# Patient Record
Sex: Female | Born: 1940 | Race: White | Hispanic: No | Smoking: Former smoker
Health system: Southern US, Community
[De-identification: ages and names within clinical notes are randomized; demographics above are authoritative.]

## PROBLEM LIST (undated history)

## (undated) DIAGNOSIS — F32A Depression, unspecified: Secondary | ICD-10-CM

## (undated) DIAGNOSIS — Z9289 Personal history of other medical treatment: Secondary | ICD-10-CM

## (undated) DIAGNOSIS — R0609 Other forms of dyspnea: Secondary | ICD-10-CM

## (undated) DIAGNOSIS — G4700 Insomnia, unspecified: Secondary | ICD-10-CM

## (undated) DIAGNOSIS — R195 Other fecal abnormalities: Secondary | ICD-10-CM

## (undated) DIAGNOSIS — M8580 Other specified disorders of bone density and structure, unspecified site: Secondary | ICD-10-CM

## (undated) DIAGNOSIS — F329 Major depressive disorder, single episode, unspecified: Secondary | ICD-10-CM

## (undated) DIAGNOSIS — D500 Iron deficiency anemia secondary to blood loss (chronic): Secondary | ICD-10-CM

## (undated) DIAGNOSIS — K635 Polyp of colon: Secondary | ICD-10-CM

## (undated) DIAGNOSIS — K5521 Angiodysplasia of colon with hemorrhage: Secondary | ICD-10-CM

## (undated) DIAGNOSIS — M1990 Unspecified osteoarthritis, unspecified site: Secondary | ICD-10-CM

## (undated) DIAGNOSIS — R0600 Dyspnea, unspecified: Secondary | ICD-10-CM

## (undated) DIAGNOSIS — I770 Arteriovenous fistula, acquired: Secondary | ICD-10-CM

## (undated) DIAGNOSIS — J189 Pneumonia, unspecified organism: Secondary | ICD-10-CM

## (undated) DIAGNOSIS — N289 Disorder of kidney and ureter, unspecified: Secondary | ICD-10-CM

## (undated) DIAGNOSIS — I10 Essential (primary) hypertension: Secondary | ICD-10-CM

## (undated) DIAGNOSIS — J449 Chronic obstructive pulmonary disease, unspecified: Secondary | ICD-10-CM

## (undated) DIAGNOSIS — Q2730 Arteriovenous malformation, site unspecified: Secondary | ICD-10-CM

## (undated) DIAGNOSIS — E785 Hyperlipidemia, unspecified: Secondary | ICD-10-CM

## (undated) DIAGNOSIS — D649 Anemia, unspecified: Secondary | ICD-10-CM

## (undated) DIAGNOSIS — E119 Type 2 diabetes mellitus without complications: Secondary | ICD-10-CM

## (undated) HISTORY — DX: Unspecified osteoarthritis, unspecified site: M19.90

## (undated) HISTORY — DX: Depression, unspecified: F32.A

## (undated) HISTORY — DX: Disorder of kidney and ureter, unspecified: N28.9

## (undated) HISTORY — DX: Arteriovenous fistula, acquired: I77.0

## (undated) HISTORY — DX: Type 2 diabetes mellitus without complications: E11.9

## (undated) HISTORY — PX: HYSTEROSCOPY: SHX211

## (undated) HISTORY — DX: Arteriovenous malformation, site unspecified: Q27.30

## (undated) HISTORY — DX: Insomnia, unspecified: G47.00

## (undated) HISTORY — DX: Dyspnea, unspecified: R06.00

## (undated) HISTORY — PX: APPENDECTOMY: SHX54

## (undated) HISTORY — PX: CYSTECTOMY: SUR359

## (undated) HISTORY — DX: Polyp of colon: K63.5

## (undated) HISTORY — DX: Other specified disorders of bone density and structure, unspecified site: M85.80

## (undated) HISTORY — DX: Angiodysplasia of colon with hemorrhage: K55.21

## (undated) HISTORY — DX: Other fecal abnormalities: R19.5

## (undated) HISTORY — DX: Chronic obstructive pulmonary disease, unspecified: J44.9

## (undated) HISTORY — DX: Other forms of dyspnea: R06.09

## (undated) HISTORY — DX: Iron deficiency anemia secondary to blood loss (chronic): D50.0

## (undated) HISTORY — DX: Essential (primary) hypertension: I10

## (undated) HISTORY — PX: AV FISTULA PLACEMENT: SHX1204

## (undated) HISTORY — DX: Anemia, unspecified: D64.9

## (undated) HISTORY — DX: Hyperlipidemia, unspecified: E78.5

---
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
## 2015-12-22 DIAGNOSIS — M8580 Other specified disorders of bone density and structure, unspecified site: Secondary | ICD-10-CM | POA: Insufficient documentation

## 2015-12-22 DIAGNOSIS — Z794 Long term (current) use of insulin: Secondary | ICD-10-CM

## 2015-12-22 DIAGNOSIS — Z79899 Other long term (current) drug therapy: Secondary | ICD-10-CM

## 2015-12-22 DIAGNOSIS — M1991 Primary osteoarthritis, unspecified site: Secondary | ICD-10-CM

## 2015-12-22 DIAGNOSIS — K5909 Other constipation: Secondary | ICD-10-CM | POA: Insufficient documentation

## 2015-12-22 DIAGNOSIS — G4700 Insomnia, unspecified: Secondary | ICD-10-CM | POA: Insufficient documentation

## 2015-12-22 DIAGNOSIS — K635 Polyp of colon: Secondary | ICD-10-CM | POA: Insufficient documentation

## 2015-12-22 DIAGNOSIS — F339 Major depressive disorder, recurrent, unspecified: Secondary | ICD-10-CM | POA: Insufficient documentation

## 2015-12-22 DIAGNOSIS — E785 Hyperlipidemia, unspecified: Secondary | ICD-10-CM

## 2015-12-22 HISTORY — DX: Other long term (current) drug therapy: Z79.899

## 2015-12-22 HISTORY — DX: Other constipation: K59.09

## 2015-12-22 HISTORY — DX: Primary osteoarthritis, unspecified site: M19.91

## 2015-12-22 HISTORY — DX: Hyperlipidemia, unspecified: E78.5

## 2015-12-22 HISTORY — DX: Major depressive disorder, recurrent, unspecified: F33.9

## 2015-12-22 HISTORY — DX: Long term (current) use of insulin: Z79.4

## 2015-12-23 DIAGNOSIS — R5383 Other fatigue: Secondary | ICD-10-CM | POA: Insufficient documentation

## 2015-12-23 DIAGNOSIS — R5381 Other malaise: Secondary | ICD-10-CM | POA: Insufficient documentation

## 2015-12-23 DIAGNOSIS — E785 Hyperlipidemia, unspecified: Secondary | ICD-10-CM | POA: Insufficient documentation

## 2015-12-23 DIAGNOSIS — N186 End stage renal disease: Secondary | ICD-10-CM

## 2015-12-23 DIAGNOSIS — E1169 Type 2 diabetes mellitus with other specified complication: Secondary | ICD-10-CM | POA: Insufficient documentation

## 2015-12-23 DIAGNOSIS — E1122 Type 2 diabetes mellitus with diabetic chronic kidney disease: Secondary | ICD-10-CM

## 2015-12-23 HISTORY — DX: End stage renal disease: E11.22

## 2015-12-23 HISTORY — DX: Other malaise: R53.81

## 2015-12-23 HISTORY — DX: Other malaise: R53.83

## 2015-12-23 HISTORY — DX: Type 2 diabetes mellitus with diabetic chronic kidney disease: N18.6

## 2017-01-14 DIAGNOSIS — E785 Hyperlipidemia, unspecified: Secondary | ICD-10-CM | POA: Diagnosis not present

## 2017-01-14 DIAGNOSIS — M1990 Unspecified osteoarthritis, unspecified site: Secondary | ICD-10-CM | POA: Diagnosis not present

## 2017-01-14 DIAGNOSIS — E119 Type 2 diabetes mellitus without complications: Secondary | ICD-10-CM

## 2017-01-14 DIAGNOSIS — J159 Unspecified bacterial pneumonia: Secondary | ICD-10-CM | POA: Diagnosis not present

## 2017-01-14 DIAGNOSIS — J441 Chronic obstructive pulmonary disease with (acute) exacerbation: Secondary | ICD-10-CM | POA: Diagnosis not present

## 2017-01-14 DIAGNOSIS — I10 Essential (primary) hypertension: Secondary | ICD-10-CM | POA: Diagnosis not present

## 2017-01-14 DIAGNOSIS — E871 Hypo-osmolality and hyponatremia: Secondary | ICD-10-CM | POA: Diagnosis not present

## 2017-01-14 DIAGNOSIS — J9601 Acute respiratory failure with hypoxia: Secondary | ICD-10-CM | POA: Diagnosis not present

## 2017-01-15 DIAGNOSIS — J9601 Acute respiratory failure with hypoxia: Secondary | ICD-10-CM | POA: Diagnosis not present

## 2017-01-15 DIAGNOSIS — J441 Chronic obstructive pulmonary disease with (acute) exacerbation: Secondary | ICD-10-CM | POA: Diagnosis not present

## 2017-01-15 DIAGNOSIS — E871 Hypo-osmolality and hyponatremia: Secondary | ICD-10-CM | POA: Diagnosis not present

## 2017-01-15 DIAGNOSIS — J159 Unspecified bacterial pneumonia: Secondary | ICD-10-CM | POA: Diagnosis not present

## 2017-01-16 DIAGNOSIS — J159 Unspecified bacterial pneumonia: Secondary | ICD-10-CM | POA: Diagnosis not present

## 2017-01-16 DIAGNOSIS — E871 Hypo-osmolality and hyponatremia: Secondary | ICD-10-CM | POA: Diagnosis not present

## 2017-01-16 DIAGNOSIS — J441 Chronic obstructive pulmonary disease with (acute) exacerbation: Secondary | ICD-10-CM | POA: Diagnosis not present

## 2017-01-16 DIAGNOSIS — J9601 Acute respiratory failure with hypoxia: Secondary | ICD-10-CM | POA: Diagnosis not present

## 2017-01-17 DIAGNOSIS — J159 Unspecified bacterial pneumonia: Secondary | ICD-10-CM | POA: Diagnosis not present

## 2017-01-17 DIAGNOSIS — E871 Hypo-osmolality and hyponatremia: Secondary | ICD-10-CM | POA: Diagnosis not present

## 2017-01-17 DIAGNOSIS — J441 Chronic obstructive pulmonary disease with (acute) exacerbation: Secondary | ICD-10-CM | POA: Diagnosis not present

## 2017-01-17 DIAGNOSIS — J9601 Acute respiratory failure with hypoxia: Secondary | ICD-10-CM | POA: Diagnosis not present

## 2017-01-18 DIAGNOSIS — J9601 Acute respiratory failure with hypoxia: Secondary | ICD-10-CM | POA: Diagnosis not present

## 2017-01-18 DIAGNOSIS — J441 Chronic obstructive pulmonary disease with (acute) exacerbation: Secondary | ICD-10-CM | POA: Diagnosis not present

## 2017-01-18 DIAGNOSIS — J159 Unspecified bacterial pneumonia: Secondary | ICD-10-CM | POA: Diagnosis not present

## 2017-01-18 DIAGNOSIS — E871 Hypo-osmolality and hyponatremia: Secondary | ICD-10-CM | POA: Diagnosis not present

## 2017-01-19 DIAGNOSIS — E871 Hypo-osmolality and hyponatremia: Secondary | ICD-10-CM | POA: Diagnosis not present

## 2017-01-19 DIAGNOSIS — J441 Chronic obstructive pulmonary disease with (acute) exacerbation: Secondary | ICD-10-CM | POA: Diagnosis not present

## 2017-01-19 DIAGNOSIS — J9601 Acute respiratory failure with hypoxia: Secondary | ICD-10-CM | POA: Diagnosis not present

## 2017-01-19 DIAGNOSIS — J159 Unspecified bacterial pneumonia: Secondary | ICD-10-CM | POA: Diagnosis not present

## 2017-01-23 DIAGNOSIS — D631 Anemia in chronic kidney disease: Secondary | ICD-10-CM

## 2017-01-23 DIAGNOSIS — D638 Anemia in other chronic diseases classified elsewhere: Secondary | ICD-10-CM

## 2017-01-23 DIAGNOSIS — N189 Chronic kidney disease, unspecified: Secondary | ICD-10-CM | POA: Insufficient documentation

## 2017-01-23 HISTORY — DX: Anemia in other chronic diseases classified elsewhere: D63.8

## 2017-01-23 HISTORY — DX: Chronic kidney disease, unspecified: D63.1

## 2017-02-02 DIAGNOSIS — I5030 Unspecified diastolic (congestive) heart failure: Secondary | ICD-10-CM

## 2017-02-02 HISTORY — DX: Unspecified diastolic (congestive) heart failure: I50.30

## 2017-02-17 DIAGNOSIS — J370 Chronic laryngitis: Secondary | ICD-10-CM | POA: Insufficient documentation

## 2017-02-17 HISTORY — DX: Chronic laryngitis: J37.0

## 2018-09-15 DIAGNOSIS — I120 Hypertensive chronic kidney disease with stage 5 chronic kidney disease or end stage renal disease: Secondary | ICD-10-CM

## 2018-09-15 HISTORY — DX: Chronic kidney disease, stage 5: I12.0

## 2018-12-23 ENCOUNTER — Encounter (INDEPENDENT_AMBULATORY_CARE_PROVIDER_SITE_OTHER): Payer: Medicare Other | Admitting: Ophthalmology

## 2018-12-23 DIAGNOSIS — E11311 Type 2 diabetes mellitus with unspecified diabetic retinopathy with macular edema: Secondary | ICD-10-CM

## 2018-12-23 DIAGNOSIS — H2513 Age-related nuclear cataract, bilateral: Secondary | ICD-10-CM

## 2018-12-23 DIAGNOSIS — E113513 Type 2 diabetes mellitus with proliferative diabetic retinopathy with macular edema, bilateral: Secondary | ICD-10-CM | POA: Diagnosis not present

## 2018-12-23 DIAGNOSIS — H35033 Hypertensive retinopathy, bilateral: Secondary | ICD-10-CM

## 2018-12-23 DIAGNOSIS — I10 Essential (primary) hypertension: Secondary | ICD-10-CM

## 2018-12-23 DIAGNOSIS — H4312 Vitreous hemorrhage, left eye: Secondary | ICD-10-CM

## 2018-12-23 DIAGNOSIS — H43813 Vitreous degeneration, bilateral: Secondary | ICD-10-CM

## 2019-01-19 ENCOUNTER — Encounter (INDEPENDENT_AMBULATORY_CARE_PROVIDER_SITE_OTHER): Payer: Medicare Other | Admitting: Ophthalmology

## 2019-02-16 ENCOUNTER — Encounter (INDEPENDENT_AMBULATORY_CARE_PROVIDER_SITE_OTHER): Payer: Medicare Other | Admitting: Ophthalmology

## 2019-03-06 ENCOUNTER — Encounter (INDEPENDENT_AMBULATORY_CARE_PROVIDER_SITE_OTHER): Payer: Medicare Other | Admitting: Ophthalmology

## 2019-03-11 DIAGNOSIS — Z794 Long term (current) use of insulin: Secondary | ICD-10-CM | POA: Insufficient documentation

## 2019-03-11 DIAGNOSIS — E871 Hypo-osmolality and hyponatremia: Secondary | ICD-10-CM | POA: Insufficient documentation

## 2019-03-11 DIAGNOSIS — I5033 Acute on chronic diastolic (congestive) heart failure: Secondary | ICD-10-CM

## 2019-03-11 DIAGNOSIS — E119 Type 2 diabetes mellitus without complications: Secondary | ICD-10-CM | POA: Insufficient documentation

## 2019-03-11 DIAGNOSIS — E876 Hypokalemia: Secondary | ICD-10-CM

## 2019-03-11 DIAGNOSIS — R0902 Hypoxemia: Secondary | ICD-10-CM

## 2019-03-11 HISTORY — DX: Acute on chronic diastolic (congestive) heart failure: I50.33

## 2019-03-11 HISTORY — DX: Type 2 diabetes mellitus without complications: E11.9

## 2019-03-11 HISTORY — DX: Hypoxemia: R09.02

## 2019-03-11 HISTORY — DX: Hypokalemia: E87.6

## 2019-03-11 HISTORY — DX: Hypo-osmolality and hyponatremia: E87.1

## 2019-03-12 DIAGNOSIS — I5021 Acute systolic (congestive) heart failure: Secondary | ICD-10-CM

## 2019-03-12 HISTORY — DX: Acute systolic (congestive) heart failure: I50.21

## 2019-03-29 DIAGNOSIS — E222 Syndrome of inappropriate secretion of antidiuretic hormone: Secondary | ICD-10-CM

## 2019-03-29 HISTORY — DX: Syndrome of inappropriate secretion of antidiuretic hormone: E22.2

## 2019-04-19 ENCOUNTER — Encounter (INDEPENDENT_AMBULATORY_CARE_PROVIDER_SITE_OTHER): Payer: Medicare Other | Admitting: Ophthalmology

## 2019-04-19 ENCOUNTER — Other Ambulatory Visit: Payer: Self-pay

## 2019-04-19 DIAGNOSIS — E113511 Type 2 diabetes mellitus with proliferative diabetic retinopathy with macular edema, right eye: Secondary | ICD-10-CM | POA: Diagnosis not present

## 2019-04-19 DIAGNOSIS — H35033 Hypertensive retinopathy, bilateral: Secondary | ICD-10-CM

## 2019-04-19 DIAGNOSIS — I10 Essential (primary) hypertension: Secondary | ICD-10-CM | POA: Diagnosis not present

## 2019-04-19 DIAGNOSIS — E11311 Type 2 diabetes mellitus with unspecified diabetic retinopathy with macular edema: Secondary | ICD-10-CM | POA: Diagnosis not present

## 2019-04-19 DIAGNOSIS — H43813 Vitreous degeneration, bilateral: Secondary | ICD-10-CM

## 2019-04-19 DIAGNOSIS — E113592 Type 2 diabetes mellitus with proliferative diabetic retinopathy without macular edema, left eye: Secondary | ICD-10-CM

## 2019-05-01 ENCOUNTER — Encounter (INDEPENDENT_AMBULATORY_CARE_PROVIDER_SITE_OTHER): Payer: Medicare Other | Admitting: Ophthalmology

## 2019-05-01 ENCOUNTER — Other Ambulatory Visit: Payer: Self-pay

## 2019-05-01 DIAGNOSIS — E113592 Type 2 diabetes mellitus with proliferative diabetic retinopathy without macular edema, left eye: Secondary | ICD-10-CM | POA: Diagnosis not present

## 2019-05-01 DIAGNOSIS — E11319 Type 2 diabetes mellitus with unspecified diabetic retinopathy without macular edema: Secondary | ICD-10-CM

## 2019-05-17 ENCOUNTER — Encounter (INDEPENDENT_AMBULATORY_CARE_PROVIDER_SITE_OTHER): Payer: Medicare Other | Admitting: Ophthalmology

## 2019-05-17 ENCOUNTER — Other Ambulatory Visit: Payer: Self-pay

## 2019-05-17 DIAGNOSIS — I10 Essential (primary) hypertension: Secondary | ICD-10-CM | POA: Diagnosis not present

## 2019-05-17 DIAGNOSIS — H35033 Hypertensive retinopathy, bilateral: Secondary | ICD-10-CM

## 2019-05-17 DIAGNOSIS — E113513 Type 2 diabetes mellitus with proliferative diabetic retinopathy with macular edema, bilateral: Secondary | ICD-10-CM

## 2019-05-17 DIAGNOSIS — H43813 Vitreous degeneration, bilateral: Secondary | ICD-10-CM

## 2019-05-17 DIAGNOSIS — E11311 Type 2 diabetes mellitus with unspecified diabetic retinopathy with macular edema: Secondary | ICD-10-CM

## 2019-06-13 ENCOUNTER — Encounter (INDEPENDENT_AMBULATORY_CARE_PROVIDER_SITE_OTHER): Payer: Medicare Other | Admitting: Ophthalmology

## 2019-06-21 ENCOUNTER — Encounter (INDEPENDENT_AMBULATORY_CARE_PROVIDER_SITE_OTHER): Payer: Medicare Other | Admitting: Ophthalmology

## 2019-07-04 ENCOUNTER — Encounter (INDEPENDENT_AMBULATORY_CARE_PROVIDER_SITE_OTHER): Payer: Medicare Other | Admitting: Ophthalmology

## 2019-07-11 ENCOUNTER — Encounter (INDEPENDENT_AMBULATORY_CARE_PROVIDER_SITE_OTHER): Payer: Medicare Other | Admitting: Ophthalmology

## 2019-07-11 ENCOUNTER — Other Ambulatory Visit: Payer: Self-pay

## 2019-07-11 DIAGNOSIS — I10 Essential (primary) hypertension: Secondary | ICD-10-CM | POA: Diagnosis not present

## 2019-07-11 DIAGNOSIS — H35033 Hypertensive retinopathy, bilateral: Secondary | ICD-10-CM | POA: Diagnosis not present

## 2019-07-11 DIAGNOSIS — E11311 Type 2 diabetes mellitus with unspecified diabetic retinopathy with macular edema: Secondary | ICD-10-CM | POA: Diagnosis not present

## 2019-07-11 DIAGNOSIS — E113513 Type 2 diabetes mellitus with proliferative diabetic retinopathy with macular edema, bilateral: Secondary | ICD-10-CM | POA: Diagnosis not present

## 2019-07-11 DIAGNOSIS — H43813 Vitreous degeneration, bilateral: Secondary | ICD-10-CM

## 2019-07-18 DIAGNOSIS — N186 End stage renal disease: Secondary | ICD-10-CM

## 2019-07-18 DIAGNOSIS — N185 Chronic kidney disease, stage 5: Secondary | ICD-10-CM

## 2019-07-18 HISTORY — DX: End stage renal disease: N18.6

## 2019-07-18 HISTORY — DX: Chronic kidney disease, stage 5: N18.5

## 2019-08-09 ENCOUNTER — Encounter (INDEPENDENT_AMBULATORY_CARE_PROVIDER_SITE_OTHER): Payer: Medicare Other | Admitting: Ophthalmology

## 2019-08-21 ENCOUNTER — Encounter (INDEPENDENT_AMBULATORY_CARE_PROVIDER_SITE_OTHER): Payer: Medicare Other | Admitting: Ophthalmology

## 2019-08-28 ENCOUNTER — Other Ambulatory Visit: Payer: Self-pay

## 2019-08-28 ENCOUNTER — Encounter (INDEPENDENT_AMBULATORY_CARE_PROVIDER_SITE_OTHER): Payer: Medicare Other | Admitting: Ophthalmology

## 2019-08-28 DIAGNOSIS — H43813 Vitreous degeneration, bilateral: Secondary | ICD-10-CM

## 2019-08-28 DIAGNOSIS — I10 Essential (primary) hypertension: Secondary | ICD-10-CM | POA: Diagnosis not present

## 2019-08-28 DIAGNOSIS — E113513 Type 2 diabetes mellitus with proliferative diabetic retinopathy with macular edema, bilateral: Secondary | ICD-10-CM

## 2019-08-28 DIAGNOSIS — E11311 Type 2 diabetes mellitus with unspecified diabetic retinopathy with macular edema: Secondary | ICD-10-CM

## 2019-08-28 DIAGNOSIS — H35033 Hypertensive retinopathy, bilateral: Secondary | ICD-10-CM

## 2019-09-05 ENCOUNTER — Other Ambulatory Visit: Payer: Self-pay

## 2019-09-05 ENCOUNTER — Encounter (INDEPENDENT_AMBULATORY_CARE_PROVIDER_SITE_OTHER): Payer: Medicare Other | Admitting: Ophthalmology

## 2019-09-05 DIAGNOSIS — E11311 Type 2 diabetes mellitus with unspecified diabetic retinopathy with macular edema: Secondary | ICD-10-CM | POA: Diagnosis not present

## 2019-09-05 DIAGNOSIS — E113511 Type 2 diabetes mellitus with proliferative diabetic retinopathy with macular edema, right eye: Secondary | ICD-10-CM

## 2019-09-18 DIAGNOSIS — N186 End stage renal disease: Secondary | ICD-10-CM

## 2019-09-18 HISTORY — DX: End stage renal disease: N18.6

## 2019-09-25 ENCOUNTER — Encounter (INDEPENDENT_AMBULATORY_CARE_PROVIDER_SITE_OTHER): Payer: Medicare Other | Admitting: Ophthalmology

## 2019-09-25 ENCOUNTER — Other Ambulatory Visit: Payer: Self-pay

## 2019-09-25 DIAGNOSIS — I10 Essential (primary) hypertension: Secondary | ICD-10-CM | POA: Diagnosis not present

## 2019-09-25 DIAGNOSIS — E113513 Type 2 diabetes mellitus with proliferative diabetic retinopathy with macular edema, bilateral: Secondary | ICD-10-CM | POA: Diagnosis not present

## 2019-09-25 DIAGNOSIS — H35033 Hypertensive retinopathy, bilateral: Secondary | ICD-10-CM

## 2019-09-25 DIAGNOSIS — E11311 Type 2 diabetes mellitus with unspecified diabetic retinopathy with macular edema: Secondary | ICD-10-CM

## 2019-09-25 DIAGNOSIS — H43813 Vitreous degeneration, bilateral: Secondary | ICD-10-CM

## 2019-09-25 DIAGNOSIS — H2513 Age-related nuclear cataract, bilateral: Secondary | ICD-10-CM

## 2019-10-30 ENCOUNTER — Encounter (INDEPENDENT_AMBULATORY_CARE_PROVIDER_SITE_OTHER): Payer: Medicare Other | Admitting: Ophthalmology

## 2019-11-02 DIAGNOSIS — R0600 Dyspnea, unspecified: Secondary | ICD-10-CM | POA: Insufficient documentation

## 2019-11-02 DIAGNOSIS — I770 Arteriovenous fistula, acquired: Secondary | ICD-10-CM

## 2019-11-02 HISTORY — DX: Dyspnea, unspecified: R06.00

## 2019-11-02 HISTORY — DX: Arteriovenous fistula, acquired: I77.0

## 2019-11-08 ENCOUNTER — Encounter (INDEPENDENT_AMBULATORY_CARE_PROVIDER_SITE_OTHER): Payer: Medicare Other | Admitting: Ophthalmology

## 2019-11-08 ENCOUNTER — Other Ambulatory Visit: Payer: Self-pay

## 2019-11-08 DIAGNOSIS — I10 Essential (primary) hypertension: Secondary | ICD-10-CM | POA: Diagnosis not present

## 2019-11-08 DIAGNOSIS — H43813 Vitreous degeneration, bilateral: Secondary | ICD-10-CM

## 2019-11-08 DIAGNOSIS — H35033 Hypertensive retinopathy, bilateral: Secondary | ICD-10-CM

## 2019-11-08 DIAGNOSIS — E11311 Type 2 diabetes mellitus with unspecified diabetic retinopathy with macular edema: Secondary | ICD-10-CM | POA: Diagnosis not present

## 2019-11-08 DIAGNOSIS — E113513 Type 2 diabetes mellitus with proliferative diabetic retinopathy with macular edema, bilateral: Secondary | ICD-10-CM

## 2019-11-13 ENCOUNTER — Ambulatory Visit: Payer: Medicare Other | Admitting: Cardiology

## 2019-11-13 ENCOUNTER — Ambulatory Visit (INDEPENDENT_AMBULATORY_CARE_PROVIDER_SITE_OTHER): Payer: Medicare Other | Admitting: Cardiology

## 2019-11-13 ENCOUNTER — Encounter: Payer: Self-pay | Admitting: *Deleted

## 2019-11-13 VITALS — BP 166/60 | HR 69 | Ht 62.0 in | Wt 165.0 lb

## 2019-11-13 DIAGNOSIS — E8779 Other fluid overload: Secondary | ICD-10-CM

## 2019-11-13 DIAGNOSIS — N185 Chronic kidney disease, stage 5: Secondary | ICD-10-CM | POA: Diagnosis not present

## 2019-11-13 DIAGNOSIS — I5033 Acute on chronic diastolic (congestive) heart failure: Secondary | ICD-10-CM | POA: Diagnosis not present

## 2019-11-13 DIAGNOSIS — E8770 Fluid overload, unspecified: Secondary | ICD-10-CM | POA: Insufficient documentation

## 2019-11-13 HISTORY — DX: Fluid overload, unspecified: E87.70

## 2019-11-13 NOTE — Progress Notes (Signed)
Cardiology Office Note:    Date:  11/13/2019   ID:  Corky Downs, DOB February 10, 1941, MRN [id number]  PCP:  Raina Mina., MD  Cardiologist:  Berniece Salines, DO  Electrophysiologist:  None   Referring MD: Gweneth Fritter, FNP   Chief Complaint  Patient presents with  . New Patient (Initial Visit)    CHF    History of Present Illness:    Anna Carr is a 79 y.o. female with a hx of hypertension, diabetes, diastolic heart failure echocardiogram in May 2020 reports a EF of 55-60% with noted grade 2 diastolic dysfunction, chronic kidney disease not on dialysis but has a new fistula that has not been used. The patient was recently treated at the ER at [facility].  Per PCP note she was given 80 Lasix IV and later she did get Bumex she was diuresed over 600 cc prior to her discharge. During her visit with her PCP was recommended the patient see cardiology.  She is here today and tells me that she has been experiencing significant shortness of breath, orthopnea and PND.  She has had worsening leg edema.  She was started on Lasix 80 mg daily which had been eventually titrated up to Lasix 80 mg every 8 hours and she is getting no relief.  Her shortness of breath is getting worse. Denies any chest pain, lightheadedness dizziness or nausea.  Past Medical History:  Diagnosis Date  . Anemia   . AV fistula ([place])   . Colon polyp   . COPD mixed type ([place])   . Depression   . Dyspnea on exertion   . Hyperlipidemia   . Hypertension   . Insomnia   . Kidney disease   . Osteoarthritis   . Osteopenia   . Type 2 diabetes mellitus ([place])     Past Surgical History:  Procedure Laterality Date  . APPENDECTOMY    . AV FISTULA PLACEMENT Left   . HYSTEROSCOPY      Current Medications: Current Meds  Medication Sig  . acetaminophen (TYLENOL) 500 MG tablet Take by mouth.  Marland Kitchen amLODipine (NORVASC) 10 MG tablet TAKE 1 TABLET BY MOUTH  DAILY  . ascorbic acid (VITAMIN C) 500 MG  tablet Take by mouth.  Marland Kitchen aspirin 81 MG EC tablet Take by mouth.  Marland Kitchen BACILLUS COAGULANS-INULIN PO Takes one capsule daily  . bisacodyl (DULCOLAX) 5 MG EC tablet Take by mouth.  . carvedilol (COREG) 6.25 MG tablet Take by mouth.  . Cholecalciferol 1.25 MG (50000 UT) TABS Take by mouth.  . cyanocobalamin 50 MCG tablet Take by mouth.  . furosemide (LASIX) 80 MG tablet Take 80 mg by mouth twice daily or as directed..  . glimepiride (AMARYL) 4 MG tablet Take by mouth 2 (two) times daily as needed.   . hydrALAZINE (APRESOLINE) 100 MG tablet Take by mouth.  . Insulin Glargine-Lixisenatide (SOLIQUA) 100-33 UNT-MCG/ML SOPN Inject 17 units once a day with additional as needed.  MDD: 50 units/day  . Omega-3 Fatty Acids (FISH OIL) 1000 MG CAPS 2 times daily.  . potassium chloride SA (KLOR-CON) 20 MEQ tablet Take by mouth.  . psyllium (REGULOID) 0.52 g capsule Take by mouth.  . Vitamin E 400 units TABS Take by mouth.     Allergies:   Patient has no known allergies.   Social History   Socioeconomic History  . Marital status: Widowed    Spouse name: Not on file  . Number of children: Not on file  .  Years of education: Not on file  . Highest education level: Not on file  Occupational History  . Not on file  Tobacco Use  . Smoking status: Former Smoker    Types: Cigarettes  . Smokeless tobacco: Never Used  Substance and Sexual Activity  . Alcohol use: Not Currently  . Drug use: Never  . Sexual activity: Not on file  Other Topics Concern  . Not on file  Social History Narrative  . Not on file   Social Determinants of Health   Financial Resource Strain:   . Difficulty of Paying Living Expenses: Not on file  Food Insecurity:   . Worried About Charity fundraiser in the Last Year: Not on file  . Ran Out of Food in the Last Year: Not on file  Transportation Needs:   . Lack of Transportation (Medical): Not on file  . Lack of Transportation (Non-Medical): Not on file  Physical Activity:    . Days of Exercise per Week: Not on file  . Minutes of Exercise per Session: Not on file  Stress:   . Feeling of Stress : Not on file  Social Connections:   . Frequency of Communication with Friends and Family: Not on file  . Frequency of Social Gatherings with Friends and Family: Not on file  . Attends Religious Services: Not on file  . Active Member of Clubs or Organizations: Not on file  . Attends Archivist Meetings: Not on file  . Marital Status: Not on file     Family History: The patient's family history includes Diabetes in her maternal grandfather and mother; Hypertension in her mother.  ROS:   Review of Systems  Constitution: Negative for decreased appetite, fever and weight gain.  HENT: Negative for congestion, ear discharge, hoarse voice and sore throat.   Eyes: Negative for discharge, redness, vision loss in right eye and visual halos.  Cardiovascular: Negative for chest pain, dyspnea on exertion, leg swelling, orthopnea and palpitations.  Respiratory: Negative for cough, hemoptysis, shortness of breath and snoring.   Endocrine: Negative for heat intolerance and polyphagia.  Hematologic/Lymphatic: Negative for bleeding problem. Does not bruise/bleed easily.  Skin: Negative for flushing, nail changes, rash and suspicious lesions.  Musculoskeletal: Negative for arthritis, joint pain, muscle cramps, myalgias, neck pain and stiffness.  Gastrointestinal: Negative for abdominal pain, bowel incontinence, diarrhea and excessive appetite.  Genitourinary: Negative for decreased libido, genital sores and incomplete emptying.  Neurological: Negative for brief paralysis, focal weakness, headaches and loss of balance.  Psychiatric/Behavioral: Negative for altered mental status, depression and suicidal ideas.  Allergic/Immunologic: Negative for HIV exposure and persistent infections.    EKGs/Labs/Other Studies Reviewed:    The following studies were reviewed today:    EKG:  The ekg ordered today demonstrates sinus rhythm, heart rate 69 bpm with nonspecific ST changes, no prior EKG for comparison.  Per PCP note she had an echocardiogram done in May 2020 which showed evidence of ejection fraction 55 to 60%.  She had normal right ventricular systolic function.  Grade 2 diastolic dysfunction.  There was evidence of mitral annular calcification with mild mitral vegetation.  Dilated left atrium as well as aortic sclerosis with mild to moderate pulmonary artery systolic pressure reported.  The images are unavailable at this time.  Recent Labs: No results found for requested labs within last 8760 hours.  Recent Lipid Panel No results found for: CHOL, TRIG, HDL, CHOLHDL, VLDL, LDLCALC, LDLDIRECT  Physical Exam:    VS:  BP (!) 166/60   Pulse 69   Ht 5\' 2"  (1.575 m)   Wt 165 lb (74.8 kg)   SpO2 95%   BMI 30.18 kg/m     Wt Readings from Last 3 Encounters:  11/13/19 165 lb (74.8 kg)     GEN: Well nourished, well developed in no acute distress HEENT: Normal NECK: JVD noted,  No carotid bruits LYMPHATICS: No lymphadenopathy CARDIAC: S1S2 noted,RRR, no murmurs, rubs, gallops RESPIRATORY: Bibasilar crackles ABDOMEN: Soft, non-tender, non-distended, +bowel sounds, no guarding. EXTREMITIES: bilateral +2 edema, No cyanosis, no clubbing MUSCULOSKELETAL:  No edema; No deformity  SKIN: Warm and dry NEUROLOGIC:  Alert and oriented x 3, non-focal PSYCHIATRIC:  Normal affect, good insight  ASSESSMENT:    1. Acute on chronic diastolic congestive heart failure ([place])   2. Volume overload   3. CKD (chronic kidney disease), stage V ([name])    PLAN:    Likely she does appear volume overloaded.  Which could be in the setting of her chronic kidney disease first as well as her diastolic heart failure.  At this point she has failed outpatient diuretic therapy.  I do suggest that the patient be admitted for inpatient IV diuretics.  Ideally what she needs is he hemodialysis  as she is significantly overloaded.  I spoken to the patient and her daughter about this.  I asked the patient to go to the [facility] emergency department but she has deferred and preferred to go to [facility] ED.   She certainly will need a echocardiogram as well as an ischemic evaluation but for now getting the patient euvolemic is prior to and this was cussed with the patient and her daughter.   The patient is in agreement with the above plan. The patient left the office in stable condition.  The patient will follow up in 2 weeks or sooner if needed   Medication Adjustments/Labs and Tests Ordered: Current medicines are reviewed at length with the patient today.  Concerns regarding medicines are outlined above.  Orders Placed This Encounter  Procedures  . MYOCARDIAL PERFUSION IMAGING  . EKG 12-Lead  . ECHOCARDIOGRAM COMPLETE   No orders of the defined types were placed in this encounter.   Patient Instructions  Medication Instructions:  Your physician recommends that you continue on your current medications as directed. Please refer to the Current Medication list given to you today.  *If you need a refill on your cardiac medications before your next appointment, please call your pharmacy*  Lab Work: None If you have labs (blood work) drawn today and your tests are completely normal, you will receive your results only by: Marland Kitchen MyChart Message (if you have MyChart) OR . A paper copy in the mail If you have any lab test that is abnormal or we need to change your treatment, we will call you to review the results.  Testing/Procedures: Your physician has requested that you have an echocardiogram. Echocardiography is a painless test that uses sound waves to create images of your heart. It provides your doctor with information about the size and shape of your heart and how well your heart's chambers and valves are working. This procedure takes approximately one hour. There are no restrictions  for this procedure.  Your physician has requested that you have a lexiscan myoview. For further information please visit [email or web address]. Please follow instruction sheet, as given.   Follow-Up: At [facility], you and your health needs are our priority.  As part of our continuing mission to  provide you with exceptional heart care, we have created designated Provider Care Teams.  These Care Teams include your primary Cardiologist (physician) and Advanced Practice Providers (APPs -  Physician Assistants and Nurse Practitioners) who all work together to provide you with the care you need, when you need it.  Your next appointment:   2 week(s)  The format for your next appointment:   In Person  Provider:   Berniece Salines, DO  Other Instructions      Adopting a Healthy Lifestyle.  Know what a healthy weight is for you (roughly BMI <25) and aim to maintain this   Aim for 7+ servings of fruits and vegetables daily   65-80+ fluid ounces of water or unsweet tea for healthy kidneys   Limit to max 1 drink of alcohol per day; avoid smoking/tobacco   Limit animal fats in diet for cholesterol and heart health - choose grass fed whenever available   Avoid highly processed foods, and foods high in saturated/trans fats   Aim for low stress - take time to unwind and care for your mental health   Aim for 150 min of moderate intensity exercise weekly for heart health, and weights twice weekly for bone health   Aim for 7-9 hours of sleep daily   When it comes to diets, agreement about the perfect plan isnt easy to find, even among the experts. Experts at the [employer] developed an idea known as the Healthy Eating Plate. Just imagine a plate divided into logical, healthy portions.   The emphasis is on diet quality:   Load up on vegetables and fruits - one-half of your plate: Aim for color and variety, and remember that potatoes dont count.   Go for whole grains -  one-quarter of your plate: Whole wheat, barley, wheat berries, quinoa, oats, brown rice, and foods made with them. If you want pasta, go with whole wheat pasta.   Protein power - one-quarter of your plate: Fish, chicken, beans, and nuts are all healthy, versatile protein sources. Limit red meat.   The diet, however, does go beyond the plate, offering a few other suggestions.   Use healthy plant oils, such as olive, canola, soy, corn, sunflower and peanut. Check the labels, and avoid partially hydrogenated oil, which have unhealthy trans fats.   If youre thirsty, drink water. Coffee and tea are good in moderation, but skip sugary drinks and limit milk and dairy products to one or two daily servings.   The type of carbohydrate in the diet is more important than the amount. Some sources of carbohydrates, such as vegetables, fruits, whole grains, and beans-are healthier than others.   Finally, stay active  Signed, Berniece Salines, DO  11/13/2019 11:49 AM    [place]

## 2019-11-13 NOTE — Patient Instructions (Addendum)
Medication Instructions:  Your physician recommends that you continue on your current medications as directed. Please refer to the Current Medication list given to you today.  *If you need a refill on your cardiac medications before your next appointment, please call your pharmacy*  Lab Work: None If you have labs (blood work) drawn today and your tests are completely normal, you will receive your results only by: Marland Kitchen MyChart Message (if you have MyChart) OR . A paper copy in the mail If you have any lab test that is abnormal or we need to change your treatment, we will call you to review the results.  Testing/Procedures: Your physician has requested that you have an echocardiogram. Echocardiography is a painless test that uses sound waves to create images of your heart. It provides your doctor with information about the size and shape of your heart and how well your heart's chambers and valves are working. This procedure takes approximately one hour. There are no restrictions for this procedure.  Your physician has requested that you have a lexiscan myoview. For further information please visit [email or web address]. Please follow instruction sheet, as given.   Follow-Up: At [facility], you and your health needs are our priority.  As part of our continuing mission to provide you with exceptional heart care, we have created designated Provider Care Teams.  These Care Teams include your primary Cardiologist (physician) and Advanced Practice Providers (APPs -  Physician Assistants and Nurse Practitioners) who all work together to provide you with the care you need, when you need it.  Your next appointment:   2 week(s)  The format for your next appointment:   In Person  Provider:   Berniece Salines, DO  Other Instructions

## 2019-11-14 DIAGNOSIS — I361 Nonrheumatic tricuspid (valve) insufficiency: Secondary | ICD-10-CM | POA: Diagnosis not present

## 2019-11-14 DIAGNOSIS — I5033 Acute on chronic diastolic (congestive) heart failure: Secondary | ICD-10-CM | POA: Diagnosis not present

## 2019-11-14 DIAGNOSIS — I340 Nonrheumatic mitral (valve) insufficiency: Secondary | ICD-10-CM | POA: Diagnosis not present

## 2019-11-15 DIAGNOSIS — J90 Pleural effusion, not elsewhere classified: Secondary | ICD-10-CM | POA: Diagnosis not present

## 2019-11-15 DIAGNOSIS — I120 Hypertensive chronic kidney disease with stage 5 chronic kidney disease or end stage renal disease: Secondary | ICD-10-CM | POA: Diagnosis not present

## 2019-11-15 DIAGNOSIS — D649 Anemia, unspecified: Secondary | ICD-10-CM | POA: Diagnosis not present

## 2019-11-15 DIAGNOSIS — I5030 Unspecified diastolic (congestive) heart failure: Secondary | ICD-10-CM | POA: Diagnosis not present

## 2019-11-16 DIAGNOSIS — J90 Pleural effusion, not elsewhere classified: Secondary | ICD-10-CM | POA: Diagnosis not present

## 2019-11-16 DIAGNOSIS — D649 Anemia, unspecified: Secondary | ICD-10-CM | POA: Diagnosis not present

## 2019-11-16 DIAGNOSIS — I120 Hypertensive chronic kidney disease with stage 5 chronic kidney disease or end stage renal disease: Secondary | ICD-10-CM | POA: Diagnosis not present

## 2019-11-16 DIAGNOSIS — I5030 Unspecified diastolic (congestive) heart failure: Secondary | ICD-10-CM | POA: Diagnosis not present

## 2019-11-16 DIAGNOSIS — I5033 Acute on chronic diastolic (congestive) heart failure: Secondary | ICD-10-CM | POA: Diagnosis not present

## 2019-11-17 DIAGNOSIS — D649 Anemia, unspecified: Secondary | ICD-10-CM | POA: Diagnosis not present

## 2019-11-17 DIAGNOSIS — E876 Hypokalemia: Secondary | ICD-10-CM

## 2019-11-17 DIAGNOSIS — I120 Hypertensive chronic kidney disease with stage 5 chronic kidney disease or end stage renal disease: Secondary | ICD-10-CM | POA: Diagnosis not present

## 2019-11-17 DIAGNOSIS — J90 Pleural effusion, not elsewhere classified: Secondary | ICD-10-CM | POA: Diagnosis not present

## 2019-11-17 DIAGNOSIS — I5033 Acute on chronic diastolic (congestive) heart failure: Secondary | ICD-10-CM | POA: Diagnosis not present

## 2019-11-17 DIAGNOSIS — I5030 Unspecified diastolic (congestive) heart failure: Secondary | ICD-10-CM | POA: Diagnosis not present

## 2019-11-22 ENCOUNTER — Ambulatory Visit (INDEPENDENT_AMBULATORY_CARE_PROVIDER_SITE_OTHER): Payer: Medicare Other | Admitting: Cardiology

## 2019-11-22 ENCOUNTER — Encounter: Payer: Self-pay | Admitting: Cardiology

## 2019-11-22 ENCOUNTER — Other Ambulatory Visit: Payer: Self-pay

## 2019-11-22 VITALS — BP 150/78 | HR 72 | Ht 62.0 in | Wt 152.2 lb

## 2019-11-22 DIAGNOSIS — I5032 Chronic diastolic (congestive) heart failure: Secondary | ICD-10-CM | POA: Insufficient documentation

## 2019-11-22 DIAGNOSIS — E782 Mixed hyperlipidemia: Secondary | ICD-10-CM | POA: Diagnosis not present

## 2019-11-22 DIAGNOSIS — Z794 Long term (current) use of insulin: Secondary | ICD-10-CM | POA: Diagnosis not present

## 2019-11-22 DIAGNOSIS — E1169 Type 2 diabetes mellitus with other specified complication: Secondary | ICD-10-CM | POA: Diagnosis not present

## 2019-11-22 HISTORY — DX: Chronic diastolic (congestive) heart failure: I50.32

## 2019-11-22 NOTE — Patient Instructions (Signed)
.  Medication Instructions:  Your physician recommends that you continue on your current medications as directed. Please refer to the Current Medication list given to you today.  *If you need a refill on your cardiac medications before your next appointment, please call your pharmacy*  Lab Work: None If you have labs (blood work) drawn today and your tests are completely normal, you will receive your results only by: Marland Kitchen MyChart Message (if you have MyChart) OR . A paper copy in the mail If you have any lab test that is abnormal or we need to change your treatment, we will call you to review the results.  Testing/Procedures: NOne  Follow-Up: At [facility], you and your health needs are our priority.  As part of our continuing mission to provide you with exceptional heart care, we have created designated Provider Care Teams.  These Care Teams include your primary Cardiologist (physician) and Advanced Practice Providers (APPs -  Physician Assistants and Nurse Practitioners) who all work together to provide you with the care you need, when you need it.  Your next appointment:   1 month(s)  The format for your next appointment:   In Person  Provider:   Berniece Salines, DO  Other Instructions

## 2019-11-22 NOTE — Progress Notes (Signed)
Cardiology Office Note:    Date:  11/22/2019   ID:  Anna, Carr 1941-01-12, MRN [id number]  PCP:  Raina Mina., MD  Cardiologist:  Berniece Salines, DO  Electrophysiologist:  None   Referring MD: Raina Mina., MD   Follow-up visit for diastolic heart failure  History of Present Illness:    Anna Carr is a 79 y.o. female with a hx of chronic diastolic heart failure, hypertension, grade 2 diastolic dysfunction, chronic kidney disease not on dialysis but has a new fistula which may be pending HD.  I did see the patient for the first time on November 12, 2018 at that time she reported that she had been treated in the ED at [facility] and was discharged home.  Post discharge she was placed on Lasix 80 mg every 8 hours but was getting no relief.  At the time of my evaluation the patient appeared clinically volume overloaded and needed IV diuretics.  I recommended that she go to the ED to be admitted for treatment for volume overload.  The patient then presented to the [facility] on the same day in which treated in the emergency department and subsequently admitted to the hospital.  While in the hospital she was aggressively diuresed.  She was status post thoracocentesis of the right lung during 1.2 L of pleural fluid.  She is here today for follow-up visit.  She reports that she is gaining her strength back but is still short of breath on exertion.  Past Medical History:  Diagnosis Date  . Anemia   . 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 Colon polyp   . COPD mixed type ([place])   . Depression   . Dyspnea on exertion   . Hyperlipidemia   . Hypertension   . Insomnia   . Kidney disease   . Osteoarthritis   . Osteopenia   . Type 2 diabetes mellitus ([place])     Past Surgical History:  Procedure Laterality Date  . APPENDECTOMY    . AV FISTULA PLACEMENT Left   . HYSTEROSCOPY      Current Medications: Current Meds  Medication Sig  . acetaminophen (TYLENOL) 500 MG  tablet Take by mouth.  Marland Kitchen albuterol (VENTOLIN HFA) 108 (90 Base) MCG/ACT inhaler Inhale into the lungs.  Marland Kitchen amLODipine (NORVASC) 10 MG tablet TAKE 1 TABLET BY MOUTH  DAILY  . aspirin 81 MG EC tablet Take by mouth.  Marland Kitchen BACILLUS COAGULANS-INULIN PO Takes one capsule daily  . bisacodyl (DULCOLAX) 5 MG EC tablet Take by mouth.  . carvedilol (COREG) 6.25 MG tablet Take by mouth.  . Cholecalciferol 1.25 MG (50000 UT) TABS Take by mouth.  . cyanocobalamin 50 MCG tablet Take by mouth.  . furosemide (LASIX) 80 MG tablet Take 80 mg by mouth twice daily or as directed..  . glimepiride (AMARYL) 4 MG tablet Take by mouth 2 (two) times daily as needed.   . hydrALAZINE (APRESOLINE) 100 MG tablet Take 100 mg by mouth 3 (three) times daily.   . Insulin Glargine-Lixisenatide (SOLIQUA) 100-33 UNT-MCG/ML SOPN Inject 17 units once a day with additional as needed.  MDD: 50 units/day  . metolazone (ZAROXOLYN) 2.5 MG tablet Take 2.5 mg by mouth daily.  . Omega-3 Fatty Acids (FISH OIL) 1000 MG CAPS 2 times daily.  . potassium chloride SA (KLOR-CON) 20 MEQ tablet Take 40 mEq by mouth 2 (two) times daily.   . psyllium (REGULOID) 0.52 g capsule Take by mouth.  . Vitamin  E 400 units TABS Take by mouth.     Allergies:   Patient has no known allergies.   Social History   Socioeconomic History  . Marital status: Widowed    Spouse name: Not on file  . Number of children: Not on file  . Years of education: Not on file  . Highest education level: Not on file  Occupational History  . Not on file  Tobacco Use  . Smoking status: Former Smoker    Types: Cigarettes  . Smokeless tobacco: Never Used  Substance and Sexual Activity  . Alcohol use: Not Currently  . Drug use: Never  . Sexual activity: Not on file  Other Topics Concern  . Not on file  Social History Narrative  . Not on file   Social Determinants of Health   Financial Resource Strain:   . Difficulty of Paying Living Expenses: Not on file  Food  Insecurity:   . Worried About Charity fundraiser in the Last Year: Not on file  . Ran Out of Food in the Last Year: Not on file  Transportation Needs:   . Lack of Transportation (Medical): Not on file  . Lack of Transportation (Non-Medical): Not on file  Physical Activity:   . Days of Exercise per Week: Not on file  . Minutes of Exercise per Session: Not on file  Stress:   . Feeling of Stress : Not on file  Social Connections:   . Frequency of Communication with Friends and Family: Not on file  . Frequency of Social Gatherings with Friends and Family: Not on file  . Attends Religious Services: Not on file  . Active Member of Clubs or Organizations: Not on file  . Attends Archivist Meetings: Not on file  . Marital Status: Not on file     Family History: The patient's family history includes Diabetes in her maternal grandfather and mother; Hypertension in her mother.  ROS:   Review of Systems  Constitution: Negative for decreased appetite, fever and weight gain.  HENT: Negative for congestion, ear discharge, hoarse voice and sore throat.   Eyes: Negative for discharge, redness, vision loss in right eye and visual halos.  Cardiovascular: Negative for chest pain, dyspnea on exertion, leg swelling, orthopnea and palpitations.  Respiratory: Negative for cough, hemoptysis, shortness of breath and snoring.   Endocrine: Negative for heat intolerance and polyphagia.  Hematologic/Lymphatic: Negative for bleeding problem. Does not bruise/bleed easily.  Skin: Negative for flushing, nail changes, rash and suspicious lesions.  Musculoskeletal: Negative for arthritis, joint pain, muscle cramps, myalgias, neck pain and stiffness.  Gastrointestinal: Negative for abdominal pain, bowel incontinence, diarrhea and excessive appetite.  Genitourinary: Negative for decreased libido, genital sores and incomplete emptying.  Neurological: Negative for brief paralysis, focal weakness, headaches  and loss of balance.  Psychiatric/Behavioral: Negative for altered mental status, depression and suicidal ideas.  Allergic/Immunologic: Negative for HIV exposure and persistent infections.    EKGs/Labs/Other Studies Reviewed:    The following studies were reviewed today:   EKG: None today  Transthoracic echocardiogram performed November 14, 2019 mild concentric left ventricle hypertrophy.  Left and systolic function 55 to [id number].  Grade 3 diastolic dysfunction.  Severely dilated left atrium.  Moderate calcification present.  Moderate mitral vegetation.  Moderate tricuspid vegetation.  Recent Labs: No results found for requested labs within last 8760 hours.  Recent Lipid Panel No results found for: CHOL, TRIG, HDL, CHOLHDL, VLDL, LDLCALC, LDLDIRECT  Physical Exam:    VS:  BP (!) 150/78 (BP Location: Right Arm)   Pulse 72   Ht 5\' 2"  (1.575 m)   Wt 152 lb 3.2 oz (69 kg)   BMI 27.84 kg/m     Wt Readings from Last 3 Encounters:  11/22/19 152 lb 3.2 oz (69 kg)  11/13/19 165 lb (74.8 kg)     GEN: Well nourished, well developed in no acute distress HEENT: Normal NECK: No JVD; No carotid bruits LYMPHATICS: No lymphadenopathy CARDIAC: S1S2 noted,RRR, no murmurs, rubs, gallops RESPIRATORY:  Clear to auscultation without rales, wheezing or rhonchi  ABDOMEN: Soft, non-tender, non-distended, +bowel sounds, no guarding. EXTREMITIES: No edema, No cyanosis, no clubbing MUSCULOSKELETAL:  No deformity  SKIN: Warm and dry NEUROLOGIC:  Alert and oriented x 3, non-focal PSYCHIATRIC:  Normal affect, good insight  ASSESSMENT:    1. Type 2 diabetes mellitus with other specified complication, with long-term current use of insulin ([place])   2. Mixed hyperlipidemia   3. Chronic diastolic heart failure ([name])    PLAN:    The patient appears significantly improved clinically.  I am happy with her improvement since her hospitalization.  She is currently on Lasix 80 mg twice a day.  With metolazone.   I have advised patient to take her metolazone 30 minutes prior to her first Lasix dose in the morning.  For now no changes will be made to her medication.  Have advised that her dry weight is 152 pounds.  She is to weigh herself daily and notify us if she gains 2 pounds in 3 days and 5 pounds in 1 week.  She also does have plans to see her nephrologist next week and she was told that she will be going to [facility] up in [place].  At this time no plans yet for starting hemodialysis.   I also educated the patient about decreasing her fluid intake as well as decreasing salt intake.  Hyperlipidemia-she prefers to take omega fatty oil for now.  We will repeat her lipid profile and advise on statin again as appropriate.  Diabetes mellitus-continue insulin regimen.  The patient is in agreement with the above plan. The patient left the office in stable condition.  The patient will follow up in 1 month or sooner if needed   Medication Adjustments/Labs and Tests Ordered: Current medicines are reviewed at length with the patient today.  Concerns regarding medicines are outlined above.  No orders of the defined types were placed in this encounter.  No orders of the defined types were placed in this encounter.   Patient Instructions  .Medication Instructions:  Your physician recommends that you continue on your current medications as directed. Please refer to the Current Medication list given to you today.  *If you need a refill on your cardiac medications before your next appointment, please call your pharmacy*  Lab Work: None If you have labs (blood work) drawn today and your tests are completely normal, you will receive your results only by: Marland Kitchen MyChart Message (if you have MyChart) OR . A paper copy in the mail If you have any lab test that is abnormal or we need to change your treatment, we will call you to review the results.  Testing/Procedures: NOne  Follow-Up: At [facility], you and your health needs are our priority.  As part of our continuing mission to provide you with exceptional heart care, we have created designated Provider Care Teams.  These Care Teams include your primary Cardiologist (physician) and Advanced Practice Providers (  APPs -  Physician Assistants and Nurse Practitioners) who all work together to provide you with the care you need, when you need it.  Your next appointment:   1 month(s)  The format for your next appointment:   In Person  Provider:   Berniece Salines, DO  Other Instructions      Adopting a Healthy Lifestyle.  Know what a healthy weight is for you (roughly BMI <25) and aim to maintain this   Aim for 7+ servings of fruits and vegetables daily   65-80+ fluid ounces of water or unsweet tea for healthy kidneys   Limit to max 1 drink of alcohol per day; avoid smoking/tobacco   Limit animal fats in diet for cholesterol and heart health - choose grass fed whenever available   Avoid highly processed foods, and foods high in saturated/trans fats   Aim for low stress - take time to unwind and care for your mental health   Aim for 150 min of moderate intensity exercise weekly for heart health, and weights twice weekly for bone health   Aim for 7-9 hours of sleep daily   When it comes to diets, agreement about the perfect plan isnt easy to find, even among the experts. Experts at the [place] [place] developed an idea known as the Healthy Eating Plate. Just imagine a plate divided into logical, healthy portions.   The emphasis is on diet quality:   Load up on vegetables and fruits - one-half of your plate: Aim for color and variety, and remember that potatoes dont count.   Go for whole grains - one-quarter of your plate: Whole wheat, barley, wheat berries, quinoa, oats, brown rice, and foods made with them. If you want pasta, go with whole wheat pasta.   Protein power - one-quarter of your plate:  Fish, chicken, beans, and nuts are all healthy, versatile protein sources. Limit red meat.   The diet, however, does go beyond the plate, offering a few other suggestions.   Use healthy plant oils, such as olive, canola, soy, corn, sunflower and peanut. Check the labels, and avoid partially hydrogenated oil, which have unhealthy trans fats.   If youre thirsty, drink water. Coffee and tea are good in moderation, but skip sugary drinks and limit milk and dairy products to one or two daily servings.   The type of carbohydrate in the diet is more important than the amount. Some sources of carbohydrates, such as vegetables, fruits, whole grains, and beans-are healthier than others.   Finally, stay active  Signed, Berniece Salines, DO  11/22/2019 12:19 PM    [facility]

## 2019-12-04 ENCOUNTER — Ambulatory Visit: Payer: Medicare Other | Admitting: Cardiology

## 2019-12-05 ENCOUNTER — Telehealth: Payer: Self-pay | Admitting: Cardiology

## 2019-12-05 NOTE — Telephone Encounter (Signed)
New Message  Patient's daughter Helene Kelp is calling in to see if Dr. Harriet Masson still wants patient to do the myocardial perfusion since patient has started dialysis this week. Please give patient's daughter a call back to discuss.

## 2019-12-05 NOTE — Telephone Encounter (Signed)
Telephone call to patient daughter Anna Carr. . Informed her after speaking with Dr Harriet Masson she wants her to get the myocardial perfusion study done. Anna Carr verbalized understanding.

## 2019-12-06 ENCOUNTER — Encounter (INDEPENDENT_AMBULATORY_CARE_PROVIDER_SITE_OTHER): Payer: Medicare Other | Admitting: Ophthalmology

## 2019-12-06 DIAGNOSIS — N2581 Secondary hyperparathyroidism of renal origin: Secondary | ICD-10-CM | POA: Insufficient documentation

## 2019-12-06 DIAGNOSIS — Z111 Encounter for screening for respiratory tuberculosis: Secondary | ICD-10-CM | POA: Insufficient documentation

## 2019-12-06 DIAGNOSIS — D509 Iron deficiency anemia, unspecified: Secondary | ICD-10-CM

## 2019-12-06 DIAGNOSIS — L299 Pruritus, unspecified: Secondary | ICD-10-CM

## 2019-12-06 DIAGNOSIS — R52 Pain, unspecified: Secondary | ICD-10-CM | POA: Insufficient documentation

## 2019-12-06 HISTORY — DX: Iron deficiency anemia, unspecified: D50.9

## 2019-12-06 HISTORY — DX: Pain, unspecified: R52

## 2019-12-06 HISTORY — DX: Encounter for screening for respiratory tuberculosis: Z11.1

## 2019-12-06 HISTORY — DX: Pruritus, unspecified: L29.9

## 2019-12-06 HISTORY — DX: Secondary hyperparathyroidism of renal origin: N25.81

## 2019-12-07 DIAGNOSIS — D689 Coagulation defect, unspecified: Secondary | ICD-10-CM

## 2019-12-07 HISTORY — DX: Coagulation defect, unspecified: D68.9

## 2019-12-13 ENCOUNTER — Ambulatory Visit: Payer: Medicare Other | Admitting: Cardiology

## 2019-12-28 DIAGNOSIS — Z4802 Encounter for removal of sutures: Secondary | ICD-10-CM | POA: Insufficient documentation

## 2019-12-28 HISTORY — DX: Encounter for removal of sutures: Z48.02

## 2019-12-31 ENCOUNTER — Inpatient Hospital Stay (HOSPITAL_COMMUNITY)
Admission: AD | Admit: 2019-12-31 | Payer: Medicare Other | Source: Other Acute Inpatient Hospital | Admitting: Internal Medicine

## 2020-01-01 ENCOUNTER — Ambulatory Visit: Payer: Medicare Other | Admitting: Cardiology

## 2020-01-02 ENCOUNTER — Other Ambulatory Visit: Payer: Medicare Other

## 2020-01-02 DIAGNOSIS — Z992 Dependence on renal dialysis: Secondary | ICD-10-CM | POA: Insufficient documentation

## 2020-01-02 DIAGNOSIS — D649 Anemia, unspecified: Secondary | ICD-10-CM | POA: Diagnosis not present

## 2020-01-02 DIAGNOSIS — K922 Gastrointestinal hemorrhage, unspecified: Secondary | ICD-10-CM | POA: Insufficient documentation

## 2020-01-02 DIAGNOSIS — I10 Essential (primary) hypertension: Secondary | ICD-10-CM | POA: Diagnosis not present

## 2020-01-02 DIAGNOSIS — N186 End stage renal disease: Secondary | ICD-10-CM

## 2020-01-02 DIAGNOSIS — E871 Hypo-osmolality and hyponatremia: Secondary | ICD-10-CM

## 2020-01-02 HISTORY — DX: End stage renal disease: N18.6

## 2020-01-02 HISTORY — DX: Gastrointestinal hemorrhage, unspecified: K92.2

## 2020-01-03 ENCOUNTER — Ambulatory Visit: Payer: Medicare Other | Admitting: Cardiology

## 2020-01-03 ENCOUNTER — Telehealth: Payer: Self-pay | Admitting: *Deleted

## 2020-01-03 NOTE — Telephone Encounter (Signed)
Left message on voicemail in reference to upcoming appointment scheduled for 01/10/2020. Phone number given for a call back so details instructions can be given. Sharayah Renfrow, Ranae Palms No mychart  avaiable

## 2020-01-04 ENCOUNTER — Telehealth: Payer: Self-pay | Admitting: Cardiology

## 2020-01-04 ENCOUNTER — Ambulatory Visit: Payer: Medicare Other | Admitting: Cardiology

## 2020-01-04 NOTE — Telephone Encounter (Signed)
Patient's daughter states she is calling to make Dr. Harriet Masson aware that the patient is currently hospitalized with GI bleeding. Please return call to discuss.

## 2020-01-04 NOTE — Telephone Encounter (Signed)
I spoke with patient's daughter who reports patient is currently in [facility] with possible GI bleed.  I told her I would let Dr Harriet Masson know.

## 2020-01-05 ENCOUNTER — Ambulatory Visit: Payer: Medicare Other | Admitting: Cardiology

## 2020-01-08 DIAGNOSIS — K649 Unspecified hemorrhoids: Secondary | ICD-10-CM

## 2020-01-08 DIAGNOSIS — K5730 Diverticulosis of large intestine without perforation or abscess without bleeding: Secondary | ICD-10-CM | POA: Insufficient documentation

## 2020-01-08 DIAGNOSIS — Z860100 Personal history of colon polyps, unspecified: Secondary | ICD-10-CM

## 2020-01-08 HISTORY — DX: Personal history of colon polyps, unspecified: Z86.0100

## 2020-01-08 HISTORY — DX: Diverticulosis of large intestine without perforation or abscess without bleeding: K57.30

## 2020-01-08 HISTORY — DX: Other disorders of phosphorus metabolism: E83.39

## 2020-01-08 HISTORY — DX: Unspecified hemorrhoids: K64.9

## 2020-01-10 ENCOUNTER — Ambulatory Visit: Payer: Medicare Other | Admitting: Cardiology

## 2020-01-10 ENCOUNTER — Encounter: Payer: Self-pay | Admitting: Cardiology

## 2020-01-10 ENCOUNTER — Other Ambulatory Visit: Payer: Self-pay

## 2020-01-10 VITALS — BP 144/58 | HR 71 | Ht 62.0 in | Wt 151.0 lb

## 2020-01-10 DIAGNOSIS — E782 Mixed hyperlipidemia: Secondary | ICD-10-CM

## 2020-01-10 DIAGNOSIS — N186 End stage renal disease: Secondary | ICD-10-CM

## 2020-01-10 DIAGNOSIS — I5032 Chronic diastolic (congestive) heart failure: Secondary | ICD-10-CM | POA: Diagnosis not present

## 2020-01-10 DIAGNOSIS — I5030 Unspecified diastolic (congestive) heart failure: Secondary | ICD-10-CM | POA: Diagnosis not present

## 2020-01-10 NOTE — Patient Instructions (Signed)

## 2020-01-10 NOTE — Progress Notes (Signed)
Cardiology Office Note:    Date:  01/10/2020   ID:  Corky Downs, DOB 04-Jun-1941, MRN [id number]  PCP:  Raina Mina., MD  Cardiologist:  Berniece Salines, DO  Electrophysiologist:  None   Referring MD: Raina Mina., MD   Chief Complaint  Patient presents with  . Hospitalization Follow-up    History of Present Illness:    Anna Carr is a 79 y.o. female with a hx of chronic diastolic heart failure, hypertension, grade 2 diastolic dysfunction, end-stage renal disease recently started hemodialysis on Tuesday Thursdays and Saturday presents for follow-up visit.  The patient was recently hospitalized at [facility] for GI bleeding she was transferred to [facility] where she underwent an upper endoscopy with no identifiable source of bleeding.  She was placed on Protonix 40 mg daily.  Patient is here for follow-up visit she appears to be doing well from a cardiovascular standpoint.  Her shortness of breath has improved significantly since her start of hemodialysis.  No other complaints at this time.  Past Medical History:  Diagnosis Date  . Acute on chronic diastolic congestive heart failure ([place]) 03/11/2019  . Acute systolic heart failure ([place]) 03/12/2019  . Anemia   . Anemia, chronic disease 01/23/2017  . AV fistula ([place])   . Benign hypertension with CKD (chronic kidney disease) stage V ([place]) 09/15/2018  . CHF with left ventricular diastolic dysfunction, NYHA class 1 ([place]) 02/02/2017  . Chronic diastolic heart failure ([place]) 11/22/2019  . Chronic kidney disease, stage 5 ([place]) 07/18/2019  . Colon polyp   . COPD mixed type ([place])   . Depression   . Dyspnea on exertion   . High risk medication use 12/22/2015  . Hyperlipidemia   . Hypertension   . Hypervolemia 11/13/2019  . Hypokalemia 03/11/2019  . Hyponatremia 03/11/2019  . Hypoxia 03/11/2019  . Insomnia   . Insulin long-term use ([place]) 12/22/2015  . Kidney disease   . Major depressive disorder,  recurrent ([place]) 12/22/2015  . Malaise and fatigue 12/23/2015  . Osteoarthritis   . Osteopenia   . 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 SIADH (syndrome of inappropriate ADH production) ([place]) 03/29/2019  . Type 2 diabetes mellitus ([place])   . Type 2 diabetes mellitus, with long-term current use of insulin ([place]) 03/11/2019    Past Surgical History:  Procedure Laterality Date  . APPENDECTOMY    . AV FISTULA PLACEMENT Left   . HYSTEROSCOPY      Current Medications: Current Meds  Medication Sig  . acetaminophen (TYLENOL) 500 MG tablet Take by mouth.  Marland Kitchen amLODipine (NORVASC) 10 MG tablet TAKE 1 TABLET BY MOUTH  DAILY  . ascorbic acid (VITAMIN C) 500 MG tablet Take by mouth.  Marland Kitchen aspirin 81 MG EC tablet Take by mouth.  Marland Kitchen BACILLUS COAGULANS-INULIN PO Takes one capsule daily  . bisacodyl (DULCOLAX) 5 MG EC tablet Take by mouth.  . carvedilol (COREG) 6.25 MG tablet Take by mouth in the morning, at noon, and at bedtime.   . Cholecalciferol 1.25 MG (50000 UT) TABS Take by mouth.  . cyanocobalamin 50 MCG tablet Take by mouth.  . furosemide (LASIX) 80 MG tablet Take 80 mg by mouth twice daily or as directed..  . glimepiride (AMARYL) 4 MG tablet Take by mouth 2 (two) times daily as needed.   . hydrALAZINE (APRESOLINE) 100 MG tablet Take 100 mg by mouth 3 (three) times daily.   . Insulin Glargine-Lixisenatide (SOLIQUA) 100-33 UNT-MCG/ML SOPN Inject 17 units once a  day with additional as needed.  MDD: 50 units/day  . Lactobacillus (PROBIOTIC ACIDOPHILUS PO) Take by mouth.  . metolazone (ZAROXOLYN) 2.5 MG tablet Take 2.5 mg by mouth daily.  . Omega-3 Fatty Acids (FISH OIL) 1000 MG CAPS 2 times daily.  . pantoprazole (PROTONIX) 40 MG tablet Take 40 mg by mouth daily.  . polyethylene glycol (MIRALAX / GLYCOLAX) 17 g packet Take 17 g by mouth daily.  . potassium chloride SA (KLOR-CON) 20 MEQ tablet Take 40 mEq by mouth 2 (two) times daily.   . psyllium (REGULOID) 0.52 g capsule Take by mouth.  . Vitamin E 400  units TABS Take by mouth.     Allergies:   Patient has no known allergies.   Social History   Socioeconomic History  . Marital status: Widowed    Spouse name: Not on file  . Number of children: Not on file  . Years of education: Not on file  . Highest education level: Not on file  Occupational History  . Not on file  Tobacco Use  . Smoking status: Former Smoker    Types: Cigarettes  . Smokeless tobacco: Never Used  Substance and Sexual Activity  . Alcohol use: Not Currently  . Drug use: Never  . Sexual activity: Not on file  Other Topics Concern  . Not on file  Social History Narrative  . Not on file   Social Determinants of Health   Financial Resource Strain:   . Difficulty of Paying Living Expenses:   Food Insecurity:   . Worried About Charity fundraiser in the Last Year:   . Arboriculturist in the Last Year:   Transportation Needs:   . Film/video editor (Medical):   Marland Kitchen Lack of Transportation (Non-Medical):   Physical Activity:   . Days of Exercise per Week:   . Minutes of Exercise per Session:   Stress:   . Feeling of Stress :   Social Connections:   . Frequency of Communication with Friends and Family:   . Frequency of Social Gatherings with Friends and Family:   . Attends Religious Services:   . Active Member of Clubs or Organizations:   . Attends Archivist Meetings:   Marland Kitchen Marital Status:      Family History: The patient's family history includes Diabetes in her maternal grandfather and mother; Hypertension in her mother.  ROS:   Review of Systems  Constitution: Negative for decreased appetite, fever and weight gain.  HENT: Negative for congestion, ear discharge, hoarse voice and sore throat.   Eyes: Negative for discharge, redness, vision loss in right eye and visual halos.  Cardiovascular: Negative for chest pain, dyspnea on exertion, leg swelling, orthopnea and palpitations.  Respiratory: Negative for cough, hemoptysis, shortness of  breath and snoring.   Endocrine: Negative for heat intolerance and polyphagia.  Hematologic/Lymphatic: Negative for bleeding problem. Does not bruise/bleed easily.  Skin: Negative for flushing, nail changes, rash and suspicious lesions.  Musculoskeletal: Negative for arthritis, joint pain, muscle cramps, myalgias, neck pain and stiffness.  Gastrointestinal: Negative for abdominal pain, bowel incontinence, diarrhea and excessive appetite.  Genitourinary: Negative for decreased libido, genital sores and incomplete emptying.  Neurological: Negative for brief paralysis, focal weakness, headaches and loss of balance.  Psychiatric/Behavioral: Negative for altered mental status, depression and suicidal ideas.  Allergic/Immunologic: Negative for HIV exposure and persistent infections.    EKGs/Labs/Other Studies Reviewed:    The following studies were reviewed today:   EKG:  None  today  Recent Labs: No results found for requested labs within last 8760 hours.  Recent Lipid Panel No results found for: CHOL, TRIG, HDL, CHOLHDL, VLDL, LDLCALC, LDLDIRECT  Physical Exam:    VS:  BP (!) 144/58 (BP Location: Right Arm, Patient Position: Sitting, Cuff Size: Normal)   Pulse 71   Ht 5\' 2"  (1.575 m)   Wt 151 lb (68.5 kg)   SpO2 98%   BMI 27.62 kg/m     Wt Readings from Last 3 Encounters:  01/10/20 151 lb (68.5 kg)  11/22/19 152 lb 3.2 oz (69 kg)  11/13/19 165 lb (74.8 kg)     GEN: Well nourished, well developed in no acute distress HEENT: Normal NECK: No JVD; No carotid bruits LYMPHATICS: No lymphadenopathy CARDIAC: S1S2 noted,RRR, no murmurs, rubs, gallops RESPIRATORY:  Clear to auscultation without rales, wheezing or rhonchi  ABDOMEN: Soft, non-tender, non-distended, +bowel sounds, no guarding. EXTREMITIES: No edema, No cyanosis, no clubbing MUSCULOSKELETAL:  No deformity  SKIN: Warm and dry NEUROLOGIC:  Alert and oriented x 3, non-focal PSYCHIATRIC:  Normal affect, good  insight  ASSESSMENT:    1. CHF with left ventricular diastolic dysfunction, NYHA class 1 ([place])   2. Chronic diastolic heart failure ([place])   3. Mixed hyperlipidemia   4. ESRD (end stage renal disease) ([place])    PLAN:    She appears to be doing well from a cardiovascular standpoint.She started HD and her shortness of breath have tremendously improved.   Her blood pressure is acceptable in the office today - no changes will be made to her antihypertensive regimen.  She recently had episodes of GI bleeding and had endoscopy at [facility] with no evidence of active bleeding.   The patient is in agreement with the above plan. The patient left the office in stable condition.  The patient will follow up in 6 months follow up.   Medication Adjustments/Labs and Tests Ordered: Current medicines are reviewed at length with the patient today.  Concerns regarding medicines are outlined above.  No orders of the defined types were placed in this encounter.  No orders of the defined types were placed in this encounter.   Patient Instructions  Medication Instructions:  Your physician recommends that you continue on your current medications as directed. Please refer to the Current Medication list given to you today.  *If you need a refill on your cardiac medications before your next appointment, please call your pharmacy*   Lab Work: NONE If you have labs (blood work) drawn today and your tests are completely normal, you will receive your results only by: Marland Kitchen MyChart Message (if you have MyChart) OR . A paper copy in the mail If you have any lab test that is abnormal or we need to change your treatment, we will call you to review the results.   Testing/Procedures: NONE   Follow-Up: At [facility], you and your health needs are our priority.  As part of our continuing mission to provide you with exceptional heart care, we have created designated Provider Care Teams.  These Care Teams include  your primary Cardiologist (physician) and Advanced Practice Providers (APPs -  Physician Assistants and Nurse Practitioners) who all work together to provide you with the care you need, when you need it.  We recommend signing up for the patient portal called "MyChart".  Sign up information is provided on this After Visit Summary.  MyChart is used to connect with patients for Virtual Visits (Telemedicine).  Patients are able  to view lab/test results, encounter notes, upcoming appointments, etc.  Non-urgent messages can be sent to your provider as well.   To learn more about what you can do with MyChart, go to [email or web address].    Your next appointment:   6 month(s)  The format for your next appointment:   In Person  Provider:   Berniece Salines, DO   Other Instructions      Adopting a Healthy Lifestyle.  Know what a healthy weight is for you (roughly BMI <25) and aim to maintain this   Aim for 7+ servings of fruits and vegetables daily   65-80+ fluid ounces of water or unsweet tea for healthy kidneys   Limit to max 1 drink of alcohol per day; avoid smoking/tobacco   Limit animal fats in diet for cholesterol and heart health - choose grass fed whenever available   Avoid highly processed foods, and foods high in saturated/trans fats   Aim for low stress - take time to unwind and care for your mental health   Aim for 150 min of moderate intensity exercise weekly for heart health, and weights twice weekly for bone health   Aim for 7-9 hours of sleep daily   When it comes to diets, agreement about the perfect plan isnt easy to find, even among the experts. Experts at the [employer] developed an idea known as the Healthy Eating Plate. Just imagine a plate divided into logical, healthy portions.   The emphasis is on diet quality:   Load up on vegetables and fruits - one-half of your plate: Aim for color and variety, and remember that potatoes dont  count.   Go for whole grains - one-quarter of your plate: Whole wheat, barley, wheat berries, quinoa, oats, brown rice, and foods made with them. If you want pasta, go with whole wheat pasta.   Protein power - one-quarter of your plate: Fish, chicken, beans, and nuts are all healthy, versatile protein sources. Limit red meat.   The diet, however, does go beyond the plate, offering a few other suggestions.   Use healthy plant oils, such as olive, canola, soy, corn, sunflower and peanut. Check the labels, and avoid partially hydrogenated oil, which have unhealthy trans fats.   If youre thirsty, drink water. Coffee and tea are good in moderation, but skip sugary drinks and limit milk and dairy products to one or two daily servings.   The type of carbohydrate in the diet is more important than the amount. Some sources of carbohydrates, such as vegetables, fruits, whole grains, and beans-are healthier than others.   Finally, stay active  Signed, Berniece Salines, DO  01/10/2020 6:15 PM    [facility]

## 2020-01-23 DIAGNOSIS — R509 Fever, unspecified: Secondary | ICD-10-CM

## 2020-01-23 HISTORY — DX: Fever, unspecified: R50.9

## 2020-01-25 ENCOUNTER — Telehealth: Payer: Self-pay | Admitting: *Deleted

## 2020-01-25 NOTE — Telephone Encounter (Signed)
Left message on voicemail in reference to upcoming appointment scheduled for 01/31/2020. Phone number given for a call back so details instructions can be given. Theophil Thivierge, [place] No mychart available

## 2020-01-29 ENCOUNTER — Telehealth (HOSPITAL_COMMUNITY): Payer: Self-pay

## 2020-01-29 NOTE — Telephone Encounter (Signed)
Patient given detailed instructions per Myocardial Perfusion Study Information Sheet for the test on 01/31/2020 at 8:15. Patient notified to arrive 15 minutes early and that it is imperative to arrive on time for appointment to keep from having the test rescheduled.  If you need to cancel or reschedule your appointment, please call the office within 24 hours of your appointment. . Patient verbalized understanding.[email or web address]

## 2020-01-31 ENCOUNTER — Ambulatory Visit (INDEPENDENT_AMBULATORY_CARE_PROVIDER_SITE_OTHER): Payer: Medicare Other

## 2020-01-31 ENCOUNTER — Other Ambulatory Visit: Payer: Self-pay

## 2020-01-31 VITALS — Ht 62.0 in | Wt 151.0 lb

## 2020-01-31 DIAGNOSIS — I5033 Acute on chronic diastolic (congestive) heart failure: Secondary | ICD-10-CM

## 2020-01-31 DIAGNOSIS — R0600 Dyspnea, unspecified: Secondary | ICD-10-CM

## 2020-01-31 DIAGNOSIS — R0609 Other forms of dyspnea: Secondary | ICD-10-CM

## 2020-01-31 LAB — MYOCARDIAL PERFUSION IMAGING
LV dias vol: 114 mL (ref 46–106)
LV sys vol: 50 mL
Peak HR: 81 {beats}/min
Rest HR: 66 {beats}/min
SDS: 0
SRS: 1
SSS: 1
TID: 1.03

## 2020-01-31 MED ORDER — REGADENOSON 0.4 MG/5ML IV SOLN
0.4000 mg | Freq: Once | INTRAVENOUS | Status: AC
  Administered 2020-01-31: 0.4 mg via INTRAVENOUS

## 2020-01-31 MED ORDER — TECHNETIUM TC 99M TETROFOSMIN IV KIT
10.5000 | PACK | Freq: Once | INTRAVENOUS | Status: AC | PRN
  Administered 2020-01-31: 10.5 via INTRAVENOUS

## 2020-01-31 MED ORDER — TECHNETIUM TC 99M TETROFOSMIN IV KIT
32.3000 | PACK | Freq: Once | INTRAVENOUS | Status: AC | PRN
  Administered 2020-01-31: 32.3 via INTRAVENOUS

## 2020-02-06 ENCOUNTER — Encounter: Payer: Self-pay | Admitting: Emergency Medicine

## 2020-02-09 ENCOUNTER — Telehealth: Payer: Self-pay | Admitting: Cardiology

## 2020-02-09 NOTE — Telephone Encounter (Signed)
Patient states she is requesting results from myocardial perfusion completed on 01/31/20. Please return call to discuss.

## 2020-02-09 NOTE — Telephone Encounter (Signed)
The patient has been notified of the stress test result and verbalized understanding.  All questions (if any) were answered. Frederik Schmidt, RN 02/09/2020 1:24 PM

## 2020-02-21 ENCOUNTER — Other Ambulatory Visit: Payer: Self-pay

## 2020-02-21 ENCOUNTER — Encounter (INDEPENDENT_AMBULATORY_CARE_PROVIDER_SITE_OTHER): Payer: Medicare Other | Admitting: Ophthalmology

## 2020-02-21 DIAGNOSIS — I10 Essential (primary) hypertension: Secondary | ICD-10-CM | POA: Diagnosis not present

## 2020-02-21 DIAGNOSIS — H35372 Puckering of macula, left eye: Secondary | ICD-10-CM

## 2020-02-21 DIAGNOSIS — H43813 Vitreous degeneration, bilateral: Secondary | ICD-10-CM

## 2020-02-21 DIAGNOSIS — E11311 Type 2 diabetes mellitus with unspecified diabetic retinopathy with macular edema: Secondary | ICD-10-CM | POA: Diagnosis not present

## 2020-02-21 DIAGNOSIS — H35033 Hypertensive retinopathy, bilateral: Secondary | ICD-10-CM | POA: Diagnosis not present

## 2020-03-05 DIAGNOSIS — T829XXA Unspecified complication of cardiac and vascular prosthetic device, implant and graft, initial encounter: Secondary | ICD-10-CM | POA: Insufficient documentation

## 2020-03-05 HISTORY — DX: Unspecified complication of cardiac and vascular prosthetic device, implant and graft, initial encounter: T82.9XXA

## 2020-03-26 DIAGNOSIS — E440 Moderate protein-calorie malnutrition: Secondary | ICD-10-CM | POA: Insufficient documentation

## 2020-03-26 DIAGNOSIS — E441 Mild protein-calorie malnutrition: Secondary | ICD-10-CM

## 2020-03-26 HISTORY — DX: Mild protein-calorie malnutrition: E44.1

## 2020-03-27 ENCOUNTER — Encounter (INDEPENDENT_AMBULATORY_CARE_PROVIDER_SITE_OTHER): Payer: Medicare Other | Admitting: Ophthalmology

## 2020-03-27 ENCOUNTER — Other Ambulatory Visit: Payer: Self-pay

## 2020-03-27 DIAGNOSIS — E11311 Type 2 diabetes mellitus with unspecified diabetic retinopathy with macular edema: Secondary | ICD-10-CM

## 2020-03-27 DIAGNOSIS — E113513 Type 2 diabetes mellitus with proliferative diabetic retinopathy with macular edema, bilateral: Secondary | ICD-10-CM | POA: Diagnosis not present

## 2020-03-27 DIAGNOSIS — I10 Essential (primary) hypertension: Secondary | ICD-10-CM

## 2020-03-27 DIAGNOSIS — H43813 Vitreous degeneration, bilateral: Secondary | ICD-10-CM

## 2020-03-27 DIAGNOSIS — H35033 Hypertensive retinopathy, bilateral: Secondary | ICD-10-CM

## 2020-05-01 ENCOUNTER — Other Ambulatory Visit: Payer: Self-pay

## 2020-05-01 ENCOUNTER — Encounter (INDEPENDENT_AMBULATORY_CARE_PROVIDER_SITE_OTHER): Payer: Medicare Other | Admitting: Ophthalmology

## 2020-05-01 DIAGNOSIS — H43813 Vitreous degeneration, bilateral: Secondary | ICD-10-CM

## 2020-05-01 DIAGNOSIS — I10 Essential (primary) hypertension: Secondary | ICD-10-CM

## 2020-05-01 DIAGNOSIS — E113513 Type 2 diabetes mellitus with proliferative diabetic retinopathy with macular edema, bilateral: Secondary | ICD-10-CM

## 2020-05-01 DIAGNOSIS — H35372 Puckering of macula, left eye: Secondary | ICD-10-CM

## 2020-05-01 DIAGNOSIS — E11311 Type 2 diabetes mellitus with unspecified diabetic retinopathy with macular edema: Secondary | ICD-10-CM | POA: Diagnosis not present

## 2020-05-01 DIAGNOSIS — H35033 Hypertensive retinopathy, bilateral: Secondary | ICD-10-CM

## 2020-05-01 DIAGNOSIS — H2513 Age-related nuclear cataract, bilateral: Secondary | ICD-10-CM

## 2020-06-05 ENCOUNTER — Encounter (INDEPENDENT_AMBULATORY_CARE_PROVIDER_SITE_OTHER): Payer: Medicare Other | Admitting: Ophthalmology

## 2020-06-19 ENCOUNTER — Encounter (INDEPENDENT_AMBULATORY_CARE_PROVIDER_SITE_OTHER): Payer: Medicare Other | Admitting: Ophthalmology

## 2020-06-19 ENCOUNTER — Other Ambulatory Visit: Payer: Self-pay

## 2020-06-19 DIAGNOSIS — E11311 Type 2 diabetes mellitus with unspecified diabetic retinopathy with macular edema: Secondary | ICD-10-CM | POA: Diagnosis not present

## 2020-06-19 DIAGNOSIS — E113513 Type 2 diabetes mellitus with proliferative diabetic retinopathy with macular edema, bilateral: Secondary | ICD-10-CM

## 2020-06-19 DIAGNOSIS — H35033 Hypertensive retinopathy, bilateral: Secondary | ICD-10-CM | POA: Diagnosis not present

## 2020-06-19 DIAGNOSIS — I10 Essential (primary) hypertension: Secondary | ICD-10-CM

## 2020-06-19 DIAGNOSIS — H43813 Vitreous degeneration, bilateral: Secondary | ICD-10-CM

## 2020-07-17 ENCOUNTER — Other Ambulatory Visit: Payer: Self-pay

## 2020-07-17 ENCOUNTER — Encounter (INDEPENDENT_AMBULATORY_CARE_PROVIDER_SITE_OTHER): Payer: Medicare Other | Admitting: Ophthalmology

## 2020-07-17 DIAGNOSIS — E11311 Type 2 diabetes mellitus with unspecified diabetic retinopathy with macular edema: Secondary | ICD-10-CM | POA: Diagnosis not present

## 2020-07-17 DIAGNOSIS — H43813 Vitreous degeneration, bilateral: Secondary | ICD-10-CM

## 2020-07-17 DIAGNOSIS — E113513 Type 2 diabetes mellitus with proliferative diabetic retinopathy with macular edema, bilateral: Secondary | ICD-10-CM

## 2020-07-17 DIAGNOSIS — I10 Essential (primary) hypertension: Secondary | ICD-10-CM | POA: Diagnosis not present

## 2020-07-17 DIAGNOSIS — H35372 Puckering of macula, left eye: Secondary | ICD-10-CM

## 2020-07-17 DIAGNOSIS — H35033 Hypertensive retinopathy, bilateral: Secondary | ICD-10-CM | POA: Diagnosis not present

## 2020-07-22 ENCOUNTER — Ambulatory Visit: Payer: Medicare Other | Admitting: Cardiology

## 2020-07-22 ENCOUNTER — Encounter: Payer: Self-pay | Admitting: Cardiology

## 2020-07-22 ENCOUNTER — Ambulatory Visit (INDEPENDENT_AMBULATORY_CARE_PROVIDER_SITE_OTHER): Payer: Medicare Other

## 2020-07-22 ENCOUNTER — Other Ambulatory Visit: Payer: Self-pay

## 2020-07-22 VITALS — BP 160/72 | HR 70 | Ht 62.0 in | Wt 162.6 lb

## 2020-07-22 DIAGNOSIS — E1122 Type 2 diabetes mellitus with diabetic chronic kidney disease: Secondary | ICD-10-CM | POA: Diagnosis not present

## 2020-07-22 DIAGNOSIS — I5030 Unspecified diastolic (congestive) heart failure: Secondary | ICD-10-CM | POA: Diagnosis not present

## 2020-07-22 DIAGNOSIS — R002 Palpitations: Secondary | ICD-10-CM | POA: Diagnosis not present

## 2020-07-22 DIAGNOSIS — N185 Chronic kidney disease, stage 5: Secondary | ICD-10-CM

## 2020-07-22 DIAGNOSIS — N186 End stage renal disease: Secondary | ICD-10-CM

## 2020-07-22 DIAGNOSIS — I120 Hypertensive chronic kidney disease with stage 5 chronic kidney disease or end stage renal disease: Secondary | ICD-10-CM

## 2020-07-22 DIAGNOSIS — R42 Dizziness and giddiness: Secondary | ICD-10-CM | POA: Diagnosis not present

## 2020-07-22 DIAGNOSIS — Z992 Dependence on renal dialysis: Secondary | ICD-10-CM

## 2020-07-22 DIAGNOSIS — Z794 Long term (current) use of insulin: Secondary | ICD-10-CM

## 2020-07-22 NOTE — Progress Notes (Signed)
Cardiology Office Note:    Date:  07/22/2020   ID:  Anna, Carr [birth year], MRN [id number]  PCP:  Raina Mina., MD  Cardiologist:  Berniece Salines, DO  Electrophysiologist:  None   Referring MD: Raina Mina., MD   " I have been lightheaded at times"  History of Present Illness:    Anna Carr a 79 y.o.femalewith a hx of chronic diastolic heart failure, hypertension, grade 2 diastolic dysfunction, end-stage renal disease recently started hemodialysis on Tuesday Thursdays and Saturday presents for follow-up visit.  The patient is here today in the office with her daughter.  She tells me she has been experiencing intermittent lightheadedness.  She also notes that there is some shortness of breath but could not really determine if this is worse on nondialysis days.  The lightheadedness she reports sometimes it does not matter what she is doing she feels lightheaded with some dizziness.  Past Medical History:  Diagnosis Date   Acute on chronic diastolic congestive heart failure ([place]) 05/01/2375   Acute systolic heart failure ([place]) 03/12/2019   Anemia    Anemia, chronic disease 01/23/2017   AV fistula ([name])    Benign hypertension with CKD (chronic kidney disease) stage V ([place]) 09/15/2018   CHF with left ventricular diastolic dysfunction, NYHA class 1 ([name]) 02/02/2017   Chronic diastolic heart failure ([place]) 11/22/2019   Chronic kidney disease, stage 5 ([place]) 07/18/2019   Colon polyp    COPD mixed type ([name])    Depression    Dyspnea on exertion    High risk medication use 12/22/2015   Hyperlipidemia    Hypertension    Hypervolemia 11/13/2019   Hypokalemia 03/11/2019   Hyponatremia 03/11/2019   Hypoxia 03/11/2019   Insomnia    Insulin long-term use ([place]) 12/22/2015   Kidney disease    Major depressive disorder, recurrent ([place]) 12/22/2015   Malaise and fatigue 12/23/2015   Osteoarthritis    Osteopenia    Primary osteoarthritis  12/22/2015   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    amLODipine (NORVASC) 10 MG tablet TAKE 1 TABLET BY MOUTH  DAILY   ascorbic acid (VITAMIN C) 1000 MG tablet Take 1,000 mg by mouth daily.   aspirin 81 MG EC tablet Take 81 mg by mouth daily.    BACILLUS COAGULANS-INULIN PO Takes one capsule daily   Biotin 5 MG TBDP Take 5,000 mcg by mouth every other day.   bisacodyl (DULCOLAX) 5 MG EC tablet Take 5 mg by mouth daily as needed.    Calcium Polycarbophil (FIBER) 625 MG TABS Take 625 mg by mouth daily.   carvedilol (COREG) 6.25 MG tablet Take 6.25 mg by mouth 2 (two) times daily.   Cholecalciferol 1.25 MG (50000 UT) TABS Take by mouth.   cyanocobalamin 50 MCG tablet Take 50 mcg by mouth daily.    Docusate Sodium 100 MG capsule Take 100 mg by mouth as needed.   furosemide (LASIX) 80 MG tablet Take 80 mg by mouth daily.   glimepiride (AMARYL) 4 MG tablet Take 4 mg  by mouth daily.   Insulin Glargine-Lixisenatide (SOLIQUA) 100-33 UNT-MCG/ML SOPN Inject 12 Units into the skin in the morning.   Lactobacillus (PROBIOTIC ACIDOPHILUS PO) Take by mouth.   lidocaine-prilocaine (EMLA) cream Apply 1 application topically as needed.   Omega-3 Fatty Acids (FISH OIL) 1000 MG CAPS 2 times daily.   polyethylene glycol (MIRALAX / GLYCOLAX) 17 g packet Take 17 g by mouth daily.   vitamin E 1000 UNIT capsule Take 1,000 Units by mouth daily.     Allergies:   Patient has no known allergies.   Social History   Socioeconomic History   Marital status: Widowed    Spouse name: Not on file   Number  of children: Not on file   Years of education: Not on file   Highest education level: Not on file  Occupational History   Not on file  Tobacco Use   Smoking status: Former Smoker    Types: Cigarettes   Smokeless tobacco: Never Used  Scientific laboratory technician Use: Never used  Substance and Sexual Activity   Alcohol use: Not Currently   Drug use: Never   Sexual activity: Not on file  Other Topics Concern   Not on file  Social History Narrative   Not on file   Social Determinants of Health   Financial Resource Strain:    Difficulty of Paying Living Expenses: Not on file  Food Insecurity:    Worried About [place] in the Last Year: Not on file   Ran Out of Food in the Last Year: Not on file  Transportation Needs:    Lack of Transportation (Medical): Not on file   Lack of Transportation (Non-Medical): Not on file  Physical Activity:    Days of Exercise per Week: Not on file   Minutes of Exercise per Session: Not on file  Stress:    Feeling of Stress : Not on file  Social Connections:    Frequency of Communication with Friends and Family: Not on file   Frequency of Social Gatherings with Friends and Family: Not on file   Attends Religious Services: Not on file   Active Member of Clubs or Organizations: Not on file   Attends Archivist Meetings: Not on file   Marital Status: Not on file     Family History: The patient's family history includes Diabetes in her maternal grandfather and mother; Hypertension in her mother.  ROS:   Review of Systems  Constitution: Negative for decreased appetite, fever and weight gain.  HENT: Negative for congestion, ear discharge, hoarse voice and sore throat.   Eyes: Negative for discharge, redness, vision loss in right eye and visual halos.  Cardiovascular: Negative for chest pain, dyspnea on exertion, leg swelling, orthopnea and palpitations.  Respiratory: Negative for cough, hemoptysis, shortness  of breath and snoring.   Endocrine: Negative for heat intolerance and polyphagia.  Hematologic/Lymphatic: Negative for bleeding problem. Does not bruise/bleed easily.  Skin: Negative for flushing, nail changes, rash and suspicious lesions.  Musculoskeletal: Negative for arthritis, joint pain, muscle cramps, myalgias, neck pain and stiffness.  Gastrointestinal: Negative for abdominal pain, bowel incontinence, diarrhea and excessive appetite.  Genitourinary: Negative for decreased libido, genital sores and incomplete emptying.  Neurological: Negative for brief paralysis, focal weakness, headaches and loss of balance.  Psychiatric/Behavioral: Negative for altered mental status, depression and suicidal ideas.  Allergic/Immunologic: Negative for HIV exposure and persistent infections.    EKGs/Labs/Other Studies Reviewed:    The following studies were  reviewed today:   EKG:  The ekg ordered today demonstrates accelerated junctional rhythm, heart rate 70 bpm compared to prior EKG at which time patient was sinus rhythm.  Recent Labs: No results found for requested labs within last 8760 hours.  Recent Lipid Panel No results found for: CHOL, TRIG, HDL, CHOLHDL, VLDL, LDLCALC, LDLDIRECT  Physical Exam:    VS:  BP (!) 160/72    Pulse 70    Ht 5\' 2"  (1.575 m)    Wt 162 lb 9.6 oz (73.8 kg)    SpO2 99%    BMI 29.74 kg/m     Wt Readings from Last 3 Encounters:  07/22/20 162 lb 9.6 oz (73.8 kg)  01/31/20 151 lb (68.5 kg)  01/10/20 151 lb (68.5 kg)     GEN: Well nourished, well developed in no acute distress HEENT: Normal NECK: No JVD; No carotid bruits LYMPHATICS: No lymphadenopathy CARDIAC: S1S2 noted,RRR, no murmurs, rubs, gallops RESPIRATORY:  Clear to auscultation without rales, wheezing or rhonchi  ABDOMEN: Soft, non-tender, non-distended, +bowel sounds, no guarding. EXTREMITIES: No edema, No cyanosis, no clubbing MUSCULOSKELETAL:  No deformity  SKIN: Warm and dry NEUROLOGIC:  Alert  and oriented x 3, non-focal PSYCHIATRIC:  Normal affect, good insight  ASSESSMENT:    1. Dizziness   2. Benign hypertension with CKD (chronic kidney disease) stage V ([place])   3. CHF with left ventricular diastolic dysfunction, NYHA class 1 ([place])   4. Type 2 diabetes mellitus with chronic kidney disease on chronic dialysis, with long-term current use of insulin ([name])    PLAN:      I like to place a monitor the patient to understand if any arrhythmia or worsening bradycardia is contributing to her symptoms.  She will wear ZIO monitor for 7 days.  In terms of her shortness of breath this is not clear if this is worsening on her nondialysis days.  I have asked the patient to observe and see if she feels better on dialysis days compared to nondialysis days.  She has had a recent stress test which was done April 2021 which was normal.  We reviewed her echocardiogram as well which show evidence of normal EF and diastolic dysfunction.  Now keep her on her current medication regimen.  She is hypertensive slightly but due to her dizziness I am apprehensive to add additional medication as she tells me on nondialysis days her blood pressure is way lower.  The patient is in agreement with the above plan. The patient left the office in stable condition.  The patient will follow up in 8 weeks or sooner if needed.   Medication Adjustments/Labs and Tests Ordered: Current medicines are reviewed at length with the patient today.  Concerns regarding medicines are outlined above.  Orders Placed This Encounter  Procedures   LONG TERM MONITOR (3-14 DAYS)   EKG 12-Lead   No orders of the defined types were placed in this encounter.   Patient Instructions  Medication Instructions:  No medication changes. *If you need a refill on your cardiac medications before your next appointment, please call your pharmacy*   Lab Work: None ordered If you have labs (blood work) drawn today and your tests are  completely normal, you will receive your results only by:  [place] (if you have MyChart) OR  A paper copy in the mail If you have any lab test that is abnormal or we need to change your treatment, we will call you to review the results.  Testing/Procedures:  WHY IS MY DOCTOR PRESCRIBING ZIO? The Zio system is proven and trusted by physicians to detect and diagnose irregular heart rhythms -- and has been prescribed to hundreds of thousands of patients.  The FDA has cleared the Zio system to monitor for many different kinds of irregular heart rhythms. In a study, physicians were able to reach a diagnosis 90% of the time with the Zio system1.  You can wear the Zio monitor -- a small, discreet, comfortable patch -- during your normal day-to-day activity, including while you sleep, shower, and exercise, while it records every single heartbeat for analysis.  1Barrett, P., et al. Comparison of 24 Hour Holter Monitoring Versus 14 Day Novel Adhesive Patch Electrocardiographic Monitoring. [place], 2014.  ZIO VS. HOLTER MONITORING The Zio monitor can be comfortably worn for up to 14 days. Holter monitors can be worn for 24 to 48 hours, limiting the time to record any irregular heart rhythms you may have. Zio is able to capture data for the 51% of patients who have their first symptom-triggered arrhythmia after 48 hours.1  LIVE WITHOUT RESTRICTIONS The Zio ambulatory cardiac monitor is a small, unobtrusive, and water-resistant patch--you might even forget youre wearing it. The Zio monitor records and stores every beat of your heart, whether you're sleeping, working out, or showering. Wear the monitor for 7 days. Remove on 07/29/2020.   Follow-Up: At [facility], you and your health needs are our priority.  As part of our continuing mission to provide you with exceptional heart care, we have created designated Provider Care Teams.  These Care Teams include your primary  Cardiologist (physician) and Advanced Practice Providers (APPs -  Physician Assistants and Nurse Practitioners) who all work together to provide you with the care you need, when you need it.  We recommend signing up for the patient portal called "MyChart".  Sign up information is provided on this After Visit Summary.  MyChart is used to connect with patients for Virtual Visits (Telemedicine).  Patients are able to view lab/test results, encounter notes, upcoming appointments, etc.  Non-urgent messages can be sent to your provider as well.   To learn more about what you can do with MyChart, go to [email or web address].    Your next appointment:   8 week(s)  The format for your next appointment:   In Person  Provider:   Jyl Heinz, MD   Other Instructions NA     Adopting a Healthy Lifestyle.  Know what a healthy weight is for you (roughly BMI <25) and aim to maintain this   Aim for 7+ servings of fruits and vegetables daily   65-80+ fluid ounces of water or unsweet tea for healthy kidneys   Limit to max 1 drink of alcohol per day; avoid smoking/tobacco   Limit animal fats in diet for cholesterol and heart health - choose grass fed whenever available   Avoid highly processed foods, and foods high in saturated/trans fats   Aim for low stress - take time to unwind and care for your mental health   Aim for 150 min of moderate intensity exercise weekly for heart health, and weights twice weekly for bone health   Aim for 7-9 hours of sleep daily   When it comes to diets, agreement about the perfect plan isnt easy to find, even among the experts. Experts at the [employer] developed an idea known as the Healthy Eating Plate. Just imagine a plate divided into logical, healthy  portions.   The emphasis is on diet quality:   Load up on vegetables and fruits - one-half of your plate: Aim for color and variety, and remember that potatoes dont count.   Go  for whole grains - one-quarter of your plate: Whole wheat, barley, wheat berries, quinoa, oats, brown rice, and foods made with them. If you want pasta, go with whole wheat pasta.   Protein power - one-quarter of your plate: Fish, chicken, beans, and nuts are all healthy, versatile protein sources. Limit red meat.   The diet, however, does go beyond the plate, offering a few other suggestions.   Use healthy plant oils, such as olive, canola, soy, corn, sunflower and peanut. Check the labels, and avoid partially hydrogenated oil, which have unhealthy trans fats.   If youre thirsty, drink water. Coffee and tea are good in moderation, but skip sugary drinks and limit milk and dairy products to one or two daily servings.   The type of carbohydrate in the diet is more important than the amount. Some sources of carbohydrates, such as vegetables, fruits, whole grains, and beans-are healthier than others.   Finally, stay active  Signed, Berniece Salines, DO  07/22/2020 12:23 PM    [facility]

## 2020-07-22 NOTE — Patient Instructions (Signed)
Medication Instructions:  No medication changes. *If you need a refill on your cardiac medications before your next appointment, please call your pharmacy*   Lab Work: None ordered If you have labs (blood work) drawn today and your tests are completely normal, you will receive your results only by: [place] [facility] MyChart Message (if you have MyChart) OR . A paper copy in the mail If you have any lab test that is abnormal or we need to change your treatment, we will call you to review the results.   Testing/Procedures:  WHY IS MY DOCTOR PRESCRIBING ZIO? The Zio system is proven and trusted by physicians to detect and diagnose irregular heart rhythms -- and has been prescribed to hundreds of thousands of patients.  The FDA has cleared the Zio system to monitor for many different kinds of irregular heart rhythms. In a study, physicians were able to reach a diagnosis 90% of the time with the Zio system1.  You can wear the Zio monitor -- a small, discreet, comfortable patch -- during your normal day-to-day activity, including while you sleep, shower, and exercise, while it records every single heartbeat for analysis.  1Barrett, P., et al. Comparison of 24 Hour Holter Monitoring Versus 14 Day Novel Adhesive Patch Electrocardiographic Monitoring. [place], 2014.  ZIO VS. HOLTER MONITORING The Zio monitor can be comfortably worn for up to 14 days. Holter monitors can be worn for 24 to 48 hours, limiting the time to record any irregular heart rhythms you may have. Zio is able to capture data for the 51% of patients who have their first symptom-triggered arrhythmia after 48 hours.1  LIVE WITHOUT RESTRICTIONS The Zio ambulatory cardiac monitor is a small, unobtrusive, and water-resistant patch--you might even forget you're wearing it. The Zio monitor records and stores every beat of your heart, whether you're sleeping, working out, or showering. Wear the monitor for 7 days. Remove on  07/29/2020.   Follow-Up: At [facility], you and your health needs are our priority.  As part of our continuing mission to provide you with exceptional heart care, we have created designated Provider Care Teams.  These Care Teams include your primary Cardiologist (physician) and Advanced Practice Providers (APPs -  Physician Assistants and Nurse Practitioners) who all work together to provide you with the care you need, when you need it.  We recommend signing up for the patient portal called "MyChart".  Sign up information is provided on this After Visit Summary.  MyChart is used to connect with patients for Virtual Visits (Telemedicine).  Patients are able to view lab/test results, encounter notes, upcoming appointments, etc.  Non-urgent messages can be sent to your provider as well.   To learn more about what you can do with MyChart, go to [email or web address].    Your next appointment:   8 week(s)  The format for your next appointment:   In Person  Provider:   Jyl Heinz, MD   Other Instructions NA

## 2020-08-08 ENCOUNTER — Telehealth: Payer: Self-pay

## 2020-08-08 NOTE — Telephone Encounter (Signed)
-----   Message from Berniece Salines, DO sent at 08/07/2020  7:07 PM EDT ----- I like to see the patient next week to discuss her monitor results.

## 2020-08-08 NOTE — Telephone Encounter (Signed)
Left message on patients voicemail to please return our call.   

## 2020-08-09 ENCOUNTER — Telehealth: Payer: Self-pay

## 2020-08-09 NOTE — Telephone Encounter (Signed)
Left message on patients voicemail to please return our call.   

## 2020-08-09 NOTE — Telephone Encounter (Signed)
-----   Message from Berniece Salines, DO sent at 08/07/2020  7:07 PM EDT ----- I like to see the patient next week to discuss her monitor results.

## 2020-08-12 ENCOUNTER — Telehealth: Payer: Self-pay

## 2020-08-12 NOTE — Telephone Encounter (Signed)
Left message on patients voicemail to please return our call.   I will send the patient a letter at this time after trying to reach her x3 with no success.

## 2020-08-12 NOTE — Telephone Encounter (Signed)
-----   Message from Berniece Salines, DO sent at 08/07/2020  7:07 PM EDT ----- I like to see the patient next week to discuss her monitor results.

## 2020-08-14 ENCOUNTER — Encounter (INDEPENDENT_AMBULATORY_CARE_PROVIDER_SITE_OTHER): Payer: Medicare Other | Admitting: Ophthalmology

## 2020-08-14 ENCOUNTER — Other Ambulatory Visit: Payer: Self-pay

## 2020-08-14 DIAGNOSIS — E113513 Type 2 diabetes mellitus with proliferative diabetic retinopathy with macular edema, bilateral: Secondary | ICD-10-CM | POA: Diagnosis not present

## 2020-08-14 DIAGNOSIS — H43813 Vitreous degeneration, bilateral: Secondary | ICD-10-CM

## 2020-08-14 DIAGNOSIS — H35033 Hypertensive retinopathy, bilateral: Secondary | ICD-10-CM

## 2020-08-14 DIAGNOSIS — E11311 Type 2 diabetes mellitus with unspecified diabetic retinopathy with macular edema: Secondary | ICD-10-CM

## 2020-08-14 DIAGNOSIS — I10 Essential (primary) hypertension: Secondary | ICD-10-CM | POA: Diagnosis not present

## 2020-08-14 DIAGNOSIS — H35372 Puckering of macula, left eye: Secondary | ICD-10-CM

## 2020-09-11 ENCOUNTER — Encounter (INDEPENDENT_AMBULATORY_CARE_PROVIDER_SITE_OTHER): Payer: Medicare Other | Admitting: Ophthalmology

## 2020-09-16 ENCOUNTER — Ambulatory Visit: Payer: Medicare Other | Admitting: Cardiology

## 2020-09-17 ENCOUNTER — Encounter (INDEPENDENT_AMBULATORY_CARE_PROVIDER_SITE_OTHER): Payer: Medicare Other | Admitting: Ophthalmology

## 2020-09-17 ENCOUNTER — Other Ambulatory Visit: Payer: Self-pay

## 2020-09-17 DIAGNOSIS — H43813 Vitreous degeneration, bilateral: Secondary | ICD-10-CM

## 2020-09-17 DIAGNOSIS — I10 Essential (primary) hypertension: Secondary | ICD-10-CM | POA: Diagnosis not present

## 2020-09-17 DIAGNOSIS — E113513 Type 2 diabetes mellitus with proliferative diabetic retinopathy with macular edema, bilateral: Secondary | ICD-10-CM

## 2020-09-17 DIAGNOSIS — E11311 Type 2 diabetes mellitus with unspecified diabetic retinopathy with macular edema: Secondary | ICD-10-CM | POA: Diagnosis not present

## 2020-09-17 DIAGNOSIS — H35372 Puckering of macula, left eye: Secondary | ICD-10-CM

## 2020-09-17 DIAGNOSIS — H35033 Hypertensive retinopathy, bilateral: Secondary | ICD-10-CM | POA: Diagnosis not present

## 2020-10-02 ENCOUNTER — Other Ambulatory Visit: Payer: Self-pay

## 2020-10-02 DIAGNOSIS — E785 Hyperlipidemia, unspecified: Secondary | ICD-10-CM | POA: Insufficient documentation

## 2020-10-02 DIAGNOSIS — R0609 Other forms of dyspnea: Secondary | ICD-10-CM | POA: Insufficient documentation

## 2020-10-02 DIAGNOSIS — R0600 Dyspnea, unspecified: Secondary | ICD-10-CM | POA: Insufficient documentation

## 2020-10-02 DIAGNOSIS — N289 Disorder of kidney and ureter, unspecified: Secondary | ICD-10-CM | POA: Insufficient documentation

## 2020-10-02 DIAGNOSIS — E119 Type 2 diabetes mellitus without complications: Secondary | ICD-10-CM | POA: Insufficient documentation

## 2020-10-02 DIAGNOSIS — I770 Arteriovenous fistula, acquired: Secondary | ICD-10-CM | POA: Insufficient documentation

## 2020-10-02 DIAGNOSIS — F32A Depression, unspecified: Secondary | ICD-10-CM | POA: Insufficient documentation

## 2020-10-02 DIAGNOSIS — M1990 Unspecified osteoarthritis, unspecified site: Secondary | ICD-10-CM | POA: Insufficient documentation

## 2020-10-02 DIAGNOSIS — J449 Chronic obstructive pulmonary disease, unspecified: Secondary | ICD-10-CM | POA: Insufficient documentation

## 2020-10-02 DIAGNOSIS — D649 Anemia, unspecified: Secondary | ICD-10-CM | POA: Insufficient documentation

## 2020-10-02 DIAGNOSIS — I10 Essential (primary) hypertension: Secondary | ICD-10-CM | POA: Insufficient documentation

## 2020-10-04 ENCOUNTER — Other Ambulatory Visit: Payer: Self-pay

## 2020-10-04 ENCOUNTER — Ambulatory Visit (INDEPENDENT_AMBULATORY_CARE_PROVIDER_SITE_OTHER): Payer: Medicare Other | Admitting: Cardiology

## 2020-10-04 ENCOUNTER — Encounter: Payer: Self-pay | Admitting: Cardiology

## 2020-10-04 VITALS — BP 162/68 | HR 72 | Ht 62.0 in | Wt 163.0 lb

## 2020-10-04 DIAGNOSIS — I491 Atrial premature depolarization: Secondary | ICD-10-CM

## 2020-10-04 DIAGNOSIS — I4719 Other supraventricular tachycardia: Secondary | ICD-10-CM

## 2020-10-04 DIAGNOSIS — I471 Supraventricular tachycardia: Secondary | ICD-10-CM | POA: Diagnosis not present

## 2020-10-04 DIAGNOSIS — N186 End stage renal disease: Secondary | ICD-10-CM

## 2020-10-04 DIAGNOSIS — I10 Essential (primary) hypertension: Secondary | ICD-10-CM | POA: Diagnosis not present

## 2020-10-04 DIAGNOSIS — E1122 Type 2 diabetes mellitus with diabetic chronic kidney disease: Secondary | ICD-10-CM | POA: Diagnosis not present

## 2020-10-04 DIAGNOSIS — Z794 Long term (current) use of insulin: Secondary | ICD-10-CM

## 2020-10-04 DIAGNOSIS — Z992 Dependence on renal dialysis: Secondary | ICD-10-CM

## 2020-10-04 HISTORY — DX: Atrial premature depolarization: I49.1

## 2020-10-04 HISTORY — DX: Other supraventricular tachycardia: I47.19

## 2020-10-04 NOTE — Patient Instructions (Signed)
Medication Instructions:  Your physician recommends that you continue on your current medications as directed. Please refer to the Current Medication list given to you today.  *If you need a refill on your cardiac medications before your next appointment, please call your pharmacy*   Lab Work: None If you have labs (blood work) drawn today and your tests are completely normal, you will receive your results only by: [place] [facility] MyChart Message (if you have MyChart) OR . A paper copy in the mail If you have any lab test that is abnormal or we need to change your treatment, we will call you to review the results.   Testing/Procedures: None   Follow-Up: At [facility], you and your health needs are our priority.  As part of our continuing mission to provide you with exceptional heart care, we have created designated Provider Care Teams.  These Care Teams include your primary Cardiologist (physician) and Advanced Practice Providers (APPs -  Physician Assistants and Nurse Practitioners) who all work together to provide you with the care you need, when you need it.  We recommend signing up for the patient portal called "MyChart".  Sign up information is provided on this After Visit Summary.  MyChart is used to connect with patients for Virtual Visits (Telemedicine).  Patients are able to view lab/test results, encounter notes, upcoming appointments, etc.  Non-urgent messages can be sent to your provider as well.   To learn more about what you can do with MyChart, go to [email or web address].    Your next appointment:   4 week(s)  The format for your next appointment:   In Person  Provider:   Berniece Salines, DO   Other Instructions Please have your dialysis center send [place] your blood pressure readings for the next month. Our fax number is [phone]

## 2020-10-04 NOTE — Progress Notes (Signed)
Cardiology Office Note:    Date:  10/04/2020   ID:  Anna Carr, Anna Carr 1941-07-18, MRN [id number]  PCP:  Raina Mina., MD  Cardiologist:  Berniece Salines, DO  Electrophysiologist:  None   Referring MD: Raina Mina., MD   Chief Complaint  Patient presents with  . Follow-up    Monitor    History of Present Illness:    Anna Carr is a 79 y.o. female with a hx of chronic diastolic heart failure, hypertension, grade 2 diastolic dysfunction,end-stage renal disease recently started hemodialysis on Tuesday Thursdays and Saturday presents for follow-up visit.  I last saw the patient on July 22, 2020 at that time she was here with her daughter she reported that she experiencing intermittent lightheadedness and dizziness.  She also noted some shortness of breath on nondialysis day. She is here today to discuss her monitor results.  She tells me that the dizziness has resolved she is not experiencing that.  She does still have some intermittent shortness of breath.  Past Medical History:  Diagnosis Date  . Acute on chronic diastolic congestive heart failure ([place]) 03/11/2019  . Acute systolic heart failure ([place]) 03/12/2019  . Anemia   . Anemia, chronic disease 01/23/2017  . AV fistula ([place])   . Benign hypertension with CKD (chronic kidney disease) stage V ([place]) 09/15/2018  . CHF with left ventricular diastolic dysfunction, NYHA class 1 ([place]) 02/02/2017  . Chronic diastolic heart failure ([place]) 11/22/2019  . Chronic kidney disease, stage 5 ([place]) 07/18/2019  . Colon polyp   . COPD mixed type ([place])   . Depression   . Dyspnea on exertion   . High risk medication use 12/22/2015  . Hyperlipidemia   . Hypertension   . Hypervolemia 11/13/2019  . Hypokalemia 03/11/2019  . Hyponatremia 03/11/2019  . Hypoxia 03/11/2019  . Insomnia   . Insulin long-term use ([place]) 12/22/2015  . Kidney disease   . Major depressive disorder, recurrent ([place]) 12/22/2015  . Malaise and fatigue  12/23/2015  . Osteoarthritis   . Osteopenia   . 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 SIADH (syndrome of inappropriate ADH production) ([place]) 03/29/2019  . Type 2 diabetes mellitus ([place])   . Type 2 diabetes mellitus, with long-term current use of insulin ([place]) 03/11/2019    Past Surgical History:  Procedure Laterality Date  . APPENDECTOMY    . AV FISTULA PLACEMENT Left   . HYSTEROSCOPY      Current Medications: Current Meds  Medication Sig  . acetaminophen (TYLENOL) 500 MG tablet Take 500 mg by mouth as needed.   Marland Kitchen amLODipine (NORVASC) 10 MG tablet TAKE 1 TABLET BY MOUTH  DAILY  . ascorbic acid (VITAMIN C) 1000 MG tablet Take 1,000 mg by mouth daily.  Marland Kitchen aspirin 81 MG EC tablet Take 81 mg by mouth daily.   . B Complex Vitamins (VITAMIN B-COMPLEX) TABS Take by mouth daily.  . Biotin 5 MG TBDP Take 5,000 mcg by mouth every other day.  . bisacodyl (DULCOLAX) 5 MG EC tablet Take 5 mg by mouth daily as needed.   . Calcium Polycarbophil (FIBER) 625 MG TABS Take 625 mg by mouth daily.  . carboxymethylcellulose (REFRESH PLUS) 0.5 % SOLN Place 1 drop into both eyes daily as needed.  . carvedilol (COREG) 6.25 MG tablet Take 6.25 mg by mouth 2 (two) times daily.  . Cholecalciferol 1.25 MG (50000 UT) TABS Take by mouth.  . cyanocobalamin 50 MCG tablet Take 50 mcg by mouth daily.   Marland Kitchen  Docusate Sodium 100 MG capsule Take 100 mg by mouth as needed.  . fluticasone (FLONASE) 50 MCG/ACT nasal spray Place 1 spray into both nostrils daily.  . furosemide (LASIX) 80 MG tablet Take 80 mg by mouth daily.  Marland Kitchen glimepiride (AMARYL) 4 MG tablet Take 4 mg by mouth daily.  . Insulin Glargine-Lixisenatide (SOLIQUA) 100-33 UNT-MCG/ML SOPN Inject 12 Units into the skin in the morning.  . iron sucrose in sodium chloride 0.9 % 100 mL once a week.  . Lactobacillus (PROBIOTIC ACIDOPHILUS PO) Take by mouth.  . lidocaine-prilocaine (EMLA) cream Apply 1 application topically as needed.  . Omega-3 Fatty Acids (FISH OIL) 1000 MG  CAPS 2 times daily.  . polyethylene glycol (MIRALAX / GLYCOLAX) 17 g packet Take 17 g by mouth daily.  . potassium chloride (KLOR-CON) 10 MEQ tablet Take 10 mEq by mouth daily.  . vitamin E 1000 UNIT capsule Take 1,000 Units by mouth daily.     Allergies:   Patient has no known allergies.   Social History   Socioeconomic History  . Marital status: Widowed    Spouse name: Not on file  . Number of children: Not on file  . Years of education: Not on file  . Highest education level: Not on file  Occupational History  . Not on file  Tobacco Use  . Smoking status: Former Smoker    Types: Cigarettes  . Smokeless tobacco: Never Used  Vaping Use  . Vaping Use: Never used  Substance and Sexual Activity  . Alcohol use: Not Currently  . Drug use: Never  . Sexual activity: Not on file  Other Topics Concern  . Not on file  Social History Narrative  . Not on file   Social Determinants of Health   Financial Resource Strain: Not on file  Food Insecurity: Not on file  Transportation Needs: Not on file  Physical Activity: Not on file  Stress: Not on file  Social Connections: Not on file     Family History: The patient's family history includes Diabetes in her maternal grandfather and mother; Hypertension in her mother.  ROS:   Review of Systems  Constitution: Negative for decreased appetite, fever and weight gain.  HENT: Negative for congestion, ear discharge, hoarse voice and sore throat.   Eyes: Negative for discharge, redness, vision loss in right eye and visual halos.  Cardiovascular: Negative for chest pain, dyspnea on exertion, leg swelling, orthopnea and palpitations.  Respiratory: Negative for cough, hemoptysis, shortness of breath and snoring.   Endocrine: Negative for heat intolerance and polyphagia.  Hematologic/Lymphatic: Negative for bleeding problem. Does not bruise/bleed easily.  Skin: Negative for flushing, nail changes, rash and suspicious lesions.   Musculoskeletal: Negative for arthritis, joint pain, muscle cramps, myalgias, neck pain and stiffness.  Gastrointestinal: Negative for abdominal pain, bowel incontinence, diarrhea and excessive appetite.  Genitourinary: Negative for decreased libido, genital sores and incomplete emptying.  Neurological: Negative for brief paralysis, focal weakness, headaches and loss of balance.  Psychiatric/Behavioral: Negative for altered mental status, depression and suicidal ideas.  Allergic/Immunologic: Negative for HIV exposure and persistent infections.    EKGs/Labs/Other Studies Reviewed:    The following studies were reviewed today:   EKG: None today  The patient wore the monitor for 7 days 22 hours starting July 22, 2020. Indication: Palpitations  The minimum heart rate was 41 bpm, maximum heart rate was 200 bpm, and average heart rate was 75 bpm. Predominant underlying rhythm was Sinus Rhythm.  57 Supraventricular Tachycardia runs  occurred, the run with the fastest interval lasting 7 beats with a maximum rate of 200 bpm, the longest lasting 11.4 seconds with an avg rate of 126 bpm.  Premature atrial complexes were frequent (8.1%, 38101). Premature Ventricular complexes were rare less than 1%.  No ventricular tachycardia, no pauses, No AV block and no atrial fibrillation present. 3 patient triggered events: All associated with premature atrial complex.  Conclusion: This study is remarkable for the following:                             1.  57 episodes supraventricular tachycardia which is likely atrial tachycardia with variable block.                             2.  Frequent premature atrial complex  (8.1%, 57240).   Recent Labs: No results found for requested labs within last 8760 hours.  Recent Lipid Panel No results found for: CHOL, TRIG, HDL, CHOLHDL, VLDL, LDLCALC, LDLDIRECT  Physical Exam:    VS:  BP (!) 162/68 (BP Location: Right Arm, Patient Position: Sitting,  Cuff Size: Normal)   Pulse 72   Ht 5\' 2"  (1.575 m)   Wt 163 lb (73.9 kg)   SpO2 99%   BMI 29.81 kg/m     Wt Readings from Last 3 Encounters:  10/04/20 163 lb (73.9 kg)  07/22/20 162 lb 9.6 oz (73.8 kg)  01/31/20 151 lb (68.5 kg)     GEN: Well nourished, well developed in no acute distress HEENT: Normal NECK: No JVD; No carotid bruits LYMPHATICS: No lymphadenopathy CARDIAC: S1S2 noted,RRR, no murmurs, rubs, gallops RESPIRATORY:  Clear to auscultation without rales, wheezing or rhonchi  ABDOMEN: Soft, non-tender, non-distended, +bowel sounds, no guarding. EXTREMITIES: No edema, No cyanosis, no clubbing MUSCULOSKELETAL:  No deformity  SKIN: Warm and dry NEUROLOGIC:  Alert and oriented x 3, non-focal PSYCHIATRIC:  Normal affect, good insight  ASSESSMENT:    1. Primary hypertension   2. Type 2 diabetes mellitus with chronic kidney disease on chronic dialysis, with long-term current use of insulin ([place])   3. PAT (paroxysmal atrial tachycardia) ([place])   4. PAC (premature atrial contraction)    PLAN:     Her monitor did show frequent PACs with 57 episodes of paroxysmal atrial tachycardia.  Ideally I like to switch the patient from carvedilol to acebutolol 200 mg twice daily.  But for right now she is apprehensive.  She is also is hypertensive in the office and I would like to adjust her medications but she tells me that her blood pressure at home and at dialysis is usually fine.  Staff asked the patient to have 1 month record of her blood pressure faxed to me from her dialysis center.  In addition she is going to take her blood pressure daily. I plan to see the patient back in 4 weeks. ESRD she is on hemodialysis Tuesday Thursday and Saturdays. Hypertension as described above. Diabetes mellitus-this is being managed by her primary care provider.  The patient is in agreement with the above plan. The patient left the office in stable condition.  The patient will follow up in 4 weeks  or sooner if needed.   Medication Adjustments/Labs and Tests Ordered: Current medicines are reviewed at length with the patient today.  Concerns regarding medicines are outlined above.  No orders of the defined types were placed in this  encounter.  No orders of the defined types were placed in this encounter.   Patient Instructions  Medication Instructions:  Your physician recommends that you continue on your current medications as directed. Please refer to the Current Medication list given to you today.  *If you need a refill on your cardiac medications before your next appointment, please call your pharmacy*   Lab Work: None If you have labs (blood work) drawn today and your tests are completely normal, you will receive your results only by: Marland Kitchen MyChart Message (if you have MyChart) OR . A paper copy in the mail If you have any lab test that is abnormal or we need to change your treatment, we will call you to review the results.   Testing/Procedures: None   Follow-Up: At [facility], you and your health needs are our priority.  As part of our continuing mission to provide you with exceptional heart care, we have created designated Provider Care Teams.  These Care Teams include your primary Cardiologist (physician) and Advanced Practice Providers (APPs -  Physician Assistants and Nurse Practitioners) who all work together to provide you with the care you need, when you need it.  We recommend signing up for the patient portal called "MyChart".  Sign up information is provided on this After Visit Summary.  MyChart is used to connect with patients for Virtual Visits (Telemedicine).  Patients are able to view lab/test results, encounter notes, upcoming appointments, etc.  Non-urgent messages can be sent to your provider as well.   To learn more about what you can do with MyChart, go to [email or web address].    Your next appointment:   4 week(s)  The format for your next  appointment:   In Person  Provider:   Berniece Salines, DO   Other Instructions Please have your dialysis center send [place] your blood pressure readings for the next month. Our fax number is [phone]     Adopting a Healthy Lifestyle.  Know what a healthy weight is for you (roughly BMI <25) and aim to maintain this   Aim for 7+ servings of fruits and vegetables daily   65-80+ fluid ounces of water or unsweet tea for healthy kidneys   Limit to max 1 drink of alcohol per day; avoid smoking/tobacco   Limit animal fats in diet for cholesterol and heart health - choose grass fed whenever available   Avoid highly processed foods, and foods high in saturated/trans fats   Aim for low stress - take time to unwind and care for your mental health   Aim for 150 min of moderate intensity exercise weekly for heart health, and weights twice weekly for bone health   Aim for 7-9 hours of sleep daily   When it comes to diets, agreement about the perfect plan isnt easy to find, even among the experts. Experts at the [place] developed an idea known as the Healthy Eating Plate. Just imagine a plate divided into logical, healthy portions.   The emphasis is on diet quality:   Load up on vegetables and fruits - one-half of your plate: Aim for color and variety, and remember that potatoes dont count.   Go for whole grains - one-quarter of your plate: Whole wheat, barley, wheat berries, quinoa, oats, brown rice, and foods made with them. If you want pasta, go with whole wheat pasta.   Protein power - one-quarter of your plate: Fish, chicken, beans, and nuts are all healthy, versatile protein sources.  Limit red meat.   The diet, however, does go beyond the plate, offering a few other suggestions.   Use healthy plant oils, such as olive, canola, soy, corn, sunflower and peanut. Check the labels, and avoid partially hydrogenated oil, which have unhealthy trans fats.   If youre  thirsty, drink water. Coffee and tea are good in moderation, but skip sugary drinks and limit milk and dairy products to one or two daily servings.   The type of carbohydrate in the diet is more important than the amount. Some sources of carbohydrates, such as vegetables, fruits, whole grains, and beans-are healthier than others.   Finally, stay active  Signed, Berniece Salines, DO  10/04/2020 3:39 PM    [facility]

## 2020-10-16 ENCOUNTER — Encounter (INDEPENDENT_AMBULATORY_CARE_PROVIDER_SITE_OTHER): Payer: Medicare Other | Admitting: Ophthalmology

## 2020-10-28 DIAGNOSIS — Z20822 Contact with and (suspected) exposure to covid-19: Secondary | ICD-10-CM | POA: Insufficient documentation

## 2020-10-28 DIAGNOSIS — R059 Cough, unspecified: Secondary | ICD-10-CM

## 2020-10-28 HISTORY — DX: Contact with and (suspected) exposure to covid-19: Z20.822

## 2020-10-28 HISTORY — DX: Cough, unspecified: R05.9

## 2020-10-29 DIAGNOSIS — U071 COVID-19: Secondary | ICD-10-CM | POA: Insufficient documentation

## 2020-10-29 HISTORY — DX: COVID-19: U07.1

## 2020-10-30 ENCOUNTER — Encounter (INDEPENDENT_AMBULATORY_CARE_PROVIDER_SITE_OTHER): Payer: Medicare Other | Admitting: Ophthalmology

## 2020-11-01 ENCOUNTER — Ambulatory Visit: Payer: Medicare Other | Admitting: Cardiology

## 2020-12-18 ENCOUNTER — Encounter (INDEPENDENT_AMBULATORY_CARE_PROVIDER_SITE_OTHER): Payer: Medicare Other | Admitting: Ophthalmology

## 2020-12-23 ENCOUNTER — Encounter (INDEPENDENT_AMBULATORY_CARE_PROVIDER_SITE_OTHER): Payer: Medicare Other | Admitting: Ophthalmology

## 2020-12-25 ENCOUNTER — Other Ambulatory Visit: Payer: Self-pay

## 2020-12-25 ENCOUNTER — Encounter (INDEPENDENT_AMBULATORY_CARE_PROVIDER_SITE_OTHER): Payer: Medicare Other | Admitting: Ophthalmology

## 2020-12-25 DIAGNOSIS — I10 Essential (primary) hypertension: Secondary | ICD-10-CM

## 2020-12-25 DIAGNOSIS — H35033 Hypertensive retinopathy, bilateral: Secondary | ICD-10-CM

## 2020-12-25 DIAGNOSIS — E113513 Type 2 diabetes mellitus with proliferative diabetic retinopathy with macular edema, bilateral: Secondary | ICD-10-CM | POA: Diagnosis not present

## 2020-12-25 DIAGNOSIS — H43813 Vitreous degeneration, bilateral: Secondary | ICD-10-CM

## 2021-01-22 ENCOUNTER — Other Ambulatory Visit: Payer: Self-pay

## 2021-01-22 ENCOUNTER — Other Ambulatory Visit: Payer: Self-pay | Admitting: *Deleted

## 2021-01-22 ENCOUNTER — Encounter (INDEPENDENT_AMBULATORY_CARE_PROVIDER_SITE_OTHER): Payer: Medicare Other | Admitting: Ophthalmology

## 2021-01-22 DIAGNOSIS — E113513 Type 2 diabetes mellitus with proliferative diabetic retinopathy with macular edema, bilateral: Secondary | ICD-10-CM | POA: Diagnosis not present

## 2021-01-22 DIAGNOSIS — Z992 Dependence on renal dialysis: Secondary | ICD-10-CM

## 2021-01-22 DIAGNOSIS — H43813 Vitreous degeneration, bilateral: Secondary | ICD-10-CM

## 2021-01-22 DIAGNOSIS — H35372 Puckering of macula, left eye: Secondary | ICD-10-CM

## 2021-01-22 DIAGNOSIS — H35033 Hypertensive retinopathy, bilateral: Secondary | ICD-10-CM | POA: Diagnosis not present

## 2021-01-22 DIAGNOSIS — N186 End stage renal disease: Secondary | ICD-10-CM

## 2021-01-22 DIAGNOSIS — I10 Essential (primary) hypertension: Secondary | ICD-10-CM

## 2021-01-31 ENCOUNTER — Ambulatory Visit (HOSPITAL_COMMUNITY)
Admission: RE | Admit: 2021-01-31 | Discharge: 2021-01-31 | Disposition: A | Discharge status: Home / Self Care | Payer: Medicare Other | Source: Ambulatory Visit | Attending: Vascular Surgery | Admitting: Vascular Surgery

## 2021-01-31 ENCOUNTER — Ambulatory Visit (INDEPENDENT_AMBULATORY_CARE_PROVIDER_SITE_OTHER)
Admission: RE | Admit: 2021-01-31 | Discharge: 2021-01-31 | Disposition: A | Discharge status: Home / Self Care | Payer: Medicare Other | Source: Ambulatory Visit | Attending: Vascular Surgery | Admitting: Vascular Surgery

## 2021-01-31 ENCOUNTER — Other Ambulatory Visit: Payer: Self-pay

## 2021-01-31 ENCOUNTER — Ambulatory Visit: Payer: Medicare Other | Admitting: Vascular Surgery

## 2021-01-31 ENCOUNTER — Encounter: Payer: Self-pay | Admitting: Vascular Surgery

## 2021-01-31 VITALS — BP 150/70 | HR 68 | Temp 98.0°F | Resp 20 | Ht 62.0 in | Wt 163.0 lb

## 2021-01-31 DIAGNOSIS — N186 End stage renal disease: Secondary | ICD-10-CM | POA: Diagnosis not present

## 2021-01-31 DIAGNOSIS — Z992 Dependence on renal dialysis: Secondary | ICD-10-CM | POA: Diagnosis present

## 2021-01-31 MED ORDER — SODIUM CHLORIDE 0.9% FLUSH: 3.0000 mL | Freq: Two times a day (BID) | INTRAVENOUS | Status: DC

## 2021-01-31 MED ORDER — SODIUM CHLORIDE 0.9 % IV SOLN: 250.0000 mL | INTRAVENOUS | Status: DC | PRN

## 2021-01-31 MED ORDER — SODIUM CHLORIDE 0.9% FLUSH: 3.0000 mL | INTRAVENOUS | Status: DC | PRN

## 2021-01-31 NOTE — Progress Notes (Signed)
Patient ID: Anna Carr, female   DOB: 1941/03/21, 80 y.o.   MRN: [id number]  Reason for Consult: New Patient (Initial Visit)   Referred by Raina Mina., MD  Subjective:     HPI:  Anna Carr is a 80 y.o. female history of end-stage renal.  Currently dialyzing via catheter.  She has a history of a fistula placed on September 27, 2019.  She states she has had 2 previous fistulogram was performed [facility] last year.  Unfortunately the vein is not matured to suitable use.  She is currently dialyzing via catheter.  She does not take any blood thinners.  She is right-hand dominant has never had any intervention of the right upper extremity.  Past Medical History:  Diagnosis Date  . Acute on chronic diastolic congestive heart failure ([place]) 03/11/2019  . Acute systolic heart failure ([place]) 03/12/2019  . Anemia   . Anemia, chronic disease 01/23/2017  . AV fistula ([place])   . Benign hypertension with CKD (chronic kidney disease) stage V ([place]) 09/15/2018  . CHF with left ventricular diastolic dysfunction, NYHA class 1 ([place]) 02/02/2017  . Chronic diastolic heart failure ([place]) 11/22/2019  . Chronic kidney disease, stage 5 ([place]) 07/18/2019  . Colon polyp   . COPD mixed type ([place])   . Depression   . Dyspnea on exertion   . High risk medication use 12/22/2015  . Hyperlipidemia   . Hypertension   . Hypervolemia 11/13/2019  . Hypokalemia 03/11/2019  . Hyponatremia 03/11/2019  . Hypoxia 03/11/2019  . Insomnia   . Insulin long-term use ([place]) 12/22/2015  . Kidney disease   . Major depressive disorder, recurrent ([place]) 12/22/2015  . Malaise and fatigue 12/23/2015  . Osteoarthritis   . Osteopenia   . 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 SIADH (syndrome of inappropriate ADH production) ([place]) 03/29/2019  . Type 2 diabetes mellitus ([place])   . Type 2 diabetes mellitus, with long-term current use of insulin ([place]) 03/11/2019   Family History  Problem Relation Age of Onset  . Diabetes Mother    . Hypertension Mother   . Diabetes Maternal Grandfather    Past Surgical History:  Procedure Laterality Date  . APPENDECTOMY    . AV FISTULA PLACEMENT Left   . HYSTEROSCOPY      Short Social History:  Social History   Tobacco Use  . Smoking status: Former Smoker    Types: Cigarettes  . Smokeless tobacco: Never Used  Substance Use Topics  . Alcohol use: Not Currently    No Known Allergies  Current Outpatient Medications  Medication Sig Dispense Refill  . acetaminophen (TYLENOL) 500 MG tablet Take 500 mg by mouth as needed.     Marland Kitchen amLODipine (NORVASC) 10 MG tablet TAKE 1 TABLET BY MOUTH  DAILY    . ascorbic acid (VITAMIN C) 1000 MG tablet Take 1,000 mg by mouth daily.    Marland Kitchen aspirin 81 MG EC tablet Take 81 mg by mouth daily.     . B Complex Vitamins (VITAMIN B-COMPLEX) TABS Take by mouth daily.    . Biotin 5 MG TBDP Take 5,000 mcg by mouth every other day.    . bisacodyl (DULCOLAX) 5 MG EC tablet Take 5 mg by mouth daily as needed.     . Calcium Polycarbophil (FIBER) 625 MG TABS Take 625 mg by mouth daily.    . carboxymethylcellulose (REFRESH PLUS) 0.5 % SOLN Place 1 drop into both eyes daily as needed.    . carvedilol (  COREG) 6.25 MG tablet Take 6.25 mg by mouth 2 (two) times daily.    . Cholecalciferol 1.25 MG (50000 UT) TABS Take by mouth.    . cyanocobalamin 50 MCG tablet Take 50 mcg by mouth daily.     Mariane Baumgarten Sodium 100 MG capsule Take 100 mg by mouth as needed.    . fluticasone (FLONASE) 50 MCG/ACT nasal spray Place 1 spray into both nostrils daily.    . furosemide (LASIX) 80 MG tablet Take 80 mg by mouth daily.    Marland Kitchen glimepiride (AMARYL) 4 MG tablet Take 4 mg by mouth 2 (two) times daily.    . Insulin Glargine-Lixisenatide (SOLIQUA) 100-33 UNT-MCG/ML SOPN Inject 12 Units into the skin in the morning.    . iron sucrose in sodium chloride 0.9 % 100 mL once a week.    . Lactobacillus (PROBIOTIC ACIDOPHILUS PO) Take by mouth.    . lidocaine-prilocaine (EMLA) cream Apply  1 application topically as needed.    . Omega-3 Fatty Acids (FISH OIL) 1000 MG CAPS 2 times daily.    . polyethylene glycol (MIRALAX / GLYCOLAX) 17 g packet Take 17 g by mouth daily.    . potassium chloride (KLOR-CON) 10 MEQ tablet Take 10 mEq by mouth daily.    . vitamin E 1000 UNIT capsule Take 1,000 Units by mouth daily.     No current facility-administered medications for this visit.    Review of Systems  Constitutional:  Constitutional negative. HENT: HENT negative.  Eyes: Eyes negative.  Respiratory: Respiratory negative.  Cardiovascular: Cardiovascular negative.  GI: Gastrointestinal negative.  Musculoskeletal: Musculoskeletal negative.  Skin: Skin negative.  Neurological: Neurological negative. Hematologic: Hematologic/lymphatic negative.  Psychiatric: Psychiatric negative.        Objective:  Objective   Vitals:   01/31/21 1141  BP: (!) 150/70  Pulse: 68  Resp: 20  Temp: 98 F (36.7 C)  SpO2: 96%    Physical Exam HENT:     Head: Normocephalic.     Nose:     Comments: Wearing a mask Eyes:     Pupils: Pupils are equal, round, and reactive to light.  Cardiovascular:     Rate and Rhythm: Normal rate.  Pulmonary:     Effort: Pulmonary effort is normal.  Abdominal:     General: Abdomen is flat.  Musculoskeletal:        General: Normal range of motion.     Comments: Thrill in left arm  Skin:    Capillary Refill: Capillary refill takes less than 2 seconds.  Neurological:     General: No focal deficit present.     Mental Status: She is alert.  Psychiatric:        Mood and Affect: Mood normal.        Behavior: Behavior normal.        Thought Content: Thought content normal.        Judgment: Judgment normal.     Data: Right Cephalic  Diameter (cm)Depth (cm)Findings  +-----------------+-------------+----------+--------+  Shoulder       0.39              +-----------------+-------------+----------+--------+  Prox upper arm     0.40              +-----------------+-------------+----------+--------+  Mid upper arm    0.38              +-----------------+-------------+----------+--------+  Dist upper arm    0.35              +-----------------+-------------+----------+--------+  Antecubital fossa  0.43              +-----------------+-------------+----------+--------+  Prox forearm     0.29              +-----------------+-------------+----------+--------+  Mid forearm     0.27              +-----------------+-------------+----------+--------+  Dist forearm     0.18              +-----------------+-------------+----------+--------+   +-----------------+-------------+----------+--------+  Right Basilic  Diameter (cm)Depth (cm)Findings  +-----------------+-------------+----------+--------+  Prox upper arm    0.41              +-----------------+-------------+----------+--------+  Mid upper arm    0.47              +-----------------+-------------+----------+--------+  Dist upper arm    0.31              +-----------------+-------------+----------+--------+  Antecubital fossa  0.39              +-----------------+-------------+----------+--------+   +-----------------+-------------+----------+--------+  Left Basilic   Diameter (cm)Depth (cm)Findings  +-----------------+-------------+----------+--------+  Mid upper arm    0.54              +-----------------+-------------+----------+--------+  Dist upper arm    0.38              +-----------------+-------------+----------+--------+  Antecubital fossa  0.44              +-----------------+-------------+----------+--------+   Summary: Right: Patent cephalic and basilic veins.   Left: Patent basilic vein.    BCAVF is patent with the smallest diameter of 0.65 cm.    Tortuous at the anastomosis with a narrowing of the vein in    this area. Velocity of 625 cm/s. Anastomosis velocity of 561    cm/s.     Right Pre-Dialysis Findings:  +-----------------------+----------+--------------------+---------+--------  +  Location        PSV (cm/s)Intralum. Diam. (cm)Waveform  Comments  +-----------------------+----------+--------------------+---------+--------  +  Brachial Antecub. fossa115    0.49        triphasic        +-----------------------+----------+--------------------+---------+--------  +  Radial Art at Wrist  80    0.26        biphasic         +-----------------------+----------+--------------------+---------+--------  +  Ulnar Art at Wrist   85    0.21        biphasic         +-----------------------+----------+--------------------+---------+--------  +       Left Pre-Dialysis Findings:  +------------------+----------+------------------+--------+----------------  ----+  Location     PSV (cm/s)Intralum. Diam.  WaveformComments                       (cm)                        +------------------+----------+------------------+--------+----------------  ----+  Brachial Antecub. 207    0.55           Waveform  consistent   fossa                         with AVF.        +------------------+----------+------------------+--------+----------------  ----+  Radial Art at   63    0.20       biphasic             Wrist                                      +------------------+----------+------------------+--------+----------------  ----+  Ulnar Art at Wrist27     0.16       biphasic             +------------------+----------+------------------+--------+----------------  ----+      Assessment/Plan:     80 year old female with end-stage renal disease dialyzing via catheter.  She has a fistula in the left upper extremity does have a strong thrill may be too small for access.  We will begin with fistulogram this may be the plan interposition graft.  She would like to not have access placed in the right arm if at all possible.  We discussed the risk benefits alternatives she demonstrates good understanding we will get her scheduled on a nondialysis day in the near future.  Waynetta Sandy MD [facility]

## 2021-01-31 NOTE — H&P (View-Only) (Signed)
Patient ID: Anna Carr, female   DOB: 1940/12/15, 80 y.o.   MRN: [id number]  Reason for Consult: New Patient (Initial Visit)   Referred by Raina Mina., MD  Subjective:     HPI:  Anna Carr is a 80 y.o. female history of end-stage renal.  Currently dialyzing via catheter.  She has a history of a fistula placed on September 27, 2019.  She states she has had 2 previous fistulogram was performed [facility] last year.  Unfortunately the vein is not matured to suitable use.  She is currently dialyzing via catheter.  She does not take any blood thinners.  She is right-hand dominant has never had any intervention of the right upper extremity.  Past Medical History:  Diagnosis Date  . Acute on chronic diastolic congestive heart failure ([place]) 03/11/2019  . Acute systolic heart failure ([place]) 03/12/2019  . Anemia   . Anemia, chronic disease 01/23/2017  . AV fistula ([place])   . Benign hypertension with CKD (chronic kidney disease) stage V ([place]) 09/15/2018  . CHF with left ventricular diastolic dysfunction, NYHA class 1 ([place]) 02/02/2017  . Chronic diastolic heart failure ([place]) 11/22/2019  . Chronic kidney disease, stage 5 ([place]) 07/18/2019  . Colon polyp   . COPD mixed type ([place])   . Depression   . Dyspnea on exertion   . High risk medication use 12/22/2015  . Hyperlipidemia   . Hypertension   . Hypervolemia 11/13/2019  . Hypokalemia 03/11/2019  . Hyponatremia 03/11/2019  . Hypoxia 03/11/2019  . Insomnia   . Insulin long-term use ([place]) 12/22/2015  . Kidney disease   . Major depressive disorder, recurrent ([place]) 12/22/2015  . Malaise and fatigue 12/23/2015  . Osteoarthritis   . Osteopenia   . 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 SIADH (syndrome of inappropriate ADH production) ([place]) 03/29/2019  . Type 2 diabetes mellitus ([place])   . Type 2 diabetes mellitus, with long-term current use of insulin ([place]) 03/11/2019   Family History  Problem Relation Age of Onset  . Diabetes Mother    . Hypertension Mother   . Diabetes Maternal Grandfather    Past Surgical History:  Procedure Laterality Date  . APPENDECTOMY    . AV FISTULA PLACEMENT Left   . HYSTEROSCOPY      Short Social History:  Social History   Tobacco Use  . Smoking status: Former Smoker    Types: Cigarettes  . Smokeless tobacco: Never Used  Substance Use Topics  . Alcohol use: Not Currently    No Known Allergies  Current Outpatient Medications  Medication Sig Dispense Refill  . acetaminophen (TYLENOL) 500 MG tablet Take 500 mg by mouth as needed.     Marland Kitchen amLODipine (NORVASC) 10 MG tablet TAKE 1 TABLET BY MOUTH  DAILY    . ascorbic acid (VITAMIN C) 1000 MG tablet Take 1,000 mg by mouth daily.    Marland Kitchen aspirin 81 MG EC tablet Take 81 mg by mouth daily.     . B Complex Vitamins (VITAMIN B-COMPLEX) TABS Take by mouth daily.    . Biotin 5 MG TBDP Take 5,000 mcg by mouth every other day.    . bisacodyl (DULCOLAX) 5 MG EC tablet Take 5 mg by mouth daily as needed.     . Calcium Polycarbophil (FIBER) 625 MG TABS Take 625 mg by mouth daily.    . carboxymethylcellulose (REFRESH PLUS) 0.5 % SOLN Place 1 drop into both eyes daily as needed.    . carvedilol (  COREG) 6.25 MG tablet Take 6.25 mg by mouth 2 (two) times daily.    . Cholecalciferol 1.25 MG (50000 UT) TABS Take by mouth.    . cyanocobalamin 50 MCG tablet Take 50 mcg by mouth daily.     Mariane Baumgarten Sodium 100 MG capsule Take 100 mg by mouth as needed.    . fluticasone (FLONASE) 50 MCG/ACT nasal spray Place 1 spray into both nostrils daily.    . furosemide (LASIX) 80 MG tablet Take 80 mg by mouth daily.    Marland Kitchen glimepiride (AMARYL) 4 MG tablet Take 4 mg by mouth 2 (two) times daily.    . Insulin Glargine-Lixisenatide (SOLIQUA) 100-33 UNT-MCG/ML SOPN Inject 12 Units into the skin in the morning.    . iron sucrose in sodium chloride 0.9 % 100 mL once a week.    . Lactobacillus (PROBIOTIC ACIDOPHILUS PO) Take by mouth.    . lidocaine-prilocaine (EMLA) cream Apply  1 application topically as needed.    . Omega-3 Fatty Acids (FISH OIL) 1000 MG CAPS 2 times daily.    . polyethylene glycol (MIRALAX / GLYCOLAX) 17 g packet Take 17 g by mouth daily.    . potassium chloride (KLOR-CON) 10 MEQ tablet Take 10 mEq by mouth daily.    . vitamin E 1000 UNIT capsule Take 1,000 Units by mouth daily.     No current facility-administered medications for this visit.    Review of Systems  Constitutional:  Constitutional negative. HENT: HENT negative.  Eyes: Eyes negative.  Respiratory: Respiratory negative.  Cardiovascular: Cardiovascular negative.  GI: Gastrointestinal negative.  Musculoskeletal: Musculoskeletal negative.  Skin: Skin negative.  Neurological: Neurological negative. Hematologic: Hematologic/lymphatic negative.  Psychiatric: Psychiatric negative.        Objective:  Objective   Vitals:   01/31/21 1141  BP: (!) 150/70  Pulse: 68  Resp: 20  Temp: 98 F (36.7 C)  SpO2: 96%    Physical Exam HENT:     Head: Normocephalic.     Nose:     Comments: Wearing a mask Eyes:     Pupils: Pupils are equal, round, and reactive to light.  Cardiovascular:     Rate and Rhythm: Normal rate.  Pulmonary:     Effort: Pulmonary effort is normal.  Abdominal:     General: Abdomen is flat.  Musculoskeletal:        General: Normal range of motion.     Comments: Thrill in left arm  Skin:    Capillary Refill: Capillary refill takes less than 2 seconds.  Neurological:     General: No focal deficit present.     Mental Status: She is alert.  Psychiatric:        Mood and Affect: Mood normal.        Behavior: Behavior normal.        Thought Content: Thought content normal.        Judgment: Judgment normal.     Data: Right Cephalic  Diameter (cm)Depth (cm)Findings  +-----------------+-------------+----------+--------+  Shoulder       0.39              +-----------------+-------------+----------+--------+  Prox upper arm     0.40              +-----------------+-------------+----------+--------+  Mid upper arm    0.38              +-----------------+-------------+----------+--------+  Dist upper arm    0.35              +-----------------+-------------+----------+--------+  Antecubital fossa  0.43              +-----------------+-------------+----------+--------+  Prox forearm     0.29              +-----------------+-------------+----------+--------+  Mid forearm     0.27              +-----------------+-------------+----------+--------+  Dist forearm     0.18              +-----------------+-------------+----------+--------+   +-----------------+-------------+----------+--------+  Right Basilic  Diameter (cm)Depth (cm)Findings  +-----------------+-------------+----------+--------+  Prox upper arm    0.41              +-----------------+-------------+----------+--------+  Mid upper arm    0.47              +-----------------+-------------+----------+--------+  Dist upper arm    0.31              +-----------------+-------------+----------+--------+  Antecubital fossa  0.39              +-----------------+-------------+----------+--------+   +-----------------+-------------+----------+--------+  Left Basilic   Diameter (cm)Depth (cm)Findings  +-----------------+-------------+----------+--------+  Mid upper arm    0.54              +-----------------+-------------+----------+--------+  Dist upper arm    0.38              +-----------------+-------------+----------+--------+  Antecubital fossa  0.44              +-----------------+-------------+----------+--------+   Summary: Right: Patent cephalic and basilic veins.   Left: Patent basilic vein.    BCAVF is patent with the smallest diameter of 0.65 cm.    Tortuous at the anastomosis with a narrowing of the vein in    this area. Velocity of 625 cm/s. Anastomosis velocity of 561    cm/s.     Right Pre-Dialysis Findings:  +-----------------------+----------+--------------------+---------+--------  +  Location        PSV (cm/s)Intralum. Diam. (cm)Waveform  Comments  +-----------------------+----------+--------------------+---------+--------  +  Brachial Antecub. fossa115    0.49        triphasic        +-----------------------+----------+--------------------+---------+--------  +  Radial Art at Wrist  80    0.26        biphasic         +-----------------------+----------+--------------------+---------+--------  +  Ulnar Art at Wrist   85    0.21        biphasic         +-----------------------+----------+--------------------+---------+--------  +       Left Pre-Dialysis Findings:  +------------------+----------+------------------+--------+----------------  ----+  Location     PSV (cm/s)Intralum. Diam.  WaveformComments                       (cm)                        +------------------+----------+------------------+--------+----------------  ----+  Brachial Antecub. 207    0.55           Waveform  consistent   fossa                         with AVF.        +------------------+----------+------------------+--------+----------------  ----+  Radial Art at   63    0.20       biphasic             Wrist                                      +------------------+----------+------------------+--------+----------------  ----+  Ulnar Art at Wrist27     0.16       biphasic             +------------------+----------+------------------+--------+----------------  ----+      Assessment/Plan:     80 year old female with end-stage renal disease dialyzing via catheter.  She has a fistula in the left upper extremity does have a strong thrill may be too small for access.  We will begin with fistulogram this may be the plan interposition graft.  She would like to not have access placed in the right arm if at all possible.  We discussed the risk benefits alternatives she demonstrates good understanding we will get her scheduled on a nondialysis day in the near future.  Waynetta Sandy MD [facility]

## 2021-02-14 ENCOUNTER — Other Ambulatory Visit (HOSPITAL_COMMUNITY)
Admission: RE | Admit: 2021-02-14 | Discharge: 2021-02-14 | Disposition: A | Discharge status: Home / Self Care | Payer: Medicare Other | Source: Ambulatory Visit | Attending: Vascular Surgery | Admitting: Vascular Surgery

## 2021-02-14 DIAGNOSIS — Z20822 Contact with and (suspected) exposure to covid-19: Secondary | ICD-10-CM | POA: Diagnosis not present

## 2021-02-14 DIAGNOSIS — Z01812 Encounter for preprocedural laboratory examination: Secondary | ICD-10-CM | POA: Diagnosis present

## 2021-02-15 LAB — SARS CORONAVIRUS 2 (TAT 6-24 HRS): SARS Coronavirus 2: NEGATIVE

## 2021-02-17 ENCOUNTER — Ambulatory Visit (HOSPITAL_COMMUNITY)
Admission: RE | Disposition: A | Discharge status: Home / Self Care | Payer: Self-pay | Source: Home / Self Care | Attending: Vascular Surgery

## 2021-02-17 ENCOUNTER — Ambulatory Visit (HOSPITAL_COMMUNITY)
Admission: RE | Admit: 2021-02-17 | Discharge: 2021-02-17 | Disposition: A | Discharge status: Home / Self Care | Payer: Medicare Other | Attending: Vascular Surgery | Admitting: Vascular Surgery

## 2021-02-17 ENCOUNTER — Other Ambulatory Visit: Payer: Self-pay

## 2021-02-17 DIAGNOSIS — Z794 Long term (current) use of insulin: Secondary | ICD-10-CM | POA: Insufficient documentation

## 2021-02-17 DIAGNOSIS — Y841 Kidney dialysis as the cause of abnormal reaction of the patient, or of later complication, without mention of misadventure at the time of the procedure: Secondary | ICD-10-CM | POA: Insufficient documentation

## 2021-02-17 DIAGNOSIS — E1122 Type 2 diabetes mellitus with diabetic chronic kidney disease: Secondary | ICD-10-CM | POA: Diagnosis not present

## 2021-02-17 DIAGNOSIS — I132 Hypertensive heart and chronic kidney disease with heart failure and with stage 5 chronic kidney disease, or end stage renal disease: Secondary | ICD-10-CM | POA: Insufficient documentation

## 2021-02-17 DIAGNOSIS — N185 Chronic kidney disease, stage 5: Secondary | ICD-10-CM | POA: Diagnosis not present

## 2021-02-17 DIAGNOSIS — Z992 Dependence on renal dialysis: Secondary | ICD-10-CM | POA: Insufficient documentation

## 2021-02-17 DIAGNOSIS — T82858A Stenosis of vascular prosthetic devices, implants and grafts, initial encounter: Secondary | ICD-10-CM | POA: Diagnosis not present

## 2021-02-17 DIAGNOSIS — Z79899 Other long term (current) drug therapy: Secondary | ICD-10-CM | POA: Insufficient documentation

## 2021-02-17 DIAGNOSIS — I5032 Chronic diastolic (congestive) heart failure: Secondary | ICD-10-CM | POA: Insufficient documentation

## 2021-02-17 DIAGNOSIS — Z7984 Long term (current) use of oral hypoglycemic drugs: Secondary | ICD-10-CM | POA: Insufficient documentation

## 2021-02-17 DIAGNOSIS — Z7982 Long term (current) use of aspirin: Secondary | ICD-10-CM | POA: Diagnosis not present

## 2021-02-17 DIAGNOSIS — Z87891 Personal history of nicotine dependence: Secondary | ICD-10-CM | POA: Diagnosis not present

## 2021-02-17 DIAGNOSIS — N186 End stage renal disease: Secondary | ICD-10-CM | POA: Diagnosis not present

## 2021-02-17 DIAGNOSIS — T82898A Other specified complication of vascular prosthetic devices, implants and grafts, initial encounter: Secondary | ICD-10-CM | POA: Diagnosis not present

## 2021-02-17 HISTORY — PX: A/V FISTULAGRAM: CATH118298

## 2021-02-17 LAB — POCT I-STAT, CHEM 8
BUN: 51 mg/dL — ABNORMAL HIGH (ref 8–23)
Calcium, Ion: 1.22 mmol/L (ref 1.15–1.40)
Chloride: 99 mmol/L (ref 98–111)
Creatinine, Ser: 5 mg/dL — ABNORMAL HIGH (ref 0.44–1.00)
Glucose, Bld: 156 mg/dL — ABNORMAL HIGH (ref 70–99)
HCT: 34 % — ABNORMAL LOW (ref 36.0–46.0)
Hemoglobin: 11.6 g/dL — ABNORMAL LOW (ref 12.0–15.0)
Potassium: 4.2 mmol/L (ref 3.5–5.1)
Sodium: 133 mmol/L — ABNORMAL LOW (ref 135–145)
TCO2: 24 mmol/L (ref 22–32)

## 2021-02-17 LAB — GLUCOSE, CAPILLARY: Glucose-Capillary: 103 mg/dL — ABNORMAL HIGH (ref 70–99)

## 2021-02-17 SURGERY — A/V FISTULAGRAM
Anesthesia: LOCAL | Laterality: Left

## 2021-02-17 MED ORDER — LIDOCAINE HCL (PF) 1 % IJ SOLN
INTRAMUSCULAR | Status: AC
  Filled 2021-02-17: qty 30

## 2021-02-17 MED ORDER — LIDOCAINE HCL (PF) 1 % IJ SOLN
INTRAMUSCULAR | Status: DC | PRN
  Administered 2021-02-17: 2 mL

## 2021-02-17 MED ORDER — IODIXANOL 320 MG/ML IV SOLN
INTRAVENOUS | Status: DC | PRN
  Administered 2021-02-17: 30 mL

## 2021-02-17 MED ORDER — HEPARIN (PORCINE) IN NACL 1000-0.9 UT/500ML-% IV SOLN
INTRAVENOUS | Status: AC
  Filled 2021-02-17: qty 500

## 2021-02-17 SURGICAL SUPPLY — 9 items
BAG SNAP BAND KOVER 36X36 (MISCELLANEOUS) ×2 IMPLANT
COVER DOME SNAP 22 D (MISCELLANEOUS) ×2 IMPLANT
KIT MICROPUNCTURE NIT STIFF (SHEATH) ×2 IMPLANT
PROTECTION STATION PRESSURIZED (MISCELLANEOUS) ×2
SHEATH PROBE COVER 6X72 (BAG) ×2 IMPLANT
STATION PROTECTION PRESSURIZED (MISCELLANEOUS) ×1 IMPLANT
STOPCOCK MORSE 400PSI 3WAY (MISCELLANEOUS) ×2 IMPLANT
TRAY PV CATH (CUSTOM PROCEDURE TRAY) ×2 IMPLANT
TUBING CIL FLEX 10 FLL-RA (TUBING) ×2 IMPLANT

## 2021-02-17 NOTE — Discharge Instructions (Signed)
AV Fistula Placement, Care After The following information offers guidance on how to care for yourself after your procedure. Your health care provider may also give you more specific instructions. If you have problems or questions, contact your health care provider. What can I expect after the procedure? After the procedure, it is common to have:  Soreness at the fistula site.  Vibration (thrill) over the fistula. Follow these instructions at home: Medicines  Take over-the-counter and prescription medicines only as told by your health care provider.  Ask your health care provider if the medicine prescribed to you can cause constipation. You may need to take these actions to prevent or treat constipation: ? Drink enough fluid to keep your urine pale yellow. ? Take over-the-counter or prescription medicines. ? Eat foods that are high in fiber, such as beans, whole grains, and fresh fruits and vegetables. ? Limit foods that are high in fat and processed sugars, such as fried or sweet foods. Incision care Follow instructions from your health care provider about how to take care of your incision. Make sure you:  Wash your hands with soap and water for at least 20 seconds before and after you change your bandage (dressing). If soap and water are not available, use hand sanitizer.  Change your dressing as told by your health care provider.  Leave stitches (sutures), skin glue, or adhesive strips in place. These skin closures may need to stay in place for 2 weeks or longer. If adhesive strip edges start to loosen and curl up, you may trim the loose edges. Do not remove adhesive strips completely unless your health care provider tells you to do that.   Fistula care  Check your fistula site every day to make sure the thrill feels the same.  Check your fistula site every day for signs of infection. Check for: ? More redness, swelling, or pain. ? Fluid or blood. ? Warmth. ? Pus or a bad  smell.  Raise (elevate) the affected area above the level of your heart while you are sitting or lying down.  Do not lift anything that is heavier than 10 lb (4.5 kg), or the limit that you are told, until your health care provider says that it is safe.  Do not lie down on your fistula arm.  Do not let anyone draw blood or take a blood pressure reading on your fistula arm. This is important.  Do not wear tight jewelry or clothing over your fistula arm.   Bathing  Do not take baths, swim, or use a hot tub until your health care provider approves. Ask your health care provider if you may take showers. You may only be allowed to take sponge baths.  Keep the area around your incision clean and dry. General instructions  Rest at home for a day or two.  If you were given a sedative during the procedure, it can affect you for several hours. Do not drive or operate machinery until your health care provider says that it is safe.  Return to your normal activities as told. Ask your health care provider what activities are safe for you.  Keep all follow-up visits. This is important. Contact a health care provider if:  You have more redness, swelling, or pain around your fistula site.  Your fistula site feels warm to the touch.  You have pus or a bad smell coming from your fistula site.  You have a fever or chills.  You feel numb or cold in  your arm or your fistula site.  You feel a decrease or a change in the thrill over the fistula. Get help right away if:  You have bleeding from your fistula site that will not stop.  You have chest pain.  You have trouble breathing. These symptoms may represent a serious problem that is an emergency. Do not wait to see if the symptoms will go away. Get medical help right away. Call your local emergency services (911 in the [place] Do not drive yourself to the hospital. Summary  Follow instructions from your health care provider about how to take  care of your incision.  Do not let anyone draw blood or take a blood pressure reading on your fistula arm. This is important.  Contact a health care provider if you have a change in the thrill or have any signs of infection at your fistula site.  Keep all follow-up visits. This is important. This information is not intended to replace advice given to you by your health care provider. Make sure you discuss any questions you have with your health care provider. Document Revised: 05/22/2020 Document Reviewed: 05/22/2020 Elsevier Patient Education  [place].

## 2021-02-17 NOTE — Progress Notes (Signed)
Discharge instructions reviewed with pt and her son both voice understanding.  

## 2021-02-17 NOTE — Interval H&P Note (Signed)
History and Physical Interval Note:  02/17/2021 1:27 PM  Anna Carr  has presented today for surgery, with the diagnosis of end stage renal.  The various methods of treatment have been discussed with the patient and family. After consideration of risks, benefits and other options for treatment, the patient has consented to  Procedure(s): A/V FISTULAGRAM (Left) as a surgical intervention.  The patient's history has been reviewed, patient examined, no change in status, stable for surgery.  I have reviewed the patient's chart and labs.  Questions were answered to the patient's satisfaction.     [name]

## 2021-02-17 NOTE — Progress Notes (Signed)
Ambulated in hallway and to the bathroom to void tol well.  

## 2021-02-17 NOTE — Op Note (Signed)
    Patient name: Anna Carr MRN: [id number] DOB: [birth year] Sex: female  02/17/2021 Pre-operative Diagnosis: esrd Post-operative diagnosis:  Same Surgeon:  Erlene Quan C. Donzetta Matters, MD Procedure Performed: 1.  Ultrasound-guided cannulation left arm fistula 2.  Left upper extremity fistulogram  Indications: 80 year old female with a history of left upper extremity fistula placed in [place] in 2020.  This is failed to work she is now on dialysis via catheter.  She is right-hand dominant she does not have access in the right upper extremity.  She is here today for fistulogram.  Findings: There is a cephalic vein that is patent throughout the upper arm.  Approximately that is diseased with approximately 50% stenosis just at the anastomosis and 50% stenosis 1 cm after that.  No intervention was undertaken.  Plan will be for conversion to interposition upper arm graft on a nondialysis day in the near future.   Procedure:  The patient was identified in the holding area and taken to room 8.  The patient was then placed supine on the table and prepped and draped in the usual sterile fashion.  A time out was called.  Ultrasound was used to evaluate the left arm AV fistula.  This was patent.  There was anesthetized 1% lidocaine cannulated micropuncture needle followed by wire sheath.  An image saved the permanent record.  Left upper extremity fistulogram was performed as well as retrograde imaging.  With the above findings we will plan for left arm interposition graft in the upper arm.  We remove the sheath and suture-ligated the cannulation site with 4 Monocryl.  She tolerated procedure well without immediate complication.   Contrast: 35 cc   Syrita Dovel C. Donzetta Matters, MD [facility] [place] Office: [phone] Pager: [phone]

## 2021-02-18 ENCOUNTER — Encounter (HOSPITAL_COMMUNITY): Payer: Self-pay | Admitting: Vascular Surgery

## 2021-02-18 MED FILL — Heparin Sod (Porcine)-NaCl IV Soln 1000 Unit/500ML-0.9%: INTRAVENOUS | Qty: 500 | Status: AC

## 2021-02-19 ENCOUNTER — Encounter (INDEPENDENT_AMBULATORY_CARE_PROVIDER_SITE_OTHER): Payer: Medicare Other | Admitting: Ophthalmology

## 2021-02-26 ENCOUNTER — Other Ambulatory Visit: Payer: Self-pay

## 2021-03-07 ENCOUNTER — Encounter (INDEPENDENT_AMBULATORY_CARE_PROVIDER_SITE_OTHER): Payer: Medicare Other | Admitting: Ophthalmology

## 2021-03-10 ENCOUNTER — Other Ambulatory Visit (HOSPITAL_COMMUNITY)
Admission: RE | Admit: 2021-03-10 | Discharge: 2021-03-10 | Disposition: A | Discharge status: Home / Self Care | Payer: Medicare Other | Source: Ambulatory Visit | Attending: Vascular Surgery | Admitting: Vascular Surgery

## 2021-03-10 DIAGNOSIS — Z01812 Encounter for preprocedural laboratory examination: Secondary | ICD-10-CM | POA: Insufficient documentation

## 2021-03-10 DIAGNOSIS — Z20822 Contact with and (suspected) exposure to covid-19: Secondary | ICD-10-CM | POA: Diagnosis not present

## 2021-03-11 ENCOUNTER — Encounter (HOSPITAL_COMMUNITY): Payer: Self-pay | Admitting: Vascular Surgery

## 2021-03-11 LAB — SARS CORONAVIRUS 2 (TAT 6-24 HRS): SARS Coronavirus 2: NEGATIVE

## 2021-03-11 NOTE — Progress Notes (Addendum)
I received a call around 11:00 from De Hollingshead, Ms Troyer's granddaughter. I asked if Ms Befort was with Ms Johney Maine. She was not with her, I informed Ms Gross that I need Ms Whitmer permission to speak with her.  Ms Gross said that she would be with Ms Plantz at 3:00pm and I would be able to speak with Ms Perigo.  I called at 1500 and 1515, no answer, I left a voice message asking Ms Johney Maine to return my call.  Ms Jammer called , she denies chest pain or shortness of breath. Patient was tested for Covid and has been in quarantine since that time.  Ms Chumney has type II diabetes, patient reports that CBG's have been in the 200- 300's.  I instructed patient to not take Glipizide this evening or in the am. I asked Ms Nygren if she has been instructed by her Endocrinologist, what to do when she is not eating or drinking. Ms Bruni will be leave her home by 0730 in am.  I discussed this with Dr. Carlota Raspberry, who said to not take Insulin in am, will be treated if high in am. I instructed patient to check CBG after awaking and every 2 hours until arrival  to the hospital.  I Instructed patient if CBG is less than 70 to drink 1/2 cup of a clear juice. Recheck CBG in 15 minutes if CBG is not over 70 call, pre- op desk at [phone] for further instructions. Endrocrinologist is with [facility]- ? Dr. Arlis Porta- needs clarifying.  Cardiologist is Dr. Britt Boozer , she is in [place], patient saw her last 09/2020.  PCP is Dr.Grisso.

## 2021-03-12 ENCOUNTER — Encounter (HOSPITAL_COMMUNITY): Payer: Self-pay | Admitting: Vascular Surgery

## 2021-03-12 ENCOUNTER — Encounter (HOSPITAL_COMMUNITY)
Admission: RE | Disposition: A | Discharge status: Home / Self Care | Payer: Self-pay | Source: Ambulatory Visit | Attending: Vascular Surgery

## 2021-03-12 ENCOUNTER — Other Ambulatory Visit: Payer: Self-pay

## 2021-03-12 ENCOUNTER — Ambulatory Visit (HOSPITAL_COMMUNITY): Payer: Medicare Other | Admitting: Certified Registered Nurse Anesthetist

## 2021-03-12 ENCOUNTER — Ambulatory Visit (HOSPITAL_COMMUNITY)
Admission: RE | Admit: 2021-03-12 | Discharge: 2021-03-12 | Disposition: A | Discharge status: Home / Self Care | Payer: Medicare Other | Source: Ambulatory Visit | Attending: Vascular Surgery | Admitting: Vascular Surgery

## 2021-03-12 DIAGNOSIS — E1122 Type 2 diabetes mellitus with diabetic chronic kidney disease: Secondary | ICD-10-CM | POA: Insufficient documentation

## 2021-03-12 DIAGNOSIS — Z79899 Other long term (current) drug therapy: Secondary | ICD-10-CM | POA: Insufficient documentation

## 2021-03-12 DIAGNOSIS — T82898A Other specified complication of vascular prosthetic devices, implants and grafts, initial encounter: Secondary | ICD-10-CM | POA: Insufficient documentation

## 2021-03-12 DIAGNOSIS — Z794 Long term (current) use of insulin: Secondary | ICD-10-CM | POA: Insufficient documentation

## 2021-03-12 DIAGNOSIS — I132 Hypertensive heart and chronic kidney disease with heart failure and with stage 5 chronic kidney disease, or end stage renal disease: Secondary | ICD-10-CM | POA: Insufficient documentation

## 2021-03-12 DIAGNOSIS — N186 End stage renal disease: Secondary | ICD-10-CM | POA: Insufficient documentation

## 2021-03-12 DIAGNOSIS — I5032 Chronic diastolic (congestive) heart failure: Secondary | ICD-10-CM | POA: Insufficient documentation

## 2021-03-12 DIAGNOSIS — Z992 Dependence on renal dialysis: Secondary | ICD-10-CM | POA: Insufficient documentation

## 2021-03-12 DIAGNOSIS — Z8249 Family history of ischemic heart disease and other diseases of the circulatory system: Secondary | ICD-10-CM | POA: Insufficient documentation

## 2021-03-12 DIAGNOSIS — X58XXXA Exposure to other specified factors, initial encounter: Secondary | ICD-10-CM | POA: Insufficient documentation

## 2021-03-12 DIAGNOSIS — Z833 Family history of diabetes mellitus: Secondary | ICD-10-CM | POA: Insufficient documentation

## 2021-03-12 DIAGNOSIS — Z87891 Personal history of nicotine dependence: Secondary | ICD-10-CM | POA: Insufficient documentation

## 2021-03-12 DIAGNOSIS — J449 Chronic obstructive pulmonary disease, unspecified: Secondary | ICD-10-CM | POA: Insufficient documentation

## 2021-03-12 DIAGNOSIS — N185 Chronic kidney disease, stage 5: Secondary | ICD-10-CM | POA: Diagnosis not present

## 2021-03-12 HISTORY — PX: AV FISTULA PLACEMENT: SHX1204

## 2021-03-12 HISTORY — DX: Pneumonia, unspecified organism: J18.9

## 2021-03-12 HISTORY — DX: Dyspnea, unspecified: R06.00

## 2021-03-12 HISTORY — DX: Personal history of other medical treatment: Z92.89

## 2021-03-12 LAB — CBC
HCT: 22.4 % — ABNORMAL LOW (ref 36.0–46.0)
Hemoglobin: 7.4 g/dL — ABNORMAL LOW (ref 12.0–15.0)
MCH: 31.6 pg (ref 26.0–34.0)
MCHC: 33 g/dL (ref 30.0–36.0)
MCV: 95.7 fL (ref 80.0–100.0)
Platelets: 193 10*3/uL (ref 150–400)
RBC: 2.34 MIL/uL — ABNORMAL LOW (ref 3.87–5.11)
RDW: 14.8 % (ref 11.5–15.5)
WBC: 9.3 10*3/uL (ref 4.0–10.5)
nRBC: 0.2 % (ref 0.0–0.2)

## 2021-03-12 LAB — GLUCOSE, CAPILLARY
Glucose-Capillary: 143 mg/dL — ABNORMAL HIGH (ref 70–99)
Glucose-Capillary: 152 mg/dL — ABNORMAL HIGH (ref 70–99)
Glucose-Capillary: 156 mg/dL — ABNORMAL HIGH (ref 70–99)
Glucose-Capillary: 186 mg/dL — ABNORMAL HIGH (ref 70–99)

## 2021-03-12 LAB — ABO/RH: ABO/RH(D): A POS

## 2021-03-12 LAB — POCT I-STAT, CHEM 8
BUN: 34 mg/dL — ABNORMAL HIGH (ref 8–23)
Calcium, Ion: 1.13 mmol/L — ABNORMAL LOW (ref 1.15–1.40)
Chloride: 96 mmol/L — ABNORMAL LOW (ref 98–111)
Creatinine, Ser: 3.5 mg/dL — ABNORMAL HIGH (ref 0.44–1.00)
Glucose, Bld: 172 mg/dL — ABNORMAL HIGH (ref 70–99)
HCT: 20 % — ABNORMAL LOW (ref 36.0–46.0)
Hemoglobin: 6.8 g/dL — CL (ref 12.0–15.0)
Potassium: 3.6 mmol/L (ref 3.5–5.1)
Sodium: 131 mmol/L — ABNORMAL LOW (ref 135–145)
TCO2: 26 mmol/L (ref 22–32)

## 2021-03-12 LAB — TYPE AND SCREEN
ABO/RH(D): A POS
Antibody Screen: NEGATIVE

## 2021-03-12 SURGERY — INSERTION OF ARTERIOVENOUS (AV) GORE-TEX GRAFT ARM
Anesthesia: Monitor Anesthesia Care | Site: Arm Upper | Laterality: Left

## 2021-03-12 MED ORDER — CEFAZOLIN SODIUM-DEXTROSE 2-4 GM/100ML-% IV SOLN
2.0000 g | INTRAVENOUS | Status: AC
  Administered 2021-03-12: 2 g via INTRAVENOUS
  Filled 2021-03-12: qty 100

## 2021-03-12 MED ORDER — ORAL CARE MOUTH RINSE: 15.0000 mL | Freq: Once | OROMUCOSAL | Status: AC

## 2021-03-12 MED ORDER — LIDOCAINE 2% (20 MG/ML) 5 ML SYRINGE
INTRAMUSCULAR | Status: DC | PRN
  Administered 2021-03-12: 40 mg via INTRAVENOUS

## 2021-03-12 MED ORDER — PROPOFOL 10 MG/ML IV BOLUS
INTRAVENOUS | Status: AC
  Filled 2021-03-12: qty 20

## 2021-03-12 MED ORDER — 0.9 % SODIUM CHLORIDE (POUR BTL) OPTIME
TOPICAL | Status: DC | PRN
  Administered 2021-03-12: 1000 mL

## 2021-03-12 MED ORDER — DEXAMETHASONE SODIUM PHOSPHATE 10 MG/ML IJ SOLN
INTRAMUSCULAR | Status: AC
  Filled 2021-03-12: qty 1

## 2021-03-12 MED ORDER — PHENYLEPHRINE HCL (PRESSORS) 10 MG/ML IV SOLN
INTRAVENOUS | Status: DC | PRN
  Administered 2021-03-12 (×2): 80 ug via INTRAVENOUS

## 2021-03-12 MED ORDER — SODIUM CHLORIDE 0.9 % IV SOLN
INTRAVENOUS | Status: AC
  Filled 2021-03-12: qty 1.2

## 2021-03-12 MED ORDER — LIDOCAINE-EPINEPHRINE (PF) 1.5 %-1:200000 IJ SOLN
INTRAMUSCULAR | Status: DC | PRN
  Administered 2021-03-12: 15 mL via PERINEURAL

## 2021-03-12 MED ORDER — LIDOCAINE-EPINEPHRINE 1 %-1:100000 IJ SOLN
INTRAMUSCULAR | Status: AC
  Filled 2021-03-12: qty 1

## 2021-03-12 MED ORDER — CHLORHEXIDINE GLUCONATE 4 % EX LIQD: 60.0000 mL | Freq: Once | CUTANEOUS | Status: DC

## 2021-03-12 MED ORDER — SODIUM CHLORIDE 0.9 % IV SOLN
INTRAVENOUS | Status: DC | PRN
  Administered 2021-03-12: 500 mL

## 2021-03-12 MED ORDER — ACETAMINOPHEN 500 MG PO TABS: 1000.0000 mg | ORAL_TABLET | Freq: Once | ORAL | Status: DC | PRN

## 2021-03-12 MED ORDER — SODIUM CHLORIDE 0.9 % IV SOLN: INTRAVENOUS | Status: DC

## 2021-03-12 MED ORDER — EPHEDRINE 5 MG/ML INJ
INTRAVENOUS | Status: AC
  Filled 2021-03-12: qty 10

## 2021-03-12 MED ORDER — HYDROCODONE-ACETAMINOPHEN 5-325 MG PO TABS: 1.0000 | ORAL_TABLET | Freq: Four times a day (QID) | ORAL | 0 refills | Status: DC | PRN

## 2021-03-12 MED ORDER — PROPOFOL 10 MG/ML IV BOLUS
INTRAVENOUS | Status: DC | PRN
  Administered 2021-03-12: 20 mg via INTRAVENOUS

## 2021-03-12 MED ORDER — FENTANYL CITRATE (PF) 100 MCG/2ML IJ SOLN
INTRAMUSCULAR | Status: AC
  Administered 2021-03-12: 50 ug via INTRAVENOUS
  Filled 2021-03-12: qty 2

## 2021-03-12 MED ORDER — PROPOFOL 500 MG/50ML IV EMUL
INTRAVENOUS | Status: DC | PRN
  Administered 2021-03-12: 40 ug/kg/min via INTRAVENOUS

## 2021-03-12 MED ORDER — ACETAMINOPHEN 10 MG/ML IV SOLN: 1000.0000 mg | Freq: Once | INTRAVENOUS | Status: DC | PRN

## 2021-03-12 MED ORDER — FENTANYL CITRATE (PF) 100 MCG/2ML IJ SOLN
50.0000 ug | Freq: Once | INTRAMUSCULAR | Status: AC
Start: 2021-03-12 — End: 2021-03-12

## 2021-03-12 MED ORDER — CHLORHEXIDINE GLUCONATE 0.12 % MT SOLN
15.0000 mL | Freq: Once | OROMUCOSAL | Status: AC
  Administered 2021-03-12: 15 mL via OROMUCOSAL
  Filled 2021-03-12: qty 15

## 2021-03-12 MED ORDER — PHENYLEPHRINE 40 MCG/ML (10ML) SYRINGE FOR IV PUSH (FOR BLOOD PRESSURE SUPPORT)
PREFILLED_SYRINGE | INTRAVENOUS | Status: AC
  Filled 2021-03-12: qty 20

## 2021-03-12 MED ORDER — LIDOCAINE 2% (20 MG/ML) 5 ML SYRINGE
INTRAMUSCULAR | Status: AC
  Filled 2021-03-12: qty 15

## 2021-03-12 MED ORDER — MIDAZOLAM HCL 2 MG/2ML IJ SOLN
INTRAMUSCULAR | Status: AC
  Filled 2021-03-12: qty 2

## 2021-03-12 MED ORDER — OXYCODONE HCL 5 MG/5ML PO SOLN: 5.0000 mg | Freq: Once | ORAL | Status: DC | PRN

## 2021-03-12 MED ORDER — ACETAMINOPHEN 160 MG/5ML PO SOLN: 1000.0000 mg | Freq: Once | ORAL | Status: DC | PRN

## 2021-03-12 MED ORDER — FENTANYL CITRATE (PF) 250 MCG/5ML IJ SOLN
INTRAMUSCULAR | Status: AC
  Filled 2021-03-12: qty 5

## 2021-03-12 MED ORDER — FENTANYL CITRATE (PF) 100 MCG/2ML IJ SOLN: 25.0000 ug | INTRAMUSCULAR | Status: DC | PRN

## 2021-03-12 MED ORDER — MEPIVACAINE HCL (PF) 1.5 % IJ SOLN
INTRAMUSCULAR | Status: DC | PRN
  Administered 2021-03-12: 10 mL via PERINEURAL

## 2021-03-12 MED ORDER — ONDANSETRON HCL 4 MG/2ML IJ SOLN
INTRAMUSCULAR | Status: AC
  Filled 2021-03-12: qty 2

## 2021-03-12 MED ORDER — OXYCODONE HCL 5 MG PO TABS: 5.0000 mg | ORAL_TABLET | Freq: Once | ORAL | Status: DC | PRN

## 2021-03-12 SURGICAL SUPPLY — 34 items
ARMBAND PINK RESTRICT EXTREMIT (MISCELLANEOUS) ×4 IMPLANT
CANISTER SUCT 3000ML PPV (MISCELLANEOUS) ×2 IMPLANT
CLIP LIGATING EXTRA MED SLVR (CLIP) ×2 IMPLANT
CLIP LIGATING EXTRA SM BLUE (MISCELLANEOUS) ×2 IMPLANT
COVER TRANSDUCER ULTRASND GEL (DISPOSABLE) ×2 IMPLANT
COVER WAND RF STERILE (DRAPES) IMPLANT
DERMABOND ADHESIVE PROPEN (GAUZE/BANDAGES/DRESSINGS) ×1
DERMABOND ADVANCED (GAUZE/BANDAGES/DRESSINGS) ×1
DERMABOND ADVANCED .7 DNX12 (GAUZE/BANDAGES/DRESSINGS) ×1 IMPLANT
DERMABOND ADVANCED .7 DNX6 (GAUZE/BANDAGES/DRESSINGS) ×1 IMPLANT
ELECT REM PT RETURN 9FT ADLT (ELECTROSURGICAL) ×2
ELECTRODE REM PT RTRN 9FT ADLT (ELECTROSURGICAL) ×1 IMPLANT
GLOVE BIO SURGEON STRL SZ7.5 (GLOVE) ×2 IMPLANT
GOWN STRL REUS W/ TWL LRG LVL3 (GOWN DISPOSABLE) ×2 IMPLANT
GOWN STRL REUS W/ TWL XL LVL3 (GOWN DISPOSABLE) ×1 IMPLANT
GOWN STRL REUS W/TWL LRG LVL3 (GOWN DISPOSABLE) ×2
GOWN STRL REUS W/TWL XL LVL3 (GOWN DISPOSABLE) ×1
GRAFT GORETEX STRT 4-7X45 (Vascular Products) ×2 IMPLANT
HEMOSTAT SNOW SURGICEL 2X4 (HEMOSTASIS) IMPLANT
INSERT FOGARTY SM (MISCELLANEOUS) IMPLANT
KIT BASIN OR (CUSTOM PROCEDURE TRAY) ×2 IMPLANT
KIT TURNOVER KIT B (KITS) ×2 IMPLANT
NS IRRIG 1000ML POUR BTL (IV SOLUTION) ×2 IMPLANT
PACK CV ACCESS (CUSTOM PROCEDURE TRAY) ×2 IMPLANT
PAD ARMBOARD 7.5X6 YLW CONV (MISCELLANEOUS) ×4 IMPLANT
SUT MNCRL AB 4-0 PS2 18 (SUTURE) ×4 IMPLANT
SUT PROLENE 6 0 BV (SUTURE) ×4 IMPLANT
SUT SILK 2 0 SH (SUTURE) IMPLANT
SUT VIC AB 3-0 SH 27 (SUTURE) ×2
SUT VIC AB 3-0 SH 27X BRD (SUTURE) ×2 IMPLANT
SYR TOOMEY 50ML (SYRINGE) IMPLANT
TOWEL GREEN STERILE (TOWEL DISPOSABLE) ×2 IMPLANT
UNDERPAD 30X36 HEAVY ABSORB (UNDERPADS AND DIAPERS) ×2 IMPLANT
WATER STERILE IRR 1000ML POUR (IV SOLUTION) ×2 IMPLANT

## 2021-03-12 NOTE — H&P (Signed)
HPI:  Anna Carr is a 80 y.o. female history of end-stage renal. Currently dialyzing via catheter. She has a history of a fistula placed on September 27, 2019. She states she has had 2 previous fistulogram was performed [facility] last year. Unfortunately the vein is not matured to suitable use. She is currently dialyzing via catheter. She does not take any blood thinners. She is right-hand dominant has never had any intervention of the right upper extremity.      Past Medical History:  Diagnosis Date  . Acute on chronic diastolic congestive heart failure ([place]) 03/11/2019  . Acute systolic heart failure ([place]) 03/12/2019  . Anemia   . Anemia, chronic disease 01/23/2017  . AV fistula ([place])   . Benign hypertension with CKD (chronic kidney disease) stage V ([place]) 09/15/2018  . CHF with left ventricular diastolic dysfunction, NYHA class 1 ([place]) 02/02/2017  . Chronic diastolic heart failure ([place]) 11/22/2019  . Chronic kidney disease, stage 5 ([place]) 07/18/2019  . Colon polyp   . COPD mixed type ([place])   . Depression   . Dyspnea on exertion   . High risk medication use 12/22/2015  . Hyperlipidemia   . Hypertension   . Hypervolemia 11/13/2019  . Hypokalemia 03/11/2019  . Hyponatremia 03/11/2019  . Hypoxia 03/11/2019  . Insomnia   . Insulin long-term use ([place]) 12/22/2015  . Kidney disease   . Major depressive disorder, recurrent ([place]) 12/22/2015  . Malaise and fatigue 12/23/2015  . Osteoarthritis   . Osteopenia   . 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 SIADH (syndrome of inappropriate ADH production) ([place]) 03/29/2019  . Type 2 diabetes mellitus ([place])   . Type 2 diabetes mellitus, with long-term current use of insulin ([place]) 03/11/2019        Family History  Problem Relation Age of Onset  . Diabetes Mother   . Hypertension Mother   . Diabetes Maternal Grandfather         Past Surgical History:  Procedure Laterality Date  . APPENDECTOMY    . AV FISTULA PLACEMENT Left   .  HYSTEROSCOPY     Short Social History:  Social History        Tobacco Use  . Smoking status: Former Smoker    Types: Cigarettes  . Smokeless tobacco: Never Used  Substance Use Topics  . Alcohol use: Not Currently   No Known Allergies        Current Outpatient Medications  Medication Sig Dispense Refill  . acetaminophen (TYLENOL) 500 MG tablet Take 500 mg by mouth as needed.     Marland Kitchen amLODipine (NORVASC) 10 MG tablet TAKE 1 TABLET BY MOUTH DAILY    . ascorbic acid (VITAMIN C) 1000 MG tablet Take 1,000 mg by mouth daily.    Marland Kitchen aspirin 81 MG EC tablet Take 81 mg by mouth daily.     . B Complex Vitamins (VITAMIN B-COMPLEX) TABS Take by mouth daily.    . Biotin 5 MG TBDP Take 5,000 mcg by mouth every other day.    . bisacodyl (DULCOLAX) 5 MG EC tablet Take 5 mg by mouth daily as needed.     . Calcium Polycarbophil (FIBER) 625 MG TABS Take 625 mg by mouth daily.    . carboxymethylcellulose (REFRESH PLUS) 0.5 % SOLN Place 1 drop into both eyes daily as needed.    . carvedilol (COREG) 6.25 MG tablet Take 6.25 mg by mouth 2 (two) times daily.    . Cholecalciferol 1.25 MG (50000  UT) TABS Take by mouth.    . cyanocobalamin 50 MCG tablet Take 50 mcg by mouth daily.     Mariane Baumgarten Sodium 100 MG capsule Take 100 mg by mouth as needed.    . fluticasone (FLONASE) 50 MCG/ACT nasal spray Place 1 spray into both nostrils daily.    . furosemide (LASIX) 80 MG tablet Take 80 mg by mouth daily.    Marland Kitchen glimepiride (AMARYL) 4 MG tablet Take 4 mg by mouth 2 (two) times daily.    . Insulin Glargine-Lixisenatide (SOLIQUA) 100-33 UNT-MCG/ML SOPN Inject 12 Units into the skin in the morning.    . iron sucrose in sodium chloride 0.9 % 100 mL once a week.    . Lactobacillus (PROBIOTIC ACIDOPHILUS PO) Take by mouth.    . lidocaine-prilocaine (EMLA) cream Apply 1 application topically as needed.    . Omega-3 Fatty Acids (FISH OIL) 1000 MG CAPS 2 times daily.    . polyethylene glycol (MIRALAX / GLYCOLAX) 17 g packet  Take 17 g by mouth daily.    . potassium chloride (KLOR-CON) 10 MEQ tablet Take 10 mEq by mouth daily.    . vitamin E 1000 UNIT capsule Take 1,000 Units by mouth daily.     No current facility-administered medications for this visit.   Review of Systems  Constitutional: Constitutional negative.  HENT: HENT negative.  Eyes: Eyes negative.  Respiratory: Respiratory negative.  Cardiovascular: Cardiovascular negative.  GI: Gastrointestinal negative.  Musculoskeletal: Musculoskeletal negative.  Skin: Skin negative.  Neurological: Neurological negative.  Hematologic: Hematologic/lymphatic negative.  Psychiatric: Psychiatric negative.    Vitals:   03/12/21 0848  BP: (!) 170/44  Pulse: 67  Resp: 17  Temp: 97.9 F (36.6 C)  SpO2: 97%    Physical Exam  HENT:  Head: Normocephalic.  Nose:  Comments: Wearing a mask Eyes:  Pupils: Pupils are equal, round, and reactive to light.  Cardiovascular:  Rate and Rhythm: Normal rate.  Pulmonary:  Effort: Pulmonary effort is normal.  Abdominal:  General: Abdomen is flat.  Musculoskeletal:  General: Normal range of motion.  Comments: Thrill in left arm  Skin:  Capillary Refill: Capillary refill takes less than 2 seconds.  Neurological:  General: No focal deficit present.  Mental Status: She is alert.  Psychiatric:  Mood and Affect: Mood normal.  Behavior: Behavior normal.  Thought Content: Thought content normal.  Judgment: Judgment normal.   Data:  Right Cephalic Diameter (cm)Depth (cm)Findings  +-----------------+-------------+----------+--------+  Shoulder  0.39     +-----------------+-------------+----------+--------+  Prox upper arm  0.40     +-----------------+-------------+----------+--------+  Mid upper arm  0.38     +-----------------+-------------+----------+--------+  Dist upper arm  0.35     +-----------------+-------------+----------+--------+  Antecubital fossa 0.43      +-----------------+-------------+----------+--------+  Prox forearm  0.29     +-----------------+-------------+----------+--------+  Mid forearm  0.27     +-----------------+-------------+----------+--------+  Dist forearm  0.18     +-----------------+-------------+----------+--------+   +-----------------+-------------+----------+--------+  Right Basilic Diameter (cm)Depth (cm)Findings  +-----------------+-------------+----------+--------+  Prox upper arm  0.41     +-----------------+-------------+----------+--------+  Mid upper arm  0.47     +-----------------+-------------+----------+--------+  Dist upper arm  0.31     +-----------------+-------------+----------+--------+  Antecubital fossa 0.39     +-----------------+-------------+----------+--------+   +-----------------+-------------+----------+--------+  Left Basilic Diameter (cm)Depth (cm)Findings  +-----------------+-------------+----------+--------+  Mid upper arm  0.54     +-----------------+-------------+----------+--------+  Dist upper arm  0.38     +-----------------+-------------+----------+--------+  Antecubital fossa 0.44     +-----------------+-------------+----------+--------+  Summary: Right: Patent cephalic and basilic veins.  Left: Patent basilic vein.  BCAVF is patent with the smallest diameter of 0.65 cm.  Tortuous at the anastomosis with a narrowing of the vein in  this area. Velocity of 625 cm/s. Anastomosis velocity of 561  cm/s.  Right Pre-Dialysis Findings:  +-----------------------+----------+--------------------+---------+--------  +  Location PSV (cm/s)Intralum. Diam. (cm)Waveform  Comments  +-----------------------+----------+--------------------+---------+--------  +  Brachial Antecub. fossa115 0.49 triphasic    +-----------------------+----------+--------------------+---------+--------  +  Radial Art at Wrist  80 0.26 biphasic     +-----------------------+----------+--------------------+---------+--------  +  Ulnar Art at Wrist 85 0.21 biphasic     +-----------------------+----------+--------------------+---------+--------  +      Left Pre-Dialysis Findings:  +------------------+----------+------------------+--------+----------------  ----+  Location PSV (cm/s)Intralum. Diam. WaveformComments      (cm)      +------------------+----------+------------------+--------+----------------  ----+  Brachial Antecub. 207 0.55  Waveform  consistent   fossa    with AVF.    +------------------+----------+------------------+--------+----------------  ----+  Radial Art at 63 0.20 biphasic    Wrist        +------------------+----------+------------------+--------+----------------  ----+  Ulnar Art at Wrist27 0.16 biphasic    +------------------+----------+------------------+--------+----------------  ----+   Assessment/Plan:   80 year old female with end-stage renal disease dialyzing via catheter. She has a fistula in the left upper extremity does have a strong thrill may be too small for access. Plan for conversion to left upper arm av graft.  We discussed the risk benefits alternatives she demonstrates good understanding we will get her scheduled on a nondialysis day in the near future.    Waynetta Sandy MD  [facility]

## 2021-03-12 NOTE — Anesthesia Preprocedure Evaluation (Addendum)
Anesthesia Evaluation  Patient identified by MRN, date of birth, ID band Patient awake    Reviewed: Allergy & Precautions, NPO status , Patient's Chart, lab work & pertinent test results, reviewed documented beta blocker date and time   History of Anesthesia Complications (+) history of anesthetic complications  Airway Mallampati: II  TM Distance: >3 FB Neck ROM: Full    Dental  (+) Dental Advisory Given   Pulmonary shortness of breath, COPD, former smoker,    breath sounds clear to auscultation       Cardiovascular hypertension, Pt. on medications and Pt. on home beta blockers +CHF   Rhythm:Regular   The left ventricular ejection fraction is normal (55-65%).  Nuclear stress EF: 56%.  There was no ST segment deviation noted during stress.  Defect 1: There is a small defect of mild severity present in the basal anterior location.  This is a low risk study.  No evidence of ischemia or MI.  Normal EF.  Breast attenuation     Neuro/Psych PSYCHIATRIC DISORDERS Depression negative neurological ROS     GI/Hepatic negative GI ROS, Neg liver ROS,   Endo/Other  diabetes, Insulin DependentNo results found for: HGBA1C  SIADH (syndrome of inappropriate ADH production) ([place])  Renal/GU ESRF and DialysisRenal diseaseLab Results      Component                Value               Date                      CREATININE               3.50 (H)            03/12/2021           Lab Results      Component                Value               Date                      K                        3.6                 03/12/2021           Hd 5/17     Musculoskeletal   Abdominal   Peds  Hematology  (+) Blood dyscrasia, anemia , Lab Results      Component                Value               Date                      WBC                      9.3                 03/12/2021                HGB                      7.4 (L)  03/12/2021                HCT                      22.4 (L)            03/12/2021                MCV                      95.7                03/12/2021                PLT                      193                 03/12/2021              Anesthesia Other Findings   Reproductive/Obstetrics                            Anesthesia Physical Anesthesia Plan  ASA: III  Anesthesia Plan: MAC and Regional   Post-op Pain Management:    Induction: Intravenous  PONV Risk Score and Plan: 2 and Propofol infusion and Treatment may vary due to age or medical condition  Airway Management Planned: Nasal Cannula  Additional Equipment: None  Intra-op Plan:   Post-operative Plan:   Informed Consent: I have reviewed the patients History and Physical, chart, labs and discussed the procedure including the risks, benefits and alternatives for the proposed anesthesia with the patient or authorized representative who has indicated his/her understanding and acceptance.     Dental advisory given  Plan Discussed with: CRNA and Surgeon  Anesthesia Plan Comments:         Anesthesia Quick Evaluation

## 2021-03-12 NOTE — Progress Notes (Signed)
Orthopedic Tech Progress Note Patient Details:  Anna Carr 1940-12-16 [id number] PACU RN called requested an ARM SLING  Ortho Devices Type of Ortho Device: Arm sling Ortho Device/Splint Location: LUE Ortho Device/Splint Interventions: Application,Adjustment   Post Interventions Patient Tolerated: Well Instructions Provided: [facility] [place] 03/12/2021, 2:13 PM

## 2021-03-12 NOTE — Anesthesia Procedure Notes (Signed)
Anesthesia Regional Block: Supraclavicular block   Pre-Anesthetic Checklist: ,, timeout performed, Correct Patient, Correct Site, Correct Laterality, Correct Procedure, Correct Position, site marked, Risks and benefits discussed,  Surgical consent,  Pre-op evaluation,  At surgeon's request and post-op pain management  Laterality: Left and Upper  Prep: chloraprep       Needles:  Injection technique: Single-shot     Needle Length: 5cm  Needle Gauge: 22     Additional Needles: Arrow StimuQuik ECHO Echogenic Stimulating PNB Needle  Procedures:,,,, ultrasound used (permanent image in chart),,,,  Narrative:  Start time: 03/12/2021 12:18 PM End time: 03/12/2021 12:22 PM Injection made incrementally with aspirations every 5 mL.  Performed by: Personally  Anesthesiologist: Oleta Mouse, MD

## 2021-03-12 NOTE — Transfer of Care (Signed)
Immediate Anesthesia Transfer of Care Note  Patient: Anna Carr  Procedure(s) Performed: INSERTION OF LEFT UPPER ARM ARTERIOVENOUS (AV) GORE-TEX GRAFT (Left Arm Upper)  Patient Location: PACU  Anesthesia Type:MAC and Regional  Level of Consciousness: awake, alert , oriented, patient cooperative and responds to stimulation  Airway & Oxygen Therapy: Patient Spontanous Breathing and Patient connected to nasal cannula oxygen  Post-op Assessment: Report given to RN and Post -op Vital signs reviewed and stable  Post vital signs: Reviewed and stable  Last Vitals:  Vitals Value Taken Time  BP    Temp    Pulse    Resp    SpO2      Last Pain:  Vitals:   03/12/21 0912  TempSrc:   PainSc: 0-No pain         Complications: No complications documented.

## 2021-03-12 NOTE — Discharge Instructions (Signed)
° °  [facility] ° °Discharge Instructions ° °AV Fistula or Graft Surgery for Dialysis Access ° °Please refer to the following instructions for your post-procedure care. Your surgeon or physician assistant will discuss any changes with you. ° °Activity ° °You may drive the day following your surgery, if you are comfortable and no longer taking prescription pain medication. Resume full activity as the soreness in your incision resolves. ° °Bathing/Showering ° °You may shower after you go home. Keep your incision dry for 48 hours. Do not soak in a bathtub, hot tub, or swim until the incision heals completely. You may not shower if you have a hemodialysis catheter. ° °Incision Care ° °Clean your incision with mild soap and water after 48 hours. Pat the area dry with a clean towel. You do not need a bandage unless otherwise instructed. Do not apply any ointments or creams to your incision. You may have skin glue on your incision. Do not peel it off. It will come off on its own in about one week. Your arm may swell a bit after surgery. To reduce swelling use pillows to elevate your arm so it is above your heart. Your doctor will tell you if you need to lightly wrap your arm with an ACE bandage. ° °Diet ° °Resume your normal diet. There are not special food restrictions following this procedure. In order to heal from your surgery, it is CRITICAL to get adequate nutrition. Your body requires vitamins, minerals, and protein. Vegetables are the best source of vitamins and minerals. Vegetables also provide the perfect balance of protein. Processed food has little nutritional value, so try to avoid this. ° °Medications ° °Resume taking all of your medications. If your incision is causing pain, you may take over-the counter pain relievers such as acetaminophen (Tylenol). If you were prescribed a stronger pain medication, please be aware these medications can cause nausea and constipation. Prevent  nausea by taking the medication with a snack or meal. Avoid constipation by drinking plenty of fluids and eating foods with high amount of fiber, such as fruits, vegetables, and grains. Do not take Tylenol if you are taking prescription pain medications. ° ° ° ° °Follow up °Your surgeon may want to see you in the office following your access surgery. If so, this will be arranged at the time of your surgery. ° °Please call us immediately for any of the following conditions: ° °Increased pain, redness, drainage (pus) from your incision site °Fever of 101 degrees or higher °Severe or worsening pain at your incision site °Hand pain or numbness. ° °Reduce your risk of vascular disease: ° °Stop smoking. If you would like help, call [facility] at 1-800-QUIT-NOW (1-800-784-8669) or [place] at [phone] ° °Manage your cholesterol °Maintain a desired weight °Control your diabetes °Keep your blood pressure down ° °Dialysis ° °It will take several weeks to several months for your new dialysis access to be ready for use. Your surgeon will determine when it is OK to use it. Your nephrologist will continue to direct your dialysis. You can continue to use your Permcath until your new access is ready for use. ° °If you have any questions, please call the office at [phone]. ° °

## 2021-03-12 NOTE — Progress Notes (Signed)
Patient's hemoglobin resulted 6.8 on iSTAT. Dr. Ermalene Postin made aware and orders placed for CBC and type and screen per Dr. Ermalene Postin. Hemoglobin is 7.4 on CBC result. Dr. Ermalene Postin made aware. No further orders received at this time.

## 2021-03-12 NOTE — Op Note (Signed)
    Patient name: Anna Carr MRN: [id number] DOB: 1941/03/25 Sex: female  03/12/2021 Pre-operative Diagnosis: esrd,  Post-operative diagnosis:  Same Surgeon:  Eda Paschal. Donzetta Matters, MD Assistant: Arlee Muslim, PA Procedure Performed:  Revision of left arm AV fistula with placement of interposition 4-7 mm AV graft  Indications: 80 year old female with end-stage renal disease.  She currently dialyzes via catheter.  She has a two-stage cephalic vein fistula in the left upper extremity that is failed to work.  By recent fistulogram appears that it has failed to mature does have stenosis just after the anastomosis.  She is now indicated for revision with graft placement.  An assistant was necessary to expedite the case.  Findings: Just after the anastomosis the fistula was large and then very tight.  Distally the fistula had failed to mature but the vein was probably 5 mm external diameter.  We placed the graft in between these 2 areas just at the level of the previous fistula.  There was a strong thrill at completion.  There is a palpable radial artery pulse with compression of the graft.   Procedure:  The patient was identified in the holding area and taken to the operating room where she is placed supine operative.  She was sterilely prepped draped in the left upper extremity usual fashion, antibiotics were minister timeout was called.  We began using ultrasound to identify the fistula near the anastomosis.  We elected to open the previous incision.  We also identified the fistula towards the axilla.  A longitudinal incision was made there.  We dissected out the fistula back to the arterial anastomosis marked if orientation.  Through a separate incision near the axilla we opened just above the old incision identify the fistula marked this orientation. A tunneler was passed between the 2 areas.  We clamped the fistula near the axilla proximally distally transected it.  We spatulated.  We trimmed the  graft to size and sewed end-to-end with 6-0 Prolene suture.  We then flushed heparinized saline through the graft and reclamped the vein.  The vein distally from there was tied off.  We turned our attention towards the anastomosis.  We clamped the fistula just at the arterial anastomosis.  We transected it.  We flushed the all vein with heparinized saline squeezed all the saline and blood out of it and then tied it off.  We spatulated the vein we removed what appeared to be a valve there.  We took the 4 mm end of the graft and spatulated this and again sewn end to end with 6-0 Prolene suture.  Prior completion without flushing all directions.  Upon completion there was very strong thrill in the graft.  There is a palpable radial artery pulse with compression of the graft.  We thoroughly irrigated the wounds and obtain pneumostasis.  We closed in layers of Vicryl and Monocryl.  Dermabond was placed to level the skin.  She was awakened from anesthesia having tolerated procedure well any complication.  Counts were correct at completion.  EBL: 25cc  Anna Satterwhite C. Donzetta Matters, MD [facility] Office: [phone] Pager: [phone]

## 2021-03-13 ENCOUNTER — Encounter (HOSPITAL_COMMUNITY): Payer: Self-pay | Admitting: Vascular Surgery

## 2021-03-13 NOTE — Anesthesia Postprocedure Evaluation (Signed)
Anesthesia Post Note  Patient: Anna Carr  Procedure(s) Performed: INSERTION OF LEFT UPPER ARM ARTERIOVENOUS (AV) GORE-TEX GRAFT (Left Arm Upper)     Patient location during evaluation: PACU Anesthesia Type: MAC and Regional Level of consciousness: awake and alert Pain management: pain level controlled Vital Signs Assessment: post-procedure vital signs reviewed and stable Respiratory status: spontaneous breathing, nonlabored ventilation, respiratory function stable and patient connected to nasal cannula oxygen Cardiovascular status: stable and blood pressure returned to baseline Postop Assessment: no apparent nausea or vomiting Anesthetic complications: no   No complications documented.  Last Vitals:  Vitals:   03/12/21 1407 03/12/21 1422  BP: (!) 140/48 (!) 141/45  Pulse: 65 63  Resp: 14 12  Temp: (!) 36.1 C   SpO2: 100% 99%    Last Pain:  Vitals:   03/12/21 1407  TempSrc:   PainSc: 0-No pain                 Nazar Kuan

## 2021-03-14 ENCOUNTER — Telehealth: Payer: Self-pay | Admitting: Cardiology

## 2021-03-14 ENCOUNTER — Telehealth: Payer: Self-pay

## 2021-03-14 NOTE — Telephone Encounter (Signed)
Pt's daughter is calling in to let the doctor or nurse know that she is taking her mother to the ER.She states that the pt is very weak and they are going to going to call the EMS

## 2021-03-14 NOTE — Telephone Encounter (Signed)
Called and spoke to patient daughter per dpr. She reports the patient had surgery for her fisutla on Wednesday. Since last night she has been having chest pain that feels like pins and needles, she has shortness of breath, and dizziness. No swelling to lower extremities. She is a little congested as well. She was just tested for covid so they do no think that is what is wrong. Patient is just very weak patient daughter wants to take her to the emergency room I advised this is a good idea and she needs to be evaluated in the emergency department. She plans to take her no further questions.

## 2021-03-14 NOTE — Telephone Encounter (Signed)
Pt's son Jenny Reichmann called with c/o pt having "chest tightness, swelling of both hands and a cough". John stated it seems as if she is getting a "cold". This has started in the last day or so.  MD has been made aware. Pt has been advised to report to ED if she is having chest pain and to call her cardiologist.

## 2021-03-15 ENCOUNTER — Encounter (HOSPITAL_COMMUNITY): Payer: Self-pay | Admitting: Family Medicine

## 2021-03-15 ENCOUNTER — Other Ambulatory Visit: Payer: Self-pay

## 2021-03-15 ENCOUNTER — Inpatient Hospital Stay (HOSPITAL_COMMUNITY)
Admission: AD | Admit: 2021-03-15 | Discharge: 2021-03-23 | DRG: 252 | Disposition: A | Discharge status: Home / Self Care | Payer: Medicare Other | Source: Other Acute Inpatient Hospital | Attending: Family Medicine | Admitting: Family Medicine

## 2021-03-15 DIAGNOSIS — Y832 Surgical operation with anastomosis, bypass or graft as the cause of abnormal reaction of the patient, or of later complication, without mention of misadventure at the time of the procedure: Secondary | ICD-10-CM | POA: Diagnosis present

## 2021-03-15 DIAGNOSIS — I5030 Unspecified diastolic (congestive) heart failure: Secondary | ICD-10-CM

## 2021-03-15 DIAGNOSIS — M8580 Other specified disorders of bone density and structure, unspecified site: Secondary | ICD-10-CM | POA: Diagnosis present

## 2021-03-15 DIAGNOSIS — I770 Arteriovenous fistula, acquired: Secondary | ICD-10-CM | POA: Diagnosis not present

## 2021-03-15 DIAGNOSIS — N2581 Secondary hyperparathyroidism of renal origin: Secondary | ICD-10-CM | POA: Diagnosis present

## 2021-03-15 DIAGNOSIS — Z20822 Contact with and (suspected) exposure to covid-19: Secondary | ICD-10-CM | POA: Diagnosis present

## 2021-03-15 DIAGNOSIS — Z79899 Other long term (current) drug therapy: Secondary | ICD-10-CM | POA: Diagnosis not present

## 2021-03-15 DIAGNOSIS — Z794 Long term (current) use of insulin: Secondary | ICD-10-CM | POA: Diagnosis not present

## 2021-03-15 DIAGNOSIS — E876 Hypokalemia: Secondary | ICD-10-CM | POA: Diagnosis not present

## 2021-03-15 DIAGNOSIS — N186 End stage renal disease: Secondary | ICD-10-CM | POA: Diagnosis present

## 2021-03-15 DIAGNOSIS — R0902 Hypoxemia: Secondary | ICD-10-CM | POA: Diagnosis not present

## 2021-03-15 DIAGNOSIS — T82858A Stenosis of vascular prosthetic devices, implants and grafts, initial encounter: Secondary | ICD-10-CM | POA: Diagnosis present

## 2021-03-15 DIAGNOSIS — Z992 Dependence on renal dialysis: Secondary | ICD-10-CM

## 2021-03-15 DIAGNOSIS — J449 Chronic obstructive pulmonary disease, unspecified: Secondary | ICD-10-CM | POA: Diagnosis present

## 2021-03-15 DIAGNOSIS — K5521 Angiodysplasia of colon with hemorrhage: Secondary | ICD-10-CM | POA: Diagnosis present

## 2021-03-15 DIAGNOSIS — Z7982 Long term (current) use of aspirin: Secondary | ICD-10-CM

## 2021-03-15 DIAGNOSIS — E1169 Type 2 diabetes mellitus with other specified complication: Secondary | ICD-10-CM

## 2021-03-15 DIAGNOSIS — R778 Other specified abnormalities of plasma proteins: Secondary | ICD-10-CM

## 2021-03-15 DIAGNOSIS — J189 Pneumonia, unspecified organism: Secondary | ICD-10-CM

## 2021-03-15 DIAGNOSIS — Z8249 Family history of ischemic heart disease and other diseases of the circulatory system: Secondary | ICD-10-CM | POA: Diagnosis not present

## 2021-03-15 DIAGNOSIS — I132 Hypertensive heart and chronic kidney disease with heart failure and with stage 5 chronic kidney disease, or end stage renal disease: Secondary | ICD-10-CM | POA: Diagnosis present

## 2021-03-15 DIAGNOSIS — D649 Anemia, unspecified: Secondary | ICD-10-CM

## 2021-03-15 DIAGNOSIS — Z87891 Personal history of nicotine dependence: Secondary | ICD-10-CM

## 2021-03-15 DIAGNOSIS — R42 Dizziness and giddiness: Secondary | ICD-10-CM | POA: Diagnosis not present

## 2021-03-15 DIAGNOSIS — E785 Hyperlipidemia, unspecified: Secondary | ICD-10-CM | POA: Diagnosis present

## 2021-03-15 DIAGNOSIS — K921 Melena: Secondary | ICD-10-CM | POA: Diagnosis not present

## 2021-03-15 DIAGNOSIS — Y828 Other medical devices associated with adverse incidents: Secondary | ICD-10-CM | POA: Diagnosis present

## 2021-03-15 DIAGNOSIS — N185 Chronic kidney disease, stage 5: Secondary | ICD-10-CM | POA: Diagnosis not present

## 2021-03-15 DIAGNOSIS — D62 Acute posthemorrhagic anemia: Secondary | ICD-10-CM | POA: Diagnosis present

## 2021-03-15 DIAGNOSIS — L03114 Cellulitis of left upper limb: Secondary | ICD-10-CM | POA: Diagnosis not present

## 2021-03-15 DIAGNOSIS — E1122 Type 2 diabetes mellitus with diabetic chronic kidney disease: Secondary | ICD-10-CM | POA: Diagnosis present

## 2021-03-15 DIAGNOSIS — E222 Syndrome of inappropriate secretion of antidiuretic hormone: Secondary | ICD-10-CM | POA: Diagnosis present

## 2021-03-15 DIAGNOSIS — Z833 Family history of diabetes mellitus: Secondary | ICD-10-CM

## 2021-03-15 DIAGNOSIS — M79602 Pain in left arm: Secondary | ICD-10-CM | POA: Diagnosis not present

## 2021-03-15 DIAGNOSIS — T827XXA Infection and inflammatory reaction due to other cardiac and vascular devices, implants and grafts, initial encounter: Secondary | ICD-10-CM | POA: Diagnosis present

## 2021-03-15 DIAGNOSIS — Z7984 Long term (current) use of oral hypoglycemic drugs: Secondary | ICD-10-CM

## 2021-03-15 DIAGNOSIS — I5042 Chronic combined systolic (congestive) and diastolic (congestive) heart failure: Secondary | ICD-10-CM | POA: Diagnosis present

## 2021-03-15 DIAGNOSIS — I248 Other forms of acute ischemic heart disease: Secondary | ICD-10-CM | POA: Diagnosis present

## 2021-03-15 DIAGNOSIS — M898X9 Other specified disorders of bone, unspecified site: Secondary | ICD-10-CM | POA: Diagnosis present

## 2021-03-15 DIAGNOSIS — E1165 Type 2 diabetes mellitus with hyperglycemia: Secondary | ICD-10-CM | POA: Diagnosis not present

## 2021-03-15 DIAGNOSIS — R609 Edema, unspecified: Secondary | ICD-10-CM | POA: Diagnosis not present

## 2021-03-15 DIAGNOSIS — D500 Iron deficiency anemia secondary to blood loss (chronic): Secondary | ICD-10-CM | POA: Diagnosis not present

## 2021-03-15 DIAGNOSIS — D631 Anemia in chronic kidney disease: Secondary | ICD-10-CM | POA: Diagnosis present

## 2021-03-15 DIAGNOSIS — L0390 Cellulitis, unspecified: Secondary | ICD-10-CM | POA: Diagnosis present

## 2021-03-15 DIAGNOSIS — I5032 Chronic diastolic (congestive) heart failure: Secondary | ICD-10-CM

## 2021-03-15 DIAGNOSIS — K635 Polyp of colon: Secondary | ICD-10-CM | POA: Diagnosis present

## 2021-03-15 DIAGNOSIS — R7989 Other specified abnormal findings of blood chemistry: Secondary | ICD-10-CM

## 2021-03-15 DIAGNOSIS — K5730 Diverticulosis of large intestine without perforation or abscess without bleeding: Secondary | ICD-10-CM | POA: Diagnosis present

## 2021-03-15 DIAGNOSIS — K5520 Angiodysplasia of colon without hemorrhage: Secondary | ICD-10-CM | POA: Diagnosis not present

## 2021-03-15 DIAGNOSIS — M1991 Primary osteoarthritis, unspecified site: Secondary | ICD-10-CM | POA: Diagnosis present

## 2021-03-15 DIAGNOSIS — I10 Essential (primary) hypertension: Secondary | ICD-10-CM

## 2021-03-15 DIAGNOSIS — I120 Hypertensive chronic kidney disease with stage 5 chronic kidney disease or end stage renal disease: Secondary | ICD-10-CM | POA: Diagnosis not present

## 2021-03-15 DIAGNOSIS — K31811 Angiodysplasia of stomach and duodenum with bleeding: Secondary | ICD-10-CM | POA: Diagnosis not present

## 2021-03-15 DIAGNOSIS — K922 Gastrointestinal hemorrhage, unspecified: Secondary | ICD-10-CM | POA: Diagnosis not present

## 2021-03-15 DIAGNOSIS — R195 Other fecal abnormalities: Secondary | ICD-10-CM

## 2021-03-15 DIAGNOSIS — K5900 Constipation, unspecified: Secondary | ICD-10-CM | POA: Diagnosis not present

## 2021-03-15 LAB — GLUCOSE, CAPILLARY: Glucose-Capillary: 208 mg/dL — ABNORMAL HIGH (ref 70–99)

## 2021-03-15 MED ORDER — INSULIN ASPART 100 UNIT/ML IJ SOLN
0.0000 [IU] | Freq: Three times a day (TID) | INTRAMUSCULAR | Status: DC
Start: 2021-03-16 — End: 2021-03-15

## 2021-03-15 MED ORDER — SODIUM CHLORIDE 0.9 % IV SOLN
1.0000 g | Freq: Every day | INTRAVENOUS | Status: DC
  Administered 2021-03-16 – 2021-03-18 (×4): 1 g via INTRAVENOUS
  Filled 2021-03-15 (×4): qty 10

## 2021-03-15 MED ORDER — INSULIN ASPART 100 UNIT/ML IJ SOLN
0.0000 [IU] | Freq: Three times a day (TID) | INTRAMUSCULAR | Status: DC
  Administered 2021-03-15: 5 [IU] via SUBCUTANEOUS
  Administered 2021-03-16: 2 [IU] via SUBCUTANEOUS
  Administered 2021-03-16: 8 [IU] via SUBCUTANEOUS
  Administered 2021-03-16: 5 [IU] via SUBCUTANEOUS

## 2021-03-15 NOTE — Progress Notes (Signed)
Patient admitted from [facility] ED. Patient transported via mobile. Denies pain. Alert and oriented x 4. Dr. Flossie Buffy notified via [facility] communication of patient's arrival.

## 2021-03-15 NOTE — Progress Notes (Signed)
MRSA and Covid swabs completed. Patient tolerated without difficulty.

## 2021-03-15 NOTE — H&P (Signed)
History and Physical    Anna Carr [id number] DOB: June 23, 1941 DOA: 03/15/2021  PCP: Anna Carr., MD  Patient coming from: [facility]   I have personally briefly reviewed patient's old medical records in [place]  Chief Complaint: chest pain, SOB, weakness  HPI: Anna Carr is a 80 y.o. female with medical history significant for ESRD on HD (Anna Carr/Thurs/Sat), chronic diastolic heart failure, hypertension, COPD,  history of GI bleed requiring transfusion, insulin-dependent type 2 diabetes who presents from [facility] for dialysis and GI consult.  Patient presented to [facility] yesterday on 5/20 with concerns of increasing shortness of breath, chest tightness weakness and dizziness.  She reports that on Sunday which was about a week ago she began to have diffuse stomach pain and felt she has gas.  Then on Tuesday she went to dialysis and started to note some stabbing chest pain.  Throughout the week she continued to feel some shortness of breath worse with exertion and weakness.  On 5/18 she had revision of her left arm AV fistula.  Then on Thursday she had another hemodialysis session but continues to feel weak.  Then following day she woke up with chest tightness, weakness, dizziness and felt like she had "gas buildup" in her stomach and decided to present to the ED.  She was noted to have significant anemia with hemoglobin of 6.8. FOBT positive. She reports anemia requiring 2 units of blood transfusion in 2021 and had normal endoscopy and a colonoscopy with 2 benign polyps removed. She was advised to do repeat colonoscopy in 6 months but has been unable to follow up since she did not want to travel to [place] from [place]. Has dark stool since she takes iron supplementation. No BRBPR.  Also noted to have troponin of 0.36 --> 0.68 -->0.71. She was evaluated by her primary cardiologist at [facility] who did not recommend starting heparin but continue  with aspirin. Thinks her elevated troponin is multifactorial in the setting of her ESRD as well as symptomatic anemia.  Patient endorsed resolution of her chest pain this morning following her blood transfusion overnight.  She also has been having increased pain and redness to her AV graft since revision on Wednesday.  Review of Systems:  Constitutional: No Weight Change, No Fever ENT/Mouth: No sore throat, No Rhinorrhea Eyes: No Eye Pain, No Vision Changes Cardiovascular: No Chest Pain, +SOB, No PND, No Dyspnea on Exertion, No Orthopnea, No Claudication, No Edema, No Palpitations Respiratory: No Cough, No Sputum, No Wheezing, + Dyspnea  Gastrointestinal: No Nausea, No Vomiting, No Diarrhea, No Constipation, + Pain Genitourinary: no Urinary Incontinence, No Urgency, No Flank Pain Musculoskeletal: No Arthralgias, No Myalgias Skin: No Skin Lesions, No Pruritus, Neuro: + Weakness, No Numbness Psych: No Anxiety/Panic, No Depression, no decrease appetite Heme/Lymph: No Bruising, No Bleeding    Past Medical History:  Diagnosis Date  . Acute on chronic diastolic congestive heart failure ([place]) 03/11/2019  . Acute systolic heart failure ([place]) 03/12/2019  . Anemia   . Anemia, chronic disease 01/23/2017  . AV fistula ([place])   . Benign hypertension with CKD (chronic kidney disease) stage V ([place]) 09/15/2018  . CHF with left ventricular diastolic dysfunction, NYHA class 1 ([place]) 02/02/2017  . Chronic diastolic heart failure ([place]) 11/22/2019  . Chronic kidney disease, stage 5 ([place]) 07/18/2019  . Colon polyp   . COPD mixed type ([place])   . Depression    03/11/21- patient denies  . Dyspnea  too much fluid  . Dyspnea on exertion   . High risk medication use 12/22/2015  . History of blood transfusion   . Hyperlipidemia   . Hypertension   . Hypervolemia 11/13/2019  . Hypokalemia 03/11/2019  . Hyponatremia 03/11/2019  . Hypoxia 03/11/2019  . Insomnia   . Insulin long-term use ([place]) 12/22/2015  . Kidney  disease   . Major depressive disorder, recurrent ([place]) 12/22/2015  . Malaise and fatigue 12/23/2015  . Osteoarthritis   . Osteopenia   . Pneumonia   . 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 SIADH (syndrome of inappropriate ADH production) ([place]) 03/29/2019  . Type 2 diabetes mellitus ([place])   . Type 2 diabetes mellitus, with long-term current use of insulin ([place]) 03/11/2019    Past Surgical History:  Procedure Laterality Date  . A/V FISTULAGRAM Left 02/17/2021   Procedure: A/V FISTULAGRAM;  Surgeon: Waynetta Sandy, MD;  Location: [facility];  Service: Cardiovascular;  Laterality: Left;  . APPENDECTOMY    . AV FISTULA PLACEMENT Left   . AV FISTULA PLACEMENT Left 03/12/2021   Procedure: INSERTION OF LEFT UPPER ARM ARTERIOVENOUS (AV) GORE-TEX GRAFT;  Surgeon: Waynetta Sandy, MD;  Location: [place];  Service: Vascular;  Laterality: Left;  . HYSTEROSCOPY       reports that she has quit smoking. Her smoking use included cigarettes. She quit after 10.00 years of use. She has never used smokeless tobacco. She reports previous alcohol use. She reports that she does not use drugs. Social History  No Known Allergies  Family History  Problem Relation Age of Onset  . Diabetes Mother   . Hypertension Mother   . Diabetes Maternal Grandfather      Prior to Admission medications   Medication Sig Start Date End Date Taking? Authorizing Provider  acetaminophen (TYLENOL) 500 MG tablet Take 500-1,000 mg by mouth every 6 (six) hours as needed (pain).    [provider]  amLODipine (NORVASC) 10 MG tablet Take 10 mg by mouth every evening. 01/14/19   [provider]  ascorbic acid (VITAMIN C) 1000 MG tablet Take 1,000 mg by mouth daily in the afternoon. 12/07/19   [provider]  aspirin 81 MG EC tablet Take 81 mg by mouth in the morning.    [provider]  Biotin 5000 MCG TABS Take 5,000 mcg by mouth daily in the afternoon.    [provider]  Calcium Polycarbophil (FIBER) 625 MG TABS Take 625 mg by mouth every evening. 01/09/20   [provider]  carvedilol (COREG) 6.25 MG tablet Take 6.25 mg by mouth 2 (two) times daily. 12/07/19   [provider]  Cholecalciferol (QC VITAMIN D3) 125 MCG (5000 UT) TABS Take 5,000 Units by mouth in the morning.    [provider]  Cyanocobalamin (VITAMIN B-12 PO) Take 1 capsule by mouth every evening.    [provider]  Docusate Sodium 100 MG capsule Take 100 mg by mouth 2 (two) times daily as needed for constipation. Patient not taking: Reported on 03/07/2021 12/07/19   [provider]  ferric citrate (AURYXIA) 1 GM 210 MG(Fe) tablet Take 210 mg by mouth 3 (three) times daily with meals.    [provider]  furosemide (LASIX) 80 MG tablet Take 80 mg by mouth daily in the afternoon. 12/07/19   [provider]  glimepiride (AMARYL) 4 MG tablet Take 4 mg by mouth 2 (two) times daily. 12/07/19   [provider]  HYDROcodone-acetaminophen (  NORCO) 5-325 MG tablet Take 1 tablet by mouth every 6 (six) hours as needed for moderate pain. 03/12/21   Dagoberto Ligas, PA-C  Insulin Glargine-Lixisenatide [facility]) 100-33 UNT-MCG/ML SOPN Inject 12 Units into the skin in the morning. 12/07/19   [provider]  Lactobacillus (PROBIOTIC ACIDOPHILUS PO) Take 1 capsule by mouth in the morning.    [provider]  lidocaine-prilocaine (EMLA) cream Apply 1 application topically as needed (prior to fisula access). 07/04/20   [provider]  Omega-3 Fatty Acids (FISH OIL) 1000 MG CAPS Take 1,000 mg by mouth in the morning and at bedtime.    [provider]  polyethylene glycol (MIRALAX / GLYCOLAX) 17 g packet Take 17 g by mouth daily as needed for moderate constipation.    [provider]  potassium chloride (KLOR-CON) 10 MEQ tablet Take 10 mEq by mouth daily in the afternoon. 08/23/20   [provider]  Vitamin E 400 units TABS Take 800 Units by mouth in the morning. 01/09/20   [provider]    Physical Exam: Vitals:   03/15/21 2128  BP: (!) 155/89  Pulse: 71  Resp: 15  Temp: 98.6 F (37 C)  TempSrc: Oral  SpO2: 98%    Constitutional: NAD, calm, comfortable, elderly female laying flat in bed  Vitals:   03/15/21 2128  BP: (!) 155/89  Pulse: 71  Resp: 15  Temp: 98.6 F (37 C)  TempSrc: Oral  SpO2: 98%   Eyes: PERRL, lids and conjunctivae normal ENMT: Mucous membranes are moist. Neck: normal, supple Respiratory: clear to auscultation bilaterally, no wheezing, no crackles. Normal respiratory effort. No accessory muscle use.  Cardiovascular: Regular rate and rhythm, no murmurs / rubs / gallops. No extremity edema. Right HD port in place.  Abdomen: no tenderness, no masses palpated. Bowel sounds positive.  Musculoskeletal: no clubbing / cyanosis. No joint deformity upper and lower extremities. Good ROM, no contractures. Normal muscle tone.  Skin: left upper extremity AV fistula with sutures with slight edema at the distal end of the fistula with spreading erythema Neurologic: CN 2-12 grossly intact. Sensation intact,  Strength 5/5 in all 4.  Psychiatric: Normal judgment and insight. Alert and oriented x 3. Normal mood.     Labs on Admission: I have personally reviewed following labs and imaging studies  CBC: Recent Labs  Lab 03/12/21 0918 03/12/21 0924  WBC  --  9.3  HGB 6.8* 7.4*  HCT 20.0* 22.4*  MCV  --  95.7  PLT  --  0000000   Basic Metabolic Panel: Recent Labs  Lab 03/12/21 0918  NA 131*  K 3.6  CL 96*  GLUCOSE 172*  BUN 34*  CREATININE 3.50*   GFR: Estimated Creatinine Clearance: 12.2 mL/min (A) (by C-G formula based on SCr of 3.5 mg/dL (H)). Liver Function Tests: No results for input(s): AST, ALT, ALKPHOS, BILITOT, PROT, ALBUMIN in the last 168 hours. No results for input(s): LIPASE, AMYLASE in the last 168 hours. No  results for input(s): AMMONIA in the last 168 hours. Coagulation Profile: No results for input(s): INR, PROTIME in the last 168 hours. Cardiac Enzymes: No results for input(s): CKTOTAL, CKMB, CKMBINDEX, TROPONINI in the last 168 hours. BNP (last 3 results) No results for input(s): PROBNP in the last 8760 hours. HbA1C: No results for input(s): HGBA1C in the last 72 hours. CBG: Recent Labs  Lab 03/12/21 0851 03/12/21 1049 03/12/21 1213 03/12/21 1339 03/15/21 2306  GLUCAP 143* 152* 156* 186* 208*  Lipid Profile: No results for input(s): CHOL, HDL, LDLCALC, TRIG, CHOLHDL, LDLDIRECT in the last 72 hours. Thyroid Function Tests: No results for input(s): TSH, T4TOTAL, FREET4, T3FREE, THYROIDAB in the last 72 hours. Anemia Panel: No results for input(s): VITAMINB12, FOLATE, FERRITIN, TIBC, IRON, RETICCTPCT in the last 72 hours. Urine analysis: No results found for: COLORURINE, APPEARANCEUR, LABSPEC, PHURINE, GLUCOSEU, HGBUR, BILIRUBINUR, KETONESUR, PROTEINUR, UROBILINOGEN, NITRITE, LEUKOCYTESUR  Radiological Exams on Admission: No results found.    Assessment/Plan  Acute symptomatic anemia  -has hx of anemia requiring transfusion in 2021. Had colonoscopy with polyp removal and endoscopy at [facility]. Loss to follow up for 6 month repeat colonoscopy -Transfuse 2 unit pRBC. Transfusion threshold Hgb <7 -Need GI consult   Elevated troponin  -troponin of 0.36 --> 0.68 -->0.71 at [facility]  -she was evaluated by her primary Cardiology Dr. Berniece Salines at [place] and though her elevated troponin most likely multifactorial due to ESRD and symptomatic anemia -No IV heparin recommended.  Recommended continuing low-dose aspirin, starting Lipitor 40 mg and resuming carvedilol 6.25 mg  ESRD on HD  -Tues/Thurs/Sat- will need HD tomorrow as she missed it today transfering here  Mild to moderate bilateral pleural effusion on CT chest -Stable on room air  -will need HD to help with  overload   Left AV fistula cellulitis/?thrombosis -had revision of AV fistula on 03/12/21 with vascular surgery Dr. Donzetta Matters - start IV Rocephin for cellulitis  - obtain doppler ultrasound to rule out thrombosis-consider vascular surgery consult pending results   Type 2 diabetes with hyperglycemia - normally on 12 U glargine at home -moderate SSI with 10U Lantus daily  Ground glass opacity in the right upper lobe -Incidental finding on CT.  Noncontrast chest CT recommended in 12 months to follow-up follow-up malignancy  HTN  -Hold amlodipine and Lasix for now while receiving transfusion  Chronic diastolic HF -CT chest showing some fluid overload -monitor fluid status closely with HD and transfusion -Hold Lasix while receiving transfusion  DVT prophylaxis:.SCDs Code Status: Full Family Communication: Plan discussed with patient at bedside  disposition Plan: Home with at least 2 midnight stays  Consults called:  Admission status: inpatient  Level of care: Telemetry Cardiac  Status is: Inpatient  Remains inpatient appropriate because:Inpatient level of care appropriate due to severity of illness   Dispo: The patient is from: Home              Anticipated d/c is to: Home              Patient currently is not medically stable to d/c.   Difficult to place patient No         Orene Desanctis DO [facility]   If 7PM-7AM, please contact night-coverage [email or web address]   03/15/2021, 11:23 PM

## 2021-03-15 NOTE — Progress Notes (Signed)
Called lab to confirm that blood cultures would be drawn soon. Awaiting draw for antibiotic administration.

## 2021-03-16 ENCOUNTER — Inpatient Hospital Stay (HOSPITAL_COMMUNITY): Payer: Medicare Other

## 2021-03-16 DIAGNOSIS — M79602 Pain in left arm: Secondary | ICD-10-CM

## 2021-03-16 DIAGNOSIS — I120 Hypertensive chronic kidney disease with stage 5 chronic kidney disease or end stage renal disease: Secondary | ICD-10-CM | POA: Diagnosis not present

## 2021-03-16 DIAGNOSIS — R609 Edema, unspecified: Secondary | ICD-10-CM

## 2021-03-16 DIAGNOSIS — R7989 Other specified abnormal findings of blood chemistry: Secondary | ICD-10-CM

## 2021-03-16 DIAGNOSIS — L0390 Cellulitis, unspecified: Secondary | ICD-10-CM

## 2021-03-16 DIAGNOSIS — D62 Acute posthemorrhagic anemia: Secondary | ICD-10-CM | POA: Diagnosis not present

## 2021-03-16 DIAGNOSIS — D649 Anemia, unspecified: Secondary | ICD-10-CM

## 2021-03-16 DIAGNOSIS — I770 Arteriovenous fistula, acquired: Secondary | ICD-10-CM | POA: Diagnosis not present

## 2021-03-16 DIAGNOSIS — R778 Other specified abnormalities of plasma proteins: Secondary | ICD-10-CM

## 2021-03-16 HISTORY — DX: Cellulitis, unspecified: L03.90

## 2021-03-16 HISTORY — DX: Other specified abnormal findings of blood chemistry: R79.89

## 2021-03-16 LAB — GLUCOSE, CAPILLARY
Glucose-Capillary: 141 mg/dL — ABNORMAL HIGH (ref 70–99)
Glucose-Capillary: 200 mg/dL — ABNORMAL HIGH (ref 70–99)
Glucose-Capillary: 220 mg/dL — ABNORMAL HIGH (ref 70–99)
Glucose-Capillary: 254 mg/dL — ABNORMAL HIGH (ref 70–99)

## 2021-03-16 LAB — IRON AND TIBC
Iron: 27 ug/dL — ABNORMAL LOW (ref 28–170)
Saturation Ratios: 10 % — ABNORMAL LOW (ref 10.4–31.8)
TIBC: 283 ug/dL (ref 250–450)
UIBC: 256 ug/dL

## 2021-03-16 LAB — CBC
HCT: 27.9 % — ABNORMAL LOW (ref 36.0–46.0)
Hemoglobin: 9.4 g/dL — ABNORMAL LOW (ref 12.0–15.0)
MCH: 31.2 pg (ref 26.0–34.0)
MCHC: 33.7 g/dL (ref 30.0–36.0)
MCV: 92.7 fL (ref 80.0–100.0)
Platelets: 187 10*3/uL (ref 150–400)
RBC: 3.01 MIL/uL — ABNORMAL LOW (ref 3.87–5.11)
RDW: 17.4 % — ABNORMAL HIGH (ref 11.5–15.5)
WBC: 10.7 10*3/uL — ABNORMAL HIGH (ref 4.0–10.5)
nRBC: 0.2 % (ref 0.0–0.2)

## 2021-03-16 LAB — BASIC METABOLIC PANEL
Anion gap: 11 (ref 5–15)
BUN: 52 mg/dL — ABNORMAL HIGH (ref 8–23)
CO2: 22 mmol/L (ref 22–32)
Calcium: 8.6 mg/dL — ABNORMAL LOW (ref 8.9–10.3)
Chloride: 99 mmol/L (ref 98–111)
Creatinine, Ser: 4.57 mg/dL — ABNORMAL HIGH (ref 0.44–1.00)
GFR, Estimated: 9 mL/min — ABNORMAL LOW (ref >60)
Glucose, Bld: 185 mg/dL — ABNORMAL HIGH (ref 70–99)
Potassium: 3.2 mmol/L — ABNORMAL LOW (ref 3.5–5.1)
Sodium: 132 mmol/L — ABNORMAL LOW (ref 135–145)

## 2021-03-16 LAB — RETICULOCYTES
Immature Retic Fract: 28.7 % — ABNORMAL HIGH (ref 2.3–15.9)
RBC.: 3.39 MIL/uL — ABNORMAL LOW (ref 3.87–5.11)
Retic Count, Absolute: 246.1 10*3/uL — ABNORMAL HIGH (ref 19.0–186.0)
Retic Ct Pct: 7.3 % — ABNORMAL HIGH (ref 0.4–3.1)

## 2021-03-16 LAB — HEMOGLOBIN A1C
Hgb A1c MFr Bld: 6.9 % — ABNORMAL HIGH (ref 4.8–5.6)
Mean Plasma Glucose: 151.33 mg/dL

## 2021-03-16 LAB — FOLATE: Folate: 7.3 ng/mL (ref >5.9)

## 2021-03-16 LAB — FERRITIN: Ferritin: 1242 ng/mL — ABNORMAL HIGH (ref 11–307)

## 2021-03-16 LAB — VITAMIN B12: Vitamin B-12: 1040 pg/mL — ABNORMAL HIGH (ref 180–914)

## 2021-03-16 LAB — SARS CORONAVIRUS 2 (TAT 6-24 HRS): SARS Coronavirus 2: NEGATIVE

## 2021-03-16 LAB — MRSA PCR SCREENING: MRSA by PCR: NEGATIVE

## 2021-03-16 MED ORDER — INSULIN ASPART 100 UNIT/ML IJ SOLN: 10.0000 [IU] | Freq: Once | INTRAMUSCULAR | Status: DC

## 2021-03-16 MED ORDER — ASPIRIN EC 81 MG PO TBEC
81.0000 mg | DELAYED_RELEASE_TABLET | Freq: Every day | ORAL | Status: DC
  Administered 2021-03-16 – 2021-03-23 (×6): 81 mg via ORAL
  Filled 2021-03-16 (×6): qty 1

## 2021-03-16 MED ORDER — CARVEDILOL 6.25 MG PO TABS
6.2500 mg | ORAL_TABLET | Freq: Two times a day (BID) | ORAL | Status: DC
  Administered 2021-03-16 – 2021-03-23 (×12): 6.25 mg via ORAL
  Filled 2021-03-16 (×12): qty 1

## 2021-03-16 MED ORDER — SODIUM CHLORIDE 0.9 % IV SOLN
INTRAVENOUS | Status: DC | PRN
  Administered 2021-03-16 – 2021-03-17 (×2): 250 mL via INTRAVENOUS

## 2021-03-16 MED ORDER — POLYETHYLENE GLYCOL 3350 17 G PO PACK
17.0000 g | PACK | Freq: Every day | ORAL | Status: DC
  Administered 2021-03-16 – 2021-03-23 (×4): 17 g via ORAL
  Filled 2021-03-16 (×5): qty 1

## 2021-03-16 MED ORDER — INSULIN GLARGINE 100 UNIT/ML ~~LOC~~ SOLN
10.0000 [IU] | Freq: Every day | SUBCUTANEOUS | Status: DC
  Administered 2021-03-17 – 2021-03-23 (×7): 10 [IU] via SUBCUTANEOUS
  Filled 2021-03-16 (×7): qty 0.1

## 2021-03-16 MED ORDER — PANTOPRAZOLE SODIUM 40 MG IV SOLR
40.0000 mg | INTRAVENOUS | Status: DC
  Administered 2021-03-16 – 2021-03-19 (×4): 40 mg via INTRAVENOUS
  Filled 2021-03-16 (×4): qty 40

## 2021-03-16 MED ORDER — INSULIN ASPART 100 UNIT/ML IJ SOLN
0.0000 [IU] | Freq: Three times a day (TID) | INTRAMUSCULAR | Status: DC
Start: 2021-03-17 — End: 2021-03-23
  Administered 2021-03-17: 8 [IU] via SUBCUTANEOUS
  Administered 2021-03-17: 2 [IU] via SUBCUTANEOUS
  Administered 2021-03-17: 3 [IU] via SUBCUTANEOUS
  Administered 2021-03-18 – 2021-03-19 (×2): 2 [IU] via SUBCUTANEOUS
  Administered 2021-03-20: 5 [IU] via SUBCUTANEOUS
  Administered 2021-03-20 – 2021-03-23 (×4): 3 [IU] via SUBCUTANEOUS
  Administered 2021-03-23: 2 [IU] via SUBCUTANEOUS

## 2021-03-16 MED ORDER — GUAIFENESIN-DM 100-10 MG/5ML PO SYRP
5.0000 mL | ORAL_SOLUTION | ORAL | Status: DC | PRN
  Administered 2021-03-16 – 2021-03-23 (×8): 5 mL via ORAL
  Filled 2021-03-16 (×9): qty 5

## 2021-03-16 NOTE — Progress Notes (Signed)
PROGRESS NOTE    Anna Carr  [id number] DOB: 05-Jan-1941 DOA: 03/15/2021 PCP: Raina Mina., MD    No chief complaint on file.   Brief Narrative:    Anna Carr is a 80 y.o. female with medical history significant for ESRD on HD (Tu/Thurs/Sat), chronic diastolic heart failure, hypertension, COPD,  history of GI bleed requiring transfusion, insulin-dependent type 2 diabetes who presents from [facility] for dialysis and GI consult. Patient presented to [facility] yesterday on 5/20 with concerns of increasing shortness of breath, chest tightness weakness and dizziness.   On 5/18 she had revision of her left arm AV fistula. She was noted to have significant anemia with hemoglobin of 6.8. FOBT positive. She reports anemia requiring 2 units of blood transfusion in 2021 and had normal endoscopy and a colonoscopy with 2 benign polyps removed. She was advised to do repeat colonoscopy in 6 months but has been unable to follow up since she did not want to travel to [place] from [place]. She also has been having increased pain and redness to her AV graft since revision on Wednesday.  Assessment & Plan:   Principal Problem:   Acute anemia Active Problems:   Arteriovenous fistula, acquired ([place])   Benign hypertension with CKD (chronic kidney disease) stage V ([name])   Type 2 diabetes mellitus with chronic kidney disease on chronic dialysis, with long-term current use of insulin ([name])   Chronic diastolic heart failure ([name])   Acute blood loss anemia   ESRD on hemodialysis ([name])   Elevated troponin   Cellulitis  Anemia of blood loss:  Unclear etiology.  FOBT is positive, 2 units of PRBC transfusion and hemoglobin has improved. GI consulted for further evaluation. Started on IV protonix.  Anemia panel will be ordered.     Elevated troponins/demand ischemia probably from anemia/symptomatic anemia. Patient currently denies any chest pain.    End-stage renal  disease on dialysis Nephrology on board     Shortness of breath and dizziness probably secondary to missing dialysis yesterday. Volume management with hemodialysis.    Left AV fistula cellulitis/thrombosis Patient had revision of AV fistula on 03/12/2021 with vascular surgery Patient was empirically started on IV Rocephin for cellulitis. Venous duplex of the left upper extremity to evaluate for thrombosis.     Type 2 diabetes mellitus with hyperglycemia Insulin-dependent CBG (last 3)  Recent Labs    03/15/21 2306 03/16/21 0149 03/16/21 1129  GLUCAP 208* 141* 220*   Resume SSI.     Incidental finding of groundglass opacity in the right upper lobe Follow-up with CT in about 10-year for evaluation of malignancy.    Essential hypertension Blood pressure parameters appear to be optimal.   Chronic diastolic heart failure Fluid management with HD.    DVT prophylaxis: SCD'S Code Status: full code.  Family Communication: none at bedside.  Disposition:   Status is: Inpatient  Remains inpatient appropriate because:Ongoing diagnostic testing needed not appropriate for outpatient work up and IV treatments appropriate due to intensity of illness or inability to take PO   Dispo: The patient is from: Home              Anticipated d/c is to: Home              Patient currently is not medically stable to d/c.   Difficult to place patient No       Consultants:  Nephrology Gastroenterology.   Procedures: none.  Antimicrobials:  Antibiotics Given (last 72  hours)    None          Subjective: PT REPORTS PAIN in the left arm.   Objective: Vitals:   03/16/21 0002 03/16/21 0350 03/16/21 0403 03/16/21 0900  BP: (!) 172/63 (!) 188/61 (!) 154/64 (!) 167/53  Pulse: 72 85 82 88  Resp: '19 18 19 18  '$ Temp: 97.8 F (36.6 C) 98.6 F (37 C)  99.4 F (37.4 C)  TempSrc: Oral Oral  Oral  SpO2: 96% 98% 98% 97%  Weight: 74.6 kg       Intake/Output Summary (Last  24 hours) at 03/16/2021 1300 Last data filed at 03/16/2021 1200 Gross per 24 hour  Intake 580 ml  Output 750 ml  Net -170 ml   Filed Weights   03/16/21 0002  Weight: 74.6 kg    Examination:  General exam: Appears calm and comfortable  Respiratory system: Clear to auscultation. Respiratory effort normal. Cardiovascular system: S1 & S2 heard, RRR. No JVD, . No pedal edema. Gastrointestinal system: Abdomen is nondistended, soft and nontender.. Normal bowel sounds heard. Central nervous system: Alert and oriented. No focal neurological deficits. Extremities: Symmetric 5 x 5 power. Skin: No rashes, lesions or ulcers Psychiatry: . Mood & affect appropriate.     Data Reviewed: I have personally reviewed following labs and imaging studies  CBC: Recent Labs  Lab 03/12/21 0918 03/12/21 0924 03/16/21 0033  WBC  --  9.3 10.7*  HGB 6.8* 7.4* 9.4*  HCT 20.0* 22.4* 27.9*  MCV  --  95.7 92.7  PLT  --  193 [id number]    Basic Metabolic Panel: Recent Labs  Lab 03/12/21 0918 03/16/21 0033  NA 131* 132*  K 3.6 3.2*  CL 96* 99  CO2  --  22  GLUCOSE 172* 185*  BUN 34* 52*  CREATININE 3.50* 4.57*  CALCIUM  --  8.6*    GFR: Estimated Creatinine Clearance: 9.4 mL/min (A) (by C-G formula based on SCr of 4.57 mg/dL (H)).  Liver Function Tests: No results for input(s): AST, ALT, ALKPHOS, BILITOT, PROT, ALBUMIN in the last 168 hours.  CBG: Recent Labs  Lab 03/12/21 1213 03/12/21 1339 03/15/21 2306 03/16/21 0149 03/16/21 1129  GLUCAP 156* 186* 208* 141* 220*     Recent Results (from the past 240 hour(s))  SARS CORONAVIRUS 2 (TAT 6-24 HRS) Nasopharyngeal Nasopharyngeal Swab     Status: None   Collection Time: 03/10/21 10:43 AM   Specimen: Nasopharyngeal Swab  Result Value Ref Range Status   SARS Coronavirus 2 NEGATIVE NEGATIVE Final    Comment: (NOTE) SARS-CoV-2 target nucleic acids are NOT DETECTED.  The SARS-CoV-2 RNA is generally detectable in upper and lower respiratory  specimens during the acute phase of infection. Negative results do not preclude SARS-CoV-2 infection, do not rule out co-infections with other pathogens, and should not be used as the sole basis for treatment or other patient management decisions. Negative results must be combined with clinical observations, patient history, and epidemiological information. The expected result is Negative.  Fact Sheet for Patients: SugarRoll.be  Fact Sheet for Healthcare Providers: https://www.woods-mathews.com/  This test is not yet approved or cleared by the [place] FDA and  has been authorized for detection and/or diagnosis of SARS-CoV-2 by FDA under an Emergency Use Authorization (EUA). This EUA will remain  in effect (meaning this test can be used) for the duration of the COVID-19 declaration under Se ction 564(b)(1) of the Act, 21 U.S.C. section 360bbb-3(b)(1), unless the authorization is terminated or revoked sooner.  Performed at [facility], [place] [address] [place], [place] [address]   SARS CORONAVIRUS 2 (TAT 6-24 HRS) Nasopharyngeal     Status: None   Collection Time: 03/15/21  9:53 PM   Specimen: Nasopharyngeal  Result Value Ref Range Status   SARS Coronavirus 2 NEGATIVE NEGATIVE Final    Comment: (NOTE) SARS-CoV-2 target nucleic acids are NOT DETECTED.  The SARS-CoV-2 RNA is generally detectable in upper and lower respiratory specimens during the acute phase of infection. Negative results do not preclude SARS-CoV-2 infection, do not rule out co-infections with other pathogens, and should not be used as the sole basis for treatment or other patient management decisions. Negative results must be combined with clinical observations, patient history, and epidemiological information. The expected result is Negative.  Fact Sheet for Patients: SugarRoll.be  Fact Sheet for Healthcare  Providers: https://www.woods-mathews.com/  This test is not yet approved or cleared by the [place] FDA and  has been authorized for detection and/or diagnosis of SARS-CoV-2 by FDA under an Emergency Use Authorization (EUA). This EUA will remain  in effect (meaning this test can be used) for the duration of the COVID-19 declaration under Se ction 564(b)(1) of the Act, 21 U.S.C. section 360bbb-3(b)(1), unless the authorization is terminated or revoked sooner.  Performed at [facility], [place] [address] [place], [place] [address]   MRSA PCR Screening     Status: None   Collection Time: 03/15/21 10:10 PM   Specimen: Nasopharyngeal  Result Value Ref Range Status   MRSA by PCR NEGATIVE NEGATIVE Final    Comment:        The GeneXpert MRSA Assay (FDA approved for NASAL specimens only), is one component of a comprehensive MRSA colonization surveillance program. It is not intended to diagnose MRSA infection nor to guide or monitor treatment for MRSA infections. Performed at [facility], [place] [address] [place], [place] [address]          Radiology Studies: VAS [place] UPPER EXTREMITY VENOUS DUPLEX  Result Date: 03/16/2021 UPPER VENOUS STUDY  Patient Name:  NUBE VANOSTEN  Date of Exam:   03/16/2021 Medical Rec #: [id number]          Accession #:    [id number] Date of Birth: 1941-09-28          Patient Gender: F Patient Age:   56Y Exam Location:  [facility] Procedure:      VAS [place] UPPER EXTREMITY VENOUS DUPLEX Referring Phys: [id number] [place] T TU --------------------------------------------------------------------------------  Indications: left upper extremity pain and edema s/p LUE AVG placement 03/12/21 Limitations: Bandages and open wound. Comparison Study: No prior study Performing Technologist: Maudry Mayhew MHA, RDMS, RVT, RDCS  Examination Guidelines: A complete evaluation includes B-mode imaging, spectral Doppler, color Doppler, and power  Doppler as needed of all accessible portions of each vessel. Bilateral testing is considered an integral part of a complete examination. Limited examinations for reoccurring indications may be performed as noted.  Right Findings: +----------+------------+---------+-----------+----------+---------------------+ RIGHT     CompressiblePhasicitySpontaneousProperties       Summary        +----------+------------+---------+-----------+----------+---------------------+ Subclavian                                           Unable to visualize  due to bandaging    +----------+------------+---------+-----------+----------+---------------------+  Left Findings: +----------+------------+---------+-----------+----------+--------------+ LEFT      CompressiblePhasicitySpontaneousProperties   Summary     +----------+------------+---------+-----------+----------+--------------+ IJV           Full       Yes       Yes                             +----------+------------+---------+-----------+----------+--------------+ Subclavian    Full       Yes       Yes                             +----------+------------+---------+-----------+----------+--------------+ Axillary      Full       Yes       Yes                             +----------+------------+---------+-----------+----------+--------------+ Brachial      Full       Yes       Yes                             +----------+------------+---------+-----------+----------+--------------+ Radial        Full                                                 +----------+------------+---------+-----------+----------+--------------+ Ulnar         Full                                                 +----------+------------+---------+-----------+----------+--------------+ Cephalic                                            Not visualized  +----------+------------+---------+-----------+----------+--------------+ Basilic       Full                                                 +----------+------------+---------+-----------+----------+--------------+  Summary:  Left: No evidence of deep vein thrombosis in the upper extremity. No evidence of superficial vein thrombosis in the upper extremity.  *See table(s) above for measurements and observations.    Preliminary         Scheduled Meds: . aspirin EC  81 mg Oral Daily  . carvedilol  6.25 mg Oral BID WC  . insulin aspart  0-15 Units Subcutaneous TID PC,HS,0200  . insulin aspart  10 Units Subcutaneous Once  . [START ON 03/17/2021] insulin glargine  10 Units Subcutaneous Daily   Continuous Infusions: . cefTRIAXone (ROCEPHIN)  IV 1 g (03/16/21 0057)     LOS: 1 day        Hosie Poisson, MD [facility]   To contact the attending provider between 7A-7P or the covering provider during after hours 7P-7A, please log into the web site  [email or web address] and access using universal [place] password for that web site. If you do not have the password, please call the hospital operator.  03/16/2021, 1:00 PM

## 2021-03-16 NOTE — Consult Note (Signed)
Consultation  Referring Provider: TRH/ Karleen Hampshire Primary Care Physician:  Raina Mina., MD Primary Gastroenterologist:  Dr. Valinda Hoar  Reason for Consultation: Anemia heme positive stool  HPI: Anna Carr is a 81 y.o. female with multiple comorbidities who was admitted in transfer from [facility] yesterday after she had presented with chest pain and progressive shortness of breath over the past week. She was evaluated by cardiology at [facility] and chest pain felt to be demand related due to profound anemia with finding of hemoglobin of 6.8.  She was documented heme positive. She had been noting abdominal pain over the past week as well since Monday.  She says she felt like she had a GI bug with upper abdominal discomfort, upset stomach indigestion belching.  This lasted for 3 to 4 days.  She did not have any vomiting or hematemesis, no diarrhea melena or hematochezia.  No associated fever she has chronically dark stool on oral iron/ferric citrate, and does take aspirin daily, no blood thinners.  Patient has history of end-stage renal disease, on dialysis, congestive heart failure, hypertension, COPD, insulin-dependent diabetes mellitus and prior history of GI bleeding.  She had just undergone revision of her dialysis graft on 03/12/2021.  Reviewing records her hemoglobin was 11.6 in April 2022.  She has been transfused 2 units of packed RBCs and hemoglobin is 9.4 this morning hematocrit 27.9 platelets 187. Scheduled for dialysis tomorrow.  Is not complaining of any abdominal pain today, was able to eat solid food for lunch.  She says she feels full and uncomfortable because she has not had dialysis, denies any chest tightness today  Patient did have endoscopy and colonoscopy in March 2021 in [facility] gastroenterology.  Per the notes available in epic EGD was normal.  At colonoscopy she had 2 polyps removed 1 of these was larger and  removed piecemeal and follow-up was recommended in 6 months.  I cannot see the path results from that polyp.  Patient did not return for follow-up colonoscopy says she cannot afford to drive back and forth [place].   Past Medical History:  Diagnosis Date  . Acute on chronic diastolic congestive heart failure ([place]) 03/11/2019  . Acute systolic heart failure ([place]) 03/12/2019  . Anemia   . Anemia, chronic disease 01/23/2017  . AV fistula ([place])   . Benign hypertension with CKD (chronic kidney disease) stage V ([place]) 09/15/2018  . CHF with left ventricular diastolic dysfunction, NYHA class 1 ([place]) 02/02/2017  . Chronic diastolic heart failure ([place]) 11/22/2019  . Chronic kidney disease, stage 5 ([place]) 07/18/2019  . Colon polyp   . COPD mixed type ([place])   . Depression    03/11/21- patient denies  . Dyspnea    too much fluid  . Dyspnea on exertion   . High risk medication use 12/22/2015  . History of blood transfusion   . Hyperlipidemia   . Hypertension   . Hypervolemia 11/13/2019  . Hypokalemia 03/11/2019  . Hyponatremia 03/11/2019  . Hypoxia 03/11/2019  . Insomnia   . Insulin long-term use ([place]) 12/22/2015  . Kidney disease   . Major depressive disorder, recurrent ([place]) 12/22/2015  . Malaise and fatigue 12/23/2015  . Osteoarthritis   . Osteopenia   . Pneumonia   . 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 Type 2 diabetes mellitus ([place])   . Type 2 diabetes mellitus, with long-term current use of insulin ([place]) 03/11/2019    Past Surgical  History:  Procedure Laterality Date  . A/V FISTULAGRAM Left 02/17/2021   Procedure: A/V FISTULAGRAM;  Surgeon: Waynetta Sandy, MD;  Location: [facility];  Service: Cardiovascular;  Laterality: Left;  . APPENDECTOMY    . AV FISTULA PLACEMENT Left   . AV FISTULA PLACEMENT Left 03/12/2021   Procedure: INSERTION OF LEFT UPPER ARM ARTERIOVENOUS (AV) GORE-TEX GRAFT;  Surgeon: Waynetta Sandy, MD;  Location: [facility];  Service: Vascular;  Laterality: Left;  . HYSTEROSCOPY      Prior to Admission medications   Medication Sig Start Date End Date Taking? Authorizing Provider  acetaminophen (TYLENOL) 500 MG tablet Take 500-1,000 mg by mouth every 6 (six) hours as needed (pain).   Yes [provider]  amLODipine (NORVASC) 10 MG tablet Take 10 mg by mouth every evening. 01/14/19  Yes [provider]  ascorbic acid (VITAMIN C) 1000 MG tablet Take 1,000 mg by mouth daily in the afternoon. 12/07/19  Yes [provider]  aspirin 81 MG EC tablet Take 81 mg by mouth in the morning.   Yes [provider]  Biotin 5000 MCG TABS Take 5,000 mcg by mouth daily in the afternoon.   Yes [provider]  Calcium Polycarbophil (FIBER) 625 MG TABS Take 625 mg by mouth every evening. 01/09/20  Yes [provider]  carboxymethylcellulose (REFRESH PLUS) 0.5 % SOLN Place 1 drop into both eyes 3 (three) times daily as needed (dry eyes).   Yes [provider]  carvedilol (COREG) 6.25 MG tablet Take 6.25 mg by mouth 2 (two) times daily. 12/07/19  Yes [provider]  Cholecalciferol (QC VITAMIN D3) 125 MCG (5000 UT) TABS Take 5,000 Units by mouth in the morning.   Yes [provider]  Cyanocobalamin (VITAMIN B-12 PO) Take 1 capsule by mouth every evening.   Yes [provider]  Docusate Sodium 100 MG capsule Take 100 mg by mouth 2 (two) times daily as needed for constipation. 12/07/19  Yes [provider]  ferric citrate (AURYXIA) 1 GM 210 MG(Fe) tablet Take 210 mg by mouth 3 (three) times daily with meals.   Yes [provider]  furosemide (LASIX) 80 MG tablet Take 80 mg by mouth daily in the afternoon. 12/07/19  Yes [provider]  glimepiride (AMARYL) 4 MG tablet Take 4 mg by mouth 2 (two) times daily. 12/07/19  Yes [provider]  HYDROcodone-acetaminophen (NORCO) 5-325 MG  tablet Take 1 tablet by mouth every 6 (six) hours as needed for moderate pain. 03/12/21  Yes Dagoberto Ligas, PA-C  Insulin Glargine-Lixisenatide [facility]) 100-33 UNT-MCG/ML SOPN Inject 12 Units into the skin in the morning. 12/07/19  Yes [provider]  Lactobacillus (PROBIOTIC ACIDOPHILUS PO) Take 1 capsule by mouth in the morning.   Yes [provider]  polyethylene glycol (MIRALAX / GLYCOLAX) 17 g packet Take 17 g by mouth daily as needed for moderate constipation.   Yes [provider]  Vitamin E 400 units TABS Take 800 Units by mouth in the morning. 01/09/20  Yes [provider]  lidocaine-prilocaine (EMLA) cream Apply 1 application topically as needed (prior to fisula access). Patient not taking: Reported on 03/16/2021 07/04/20   [provider]  Omega-3 Fatty Acids (FISH OIL) 1000 MG CAPS Take 1,000 mg by mouth in the morning and at bedtime.    [provider]  potassium chloride (KLOR-CON) 10 MEQ tablet Take 10 mEq by mouth daily in the afternoon. 08/23/20   [provider]    Current Facility-Administered Medications  Medication Dose Route Frequency Provider Last Rate Last Admin  . aspirin EC tablet 81 mg  81 mg Oral Daily Tu, Ching T, DO   81 mg at 03/16/21 1008  . carvedilol (COREG) tablet 6.25 mg  6.25 mg Oral BID WC Tu, Ching T, DO   6.25 mg at 03/16/21 1007  . cefTRIAXone (ROCEPHIN) 1 g in sodium chloride 0.9 % 100 mL IVPB  1 g Intravenous QHS Tu, Ching T, DO 200 mL/hr at 03/16/21 0057 1 g at 03/16/21 0057  . guaiFENesin-dextromethorphan (ROBITUSSIN DM) 100-10 MG/5ML syrup 5 mL  5 mL Oral Q4H PRN Tu, Ching T, DO   5 mL at 03/16/21 1236  . insulin aspart (novoLOG) injection 0-15 Units  0-15 Units Subcutaneous TID PC,HS,0200 Tu, Ching T, DO   5 Units at 03/16/21 1230  . insulin aspart (novoLOG) injection 10 Units  10 Units Subcutaneous Once Tu, Ching T, DO      . [START ON 03/17/2021] insulin glargine (LANTUS) injection 10  Units  10 Units Subcutaneous Daily Tu, Ching T, DO      . pantoprazole (PROTONIX) injection 40 mg  40 mg Intravenous Q24H Hosie Poisson, MD        Allergies as of 03/14/2021  . (No Known Allergies)    Family History  Problem Relation Age of Onset  . Diabetes Mother   . Hypertension Mother   . Diabetes Maternal Grandfather     Social History   Socioeconomic History  . Marital status: Widowed    Spouse name: Not on file  . Number of children: Not on file  . Years of education: Not on file  . Highest education level: Not on file  Occupational History  . Not on file  Tobacco Use  . Smoking status: Former Smoker    Years: 10.00    Types: Cigarettes  . Smokeless tobacco: Never Used  . Tobacco comment: quit in 1973  Vaping Use  . Vaping Use: Never used  Substance and Sexual Activity  . Alcohol use: Not Currently  . Drug use: Never  . Sexual activity: Not on file  Other Topics Concern  . Not on file  Social History Narrative  . Not on file   Social Determinants of Health   Financial Resource Strain: Not on file  Food Insecurity: Not on file  Transportation Needs: Not on file  Physical Activity: Not on file  Stress: Not on file  Social Connections: Not on file  Intimate Partner Violence: Not on file    Review of Systems: Pertinent positive and negative review of systems were noted in the above HPI section.  All other review of systems was otherwise negative.  Physical Exam: Vital signs in last 24 hours: Temp:  [97.8 F (36.6 C)-99.4 F (37.4 C)] 99.4 F (37.4 C) (05/22 0900) Pulse Rate:  [71-88] 88 (05/22 0900) Resp:  [15-19] 18 (05/22 0900) BP: (154-188)/(53-89) 167/53 (05/22 0900) SpO2:  [96 %-98 %] 97 % (05/22 0900) Weight:  [74.6 kg] 74.6 kg (05/22 0002) Last BM Date: 03/15/21 General:   Alert,  Well-developed, chronically ill-appearing elderly white female pleasant and cooperative in NAD Head:  Normocephalic and atraumatic. Eyes:  Sclera clear, no  icterus.   Conjunctiva pale . Ears:  Normal auditory acuity. Nose:  No deformity, discharge,  or lesions. Mouth:  No deformity or lesions.   Neck:  Supple; no masses or thyromegaly. Lungs:  Clear throughout to auscultation.  No wheezes, crackles, or rhonchi. Heart:  Regular rate and rhythm; no murmurs, clicks, rubs,  or gallops. Abdomen:  Soft, obese, no focal tenderness,, BS active,nonpalp mass or hsm.   Rectal: Documented heme positive Msk:  Symmetrical without gross deformities. . Pulses:  Normal pulses noted. Extremities:  Without clubbing or edema. Neurologic:  Alert and  oriented x4;  grossly normal neurologically. Skin:  Intact without significant lesions or rashes.. Psych:  Alert and cooperative. Normal mood and affect.  Intake/Output from previous day: 05/21 0701 - 05/22 0700 In: 220 [P.O.:120; IV Piggyback:100] Out: 750 [Urine:750] Intake/Output this shift: Total I/O In: 360 [P.O.:360] Out: -   Lab Results: Recent Labs    03/16/21 0033  WBC 10.7*  HGB 9.4*  HCT 27.9*  PLT 187   BMET Recent Labs    03/16/21 0033  NA 132*  K 3.2*  CL 99  CO2 22  GLUCOSE 185*  BUN 52*  CREATININE 4.57*  CALCIUM 8.6*   LFT No results for input(s): PROT, ALBUMIN, AST, ALT, ALKPHOS, BILITOT, BILIDIR, IBILI in the last 72 hours. PT/INR No results for input(s): LABPROT, INR in the last 72 hours. Hepatitis Panel No results for input(s): HEPBSAG, HCVAB, HEPAIGM, HEPBIGM in the last 72 hours.   IMPRESSION:  #35  80 year old white female with end-stage renal disease on dialysis admitted to [facility] 03/14/2021 and transferred here for dialysis and GI evaluation. Patient had chest pain/tightness and shortness of breath on admission, was seen by her cardiologist  at [facility] after positive troponins however this was felt to be multifactorial/demand and further cardiac work-up was not recommended. Patient had hemoglobin of 6.8 on admission there No overt GI bleeding but  documented heme positive  Plan is for dialysis tomorrow  #2 profound anemia, heme positive stool. Baseline hemoglobin about 11 Etiology of marked anemia not clear.  Patient had undergone outpatient GI evaluation [place] March 2021 with normal EGD and colonoscopy with removal of 2 polyps 1 of which was large and removed piecemeal and she was recommended for 48-monthfollow-up. Patient also had upper abdominal pain/discomfort earlier this week with nausea  Will need to rule out upper GI sources i.e. gastropathy, peptic ulcer disease, AVMs, rule out occult lower GI blood loss secondary to polyps, occult neoplasm, AVMs  #2 congestive heart failure #3 hypertension 4.  COPD 5.  Insulin-dependent diabetes mellitus  Plan; dialysis tomorrow Serial hemoglobins and transfuse to keep hemoglobin above 7 Twice daily PPI short-term Check iron studies We will plan for endoscopic evaluation on Tuesday with Dr. DLoletha Carrow  Plan will be for bowel prep after she completes dialysis tomorrow, then EGD and colonoscopy on Tuesday, 03/18/2021.  Procedures were discussed in detail with the patient including indications risks and benefits and she is agreeable to proceed.     Berline Semrad EsterwoodPA-C  03/16/2021, 1:20 PM

## 2021-03-16 NOTE — Consult Note (Signed)
[facility] Renal Consultation Note    Indication for Consultation:  Management of ESRD/hemodialysis; anemia, hypertension/volume and secondary hyperparathyroidism  HPI: Anna Carr is a 80 y.o. female with ESRD on HD TTS, HTN, DM, chronic dCHF, HTN, COPD, H/x GI bleeding.  Presented to [facility] Friday with dizziness, chest tightness, weakness. Per notes found to be anemic with Hgb 6.8 and  +FOBT. She was transfused at OSH and transferred to [facility] for dialysis needs and GI consult. She recently had conversion of left arm AVF to AVG on 5/18 by Dr. Donzetta Matters.   Dialysis at [facility] TTS. Dialysis via [facility].  Last dialysis was Thursday 5/19. She missed dialysis Saturday d/t hospital admission/transfer.  Labs unremarkable for ESRD patient. Hgb improved to 9.4 following transfusion. Empiric antibiotics and blood cultures collected with concern for left arm cellulitis.   Seen and examined at bedside. Breathing comfortably on room air. She had some GI upset this am, but denies chest pain or dyspnea overnight.    Past Medical History:  Diagnosis Date  . Acute on chronic diastolic congestive heart failure ([place]) 03/11/2019  . Acute systolic heart failure ([place]) 03/12/2019  . Anemia   . Anemia, chronic disease 01/23/2017  . AV fistula ([place])   . Benign hypertension with CKD (chronic kidney disease) stage V ([place]) 09/15/2018  . CHF with left ventricular diastolic dysfunction, NYHA class 1 ([place]) 02/02/2017  . Chronic diastolic heart failure ([place]) 11/22/2019  . Chronic kidney disease, stage 5 ([place]) 07/18/2019  . Colon polyp   . COPD mixed type ([place])   . Depression    03/11/21- patient denies  . Dyspnea    too much fluid  . Dyspnea on exertion   . High risk medication use 12/22/2015  . History of blood transfusion   . Hyperlipidemia   . Hypertension   . Hypervolemia 11/13/2019  . Hypokalemia 03/11/2019  . Hyponatremia 03/11/2019  . Hypoxia 03/11/2019  . Insomnia   .  Insulin long-term use ([place]) 12/22/2015  . Kidney disease   . Major depressive disorder, recurrent ([place]) 12/22/2015  . Malaise and fatigue 12/23/2015  . Osteoarthritis   . Osteopenia   . Pneumonia   . 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 Type 2 diabetes mellitus ([place])   . Type 2 diabetes mellitus, with long-term current use of insulin ([place]) 03/11/2019   Past Surgical History:  Procedure Laterality Date  . A/V FISTULAGRAM Left 02/17/2021   Procedure: A/V FISTULAGRAM;  Surgeon: Waynetta Sandy, MD;  Location: [facility];  Service: Cardiovascular;  Laterality: Left;  . APPENDECTOMY    . AV FISTULA PLACEMENT Left   . AV FISTULA PLACEMENT Left 03/12/2021   Procedure: INSERTION OF LEFT UPPER ARM ARTERIOVENOUS (AV) GORE-TEX GRAFT;  Surgeon: Waynetta Sandy, MD;  Location: [facility];  Service: Vascular;  Laterality: Left;  . HYSTEROSCOPY     Family History  Problem Relation Age of Onset  . Diabetes Mother   . Hypertension Mother   . Diabetes Maternal Grandfather    Social History:  reports that she has quit smoking. Her smoking use included cigarettes. She quit after 10.00 years of use. She has never used smokeless tobacco. She reports previous alcohol use. She reports that she does not use drugs. No Known Allergies Prior to Admission medications   Medication Sig Start Date End Date Taking? Authorizing Provider  acetaminophen (TYLENOL) 500 MG tablet Take 500-1,000 mg by mouth  every 6 (six) hours as needed (pain).   Yes [provider]  amLODipine (NORVASC) 10 MG tablet Take 10 mg by mouth every evening. 01/14/19  Yes [provider]  ascorbic acid (VITAMIN C) 1000 MG tablet Take 1,000 mg by mouth daily in the afternoon. 12/07/19  Yes [provider]  aspirin 81 MG EC tablet Take 81 mg by mouth in the morning.   Yes [provider]  Biotin 5000 MCG TABS Take 5,000 mcg by  mouth daily in the afternoon.   Yes [provider]  Calcium Polycarbophil (FIBER) 625 MG TABS Take 625 mg by mouth every evening. 01/09/20  Yes [provider]  carboxymethylcellulose (REFRESH PLUS) 0.5 % SOLN Place 1 drop into both eyes 3 (three) times daily as needed (dry eyes).   Yes [provider]  carvedilol (COREG) 6.25 MG tablet Take 6.25 mg by mouth 2 (two) times daily. 12/07/19  Yes [provider]  Cholecalciferol (QC VITAMIN D3) 125 MCG (5000 UT) TABS Take 5,000 Units by mouth in the morning.   Yes [provider]  Cyanocobalamin (VITAMIN B-12 PO) Take 1 capsule by mouth every evening.   Yes [provider]  Docusate Sodium 100 MG capsule Take 100 mg by mouth 2 (two) times daily as needed for constipation. 12/07/19  Yes [provider]  ferric citrate (AURYXIA) 1 GM 210 MG(Fe) tablet Take 210 mg by mouth 3 (three) times daily with meals.   Yes [provider]  furosemide (LASIX) 80 MG tablet Take 80 mg by mouth daily in the afternoon. 12/07/19  Yes [provider]  glimepiride (AMARYL) 4 MG tablet Take 4 mg by mouth 2 (two) times daily. 12/07/19  Yes [provider]  HYDROcodone-acetaminophen (NORCO) 5-325 MG tablet Take 1 tablet by mouth every 6 (six) hours as needed for moderate pain. 03/12/21  Yes Dagoberto Ligas, PA-C  Insulin Glargine-Lixisenatide [facility]) 100-33 UNT-MCG/ML SOPN Inject 12 Units into the skin in the morning. 12/07/19  Yes [provider]  Lactobacillus (PROBIOTIC ACIDOPHILUS PO) Take 1 capsule by mouth in the morning.   Yes [provider]  polyethylene glycol (MIRALAX / GLYCOLAX) 17 g packet Take 17 g by mouth daily as needed for moderate constipation.   Yes [provider]  Vitamin E 400 units TABS Take 800 Units by mouth in the morning. 01/09/20  Yes [provider]  lidocaine-prilocaine (EMLA) cream Apply 1 application topically as needed (prior  to fisula access). Patient not taking: Reported on 03/16/2021 07/04/20   [provider]  Omega-3 Fatty Acids (FISH OIL) 1000 MG CAPS Take 1,000 mg by mouth in the morning and at bedtime.    [provider]  potassium chloride (KLOR-CON) 10 MEQ tablet Take 10 mEq by mouth daily in the afternoon. 08/23/20   [provider]   Current Facility-Administered Medications  Medication Dose Route Frequency Provider Last Rate Last Admin  . aspirin EC tablet 81 mg  81 mg Oral Daily Tu, Ching T, DO   81 mg at 03/16/21 1008  . carvedilol (COREG) tablet 6.25 mg  6.25 mg Oral BID WC Tu, Ching T, DO   6.25 mg at 03/16/21 1007  . cefTRIAXone (ROCEPHIN) 1 g in sodium chloride 0.9 % 100 mL IVPB  1 g Intravenous QHS Tu, Ching T, DO 200 mL/hr at 03/16/21 0057 1 g at 03/16/21 0057  . guaiFENesin-dextromethorphan (ROBITUSSIN DM) 100-10 MG/5ML syrup 5 mL  5 mL Oral Q4H PRN Ileene Musa  T, DO   5 mL at 03/16/21 0353  . insulin aspart (novoLOG) injection 0-15 Units  0-15 Units Subcutaneous TID PC,HS,0200 Tu, Ching T, DO   2 Units at 03/16/21 0155  . insulin aspart (novoLOG) injection 10 Units  10 Units Subcutaneous Once Tu, Ching T, DO      . [START ON 03/17/2021] insulin glargine (LANTUS) injection 10 Units  10 Units Subcutaneous Daily Tu, Ching T, DO         ROS: As per HPI otherwise negative.  Physical Exam: Vitals:   03/16/21 0002 03/16/21 0350 03/16/21 0403 03/16/21 0900  BP: (!) 172/63 (!) 188/61 (!) 154/64 (!) 167/53  Pulse: 72 85 82 88  Resp: '19 18 19 18  '$ Temp: 97.8 F (36.6 C) 98.6 F (37 C)  99.4 F (37.4 C)  TempSrc: Oral Oral  Oral  SpO2: 96% 98% 98% 97%  Weight: 74.6 kg        General: WDWN woman, nad  Head: NCAT sclera not icteric MMM Neck: Supple. No JVD appreciated  Lungs: Clear bilaterally. No wheeze or rales  Heart: RRR with S1 S2 Abdomen: soft non-tender  Lower extremities:without edema or ischemic changes, no open wounds  Neuro: A & O  X 3. Moves all extremities  spontaneously. Psych:  Responds to questions appropriately with a normal affect. Dialysis Access: R IJ TDC;  New LUE AVG; no erythema, drainage   Labs: Basic Metabolic Panel: Recent Labs  Lab 03/12/21 0918 03/16/21 0033  NA 131* 132*  K 3.6 3.2*  CL 96* 99  CO2  --  22  GLUCOSE 172* 185*  BUN 34* 52*  CREATININE 3.50* 4.57*  CALCIUM  --  8.6*   Liver Function Tests: No results for input(s): AST, ALT, ALKPHOS, BILITOT, PROT, ALBUMIN in the last 168 hours. No results for input(s): LIPASE, AMYLASE in the last 168 hours. No results for input(s): AMMONIA in the last 168 hours. CBC: Recent Labs  Lab 03/12/21 0918 03/12/21 0924 03/16/21 0033  WBC  --  9.3 10.7*  HGB 6.8* 7.4* 9.4*  HCT 20.0* 22.4* 27.9*  MCV  --  95.7 92.7  PLT  --  193 187   Cardiac Enzymes: No results for input(s): CKTOTAL, CKMB, CKMBINDEX, TROPONINI in the last 168 hours. CBG: Recent Labs  Lab 03/12/21 1049 03/12/21 1213 03/12/21 1339 03/15/21 2306 03/16/21 0149  GLUCAP 152* 156* 186* 208* 141*   Iron Studies: No results for input(s): IRON, TIBC, TRANSFERRIN, FERRITIN in the last 72 hours. Studies/Results: No results found.  Dialysis Orders:  [facility] TTS 3.5h 400/500 EDW 73kg 3K/2.5Ca UPF 2 TDC  Mircera 30 q 4 weeks (last 5/17)    Assessment/Plan: 1. GIB --+FOBT. GI has been consulted here. s/p transfusion at OSH. Hgb improved to 9.4  2. ESRD -  HD TTS. Missed Saturday d/t being in hospital. No urgent indication today. Plan HD in am tomorrow then back on TTS schedule.  3. Dialysis access: s/p conversion LA AVF to AVG 5/18 per Dr. Donzetta Matters. Some swelling noted, monitor. Had Doppler study today.  4. Hypertension/volume  - BP elevated. Continue home meds- on amlodipine, carvedilol. Should also improve with UF on HD tomorrow.  5.  Anemia  - s/p transfusion as above. Recent ESA dose as outpatient. Will give another 30 mcg with HD Tues. Follow trends.  6. Metabolic bone disease -  Ca at goal. Continue  home binders. Check Phos here 7. DMT2 - insulin per primary    Lynnda Child  PA-C [employer] 03/16/2021, 10:19 AM

## 2021-03-16 NOTE — Plan of Care (Signed)
  Problem: Education: Goal: Knowledge of General Education information will improve Description: Including pain rating scale, medication(s)/side effects and non-pharmacologic comfort measures Outcome: Progressing   Problem: Health Behavior/Discharge Planning: Goal: Ability to manage health-related needs will improve Outcome: Progressing   Problem: Clinical Measurements: Goal: Ability to maintain clinical measurements within normal limits will improve Outcome: Progressing Goal: Will remain free from infection Outcome: Progressing Goal: Diagnostic test results will improve Outcome: Progressing Goal: Respiratory complications will improve Outcome: Progressing Goal: Cardiovascular complication will be avoided Outcome: Progressing   Problem: Activity: Goal: Risk for activity intolerance will decrease Outcome: Progressing   Problem: Coping: Goal: Level of anxiety will decrease Outcome: Progressing   Problem: Safety: Goal: Ability to remain free from injury will improve Outcome: Progressing

## 2021-03-16 NOTE — Progress Notes (Signed)
Pt c/o constipation and states no bowel movements since last 03/13/21. MD paged.

## 2021-03-16 NOTE — Progress Notes (Signed)
Left upper extremity venous duplex completed. Refer to "CV Proc" under chart review to view preliminary results.  03/16/2021 10:55 AM Kelby Aline., MHA, RVT, RDCS, RDMS

## 2021-03-17 ENCOUNTER — Encounter (INDEPENDENT_AMBULATORY_CARE_PROVIDER_SITE_OTHER): Payer: Medicare Other | Admitting: Ophthalmology

## 2021-03-17 DIAGNOSIS — D62 Acute posthemorrhagic anemia: Secondary | ICD-10-CM | POA: Diagnosis not present

## 2021-03-17 DIAGNOSIS — D649 Anemia, unspecified: Secondary | ICD-10-CM | POA: Diagnosis not present

## 2021-03-17 DIAGNOSIS — I770 Arteriovenous fistula, acquired: Secondary | ICD-10-CM | POA: Diagnosis not present

## 2021-03-17 DIAGNOSIS — I120 Hypertensive chronic kidney disease with stage 5 chronic kidney disease or end stage renal disease: Secondary | ICD-10-CM | POA: Diagnosis not present

## 2021-03-17 LAB — RENAL FUNCTION PANEL
Albumin: 2.7 g/dL — ABNORMAL LOW (ref 3.5–5.0)
Anion gap: 12 (ref 5–15)
BUN: 59 mg/dL — ABNORMAL HIGH (ref 8–23)
CO2: 22 mmol/L (ref 22–32)
Calcium: 8.5 mg/dL — ABNORMAL LOW (ref 8.9–10.3)
Chloride: 94 mmol/L — ABNORMAL LOW (ref 98–111)
Creatinine, Ser: 4.16 mg/dL — ABNORMAL HIGH (ref 0.44–1.00)
GFR, Estimated: 10 mL/min — ABNORMAL LOW (ref >60)
Glucose, Bld: 220 mg/dL — ABNORMAL HIGH (ref 70–99)
Phosphorus: 3.9 mg/dL (ref 2.5–4.6)
Potassium: 2.9 mmol/L — ABNORMAL LOW (ref 3.5–5.1)
Sodium: 128 mmol/L — ABNORMAL LOW (ref 135–145)

## 2021-03-17 LAB — CBC WITH DIFFERENTIAL/PLATELET
Abs Immature Granulocytes: 0.06 10*3/uL (ref 0.00–0.07)
Basophils Absolute: 0.1 10*3/uL (ref 0.0–0.1)
Basophils Relative: 1 %
Eosinophils Absolute: 0.2 10*3/uL (ref 0.0–0.5)
Eosinophils Relative: 2 %
HCT: 25.5 % — ABNORMAL LOW (ref 36.0–46.0)
Hemoglobin: 8.5 g/dL — ABNORMAL LOW (ref 12.0–15.0)
Immature Granulocytes: 1 %
Lymphocytes Relative: 13 %
Lymphs Abs: 1.4 10*3/uL (ref 0.7–4.0)
MCH: 30.9 pg (ref 26.0–34.0)
MCHC: 33.3 g/dL (ref 30.0–36.0)
MCV: 92.7 fL (ref 80.0–100.0)
Monocytes Absolute: 0.8 10*3/uL (ref 0.1–1.0)
Monocytes Relative: 8 %
Neutro Abs: 8.2 10*3/uL — ABNORMAL HIGH (ref 1.7–7.7)
Neutrophils Relative %: 75 %
Platelets: 166 10*3/uL (ref 150–400)
RBC: 2.75 MIL/uL — ABNORMAL LOW (ref 3.87–5.11)
RDW: 16.8 % — ABNORMAL HIGH (ref 11.5–15.5)
WBC: 10.7 10*3/uL — ABNORMAL HIGH (ref 4.0–10.5)
nRBC: 0 % (ref 0.0–0.2)

## 2021-03-17 LAB — GLUCOSE, CAPILLARY
Glucose-Capillary: 165 mg/dL — ABNORMAL HIGH (ref 70–99)
Glucose-Capillary: 127 mg/dL — ABNORMAL HIGH (ref 70–99)
Glucose-Capillary: 288 mg/dL — ABNORMAL HIGH (ref 70–99)
Glucose-Capillary: 188 mg/dL — ABNORMAL HIGH (ref 70–99)

## 2021-03-17 LAB — HEMOGLOBIN AND HEMATOCRIT, BLOOD
HCT: 27 % — ABNORMAL LOW (ref 36.0–46.0)
Hemoglobin: 9.1 g/dL — ABNORMAL LOW (ref 12.0–15.0)

## 2021-03-17 LAB — HEPATITIS B SURFACE ANTIGEN: Hepatitis B Surface Ag: NONREACTIVE

## 2021-03-17 MED ORDER — PENTAFLUOROPROP-TETRAFLUOROETH EX AERO: 1.0000 "application " | INHALATION_SPRAY | CUTANEOUS | Status: DC | PRN

## 2021-03-17 MED ORDER — SODIUM CHLORIDE 0.9 % IV SOLN: 100.0000 mL | INTRAVENOUS | Status: DC | PRN

## 2021-03-17 MED ORDER — LIDOCAINE-PRILOCAINE 2.5-2.5 % EX CREA: 1.0000 "application " | TOPICAL_CREAM | CUTANEOUS | Status: DC | PRN

## 2021-03-17 MED ORDER — ALTEPLASE 2 MG IJ SOLR: 2.0000 mg | Freq: Once | INTRAMUSCULAR | Status: DC | PRN

## 2021-03-17 MED ORDER — PEG-KCL-NACL-NASULF-NA ASC-C 100 G PO SOLR: 1.0000 | Freq: Once | ORAL | Status: DC

## 2021-03-17 MED ORDER — PEG-KCL-NACL-NASULF-NA ASC-C 100 G PO SOLR
0.5000 | Freq: Once | ORAL | Status: AC
  Administered 2021-03-18: 100 g via ORAL
  Filled 2021-03-17 (×2): qty 1

## 2021-03-17 MED ORDER — PEG-KCL-NACL-NASULF-NA ASC-C 100 G PO SOLR
0.5000 | Freq: Once | ORAL | Status: AC
  Administered 2021-03-17: 100 g via ORAL
  Filled 2021-03-17: qty 1

## 2021-03-17 MED ORDER — HEPARIN SODIUM (PORCINE) 1000 UNIT/ML IJ SOLN
INTRAMUSCULAR | Status: AC
  Filled 2021-03-17: qty 4

## 2021-03-17 MED ORDER — LIDOCAINE HCL (PF) 1 % IJ SOLN: 5.0000 mL | INTRAMUSCULAR | Status: DC | PRN

## 2021-03-17 MED ORDER — HEPARIN SODIUM (PORCINE) 1000 UNIT/ML DIALYSIS: 1000.0000 [IU] | INTRAMUSCULAR | Status: DC | PRN

## 2021-03-17 MED ORDER — CHLORHEXIDINE GLUCONATE CLOTH 2 % EX PADS
6.0000 | MEDICATED_PAD | Freq: Every day | CUTANEOUS | Status: DC
  Administered 2021-03-19 – 2021-03-23 (×5): 6 via TOPICAL

## 2021-03-17 NOTE — Progress Notes (Signed)
Progress Note  Chief Complaint:   anemia      ASSESSMENT / PLAN:    80 yo female with ESRD on HD ( Th / Thur / Sat), chronic diastolic heart failure, HTN, DM2, COPD, colon polyps in March 2021. Currently admitted with symptomatic anemia, FOBT+. Baseline hgb ~ 11, admitted at 6.8.  Etiology unclear but consider PUD, intestinal AVMs, colon polyps.  --Patient dialyzed this am. Will plan for am EGD and colonoscopy. The risks and benefits of EGD with possible biopsies was discussed with the patient and they agree to proceed. --Hgb stable. It improved from 6.8 to 8.5 after 2 units of PRBC given at [facility] prior to transfer  # History of adenomatous colon pol;ys in March 2021 in [place]. She had a small sigmoid adenoma removed.  A large cecal tubular adenoma was removed piecemeal fashion . Patient didn't have recommended 6 month follow up colonoscopy due to finances. Of note. EGD on same day was reportedly normal.   # Hyponatremia ( 129) / Hypokalemia ( 2.9). These were probably pre-dialysis today. Will talk with TRH about electrolyte imbalances, especially hypokalemia since we are planning for endoscopic studies tomorrow.           SUBJECTIVE:   Feels okay. Hungry on clear liquids   OBJECTIVE:    Scheduled inpatient medications:  . aspirin EC  81 mg Oral Daily  . carvedilol  6.25 mg Oral BID WC  . heparin sodium (porcine)      . insulin aspart  0-15 Units Subcutaneous TID WC  . insulin glargine  10 Units Subcutaneous Daily  . pantoprazole (PROTONIX) IV  40 mg Intravenous Q24H  . polyethylene glycol  17 g Oral Daily   Continuous inpatient infusions:  . sodium chloride Stopped (03/17/21 0025)  . cefTRIAXone (ROCEPHIN)  IV Stopped (03/17/21 0008)   PRN inpatient medications: sodium chloride, guaiFENesin-dextromethorphan  Vital signs in last 24 hours: Temp:  [97.9 F (36.6 C)-100 F (37.8 C)] 98.5 F (36.9 C) (05/23 1322) Pulse Rate:  [62-79] 62 (05/23 1322) Resp:  [18-20]  18 (05/23 1322) BP: (124-166)/(42-74) 125/42 (05/23 1322) SpO2:  [90 %-100 %] 98 % (05/23 1322) Weight:  [73 kg-76.2 kg] 73 kg (05/23 1240) Last BM Date: 03/14/21  Intake/Output Summary (Last 24 hours) at 03/17/2021 1324 Last data filed at 03/17/2021 1240 Gross per 24 hour  Intake 1440.56 ml  Output 4053 ml  Net -2612.44 ml     Physical Exam:  . General: Alert female in NAD . Heart:  Regular rate.  No lower extremity edema . Pulmonary: Normal respiratory effort . Abdomen: Soft, nondistended, nontender. Normal bowel sounds.  . Neurologic: Alert and oriented . Psych: Pleasant. Cooperative.   Filed Weights   03/17/21 0159 03/17/21 0900 03/17/21 1240  Weight: 75.6 kg 76.2 kg 73 kg    Intake/Output from previous day: 05/22 0701 - 05/23 0700 In: 1320.6 [P.O.:1197; I.V.:23.6; IV Piggyback:100] Out: 900 [Urine:900] Intake/Output this shift: Total I/O In: 480 [P.O.:480] Out: 3153 [Urine:250; [id number]    Lab Results: Recent Labs    03/16/21 0033 03/17/21 0703 03/17/21 0844  WBC 10.7*  --  10.7*  HGB 9.4* 9.1* 8.5*  HCT 27.9* 27.0* 25.5*  PLT 187  --  166   BMET Recent Labs    03/16/21 0033 03/17/21 0903  NA 132* 128*  K 3.2* 2.9*  CL 99 94*  CO2 22 22  GLUCOSE 185* 220*  BUN 52* 59*  CREATININE 4.57* 4.16*  CALCIUM  8.6* 8.5*   LFT Recent Labs    03/17/21 0903  ALBUMIN 2.7*   PT/INR No results for input(s): LABPROT, INR in the last 72 hours. Hepatitis Panel Recent Labs    03/17/21 0954  HEPBSAG NON REACTIVE    VAS [place] UPPER EXTREMITY VENOUS DUPLEX  Result Date: 03/16/2021 UPPER VENOUS STUDY  Patient Name:  Anna Carr  Date of Exam:   03/16/2021 Medical Rec #: [id number]          Accession #:    [id number] Date of Birth: 27-Mar-1941          Patient Gender: F Patient Age:   37Y Exam Location:  [facility] Procedure:      VAS [place] UPPER EXTREMITY VENOUS DUPLEX Referring Phys: [id number] [place] T TU  --------------------------------------------------------------------------------  Indications: left upper extremity pain and edema s/p LUE AVG placement 03/12/21 Limitations: Bandages and open wound. Comparison Study: No prior study Performing Technologist: Maudry Mayhew MHA, RDMS, RVT, RDCS  Examination Guidelines: A complete evaluation includes B-mode imaging, spectral Doppler, color Doppler, and power Doppler as needed of all accessible portions of each vessel. Bilateral testing is considered an integral part of a complete examination. Limited examinations for reoccurring indications may be performed as noted.  Right Findings: +----------+------------+---------+-----------+----------+---------------------+ RIGHT     CompressiblePhasicitySpontaneousProperties       Summary        +----------+------------+---------+-----------+----------+---------------------+ Subclavian                                           Unable to visualize                                                        due to bandaging    +----------+------------+---------+-----------+----------+---------------------+  Left Findings: +----------+------------+---------+-----------+----------+--------------+ LEFT      CompressiblePhasicitySpontaneousProperties   Summary     +----------+------------+---------+-----------+----------+--------------+ IJV           Full       Yes       Yes                             +----------+------------+---------+-----------+----------+--------------+ Subclavian    Full       Yes       Yes                             +----------+------------+---------+-----------+----------+--------------+ Axillary      Full       Yes       Yes                             +----------+------------+---------+-----------+----------+--------------+ Brachial      Full       Yes       Yes                              +----------+------------+---------+-----------+----------+--------------+ Radial        Full                                                 +----------+------------+---------+-----------+----------+--------------+  Ulnar         Full                                                 +----------+------------+---------+-----------+----------+--------------+ Cephalic                                            Not visualized +----------+------------+---------+-----------+----------+--------------+ Basilic       Full                                                 +----------+------------+---------+-----------+----------+--------------+  Summary:  Left: No evidence of deep vein thrombosis in the upper extremity. No evidence of superficial vein thrombosis in the upper extremity.  *See table(s) above for measurements and observations.  Diagnosing physician: Deitra Mayo MD Electronically signed by Deitra Mayo MD on 03/16/2021 at 1:19:35 PM.    Final      Principal Problem:   Acute anemia Active Problems:   Arteriovenous fistula, acquired ([place])   Benign hypertension with CKD (chronic kidney disease) stage V ([place])   Type 2 diabetes mellitus with chronic kidney disease on chronic dialysis, with long-term current use of insulin ([place])   Chronic diastolic heart failure ([place])   Acute blood loss anemia   ESRD on hemodialysis ([place])   Elevated troponin   Cellulitis     LOS: 2 days   Tye Savoy ,NP 03/17/2021, 1:24 PM

## 2021-03-17 NOTE — Progress Notes (Signed)
Pt desats to 82% while sleeping in bed. 2L [place] PRN applied. Will continue to monitor.

## 2021-03-17 NOTE — Progress Notes (Signed)
[facility] Progress Note   80 y.o. female with ESRD on HD TTS, HTN, DM, chronic dCHF, HTN, COPD, H/x GI bleeding p/w dizziness, chest tightness, weakness and found to be anemic with Hgb 6.8 and  +FOBT. She was transfused at OSH and transferred to [facility] for dialysis needs and GI consult. Recently had conversion of left arm AVF to AVG on 5/18 by Dr. Donzetta Matters.   Dialysis Orders:  [facility] TTS 3.5h 400/500 EDW 73kg 3K/2.5Ca UPF 2 TDC  Mircera 30 q 4 weeks (last 5/17)   Assessment/ Plan:   1. GIB --+FOBT. GI has been consulted here. s/p transfusion at OSH. Hgb improved to 9.4  2. ESRD -  HD TTS. Missed Saturday d/t being in hospital.  Seen on  HD today and  then back on TTS schedule given missed treatment on Sat.  3. Dialysis access: s/p conversion LA AVF to AVG 5/18 per Dr. Donzetta Matters. Some swelling noted, monitor. Had Doppler study today.  4. Hypertension/volume  - BP elevated. Continue home meds- on amlodipine, carvedilol. Should also improve with UF on HD tomorrow.  5.  Anemia  - s/p transfusion as above. Recent ESA dose as outpatient. Will give another 30 mcg with HD Tues. Follow trends.  6. Metabolic bone disease -  Ca at goal. Continue home binders. Check Phos tomorrow 7. DMT2 - insulin per primary    Subjective:   Felt cold overnight and has dyspnea. Denies n/v/d/ CP.  Non prod cough.   Objective:   BP (!) 142/55 (BP Location: Right Arm)   Pulse 72   Temp 98 F (36.7 C) (Oral)   Resp 18   Wt 75.6 kg   SpO2 90%   BMI 30.47 kg/m   Intake/Output Summary (Last 24 hours) at 03/17/2021 0901 Last data filed at 03/17/2021 [id number] Gross per 24 hour  Intake 1560.56 ml  Output 1150 ml  Net 410.56 ml   Weight change: 0.953 kg  Physical Exam: General: WDWN woman, nad  Head: NCAT sclera not icteric MMM Lungs: Clear bilaterally. No wheeze or rales  Heart: RRR with S1 S2 Abdomen: soft non-tender  Lower extremities:without edema or ischemic changes, no open wounds  Neuro: A & O   X 3. Moves all extremities spontaneously. Psych:  Responds to questions appropriately with a normal affect. Dialysis Access: R IJ TDC;  New LUE AVG (+thrill); no erythema, drainage   Imaging: VAS [place] UPPER EXTREMITY VENOUS DUPLEX  Result Date: 03/16/2021 UPPER VENOUS STUDY  Patient Name:  SHAE-LYNN CHILA  Date of Exam:   03/16/2021 Medical Rec #: [id number]          Accession #:    [id number] Date of Birth: [birth year]          Patient Gender: F Patient Age:   76Y Exam Location:  [facility] Procedure:      VAS [place] UPPER EXTREMITY VENOUS DUPLEX Referring Phys: [id number] [place] T TU --------------------------------------------------------------------------------  Indications: left upper extremity pain and edema s/p LUE AVG placement 03/12/21 Limitations: Bandages and open wound. Comparison Study: No prior study Performing Technologist: Maudry Mayhew MHA, RDMS, RVT, RDCS  Examination Guidelines: A complete evaluation includes B-mode imaging, spectral Doppler, color Doppler, and power Doppler as needed of all accessible portions of each vessel. Bilateral testing is considered an integral part of a complete examination. Limited examinations for reoccurring indications may be performed as noted.  Right Findings: +----------+------------+---------+-----------+----------+---------------------+ RIGHT     CompressiblePhasicitySpontaneousProperties       Summary        +----------+------------+---------+-----------+----------+---------------------+  Subclavian                                           Unable to visualize                                                        due to bandaging    +----------+------------+---------+-----------+----------+---------------------+  Left Findings: +----------+------------+---------+-----------+----------+--------------+ LEFT      CompressiblePhasicitySpontaneousProperties   Summary      +----------+------------+---------+-----------+----------+--------------+ IJV           Full       Yes       Yes                             +----------+------------+---------+-----------+----------+--------------+ Subclavian    Full       Yes       Yes                             +----------+------------+---------+-----------+----------+--------------+ Axillary      Full       Yes       Yes                             +----------+------------+---------+-----------+----------+--------------+ Brachial      Full       Yes       Yes                             +----------+------------+---------+-----------+----------+--------------+ Radial        Full                                                 +----------+------------+---------+-----------+----------+--------------+ Ulnar         Full                                                 +----------+------------+---------+-----------+----------+--------------+ Cephalic                                            Not visualized +----------+------------+---------+-----------+----------+--------------+ Basilic       Full                                                 +----------+------------+---------+-----------+----------+--------------+  Summary:  Left: No evidence of deep vein thrombosis in the upper extremity. No evidence of superficial vein thrombosis in the upper extremity.  *See table(s) above for measurements and observations.  Diagnosing physician: Deitra Mayo MD Electronically signed by Deitra Mayo MD on 03/16/2021 at 1:19:35  PM.    Final     Labs: BMET Recent Labs  Lab 03/12/21 0918 03/16/21 0033  NA 131* 132*  K 3.6 3.2*  CL 96* 99  CO2  --  22  GLUCOSE 172* 185*  BUN 34* 52*  CREATININE 3.50* 4.57*  CALCIUM  --  8.6*   CBC Recent Labs  Lab 03/12/21 0918 03/12/21 0924 03/16/21 0033 03/17/21 0703  WBC  --  9.3 10.7*  --   HGB 6.8* 7.4* 9.4* 9.1*  HCT 20.0* 22.4*  27.9* 27.0*  MCV  --  95.7 92.7  --   PLT  --  193 187  --     Medications:    . aspirin EC  81 mg Oral Daily  . carvedilol  6.25 mg Oral BID WC  . insulin aspart  0-15 Units Subcutaneous TID WC  . insulin glargine  10 Units Subcutaneous Daily  . pantoprazole (PROTONIX) IV  40 mg Intravenous Q24H  . polyethylene glycol  17 g Oral Daily      Otelia Santee, MD 03/17/2021, 9:01 AM

## 2021-03-17 NOTE — H&P (View-Only) (Signed)
Progress Note  Chief Complaint:   anemia      ASSESSMENT / PLAN:    80 yo female with ESRD on HD ( Th / Thur / Sat), chronic diastolic heart failure, HTN, DM2, COPD, colon polyps in March 2021. Currently admitted with symptomatic anemia, FOBT+. Baseline hgb ~ 11, admitted at 6.8.  Etiology unclear but consider PUD, intestinal AVMs, colon polyps.  --Patient dialyzed this am. Will plan for am EGD and colonoscopy. The risks and benefits of EGD with possible biopsies was discussed with the patient and they agree to proceed. --Hgb stable. It improved from 6.8 to 8.5 after 2 units of PRBC given at [facility] prior to transfer  # History of adenomatous colon pol;ys in March 2021 in [place]. She had a small sigmoid adenoma removed.  A large cecal tubular adenoma was removed piecemeal fashion . Patient didn't have recommended 6 month follow up colonoscopy due to finances. Of note. EGD on same day was reportedly normal.   # Hyponatremia ( 129) / Hypokalemia ( 2.9). These were probably pre-dialysis today. Will talk with TRH about electrolyte imbalances, especially hypokalemia since we are planning for endoscopic studies tomorrow.           SUBJECTIVE:   Feels okay. Hungry on clear liquids   OBJECTIVE:    Scheduled inpatient medications:  . aspirin EC  81 mg Oral Daily  . carvedilol  6.25 mg Oral BID WC  . heparin sodium (porcine)      . insulin aspart  0-15 Units Subcutaneous TID WC  . insulin glargine  10 Units Subcutaneous Daily  . pantoprazole (PROTONIX) IV  40 mg Intravenous Q24H  . polyethylene glycol  17 g Oral Daily   Continuous inpatient infusions:  . sodium chloride Stopped (03/17/21 0025)  . cefTRIAXone (ROCEPHIN)  IV Stopped (03/17/21 0008)   PRN inpatient medications: sodium chloride, guaiFENesin-dextromethorphan  Vital signs in last 24 hours: Temp:  [97.9 F (36.6 C)-100 F (37.8 C)] 98.5 F (36.9 C) (05/23 1322) Pulse Rate:  [62-79] 62 (05/23 1322) Resp:  [18-20]  18 (05/23 1322) BP: (124-166)/(42-74) 125/42 (05/23 1322) SpO2:  [90 %-100 %] 98 % (05/23 1322) Weight:  [73 kg-76.2 kg] 73 kg (05/23 1240) Last BM Date: 03/14/21  Intake/Output Summary (Last 24 hours) at 03/17/2021 1324 Last data filed at 03/17/2021 1240 Gross per 24 hour  Intake 1440.56 ml  Output 4053 ml  Net -2612.44 ml     Physical Exam:  . General: Alert female in NAD . Heart:  Regular rate.  No lower extremity edema . Pulmonary: Normal respiratory effort . Abdomen: Soft, nondistended, nontender. Normal bowel sounds.  . Neurologic: Alert and oriented . Psych: Pleasant. Cooperative.   Filed Weights   03/17/21 0159 03/17/21 0900 03/17/21 1240  Weight: 75.6 kg 76.2 kg 73 kg    Intake/Output from previous day: 05/22 0701 - 05/23 0700 In: 1320.6 [P.O.:1197; I.V.:23.6; IV Piggyback:100] Out: 900 [Urine:900] Intake/Output this shift: Total I/O In: 480 [P.O.:480] Out: 3153 [Urine:250; [id number]    Lab Results: Recent Labs    03/16/21 0033 03/17/21 0703 03/17/21 0844  WBC 10.7*  --  10.7*  HGB 9.4* 9.1* 8.5*  HCT 27.9* 27.0* 25.5*  PLT 187  --  166   BMET Recent Labs    03/16/21 0033 03/17/21 0903  NA 132* 128*  K 3.2* 2.9*  CL 99 94*  CO2 22 22  GLUCOSE 185* 220*  BUN 52* 59*  CREATININE 4.57* 4.16*  CALCIUM  8.6* 8.5*   LFT Recent Labs    03/17/21 0903  ALBUMIN 2.7*   PT/INR No results for input(s): LABPROT, INR in the last 72 hours. Hepatitis Panel Recent Labs    03/17/21 0954  HEPBSAG NON REACTIVE    VAS [place] UPPER EXTREMITY VENOUS DUPLEX  Result Date: 03/16/2021 UPPER VENOUS STUDY  Patient Name:  KOI BRANDLI  Date of Exam:   03/16/2021 Medical Rec #: [id number]          Accession #:    [id number] Date of Birth: [birth year]          Patient Gender: F Patient Age:   47Y Exam Location:  [facility] Procedure:      VAS [place] UPPER EXTREMITY VENOUS DUPLEX Referring Phys: [id number] [place] T TU  --------------------------------------------------------------------------------  Indications: left upper extremity pain and edema s/p LUE AVG placement 03/12/21 Limitations: Bandages and open wound. Comparison Study: No prior study Performing Technologist: Maudry Mayhew MHA, RDMS, RVT, RDCS  Examination Guidelines: A complete evaluation includes B-mode imaging, spectral Doppler, color Doppler, and power Doppler as needed of all accessible portions of each vessel. Bilateral testing is considered an integral part of a complete examination. Limited examinations for reoccurring indications may be performed as noted.  Right Findings: +----------+------------+---------+-----------+----------+---------------------+ RIGHT     CompressiblePhasicitySpontaneousProperties       Summary        +----------+------------+---------+-----------+----------+---------------------+ Subclavian                                           Unable to visualize                                                        due to bandaging    +----------+------------+---------+-----------+----------+---------------------+  Left Findings: +----------+------------+---------+-----------+----------+--------------+ LEFT      CompressiblePhasicitySpontaneousProperties   Summary     +----------+------------+---------+-----------+----------+--------------+ IJV           Full       Yes       Yes                             +----------+------------+---------+-----------+----------+--------------+ Subclavian    Full       Yes       Yes                             +----------+------------+---------+-----------+----------+--------------+ Axillary      Full       Yes       Yes                             +----------+------------+---------+-----------+----------+--------------+ Brachial      Full       Yes       Yes                              +----------+------------+---------+-----------+----------+--------------+ Radial        Full                                                 +----------+------------+---------+-----------+----------+--------------+  Ulnar         Full                                                 +----------+------------+---------+-----------+----------+--------------+ Cephalic                                            Not visualized +----------+------------+---------+-----------+----------+--------------+ Basilic       Full                                                 +----------+------------+---------+-----------+----------+--------------+  Summary:  Left: No evidence of deep vein thrombosis in the upper extremity. No evidence of superficial vein thrombosis in the upper extremity.  *See table(s) above for measurements and observations.  Diagnosing physician: Deitra Mayo MD Electronically signed by Deitra Mayo MD on 03/16/2021 at 1:19:35 PM.    Final      Principal Problem:   Acute anemia Active Problems:   Arteriovenous fistula, acquired ([place])   Benign hypertension with CKD (chronic kidney disease) stage V ([place])   Type 2 diabetes mellitus with chronic kidney disease on chronic dialysis, with long-term current use of insulin ([place])   Chronic diastolic heart failure ([place])   Acute blood loss anemia   ESRD on hemodialysis ([place])   Elevated troponin   Cellulitis     LOS: 2 days   Tye Savoy ,NP 03/17/2021, 1:24 PM

## 2021-03-17 NOTE — Progress Notes (Signed)
PROGRESS NOTE    Anna Carr  [id number] DOB: 1941-05-22 DOA: 03/15/2021 PCP: Raina Mina., MD    No chief complaint on file.   Brief Narrative:    Anna Carr is a 80 y.o. female with medical history significant for ESRD on HD (Tu/Thurs/Sat), chronic diastolic heart failure, hypertension, COPD,  history of GI bleed requiring transfusion, insulin-dependent type 2 diabetes who presents from [facility] for dialysis and GI consult. Patient presented to [facility] yesterday on 5/20 with concerns of increasing shortness of breath, chest tightness weakness and dizziness.   On 5/18 she had revision of her left arm AV fistula. She was noted to have significant anemia with hemoglobin of 6.8. FOBT positive. She reports anemia requiring 2 units of blood transfusion in 2021 and had normal endoscopy and a colonoscopy with 2 benign polyps removed. She was advised to do repeat colonoscopy in 6 months but has been unable to follow up since she did not want to travel to [place] from [place]. She also has been having increased pain and redness to her AV graft since revision on Wednesday. Left upper extremity duplex showed No evidence of deep vein thrombosis in the upper extremity. No evidence of  superficial vein thrombosis in the upper extremity.  Assessment & Plan:   Principal Problem:   Acute anemia Active Problems:   Arteriovenous fistula, acquired ([place])   Benign hypertension with CKD (chronic kidney disease) stage V ([name])   Type 2 diabetes mellitus with chronic kidney disease on chronic dialysis, with long-term current use of insulin ([name])   Chronic diastolic heart failure ([name])   Acute blood loss anemia   ESRD on hemodialysis ([name])   Elevated troponin   Cellulitis  Anemia of blood loss:  Unclear etiology.  FOBT is positive, 2 units of PRBC transfusion and hemoglobin has improved to 8.5  GI consulted for further evaluation. She is scheduled for EGD and  colonoscopy.  Started on IV protonix.  Anemia panel showed low iron levels, low folate and vit b12 levels.     Elevated troponins/demand ischemia probably from anemia/symptomatic anemia. Patient currently denies any chest pain.    End-stage renal disease on dialysis Nephrology on board     Shortness of breath and dizziness probably secondary to missing dialysis yesterday. Volume management with hemodialysis. She reports occasional dry cough.     Left AV fistula cellulitis/thrombosis Patient had revision of AV fistula on 03/12/2021 with vascular surgery Patient was empirically started on IV Rocephin for cellulitis. Venous duplex of the left upper extremity is negative for thrombosis.      Type 2 diabetes mellitus with hyperglycemia Insulin-dependent CBG (last 3)  Recent Labs    03/16/21 1551 03/16/21 2358 03/17/21 0604  GLUCAP 254* 200* 165*   Resume SSI.     Incidental finding of groundglass opacity in the right upper lobe Follow-up with CT in about 10-year for evaluation of malignancy.    Essential hypertension BP parameters are optimal.   Chronic diastolic heart failure Fluid management with HD.   hypokalemia and hyponatremia:  Pre HD labs, abnormal from ESRD, recheck labs this evening.     DVT prophylaxis: SCD'S Code Status: full code.  Family Communication: none at bedside.  Disposition:   Status is: Inpatient  Remains inpatient appropriate because:Ongoing diagnostic testing needed not appropriate for outpatient work up and IV treatments appropriate due to intensity of illness or inability to take PO   Dispo: The patient is from: Home  Anticipated d/c is to: Home              Patient currently is not medically stable to d/c.   Difficult to place patient No       Consultants:  Nephrology Gastroenterology.   Procedures: none.  Antimicrobials:  Antibiotics Given (last 72 hours)    Date/Time Action Medication Dose Rate    03/16/21 2207 New Bag/Given   cefTRIAXone (ROCEPHIN) 1 g in sodium chloride 0.9 % 100 mL IVPB 1 g 200 mL/hr         Subjective: No chest pain or sob, no nausea, vomiting. No rectal bleeding.   Objective: Vitals:   03/17/21 1100 03/17/21 1128 03/17/21 1155 03/17/21 1240  BP: (!) 136/57 (!) 129/53 (!) 124/42 (!) 132/50  Pulse: 68 69 63   Resp:      Temp:    97.9 F (36.6 C)  TempSrc:    Oral  SpO2:    99%  Weight:    73 kg    Intake/Output Summary (Last 24 hours) at 03/17/2021 1302 Last data filed at 03/17/2021 1240 Gross per 24 hour  Intake 1440.56 ml  Output 4053 ml  Net -2612.44 ml   Filed Weights   03/17/21 0159 03/17/21 0900 03/17/21 1240  Weight: 75.6 kg 76.2 kg 73 kg    Examination:  General exam: alert and comfortable.  Respiratory system: clear to auscultation, no wheezing.  Cardiovascular system: S1 & S2 RRR, no JVD, no pedal edema.  Gastrointestinal system: Abdomen is soft, NT ND BS+ Central nervous system: Alert and oriented, non focal. Extremities: no pedal edema.  Skin: No rashes seen.  Psychiatry: . Mood is appropriate.     Data Reviewed: I have personally reviewed following labs and imaging studies  CBC: Recent Labs  Lab 03/12/21 0918 03/12/21 0924 03/16/21 0033 03/17/21 0703 03/17/21 0844  WBC  --  9.3 10.7*  --  10.7*  NEUTROABS  --   --   --   --  8.2*  HGB 6.8* 7.4* 9.4* 9.1* 8.5*  HCT 20.0* 22.4* 27.9* 27.0* 25.5*  MCV  --  95.7 92.7  --  92.7  PLT  --  193 187  --  [id number]    Basic Metabolic Panel: Recent Labs  Lab 03/12/21 0918 03/16/21 0033 03/17/21 0903  NA 131* 132* 128*  K 3.6 3.2* 2.9*  CL 96* 99 94*  CO2  --  22 22  GLUCOSE 172* 185* 220*  BUN 34* 52* 59*  CREATININE 3.50* 4.57* 4.16*  CALCIUM  --  8.6* 8.5*  PHOS  --   --  3.9    GFR: Estimated Creatinine Clearance: 10.3 mL/min (A) (by C-G formula based on SCr of 4.16 mg/dL (H)).  Liver Function Tests: Recent Labs  Lab 03/17/21 0903  ALBUMIN 2.7*     CBG: Recent Labs  Lab 03/16/21 0149 03/16/21 1129 03/16/21 1551 03/16/21 2358 03/17/21 0604  GLUCAP 141* 220* 254* 200* 165*     Recent Results (from the past 240 hour(s))  SARS CORONAVIRUS 2 (TAT 6-24 HRS) Nasopharyngeal Nasopharyngeal Swab     Status: None   Collection Time: 03/10/21 10:43 AM   Specimen: Nasopharyngeal Swab  Result Value Ref Range Status   SARS Coronavirus 2 NEGATIVE NEGATIVE Final    Comment: (NOTE) SARS-CoV-2 target nucleic acids are NOT DETECTED.  The SARS-CoV-2 RNA is generally detectable in upper and lower respiratory specimens during the acute phase of infection. Negative results do not preclude SARS-CoV-2 infection, do  not rule out co-infections with other pathogens, and should not be used as the sole basis for treatment or other patient management decisions. Negative results must be combined with clinical observations, patient history, and epidemiological information. The expected result is Negative.  Fact Sheet for Patients: SugarRoll.be  Fact Sheet for Healthcare Providers: https://www.woods-mathews.com/  This test is not yet approved or cleared by the [place] FDA and  has been authorized for detection and/or diagnosis of SARS-CoV-2 by FDA under an Emergency Use Authorization (EUA). This EUA will remain  in effect (meaning this test can be used) for the duration of the COVID-19 declaration under Se ction 564(b)(1) of the Act, 21 U.S.C. section 360bbb-3(b)(1), unless the authorization is terminated or revoked sooner.  Performed at [facility], [place] [address] [place], [place] [address]   SARS CORONAVIRUS 2 (TAT 6-24 HRS) Nasopharyngeal     Status: None   Collection Time: 03/15/21  9:53 PM   Specimen: Nasopharyngeal  Result Value Ref Range Status   SARS Coronavirus 2 NEGATIVE NEGATIVE Final    Comment: (NOTE) SARS-CoV-2 target nucleic acids are NOT DETECTED.  The SARS-CoV-2 RNA  is generally detectable in upper and lower respiratory specimens during the acute phase of infection. Negative results do not preclude SARS-CoV-2 infection, do not rule out co-infections with other pathogens, and should not be used as the sole basis for treatment or other patient management decisions. Negative results must be combined with clinical observations, patient history, and epidemiological information. The expected result is Negative.  Fact Sheet for Patients: SugarRoll.be  Fact Sheet for Healthcare Providers: https://www.woods-mathews.com/  This test is not yet approved or cleared by the [place] FDA and  has been authorized for detection and/or diagnosis of SARS-CoV-2 by FDA under an Emergency Use Authorization (EUA). This EUA will remain  in effect (meaning this test can be used) for the duration of the COVID-19 declaration under Se ction 564(b)(1) of the Act, 21 U.S.C. section 360bbb-3(b)(1), unless the authorization is terminated or revoked sooner.  Performed at [facility], [place] [address] [place], [place] [address]   MRSA PCR Screening     Status: None   Collection Time: 03/15/21 10:10 PM   Specimen: Nasopharyngeal  Result Value Ref Range Status   MRSA by PCR NEGATIVE NEGATIVE Final    Comment:        The GeneXpert MRSA Assay (FDA approved for NASAL specimens only), is one component of a comprehensive MRSA colonization surveillance program. It is not intended to diagnose MRSA infection nor to guide or monitor treatment for MRSA infections. Performed at [facility], [place] [address] [place], [place] [address]   Culture, blood (routine x 2)     Status: None (Preliminary result)   Collection Time: 03/16/21 12:33 AM   Specimen: BLOOD LEFT HAND  Result Value Ref Range Status   Specimen Description BLOOD LEFT HAND  Final   Special Requests   Final    BOTTLES DRAWN AEROBIC ONLY Blood Culture results  may not be optimal due to an inadequate volume of blood received in culture bottles   Culture   Final    NO GROWTH 1 DAY Performed at [facility], [place] [address], [place],  [address]    Report Status PENDING  Incomplete  Culture, blood (routine x 2)     Status: None (Preliminary result)   Collection Time: 03/16/21 12:46 AM   Specimen: BLOOD RIGHT HAND  Result Value Ref Range  Status   Specimen Description BLOOD RIGHT HAND  Final   Special Requests   Final    BOTTLES DRAWN AEROBIC ONLY Blood Culture results may not be optimal due to an inadequate volume of blood received in culture bottles   Culture   Final    NO GROWTH 1 DAY Performed at [facility], [place] [address], [place], [place] [address]    Report Status PENDING  Incomplete         Radiology Studies: VAS [place] UPPER EXTREMITY VENOUS DUPLEX  Result Date: 03/16/2021 UPPER VENOUS STUDY  Patient Name:  BETZABETH CAVANESS  Date of Exam:   03/16/2021 Medical Rec #: [id number]          Accession #:    [id number] Date of Birth: 1941-01-19          Patient Gender: F Patient Age:   19Y Exam Location:  [facility] Procedure:      VAS [place] UPPER EXTREMITY VENOUS DUPLEX Referring Phys: [id number] [place] T TU --------------------------------------------------------------------------------  Indications: left upper extremity pain and edema s/p LUE AVG placement 03/12/21 Limitations: Bandages and open wound. Comparison Study: No prior study Performing Technologist: Maudry Mayhew MHA, RDMS, RVT, RDCS  Examination Guidelines: A complete evaluation includes B-mode imaging, spectral Doppler, color Doppler, and power Doppler as needed of all accessible portions of each vessel. Bilateral testing is considered an integral part of a complete examination. Limited examinations for reoccurring indications may be performed as noted.  Right Findings: +----------+------------+---------+-----------+----------+---------------------+ RIGHT      CompressiblePhasicitySpontaneousProperties       Summary        +----------+------------+---------+-----------+----------+---------------------+ Subclavian                                           Unable to visualize                                                        due to bandaging    +----------+------------+---------+-----------+----------+---------------------+  Left Findings: +----------+------------+---------+-----------+----------+--------------+ LEFT      CompressiblePhasicitySpontaneousProperties   Summary     +----------+------------+---------+-----------+----------+--------------+ IJV           Full       Yes       Yes                             +----------+------------+---------+-----------+----------+--------------+ Subclavian    Full       Yes       Yes                             +----------+------------+---------+-----------+----------+--------------+ Axillary      Full       Yes       Yes                             +----------+------------+---------+-----------+----------+--------------+ Brachial      Full       Yes       Yes                             +----------+------------+---------+-----------+----------+--------------+  Radial        Full                                                 +----------+------------+---------+-----------+----------+--------------+ Ulnar         Full                                                 +----------+------------+---------+-----------+----------+--------------+ Cephalic                                            Not visualized +----------+------------+---------+-----------+----------+--------------+ Basilic       Full                                                 +----------+------------+---------+-----------+----------+--------------+  Summary:  Left: No evidence of deep vein thrombosis in the upper extremity. No evidence of superficial vein thrombosis in the upper  extremity.  *See table(s) above for measurements and observations.  Diagnosing physician: Deitra Mayo MD Electronically signed by Deitra Mayo MD on 03/16/2021 at 1:19:35 PM.    Final         Scheduled Meds: . aspirin EC  81 mg Oral Daily  . carvedilol  6.25 mg Oral BID WC  . heparin sodium (porcine)      . insulin aspart  0-15 Units Subcutaneous TID WC  . insulin glargine  10 Units Subcutaneous Daily  . pantoprazole (PROTONIX) IV  40 mg Intravenous Q24H  . polyethylene glycol  17 g Oral Daily   Continuous Infusions: . [START ON 03/18/2021] sodium chloride    . [START ON 03/18/2021] sodium chloride    . sodium chloride Stopped (03/17/21 0025)  . cefTRIAXone (ROCEPHIN)  IV Stopped (03/17/21 0008)     LOS: 2 days        Hosie Poisson, MD [facility]   To contact the attending provider between 7A-7P or the covering provider during after hours 7P-7A, please log into the web site [email or web address] and access using universal [place] password for that web site. If you do not have the password, please call the hospital operator.  03/17/2021, 1:02 PM

## 2021-03-18 ENCOUNTER — Encounter (HOSPITAL_COMMUNITY): Payer: Self-pay | Admitting: Family Medicine

## 2021-03-18 ENCOUNTER — Encounter (HOSPITAL_COMMUNITY)
Admission: AD | Disposition: A | Discharge status: Home / Self Care | Payer: Self-pay | Source: Other Acute Inpatient Hospital | Attending: Family Medicine

## 2021-03-18 ENCOUNTER — Inpatient Hospital Stay (HOSPITAL_COMMUNITY): Payer: Medicare Other | Admitting: Anesthesiology

## 2021-03-18 DIAGNOSIS — D649 Anemia, unspecified: Secondary | ICD-10-CM | POA: Diagnosis not present

## 2021-03-18 DIAGNOSIS — D500 Iron deficiency anemia secondary to blood loss (chronic): Secondary | ICD-10-CM

## 2021-03-18 DIAGNOSIS — R195 Other fecal abnormalities: Secondary | ICD-10-CM

## 2021-03-18 DIAGNOSIS — I120 Hypertensive chronic kidney disease with stage 5 chronic kidney disease or end stage renal disease: Secondary | ICD-10-CM | POA: Diagnosis not present

## 2021-03-18 DIAGNOSIS — K635 Polyp of colon: Secondary | ICD-10-CM

## 2021-03-18 DIAGNOSIS — I770 Arteriovenous fistula, acquired: Secondary | ICD-10-CM | POA: Diagnosis not present

## 2021-03-18 DIAGNOSIS — D62 Acute posthemorrhagic anemia: Secondary | ICD-10-CM | POA: Diagnosis not present

## 2021-03-18 HISTORY — PX: POLYPECTOMY: SHX5525

## 2021-03-18 HISTORY — PX: ESOPHAGOGASTRODUODENOSCOPY (EGD) WITH PROPOFOL: SHX5813

## 2021-03-18 HISTORY — PX: COLONOSCOPY WITH PROPOFOL: SHX5780

## 2021-03-18 LAB — BASIC METABOLIC PANEL
Anion gap: 11 (ref 5–15)
BUN: 27 mg/dL — ABNORMAL HIGH (ref 8–23)
CO2: 27 mmol/L (ref 22–32)
Calcium: 8.5 mg/dL — ABNORMAL LOW (ref 8.9–10.3)
Chloride: 94 mmol/L — ABNORMAL LOW (ref 98–111)
Creatinine, Ser: 3.01 mg/dL — ABNORMAL HIGH (ref 0.44–1.00)
GFR, Estimated: 15 mL/min — ABNORMAL LOW (ref >60)
Glucose, Bld: 98 mg/dL (ref 70–99)
Potassium: 3.2 mmol/L — ABNORMAL LOW (ref 3.5–5.1)
Sodium: 132 mmol/L — ABNORMAL LOW (ref 135–145)

## 2021-03-18 LAB — HEMOGLOBIN AND HEMATOCRIT, BLOOD
HCT: 26.9 % — ABNORMAL LOW (ref 36.0–46.0)
Hemoglobin: 8.7 g/dL — ABNORMAL LOW (ref 12.0–15.0)

## 2021-03-18 LAB — GLUCOSE, CAPILLARY
Glucose-Capillary: 124 mg/dL — ABNORMAL HIGH (ref 70–99)
Glucose-Capillary: 127 mg/dL — ABNORMAL HIGH (ref 70–99)
Glucose-Capillary: 102 mg/dL — ABNORMAL HIGH (ref 70–99)
Glucose-Capillary: 101 mg/dL — ABNORMAL HIGH (ref 70–99)
Glucose-Capillary: 203 mg/dL — ABNORMAL HIGH (ref 70–99)

## 2021-03-18 SURGERY — ESOPHAGOGASTRODUODENOSCOPY (EGD) WITH PROPOFOL
Anesthesia: Monitor Anesthesia Care

## 2021-03-18 MED ORDER — POTASSIUM CHLORIDE 10 MEQ/100ML IV SOLN
10.0000 meq | INTRAVENOUS | Status: DC
  Administered 2021-03-18: 10 meq via INTRAVENOUS
  Filled 2021-03-18: qty 100

## 2021-03-18 MED ORDER — LIDOCAINE 2% (20 MG/ML) 5 ML SYRINGE
INTRAMUSCULAR | Status: DC | PRN
  Administered 2021-03-18: 60 mg via INTRAVENOUS

## 2021-03-18 MED ORDER — CHLORHEXIDINE GLUCONATE CLOTH 2 % EX PADS
6.0000 | MEDICATED_PAD | Freq: Every day | CUTANEOUS | Status: DC
  Administered 2021-03-18: 6 via TOPICAL

## 2021-03-18 MED ORDER — DARBEPOETIN ALFA 40 MCG/0.4ML IJ SOSY
PREFILLED_SYRINGE | INTRAMUSCULAR | Status: AC
  Administered 2021-03-18: 40 ug via INTRAVENOUS
  Filled 2021-03-18: qty 0.4

## 2021-03-18 MED ORDER — EPHEDRINE SULFATE-NACL 50-0.9 MG/10ML-% IV SOSY
PREFILLED_SYRINGE | INTRAVENOUS | Status: DC | PRN
  Administered 2021-03-18: 7.5 mg via INTRAVENOUS

## 2021-03-18 MED ORDER — PROPOFOL 500 MG/50ML IV EMUL
INTRAVENOUS | Status: DC | PRN
  Administered 2021-03-18: 125 ug/kg/min via INTRAVENOUS

## 2021-03-18 MED ORDER — POLYETHYLENE GLYCOL 3350 17 GM/SCOOP PO POWD
0.5000 | Freq: Once | ORAL | Status: AC
  Administered 2021-03-18: 127.5 g via ORAL
  Filled 2021-03-18: qty 255

## 2021-03-18 MED ORDER — HEPARIN SODIUM (PORCINE) 1000 UNIT/ML IJ SOLN
INTRAMUSCULAR | Status: AC
  Filled 2021-03-18: qty 4

## 2021-03-18 MED ORDER — PHENYLEPHRINE HCL-NACL 10-0.9 MG/250ML-% IV SOLN
INTRAVENOUS | Status: DC | PRN
  Administered 2021-03-18: 25 ug/min via INTRAVENOUS

## 2021-03-18 MED ORDER — SODIUM CHLORIDE 0.9 % IV SOLN
INTRAVENOUS | Status: DC
  Administered 2021-03-18: 500 mL via INTRAVENOUS

## 2021-03-18 MED ORDER — PHENYLEPHRINE 40 MCG/ML (10ML) SYRINGE FOR IV PUSH (FOR BLOOD PRESSURE SUPPORT)
PREFILLED_SYRINGE | INTRAVENOUS | Status: DC | PRN
  Administered 2021-03-18: 120 ug via INTRAVENOUS
  Administered 2021-03-18: 80 ug via INTRAVENOUS

## 2021-03-18 MED ORDER — SIMETHICONE 80 MG PO CHEW
160.0000 mg | CHEWABLE_TABLET | Freq: Once | ORAL | Status: AC
  Administered 2021-03-19: 160 mg via ORAL
  Filled 2021-03-18: qty 2

## 2021-03-18 MED ORDER — DARBEPOETIN ALFA 40 MCG/0.4ML IJ SOSY: 40.0000 ug | PREFILLED_SYRINGE | INTRAMUSCULAR | Status: DC

## 2021-03-18 SURGICAL SUPPLY — 25 items

## 2021-03-18 NOTE — Anesthesia Procedure Notes (Signed)
Procedure Name: MAC Date/Time: 03/18/2021 10:15 AM Performed by: Kyung Rudd, CRNA Pre-anesthesia Checklist: Patient identified, Emergency Drugs available, Suction available and Patient being monitored Patient Re-evaluated:Patient Re-evaluated prior to induction Oxygen Delivery Method: Nasal cannula Induction Type: IV induction Placement Confirmation: positive ETCO2 Dental Injury: Teeth and Oropharynx as per pre-operative assessment

## 2021-03-18 NOTE — Op Note (Signed)
[facility] Patient Name: Anna Carr Procedure Date : 03/18/2021 MRN: [id number] Attending MD: Estill Cotta. 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 Loletha Carrow, MD, Vista Lawman, RN, Ladona Ridgel, Technician, Rhae Lerner, CRNA Referring MD:             [facility] Medicines:                Monitored Anesthesia Care Complications:            No immediate complications. Estimated Blood Loss:     Estimated blood loss was minimal. Procedure:                Pre-Anesthesia Assessment:                           - Prior to the procedure, a History and Physical                            was performed, and patient medications and                            allergies were reviewed. The patient's tolerance of                            previous anesthesia was also reviewed. The risks                            and benefits of the procedure and the sedation                            options and risks were discussed with the patient.                            All questions  were answered, and informed consent                            was obtained. Prior Anticoagulants: The patient has                            taken no previous anticoagulant or antiplatelet                            agents. ASA Grade Assessment: III - A patient with                            severe systemic disease. After reviewing the risks                            and benefits, the patient was deemed in                             satisfactory condition to undergo the procedure.                           - Prior to the procedure, a History and Physical                            was performed, and patient medications and                            allergies were reviewed. The patient's tolerance of                            previous anesthesia was also reviewed. The risks                            and benefits of the procedure and the sedation                            options and risks were discussed with the patient.                            All questions were answered, and informed consent                            was obtained. Prior Anticoagulants: The patient has                            taken no previous anticoagulant or antiplatelet                            agents. ASA Grade Assessment: III - A patient with                            severe systemic disease. After reviewing the risks  and benefits, the patient was deemed in                            satisfactory condition to undergo the procedure.                           After obtaining informed consent, the colonoscope                            was passed under direct vision. Throughout the                            procedure, the patient's blood pressure, pulse, and                            oxygen saturations were monitored continuously. The                            [id number] Olympus pediatric colonoscope                            was introduced through the anus and advanced to the                            the cecum, identified by appendiceal orifice and                            ileocecal valve. The colonoscopy was somewhat                            difficult due to a redundant colon. Successful                            completion of the procedure was aided by using                            manual pressure. The patient tolerated the                            procedure fairly  well. The quality of the bowel                            preparation was fair (extensive lavage performed).                            The ileocecal valve, appendiceal orifice, and                            rectum were photographed. The bowel preparation                            used was MoviPrep. Scope In: 10:31:20 AM Scope Out: 10:58:47 AM Scope Withdrawal Time: 0 hours 19 minutes 3 seconds  Total Procedure Duration: 0 hours 27 minutes  27 seconds  Findings:      The perianal and digital rectal examinations were normal.      Two sessile polyps were found in the transverse colon and cecum. The       polyps were diminutive in size. These polyps were removed with a cold       snare. Resection and retrieval were complete.      Many small-mouthed diverticula were found in the left colon.      The sigmoid colon was redundant.      The exam was otherwise without abnormality on direct and retroflexion       views. Impression:               - Preparation of the colon was fair.                           - Two diminutive polyps in the transverse colon and                            in the cecum, removed with a cold snare. Resected                            and retrieved.                           - Diverticulosis in the left colon.                           - Redundant colon.                           - The examination was otherwise normal on direct                            and retroflexion views. Recommendation:           - Return patient to hospital ward for ongoing care.                           - Clear liquid diet.                           - To visualize the small bowel, perform video                            capsule endoscopy tomorrow. Procedure Code(s):        --- Professional ---                           [phone], Colonoscopy, flexible; with removal of                            tumor(s), polyp(s), or other lesion(s) by snare                            technique Diagnosis  Code(s):        --- Professional ---  K63.5, Polyp of colon                           R19.5, Other fecal abnormalities                           D50.0, Iron deficiency anemia secondary to blood                            loss (chronic)                           K57.30, Diverticulosis of large intestine without                            perforation or abscess without bleeding                           Q43.8, Other specified congenital malformations of                            intestine CPT copyright 2019 American Medical Association. All rights reserved. The codes documented in this report are preliminary and upon coder review may  be revised to meet current compliance requirements. Rufus Beske L. Loletha Carrow, MD 03/18/2021 11:22:01 AM This report has been signed electronically. Number of Addenda: 0

## 2021-03-18 NOTE — Anesthesia Preprocedure Evaluation (Addendum)
Anesthesia Evaluation  Patient identified by MRN, date of birth, ID band Patient awake    Reviewed: Allergy & Precautions, NPO status , Patient's Chart, lab work & pertinent test results, reviewed documented beta blocker date and time   History of Anesthesia Complications Negative for: history of anesthetic complications  Airway Mallampati: II  TM Distance: >3 FB Neck ROM: Full    Dental  (+) Edentulous Upper, Missing,    Pulmonary COPD, former smoker,    Pulmonary exam normal        Cardiovascular hypertension, Pt. on medications and Pt. on home beta blockers +CHF  Normal cardiovascular exam  TTE 2021: normal EF, moderate TR   Neuro/Psych Depression negative neurological ROS     GI/Hepatic Neg liver ROS, GIB   Endo/Other  diabetes, Type 2, Oral Hypoglycemic Agents  Renal/GU Dialysis and ESRFRenal disease (HD T/Th/Sa)  negative genitourinary   Musculoskeletal  (+) Arthritis ,   Abdominal   Peds  Hematology  (+) anemia , Hgb 8.7   Anesthesia Other Findings Day of surgery medications reviewed with patient.  Reproductive/Obstetrics negative OB ROS                            Anesthesia Physical Anesthesia Plan  ASA: III  Anesthesia Plan: MAC   Post-op Pain Management:    Induction:   PONV Risk Score and Plan: Treatment may vary due to age or medical condition and Propofol infusion  Airway Management Planned: Natural Airway and Nasal Cannula  Additional Equipment:   Intra-op Plan:   Post-operative Plan:   Informed Consent: I have reviewed the patients History and Physical, chart, labs and discussed the procedure including the risks, benefits and alternatives for the proposed anesthesia with the patient or authorized representative who has indicated his/her understanding and acceptance.       Plan Discussed with: CRNA  Anesthesia Plan Comments:        Anesthesia  Quick Evaluation

## 2021-03-18 NOTE — Progress Notes (Addendum)
[facility] Progress Note   Subjective:   Patient seen and examined at bedside post procedure. Reports she tolerated it well.  Feels like she can not take as deep a breath as usual but does not feel SOB.  Denies CP, edema, orthopnea, vomiting and diarrhea.  Admits to nausea after eating pudding and jello.    Objective Vitals:   03/17/21 1953 03/18/21 0443 03/18/21 0800 03/18/21 0857  BP: (!) 136/43 137/66 (!) 150/57 (!) 139/54  Pulse: 65 67 73 77  Resp: '18 18 20 17  '$ Temp: 98.3 F (36.8 C) 98 F (36.7 C) 98.2 F (36.8 C) 98.1 F (36.7 C)  TempSrc: Oral Oral Oral Temporal  SpO2: 92% 99% 97% 98%  Weight:  74.3 kg     Physical Exam General:well appearing female sitting on side of bed Heart:RRR, no mrg Lungs:CTAB, nml WOB Abdomen:soft, NTND Extremities:trace LE edema Dialysis Access: R IJ TDC, new LU AVG +b   Filed Weights   03/17/21 0900 03/17/21 1240 03/18/21 0443  Weight: 76.2 kg 73 kg 74.3 kg    Intake/Output Summary (Last 24 hours) at 03/18/2021 1006 Last data filed at 03/18/2021 0957 Gross per 24 hour  Intake 4510.84 ml  Output 3003 ml  Net 1507.84 ml    Additional Objective Labs: Basic Metabolic Panel: Recent Labs  Lab 03/16/21 0033 03/17/21 0903 03/18/21 0103  NA 132* 128* 132*  K 3.2* 2.9* 3.2*  CL 99 94* 94*  CO2 '22 22 27  '$ GLUCOSE 185* 220* 98  BUN 52* 59* 27*  CREATININE 4.57* 4.16* 3.01*  CALCIUM 8.6* 8.5* 8.5*  PHOS  --  3.9  --    Liver Function Tests: Recent Labs  Lab 03/17/21 0903  ALBUMIN 2.7*   CBC: Recent Labs  Lab 03/12/21 0924 03/16/21 0033 03/17/21 0703 03/17/21 0844 03/18/21 0103  WBC 9.3 10.7*  --  10.7*  --   NEUTROABS  --   --   --  8.2*  --   HGB 7.4* 9.4* 9.1* 8.5* 8.7*  HCT 22.4* 27.9* 27.0* 25.5* 26.9*  MCV 95.7 92.7  --  92.7  --   PLT 193 187  --  166  --    CBG: Recent Labs  Lab 03/17/21 1319 03/17/21 1627 03/17/21 2029 03/18/21 0554 03/18/21 0801  GLUCAP 127* 288* 188* 124* 127*    Iron Studies:  Recent Labs    03/16/21 1444  IRON 27*  TIBC 283  FERRITIN 1,242*   Studies/Results: VAS [place] UPPER EXTREMITY VENOUS DUPLEX  Result Date: 03/16/2021 UPPER VENOUS STUDY  Patient Name:  KARIELLE RASMUSSON  Date of Exam:   03/16/2021 Medical Rec #: [id number]          Accession #:    [id number] Date of Birth: 03-06-1941          Patient Gender: F Patient Age:   36Y Exam Location:  [facility] Procedure:      VAS [place] UPPER EXTREMITY VENOUS DUPLEX Referring Phys: [id number] [place] T TU --------------------------------------------------------------------------------  Indications: left upper extremity pain and edema s/p LUE AVG placement 03/12/21 Limitations: Bandages and open wound. Comparison Study: No prior study Performing Technologist: Maudry Mayhew MHA, RDMS, RVT, RDCS  Examination Guidelines: A complete evaluation includes B-mode imaging, spectral Doppler, color Doppler, and power Doppler as needed of all accessible portions of each vessel. Bilateral testing is considered an integral part of a complete examination. Limited examinations for reoccurring indications may be performed as noted.  Right Findings: +----------+------------+---------+-----------+----------+---------------------+ RIGHT  CompressiblePhasicitySpontaneousProperties       Summary        +----------+------------+---------+-----------+----------+---------------------+ Subclavian                                           Unable to visualize                                                        due to bandaging    +----------+------------+---------+-----------+----------+---------------------+  Left Findings: +----------+------------+---------+-----------+----------+--------------+ LEFT      CompressiblePhasicitySpontaneousProperties   Summary     +----------+------------+---------+-----------+----------+--------------+ IJV           Full       Yes       Yes                              +----------+------------+---------+-----------+----------+--------------+ Subclavian    Full       Yes       Yes                             +----------+------------+---------+-----------+----------+--------------+ Axillary      Full       Yes       Yes                             +----------+------------+---------+-----------+----------+--------------+ Brachial      Full       Yes       Yes                             +----------+------------+---------+-----------+----------+--------------+ Radial        Full                                                 +----------+------------+---------+-----------+----------+--------------+ Ulnar         Full                                                 +----------+------------+---------+-----------+----------+--------------+ Cephalic                                            Not visualized +----------+------------+---------+-----------+----------+--------------+ Basilic       Full                                                 +----------+------------+---------+-----------+----------+--------------+  Summary:  Left: No evidence of deep vein thrombosis in the upper extremity. No evidence of superficial vein thrombosis in the upper extremity.  *See table(s) above for measurements and observations.  Diagnosing physician: Deitra Mayo MD Electronically signed by Deitra Mayo MD on 03/16/2021 at 1:19:35 PM.    Final     Medications: . [MAR Hold] sodium chloride Stopped (03/17/21 2300)  . sodium chloride 20 mL/hr at 03/18/21 0959  . [MAR Hold] cefTRIAXone (ROCEPHIN)  IV Stopped (03/17/21 2237)   . [MAR Hold] aspirin EC  81 mg Oral Daily  . [MAR Hold] carvedilol  6.25 mg Oral BID WC  . [MAR Hold] Chlorhexidine Gluconate Cloth  6 each Topical Q0600  . [MAR Hold] insulin aspart  0-15 Units Subcutaneous TID WC  . [MAR Hold] insulin glargine  10 Units Subcutaneous Daily  . [MAR Hold] pantoprazole  (PROTONIX) IV  40 mg Intravenous Q24H  . [MAR Hold] polyethylene glycol  17 g Oral Daily    Dialysis Orders: [place] TTS 3.5h 400/500 EDW 73kg 3K/2.5Ca UPF 2 TDC  Mircera 30 q 4 weeks (last 5/17)    Assessment/Plan: 1. GIB - Hx adenomatous colon polys - missed repeat colonscopy.  +FOBT. s/p 2 units pRBC. Hgb 8.7 today.  GI following, EGD/colonscopy this AM with no source of bleeding identified. Plan for capsule study tomorrow. On IV protonix.   2. ESRD -  HD TTS. HD yesterday off schedule due to missed treatment over the weekend.  Orders written for HD today to get back on schedule.  3. Dialysis access: s/p conversion LA AVF to AVG 5/18 per Dr. Donzetta Matters.  On Keflex for possible cellulitis.  Venous duplex with no thrombosis.  4. Hypertension/volume  - BP mostly well controlled. Continue home meds- on amlodipine, carvedilol.  Does not appear volume overloaded.  Plan for net UF goal to standing weight 72.5kg.  5.  Anemia  - s/p transfusion as above. Recent ESA dose as outpatient. Plan to give additional 30 mcg aranesp with HD today. Follow trends.  6. Metabolic bone disease -  Ca at goal. Continue home binders. Check Phos here 7. DMT2 - insulin per primary    Jen Mow, PA-C [facility] 03/18/2021,10:06 AM  LOS: 3 days   I have seen and examined this patient and agree with the plan of care. Planning on treatment today to get back on TTS regimen.  Monday tx was to make up for Saturday's missed tx. Explained to the patient and daughter who was bedside.  Dwana Melena, MD 03/18/2021, 1:36 PM

## 2021-03-18 NOTE — Progress Notes (Signed)
PROGRESS NOTE    Anna Carr  [id number] DOB: 22-Mar-1941 DOA: 03/15/2021 PCP: Raina Mina., MD    No chief complaint on file.   Brief Narrative:    Anna Carr is a 80 y.o. female with medical history significant for ESRD on HD (Tu/Thurs/Sat), chronic diastolic heart failure, hypertension, COPD,  history of GI bleed requiring transfusion, insulin-dependent type 2 diabetes who presents from [facility] for dialysis and GI consult. Patient presented to [facility] yesterday on 5/20 with concerns of increasing shortness of breath, chest tightness weakness and dizziness.   On 5/18 she had revision of her left arm AV fistula. She was noted to have significant anemia with hemoglobin of 6.8. FOBT positive. She reports anemia requiring 2 units of blood transfusion in 2021 and had normal endoscopy and a colonoscopy with 2 benign polyps removed. She was advised to do repeat colonoscopy in 6 months but has been unable to follow up since she did not want to travel to [place] from [place]. She also has been having increased pain and redness to her AV graft since revision on Wednesday. Left upper extremity duplex showed No evidence of deep vein thrombosis in the upper extremity. No evidence of  superficial vein thrombosis in the upper extremity.  Anna Carr, . She reports being nauseated, has watery diarrhea from the prep for the colonoscopy.   Assessment & Plan:   Principal Problem:   Acute anemia Active Problems:   Arteriovenous fistula, acquired ([facility])   Benign hypertension with CKD (chronic kidney disease) stage V ([name])   Type 2 diabetes mellitus with chronic kidney disease on chronic dialysis, with long-term current use of insulin ([name])   Chronic diastolic heart failure ([name])   Acute blood loss anemia   ESRD on hemodialysis ([name])   Elevated troponin   Cellulitis  Anemia of blood loss:  Unclear etiology.  FOBT is positive, 2 units of PRBC  transfusion and hemoglobin has improved to 8.5 , stabilized at 8.7. GI consulted for further evaluation. She is scheduled for EGD and colonoscopy on 03/18/21.  Started on IV protonix.  Anemia panel showed low iron levels, normal folate and vit b12 levels.  Iron supplementation on discharged.     Elevated troponins/demand ischemia probably from anemia/symptomatic anemia. Patient currently denies any chest pain.    End-stage renal disease on dialysis Nephrology on board , HD on T/TH/S    Shortness of breath and dizziness probably secondary to missing dialysis prior to admission.  Volume management with hemodialysis. She reports occasional dry cough.     Left AV fistula cellulitis/thrombosis Patient had revision of AV fistula on 03/12/2021 with vascular surgery Patient was empirically started on IV Rocephin for cellulitis, transition to keflex to complete the course.  Venous duplex of the left upper extremity is negative for thrombosis.  Mild leukocytosis, she remains afebrile.     Type 2 diabetes mellitus with hyperglycemia Insulin-dependent CBG (last 3)  Recent Labs    03/17/21 2029 03/18/21 0554 03/18/21 0801  GLUCAP 188* 124* 127*   Resume SSI.     Incidental finding of groundglass opacity in the right upper lobe Follow-up with CT in about 10-year for evaluation of malignancy.    Essential hypertension BP parameters are optimal.   Chronic diastolic heart failure Fluid management with HD.   hypokalemia and hyponatremia:  Pre HD labs, abnormal from ESRD, recheck labs this evening.  Repeat labs show potassium of 3.2 and sodium of 132.  DVT prophylaxis: SCD'S Code Status: full code.  Family Communication: none at [facility].  Disposition:   Status is: Inpatient  Remains inpatient appropriate because:Ongoing diagnostic testing needed not appropriate for outpatient work up and IV treatments appropriate due to intensity of illness or inability to take  PO   Dispo: The patient is from: Home              Anticipated d/c is to: Home              Patient currently is not medically stable to d/c.   Difficult to place patient No       Consultants:  Nephrology Gastroenterology.   Procedures: none.  Antimicrobials:  Antibiotics Given (last 72 hours)    Date/Time Action Medication Dose Rate   03/16/21 2207 New Bag/Given   cefTRIAXone (ROCEPHIN) 1 g in sodium chloride 0.9 % 100 mL IVPB 1 g 200 mL/hr   03/17/21 2207 New Bag/Given   cefTRIAXone (ROCEPHIN) 1 g in sodium chloride 0.9 % 100 mL IVPB 1 g 200 mL/hr         Subjective: No rectal bleeding, no chest pain or sob. Dry cough ,  Nauseated from th ecolon prep and no vomiting.   Objective: Vitals:   03/17/21 1900 03/17/21 1953 03/18/21 0443 03/18/21 0800  BP:  (!) 136/43 137/66 (!) 150/57  Pulse:  65 67 73  Resp: '20 18 18 20  '$ Temp:  98.3 F (36.8 C) 98 F (36.7 C) 98.2 F (36.8 C)  TempSrc:  Oral Oral Oral  SpO2: 95% 92% 99% 97%  Weight:   74.3 kg     Intake/Output Summary (Last 24 hours) at 03/18/2021 0836 Last data filed at 03/18/2021 0136 Gross per 24 hour  Intake 4510.84 ml  Output 3003 ml  Net 1507.84 ml   Filed Weights   03/17/21 0900 03/17/21 1240 03/18/21 0443  Weight: 76.2 kg 73 kg 74.3 kg    Examination:  General exam: elderly woman, on RA, does not appear to be in any distress.  Respiratory system: Clear to auscultation, no wheezing heard, diminished air entry at bases.  Cardiovascular system: S1 & S2., RRR no JVD,  Gastrointestinal system: Abdomen is soft, non tender non distended, bowel sounds wnl.  Central nervous system: Alert and oriented, non focal Extremities: No cyanosis.  Skin: skin over the the left AVF slightly tender,  Psychiatry: Mood is appropriate.     Data Reviewed: I have personally reviewed following labs and imaging studies  CBC: Recent Labs  Lab 03/12/21 0924 03/16/21 0033 03/17/21 0703 03/17/21 0844 03/18/21 0103   WBC 9.3 10.7*  --  10.7*  --   NEUTROABS  --   --   --  8.2*  --   HGB 7.4* 9.4* 9.1* 8.5* 8.7*  HCT 22.4* 27.9* 27.0* 25.5* 26.9*  MCV 95.7 92.7  --  92.7  --   PLT 193 187  --  166  --     Basic Metabolic Panel: Recent Labs  Lab 03/12/21 0918 03/16/21 0033 03/17/21 0903 03/18/21 0103  NA 131* 132* 128* 132*  K 3.6 3.2* 2.9* 3.2*  CL 96* 99 94* 94*  CO2  --  '22 22 27  '$ GLUCOSE 172* 185* 220* 98  BUN 34* 52* 59* 27*  CREATININE 3.50* 4.57* 4.16* 3.01*  CALCIUM  --  8.6* 8.5* 8.5*  PHOS  --   --  3.9  --     GFR: Estimated Creatinine Clearance: 14.3 mL/min (A) (by  C-G formula based on SCr of 3.01 mg/dL (H)).  Liver Function Tests: Recent Labs  Lab 03/17/21 0903  ALBUMIN 2.7*    CBG: Recent Labs  Lab 03/17/21 1319 03/17/21 1627 03/17/21 2029 03/18/21 0554 03/18/21 0801  GLUCAP 127* 288* 188* 124* 127*     Recent Results (from the past 240 hour(s))  SARS CORONAVIRUS 2 (TAT 6-24 HRS) Nasopharyngeal Nasopharyngeal Swab     Status: None   Collection Time: 03/10/21 10:43 AM   Specimen: Nasopharyngeal Swab  Result Value Ref Range Status   SARS Coronavirus 2 NEGATIVE NEGATIVE Final    Comment: (NOTE) SARS-CoV-2 target nucleic acids are NOT DETECTED.  The SARS-CoV-2 RNA is generally detectable in upper and lower respiratory specimens during the acute phase of infection. Negative results do not preclude SARS-CoV-2 infection, do not rule out co-infections with other pathogens, and should not be used as the sole basis for treatment or other patient management decisions. Negative results must be combined with clinical observations, patient history, and epidemiological information. The expected result is Negative.  Fact Sheet for Patients: SugarRoll.be  Fact Sheet for Healthcare Providers: https://www.woods-mathews.com/  This test is not yet approved or cleared by the [place] FDA and  has been authorized for  detection and/or diagnosis of SARS-CoV-2 by FDA under an Emergency Use Authorization (EUA). This EUA will remain  in effect (meaning this test can be used) for the duration of the COVID-19 declaration under Se ction 564(b)(1) of the Act, 21 U.S.C. section 360bbb-3(b)(1), unless the authorization is terminated or revoked sooner.  Performed at [facility], [place] [address], [place], [place] [address]   SARS CORONAVIRUS 2 (TAT 6-24 HRS) Nasopharyngeal     Status: None   Collection Time: 03/15/21  9:53 PM   Specimen: Nasopharyngeal  Result Value Ref Range Status   SARS Coronavirus 2 NEGATIVE NEGATIVE Final    Comment: (NOTE) SARS-CoV-2 target nucleic acids are NOT DETECTED.  The SARS-CoV-2 RNA is generally detectable in upper and lower respiratory specimens during the acute phase of infection. Negative results do not preclude SARS-CoV-2 infection, do not rule out co-infections with other pathogens, and should not be used as the sole basis for treatment or other patient management decisions. Negative results must be combined with clinical observations, patient history, and epidemiological information. The expected result is Negative.  Fact Sheet for Patients: SugarRoll.be  Fact Sheet for Healthcare Providers: https://www.woods-mathews.com/  This test is not yet approved or cleared by the [place] FDA and  has been authorized for detection and/or diagnosis of SARS-CoV-2 by FDA under an Emergency Use Authorization (EUA). This EUA will remain  in effect (meaning this test can be used) for the duration of the COVID-19 declaration under Se ction 564(b)(1) of the Act, 21 U.S.C. section 360bbb-3(b)(1), unless the authorization is terminated or revoked sooner.  Performed at [facility], [place] [address], [place], [place] [address]   MRSA PCR Screening     Status: None   Collection Time: 03/15/21 10:10 PM   Specimen:  Nasopharyngeal  Result Value Ref Range Status   MRSA by PCR NEGATIVE NEGATIVE Final    Comment:        The GeneXpert MRSA Assay (FDA approved for NASAL specimens only), is one component of a comprehensive MRSA colonization surveillance program. It is not intended to diagnose MRSA infection nor to guide or monitor treatment for MRSA infections. Performed at [facility], [place] [address], [place], [place] [address]  Culture, blood (routine x 2)     Status: None (Preliminary result)   Collection Time: 03/16/21 12:33 AM   Specimen: BLOOD LEFT HAND  Result Value Ref Range Status   Specimen Description BLOOD LEFT HAND  Final   Special Requests   Final    BOTTLES DRAWN AEROBIC ONLY Blood Culture results may not be optimal due to an inadequate volume of blood received in culture bottles   Culture   Final    NO GROWTH 2 DAYS Performed at [facility], [place] [address], [place], [place] [address]    Report Status PENDING  Incomplete  Culture, blood (routine x 2)     Status: None (Preliminary result)   Collection Time: 03/16/21 12:46 AM   Specimen: BLOOD RIGHT HAND  Result Value Ref Range Status   Specimen Description BLOOD RIGHT HAND  Final   Special Requests   Final    BOTTLES DRAWN AEROBIC ONLY Blood Culture results may not be optimal due to an inadequate volume of blood received in culture bottles   Culture   Final    NO GROWTH 2 DAYS Performed at [facility], [place] [address], [place], [place] [address]    Report Status PENDING  Incomplete         Radiology Studies: VAS [place] UPPER EXTREMITY VENOUS DUPLEX  Result Date: 03/16/2021 UPPER VENOUS STUDY  Patient Name:  JANIAYA BENCOMO  Date of Exam:   03/16/2021 Medical Rec #: [id number]          Accession #:    [id number] Date of Birth: November 24, 1940          Patient Gender: F Patient Age:   83Y Exam Location:  [facility] Procedure:      VAS [place] UPPER EXTREMITY VENOUS DUPLEX Referring Phys: [id number] [place] T TU  --------------------------------------------------------------------------------  Indications: left upper extremity pain and edema s/p LUE AVG placement 03/12/21 Limitations: Bandages and open wound. Comparison Study: No prior study Performing Technologist: Maudry Mayhew MHA, RDMS, RVT, RDCS  Examination Guidelines: A complete evaluation includes B-mode imaging, spectral Doppler, color Doppler, and power Doppler as needed of all accessible portions of each vessel. Bilateral testing is considered an integral part of a complete examination. Limited examinations for reoccurring indications may be performed as noted.  Right Findings: +----------+------------+---------+-----------+----------+---------------------+ RIGHT     CompressiblePhasicitySpontaneousProperties       Summary        +----------+------------+---------+-----------+----------+---------------------+ Subclavian                                           Unable to visualize                                                        due to bandaging    +----------+------------+---------+-----------+----------+---------------------+  Left Findings: +----------+------------+---------+-----------+----------+--------------+ LEFT      CompressiblePhasicitySpontaneousProperties   Summary     +----------+------------+---------+-----------+----------+--------------+ IJV           Full       Yes       Yes                             +----------+------------+---------+-----------+----------+--------------+  Subclavian    Full       Yes       Yes                             +----------+------------+---------+-----------+----------+--------------+ Axillary      Full       Yes       Yes                             +----------+------------+---------+-----------+----------+--------------+ Brachial      Full       Yes       Yes                              +----------+------------+---------+-----------+----------+--------------+ Radial        Full                                                 +----------+------------+---------+-----------+----------+--------------+ Ulnar         Full                                                 +----------+------------+---------+-----------+----------+--------------+ Cephalic                                            Not visualized +----------+------------+---------+-----------+----------+--------------+ Basilic       Full                                                 +----------+------------+---------+-----------+----------+--------------+  Summary:  Left: No evidence of deep vein thrombosis in the upper extremity. No evidence of superficial vein thrombosis in the upper extremity.  *See table(s) above for measurements and observations.  Diagnosing physician: Deitra Mayo MD Electronically signed by Deitra Mayo MD on 03/16/2021 at 1:19:35 PM.    Final         Scheduled Meds: . aspirin EC  81 mg Oral Daily  . carvedilol  6.25 mg Oral BID WC  . Chlorhexidine Gluconate Cloth  6 each Topical Q0600  . insulin aspart  0-15 Units Subcutaneous TID WC  . insulin glargine  10 Units Subcutaneous Daily  . pantoprazole (PROTONIX) IV  40 mg Intravenous Q24H  . polyethylene glycol  17 g Oral Daily   Continuous Infusions: . sodium chloride Stopped (03/17/21 2300)  . cefTRIAXone (ROCEPHIN)  IV Stopped (03/17/21 2237)  . potassium chloride Stopped (03/18/21 0754)     LOS: 3 days        Hosie Poisson, MD [facility]   To contact the attending provider between 7A-7P or the covering provider during after hours 7P-7A, please log into the web site [email or web address] and access using universal [place] password for that web site. If you do not have the password, please call the hospital operator.  03/18/2021, 8:36 AM

## 2021-03-18 NOTE — Interval H&P Note (Signed)
History and Physical Interval Note:  03/18/2021 10:07 AM  Anna Carr  has presented today for surgery, with the diagnosis of anemia and hemoccult positive stool.  The various methods of treatment have been discussed with the patient and family. After consideration of risks, benefits and other options for treatment, the patient has consented to  Procedure(s): ESOPHAGOGASTRODUODENOSCOPY (EGD) WITH PROPOFOL (N/A) COLONOSCOPY WITH PROPOFOL (N/A) as a surgical intervention.  The patient's history has been reviewed, patient examined, no change in status, stable for surgery.  I have reviewed the patient's chart and labs.  Questions were answered to the patient's satisfaction.     [name]

## 2021-03-18 NOTE — Progress Notes (Signed)
EGD and colonoscopy unrevealing for source of anemia.  Patient scheduled for small bowel video capsule study in the morning.  Clear liquid diet today, n.p.o. after midnight . She will get one half  MiraLAX bowel prep this evening.  I called dialysis to let them know we were planning on capsule study in the a.m and asked her dialysis could be done tomorrow afternoon.   They are actually going to dialyze her this afternoon

## 2021-03-18 NOTE — Op Note (Signed)
[facility] Patient Name: Anna Carr Procedure Date : 03/18/2021 MRN: [id number] Attending MD: Estill Cotta. Loletha Carrow , MD Date of Birth: 1941-09-09 CSN: [id number] Age: 80 Admit Type: Inpatient Procedure:                Upper GI endoscopy Indications:              Iron deficiency anemia secondary to chronic blood                            loss, Heme positive stool                           Acute on chronic anemia, ESRD on HD Providers:                Mallie Mussel L. Loletha Carrow, MD, Vista Lawman, RN, Ladona Ridgel, Technician, Rhae Lerner, CRNA Referring MD:             [facility] Medicines:                Monitored Anesthesia Care Complications:            No immediate complications. Estimated Blood Loss:     Estimated blood loss: none. Procedure:                Pre-Anesthesia Assessment:                           - Prior to the procedure, a History and Physical                            was performed, and patient medications and                            allergies were reviewed. The patient's tolerance of                            previous anesthesia was also reviewed. The risks                            and benefits of the procedure and the sedation                            options and risks were discussed with the patient.                            All questions were answered, and informed consent                            was obtained. Prior Anticoagulants: The patient has                            taken no previous anticoagulant or antiplatelet  agents. ASA Grade Assessment: III - A patient with                            severe systemic disease. After reviewing the risks                            and benefits, the patient was deemed in                            satisfactory condition to undergo the procedure.                           After obtaining informed consent, the endoscope was                             passed under direct vision. Throughout the                            procedure, the patient's blood pressure, pulse, and                            oxygen saturations were monitored continuously. The                            [id number]-[id number]) Olympus gastroscope was                            introduced through the mouth, and advanced to the                            third part of duodenum. The upper GI endoscopy was                            accomplished without difficulty. The patient                            tolerated the procedure fairly well. Scope In: Scope Out: Findings:      The larynx was normal.      The esophagus was normal.      The stomach was normal.      The cardia and gastric fundus were normal on retroflexion.      The examined duodenum was normal. Impression:               - Normal larynx.                           - Normal esophagus.                           - Normal stomach.                           - Normal examined duodenum.                           -  No specimens collected. Recommendation:           - Return patient to hospital ward for ongoing care.                           - Clear liquid diet.                           - To visualize the small bowel, perform video                            capsule endoscopy tomorrow. Needs more bowel prep                            and coordination with HD. Procedure Code(s):        --- Professional ---                           [phone], Esophagogastroduodenoscopy, flexible,                            transoral; diagnostic, including collection of                            specimen(s) by brushing or washing, when performed                            (separate procedure) Diagnosis Code(s):        --- Professional ---                           D50.0, Iron deficiency anemia secondary to blood                            loss (chronic)                           R19.5, Other fecal abnormalities CPT copyright  2019 American Medical Association. All rights reserved. The codes documented in this report are preliminary and upon coder review may  be revised to meet current compliance requirements. Giulia Hickey L. Loletha Carrow, MD 03/18/2021 11:16:05 AM This report has been signed electronically. Number of Addenda: 0

## 2021-03-18 NOTE — Progress Notes (Signed)
Pt completed bowel prep early this morning. Stools are completely liquid, but still very dark brown/black. Pt's daughter states that the pt's binders (for dialysis) have been making her stools very dark.

## 2021-03-18 NOTE — Transfer of Care (Signed)
Immediate Anesthesia Transfer of Care Note  Patient: ARLEY GARANT  Procedure(s) Performed: ESOPHAGOGASTRODUODENOSCOPY (EGD) WITH PROPOFOL (N/A ) COLONOSCOPY WITH PROPOFOL (N/A ) POLYPECTOMY  Patient Location: Endoscopy Unit  Anesthesia Type:MAC  Level of Consciousness: awake, alert  and oriented  Airway & Oxygen Therapy: Patient Spontanous Breathing and Patient connected to nasal cannula oxygen  Post-op Assessment: Report given to RN, Post -op Vital signs reviewed and stable and Patient moving all extremities  Post vital signs: Reviewed and stable  Last Vitals:  Vitals Value Taken Time  BP 124/37 03/18/21 1110  Temp    Pulse 63 03/18/21 1112  Resp 22 03/18/21 1112  SpO2 100 % 03/18/21 1112  Vitals shown include unvalidated device data.  Last Pain:  Vitals:   03/18/21 1109  TempSrc:   PainSc: 0-No pain         Complications: No complications documented.

## 2021-03-19 ENCOUNTER — Encounter (HOSPITAL_COMMUNITY)
Admission: AD | Disposition: A | Discharge status: Home / Self Care | Payer: Self-pay | Source: Other Acute Inpatient Hospital | Attending: Family Medicine

## 2021-03-19 ENCOUNTER — Encounter (HOSPITAL_COMMUNITY): Payer: Self-pay | Admitting: Family Medicine

## 2021-03-19 DIAGNOSIS — K5520 Angiodysplasia of colon without hemorrhage: Secondary | ICD-10-CM

## 2021-03-19 DIAGNOSIS — K921 Melena: Secondary | ICD-10-CM

## 2021-03-19 DIAGNOSIS — D649 Anemia, unspecified: Secondary | ICD-10-CM | POA: Diagnosis not present

## 2021-03-19 HISTORY — PX: GIVENS CAPSULE STUDY: SHX5432

## 2021-03-19 LAB — HEMOGLOBIN AND HEMATOCRIT, BLOOD
HCT: 24 % — ABNORMAL LOW (ref 36.0–46.0)
Hemoglobin: 8 g/dL — ABNORMAL LOW (ref 12.0–15.0)

## 2021-03-19 LAB — GLUCOSE, CAPILLARY
Glucose-Capillary: 117 mg/dL — ABNORMAL HIGH (ref 70–99)
Glucose-Capillary: 129 mg/dL — ABNORMAL HIGH (ref 70–99)
Glucose-Capillary: 152 mg/dL — ABNORMAL HIGH (ref 70–99)

## 2021-03-19 LAB — SURGICAL PATHOLOGY

## 2021-03-19 SURGERY — IMAGING PROCEDURE, GI TRACT, INTRALUMINAL, VIA CAPSULE
Anesthesia: LOCAL

## 2021-03-19 MED ORDER — FERRIC CITRATE 1 GM 210 MG(FE) PO TABS
210.0000 mg | ORAL_TABLET | Freq: Three times a day (TID) | ORAL | Status: DC
  Administered 2021-03-19 – 2021-03-23 (×9): 210 mg via ORAL
  Filled 2021-03-19 (×10): qty 1

## 2021-03-19 MED ORDER — CHLORHEXIDINE GLUCONATE CLOTH 2 % EX PADS: 6.0000 | MEDICATED_PAD | Freq: Every day | CUTANEOUS | Status: DC

## 2021-03-19 SURGICAL SUPPLY — 1 items: TOWEL COTTON PACK 4EA (MISCELLANEOUS) ×4 IMPLANT

## 2021-03-19 NOTE — Progress Notes (Addendum)
[facility] Progress Note   Subjective:   Patient seen and examined in room.  Undergoing capsule study today.  Reports minor abdominal discomfort.  Denies CP, SOB, n/v, weakness and fatigue.  Liquid dark brown/maroonish stool this AM.   Objective Vitals:   03/18/21 2025 03/18/21 2300 03/19/21 0300 03/19/21 0319  BP: (!) 178/51 (!) 152/60  (!) 154/51  Pulse: 68   76  Resp: 14   16  Temp: 97.9 F (36.6 C)   99.6 F (37.6 C)  TempSrc: Oral   Oral  SpO2: 97%   97%  Weight:   72.7 kg    Physical Exam General:well appearing female in NAD, sitting on side of bed Heart:RRR, no mrg Lungs:CTAB, nml WOB on RA Abdomen:soft, NTND Extremities:trace LE edema Dialysis Access: R IJ TDC, new LU AVG +b   Filed Weights   03/18/21 1345 03/18/21 1730 03/19/21 0300  Weight: 75.5 kg 72 kg 72.7 kg    Intake/Output Summary (Last 24 hours) at 03/19/2021 0902 Last data filed at 03/19/2021 0526 Gross per 24 hour  Intake 1121 ml  Output 3850 ml  Net -2729 ml    Additional Objective Labs: Basic Metabolic Panel: Recent Labs  Lab 03/16/21 0033 03/17/21 0903 03/18/21 0103  NA 132* 128* 132*  K 3.2* 2.9* 3.2*  CL 99 94* 94*  CO2 '22 22 27  '$ GLUCOSE 185* 220* 98  BUN 52* 59* 27*  CREATININE 4.57* 4.16* 3.01*  CALCIUM 8.6* 8.5* 8.5*  PHOS  --  3.9  --    Liver Function Tests: Recent Labs  Lab 03/17/21 0903  ALBUMIN 2.7*   CBC: Recent Labs  Lab 03/12/21 0924 03/16/21 0033 03/17/21 0703 03/17/21 0844 03/18/21 0103 03/19/21 0214  WBC 9.3 10.7*  --  10.7*  --   --   NEUTROABS  --   --   --  8.2*  --   --   HGB 7.4* 9.4*   < > 8.5* 8.7* 8.0*  HCT 22.4* 27.9*   < > 25.5* 26.9* 24.0*  MCV 95.7 92.7  --  92.7  --   --   PLT 193 187  --  166  --   --    < > = values in this interval not displayed.   CBG: Recent Labs  Lab 03/18/21 0801 03/18/21 1227 03/18/21 1821 03/18/21 2153 03/19/21 0600  GLUCAP 127* 102* 101* 203* 117*   Iron Studies:  Recent Labs     03/16/21 1444  IRON 27*  TIBC 283  FERRITIN 1,242*   Studies/Results: No results found.  Medications: . sodium chloride Stopped (03/17/21 2300)  . cefTRIAXone (ROCEPHIN)  IV 1 g (03/18/21 2157)   . aspirin EC  81 mg Oral Daily  . carvedilol  6.25 mg Oral BID WC  . Chlorhexidine Gluconate Cloth  6 each Topical Q0600  . Chlorhexidine Gluconate Cloth  6 each Topical Q0600  . darbepoetin (ARANESP) injection - DIALYSIS  40 mcg Intravenous Q Tue-HD  . insulin aspart  0-15 Units Subcutaneous TID WC  . insulin glargine  10 Units Subcutaneous Daily  . pantoprazole (PROTONIX) IV  40 mg Intravenous Q24H  . polyethylene glycol  17 g Oral Daily    Dialysis Orders: [place] TTS 3.5h 400/500 EDW 73kg 3K/2.5Ca UPF 2 TDC  Mircera 30 q 4 weeks (last 5/17)   Assessment/Plan: 1. GIB - Hx adenomatous colon polys - missed repeat colonscopy.  +FOBT. s/p 2 units pRBC. Hgb 8.0 today.  Ongoing dark/maroon stool.  GI following, EGD/colonscopy 5/24 with no source of bleeding identified. Capsule study initiated this AM.  2. ESRD -HD TTS. HD yesterday tolerated well.  Next HD 5/26.   3. Dialysis access: s/p conversion LA AVF to AVG 5/18 per Dr. Donzetta Matters.  Finished rocephin. Venous duplex with no thrombosis.  4. Hypertension/volume - BP mildly elevated this AM, no meds yet d/t procedure. Continue home meds- on amlodipine, carvedilol.  Does not appear volume overloaded.  Under dry weight post HD yesterday, edema improved.  May need to lower dry. Continue UF as tolerated. 5. Anemia - s/p transfusion as above. Recent ESA dose as outpatient. Plan to give additional 30 mcg aranesp with HD yesterday. Follow trends. 6. Metabolic bone disease -Ca and phos in goal.  Binders held d/t melena. Follow labs.  7. DMT2 - insulin per primary 8. Nutrition - renal diet w/fluid restrictions when advanced.    Jen Mow, PA-C [facility] 03/19/2021,9:02 AM  LOS: 4 days   I have seen and examined  this patient and agree with the plan of care. Capsule study today. Next HD Thur. Good thrill in LUA bypass AVG.  Dwana Melena, MD 03/19/2021, 10:08 AM

## 2021-03-19 NOTE — Progress Notes (Signed)
PROGRESS NOTE   Anna Carr  [id number] DOB: April 07, 1941 DOA: 03/15/2021 PCP: Raina Mina., MD  Brief Narrative:  80 year old community dwelling female ESRD HD TTS ([place])-recent left arm AV fistula revision Dr. Donzetta Matters 03/12/2021----documented?  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 Weston Anna  Presented from [facility] 5/20+ SOB + CP + dizzy-dizziness did not resolve At [facility] had CP/troponin bump 0.36-->0.7 secondary to?  ESRD/symptomatic anemia which seem to resolve with transfusion blood Her cardiologist recommended continuation aspirin  Found to be anemic on initial work-up Prior adenomatous colon polyps 01/13/2020 ([place])-large cecal adenoma piecemeal removed-did not follow-up for recommended colonoscopy  GI consulted-colonoscopy showed diverticulosis redundant colon and 2 diminutive polyps in transverse colon status post removal  Givens study planned for 5/25  Hospital-Problem based course SOB/dizzy with anemia on admission Multifactorial reasoning for dizziness Appreciate nephrology input Patient seemed improved with blood transfusion ESRD TTS [place] [facility] AV fistula revision Dr. Donzetta Matters 03/12/2021 SIADH Sodium improving with reinitiation of dialysis Aranesp/binders etc. as per nephrology (once melena/bleeding sorted out)--might nee dIV iron prior to d/c? Continue to challenge EDW as per Dr. Augustin Coupe AV fistula appears benign Rocephin ordered from 5/21 and discontinued 5/25-ultrasound of the area of fistula ruled out thrombosis Symptomatic anemia, FOBT  + Transfused 2 units PRBC EGD colonoscopy 5/24 no overt source bleed Undergoing capsule study today 5/25 Still having maroon dark stool with clear liquid diet Resume Auryxia-as an outpatient resume B12 Elevated troponin on admission Not felt to be cardiac but due to ESRD/anemia, no heparinization required Continues aspirin Coreg 6.25 Hyperglycemia predominant DM  TY 2 CBGs ranging 1 29-2 03 Continue Lantus 10 units and sliding scale Home dose is 12 units Hypokalemia Routine labs a.m.    DVT prophylaxis: scd Code Status: full Family Communication: none today Disposition:  Status is: Inpatient  Remains inpatient appropriate because:Hemodynamically unstable, Persistent severe electrolyte disturbances and Altered mental status   Dispo: The patient is from: Home              Anticipated d/c is to: SNF              Patient currently is not medically stable to d/c.   Difficult to place patient No       Consultants:   GI  Nephro    Procedures:  Endo/COlo 5/24  Antimicrobials:  Ceftriaxone discontinued 5/25   Subjective: Awake alert still having maroon dark watery stools No chest pain no dizziness No fever Awaiting report of capsule study  Objective: Vitals:   03/18/21 2300 03/19/21 0300 03/19/21 0319 03/19/21 1205  BP: (!) 152/60  (!) 154/51 (!) 147/49  Pulse:   76 62  Resp:   16 16  Temp:   99.6 F (37.6 C) 97.6 F (36.4 C)  TempSrc:   Oral Oral  SpO2:   97% 99%  Weight:  72.7 kg      Intake/Output Summary (Last 24 hours) at 03/19/2021 1307 Last data filed at 03/19/2021 0526 Gross per 24 hour  Intake 500 ml  Output 3650 ml  Net -3150 ml   Filed Weights   03/18/21 1345 03/18/21 1730 03/19/21 0300  Weight: 75.5 kg 72 kg 72.7 kg    Examination:  Awake coherent flat affect no distress EOMI NCAT Abdomen soft Chest clear S1-S2 no murmur NSR ?  15-20 beats SVT? Abdomen soft no rebound no guarding  Data Reviewed: personally reviewed   CBC    Component Value  Date/Time   WBC 10.7 (H) 03/17/2021 0844   RBC 2.75 (L) 03/17/2021 0844   HGB 8.0 (L) 03/19/2021 0214   HCT 24.0 (L) 03/19/2021 0214   PLT 166 03/17/2021 0844   MCV 92.7 03/17/2021 0844   MCH 30.9 03/17/2021 0844   MCHC 33.3 03/17/2021 0844   RDW 16.8 (H) 03/17/2021 0844   LYMPHSABS 1.4 03/17/2021 0844   MONOABS 0.8 03/17/2021 0844   EOSABS  0.2 03/17/2021 0844   BASOSABS 0.1 03/17/2021 0844   CMP Latest Ref Rng & Units 03/18/2021 03/17/2021 03/16/2021  Glucose 70 - 99 mg/dL 98 220(H) 185(H)  BUN 8 - 23 mg/dL 27(H) 59(H) 52(H)  Creatinine 0.44 - 1.00 mg/dL 3.01(H) 4.16(H) 4.57(H)  Sodium 135 - 145 mmol/L 132(L) 128(L) 132(L)  Potassium 3.5 - 5.1 mmol/L 3.2(L) 2.9(L) 3.2(L)  Chloride 98 - 111 mmol/L 94(L) 94(L) 99  CO2 22 - 32 mmol/L '27 22 22  '$ Calcium 8.9 - 10.3 mg/dL 8.5(L) 8.5(L) 8.6(L)     Radiology Studies: No results found.   Scheduled Meds: . aspirin EC  81 mg Oral Daily  . carvedilol  6.25 mg Oral BID WC  . Chlorhexidine Gluconate Cloth  6 each Topical Q0600  . darbepoetin (ARANESP) injection - DIALYSIS  40 mcg Intravenous Q Tue-HD  . ferric citrate  210 mg Oral TID WC  . insulin aspart  0-15 Units Subcutaneous TID WC  . insulin glargine  10 Units Subcutaneous Daily  . pantoprazole (PROTONIX) IV  40 mg Intravenous Q24H  . polyethylene glycol  17 g Oral Daily   Continuous Infusions: . sodium chloride Stopped (03/17/21 2300)     LOS: 4 days   Time spent: 25  Nita Sells, MD [facility] To contact the attending provider between 7A-7P or the covering provider during after hours 7P-7A, please log into the web site [email or web address] and access using universal [place] password for that web site. If you do not have the password, please call the hospital operator.  03/19/2021, 1:07 PM

## 2021-03-19 NOTE — Progress Notes (Addendum)
Daily Rounding Note  03/19/2021, 2:47 PM  LOS: 4 days   SUBJECTIVE:   Chief complaint: FOBT + anemia.     Stools watery and dark.  Feels tired.    OBJECTIVE:         Vital signs in last 24 hours:    Temp:  [97.6 F (36.4 C)-99.6 F (37.6 C)] 97.6 F (36.4 C) (05/25 1205) Pulse Rate:  [53-76] 62 (05/25 1205) Resp:  [14-18] 16 (05/25 1205) BP: (135-178)/(49-96) 147/49 (05/25 1205) SpO2:  [94 %-99 %] 99 % (05/25 1205) Weight:  [72 kg-72.7 kg] 72.7 kg (05/25 0300) Last BM Date: 03/18/21 Filed Weights   03/18/21 1345 03/18/21 1730 03/19/21 0300  Weight: 75.5 kg 72 kg 72.7 kg   General: looks pale and a bit tired but not acutely ill looking.   VCE apparatus in place.   Spoke w pt and her dtr, did not re-examine pt other than visually.      Intake/Output from previous day: 05/24 0701 - 05/25 0700 In: 1121 [P.O.:971; I.V.:150] Out: 3850 [Urine:500; Stool:350]  Intake/Output this shift: No intake/output data recorded.  Lab Results: Recent Labs    03/17/21 0844 03/18/21 0103 03/19/21 0214  WBC 10.7*  --   --   HGB 8.5* 8.7* 8.0*  HCT 25.5* 26.9* 24.0*  PLT 166  --   --    BMET Recent Labs    03/17/21 0903 03/18/21 0103  NA 128* 132*  K 2.9* 3.2*  CL 94* 94*  CO2 22 27  GLUCOSE 220* 98  BUN 59* 27*  CREATININE 4.16* 3.01*  CALCIUM 8.5* 8.5*   LFT Recent Labs    03/17/21 0903  ALBUMIN 2.7*   PT/INR No results for input(s): LABPROT, INR in the last 72 hours. Hepatitis Panel Recent Labs    03/17/21 0954  HEPBSAG NON REACTIVE    Studies/Results: No results found.   Scheduled Meds: . aspirin EC  81 mg Oral Daily  . carvedilol  6.25 mg Oral BID WC  . Chlorhexidine Gluconate Cloth  6 each Topical Q0600  . darbepoetin (ARANESP) injection - DIALYSIS  40 mcg Intravenous Q Tue-HD  . ferric citrate  210 mg Oral TID WC  . insulin aspart  0-15 Units Subcutaneous TID WC  . insulin glargine  10  Units Subcutaneous Daily  . pantoprazole (PROTONIX) IV  40 mg Intravenous Q24H  . polyethylene glycol  17 g Oral Daily   Continuous Infusions: . sodium chloride Stopped (03/17/21 2300)   PRN Meds:.sodium chloride, guaiFENesin-dextromethorphan   ASSESMENT:   *  IDA.  FOBT +.   Transfused 2 PRBCs for Hgb 6.8 PTA.  No transfusions at [facility] 01/14/2020 colonoscopy in [place].  Piecemeal removal of large cecal TA.  Small sigmoid adenoma. 01/14/2020 EGD.  In [place].  Grossly normal and biopsies of stomach and duodenum normal. 03/18/21 Colonoscopy: Cold snare removal of 2 diminutive polyps (1 TA w/o HGD.  1 small serrated polyp can not distinguish between SSP and HP type) from transverse and cecum.  Left colon diverticulosis.  Redundant colon. 03/18/2021 EGD: Normal to examined duodenum. 03/19/21 VCE, in progress.   Hgb today is 8.    *   ESRD.  TTS HD.     PLAN   *   Complete VCE later today, images to be read tomorrow 5/26. Advancing diet step by step.  Back to renal/carb mod by tonight at 1640.      [name]  03/19/2021, 2:47 PM Phone [phone]  I have discussed the case with the APP, and that is the plan I formulated. I personally interviewed and examined the patient.  Hemoglobin dropped from 8.7 yesterday morning to 8.0 this morning.  She has not had any reported melena since admission. She is in the midst of video capsule study which will be completed later this afternoon.  I discussed the overall situation with her and her daughter, who was present at the bedside. I will read the video capsule study tomorrow and see if there is any identifiable source of small bowel blood loss. I also discussed the case with Dr. Verlon Au of [place] [facility] Office: [phone]

## 2021-03-19 NOTE — Progress Notes (Signed)
Patient ingested Givens capsule at 0740 per order of Dr. Loletha Carrow. Instructions provided to patient. Patient expressed understanding.

## 2021-03-19 NOTE — Anesthesia Postprocedure Evaluation (Signed)
Anesthesia Post Note  Patient: Anna Carr  Procedure(s) Performed: ESOPHAGOGASTRODUODENOSCOPY (EGD) WITH PROPOFOL (N/A ) COLONOSCOPY WITH PROPOFOL (N/A ) POLYPECTOMY     Patient location during evaluation: PACU Anesthesia Type: MAC Level of consciousness: awake and alert and oriented Pain management: pain level controlled Vital Signs Assessment: post-procedure vital signs reviewed and stable Respiratory status: spontaneous breathing, nonlabored ventilation and respiratory function stable Cardiovascular status: blood pressure returned to baseline Postop Assessment: no apparent nausea or vomiting Anesthetic complications: no   No complications documented.           [name]

## 2021-03-19 NOTE — Hospital Course (Signed)
80 year old community dwelling female ESRD HD TTS ([place])-recent left arm AV fistula revision Dr. Donzetta Matters 03/12/2021----documented?  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 Weston Anna  Presented from [facility] 5/20+ SOB + CP + dizzy-dizziness did not resolve At [facility] had CP/troponin bump 0.36-->0.7 secondary to?  ESRD/symptomatic anemia which seem to resolve with transfusion blood Her cardiologist recommended continuation aspirin  Found to be anemic on initial work-up Prior adenomatous colon polyps 01/13/2020 ([place])-large cecal adenoma piecemeal removed-did not follow-up for recommended colonoscopy  GI consulted-colonoscopy showed diverticulosis redundant colon and 2 diminutive polyps in transverse colon status post removal  Givens study planned for 5/25  Labs Sodium 128--->132 Potassium 2.9-->3.2 BUNs/creatinine 27/3.0 Hemoglobin 8.7-->8.0->

## 2021-03-20 ENCOUNTER — Encounter (HOSPITAL_COMMUNITY): Payer: Self-pay | Admitting: Gastroenterology

## 2021-03-20 DIAGNOSIS — D649 Anemia, unspecified: Secondary | ICD-10-CM | POA: Diagnosis not present

## 2021-03-20 LAB — RENAL FUNCTION PANEL
Albumin: 2.5 g/dL — ABNORMAL LOW (ref 3.5–5.0)
Anion gap: 10 (ref 5–15)
BUN: 25 mg/dL — ABNORMAL HIGH (ref 8–23)
CO2: 25 mmol/L (ref 22–32)
Calcium: 8.6 mg/dL — ABNORMAL LOW (ref 8.9–10.3)
Chloride: 92 mmol/L — ABNORMAL LOW (ref 98–111)
Creatinine, Ser: 3.4 mg/dL — ABNORMAL HIGH (ref 0.44–1.00)
GFR, Estimated: 13 mL/min — ABNORMAL LOW (ref >60)
Glucose, Bld: 165 mg/dL — ABNORMAL HIGH (ref 70–99)
Phosphorus: 3.7 mg/dL (ref 2.5–4.6)
Potassium: 2.6 mmol/L — CL (ref 3.5–5.1)
Sodium: 127 mmol/L — ABNORMAL LOW (ref 135–145)

## 2021-03-20 LAB — PREPARE RBC (CROSSMATCH)

## 2021-03-20 LAB — CBC
HCT: 21.7 % — ABNORMAL LOW (ref 36.0–46.0)
Hemoglobin: 7.2 g/dL — ABNORMAL LOW (ref 12.0–15.0)
MCH: 30.6 pg (ref 26.0–34.0)
MCHC: 33.2 g/dL (ref 30.0–36.0)
MCV: 92.3 fL (ref 80.0–100.0)
Platelets: 200 10*3/uL (ref 150–400)
RBC: 2.35 MIL/uL — ABNORMAL LOW (ref 3.87–5.11)
RDW: 15.7 % — ABNORMAL HIGH (ref 11.5–15.5)
WBC: 7.3 10*3/uL (ref 4.0–10.5)
nRBC: 0 % (ref 0.0–0.2)

## 2021-03-20 LAB — CBC WITH DIFFERENTIAL/PLATELET
Abs Immature Granulocytes: 0.05 10*3/uL (ref 0.00–0.07)
Basophils Absolute: 0 10*3/uL (ref 0.0–0.1)
Basophils Relative: 1 %
Eosinophils Absolute: 0.2 10*3/uL (ref 0.0–0.5)
Eosinophils Relative: 3 %
HCT: 21.3 % — ABNORMAL LOW (ref 36.0–46.0)
Hemoglobin: 7.1 g/dL — ABNORMAL LOW (ref 12.0–15.0)
Immature Granulocytes: 1 %
Lymphocytes Relative: 17 %
Lymphs Abs: 1.3 10*3/uL (ref 0.7–4.0)
MCH: 30.5 pg (ref 26.0–34.0)
MCHC: 33.3 g/dL (ref 30.0–36.0)
MCV: 91.4 fL (ref 80.0–100.0)
Monocytes Absolute: 0.8 10*3/uL (ref 0.1–1.0)
Monocytes Relative: 10 %
Neutro Abs: 5.2 10*3/uL (ref 1.7–7.7)
Neutrophils Relative %: 68 %
Platelets: 189 10*3/uL (ref 150–400)
RBC: 2.33 MIL/uL — ABNORMAL LOW (ref 3.87–5.11)
RDW: 15.5 % (ref 11.5–15.5)
WBC: 7.5 10*3/uL (ref 4.0–10.5)
nRBC: 0 % (ref 0.0–0.2)

## 2021-03-20 LAB — GLUCOSE, CAPILLARY
Glucose-Capillary: 161 mg/dL — ABNORMAL HIGH (ref 70–99)
Glucose-Capillary: 117 mg/dL — ABNORMAL HIGH (ref 70–99)
Glucose-Capillary: 223 mg/dL — ABNORMAL HIGH (ref 70–99)
Glucose-Capillary: 140 mg/dL — ABNORMAL HIGH (ref 70–99)

## 2021-03-20 LAB — MAGNESIUM: Magnesium: 1.9 mg/dL (ref 1.7–2.4)

## 2021-03-20 LAB — HEMOGLOBIN AND HEMATOCRIT, BLOOD
HCT: 26.4 % — ABNORMAL LOW (ref 36.0–46.0)
Hemoglobin: 8.8 g/dL — ABNORMAL LOW (ref 12.0–15.0)

## 2021-03-20 MED ORDER — HEPARIN SODIUM (PORCINE) 1000 UNIT/ML DIALYSIS
1000.0000 [IU] | INTRAMUSCULAR | Status: DC | PRN
  Filled 2021-03-20 (×2): qty 1

## 2021-03-20 MED ORDER — POTASSIUM CHLORIDE CRYS ER 20 MEQ PO TBCR
40.0000 meq | EXTENDED_RELEASE_TABLET | Freq: Once | ORAL | Status: AC
  Administered 2021-03-20: 40 meq via ORAL
  Filled 2021-03-20: qty 2

## 2021-03-20 MED ORDER — LIDOCAINE-PRILOCAINE 2.5-2.5 % EX CREA
1.0000 "application " | TOPICAL_CREAM | CUTANEOUS | Status: DC | PRN
  Filled 2021-03-20: qty 5

## 2021-03-20 MED ORDER — BISACODYL 5 MG PO TBEC
10.0000 mg | DELAYED_RELEASE_TABLET | Freq: Once | ORAL | Status: AC
  Administered 2021-03-20: 10 mg via ORAL
  Filled 2021-03-20: qty 2

## 2021-03-20 MED ORDER — LIDOCAINE HCL (PF) 1 % IJ SOLN: 5.0000 mL | INTRAMUSCULAR | Status: DC | PRN

## 2021-03-20 MED ORDER — PENTAFLUOROPROP-TETRAFLUOROETH EX AERO: 1.0000 "application " | INHALATION_SPRAY | CUTANEOUS | Status: DC | PRN

## 2021-03-20 MED ORDER — HEPARIN SODIUM (PORCINE) 1000 UNIT/ML IJ SOLN
INTRAMUSCULAR | Status: AC
  Administered 2021-03-20: 1000 [IU] via INTRAVENOUS_CENTRAL
  Filled 2021-03-20: qty 1

## 2021-03-20 MED ORDER — ACETAMINOPHEN 325 MG PO TABS
650.0000 mg | ORAL_TABLET | Freq: Once | ORAL | Status: AC
  Administered 2021-03-20: 650 mg via ORAL

## 2021-03-20 MED ORDER — SIMETHICONE 80 MG PO CHEW
80.0000 mg | CHEWABLE_TABLET | Freq: Once | ORAL | Status: AC
  Administered 2021-03-20: 80 mg via ORAL
  Filled 2021-03-20: qty 1

## 2021-03-20 MED ORDER — ONDANSETRON HCL 4 MG/2ML IJ SOLN
4.0000 mg | Freq: Once | INTRAMUSCULAR | Status: AC
  Administered 2021-03-20: 4 mg via INTRAVENOUS
  Filled 2021-03-20: qty 2

## 2021-03-20 MED ORDER — SODIUM CHLORIDE 0.9% IV SOLUTION: Freq: Once | INTRAVENOUS | Status: AC

## 2021-03-20 MED ORDER — ALTEPLASE 2 MG IJ SOLR: 2.0000 mg | Freq: Once | INTRAMUSCULAR | Status: DC | PRN

## 2021-03-20 MED ORDER — SODIUM CHLORIDE 0.9 % IV SOLN: 100.0000 mL | INTRAVENOUS | Status: DC | PRN

## 2021-03-20 MED ORDER — POTASSIUM CHLORIDE CRYS ER 20 MEQ PO TBCR
40.0000 meq | EXTENDED_RELEASE_TABLET | Freq: Two times a day (BID) | ORAL | Status: DC
  Administered 2021-03-20 – 2021-03-23 (×6): 40 meq via ORAL
  Filled 2021-03-20 (×7): qty 2

## 2021-03-20 MED ORDER — DIPHENHYDRAMINE HCL 25 MG PO CAPS
25.0000 mg | ORAL_CAPSULE | Freq: Once | ORAL | Status: AC
  Administered 2021-03-20: 25 mg via ORAL

## 2021-03-20 NOTE — Progress Notes (Signed)
Pt c/o nausea and gas pain. Pt requests Simethicone and nausea PRN. MD paged.

## 2021-03-20 NOTE — Plan of Care (Signed)
  Problem: Health Behavior/Discharge Planning: Goal: Ability to manage health-related needs will improve Outcome: Progressing   Problem: Education: Goal: Knowledge of General Education information will improve Description: Including pain rating scale, medication(s)/side effects and non-pharmacologic comfort measures Outcome: Progressing   Problem: Clinical Measurements: Goal: Cardiovascular complication will be avoided Outcome: Progressing   Problem: Clinical Measurements: Goal: Respiratory complications will improve Outcome: Progressing   Problem: Clinical Measurements: Goal: Diagnostic test results will improve Outcome: Progressing   Problem: Clinical Measurements: Goal: Will remain free from infection Outcome: Progressing

## 2021-03-20 NOTE — H&P (View-Only) (Signed)
Daily Rounding Note  03/20/2021, 10:37 AM  LOS: 5 days   SUBJECTIVE:   Chief complaint:  FOBT + anemia    Receiving 1 PRBC at HD currently.  Fleeting post prandial fleeting upper abd discomfort after eating "too much" last PM, after limited po during day.  Nausea w/o vomiting this AM.  Pt says last BM was at [facility], at least 4 d ago.  Staff confirms this, no reports of bowel movements yesterday and none this morning despite mention of maroon stool in nephrology note this morning.  Normally uses Miralax regularly w good effect.    OBJECTIVE:         Vital signs in last 24 hours:    Temp:  [97.6 F (36.4 C)-98.5 F (36.9 C)] 98.3 F (36.8 C) (05/26 1023) Pulse Rate:  [62-73] 65 (05/26 1026) Resp:  [16-22] 22 (05/26 1023) BP: (111-160)/(39-94) 137/45 (05/26 1026) SpO2:  [95 %-100 %] 99 % (05/26 1023) Weight:  [73.7 kg-75.2 kg] 75.2 kg (05/26 0808) Last BM Date: 03/19/21 Filed Weights   03/19/21 0300 03/20/21 0358 03/20/21 0808  Weight: 72.7 kg 73.7 kg 75.2 kg   Seen during HD session, receiving PRBC.   General: looks pale, chronically ill but comfortable and alert   Heart: RRR Chest: no dyspnea.  Clear bil Abdomen: soft, NT, ND.  Active BS  Extremities: no CCE Neuro/Psych:  Appropriate.  Moves all 4 limbs  Intake/Output from previous day: 05/25 0701 - 05/26 0700 In: 480 [P.O.:480] Out: -   Intake/Output this shift: No intake/output data recorded.  Lab Results: Recent Labs    03/19/21 0214 03/20/21 0302 03/20/21 0746  WBC  --  7.5 7.3  HGB 8.0* 7.1* 7.2*  HCT 24.0* 21.3* 21.7*  PLT  --  189 200   BMET Recent Labs    03/18/21 0103 03/20/21 0302  NA 132* 127*  K 3.2* 2.6*  CL 94* 92*  CO2 27 25  GLUCOSE 98 165*  BUN 27* 25*  CREATININE 3.01* 3.40*  CALCIUM 8.5* 8.6*   LFT Recent Labs    03/20/21 0302  ALBUMIN 2.5*   PT/INR No results for input(s): LABPROT, INR in the last 72  hours. Hepatitis Panel No results for input(s): HEPBSAG, HCVAB, HEPAIGM, HEPBIGM in the last 72 hours.  Studies/Results: No results found.  ASSESMENT:   *  IDA.  FOBT +.   Transfused 2 PRBCs for Hgb 6.8 PTA.  No transfusions at [facility] 01/14/2020 colonoscopy in [place].  Piecemeal removal of large cecal TA. Small sigmoid adenoma. 01/14/2020 EGD.  In [place].  Grossly normal and biopsies of stomach and duodenum normal. 03/18/21 Colonoscopy: Cold snare removal of 2 diminutive polyps (1 TA w/o HGD.  1 small serrated polyp can not distinguish between SSP and HP type) from transverse and cecum.  Left colon diverticulosis.  Redundant colon. 03/18/2021 EGD: Normal to examined duodenum. 03/19/21 VCE, completed, to be read today   Hgb 8 >> 7.2 over last 30 hours.  1 PRBC transfusing now.    *   ESRD.  TTS HD.     *   Constipation.     PLAN   *   Await reading of VCE.    *   Ordered dulcolax 10 mg x 1, daily Miralax is in place.      Anna Carr  03/20/2021, 10:37 AM Phone [phone]  I have discussed the case with the APP, and that is  the plan I formulated. I personally interviewed and examined the patient.  Family members were at the bedside during my visit.  Patient had dialysis this morning and was transfused another unit of PRBCs for drop in hemoglobin to 7.0.  Curiously, she reports having only 1 small BM yesterday and nothing overnight, so its not clear if we are getting accurate reports on whether or not she is passing blood.  I reviewed the video capsule study this morning, and there is active bleeding in the proximal small bowel and scattered areas of fresh blood and melena throughout the remainder of the small bowel.  With the resultant decreased mucosal visualization, it is difficult to know there is 1 or multiple bleeding sources.  There is certainly one of the proximal small bowel, about 15 minutes of transit time past the first duodenal image.  This means it might  be within the reach of small bowel enteroscopy.  I talked with her at length and recommended a small bowel enteroscopy tomorrow.  She was agreeable after discussion of procedure and risks.  The benefits and risks of the planned procedure were described in detail with the patient or (when appropriate) their health care proxy.  Risks were outlined as including, but not limited to, bleeding, infection, perforation, adverse medication reaction leading to cardiac or pulmonary decompensation, pancreatitis (if ERCP).  The limitation of incomplete mucosal visualization was also discussed.  No guarantees or warranties were given.  Patient at increased risk for cardiopulmonary complications of procedure due to medical comorbidities.   CBC this evening and tomorrow morning  35 minutes were spent on this encounter (including chart review, history/exam, counseling/coordination of care, and documentation) > 50% of that time was spent on counseling and coordination of care.  Topics discussed included: See above   Anna Carr Office: [phone]  Addendum  Pathology of polyps removed during inpatient colonoscopy revealed tubular adenoma and sessile serrated polyp. Patient will be notified tomorrow, and no surveillance colonoscopy recommended due to patient's age.  Madelon Lips, MD 03/20/21 4:50 PM

## 2021-03-20 NOTE — Progress Notes (Signed)
[facility] Progress Note   Subjective:   Patient seen and examined in room.  Undergoing capsule study today.  Reports minor abdominal discomfort.  Denies CP, SOB, n/v, weakness and fatigue.  Liquid dark brown/maroonish stool this AM.   Objective Vitals:   03/20/21 0358 03/20/21 0808 03/20/21 0811 03/20/21 0814  BP: (!) 111/94 (!) 120/40 (!) 132/57 (!) 130/45  Pulse: 72 69 69 66  Resp: 18 18    Temp:  98.1 F (36.7 C)    TempSrc:  Oral    SpO2: 95% 97%    Weight: 73.7 kg 75.2 kg     Physical Exam General:well appearing female in NAD, sitting on side of bed Heart:RRR, no mrg Lungs:CTAB, nml WOB on RA Abdomen:soft, NTND Extremities:trace LE edema Dialysis Access: R IJ TDC, new LU AVG +b   Filed Weights   03/19/21 0300 03/20/21 0358 03/20/21 0808  Weight: 72.7 kg 73.7 kg 75.2 kg    Intake/Output Summary (Last 24 hours) at 03/20/2021 0827 Last data filed at 03/19/2021 2000 Gross per 24 hour  Intake 480 ml  Output --  Net 480 ml    Additional Objective Labs: Basic Metabolic Panel: Recent Labs  Lab 03/17/21 0903 03/18/21 0103 03/20/21 0302  NA 128* 132* 127*  K 2.9* 3.2* 2.6*  CL 94* 94* 92*  CO2 '22 27 25  '$ GLUCOSE 220* 98 165*  BUN 59* 27* 25*  CREATININE 4.16* 3.01* 3.40*  CALCIUM 8.5* 8.5* 8.6*  PHOS 3.9  --  3.7   Liver Function Tests: Recent Labs  Lab 03/17/21 0903 03/20/21 0302  ALBUMIN 2.7* 2.5*   CBC: Recent Labs  Lab 03/16/21 0033 03/17/21 0703 03/17/21 0844 03/18/21 0103 03/19/21 0214 03/20/21 0302  WBC 10.7*  --  10.7*  --   --  7.5  NEUTROABS  --   --  8.2*  --   --  5.2  HGB 9.4*   < > 8.5* 8.7* 8.0* 7.1*  HCT 27.9*   < > 25.5* 26.9* 24.0* 21.3*  MCV 92.7  --  92.7  --   --  91.4  PLT 187  --  166  --   --  189   < > = values in this interval not displayed.   CBG: Recent Labs  Lab 03/18/21 2153 03/19/21 0600 03/19/21 1205 03/19/21 2119 03/20/21 0617  GLUCAP 203* 117* 129* 152* 161*   Iron Studies:  No  results for input(s): IRON, TIBC, TRANSFERRIN, FERRITIN in the last 72 hours. Studies/Results: No results found.  Medications: . sodium chloride Stopped (03/17/21 2300)  . sodium chloride    . sodium chloride     . sodium chloride   Intravenous Once  . acetaminophen  650 mg Oral Once  . aspirin EC  81 mg Oral Daily  . carvedilol  6.25 mg Oral BID WC  . Chlorhexidine Gluconate Cloth  6 each Topical Q0600  . darbepoetin (ARANESP) injection - DIALYSIS  40 mcg Intravenous Q Tue-HD  . diphenhydrAMINE  25 mg Oral Once  . ferric citrate  210 mg Oral TID WC  . insulin aspart  0-15 Units Subcutaneous TID WC  . insulin glargine  10 Units Subcutaneous Daily  . pantoprazole (PROTONIX) IV  40 mg Intravenous Q24H  . polyethylene glycol  17 g Oral Daily  . potassium chloride  40 mEq Oral BID    Dialysis Orders: [place] TTS 3.5h 400/500 EDW 73kg 3K/2.5Ca UPF 2 TDC  Mircera 30 q 4 weeks (last 5/17)  Assessment/Plan: 1. GIB - Hx adenomatous colon polys - missed repeat colonscopy.  +FOBT. s/p 2 units pRBC. Hgb 8.0 today.  Ongoing dark/maroon stool. GI following, EGD/colonscopy 5/24 with no source of bleeding identified. Capsule study initiated 5/25 am.  2. ESRD -HD TTS.  5/24 HD net UF 3L  Seen on  HD through [facility] TC 130/45 3/2.5 UF 2.5L -> increase to 3 with 2.5 net as tolerated.    3. Dialysis access: s/p conversion LA AVF to AVG 5/18 per Dr. Donzetta Matters.  Finished rocephin. Venous duplex with no thrombosis.  4. Hypertension/volume - BP mildly elevated this AM, no meds yet d/t procedure. Continue home meds- on amlodipine, carvedilol.  Does not appear volume overloaded.  Under dry weight post HD yesterday, edema improved.  May need to lower dry. Continue UF as tolerated. 5. Anemia - s/p transfusion as above. Recent ESA dose as outpatient. Plan to give additional 30 mcg aranesp with HD yesterday. Follow trends. 6. Metabolic bone disease -Ca and phos in goal.  Binders held d/t melena.  Follow labs.  7. DMT2 - insulin per primary 8. Nutrition - renal diet w/fluid restrictions when advanced.     Dwana Melena, MD 03/20/2021, 8:27 AM

## 2021-03-20 NOTE — Progress Notes (Addendum)
Daily Rounding Note  03/20/2021, 10:37 AM  LOS: 5 days   SUBJECTIVE:   Chief complaint:  FOBT + anemia    Receiving 1 PRBC at HD currently.  Fleeting post prandial fleeting upper abd discomfort after eating "too much" last PM, after limited po during day.  Nausea w/o vomiting this AM.  Pt says last BM was at [facility], at least 4 d ago.  Staff confirms this, no reports of bowel movements yesterday and none this morning despite mention of maroon stool in nephrology note this morning.  Normally uses Miralax regularly w good effect.    OBJECTIVE:         Vital signs in last 24 hours:    Temp:  [97.6 F (36.4 C)-98.5 F (36.9 C)] 98.3 F (36.8 C) (05/26 1023) Pulse Rate:  [62-73] 65 (05/26 1026) Resp:  [16-22] 22 (05/26 1023) BP: (111-160)/(39-94) 137/45 (05/26 1026) SpO2:  [95 %-100 %] 99 % (05/26 1023) Weight:  [73.7 kg-75.2 kg] 75.2 kg (05/26 0808) Last BM Date: 03/19/21 Filed Weights   03/19/21 0300 03/20/21 0358 03/20/21 0808  Weight: 72.7 kg 73.7 kg 75.2 kg   Seen during HD session, receiving PRBC.   General: looks pale, chronically ill but comfortable and alert   Heart: RRR Chest: no dyspnea.  Clear bil Abdomen: soft, NT, ND.  Active BS  Extremities: no CCE Neuro/Psych:  Appropriate.  Moves all 4 limbs  Intake/Output from previous day: 05/25 0701 - 05/26 0700 In: 480 [P.O.:480] Out: -   Intake/Output this shift: No intake/output data recorded.  Lab Results: Recent Labs    03/19/21 0214 03/20/21 0302 03/20/21 0746  WBC  --  7.5 7.3  HGB 8.0* 7.1* 7.2*  HCT 24.0* 21.3* 21.7*  PLT  --  189 200   BMET Recent Labs    03/18/21 0103 03/20/21 0302  NA 132* 127*  K 3.2* 2.6*  CL 94* 92*  CO2 27 25  GLUCOSE 98 165*  BUN 27* 25*  CREATININE 3.01* 3.40*  CALCIUM 8.5* 8.6*   LFT Recent Labs    03/20/21 0302  ALBUMIN 2.5*   PT/INR No results for input(s): LABPROT, INR in the last 72  hours. Hepatitis Panel No results for input(s): HEPBSAG, HCVAB, HEPAIGM, HEPBIGM in the last 72 hours.  Studies/Results: No results found.  ASSESMENT:   *  IDA.  FOBT +.   Transfused 2 PRBCs for Hgb 6.8 PTA.  No transfusions at [facility] 01/14/2020 colonoscopy in [place].  Piecemeal removal of large cecal TA. Small sigmoid adenoma. 01/14/2020 EGD.  In [place].  Grossly normal and biopsies of stomach and duodenum normal. 03/18/21 Colonoscopy: Cold snare removal of 2 diminutive polyps (1 TA w/o HGD.  1 small serrated polyp can not distinguish between SSP and HP type) from transverse and cecum.  Left colon diverticulosis.  Redundant colon. 03/18/2021 EGD: Normal to examined duodenum. 03/19/21 VCE, completed, to be read today   Hgb 8 >> 7.2 over last 30 hours.  1 PRBC transfusing now.    *   ESRD.  TTS HD.     *   Constipation.     PLAN   *   Await reading of VCE.    *   Ordered dulcolax 10 mg x 1, daily Miralax is in place.      [name]  03/20/2021, 10:37 AM Phone [phone]  I have discussed the case with the APP, and that is  the plan I formulated. I personally interviewed and examined the patient.  Family members were at the bedside during my visit.  Patient had dialysis this morning and was transfused another unit of PRBCs for drop in hemoglobin to 7.0.  Curiously, she reports having only 1 small BM yesterday and nothing overnight, so its not clear if we are getting accurate reports on whether or not she is passing blood.  I reviewed the video capsule study this morning, and there is active bleeding in the proximal small bowel and scattered areas of fresh blood and melena throughout the remainder of the small bowel.  With the resultant decreased mucosal visualization, it is difficult to know there is 1 or multiple bleeding sources.  There is certainly one of the proximal small bowel, about 15 minutes of transit time past the first duodenal image.  This means it might  be within the reach of small bowel enteroscopy.  I talked with her at length and recommended a small bowel enteroscopy tomorrow.  She was agreeable after discussion of procedure and risks.  The benefits and risks of the planned procedure were described in detail with the patient or (when appropriate) their health care proxy.  Risks were outlined as including, but not limited to, bleeding, infection, perforation, adverse medication reaction leading to cardiac or pulmonary decompensation, pancreatitis (if ERCP).  The limitation of incomplete mucosal visualization was also discussed.  No guarantees or warranties were given.  Patient at increased risk for cardiopulmonary complications of procedure due to medical comorbidities.   CBC this evening and tomorrow morning  35 minutes were spent on this encounter (including chart review, history/exam, counseling/coordination of care, and documentation) > 50% of that time was spent on counseling and coordination of care.  Topics discussed included: See above   [name] Office: [phone]  Addendum  Pathology of polyps removed during inpatient colonoscopy revealed tubular adenoma and sessile serrated polyp. Patient will be notified tomorrow, and no surveillance colonoscopy recommended due to patient's age.  Madelon Lips, MD 03/20/21 4:50 PM

## 2021-03-20 NOTE — Progress Notes (Signed)
PROGRESS NOTE   Anna Carr  [id number] DOB: [birth year] DOA: 03/15/2021 PCP: Raina Mina., MD  Brief Narrative:  80 year old community dwelling female ESRD HD TTS ([place])-recent left arm AV fistula revision Dr. Donzetta Matters 03/12/2021----documented?  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 Weston Anna  Presented from [facility] 5/20+ SOB + CP + dizzy-dizziness did not resolve At [facility] had CP/troponin bump 0.36-->0.7 secondary to?  ESRD/symptomatic anemia which seem to resolve with transfusion blood Her cardiologist recommended continuation aspirin  Found to be anemic on initial work-up Prior adenomatous colon polyps 01/13/2020 ([place])-large cecal adenoma piecemeal removed-did not follow-up for recommended colonoscopy  GI consulted-colonoscopy showed diverticulosis redundant colon and 2 diminutive polyps in transverse colon status post removal  [place] performed 5/25 shows some concern for small bowel bleed and GI planning small bowel enteroscopy 5/27  Hospital-Problem based course SOB/dizzy with anemia on admission Improved with transfusions ESRD TTS [place] [facility] AV fistula revision Dr. Donzetta Matters 03/12/2021 SIADH Sodium improving with reinitiation of dialysis Aranesp/binders etc. as per nephrology (once melena/bleeding sorted out)--might nee dIV iron prior to d/c? Continue to challenge EDW as per Dr. Augustin Coupe AV fistula appears benign Rocephin ordered from 5/21 and discontinued 5/25-ultrasound of the area of fistula ruled out thrombosis Symptomatic anemia, FOBT  + Transfused 2 units PRBC EGD colonoscopy 5/24 no overt source bleed capsule study 5/25 = active bleed proximal small bowel Dr. Loletha Carrow planning small bowel enteroscopy 5/27 Elevated troponin on admission Not felt to be cardiac but due to ESRD/anemia, no heparinization required Continues aspirin 81 Coreg 6.25 Hyperglycemia predominant DM TY 2 CBGs ranging 117-160 Continue  Lantus 10 units and sliding scale Home dose is 12 units Hypokalemia Potassium down to 2.6 Replacing with p.o. K 40 twice daily Recheck 8 AM mag    DVT prophylaxis: scd Code Status: full Family Communication: Long discussion at the bedside with son and daughter earlier today Disposition:  Status is: Inpatient  Remains inpatient appropriate because:Hemodynamically unstable, Persistent severe electrolyte disturbances and Altered mental status   Dispo: The patient is from: Home              Anticipated d/c is to: SNF              Patient currently is not medically stable to d/c.   Difficult to place patient No       Consultants:   GI  Nephro    Procedures:  Endo/COlo 5/24  Antimicrobials:  Ceftriaxone discontinued 5/25   Subjective: No further stool-no chest pain no dizziness Initially seen on HD unit-subsequently seen on floor with family in room who had questions about neck steps   Objective: Vitals:   03/20/21 1200 03/20/21 1211 03/20/21 1220 03/20/21 1245  BP: (!) 137/39 (!) 136/40 (!) 152/55 (!) 135/43  Pulse: 62 65 74 76  Resp:  '17 19 16  '$ Temp:  98.6 F (37 C)  99.1 F (37.3 C)  TempSrc:  Oral  Oral  SpO2:  97%  99%  Weight:   71.9 kg     Intake/Output Summary (Last 24 hours) at 03/20/2021 1451 Last data filed at 03/20/2021 1358 Gross per 24 hour  Intake 1015 ml  Output 3000 ml  Net -1985 ml   Filed Weights   03/20/21 0358 03/20/21 0808 03/20/21 1220  Weight: 73.7 kg 75.2 kg 71.9 kg    Examination:  Coherent awake pleasant looking at bills in her bed CTA B no added sound rales  rhonchi Abdomen soft nontender no rebound no guarding No lower extremity edema Neurologically intact moving all 4 limbs  Data Reviewed: personally reviewed   CBC    Component Value Date/Time   WBC 7.3 03/20/2021 0746   RBC 2.35 (L) 03/20/2021 0746   HGB 7.2 (L) 03/20/2021 0746   HCT 21.7 (L) 03/20/2021 0746   PLT 200 03/20/2021 0746   MCV 92.3 03/20/2021  0746   MCH 30.6 03/20/2021 0746   MCHC 33.2 03/20/2021 0746   RDW 15.7 (H) 03/20/2021 0746   LYMPHSABS 1.3 03/20/2021 0302   MONOABS 0.8 03/20/2021 0302   EOSABS 0.2 03/20/2021 0302   BASOSABS 0.0 03/20/2021 0302   CMP Latest Ref Rng & Units 03/20/2021 03/18/2021 03/17/2021  Glucose 70 - 99 mg/dL 165(H) 98 220(H)  BUN 8 - 23 mg/dL 25(H) 27(H) 59(H)  Creatinine 0.44 - 1.00 mg/dL 3.40(H) 3.01(H) 4.16(H)  Sodium 135 - 145 mmol/L 127(L) 132(L) 128(L)  Potassium 3.5 - 5.1 mmol/L 2.6(LL) 3.2(L) 2.9(L)  Chloride 98 - 111 mmol/L 92(L) 94(L) 94(L)  CO2 22 - 32 mmol/L '25 27 22  '$ Calcium 8.9 - 10.3 mg/dL 8.6(L) 8.5(L) 8.5(L)     Radiology Studies: No results found.   Scheduled Meds: . aspirin EC  81 mg Oral Daily  . carvedilol  6.25 mg Oral BID WC  . Chlorhexidine Gluconate Cloth  6 each Topical Q0600  . darbepoetin (ARANESP) injection - DIALYSIS  40 mcg Intravenous Q Tue-HD  . ferric citrate  210 mg Oral TID WC  . insulin aspart  0-15 Units Subcutaneous TID WC  . insulin glargine  10 Units Subcutaneous Daily  . polyethylene glycol  17 g Oral Daily  . potassium chloride  40 mEq Oral BID   Continuous Infusions: . sodium chloride Stopped (03/17/21 2300)     LOS: 5 days   Time spent: Sheldon, MD [facility] To contact the attending provider between 7A-7P or the covering provider during after hours 7P-7A, please log into the web site [email or web address] and access using universal [place] password for that web site. If you do not have the password, please call the hospital operator.  03/20/2021, 2:51 PM

## 2021-03-20 NOTE — Progress Notes (Signed)
Potassium = 2.6 and Hgb = 7.1. MD paged.

## 2021-03-20 NOTE — Plan of Care (Signed)

## 2021-03-21 ENCOUNTER — Inpatient Hospital Stay (HOSPITAL_COMMUNITY): Payer: Medicare Other | Admitting: Anesthesiology

## 2021-03-21 ENCOUNTER — Encounter (HOSPITAL_COMMUNITY): Payer: Self-pay | Admitting: Family Medicine

## 2021-03-21 ENCOUNTER — Encounter (HOSPITAL_COMMUNITY)
Admission: AD | Disposition: A | Discharge status: Home / Self Care | Payer: Self-pay | Source: Other Acute Inpatient Hospital | Attending: Family Medicine

## 2021-03-21 DIAGNOSIS — K922 Gastrointestinal hemorrhage, unspecified: Secondary | ICD-10-CM

## 2021-03-21 DIAGNOSIS — D649 Anemia, unspecified: Secondary | ICD-10-CM | POA: Diagnosis not present

## 2021-03-21 DIAGNOSIS — K31811 Angiodysplasia of stomach and duodenum with bleeding: Secondary | ICD-10-CM

## 2021-03-21 HISTORY — PX: ENTEROSCOPY: SHX5533

## 2021-03-21 HISTORY — PX: HOT HEMOSTASIS: SHX5433

## 2021-03-21 LAB — COMPREHENSIVE METABOLIC PANEL
ALT: 8 U/L (ref 0–44)
AST: 14 U/L — ABNORMAL LOW (ref 15–41)
Albumin: 2.6 g/dL — ABNORMAL LOW (ref 3.5–5.0)
Alkaline Phosphatase: 52 U/L (ref 38–126)
Anion gap: 7 (ref 5–15)
BUN: 23 mg/dL (ref 8–23)
CO2: 29 mmol/L (ref 22–32)
Calcium: 8.3 mg/dL — ABNORMAL LOW (ref 8.9–10.3)
Chloride: 96 mmol/L — ABNORMAL LOW (ref 98–111)
Creatinine, Ser: 2.9 mg/dL — ABNORMAL HIGH (ref 0.44–1.00)
GFR, Estimated: 16 mL/min — ABNORMAL LOW (ref >60)
Glucose, Bld: 110 mg/dL — ABNORMAL HIGH (ref 70–99)
Potassium: 3.6 mmol/L (ref 3.5–5.1)
Sodium: 132 mmol/L — ABNORMAL LOW (ref 135–145)
Total Bilirubin: 0.6 mg/dL (ref 0.3–1.2)
Total Protein: 5.4 g/dL — ABNORMAL LOW (ref 6.5–8.1)

## 2021-03-21 LAB — CBC
HCT: 25 % — ABNORMAL LOW (ref 36.0–46.0)
Hemoglobin: 8.3 g/dL — ABNORMAL LOW (ref 12.0–15.0)
MCH: 30.6 pg (ref 26.0–34.0)
MCHC: 33.2 g/dL (ref 30.0–36.0)
MCV: 92.3 fL (ref 80.0–100.0)
Platelets: 223 10*3/uL (ref 150–400)
RBC: 2.71 MIL/uL — ABNORMAL LOW (ref 3.87–5.11)
RDW: 15.7 % — ABNORMAL HIGH (ref 11.5–15.5)
WBC: 7.6 10*3/uL (ref 4.0–10.5)
nRBC: 0 % (ref 0.0–0.2)

## 2021-03-21 LAB — MAGNESIUM: Magnesium: 1.9 mg/dL (ref 1.7–2.4)

## 2021-03-21 LAB — CULTURE, BLOOD (ROUTINE X 2)
Culture: NO GROWTH
Culture: NO GROWTH

## 2021-03-21 LAB — GLUCOSE, CAPILLARY
Glucose-Capillary: 107 mg/dL — ABNORMAL HIGH (ref 70–99)
Glucose-Capillary: 202 mg/dL — ABNORMAL HIGH (ref 70–99)

## 2021-03-21 SURGERY — ENTEROSCOPY
Anesthesia: Monitor Anesthesia Care

## 2021-03-21 MED ORDER — GLUCAGON HCL RDNA (DIAGNOSTIC) 1 MG IJ SOLR
INTRAMUSCULAR | Status: DC | PRN
  Administered 2021-03-21 (×2): .5 mg via INTRAVENOUS

## 2021-03-21 MED ORDER — PROPOFOL 500 MG/50ML IV EMUL
INTRAVENOUS | Status: DC | PRN
  Administered 2021-03-21: 100 ug/kg/min via INTRAVENOUS

## 2021-03-21 MED ORDER — SODIUM CHLORIDE 0.9 % IV SOLN: INTRAVENOUS | Status: DC | PRN

## 2021-03-21 MED ORDER — PROPOFOL 10 MG/ML IV BOLUS
INTRAVENOUS | Status: DC | PRN
  Administered 2021-03-21: 50 mg via INTRAVENOUS

## 2021-03-21 MED ORDER — LIDOCAINE 2% (20 MG/ML) 5 ML SYRINGE
INTRAMUSCULAR | Status: DC | PRN
  Administered 2021-03-21: 100 mg via INTRAVENOUS

## 2021-03-21 NOTE — Transfer of Care (Signed)
Immediate Anesthesia Transfer of Care Note  Patient: Anna Carr  Procedure(s) Performed: ENTEROSCOPY (N/A ) HOT HEMOSTASIS (ARGON PLASMA COAGULATION/BICAP) (N/A )  Patient Location: Endoscopy Unit  Anesthesia Type:MAC  Level of Consciousness: drowsy and patient cooperative  Airway & Oxygen Therapy: Patient Spontanous Breathing  Post-op Assessment: Report given to RN and Post -op Vital signs reviewed and stable  Post vital signs: Reviewed and stable  Last Vitals:  Vitals Value Taken Time  BP 132/52 03/21/21 1319  Temp 36.3 C 03/21/21 1259  Pulse 71 03/21/21 1319  Resp 19 03/21/21 1319  SpO2 97 % 03/21/21 1319    Last Pain:  Vitals:   03/21/21 1319  TempSrc:   PainSc: 0-No pain         Complications: No complications documented.

## 2021-03-21 NOTE — Anesthesia Postprocedure Evaluation (Signed)
Anesthesia Post Note  Patient: Anna Carr  Procedure(s) Performed: ENTEROSCOPY (N/A ) HOT HEMOSTASIS (ARGON PLASMA COAGULATION/BICAP) (N/A )     Patient location during evaluation: PACU Anesthesia Type: MAC Level of consciousness: awake and alert Pain management: pain level controlled Vital Signs Assessment: post-procedure vital signs reviewed and stable Respiratory status: spontaneous breathing and respiratory function stable Cardiovascular status: stable Postop Assessment: no apparent nausea or vomiting Anesthetic complications: no   No complications documented.  Last Vitals:  Vitals:   03/21/21 1114 03/21/21 1259  BP: (!) 175/41 (!) 113/38  Pulse: 68 72  Resp: (!) 21 14  Temp: (!) 36.4 C (!) 36.3 C  SpO2: 98% 94%    Last Pain:  Vitals:   03/21/21 1259  TempSrc: Tympanic  PainSc: Asleep                 Merlinda Frederick

## 2021-03-21 NOTE — Progress Notes (Addendum)
Subjective: Tolerated dialysis yesterday, now seen in room in wheelchair  About to go   for small bowel enteroscopy.  Current complaints  Objective Vital signs in last 24 hours: Vitals:   03/20/21 1245 03/20/21 1721 03/20/21 1955 03/21/21 0404  BP: (!) 135/43 (!) 164/45 (!) 151/44 139/60  Pulse: 76 84 72 68  Resp: '16 18 18 17  '$ Temp: 99.1 F (37.3 C) 98.9 F (37.2 C) 97.6 F (36.4 C) 97.9 F (36.6 C)  TempSrc: Oral Oral Oral Oral  SpO2: 99% 98% 98% 98%  Weight:    73 kg   Weight change: 1.49 kg  Physical Exam: General: Alert elderly female NAD sitting in wheelchair Heart: RRR no MRG Lungs: CTA nonlabored breathing Abdomen: Bowel sounds positive normoactive, NTND Extremities: Trace bipedal edema  dialysis Access: Right IJ TDC, left upper arm AVG positive bruit inserted (03/12/21)  Dialysis Orders: [place] TTS 3.5h 400/500 EDW 73kg 3K/2.5Ca UPF 2  R IJ TDC /revision LUA AVF with placement AVGG insert 03/12/2021 Mircera 30 q 4 weeks (last 5/17)  Problem/Plan: 1. GI bleed-GI work-up in progress ,Hx adenomatous colon polys, yesterday video capsule study per GI="  active bleeding in the proximal small bowel and scattered areas of fresh blood and melena throughout the remainder of the small bowel.  With the resultant decreased mucosal visualization',  For am = small bowel enteroscopy  AM HGB 8.3  Transfused yest  1 unit packed red blood cells yest , 3 total since admit 2     .ESRD -HD TTS on schedule 3.0 l uf  5/26  Post wt 71.9 (lower edw at dc), no heparin 3.     Anemia of ESRD and GI bleed- s/p transfusion as above. Recent ESA dose as outpatient.givenadditional30 mcg aranespwith HD.Follow trends  4       HTN/volume = a.m. BP 139/60,  BP meds carvedilol, 6.25 mg twice daily , lower edw with uf yest and uf tomor 2l as bp allows 5.       Secondary hyperparathyroidism -CA, phosphorus stable, binders on hold 6. DM 2= per primary 7.  Nutrition= renal carb modified when taking  p.o.'s, albumin 2.6 protein supplement when on po  Ernest Haber, PA-C [facility] Beeper [phone] 03/21/2021,11:13 AM  LOS: 6 days   I have seen and examined this patient and agree with the plan of care. Planning on RRT 5/28 on TTS regimen. No acute indication today.  Dwana Melena, MD 03/21/2021, 12:00 PM  Labs: Basic Metabolic Panel: Recent Labs  Lab 03/17/21 0903 03/18/21 0103 03/20/21 0302 03/21/21 0239  NA 128* 132* 127* 132*  K 2.9* 3.2* 2.6* 3.6  CL 94* 94* 92* 96*  CO2 '22 27 25 29  '$ GLUCOSE 220* 98 165* 110*  BUN 59* 27* 25* 23  CREATININE 4.16* 3.01* 3.40* 2.90*  CALCIUM 8.5* 8.5* 8.6* 8.3*  PHOS 3.9  --  3.7  --    Liver Function Tests: Recent Labs  Lab 03/17/21 0903 03/20/21 0302 03/21/21 0239  AST  --   --  14*  ALT  --   --  8  ALKPHOS  --   --  52  BILITOT  --   --  0.6  PROT  --   --  5.4*  ALBUMIN 2.7* 2.5* 2.6*   No results for input(s): LIPASE, AMYLASE in the last 168 hours. No results for input(s): AMMONIA in the last 168 hours. CBC: Recent Labs  Lab 03/16/21 0033 03/17/21 0703 03/17/21 0844 03/18/21  0103 03/20/21 0302 03/20/21 0746 03/20/21 1543 03/21/21 0239  WBC 10.7*  --  10.7*  --  7.5 7.3  --  7.6  NEUTROABS  --   --  8.2*  --  5.2  --   --   --   HGB 9.4*   < > 8.5*   < > 7.1* 7.2* 8.8* 8.3*  HCT 27.9*   < > 25.5*   < > 21.3* 21.7* 26.4* 25.0*  MCV 92.7  --  92.7  --  91.4 92.3  --  92.3  PLT 187  --  166  --  189 200  --  223   < > = values in this interval not displayed.   Cardiac Enzymes: No results for input(s): CKTOTAL, CKMB, CKMBINDEX, TROPONINI in the last 168 hours. CBG: Recent Labs  Lab 03/20/21 0617 03/20/21 1247 03/20/21 1616 03/20/21 2115 03/21/21 0608  GLUCAP 161* 117* 223* 140* 107*    Studies/Results: No results found. Medications: . [MAR Hold] sodium chloride Stopped (03/17/21 2300)   . [MAR Hold] aspirin EC  81 mg Oral Daily  . [MAR Hold] carvedilol  6.25 mg Oral BID WC  . [MAR  Hold] Chlorhexidine Gluconate Cloth  6 each Topical Q0600  . [MAR Hold] darbepoetin (ARANESP) injection - DIALYSIS  40 mcg Intravenous Q Tue-HD  . [MAR Hold] ferric citrate  210 mg Oral TID WC  . [MAR Hold] insulin aspart  0-15 Units Subcutaneous TID WC  . [MAR Hold] insulin glargine  10 Units Subcutaneous Daily  . [MAR Hold] polyethylene glycol  17 g Oral Daily  . [MAR Hold] potassium chloride  40 mEq Oral BID

## 2021-03-21 NOTE — Anesthesia Preprocedure Evaluation (Signed)
Anesthesia Evaluation    History of Anesthesia Complications Negative for: history of anesthetic complications  Airway Mallampati: II  TM Distance: >3 FB Neck ROM: Full    Dental  (+) Edentulous Upper, Missing,    Pulmonary COPD, former smoker,    Pulmonary exam normal        Cardiovascular hypertension, Pt. on medications and Pt. on home beta blockers +CHF  Normal cardiovascular exam  TTE 2021: normal EF, moderate TR   Neuro/Psych PSYCHIATRIC DISORDERS Depression negative neurological ROS     GI/Hepatic Neg liver ROS, GIB   Endo/Other  diabetes, Type 2, Oral Hypoglycemic Agents  Renal/GU Dialysis and ESRFRenal disease (HD T/Th/Sa)  negative genitourinary   Musculoskeletal  (+) Arthritis ,   Abdominal   Peds  Hematology  (+) anemia , Hgb 8.7   Anesthesia Other Findings   Reproductive/Obstetrics negative OB ROS                             Anesthesia Physical  Anesthesia Plan  ASA: III  Anesthesia Plan: MAC   Post-op Pain Management:    Induction:   PONV Risk Score and Plan: Treatment may vary due to age or medical condition and Propofol infusion  Airway Management Planned: Natural Airway and Nasal Cannula  Additional Equipment:   Intra-op Plan:   Post-operative Plan:   Informed Consent:   Plan Discussed with: CRNA  Anesthesia Plan Comments:         Anesthesia Quick Evaluation

## 2021-03-21 NOTE — Op Note (Signed)
[facility] Patient Name: Anna Carr Procedure Date : 03/21/2021 MRN: [id number] Attending MD: Estill Cotta. Loletha Carrow , MD Date of Birth: 1941-09-11 CSN: [id number] Age: 80 Admit Type: Inpatient Procedure:                Small bowel enteroscopy Indications:              Abnormal video capsule endoscopy, GI bleeding                            source not documented by previous EGD and                            colonoscopy Providers:                Mallie Mussel L. Loletha Carrow, MD, Josie Dixon, RN, Tyna Jaksch Technician Referring MD:             [facility] Medicines:                Monitored Anesthesia Care, Gucagon 1.0 mg IV Complications:            No immediate complications. Estimated Blood Loss:     Estimated blood loss: none. Procedure:                Pre-Anesthesia Assessment:                           - Prior to the procedure, a History and Physical                            was performed, and patient medications and                            allergies were reviewed. The patient's tolerance of                            previous anesthesia was also reviewed. The risks                            and benefits of the procedure and the sedation                            options and risks were discussed with the patient.                            All questions were answered, and informed consent                            was obtained. Prior Anticoagulants: The patient has                            taken no previous anticoagulant or antiplatelet  agents. ASA Grade Assessment: IV - A patient with                            severe systemic disease that is a constant threat                            to life. After reviewing the risks and benefits,                            the patient was deemed in satisfactory condition to                            undergo the procedure.                           After obtaining  informed consent, the endoscope was                            passed under direct vision. Throughout the                            procedure, the patient's blood pressure, pulse, and                            oxygen saturations were monitored continuously. The                            [id number] Olympus pediatric colonoscope                            was introduced through the mouth and advanced to                            the proximal jejunum. The small bowel enteroscopy                            was performed with difficulty due to a J-shaped                            stomach which made small bowel intubation                            difficult. Also respiratory motion was challenging.                            The patient tolerated the procedure. Initially                            passed pediatric colonoscope, but much looping in                            stomach. Then passed adult colonoscope. Scope In: Scope Out: Findings:      The esophagus was normal.      The stomach  was normal.      A single angioectasia with stigmata of recent bleeding (and adjacent       fresh blood) was found in the third/fourth portion of the duodenum.       Coagulation for hemostasis using argon plasma at 0.5 liters/minute and       20 watts was successful.      Two areas of friable mucosa/probable angioectasias with no bleeding were       found in the proximal jejunum. Coagulation for hemostasis using argon       plasma at 0.5 liters/minute and 20 watts was successful. Impression:               - Normal esophagus.                           - Normal stomach.                           - A single recently bleeding angioectasia in the                            duodenum. Treated with argon plasma coagulation                            (APC).                           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                            Treated with argon plasma coagulation  (APC).                           - No specimens collected. Recommendation:           - Return patient to hospital ward for ongoing care.                           - Resume regular diet.                           - CBC tomorrow AM                           If no overt bleeding and Hgb stable tomorrow, OK to                            discharge home from GI prespective. Our service                            will see patient tomorrow. Procedure Code(s):        --- Professional ---                           [phone], Small intestinal endoscopy, enteroscopy                            beyond second portion of  duodenum, not including                            ileum; with control of bleeding (eg, injection,                            bipolar cautery, unipolar cautery, laser, heater                            probe, stapler, plasma coagulator) Diagnosis Code(s):        --- Professional ---                           K31.811, Angiodysplasia of stomach and duodenum                            with bleeding                           K55.20, Angiodysplasia of colon without hemorrhage                           K92.2, Gastrointestinal hemorrhage, unspecified                           R93.3, Abnormal findings on diagnostic imaging of                            other parts of digestive tract CPT copyright 2019 American [employer] Association. All rights reserved. The codes documented in this report are preliminary and upon coder review may  be revised to meet current compliance requirements. Inda Mcglothen L. Loletha Carrow, MD 03/21/2021 1:09:18 PM This report has been signed electronically. Number of Addenda: 0

## 2021-03-21 NOTE — Progress Notes (Signed)
PROGRESS NOTE   Anna Carr  [id number] DOB: [birth year] DOA: 03/15/2021 PCP: Raina Mina., MD  Brief Narrative:  80 year old community dwelling female ESRD HD TTS ([facility])-recent left arm AV fistula revision Dr. Donzetta Matters 03/12/2021----documented?  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 Weston Anna  Presented from [facility] 5/20+ SOB + CP + dizzy-dizziness did not resolve At [facility] had CP/troponin bump 0.36-->0.7 secondary to?  ESRD/symptomatic anemia which seem to resolve with transfusion blood Her cardiologist recommended continuation aspirin  Found to be anemic on initial work-up Prior adenomatous colon polyps 01/13/2020 ([place])-large cecal adenoma piecemeal removed-did not follow-up for recommended colonoscopy  GI consulted-colonoscopy showed diverticulosis redundant colon and 2 diminutive polyps in transverse colon status post removal  [place] performed 5/25 shows some concern for small bowel bleed and GI planning   small bowel enteroscopy performed on 5/27  Hospital-Problem based course SOB/dizzy with anemia on admission Improved with transfusions-currently not an issue ESRD TTS [place] [facility] AV fistula revision Dr. 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 AV fistula appears benign Rocephin ordered from 5/21 and discontinued 5/25-ultrasound of the area of fistula ruled out thrombosis Symptomatic anemia, FOBT  + Transfused 2 units PRBC Transfused 1 more unit 5/26 EGD colonoscopy 5/24 no overt source bleed capsule study 5/25 = active bleed proximal small bowel Small bowel enteroscopy 5/27 yielded multiple areas that were APC done duodenum injection-see report below Elevated troponin on admission Not felt to be cardiac but due to ESRD/anemia, no heparinization required Continues aspirin 81 Coreg 6.25 Hyperglycemia predominant DM TY 2 CBGs ranging 100-1 40 Continue Lantus 10 units and sliding scale Home dose is  12 units Hypokalemia Potassium improved Replacing with p.o. K 40 twice daily Recheck 8 AM mag    DVT prophylaxis: scd Code Status: full Family Communication: Long discussion daughter at the bedside 5/27 explaining plan for discharge likely in a.m. Disposition:  Status is: Inpatient  Remains inpatient appropriate because:Hemodynamically unstable, Persistent severe electrolyte disturbances and Altered mental status   Dispo: The patient is from: Home              Anticipated d/c is to: SNF              Patient currently is not medically stable to d/c.   Difficult to place patient No       Consultants:   GI  Nephro   Procedures:  Endo/COlo 5/24  Antimicrobials:  Ceftriaxone discontinued 5/25   Subjective:  Doing fair Just back from procedure No dark stool no tarry stool   Objective: Vitals:   03/21/21 1259 03/21/21 1309 03/21/21 1319 03/21/21 1341  BP: (!) 113/38 108/66 (!) 132/52 (!) 155/49  Pulse: 72 73 71 71  Resp: '14 19 19 18  '$ Temp: (!) 97.3 F (36.3 C)   97.7 F (36.5 C)  TempSrc: Tympanic   Oral  SpO2: 94% 94% 97% 100%  Weight:        Intake/Output Summary (Last 24 hours) at 03/21/2021 1526 Last data filed at 03/21/2021 1518 Gross per 24 hour  Intake 1110 ml  Output 550 ml  Net 560 ml   Filed Weights   03/20/21 0808 03/20/21 1220 03/21/21 0404  Weight: 75.2 kg 71.9 kg 73 kg    Examination:  C coherent pleasant no distress Chest clear Abdomen soft no rebound No lower extremity edema  Data Reviewed: personally reviewed    Hemoglobin is stabilized in the 8.3 range after 3 units  White count 7   Impression:               - Normal esophagus.                           - Normal stomach.                           - A single recently bleeding angioectasia in the                            duodenum. Treated with argon plasma coagulation                            (APC).                           - Two non-bleeding angioectasias in the  jejunum.                            Treated with argon plasma coagulation   Radiology Studies: No results found.   Scheduled Meds: . aspirin EC  81 mg Oral Daily  . carvedilol  6.25 mg Oral BID WC  . Chlorhexidine Gluconate Cloth  6 each Topical Q0600  . darbepoetin (ARANESP) injection - DIALYSIS  40 mcg Intravenous Q Tue-HD  . ferric citrate  210 mg Oral TID WC  . insulin aspart  0-15 Units Subcutaneous TID WC  . insulin glargine  10 Units Subcutaneous Daily  . polyethylene glycol  17 g Oral Daily  . potassium chloride  40 mEq Oral BID   Continuous Infusions: . sodium chloride Stopped (03/17/21 2300)     LOS: 6 days   Time spent: 25  Nita Sells, MD [facility] To contact the attending provider between 7A-7P or the covering provider during after hours 7P-7A, please log into the web site [email or web address] and access using universal  password for that web site. If you do not have the password, please call the hospital operator.  03/21/2021, 3:26 PM

## 2021-03-21 NOTE — Interval H&P Note (Signed)
History and Physical Interval Note:  03/21/2021 11:38 AM  Anna Carr  has presented today for surgery, with the diagnosis of anemia.  bleeding in small bowel detected on VCE.  The various methods of treatment have been discussed with the patient and family. After consideration of risks, benefits and other options for treatment, the patient has consented to  Procedure(s): ENTEROSCOPY (N/A) as a surgical intervention.  The patient's history has been reviewed, patient examined, no change in status, stable for surgery.  I have reviewed the patient's chart and labs.  Questions were answered to the patient's satisfaction.    Hgb 8.3 today Breathing comfortably.  Normal vital signs, lungs clear.  [name]

## 2021-03-21 NOTE — Progress Notes (Signed)
Patient currently in Endo daughter at bedside.

## 2021-03-22 ENCOUNTER — Inpatient Hospital Stay (HOSPITAL_COMMUNITY): Payer: Medicare Other

## 2021-03-22 DIAGNOSIS — D649 Anemia, unspecified: Secondary | ICD-10-CM | POA: Diagnosis not present

## 2021-03-22 LAB — COMPREHENSIVE METABOLIC PANEL
ALT: 9 U/L (ref 0–44)
AST: 19 U/L (ref 15–41)
Albumin: 2.8 g/dL — ABNORMAL LOW (ref 3.5–5.0)
Alkaline Phosphatase: 65 U/L (ref 38–126)
Anion gap: 10 (ref 5–15)
BUN: 32 mg/dL — ABNORMAL HIGH (ref 8–23)
CO2: 22 mmol/L (ref 22–32)
Calcium: 8.8 mg/dL — ABNORMAL LOW (ref 8.9–10.3)
Chloride: 98 mmol/L (ref 98–111)
Creatinine, Ser: 3.54 mg/dL — ABNORMAL HIGH (ref 0.44–1.00)
GFR, Estimated: 13 mL/min — ABNORMAL LOW (ref >60)
Glucose, Bld: 104 mg/dL — ABNORMAL HIGH (ref 70–99)
Potassium: 4.2 mmol/L (ref 3.5–5.1)
Sodium: 130 mmol/L — ABNORMAL LOW (ref 135–145)
Total Bilirubin: 0.5 mg/dL (ref 0.3–1.2)
Total Protein: 5.9 g/dL — ABNORMAL LOW (ref 6.5–8.1)

## 2021-03-22 LAB — GLUCOSE, CAPILLARY
Glucose-Capillary: 163 mg/dL — ABNORMAL HIGH (ref 70–99)
Glucose-Capillary: 116 mg/dL — ABNORMAL HIGH (ref 70–99)
Glucose-Capillary: 283 mg/dL — ABNORMAL HIGH (ref 70–99)

## 2021-03-22 LAB — CBC
HCT: 27 % — ABNORMAL LOW (ref 36.0–46.0)
Hemoglobin: 8.9 g/dL — ABNORMAL LOW (ref 12.0–15.0)
MCH: 30.7 pg (ref 26.0–34.0)
MCHC: 33 g/dL (ref 30.0–36.0)
MCV: 93.1 fL (ref 80.0–100.0)
Platelets: 260 10*3/uL (ref 150–400)
RBC: 2.9 MIL/uL — ABNORMAL LOW (ref 3.87–5.11)
RDW: 15.9 % — ABNORMAL HIGH (ref 11.5–15.5)
WBC: 17 10*3/uL — ABNORMAL HIGH (ref 4.0–10.5)
nRBC: 0 % (ref 0.0–0.2)

## 2021-03-22 LAB — HEMOGLOBIN AND HEMATOCRIT, BLOOD
HCT: 25.3 % — ABNORMAL LOW (ref 36.0–46.0)
Hemoglobin: 8.6 g/dL — ABNORMAL LOW (ref 12.0–15.0)

## 2021-03-22 MED ORDER — HEPARIN SODIUM (PORCINE) 1000 UNIT/ML IJ SOLN
INTRAMUSCULAR | Status: AC
  Administered 2021-03-22: 1000 [IU]
  Filled 2021-03-22: qty 4

## 2021-03-22 NOTE — Progress Notes (Addendum)
Subjective:  today seen predialysis complaint of some shortness of breath, "may have drink  to much fluids since GI procedure yesterday".  Not in distress  will increase  UF on dialysis now  Objective Vital signs in last 24 hours: Vitals:   03/21/21 1319 03/21/21 1341 03/21/21 1943 03/22/21 0422  BP: (!) 132/52 (!) 155/49 (!) 133/51 (!) 169/120  Pulse: 71 71 81 100  Resp: '19 18 18 19  '$ Temp:  97.7 F (36.5 C)  99.1 F (37.3 C)  TempSrc:  Oral  Oral  SpO2: 97% 100% 98% 96%  Weight:    74.5 kg   Weight change: -0.674 kg  Physical Exam: General: Alert elderly female NAD, seen predialysis Heart: RRR no MRG Lungs: CTA nonlabored breathing Abdomen: Bowel sounds positive normoactive, NTND Extremities:  1+ bipedal edema  dialysis Access: Right IJ TDC, left upper arm AVG positive bruit inserted (03/12/21)  Dialysis Orders: [place] TTS 3.5h 400/500 EDW 73kg 3K/2.5Ca UPF 2  R IJ TDC /revision LUA AVF with placement AVGG insert 03/12/2021 Mircera 30 q 4 weeks (last 5/17)  Problem/Plan: 1. GI bleed-GI work-up in progress ,Hx adenomatous colon polys,  5/26 video capsule study per GI="  active bleeding in the proximal small bowel and scattered areas of fresh blood and melena throughout the remainder of the small bowel. With the resultant decreased mucosal visualization',   5/27 = small bowel enteroscopy = per GI AM HGB 8.9<8.3 yesterday  Transfused  3 U prbcs  total since admit 2     .ESRD -HD TTS on schedule 3.0 l uf  5/26  Post wt 71.9 (lower edw at dc), no heparin.  Today try for 3.5 liters UF and if BP tolerates more 3.     Anemia of ESRD and GI bleed- s/p transfusion as above. Recent ESA dose as outpatient.given additional30 mcg aranespwith HD this weekFollow trends 4       HTN/volume = a.m. elevated BP , increase UF on HD today as above, BP meds carvedilol 6.25 mg twice daily , lower edw with uf  5.       Secondary hyperparathyroidism -CA, phosphorus stable, binders on hold 6. DM  2= per primary 7.  Nutrition= renal carb modified when taking p.o.'s, albumin 2.6 protein supplement when on po  Ernest Haber, PA-C [facility] Bee[facility] [phone] 03/22/2021,8:49 AM  LOS: 7 days   Labs: Basic Metabolic Panel: Recent Labs  Lab 03/17/21 0903 03/18/21 0103 03/20/21 0302 03/21/21 0239 03/22/21 0235  NA 128*   < > 127* 132* 130*  K 2.9*   < > 2.6* 3.6 4.2  CL 94*   < > 92* 96* 98  CO2 22   < > '25 29 22  '$ GLUCOSE 220*   < > 165* 110* 104*  BUN 59*   < > 25* 23 32*  CREATININE 4.16*   < > 3.40* 2.90* 3.54*  CALCIUM 8.5*   < > 8.6* 8.3* 8.8*  PHOS 3.9  --  3.7  --   --    < > = values in this interval not displayed.   Liver Function Tests: Recent Labs  Lab 03/20/21 0302 03/21/21 0239 03/22/21 0235  AST  --  14* 19  ALT  --  8 9  ALKPHOS  --  52 65  BILITOT  --  0.6 0.5  PROT  --  5.4* 5.9*  ALBUMIN 2.5* 2.6* 2.8*   No results for input(s): LIPASE, AMYLASE in the last 168 hours. No  results for input(s): AMMONIA in the last 168 hours. CBC: Recent Labs  Lab 03/17/21 0844 03/18/21 0103 03/20/21 0302 03/20/21 0746 03/20/21 1543 03/21/21 0239 03/22/21 0235  WBC 10.7*  --  7.5 7.3  --  7.6 17.0*  NEUTROABS 8.2*  --  5.2  --   --   --   --   HGB 8.5*   < > 7.1* 7.2* 8.8* 8.3* 8.9*  HCT 25.5*   < > 21.3* 21.7* 26.4* 25.0* 27.0*  MCV 92.7  --  91.4 92.3  --  92.3 93.1  PLT 166  --  189 200  --  223 260   < > = values in this interval not displayed.   Cardiac Enzymes: No results for input(s): CKTOTAL, CKMB, CKMBINDEX, TROPONINI in the last 168 hours. CBG: Recent Labs  Lab 03/20/21 1616 03/20/21 2115 03/21/21 0608 03/21/21 2018 03/22/21 0559  GLUCAP 223* 140* 107* 202* 163*    Studies/Results: No results found. Medications: . sodium chloride Stopped (03/17/21 2300)   . aspirin EC  81 mg Oral Daily  . carvedilol  6.25 mg Oral BID WC  . Chlorhexidine Gluconate Cloth  6 each Topical Q0600  . darbepoetin (ARANESP) injection -  DIALYSIS  40 mcg Intravenous Q Tue-HD  . ferric citrate  210 mg Oral TID WC  . insulin aspart  0-15 Units Subcutaneous TID WC  . insulin glargine  10 Units Subcutaneous Daily  . polyethylene glycol  17 g Oral Daily  . potassium chloride  40 mEq Oral BID

## 2021-03-22 NOTE — Progress Notes (Signed)
Subjective: Seen on dialysis today in bed; c/o shortness of breath; denies f/c/n.   Objective Vital signs in last 24 hours: Vitals:   03/21/21 1319 03/21/21 1341 03/21/21 1943 03/22/21 0422  BP: (!) 132/52 (!) 155/49 (!) 133/51 (!) 169/120  Pulse: 71 71 81 100  Resp: '19 18 18 19  '$ Temp:  97.7 F (36.5 C)  99.1 F (37.3 C)  TempSrc:  Oral  Oral  SpO2: 97% 100% 98% 96%  Weight:    74.5 kg   Weight change: -0.674 kg  Physical Exam: General: Alert elderly female NAD sitting in wheelchair Heart: RRR no MRG Lungs: CTA nonlabored breathing Abdomen: Bowel sounds positive normoactive, NTND Extremities: Trace bipedal edema  dialysis Access: Right IJ TDC, left upper arm AVG positive bruit inserted (03/12/21)  Dialysis Orders: [facility] TTS 3.5h 400/500 EDW 73kg 3K/2.5Ca UPF 2  R IJ TDC /revision LUA AVF with placement AVGG insert 03/12/2021 Mircera 30 q 4 weeks (last 5/17)  Problem/Plan: 1. GI bleed-GI work-up in progress ,Hx adenomatous colon polys, yesterday video capsule study per GI="  active bleeding in the proximal small bowel and scattered areas of fresh blood and melena throughout the remainder of the small bowel.  With the resultant decreased mucosal visualization',  Found to have a single angioectasia w/ e/o recent bleeding in the 3rd/4th portion of hte duodenum s/p argon plasma successfully tx, also friable mucosa in prox jejunum s/p argon as well. Transfused 5/26   1 unit packed red blood cells , 3 total since admit 2     .ESRD -HD TTS on schedule 3.0 l uf  5/26  Post wt 71.9 (lower edw at dc), no heparin  Seen on HD today  188/98 c/o SOB Increased UF to 3.5L net as tolerated on a 3K bath through RIJ TC  3.     Anemia of ESRD and GI bleed- s/p transfusion as above. Recent ESA dose as outpatient.givenadditional30 mcg aranespwith HD.Follow trends  4       HTN/volume = elevated before HD today partially from stress of dyspnea  BP meds carvedilol, 6.25 mg twice daily , lower  edw  as bp allows 5.       Secondary hyperparathyroidism -CA, phosphorus stable, binders on hold 6. DM 2= per primary 7.  Nutrition= renal carb modified when taking p.o.'s, albumin 2.6 protein supplement when on po   Wyeth Hoffer, Hunt Oris, MD 03/22/2021, 9:31 AM  Labs: Basic Metabolic Panel: Recent Labs  Lab 03/17/21 0903 03/18/21 0103 03/20/21 0302 03/21/21 0239 03/22/21 0235  NA 128*   < > 127* 132* 130*  K 2.9*   < > 2.6* 3.6 4.2  CL 94*   < > 92* 96* 98  CO2 22   < > '25 29 22  '$ GLUCOSE 220*   < > 165* 110* 104*  BUN 59*   < > 25* 23 32*  CREATININE 4.16*   < > 3.40* 2.90* 3.54*  CALCIUM 8.5*   < > 8.6* 8.3* 8.8*  PHOS 3.9  --  3.7  --   --    < > = values in this interval not displayed.   Liver Function Tests: Recent Labs  Lab 03/20/21 0302 03/21/21 0239 03/22/21 0235  AST  --  14* 19  ALT  --  8 9  ALKPHOS  --  52 65  BILITOT  --  0.6 0.5  PROT  --  5.4* 5.9*  ALBUMIN 2.5* 2.6* 2.8*   No results for input(s): LIPASE,  AMYLASE in the last 168 hours. No results for input(s): AMMONIA in the last 168 hours. CBC: Recent Labs  Lab 03/17/21 0844 03/18/21 0103 03/20/21 0302 03/20/21 0746 03/20/21 1543 03/21/21 0239 03/22/21 0235  WBC 10.7*  --  7.5 7.3  --  7.6 17.0*  NEUTROABS 8.2*  --  5.2  --   --   --   --   HGB 8.5*   < > 7.1* 7.2* 8.8* 8.3* 8.9*  HCT 25.5*   < > 21.3* 21.7* 26.4* 25.0* 27.0*  MCV 92.7  --  91.4 92.3  --  92.3 93.1  PLT 166  --  189 200  --  223 260   < > = values in this interval not displayed.   Cardiac Enzymes: No results for input(s): CKTOTAL, CKMB, CKMBINDEX, TROPONINI in the last 168 hours. CBG: Recent Labs  Lab 03/20/21 1616 03/20/21 2115 03/21/21 0608 03/21/21 2018 03/22/21 0559  GLUCAP 223* 140* 107* 202* 163*    Studies/Results: No results found. Medications: . sodium chloride Stopped (03/17/21 2300)   . aspirin EC  81 mg Oral Daily  . carvedilol  6.25 mg Oral BID WC  . Chlorhexidine Gluconate Cloth  6 each Topical  Q0600  . darbepoetin (ARANESP) injection - DIALYSIS  40 mcg Intravenous Q Tue-HD  . ferric citrate  210 mg Oral TID WC  . insulin aspart  0-15 Units Subcutaneous TID WC  . insulin glargine  10 Units Subcutaneous Daily  . polyethylene glycol  17 g Oral Daily  . potassium chloride  40 mEq Oral BID

## 2021-03-22 NOTE — Progress Notes (Signed)
PROGRESS NOTE   Anna Carr  [id number] DOB: 27-Nov-1940 DOA: 03/15/2021 PCP: Raina Mina., MD  Brief Narrative:  80 year old community dwelling female ESRD HD TTS ([place])-recent left arm AV fistula revision Dr. Donzetta Matters 03/12/2021----documented?  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 Weston Anna  Presented from [facility] 5/20+ SOB + CP + dizzy-dizziness did not resolve At [facility] had CP/troponin bump 0.36-->0.7 secondary to?  ESRD/symptomatic anemia which seem to resolve with transfusion blood Her cardiologist recommended continuation aspirin  Found to be anemic on initial work-up Prior adenomatous colon polyps 01/13/2020 ([place])-large cecal adenoma piecemeal removed-did not follow-up for recommended colonoscopy  GI consulted-colonoscopy showed diverticulosis redundant colon and 2 diminutive polyps in transverse colon status post removal  [place] performed 5/25 shows some concern for small bowel bleed and GI planning   small bowel enteroscopy performed on 5/27  Hospital-Problem based course Leukocytosis, Recent L AV fistula revision 03/12/21 Work up for PNA--hypoxic this am, was placed on oxygen overnight per HD nurse Get Stat PCXR to start If further fever, pan-culture--evaluate subsequently access sites SOB/dizzy with anemia on admission Improved with transfusions-currently not an issue ESRD TTS [place] L AV fistula revision Dr. 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 AV fistula benign Rocephin 5/21--> 5/25-ultrasound of the area of fistula ruled out thrombosis Symptomatic anemia, FOBT  + Transfused 2 units PRBC Transfused 1 more unit 5/26 EGD colonoscopy 5/24 no overt source bleed capsule study 5/25 = active bleed proximal small bowel Small bowel enteroscopy 5/27 yielded multiple areas that were APC done duodenum injection-see report below Further melena overnight 5/27?--watch hemoglobin--might need rpt  Capsule per Dr. Loletha Carrow [discussed personally c him] Elevated troponin on admission Not felt to be cardiac but due to ESRD/anemia, no heparinization required Continues aspirin 81 Coreg 6.25 Hyperglycemia predominant DM TY 2 CBGs ranging 107-202 Continue Lantus 10 units and sliding scale Home dose is 12 units Hypokalemia Potassium improved Replacing with p.o. K 40 twice daily     DVT prophylaxis: scd Code Status: full Family Communication: d/w family 5/28--called daughter teresa--no answer--called son Jenny Reichmann [phone] no ans Disposition:  Status is: Inpatient  Remains inpatient appropriate because:Hemodynamically unstable, Persistent severe electrolyte disturbances and Altered mental status   Dispo: The patient is from: Home              Anticipated d/c is to: SNF              Patient currently is not medically stable to d/c.   Difficult to place patient No       Consultants:   GI  Nephro   Procedures:  Endo/COlo 5/24  Antimicrobials:  Ceftriaxone discontinued 5/25   Subjective: Seen on HD unit No distress but on oxygen [which is new to her] Some cough overnight No cp fever   Objective: Vitals:   03/22/21 1030 03/22/21 1100 03/22/21 1130 03/22/21 1200  BP: (!) 130/49 (!) 111/47 (!) 128/54 (!) 98/50  Pulse:  (!) 59 68   Resp:  (!) 21 18   Temp:      TempSrc:      SpO2:      Weight:        Intake/Output Summary (Last 24 hours) at 03/22/2021 1204 Last data filed at 03/21/2021 1850 Gross per 24 hour  Intake 720 ml  Output 300 ml  Net 420 ml   Filed Weights   03/21/21 0404 03/22/21 0422 03/22/21 0915  Weight: 73 kg 74.5 kg 73.9  kg    Examination:  C coherent pleasant no distress Chest clear but limited exam as is on HD unit Abdomen soft no rebound No lower extremity edema  Data Reviewed: personally reviewed    Hemoglobin is stabilized in the 8.9 WBC 7.6-->17    Impression:               - Normal esophagus.                            - Normal stomach.                           - A single recently bleeding angioectasia in the                            duodenum. Treated with argon plasma coagulation                            (APC).                           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                            Treated with argon plasma coagulation   Radiology Studies: No results found.   Scheduled Meds: . aspirin EC  81 mg Oral Daily  . carvedilol  6.25 mg Oral BID WC  . Chlorhexidine Gluconate Cloth  6 each Topical Q0600  . darbepoetin (ARANESP) injection - DIALYSIS  40 mcg Intravenous Q Tue-HD  . ferric citrate  210 mg Oral TID WC  . heparin sodium (porcine)      . insulin aspart  0-15 Units Subcutaneous TID WC  . insulin glargine  10 Units Subcutaneous Daily  . polyethylene glycol  17 g Oral Daily  . potassium chloride  40 mEq Oral BID   Continuous Infusions: . sodium chloride Stopped (03/17/21 2300)     LOS: 7 days   Time spent:  [place], MD [facility] To contact the attending provider between 7A-7P or the covering provider during after hours 7P-7A, please log into the web site [email or web address] and access using universal  password for that web site. If you do not have the password, please call the hospital operator.  03/22/2021, 12:04 PM

## 2021-03-22 NOTE — Progress Notes (Signed)
[facility] Progress Note  Chief Complaint: Melena and acute blood loss anemia  History:  I saw Anna Carr just before she was heading to dialysis.  Nursing tells me she has continued to pass melena overnight but blood pressure and heart rate have remained stable. Anna Carr denies abdominal pain chest pain or dyspnea today.   Objective:   Current Facility-Administered Medications:  .  0.9 %  sodium chloride infusion, , Intravenous, PRN, Doran Stabler, MD, Stopped at 03/17/21 2300 .  aspirin EC tablet 81 mg, 81 mg, Oral, Daily, Danis, Estill Cotta III, MD, 81 mg at 03/20/21 1256 .  carvedilol (COREG) tablet 6.25 mg, 6.25 mg, Oral, BID WC, Danis, Jonaven Hilgers L III, MD, 6.25 mg at 03/22/21 0600 .  Chlorhexidine Gluconate Cloth 2 % PADS 6 each, 6 each, Topical, Q0600, [name], MD, 6 each at 03/22/21 0602 .  Darbepoetin Alfa (ARANESP) injection 40 mcg, 40 mcg, Intravenous, Q Tue-HD, [name], MD, 40 mcg at 03/18/21 1626 .  ferric citrate (AURYXIA) tablet 210 mg, 210 mg, Oral, TID WC, Danis, Bobi Daudelin L III, MD, 210 mg at 03/22/21 0600 .  guaiFENesin-dextromethorphan (ROBITUSSIN DM) 100-10 MG/5ML syrup 5 mL, 5 mL, Oral, Q4H PRN, [name], MD, 5 mL at 03/18/21 2321 .  insulin aspart (novoLOG) injection 0-15 Units, 0-15 Units, Subcutaneous, TID WC, Danis, Estill Cotta III, MD, 3 Units at 03/22/21 0600 .  insulin glargine (LANTUS) injection 10 Units, 10 Units, Subcutaneous, Daily, [name], MD, 10 Units at 03/21/21 1517 .  polyethylene glycol (MIRALAX / GLYCOLAX) packet 17 g, 17 g, Oral, Daily, Danis, Estill Cotta III, MD, 17 g at 03/20/21 1258 .  potassium chloride SA (KLOR-CON) CR tablet 40 mEq, 40 mEq, Oral, BID, [name], MD, 40 mEq at 03/21/21 2119  . sodium chloride Stopped (03/17/21 2300)     Vital signs in last 24 hrs: Vitals:   03/22/21 0920 03/22/21 0930  BP: (!) 188/98 (!) 136/58  Pulse: 75   Resp: 19   Temp: 97.8 F (36.6 C)   SpO2: 100%      Intake/Output Summary (Last 24 hours) at 03/22/2021 0948 Last data filed at 03/21/2021 1850 Gross per 24 hour  Intake 720 ml  Output 500 ml  Net 220 ml     Physical Exam Chronically ill-appearing as before.  Alert, conversational and in no acute distress.  Cardiac: RRR without murmurs, S1S2 heard, no peripheral edema  Pulm: clear to auscultation bilaterally, normal RR and effort noted.  Breathing comfortably on room air  Abdomen: soft, obese, no tenderness, with active bowel sounds. No guarding or palpable hepatosplenomegaly  Skin; warm and dry, no jaundice.  Pale  Recent Labs:  CBC Latest Ref Rng & Units 03/22/2021 03/21/2021 03/20/2021  WBC 4.0 - 10.5 K/uL 17.0(H) 7.6 -  Hemoglobin 12.0 - 15.0 g/dL 8.9(L) 8.3(L) 8.8(L)  Hematocrit 36.0 - 46.0 % 27.0(L) 25.0(L) 26.4(L)  Platelets 150 - 400 K/uL 260 223 -    No results for input(s): INR in the last 168 hours. CMP Latest Ref Rng & Units 03/22/2021 03/21/2021 03/20/2021  Glucose 70 - 99 mg/dL 104(H) 110(H) 165(H)  BUN 8 - 23 mg/dL 32(H) 23 25(H)  Creatinine 0.44 - 1.00 mg/dL 3.54(H) 2.90(H) 3.40(H)  Sodium 135 - 145 mmol/L 130(L) 132(L) 127(L)  Potassium 3.5 - 5.1 mmol/L 4.2 3.6 2.6(LL)  Chloride 98 - 111 mmol/L 98 96(L) 92(L)  CO2 22 - 32 mmol/L 22 29  25  Calcium 8.9 - 10.3 mg/dL 8.8(L) 8.3(L) 8.6(L)  Total Protein 6.5 - 8.1 g/dL 5.9(L) 5.4(L) -  Total Bilirubin 0.3 - 1.2 mg/dL 0.5 0.6 -  Alkaline Phos 38 - 126 U/L 65 52 -  AST 15 - 41 U/L 19 14(L) -  ALT 0 - 44 U/L 9 8 -    Assessment & Plan  Assessment: Melena and 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  A culprit lesion was seen in the distal duodenum/proximal jejunum on the video capsule study that was probably an AVM, corresponding to findings on yesterday's enteroscopy.  There were 2 additional smaller nonbleeding AVMs just distal to that culprit in the proximal jejunum, also ablated.  However, the video capsule study, the patient had fresh blood in  multiple areas in the more distal small bowel, beyond the reach of an enteroscope.  It cannot be determined if there were additional distal bleeding sources or if that was blood that had passed from the proximal source.  Therefore it is puzzling at this point about whether or not she still has bleeding and if there might be additional small bowel sources such as AVMs.  While patients often have multiple AVMs, it would be unusual for more than one of them to be overtly bleeding at the same time.  Also, her hemoglobin is stable, speaking against ongoing bleeding.  Her WBC is elevated for unclear reasons as well.  I spoke with Dr. Verlon Au, and we agreed to continue hospital observation at least until tomorrow.  He is concerned about the elevated WBC, and I am concerned about the ongoing melena.  I am hopeful that will clear up if the bleeding source treated yesterday was the only culprit. If she continues to pass melena and drops her hemoglobin, she would most likely need a repeat small bowel video capsule study.  Hemoglobin and hematocrit this evening and CBC tomorrow morning (ordered)  [name] Office: [phone]

## 2021-03-22 NOTE — Progress Notes (Signed)
cxr benign--follow tmax, melena If TMAx > 100.5, pancult  ,Verneita Griffes, MD [facility] 4:31 PM

## 2021-03-23 DIAGNOSIS — D649 Anemia, unspecified: Secondary | ICD-10-CM | POA: Diagnosis not present

## 2021-03-23 LAB — GLUCOSE, CAPILLARY
Glucose-Capillary: 182 mg/dL — ABNORMAL HIGH (ref 70–99)
Glucose-Capillary: 171 mg/dL — ABNORMAL HIGH (ref 70–99)
Glucose-Capillary: 132 mg/dL — ABNORMAL HIGH (ref 70–99)

## 2021-03-23 LAB — COMPREHENSIVE METABOLIC PANEL
ALT: 10 U/L (ref 0–44)
AST: 20 U/L (ref 15–41)
Albumin: 2.6 g/dL — ABNORMAL LOW (ref 3.5–5.0)
Alkaline Phosphatase: 64 U/L (ref 38–126)
Anion gap: 6 (ref 5–15)
BUN: 25 mg/dL — ABNORMAL HIGH (ref 8–23)
CO2: 25 mmol/L (ref 22–32)
Calcium: 8.6 mg/dL — ABNORMAL LOW (ref 8.9–10.3)
Chloride: 97 mmol/L — ABNORMAL LOW (ref 98–111)
Creatinine, Ser: 3.44 mg/dL — ABNORMAL HIGH (ref 0.44–1.00)
GFR, Estimated: 13 mL/min — ABNORMAL LOW (ref >60)
Glucose, Bld: 158 mg/dL — ABNORMAL HIGH (ref 70–99)
Potassium: 4.4 mmol/L (ref 3.5–5.1)
Sodium: 128 mmol/L — ABNORMAL LOW (ref 135–145)
Total Bilirubin: 0.4 mg/dL (ref 0.3–1.2)
Total Protein: 6.1 g/dL — ABNORMAL LOW (ref 6.5–8.1)

## 2021-03-23 LAB — CBC
HCT: 26.4 % — ABNORMAL LOW (ref 36.0–46.0)
Hemoglobin: 8.6 g/dL — ABNORMAL LOW (ref 12.0–15.0)
MCH: 30.6 pg (ref 26.0–34.0)
MCHC: 32.6 g/dL (ref 30.0–36.0)
MCV: 94 fL (ref 80.0–100.0)
Platelets: 277 10*3/uL (ref 150–400)
RBC: 2.81 MIL/uL — ABNORMAL LOW (ref 3.87–5.11)
RDW: 15.9 % — ABNORMAL HIGH (ref 11.5–15.5)
WBC: 13.1 10*3/uL — ABNORMAL HIGH (ref 4.0–10.5)
nRBC: 0 % (ref 0.0–0.2)

## 2021-03-23 MED ORDER — PANTOPRAZOLE SODIUM 40 MG PO TBEC: 40.0000 mg | DELAYED_RELEASE_TABLET | Freq: Every day | ORAL | 1 refills | Status: DC

## 2021-03-23 MED ORDER — SOLIQUA 100-33 UNT-MCG/ML ~~LOC~~ SOPN: 12.0000 [IU] | PEN_INJECTOR | Freq: Every morning | SUBCUTANEOUS | Status: DC

## 2021-03-23 NOTE — Progress Notes (Addendum)
Daily Rounding Note  03/23/2021, 10:31 AM  LOS: 8 days   SUBJECTIVE:   Chief complaint: GI bleed.  Small bowel AVMs.  Blood loss anemia.  Stool yesterday was still dark.  Small pieces of dark stool today.  No visible blood.  No abdominal pain.  Tolerating solid food.  Wondering when she can go home.  Feels clinically ready to go home.  OBJECTIVE:         Vital signs in last 24 hours:    Temp:  [97.7 F (36.5 C)-98.9 F (37.2 C)] 97.7 F (36.5 C) (05/29 0342) Pulse Rate:  [59-92] 79 (05/29 0342) Resp:  [16-21] 18 (05/29 0342) BP: (98-153)/(44-100) 153/63 (05/29 0342) SpO2:  [98 %-100 %] 100 % (05/29 0342) Weight:  [70.7 kg-73.7 kg] 73.7 kg (05/29 0342) Last BM Date: 03/22/21 Filed Weights   03/22/21 0915 03/22/21 1300 03/23/21 0342  Weight: 73.9 kg 70.7 kg 73.7 kg   General: Chronically ill-appearing comfortable. Heart: RRR. Chest: Clear bilaterally without dyspnea or cough Abdomen: Soft without tenderness.  Active bowel sounds.  No distention. Extremities: No CCE. Neuro/Psych: Appropriate.  Fluid speech.  Oriented x3.  No gross deficits or tremors.  Intake/Output from previous day: 05/28 0701 - 05/29 0700 In: -  Out: 2544   Intake/Output this shift: Total I/O In: 120 [P.O.:120] Out: -   Lab Results: Recent Labs    03/21/21 0239 03/22/21 0235 03/22/21 1750 03/23/21 0528  WBC 7.6 17.0*  --  13.1*  HGB 8.3* 8.9* 8.6* 8.6*  HCT 25.0* 27.0* 25.3* 26.4*  PLT 223 260  --  277   BMET Recent Labs    03/21/21 0239 03/22/21 0235 03/23/21 0528  NA 132* 130* 128*  K 3.6 4.2 4.4  CL 96* 98 97*  CO2 '29 22 25  '$ GLUCOSE 110* 104* 158*  BUN 23 32* 25*  CREATININE 2.90* 3.54* 3.44*  CALCIUM 8.3* 8.8* 8.6*   LFT Recent Labs    03/21/21 0239 03/22/21 0235 03/23/21 0528  PROT 5.4* 5.9* 6.1*  ALBUMIN 2.6* 2.8* 2.6*  AST 14* 19 20  ALT '8 9 10  '$ ALKPHOS 52 65 64  BILITOT 0.6 0.5 0.4   PT/INR No  results for input(s): LABPROT, INR in the last 72 hours. Hepatitis Panel No results for input(s): HEPBSAG, HCVAB, HEPAIGM, HEPBIGM in the last 72 hours.  Studies/Results: DG Chest 1 View  Result Date: 03/22/2021 CLINICAL DATA:  Pneumonia. EXAM: CHEST  1 VIEW COMPARISON:  Mar 22, 2021 FINDINGS: Dual lumen right central venous catheter in stable position. Cardiomediastinal silhouette is normal. Mediastinal contours appear intact. Bilateral pleural effusions, small. No definite focal airspace consolidation is seen radiographically. Osseous structures are without acute abnormality. Soft tissues are grossly normal. IMPRESSION: Bilateral pleural effusions, small. Electronically Signed   By: Fidela Salisbury M.D.   On: 03/22/2021 15:28    ASSESMENT:   *   GIB w melena from small bowel AVMs. Bleeding/lesion in distal duodenum/proximal jejunum on VCE confirmed by 5/27 enteroscopy.  Ablated culprit AVM as well as to smaller AVMs distal to this area in the proximal jejunum. Could possibly have other AVMs beyond reach of enteroscope.  *    Blood loss anemia.   Hgb stable 8.6 over last 24+ hours.  Hgb nadir of 6.8 11 days ago. Received 1 PRBC on 5/26.     PLAN   *   ?  Okay for discharge today? If and when she discharges, she  will need follow-up CBC within 1 week of discharge to assure that Hgb is headed in the right direction.  *   Her GI doctor is Dr. Harl Bowie of [facility], should follow with him within 4 to 6 weeks.    [name]  03/23/2021, 10:31 AM Phone [phone]  I have discussed the case with the APP, and that is the plan I formulated. I personally interviewed and examined the patient.  GI bleeding has stopped.  She is okay for home today from my perspective. I agree she should have her blood count checked in 7 to 10 days with primary care or at dialysis, and she is likely need regular IV iron with dialysis.   [name] Office: [phone]

## 2021-03-23 NOTE — Progress Notes (Signed)
D/c instruction given son is here to transport home

## 2021-03-23 NOTE — Progress Notes (Addendum)
Subjective: States tolerated dialysis yesterday feels better, eating breakfast now with 6 cups of different fluids on bedside tray/table, reminded patient of fluid restriction with ESRD and minimal UOP  Objective Vital signs in last 24 hours: Vitals:   03/22/21 1300 03/22/21 1500 03/22/21 2006 03/23/21 0342  BP: 105/62 (!) 120/100 (!) 147/44 (!) 153/63  Pulse: 92 80  79  Resp:   16 18  Temp: 97.7 F (36.5 C) 98.9 F (37.2 C) 98.4 F (36.9 C) 97.7 F (36.5 C)  TempSrc: Oral Oral Oral Oral  SpO2: 100% 98% 98% 100%  Weight: 70.7 kg   73.7 kg   Weight change: -0.626 kg  Physical Exam: General: Alert elderly female NAD , eating breakfast  Heart: RRR no MRG Lungs: CTA nonlabored breathing Abdomen: Bowel sounds positive normoactive, NTND Extremities: No bipeda edema  Dialysis Access: Right IJ TDC, left upper arm AVG positive bruit inserted (03/12/21)  Dialysis Orders: [facility] TTS 3.5h 400/500 EDW 73kg 3K/2.5Ca UPF 2  R IJ TDC /revision LUA AVF with placement AVGG insert 03/12/2021 Mircera 30 q 4 weeks (last 5/17)  Problem/Plan: 1. GI bleed-GI work-up in progress ,Hx adenomatous colon polys, video capsule study per GI="  active bleeding in the proximal small bowel and scattered areas of fresh blood and melena throughout the remainder of the small bowel. With the resultant decreased mucosal visualization',  Found to have a single angioectasia w/ e/o recent bleeding in the 3rd/4th portion of hte duodenum s/p argon plasma successfully tx, also friable mucosa in prox jejunum s/p argon as well. Transfused 5/26   1 unit packed red blood cells , 3 total since admit //a.m. Hgb 8.6 2     .ESRD -HD TTS on schedule 2.5 L uf  5/28 Post wt 70.7 (lower edw at dc), no heparin /a.m. K4.4 noted on potassium supplement history of hypokalemia monitor labs use 3K bath 3.    HTN/volume = chest x-ray yesterday showed only showing small pleural effusion, had some shortness of breath predialysis improved  with HD noted as above excess volume intake by pt. may be playing a role with dyspnea yesterday BP med =carvedilol, 6.25 mg twice daily , lower edw  as bp allows  4. Leukocytosis low-grade temp chest x-ray negative work-up per admit no current antibiotics HD access does not appear infected 5. Anemia of ESRD and GI bleed- s/p transfusion as above. Recent ESA dose as outpatient.give 40 mcg aranespwith HD.  5/24,follow trends /Hgb 8.6 6.        Secondary hyperparathyroidism -CA stable, phosphorus stable with, binders on hold 7. DM 2= per primary 8. Nutrition= current diet carb modified needs changed to fluid restrictions with potassium and phosphorus level okay to continue her modified  Ernest Haber, PA-C [place] [phone] 03/23/2021,7:44 AM  LOS: 8 days   I have seen and examined this patient and agree with the plan of care. Next HD Tues; no absolute indication for RRT today.   Dwana Melena, MD 03/23/2021, 1:16 PM  Labs: Basic Metabolic Panel: Recent Labs  Lab 03/17/21 0903 03/18/21 0103 03/20/21 0302 03/21/21 0239 03/22/21 0235  NA 128*   < > 127* 132* 130*  K 2.9*   < > 2.6* 3.6 4.2  CL 94*   < > 92* 96* 98  CO2 22   < > '25 29 22  '$ GLUCOSE 220*   < > 165* 110* 104*  BUN 59*   < > 25* 23 32*  CREATININE 4.16*   < >  3.40* 2.90* 3.54*  CALCIUM 8.5*   < > 8.6* 8.3* 8.8*  PHOS 3.9  --  3.7  --   --    < > = values in this interval not displayed.   Liver Function Tests: Recent Labs  Lab 03/20/21 0302 03/21/21 0239 03/22/21 0235  AST  --  14* 19  ALT  --  8 9  ALKPHOS  --  52 65  BILITOT  --  0.6 0.5  PROT  --  5.4* 5.9*  ALBUMIN 2.5* 2.6* 2.8*   No results for input(s): LIPASE, AMYLASE in the last 168 hours. No results for input(s): AMMONIA in the last 168 hours. CBC: Recent Labs  Lab 03/17/21 0844 03/18/21 0103 03/20/21 0302 03/20/21 0746 03/20/21 1543 03/21/21 0239 03/22/21 0235 03/22/21 1750  WBC 10.7*  --  7.5 7.3  --  7.6  17.0*  --   NEUTROABS 8.2*  --  5.2  --   --   --   --   --   HGB 8.5*   < > 7.1* 7.2*   < > 8.3* 8.9* 8.6*  HCT 25.5*   < > 21.3* 21.7*   < > 25.0* 27.0* 25.3*  MCV 92.7  --  91.4 92.3  --  92.3 93.1  --   PLT 166  --  189 200  --  223 260  --    < > = values in this interval not displayed.   Cardiac Enzymes: No results for input(s): CKTOTAL, CKMB, CKMBINDEX, TROPONINI in the last 168 hours. CBG: Recent Labs  Lab 03/21/21 2018 03/22/21 0559 03/22/21 1548 03/22/21 2136 03/23/21 0603  GLUCAP 202* 163* 116* 283* 182*    Studies/Results: DG Chest 1 View  Result Date: 03/22/2021 CLINICAL DATA:  Pneumonia. EXAM: CHEST  1 VIEW COMPARISON:  Mar 22, 2021 FINDINGS: Dual lumen right central venous catheter in stable position. Cardiomediastinal silhouette is normal. Mediastinal contours appear intact. Bilateral pleural effusions, small. No definite focal airspace consolidation is seen radiographically. Osseous structures are without acute abnormality. Soft tissues are grossly normal. IMPRESSION: Bilateral pleural effusions, small. Electronically Signed   By: Fidela Salisbury M.D.   On: 03/22/2021 15:28   Medications: . sodium chloride Stopped (03/17/21 2300)   . aspirin EC  81 mg Oral Daily  . carvedilol  6.25 mg Oral BID WC  . Chlorhexidine Gluconate Cloth  6 each Topical Q0600  . darbepoetin (ARANESP) injection - DIALYSIS  40 mcg Intravenous Q Tue-HD  . ferric citrate  210 mg Oral TID WC  . insulin aspart  0-15 Units Subcutaneous TID WC  . insulin glargine  10 Units Subcutaneous Daily  . polyethylene glycol  17 g Oral Daily  . potassium chloride  40 mEq Oral BID

## 2021-03-23 NOTE — Discharge Summary (Signed)
Physician Discharge Summary  Anna Carr [id number] DOB: [birth year] DOA: 03/15/2021  PCP: Raina Mina., MD  Admit date: 03/15/2021 Discharge date: 03/23/2021  Time spent: 24 minutes  Recommendations for Outpatient Follow-up:  1. Protonix for 1 month and then stop 2. Follow-up outpatient setting with GI 3. Note dosage changes of various meds-have stopped Lasix potassium and need labs in a week to check chemistries because ESRD 4. Needs iron levels CBC in 1 week and may need IV iron  Discharge Diagnoses:  MAIN problem for hospitalization   GI bleed from small bowel  Please see below for itemized issues addressed in HOpsital- refer to other progress notes for clarity if needed  Discharge Condition: Improved  Diet recommendation: Renal diabetic low-salt  Filed Weights   03/22/21 0915 03/22/21 1300 03/23/21 0342  Weight: 73.9 kg 70.7 kg 73.7 kg    History of present illness:  80 year old community dwelling female ESRD HD TTS ([place])-recent left arm AV fistula revision Dr. Donzetta Matters 03/12/2021----documented?  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 Weston Anna  Presented from [facility] 5/20+ SOB + CP + dizzy-dizziness did not resolve At [facility] had CP/troponin bump 0.36-->0.7 secondary to?  ESRD/symptomatic anemia which seem to resolve with transfusion blood Her cardiologist recommended continuation aspirin  Found to be anemic on initial work-up Prior adenomatous colon polyps 01/13/2020 ([place])-large cecal adenoma piecemeal removed-did not follow-up for recommended colonoscopy  GI consulted-colonoscopy showed diverticulosis redundant colon and 2 diminutive polyps in transverse colon status post removal  G[place]ens performed 5/25 shows some concern for small bowel bleed and GI planning   small bowel enteroscopy performed on 5/27  Hospital Course:  Leukocytosis, Recent L AV fistula revision 03/12/21 Work up for  PNA--hypoxic earlier in admission do not feel she had pneumonia however probable atelectasis leukocytosis resolved She is not using oxygen on discharge and is stable to go SOB/dizzy with anemia on admission Improved with transfusions-currently not an issue ESRD TTS [place] L AV fistula revision Dr. 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 AV fistula benign Rocephin 5/21--> 5/25-ultrasound of the area of fistula ruled out thrombosis Lasix discontinued this admission-challenge EDW in the outpatient setting per nephrology Symptomatic anemia, FOBT  + Transfused 2 units PRBC Transfused 1 more unit 5/26 EGD colonoscopy 5/24 no overt source bleed capsule study 5/25 = active bleed proximal small bowel Small bowel enteroscopy 5/27 yielded multiple areas that were APC done duodenum injection-see report below Further melena overnight 5/27?--Hemoglobin however stabilized in the 8.6 range-does not require further monitoring and is eating and drinking without issue Close outpatient follow-up for labs etc. as above Elevated troponin on admission Not felt to be cardiac but due to ESRD/anemia, no heparinization required Continues aspirin 81 Coreg 6.25 Hyperglycemia predominant DM TY 2 CBGs controlled during hospital stay Continue Lantus 10 units and Amaryl on discharge Hypokalemia Potassium resolved-stop replacement stop potassium and Lasix replacements    Discharge Exam: Vitals:   03/22/21 2006 03/23/21 0342  BP: (!) 147/44 (!) 153/63  Pulse:  79  Resp: 16 18  Temp: 98.4 F (36.9 C) 97.7 F (36.5 C)  SpO2: 98% 100%    Subj on day of d/c   Awake coherent sitting up no distress some indigestion but no pain  Indigestion is common for her No fever no chills  General Exam on discharge  Awake coherent S1-S2 no murmur chest clear no added sound abdomen soft no rebound no lower extremity edema  Discharge Instructions  Discharge Instructions    Diet - low sodium heart healthy    Complete by: As directed    Discharge instructions   Complete by: As directed    Review medications carefully as several have changed Please report any right red blood or vomiting of blood like products of coffee grounds to your doctor although I do not think this will occur Suggest a low-dose acid reducer which I have called into the [facility] and find a 30 Note from dosage changes of various meds including your Lasix amlodipine etc. etc. Please get labs at either nephrology clinic and or get work-up at your primary care physician in 1 week to 10 days and we can adjust your meds and give you iron if needed   Increase activity slowly   Complete by: As directed    No wound care   Complete by: As directed      Allergies as of 03/23/2021   No Known Allergies     Medication List    STOP taking these medications   acetaminophen 500 MG tablet Commonly known as: TYLENOL   amLODipine 10 MG tablet Commonly known as: NORVASC   Docusate Sodium 100 MG capsule   furosemide 80 MG tablet Commonly known as: LASIX   potassium chloride 10 MEQ tablet Commonly known as: KLOR-CON     TAKE these medications   ascorbic acid 1000 MG tablet Commonly known as: VITAMIN C Take 1,000 mg by mouth daily in the afternoon.   aspirin 81 MG EC tablet Take 81 mg by mouth in the morning.   Biotin 5000 MCG Tabs Take 5,000 mcg by mouth daily in the afternoon.   carboxymethylcellulose 0.5 % Soln Commonly known as: REFRESH PLUS Place 1 drop into both eyes 3 (three) times daily as needed (dry eyes).   carvedilol 6.25 MG tablet Commonly known as: COREG Take 6.25 mg by mouth 2 (two) times daily.   ferric citrate 1 GM 210 MG(Fe) tablet Commonly known as: AURYXIA Take 210 mg by mouth 3 (three) times daily with meals.   Fiber 625 MG Tabs Take 625 mg by mouth every evening.   Fish Oil 1000 MG Caps Take 1,000 mg by mouth in the morning and at bedtime.   glimepiride 4 MG tablet Commonly known as:  AMARYL Take 4 mg by mouth 2 (two) times daily.   HYDROcodone-acetaminophen 5-325 MG tablet Commonly known as: Norco Take 1 tablet by mouth every 6 (six) hours as needed for moderate pain.   lidocaine-prilocaine cream Commonly known as: EMLA Apply 1 application topically as needed (prior to fisula access).   pantoprazole 40 MG tablet Commonly known as: Protonix Take 1 tablet (40 mg total) by mouth daily.   polyethylene glycol 17 g packet Commonly known as: MIRALAX / GLYCOLAX Take 17 g by mouth daily as needed for moderate constipation.   PROBIOTIC ACIDOPHILUS PO Take 1 capsule by mouth in the morning.   QC Vitamin D3 125 MCG (5000 UT) Tabs Generic drug: Cholecalciferol Take 5,000 Units by mouth in the morning.   Soliqua 100-33 UNT-MCG/ML Sopn Generic drug: Insulin Glargine-Lixisenatide Inject 12 Units into the skin in the morning.   VITAMIN B-12 PO Take 1 capsule by mouth every evening.   Vitamin E 400 units Tabs Take 800 Units by mouth in the morning.      No Known Allergies  Follow-up Information    Shirleen Schirmer, MD. Schedule an appointment as soon as possible for a visit.   Specialty: Gastroenterology Why:  for gastro follow up.   Contact information: [address] [place] [address] [phone]                The results of significant diagnostics from this hospitalization (including imaging, microbiology, ancillary and laboratory) are listed below for reference.    Significant Diagnostic Studies: DG Chest 1 View  Result Date: 03/22/2021 CLINICAL DATA:  Pneumonia. EXAM: CHEST  1 VIEW COMPARISON:  Mar 22, 2021 FINDINGS: Dual lumen right central venous catheter in stable position. Cardiomediastinal silhouette is normal. Mediastinal contours appear intact. Bilateral pleural effusions, small. No definite focal airspace consolidation is seen radiographically. Osseous structures are without acute abnormality. Soft tissues are grossly normal.  IMPRESSION: Bilateral pleural effusions, small. Electronically Signed   By: Fidela Salisbury M.D.   On: 03/22/2021 15:28   VAS [place] UPPER EXTREMITY VENOUS DUPLEX  Result Date: 03/16/2021 UPPER VENOUS STUDY  Patient Name:  Anna Carr  Date of Exam:   03/16/2021 Medical Rec #: [id number]          Accession #:    [id number] Date of Birth: 1941-02-10          Patient Gender: F Patient Age:   82Y Exam Location:  [facility] Procedure:      VAS [place] UPPER EXTREMITY VENOUS DUPLEX Referring Phys: [id number] Hartley T TU --------------------------------------------------------------------------------  Indications: left upper extremity pain and edema s/p LUE AVG placement 03/12/21 Limitations: Bandages and open wound. Comparison Study: No prior study Performing Technologist: Maudry Mayhew MHA, RDMS, RVT, RDCS  Examination Guidelines: A complete evaluation includes B-mode imaging, spectral Doppler, color Doppler, and power Doppler as needed of all accessible portions of each vessel. Bilateral testing is considered an integral part of a complete examination. Limited examinations for reoccurring indications may be performed as noted.  Right Findings: +----------+------------+---------+-----------+----------+---------------------+ RIGHT     CompressiblePhasicitySpontaneousProperties       Summary        +----------+------------+---------+-----------+----------+---------------------+ Subclavian                                           Unable to visualize                                                        due to bandaging    +----------+------------+---------+-----------+----------+---------------------+  Left Findings: +----------+------------+---------+-----------+----------+--------------+ LEFT      CompressiblePhasicitySpontaneousProperties   Summary     +----------+------------+---------+-----------+----------+--------------+ IJV           Full       Yes       Yes                              +----------+------------+---------+-----------+----------+--------------+ Subclavian    Full       Yes       Yes                             +----------+------------+---------+-----------+----------+--------------+ Axillary      Full       Yes       Yes                             +----------+------------+---------+-----------+----------+--------------+  Brachial      Full       Yes       Yes                             +----------+------------+---------+-----------+----------+--------------+ Radial        Full                                                 +----------+------------+---------+-----------+----------+--------------+ Ulnar         Full                                                 +----------+------------+---------+-----------+----------+--------------+ Cephalic                                            Not visualized +----------+------------+---------+-----------+----------+--------------+ Basilic       Full                                                 +----------+------------+---------+-----------+----------+--------------+  Summary:  Left: No evidence of deep vein thrombosis in the upper extremity. No evidence of superficial vein thrombosis in the upper extremity.  *See table(s) above for measurements and observations.  Diagnosing physician: Deitra Mayo MD Electronically signed by Deitra Mayo MD on 03/16/2021 at 1:19:35 PM.    Final     Microbiology: Recent Results (from the past 240 hour(s))  SARS CORONAVIRUS 2 (TAT 6-24 HRS) Nasopharyngeal     Status: None   Collection Time: 03/15/21  9:53 PM   Specimen: Nasopharyngeal  Result Value Ref Range Status   SARS Coronavirus 2 NEGATIVE NEGATIVE Final    Comment: (NOTE) SARS-CoV-2 target nucleic acids are NOT DETECTED.  The SARS-CoV-2 RNA is generally detectable in upper and lower respiratory specimens during the acute phase of infection.  Negative results do not preclude SARS-CoV-2 infection, do not rule out co-infections with other pathogens, and should not be used as the sole basis for treatment or other patient management decisions. Negative results must be combined with clinical observations, patient history, and epidemiological information. The expected result is Negative.  Fact Sheet for Patients: SugarRoll.be  Fact Sheet for Healthcare Providers: https://www.woods-mathews.com/  This test is not yet approved or cleared by the [place] FDA and  has been authorized for detection and/or diagnosis of SARS-CoV-2 by FDA under an Emergency Use Authorization (EUA). This EUA will remain  in effect (meaning this test can be used) for the duration of the COVID-19 declaration under Se ction 564(b)(1) of the Act, 21 U.S.C. section 360bbb-3(b)(1), unless the authorization is terminated or revoked sooner.  Performed at [facility], [place] [address] [place], [place] [address]   MRSA PCR Screening     Status: None   Collection Time: 03/15/21 10:10 PM   Specimen: Nasopharyngeal  Result Value Ref Range Status   MRSA by PCR NEGATIVE NEGATIVE Final    Comment:  The GeneXpert MRSA Assay (FDA approved for NASAL specimens only), is one component of a comprehensive MRSA colonization surveillance program. It is not intended to diagnose MRSA infection nor to guide or monitor treatment for MRSA infections. Performed at [facility], [place] [address] [place], [place] [address]   Culture, blood (routine x 2)     Status: None   Collection Time: 03/16/21 12:33 AM   Specimen: BLOOD LEFT HAND  Result Value Ref Range Status   Specimen Description BLOOD LEFT HAND  Final   Special Requests   Final    BOTTLES DRAWN AEROBIC ONLY Blood Culture results may not be optimal due to an inadequate volume of blood received in culture bottles   Culture   Final    NO GROWTH 5  DAYS Performed at [facility], [place] [address], [place], [place] [address]    Report Status 03/21/2021 FINAL  Final  Culture, blood (routine x 2)     Status: None   Collection Time: 03/16/21 12:46 AM   Specimen: BLOOD RIGHT HAND  Result Value Ref Range Status   Specimen Description BLOOD RIGHT HAND  Final   Special Requests   Final    BOTTLES DRAWN AEROBIC ONLY Blood Culture results may not be optimal due to an inadequate volume of blood received in culture bottles   Culture   Final    NO GROWTH 5 DAYS Performed at [facility], [place] [address], [place], [place] [address]    Report Status 03/21/2021 FINAL  Final     Labs: Basic Metabolic Panel: Recent Labs  Lab 03/17/21 [phone] 03/18/21 0103 03/20/21 0302 03/20/21 0746 03/21/21 0239 03/22/21 0235 03/23/21 0528  NA 128* 132* 127*  --  132* 130* 128*  K 2.9* 3.2* 2.6*  --  3.6 4.2 4.4  CL 94* 94* 92*  --  96* 98 97*  CO2 '22 27 25  '$ --  '29 22 25  '$ GLUCOSE 220* 98 165*  --  110* 104* 158*  BUN 59* 27* 25*  --  23 32* 25*  CREATININE 4.16* 3.01* 3.40*  --  2.90* 3.54* 3.44*  CALCIUM 8.5* 8.5* 8.6*  --  8.3* 8.8* 8.6*  MG  --   --   --  1.9 1.9  --   --   PHOS 3.9  --  3.7  --   --   --   --    Liver Function Tests: Recent Labs  Lab 03/17/21 0903 03/20/21 0302 03/21/21 0239 03/22/21 0235 03/23/21 0528  AST  --   --  14* 19 20  ALT  --   --  '8 9 10  '$ ALKPHOS  --   --  52 65 64  BILITOT  --   --  0.6 0.5 0.4  PROT  --   --  5.4* 5.9* 6.1*  ALBUMIN 2.7* 2.5* 2.6* 2.8* 2.6*   No results for input(s): LIPASE, AMYLASE in the last 168 hours. No results for input(s): AMMONIA in the last 168 hours. CBC: Recent Labs  Lab 03/17/21 0844 03/18/21 0103 03/20/21 0302 03/20/21 0746 03/20/21 1543 03/21/21 0239 03/22/21 0235 03/22/21 1750 03/23/21 0528  WBC 10.7*  --  7.5 7.3  --  7.6 17.0*  --  13.1*  NEUTROABS 8.2*  --  5.2  --   --   --   --   --   --   HGB 8.5*   < > 7.1* 7.2* 8.8* 8.3* 8.9* 8.6*  8.6*  HCT  25.5*   < > 21.3* 21.7* 26.4* 25.0* 27.0* 25.3* 26.4*  MCV 92.7  --  91.4 92.3  --  92.3 93.1  --  94.0  PLT 166  --  189 200  --  223 260  --  277   < > = values in this interval not displayed.   Cardiac Enzymes: No results for input(s): CKTOTAL, CKMB, CKMBINDEX, TROPONINI in the last 168 hours. BNP: BNP (last 3 results) No results for input(s): BNP in the last 8760 hours.  ProBNP (last 3 results) No results for input(s): PROBNP in the last 8760 hours.  CBG: Recent Labs  Lab 03/22/21 0559 03/22/21 1548 03/22/21 2136 03/23/21 0603 03/23/21 1141  GLUCAP 163* 116* 283* 182* 171*       Signed:  Nita Sells MD   [facility] 03/23/2021, 2:02 PM

## 2021-03-24 ENCOUNTER — Encounter (HOSPITAL_COMMUNITY): Payer: Self-pay | Admitting: Gastroenterology

## 2021-03-24 LAB — BPAM RBC
Blood Product Expiration Date: 202206192359
Blood Product Expiration Date: 202206192359
Blood Product Expiration Date: 202206192359
ISSUE DATE / TIME: 202205261016
Unit Type and Rh: 6200
Unit Type and Rh: 6200
Unit Type and Rh: 6200

## 2021-03-24 LAB — TYPE AND SCREEN
ABO/RH(D): A POS
Antibody Screen: NEGATIVE
Unit division: 0
Unit division: 0
Unit division: 0

## 2021-03-29 ENCOUNTER — Inpatient Hospital Stay (HOSPITAL_COMMUNITY)
Admission: EM | Admit: 2021-03-29 | Discharge: 2021-04-01 | DRG: 371 | Disposition: A | Discharge status: Home / Self Care | Payer: Medicare Other | Attending: Internal Medicine | Admitting: Internal Medicine

## 2021-03-29 ENCOUNTER — Encounter (HOSPITAL_COMMUNITY): Payer: Self-pay | Admitting: Emergency Medicine

## 2021-03-29 ENCOUNTER — Other Ambulatory Visit: Payer: Self-pay

## 2021-03-29 ENCOUNTER — Emergency Department (HOSPITAL_COMMUNITY): Payer: Medicare Other

## 2021-03-29 DIAGNOSIS — N2581 Secondary hyperparathyroidism of renal origin: Secondary | ICD-10-CM | POA: Diagnosis present

## 2021-03-29 DIAGNOSIS — I214 Non-ST elevation (NSTEMI) myocardial infarction: Secondary | ICD-10-CM

## 2021-03-29 DIAGNOSIS — E1169 Type 2 diabetes mellitus with other specified complication: Secondary | ICD-10-CM

## 2021-03-29 DIAGNOSIS — Z992 Dependence on renal dialysis: Secondary | ICD-10-CM

## 2021-03-29 DIAGNOSIS — Z79899 Other long term (current) drug therapy: Secondary | ICD-10-CM

## 2021-03-29 DIAGNOSIS — Z7984 Long term (current) use of oral hypoglycemic drugs: Secondary | ICD-10-CM

## 2021-03-29 DIAGNOSIS — Z833 Family history of diabetes mellitus: Secondary | ICD-10-CM

## 2021-03-29 DIAGNOSIS — A0472 Enterocolitis due to Clostridium difficile, not specified as recurrent: Principal | ICD-10-CM | POA: Diagnosis present

## 2021-03-29 DIAGNOSIS — Z7982 Long term (current) use of aspirin: Secondary | ICD-10-CM

## 2021-03-29 DIAGNOSIS — D631 Anemia in chronic kidney disease: Secondary | ICD-10-CM | POA: Diagnosis present

## 2021-03-29 DIAGNOSIS — R109 Unspecified abdominal pain: Secondary | ICD-10-CM | POA: Diagnosis present

## 2021-03-29 DIAGNOSIS — K8020 Calculus of gallbladder without cholecystitis without obstruction: Secondary | ICD-10-CM | POA: Diagnosis present

## 2021-03-29 DIAGNOSIS — Z87891 Personal history of nicotine dependence: Secondary | ICD-10-CM

## 2021-03-29 DIAGNOSIS — J449 Chronic obstructive pulmonary disease, unspecified: Secondary | ICD-10-CM | POA: Diagnosis present

## 2021-03-29 DIAGNOSIS — Z86010 Personal history of colonic polyps: Secondary | ICD-10-CM

## 2021-03-29 DIAGNOSIS — R140 Abdominal distension (gaseous): Secondary | ICD-10-CM | POA: Diagnosis not present

## 2021-03-29 DIAGNOSIS — N186 End stage renal disease: Secondary | ICD-10-CM | POA: Diagnosis present

## 2021-03-29 DIAGNOSIS — Z20822 Contact with and (suspected) exposure to covid-19: Secondary | ICD-10-CM | POA: Diagnosis present

## 2021-03-29 DIAGNOSIS — E876 Hypokalemia: Secondary | ICD-10-CM | POA: Diagnosis present

## 2021-03-29 DIAGNOSIS — K5900 Constipation, unspecified: Secondary | ICD-10-CM | POA: Diagnosis not present

## 2021-03-29 DIAGNOSIS — I248 Other forms of acute ischemic heart disease: Secondary | ICD-10-CM | POA: Diagnosis present

## 2021-03-29 DIAGNOSIS — H9190 Unspecified hearing loss, unspecified ear: Secondary | ICD-10-CM | POA: Diagnosis present

## 2021-03-29 DIAGNOSIS — E871 Hypo-osmolality and hyponatremia: Secondary | ICD-10-CM | POA: Diagnosis present

## 2021-03-29 DIAGNOSIS — R7989 Other specified abnormal findings of blood chemistry: Secondary | ICD-10-CM | POA: Diagnosis present

## 2021-03-29 DIAGNOSIS — Z794 Long term (current) use of insulin: Secondary | ICD-10-CM

## 2021-03-29 DIAGNOSIS — E785 Hyperlipidemia, unspecified: Secondary | ICD-10-CM | POA: Diagnosis present

## 2021-03-29 DIAGNOSIS — I132 Hypertensive heart and chronic kidney disease with heart failure and with stage 5 chronic kidney disease, or end stage renal disease: Secondary | ICD-10-CM | POA: Diagnosis present

## 2021-03-29 DIAGNOSIS — R778 Other specified abnormalities of plasma proteins: Secondary | ICD-10-CM | POA: Diagnosis present

## 2021-03-29 DIAGNOSIS — E1122 Type 2 diabetes mellitus with diabetic chronic kidney disease: Secondary | ICD-10-CM | POA: Diagnosis present

## 2021-03-29 DIAGNOSIS — I5032 Chronic diastolic (congestive) heart failure: Secondary | ICD-10-CM | POA: Diagnosis present

## 2021-03-29 DIAGNOSIS — Z8249 Family history of ischemic heart disease and other diseases of the circulatory system: Secondary | ICD-10-CM

## 2021-03-29 DIAGNOSIS — I083 Combined rheumatic disorders of mitral, aortic and tricuspid valves: Secondary | ICD-10-CM | POA: Diagnosis present

## 2021-03-29 DIAGNOSIS — I10 Essential (primary) hypertension: Secondary | ICD-10-CM | POA: Diagnosis present

## 2021-03-29 LAB — CBC WITH DIFFERENTIAL/PLATELET
Abs Immature Granulocytes: 0.06 10*3/uL (ref 0.00–0.07)
Basophils Absolute: 0 10*3/uL (ref 0.0–0.1)
Basophils Relative: 0 %
Eosinophils Absolute: 0.1 10*3/uL (ref 0.0–0.5)
Eosinophils Relative: 1 %
HCT: 24.4 % — ABNORMAL LOW (ref 36.0–46.0)
Hemoglobin: 7.8 g/dL — ABNORMAL LOW (ref 12.0–15.0)
Immature Granulocytes: 1 %
Lymphocytes Relative: 15 %
Lymphs Abs: 1.3 10*3/uL (ref 0.7–4.0)
MCH: 30.1 pg (ref 26.0–34.0)
MCHC: 32 g/dL (ref 30.0–36.0)
MCV: 94.2 fL (ref 80.0–100.0)
Monocytes Absolute: 0.9 10*3/uL (ref 0.1–1.0)
Monocytes Relative: 10 %
Neutro Abs: 6.2 10*3/uL (ref 1.7–7.7)
Neutrophils Relative %: 73 %
Platelets: 372 10*3/uL (ref 150–400)
RBC: 2.59 MIL/uL — ABNORMAL LOW (ref 3.87–5.11)
RDW: 16.2 % — ABNORMAL HIGH (ref 11.5–15.5)
WBC: 8.6 10*3/uL (ref 4.0–10.5)
nRBC: 0 % (ref 0.0–0.2)

## 2021-03-29 LAB — PROTIME-INR
INR: 1 (ref 0.8–1.2)
Prothrombin Time: 13.1 seconds (ref 11.4–15.2)

## 2021-03-29 LAB — CBG MONITORING, ED: Glucose-Capillary: 166 mg/dL — ABNORMAL HIGH (ref 70–99)

## 2021-03-29 NOTE — ED Provider Notes (Signed)
Emergency Medicine Provider Triage Evaluation Note  Anna Carr , a 80 y.o. female  was evaluated in triage.  Daughter provides history. Pt complains of abdominal pain and nausea x 2 days. Stool is black and watery. Hemoglobin on 6/2 hemoglobin was 7.1 at dialysis. She is dialysis T/TH/S, last went today. Sugars have been low today in the 80s  Review of Systems  Positive: Abdominal pain, nausea, diarrhea, chills,  generalized weakness, chest pain, shortness of breath Negative: Fever, urinary symptoms  Physical Exam  BP (!) 134/49 (BP Location: Right Arm)   Pulse 84   Temp 98.9 F (37.2 C) (Oral)   Resp 16   SpO2 100%  Gen:   Awake, no distress   Resp:  Normal effort  MSK:   Moves extremities without difficulty  Other:  Skin is jaundiced. No abdominal tenderness  Medical Decision Making  Medically screening exam initiated at 10:29 PM.  Appropriate orders placed.  Anna Carr was informed that the remainder of the evaluation will be completed by another provider, this initial triage assessment does not replace that evaluation, and the importance of remaining in the ED until their evaluation is complete.  Labs ordered including type and screen and INR.  Patient tells me she has chest tightness that started today.  She did not tell this to the staff when she was first checking in to the ER.  Will troponin and EKG to work-up.   Portions of this note were generated with [employer]. Dictation errors may occur despite best attempts at proofreading.    Barrie Folk, PA-C 03/29/21 2249    Ripley Fraise, MD 03/30/21 [phone]

## 2021-03-29 NOTE — ED Triage Notes (Signed)
Pt reports low hgb, c/o lower abdominal pain and black tarry stools.  Pt reports being weak. Sugars have been lower than normal for her today.   Pt did received dialysis today but was told to come.

## 2021-03-30 ENCOUNTER — Emergency Department (HOSPITAL_COMMUNITY): Payer: Medicare Other

## 2021-03-30 ENCOUNTER — Other Ambulatory Visit: Payer: Self-pay

## 2021-03-30 ENCOUNTER — Encounter (HOSPITAL_COMMUNITY): Payer: Self-pay | Admitting: Internal Medicine

## 2021-03-30 DIAGNOSIS — I248 Other forms of acute ischemic heart disease: Secondary | ICD-10-CM

## 2021-03-30 DIAGNOSIS — R140 Abdominal distension (gaseous): Secondary | ICD-10-CM | POA: Diagnosis present

## 2021-03-30 DIAGNOSIS — I340 Nonrheumatic mitral (valve) insufficiency: Secondary | ICD-10-CM | POA: Diagnosis not present

## 2021-03-30 DIAGNOSIS — R079 Chest pain, unspecified: Secondary | ICD-10-CM | POA: Diagnosis not present

## 2021-03-30 DIAGNOSIS — Z20822 Contact with and (suspected) exposure to covid-19: Secondary | ICD-10-CM | POA: Diagnosis present

## 2021-03-30 DIAGNOSIS — N186 End stage renal disease: Secondary | ICD-10-CM | POA: Diagnosis present

## 2021-03-30 DIAGNOSIS — I083 Combined rheumatic disorders of mitral, aortic and tricuspid valves: Secondary | ICD-10-CM | POA: Diagnosis present

## 2021-03-30 DIAGNOSIS — K8020 Calculus of gallbladder without cholecystitis without obstruction: Secondary | ICD-10-CM

## 2021-03-30 DIAGNOSIS — I5032 Chronic diastolic (congestive) heart failure: Secondary | ICD-10-CM | POA: Diagnosis present

## 2021-03-30 DIAGNOSIS — E871 Hypo-osmolality and hyponatremia: Secondary | ICD-10-CM | POA: Diagnosis present

## 2021-03-30 DIAGNOSIS — I132 Hypertensive heart and chronic kidney disease with heart failure and with stage 5 chronic kidney disease, or end stage renal disease: Secondary | ICD-10-CM | POA: Diagnosis present

## 2021-03-30 DIAGNOSIS — Z87891 Personal history of nicotine dependence: Secondary | ICD-10-CM | POA: Diagnosis not present

## 2021-03-30 DIAGNOSIS — D631 Anemia in chronic kidney disease: Secondary | ICD-10-CM | POA: Diagnosis present

## 2021-03-30 DIAGNOSIS — Z79899 Other long term (current) drug therapy: Secondary | ICD-10-CM | POA: Diagnosis not present

## 2021-03-30 DIAGNOSIS — E785 Hyperlipidemia, unspecified: Secondary | ICD-10-CM | POA: Diagnosis present

## 2021-03-30 DIAGNOSIS — R778 Other specified abnormalities of plasma proteins: Secondary | ICD-10-CM | POA: Diagnosis not present

## 2021-03-30 DIAGNOSIS — E876 Hypokalemia: Secondary | ICD-10-CM | POA: Diagnosis present

## 2021-03-30 DIAGNOSIS — Z992 Dependence on renal dialysis: Secondary | ICD-10-CM | POA: Diagnosis not present

## 2021-03-30 DIAGNOSIS — A0472 Enterocolitis due to Clostridium difficile, not specified as recurrent: Secondary | ICD-10-CM | POA: Diagnosis present

## 2021-03-30 DIAGNOSIS — J449 Chronic obstructive pulmonary disease, unspecified: Secondary | ICD-10-CM | POA: Diagnosis present

## 2021-03-30 DIAGNOSIS — Z794 Long term (current) use of insulin: Secondary | ICD-10-CM | POA: Diagnosis not present

## 2021-03-30 DIAGNOSIS — Z833 Family history of diabetes mellitus: Secondary | ICD-10-CM | POA: Diagnosis not present

## 2021-03-30 DIAGNOSIS — E1122 Type 2 diabetes mellitus with diabetic chronic kidney disease: Secondary | ICD-10-CM | POA: Diagnosis present

## 2021-03-30 DIAGNOSIS — Z7984 Long term (current) use of oral hypoglycemic drugs: Secondary | ICD-10-CM | POA: Diagnosis not present

## 2021-03-30 DIAGNOSIS — Z7982 Long term (current) use of aspirin: Secondary | ICD-10-CM | POA: Diagnosis not present

## 2021-03-30 DIAGNOSIS — Z8249 Family history of ischemic heart disease and other diseases of the circulatory system: Secondary | ICD-10-CM | POA: Diagnosis not present

## 2021-03-30 DIAGNOSIS — I361 Nonrheumatic tricuspid (valve) insufficiency: Secondary | ICD-10-CM | POA: Diagnosis not present

## 2021-03-30 DIAGNOSIS — R109 Unspecified abdominal pain: Secondary | ICD-10-CM | POA: Diagnosis present

## 2021-03-30 DIAGNOSIS — N2581 Secondary hyperparathyroidism of renal origin: Secondary | ICD-10-CM | POA: Diagnosis present

## 2021-03-30 DIAGNOSIS — H9190 Unspecified hearing loss, unspecified ear: Secondary | ICD-10-CM | POA: Diagnosis present

## 2021-03-30 HISTORY — DX: Enterocolitis due to Clostridium difficile, not specified as recurrent: A04.72

## 2021-03-30 HISTORY — DX: Calculus of gallbladder without cholecystitis without obstruction: K80.20

## 2021-03-30 LAB — GASTROINTESTINAL PANEL BY PCR, STOOL (REPLACES STOOL CULTURE)

## 2021-03-30 LAB — COMPREHENSIVE METABOLIC PANEL
ALT: 11 U/L (ref 0–44)
AST: 17 U/L (ref 15–41)
Albumin: 2.7 g/dL — ABNORMAL LOW (ref 3.5–5.0)
Alkaline Phosphatase: 72 U/L (ref 38–126)
Anion gap: 10 (ref 5–15)
BUN: 16 mg/dL (ref 8–23)
CO2: 26 mmol/L (ref 22–32)
Calcium: 8.5 mg/dL — ABNORMAL LOW (ref 8.9–10.3)
Chloride: 97 mmol/L — ABNORMAL LOW (ref 98–111)
Creatinine, Ser: 2.75 mg/dL — ABNORMAL HIGH (ref 0.44–1.00)
GFR, Estimated: 17 mL/min — ABNORMAL LOW (ref >60)
Glucose, Bld: 192 mg/dL — ABNORMAL HIGH (ref 70–99)
Potassium: 3.6 mmol/L (ref 3.5–5.1)
Sodium: 133 mmol/L — ABNORMAL LOW (ref 135–145)
Total Bilirubin: 0.5 mg/dL (ref 0.3–1.2)
Total Protein: 6 g/dL — ABNORMAL LOW (ref 6.5–8.1)

## 2021-03-30 LAB — TROPONIN I (HIGH SENSITIVITY)
Troponin I (High Sensitivity): 122 ng/L (ref <18)
Troponin I (High Sensitivity): 132 ng/L (ref <18)
Troponin I (High Sensitivity): 139 ng/L (ref <18)
Troponin I (High Sensitivity): 157 ng/L (ref <18)

## 2021-03-30 LAB — C DIFFICILE QUICK SCREEN W PCR REFLEX
C Diff antigen: POSITIVE — AB
C Diff interpretation: DETECTED
C Diff toxin: POSITIVE — AB

## 2021-03-30 LAB — GLUCOSE, CAPILLARY
Glucose-Capillary: 189 mg/dL — ABNORMAL HIGH (ref 70–99)
Glucose-Capillary: 160 mg/dL — ABNORMAL HIGH (ref 70–99)

## 2021-03-30 LAB — POC OCCULT BLOOD, ED: Fecal Occult Bld: NEGATIVE

## 2021-03-30 LAB — CBG MONITORING, ED: Glucose-Capillary: 141 mg/dL — ABNORMAL HIGH (ref 70–99)

## 2021-03-30 LAB — SARS CORONAVIRUS 2 (TAT 6-24 HRS): SARS Coronavirus 2: NEGATIVE

## 2021-03-30 MED ORDER — ONDANSETRON HCL 4 MG/2ML IJ SOLN: 4.0000 mg | Freq: Four times a day (QID) | INTRAMUSCULAR | Status: DC | PRN

## 2021-03-30 MED ORDER — CALCIUM CARBONATE ANTACID 1250 MG/5ML PO SUSP
500.0000 mg | Freq: Four times a day (QID) | ORAL | Status: DC | PRN
  Filled 2021-03-30: qty 5

## 2021-03-30 MED ORDER — HYDROXYZINE HCL 25 MG PO TABS: 25.0000 mg | ORAL_TABLET | Freq: Three times a day (TID) | ORAL | Status: DC | PRN

## 2021-03-30 MED ORDER — HYDROCODONE-ACETAMINOPHEN 5-325 MG PO TABS: 1.0000 | ORAL_TABLET | ORAL | Status: DC | PRN

## 2021-03-30 MED ORDER — ASPIRIN EC 81 MG PO TBEC
81.0000 mg | DELAYED_RELEASE_TABLET | Freq: Every morning | ORAL | Status: DC
  Administered 2021-03-30 – 2021-04-01 (×3): 81 mg via ORAL
  Filled 2021-03-30 (×3): qty 1

## 2021-03-30 MED ORDER — ZOLPIDEM TARTRATE 5 MG PO TABS: 5.0000 mg | ORAL_TABLET | Freq: Every evening | ORAL | Status: DC | PRN

## 2021-03-30 MED ORDER — SODIUM CHLORIDE 0.9% FLUSH
3.0000 mL | Freq: Two times a day (BID) | INTRAVENOUS | Status: DC
  Administered 2021-03-30 – 2021-04-01 (×5): 3 mL via INTRAVENOUS

## 2021-03-30 MED ORDER — RISAQUAD PO CAPS
1.0000 | ORAL_CAPSULE | Freq: Every day | ORAL | Status: DC
  Administered 2021-03-30 – 2021-04-01 (×3): 1 via ORAL
  Filled 2021-03-30 (×3): qty 1

## 2021-03-30 MED ORDER — MORPHINE SULFATE (PF) 2 MG/ML IV SOLN: 2.0000 mg | INTRAVENOUS | Status: DC | PRN

## 2021-03-30 MED ORDER — NEPRO/CARBSTEADY PO LIQD
237.0000 mL | Freq: Three times a day (TID) | ORAL | Status: DC | PRN
  Filled 2021-03-30: qty 237

## 2021-03-30 MED ORDER — FIDAXOMICIN 200 MG PO TABS
200.0000 mg | ORAL_TABLET | Freq: Two times a day (BID) | ORAL | Status: DC
  Administered 2021-03-30 – 2021-04-01 (×5): 200 mg via ORAL
  Filled 2021-03-30 (×7): qty 1

## 2021-03-30 MED ORDER — ACETAMINOPHEN 650 MG RE SUPP: 650.0000 mg | Freq: Four times a day (QID) | RECTAL | Status: DC | PRN

## 2021-03-30 MED ORDER — ACETAMINOPHEN 325 MG PO TABS: 650.0000 mg | ORAL_TABLET | Freq: Four times a day (QID) | ORAL | Status: DC | PRN

## 2021-03-30 MED ORDER — CARVEDILOL 6.25 MG PO TABS
6.2500 mg | ORAL_TABLET | Freq: Two times a day (BID) | ORAL | Status: DC
  Administered 2021-03-30 – 2021-04-01 (×5): 6.25 mg via ORAL
  Filled 2021-03-30 (×2): qty 1
  Filled 2021-03-30: qty 2
  Filled 2021-03-30 (×2): qty 1

## 2021-03-30 MED ORDER — PANTOPRAZOLE SODIUM 40 MG PO TBEC
40.0000 mg | DELAYED_RELEASE_TABLET | Freq: Every day | ORAL | Status: DC
  Administered 2021-03-30 – 2021-04-01 (×3): 40 mg via ORAL
  Filled 2021-03-30 (×3): qty 1

## 2021-03-30 MED ORDER — FENTANYL CITRATE (PF) 100 MCG/2ML IJ SOLN
25.0000 ug | INTRAMUSCULAR | Status: DC | PRN
Start: 2021-03-30 — End: 2021-04-01

## 2021-03-30 MED ORDER — INSULIN ASPART 100 UNIT/ML IJ SOLN
0.0000 [IU] | Freq: Three times a day (TID) | INTRAMUSCULAR | Status: DC
  Administered 2021-03-30 – 2021-03-31 (×2): 1 [IU] via SUBCUTANEOUS

## 2021-03-30 MED ORDER — ONDANSETRON HCL 4 MG PO TABS: 4.0000 mg | ORAL_TABLET | Freq: Four times a day (QID) | ORAL | Status: DC | PRN

## 2021-03-30 MED ORDER — HYDRALAZINE HCL 20 MG/ML IJ SOLN: 5.0000 mg | INTRAMUSCULAR | Status: DC | PRN

## 2021-03-30 MED ORDER — FERRIC CITRATE 1 GM 210 MG(FE) PO TABS
210.0000 mg | ORAL_TABLET | Freq: Three times a day (TID) | ORAL | Status: DC
  Administered 2021-03-30 – 2021-04-01 (×6): 210 mg via ORAL
  Filled 2021-03-30 (×7): qty 1

## 2021-03-30 MED ORDER — CHLORHEXIDINE GLUCONATE CLOTH 2 % EX PADS
6.0000 | MEDICATED_PAD | Freq: Every day | CUTANEOUS | Status: DC
  Administered 2021-03-31 – 2021-04-01 (×2): 6 via TOPICAL

## 2021-03-30 MED ORDER — DOCUSATE SODIUM 283 MG RE ENEM
1.0000 | ENEMA | RECTAL | Status: DC | PRN
  Filled 2021-03-30: qty 1

## 2021-03-30 MED ORDER — ONDANSETRON HCL 4 MG/2ML IJ SOLN
4.0000 mg | Freq: Once | INTRAMUSCULAR | Status: AC
  Administered 2021-03-30: 4 mg via INTRAVENOUS
  Filled 2021-03-30: qty 2

## 2021-03-30 MED ORDER — POLYVINYL ALCOHOL 1.4 % OP SOLN
1.0000 [drp] | Freq: Three times a day (TID) | OPHTHALMIC | Status: DC | PRN
  Filled 2021-03-30: qty 15

## 2021-03-30 MED ORDER — CAMPHOR-MENTHOL 0.5-0.5 % EX LOTN
1.0000 "application " | TOPICAL_LOTION | Freq: Three times a day (TID) | CUTANEOUS | Status: DC | PRN
  Filled 2021-03-30: qty 222

## 2021-03-30 MED ORDER — SORBITOL 70 % SOLN
30.0000 mL | Status: DC | PRN
  Filled 2021-03-30 (×2): qty 30

## 2021-03-30 MED ORDER — INSULIN GLARGINE 100 UNIT/ML ~~LOC~~ SOLN
12.0000 [IU] | Freq: Every morning | SUBCUTANEOUS | Status: DC
  Administered 2021-03-30 – 2021-04-01 (×3): 12 [IU] via SUBCUTANEOUS
  Filled 2021-03-30 (×4): qty 0.12

## 2021-03-30 NOTE — ED Notes (Signed)
Patient notified NT of dark color feces and was placed on bed pan after cleaning bed.

## 2021-03-30 NOTE — ED Notes (Signed)
Pt had 2X dark thick pasty stools. Sample collected and sent to lab.

## 2021-03-30 NOTE — ED Notes (Signed)
Unable to obtain urine sample due to pt stating she has not been able to make urine recently.

## 2021-03-30 NOTE — H&P (Signed)
History and Physical    Anna Carr [id number] DOB: 1941/03/14 DOA: 03/29/2021  PCP: Raina Mina., MD Consultants:  Sofie Hartigan - vascular; [place] - cardiology; nephrology Patient coming from:  Home - lives with Daughter and son; Donald Prose: [place], [phone]; Daughter, Anna Carr, [phone]  Chief Complaint: Abdominal pain, tarry stools  HPI: Anna Carr is a 80 y.o. female with medical history significant of DM; SIADH; HTN; HLD; COPD; ESRD on TTS HD; and chronic diastolic CHF presenting with abdominal pain, tarry stools.  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 She did have an elevated troponin during that hospitalization.   Since she left, her Hgb dropped and she was having abdominal pain and bloating every time she ate.  She felt very nauseated all day the last 2 days.  At HD, she was told that her Hgb was lower and that if she felt tired she should come to the hospital.  She got home, ate, took a nap, and had diarrhea.  The diarrhea was watery and tarry - but "it's always dark."  It has lightened up overnight.  She has had 4-5 episodes of diarrhea in the ER.  She has had some chest tightness with nausea.  She has had SOB during the episodes when she was nauseated during the week.     ED Course:  HD patient, recently hospitalized for GI bleed due to AVMs.  Has had n/v, fatigue.  GI appears chronic but troponins are escalating, concern for ACS.  Possibly anginal equivalent.  Cardiology needs to be called.   Review of Systems: As per HPI; otherwise review of systems reviewed and negative.   Ambulatory Status:  Ambulates without assistance  COVID Vaccine Status:  Complete plus booster  Past Medical History:  Diagnosis Date  . Anemia, chronic disease 01/23/2017  . CHF with left ventricular diastolic dysfunction, NYHA class 1 ([place]) 02/02/2017  . Cholelithiasis 03/30/2021  . Colon polyp   .  COPD mixed type ([place])   . ESRD (end stage renal disease) ([place]) 07/18/2019  . History of blood transfusion   . Hyperlipidemia   . Hypertension   . Insomnia   . Major depressive disorder, recurrent ([place]) 12/22/2015  . Osteoarthritis   . Osteopenia   . Pneumonia   . 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 SIADH (syndrome of inappropriate ADH production) ([place]) 03/29/2019  . Type 2 diabetes mellitus, with long-term current use of insulin ([place]) 03/11/2019    Past Surgical History:  Procedure Laterality Date  . A/V FISTULAGRAM Left 02/17/2021   Procedure: A/V FISTULAGRAM;  Surgeon: Waynetta Sandy, MD;  Location: [facility];  Service: Cardiovascular;  Laterality: Left;  . APPENDECTOMY    . AV FISTULA PLACEMENT Left   . AV FISTULA PLACEMENT Left 03/12/2021   Procedure: INSERTION OF LEFT UPPER ARM ARTERIOVENOUS (AV) GORE-TEX GRAFT;  Surgeon: Waynetta Sandy, MD;  Location: [facility];  Service: Vascular;  Laterality: Left;  . COLONOSCOPY WITH PROPOFOL N/A 03/18/2021   Procedure: COLONOSCOPY WITH PROPOFOL;  Surgeon: Doran Stabler, MD;  Location: [facility];  Service: Gastroenterology;  Laterality: N/A;  . ENTEROSCOPY N/A 03/21/2021   Procedure: ENTEROSCOPY;  Surgeon: Doran Stabler, MD;  Location: [facility];  Service: Gastroenterology;  Laterality: N/A;  . ESOPHAGOGASTRODUODENOSCOPY (EGD) WITH PROPOFOL N/A 03/18/2021   Procedure: ESOPHAGOGASTRODUODENOSCOPY (EGD) WITH PROPOFOL;  Surgeon: Doran Stabler, MD;  Location: [facility];  Service: Gastroenterology;  Laterality: N/A;  . GIVENS CAPSULE STUDY N/A 03/19/2021   Procedure: GIVENS CAPSULE STUDY;  Surgeon: Doran Stabler, MD;  Location: [facility];  Service: Gastroenterology;  Laterality: N/A;  . HOT HEMOSTASIS N/A 03/21/2021   Procedure: HOT HEMOSTASIS (ARGON PLASMA COAGULATION/BICAP);  Surgeon: Doran Stabler, MD;  Location: [facility];  Service: Gastroenterology;  Laterality: N/A;  . HYSTEROSCOPY     . POLYPECTOMY  03/18/2021   Procedure: POLYPECTOMY;  Surgeon: Doran Stabler, MD;  Location: [facility];  Service: Gastroenterology;;    Social History   Socioeconomic History  . Marital status: Widowed    Spouse name: Not on file  . Number of children: Not on file  . Years of education: Not on file  . Highest education level: Not on file  Occupational History  . Not on file  Tobacco Use  . Smoking status: Former Smoker    Years: 10.00    Types: Cigarettes  . Smokeless tobacco: Never Used  . Tobacco comment: quit in 1973  Vaping Use  . Vaping Use: Never used  Substance and Sexual Activity  . Alcohol use: Not Currently  . Drug use: Never  . Sexual activity: Not on file  Other Topics Concern  . Not on file  Social History Narrative  . Not on file   Social Determinants of Health   Financial Resource Strain: Not on file  Food Insecurity: Not on file  Transportation Needs: Not on file  Physical Activity: Not on file  Stress: Not on file  Social Connections: Not on file  Intimate Partner Violence: Not on file    No Known Allergies  Family History  Problem Relation Age of Onset  . Diabetes Mother   . Hypertension Mother   . Diabetes Maternal Grandfather     Prior to Admission medications   Medication Sig Start Date End Date Taking? Authorizing Provider  ascorbic acid (VITAMIN C) 1000 MG tablet Take 1,000 mg by mouth daily in the afternoon. 12/07/19   [provider]  aspirin 81 MG EC tablet Take 81 mg by mouth in the morning.    [provider]  Biotin 5000 MCG TABS Take 5,000 mcg by mouth daily in the afternoon.    [provider]  Calcium Polycarbophil (FIBER) 625 MG TABS Take 625 mg by mouth every evening. 01/09/20   [provider]  carboxymethylcellulose (REFRESH PLUS) 0.5 % SOLN Place 1 drop into both eyes 3 (three) times daily as needed (dry eyes).    [provider]  carvedilol (COREG) 6.25 MG tablet Take  6.25 mg by mouth 2 (two) times daily. 12/07/19   [provider]  Cholecalciferol (QC VITAMIN D3) 125 MCG (5000 UT) TABS Take 5,000 Units by mouth in the morning.    [provider]  Cyanocobalamin (VITAMIN B-12 PO) Take 1 capsule by mouth every evening.    [provider]  ferric citrate (AURYXIA) 1 GM 210 MG(Fe) tablet Take 210 mg by mouth 3 (three) times daily with meals.    [provider]  glimepiride (AMARYL) 4 MG tablet Take 4 mg by mouth 2 (two) times daily. 12/07/19   [provider]  HYDROcodone-acetaminophen (NORCO) 5-325 MG tablet Take 1 tablet by mouth every 6 (six) hours as needed for moderate pain. 03/12/21   Dagoberto Ligas, PA-C  Insulin Glargine-Lixisenatide [facility]) 100-33 UNT-MCG/ML SOPN Inject 12 Units into the skin in the morning. 03/23/21   Nita Sells, MD  Lactobacillus (PROBIOTIC ACIDOPHILUS PO) Take 1 capsule by mouth in the morning.    [provider]  lidocaine-prilocaine (EMLA) cream Apply 1 application topically as needed (prior to fisula access). Patient not taking: Reported on 03/16/2021 07/04/20   [provider]  Omega-3 Fatty Acids (FISH OIL) 1000 MG CAPS Take 1,000 mg by mouth in the morning and at bedtime.    [provider]  pantoprazole (PROTONIX) 40 MG tablet Take 1 tablet (40 mg total) by mouth daily. 03/23/21 03/23/22  Nita Sells, MD  polyethylene glycol (MIRALAX / GLYCOLAX) 17 g packet Take 17 g by mouth daily as needed for moderate constipation.    [provider]  Vitamin E 400 units TABS Take 800 Units by mouth in the morning. 01/09/20   [provider]    Physical Exam: Vitals:   03/30/21 0800 03/30/21 0848 03/30/21 0930 03/30/21 0945  BP: (!) 111/39 (!) 131/41 (!) 138/53 (!) 152/59  Pulse: 81 (!) 59 80 81  Resp: (!) 21 (!) 26  20  Temp:      TempSrc:      SpO2: 96% 94% 98% 90%  Weight:      Height:         . General:  Appears calm and  comfortable and is in NAD . Eyes:  PERRL, EOMI, normal lids, iris . ENT:  Hard of hearing, grossly normal lips & tongue, mmm; poor and mostly absent dentition . Neck:  no LAD, masses or thyromegaly . Cardiovascular:  RRR, no m/r/g. No LE edema.  Marland Kitchen Respiratory:   CTA bilaterally with no wheezes/rales/rhonchi.  Normal respiratory effort. . Abdomen:  soft, mildly diffusely TTP but especially in LQ, ND . Skin:  no rash or induration seen on limited exam . Musculoskeletal:  grossly normal tone BUE/BLE, good ROM, no bony abnormality . Psychiatric:  grossly normal mood and affect, speech fluent and appropriate, AOx3 . Neurologic:  CN 2-12 grossly intact, moves all extremities in coordinated fashion    Radiological Exams on Admission: Independently reviewed - see discussion in A/P where applicable  CT ABDOMEN PELVIS WO CONTRAST  Result Date: 03/30/2021 CLINICAL DATA:  Nonlocalized acute abdominal pain. EXAM: CT ABDOMEN AND PELVIS WITHOUT CONTRAST TECHNIQUE: Multidetector CT imaging of the abdomen and pelvis was performed following the standard protocol without IV contrast. COMPARISON:  CT a abdomen pelvis 01/01/2020, CT abdomen pelvis 03/14/2021 FINDINGS: Lower chest: Interval increase in small right pleural effusion and interval increase in trace to small left pleural effusion. Associated passive atelectasis of the bilateral lower lobes. Mitral annular calcifications. Findings suggestive of anemia. Thoracic aorta calcifications severe. Hepatobiliary: No focal liver abnormality. Calcified gallstones within the gallbladder lumen. No gallbladder wall thickening or pericholecystic fluid. No biliary dilatation. Pancreas: No focal lesion. Normal pancreatic contour. No surrounding inflammatory changes. No main pancreatic ductal dilatation. Spleen: Normal in size without focal abnormality. Adrenals/Urinary Tract: No adrenal nodule bilaterally. Atrophic left kidney. No nephrolithiasis, no hydronephrosis, and no  contour-deforming renal mass. No ureterolithiasis or hydroureter. The urinary bladder is unremarkable. Stomach/Bowel: Stomach is within normal limits. No evidence of bowel wall thickening or dilatation. Diffuse sigmoid diverticulosis. Circumferential rectosigmoid bowel wall thickening likely due to under distension. No perisigmoid or perirectal fat stranding. No pneumatosis. The appendix is not definitely identified. Vascular/Lymphatic: No abdominal aorta or iliac aneurysm. Severe atherosclerotic plaque of the aorta and its branches. No abdominal, pelvic, or inguinal lymphadenopathy. Reproductive: Uterus and bilateral adnexa are unremarkable. Other: No intraperitoneal free fluid. No intraperitoneal free gas.  No organized fluid collection. Musculoskeletal: No abdominal wall hernia or abnormality. No suspicious lytic or blastic osseous lesions. No acute displaced fracture. Multilevel degenerative changes of the spine. IMPRESSION: 1. Interval increase in size of small right and trace to small left pleural effusions. 2. Cholelithiasis with no CT findings of acute cholecystitis or choledocholithiasis. 3. Diffuse sigmoid diverticulosis with no acute diverticulitis. 4. Atrophic left kidney. 5. Circumferential rectosigmoid bowel wall thickening likely due to underdistension. Limited evaluation due to noncontrast study. 6. Aortic Atherosclerosis (ICD10-I70.0) - severe as well as mitral annular calcifications. Electronically Signed   By: Iven Finn M.D.   On: 03/30/2021 06:48   DG Chest 2 View  Result Date: 03/29/2021 CLINICAL DATA:  Anemia, chest pain, weakness. EXAM: CHEST - 2 VIEW COMPARISON:  Chest x-rays dated 03/22/2021 and 03/14/2021. FINDINGS: Probable mild bibasilar atelectasis and/or small pleural effusions, however, improved aeration at the RIGHT lung base compared to most recent chest x-ray of 03/22/2021. No new lung findings. No pneumothorax is seen. Heart size and mediastinal contours are stable.  Dialysis catheter is stable in position. Extensive aortic atherosclerosis. No acute appearing osseous abnormality. IMPRESSION: Probable mild bibasilar atelectasis and/or small bilateral pleural effusions. No evidence of pneumonia or pulmonary edema. Electronically Signed   By: Franki Cabot M.D.   On: 03/29/2021 23:55    EKG: Independently reviewed.  NSR with rate 85; LVH; prolonged QTc 508; nonspecific ST changes with no evidence of acute ischemia   Labs on Admission: I have personally reviewed the available labs and imaging studies at the time of the admission.  Pertinent labs:   Glucose 192 BUN 16/Creatinine 2.75/GFR 17 Albumin 2.7 HS troponin 122, 132, 157 WBC 8.6  Hgb 7.8 INR 1.0   Assessment/Plan Principal Problem:   C. difficile colitis Active Problems:   HLD (hyperlipidemia)   Type 2 diabetes mellitus with chronic kidney disease on chronic dialysis, with long-term current use of insulin ([name])   Chronic diastolic heart failure ([name])   End stage renal disease ([name])   Hypertension   Elevated troponin   Cholelithiasis   C Diff Colitis -Patient with recent hospitalization for GI bleeding -It is not clear that antibiotics were given recently, either during that hospitalization or at the time of fistula revision - but this is distinctly possible -Since d/c, patient has had bloating, abdominal discomfort, and started having diarrhea over the last couple of days that is markedly worsening -C diff POSITIVE -Will admit to tele bed -Clear liquids, advance diet as tolerated -prn fentanyl for pain  -prn Zofran for nausea  -Will start Dificid BID x 10 days -Continue probiotics -May consult to GI if getting worse; patient has outpatient GI f/u from last hospitalization scheduled for 6/20 but this appt is in [place] and she lives near [place], requesting to move appointment to the [place] area instead (message sent to Dr. Henrene Pastor) -Good hand-washing (rather than use of  alcohol gel) will be critical to decrease the risk of spread.  Elevated troponin -Patient with troponin elevation at [facility] previously, 0.36 -> 0.68 -> 0.71 -This was thought to be multifactorial due to ESRD and symptomatic anemia associated with GI bleeding -Now with some c/o chest pressure and elevation of HS troponin, 122 -> 132 -> 157 -Likely demand ischemia in the setting of C diff colitis -However, CT does show significant atherosclerotic disease and so she is at increased risk of ASCVD -Will consult cardiology -Continue ASA (despite recent GI bleed, benefits appear to outweigh risks)  ESRD on  HD -Patient on chronic TTS HD -Nephrology prn order set utilized -She does not appear to be volume overloaded or otherwise in need of acute HD; received full HD session yesterday -Nephrology Dr. Jonnie Finner notified that patient will need HD Tuesday if still here -Continue Auryxia  Cholelithiasis -Gallstones noted on CT without evidence of cholecystitis or choledocholithiasis -Current presentation could be c/w gallbladder disease but + for C diff and this is much more likely the culprit -Outpatient f/u recommended  Recent GI bleed -Heme negative -Hgb 7.8, down from 8.6 -Will follow -Definitely at risk for ongoing small bowel AVM bleeding but appears to be stable currently -Outpatient GI f/u but family requesting appointment change as noted above -Continue Protonix  DM -Recent A1c was 6.9, indicating good control -Continue glargine -Hold Amaryl -Cover with very sensitive-scale SSI  HTN -Continue Coreg  HLD -Hold fish oil due to limited inpatient utility  Chronic diastolic CHF -Appears compensated -Volume control with HD     Note: This patient has been tested and is pending for the novel coronavirus COVID-19. The patient has been fully vaccinated against COVID-19.   Level of care: Telemetry Medical DVT prophylaxis: SCDs Code Status:  Full - confirmed with  patient/family Family Communication: Daughter was present throughout evaluation Disposition Plan:  The patient is from: home  Anticipated d/c is to: home without [facility] services  Anticipated d/c date will depend on clinical response to treatment, likely 2-3 days  Patient is currently: acutely ill Consults called: Cardiology; Nephrology; nutrition Admission status:  Admit - It is my clinical opinion that admission to INPATIENT is reasonable and necessary because of the expectation that this patient will require hospital care that crosses at least 2 midnights to treat this condition based on the medical complexity of the problems presented.  Given the aforementioned information, the predictability of an adverse outcome is felt to be significant.    Karmen Bongo MD [facility]   How to contact the [facility] Attending or Consulting provider [place] or covering provider during after hours [place], for this patient?  1. Check the care team in [facility] and look for a) attending/consulting TR[facility] provider listed and b) the [facility] team listed 2. Log into [email or web address] and use [place] universal password to access. If you do not have the password, please contact the hospital operator. 3. Locate the [facility] provider you are looking for under [facility] and page to a number that you can be directly reached. 4. If you still have difficulty reaching the provider, please page the [facility] (Director on Call) for the Hospitalists listed on amion for assistance.   03/30/2021, 10:23 AM

## 2021-03-30 NOTE — ED Provider Notes (Signed)
[facility] EMERGENCY DEPARTMENT Provider Note   CSN: [id number] Arrival date & time: 03/29/21  2026     History Chief Complaint  Patient presents with  . Abdominal Pain    Anna Carr is a 80 y.o. female.  The history is provided by the patient.  Abdominal Pain Pain location:  Generalized Pain quality: bloating   Pain radiates to:  Does not radiate Pain severity:  Moderate Onset quality:  Gradual Timing:  Intermittent Progression:  Worsening Chronicity:  New Relieved by:  Nothing Worsened by:  Nothing Associated symptoms: chest pain, diarrhea, fatigue and nausea   Associated symptoms: no fever   Patient has an extensive history including ESRD, anemia, recent admission for GI bleed presents with abdominal distention, black watery stool and nausea and dry heaving.  Patient was recently admitted for GI bleed, and since being discharged she has felt significant fatigue.  Over the past day she has had some abdominal pain and distention.  She also reports watery stool.  She was able to finish her dialysis session yesterday.  She is concerned that her hemoglobin levels are lower than normal. She reports she does have some chest pain at times when she is nauseous.     Past Medical History:  Diagnosis Date  . Acute on chronic diastolic congestive heart failure ([place]) 03/11/2019  . Acute systolic heart failure ([place]) 03/12/2019  . Anemia   . Anemia, chronic disease 01/23/2017  . AV fistula ([place])   . Benign hypertension with CKD (chronic kidney disease) stage V ([place]) 09/15/2018  . CHF with left ventricular diastolic dysfunction, NYHA class 1 ([place]) 02/02/2017  . Chronic diastolic heart failure ([place]) 11/22/2019  . Chronic kidney disease, stage 5 ([place]) 07/18/2019  . Colon polyp   . COPD mixed type ([place])   . Depression    03/11/21- patient denies  . Dyspnea    too much fluid  . Dyspnea on exertion   . High risk medication use 12/22/2015  . History of blood  transfusion   . Hyperlipidemia   . Hypertension   . Hypervolemia 11/13/2019  . Hypokalemia 03/11/2019  . Hyponatremia 03/11/2019  . Hypoxia 03/11/2019  . Insomnia   . Insulin long-term use ([place]) 12/22/2015  . Kidney disease   . Major depressive disorder, recurrent ([place]) 12/22/2015  . Malaise and fatigue 12/23/2015  . Osteoarthritis   . Osteopenia   . Pneumonia   . 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 Type 2 diabetes mellitus ([place])   . Type 2 diabetes mellitus, with long-term current use of insulin ([place]) 03/11/2019    Patient Active Problem List   Diagnosis Date Noted  . Heme positive stool   . Elevated troponin 03/16/2021  . Cellulitis 03/16/2021  . Acute anemia 03/15/2021  . PAT (paroxysmal atrial tachycardia) ([place]) 10/04/2020  . PAC (premature atrial contraction) 10/04/2020  . Anemia   . AV fistula ([place])   . COPD mixed type ([place])   . Depression   . Dyspnea on exertion   . Hyperlipidemia   . Hypertension   . Kidney disease   . Osteoarthritis   . Type 2 diabetes mellitus ([place])   . Mild protein-calorie malnutrition ([place]) 03/26/2020  . Complication of vascular dialysis catheter 03/05/2020  . Fever, unspecified 01/23/2020  . Diverticulosis of large intestine without perforation or abscess without bleeding 01/08/2020  . History of colonic polyps 01/08/2020  . Other disorders of phosphorus metabolism 01/08/2020  .  Unspecified hemorrhoids 01/08/2020  . Acute blood loss anemia 01/02/2020  . ESRD on hemodialysis ([place]) 01/02/2020  . GIB (gastrointestinal bleeding) 01/02/2020  . Encounter for removal of sutures 12/28/2019  . Coagulation defect, unspecified ([place]) 12/07/2019  . Encounter for screening for respiratory tuberculosis 12/06/2019  . End stage renal disease ([place]) 12/06/2019  . Iron deficiency anemia, unspecified 12/06/2019  . Pain, unspecified 12/06/2019  . Pruritus, unspecified 12/06/2019  . 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 Chronic diastolic heart failure ([place]) 11/22/2019  . Hypervolemia 11/13/2019  . Arteriovenous fistula, acquired ([place]) 11/02/2019  . Dyspnea, unspecified 11/02/2019  . Hypertensive chronic kidney disease with stage 1 through stage 4 chronic kidney disease, or unspecified chronic kidney disease 07/18/2019  . Chronic kidney disease, stage 5 ([place]) 07/18/2019  . 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 Acute systolic heart failure ([place]) 03/12/2019  . Acute on chronic diastolic (congestive) heart failure ([place]) 03/11/2019  . Hyponatremia 03/11/2019  . Hypoxia 03/11/2019  . Hypokalemia 03/11/2019  . Acute on chronic diastolic congestive heart failure ([place]) 03/11/2019  . Type 2 diabetes mellitus, with long-term current use of insulin ([place]) 03/11/2019  . Benign hypertension with CKD (chronic kidney disease) stage V ([place]) 09/15/2018  . Chronic laryngitis 02/17/2017  . CHF with left ventricular diastolic dysfunction, NYHA class 1 ([place]) 02/02/2017  . Chronic obstructive pulmonary disease, unspecified ([place]) 02/02/2017  . Anemia in chronic kidney disease 01/23/2017  . Anemia, chronic disease 01/23/2017  . Type 2 diabetes mellitus with chronic kidney disease on chronic dialysis, with long-term current use of insulin ([place]) 12/23/2015  . Malaise and fatigue 12/23/2015  . Chronic constipation 12/22/2015  . Colon polyp 12/22/2015  . High risk medication use 12/22/2015  . HLD (hyperlipidemia) 12/22/2015  . 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 Osteopenia 12/22/2015  . Major depressive disorder, single episode, unspecified 12/22/2015  . Insulin long-term use ([place]) 12/22/2015  . Insomnia 12/22/2015  . Major depressive disorder, recurrent ([place]) 12/22/2015    Past Surgical History:  Procedure Laterality Date  . A/V FISTULAGRAM Left 02/17/2021    Procedure: A/V FISTULAGRAM;  Surgeon: Waynetta Sandy, MD;  Location: [facility];  Service: Cardiovascular;  Laterality: Left;  . APPENDECTOMY    . AV FISTULA PLACEMENT Left   . AV FISTULA PLACEMENT Left 03/12/2021   Procedure: INSERTION OF LEFT UPPER ARM ARTERIOVENOUS (AV) GORE-TEX GRAFT;  Surgeon: Waynetta Sandy, MD;  Location: [facility];  Service: Vascular;  Laterality: Left;  . COLONOSCOPY WITH PROPOFOL N/A 03/18/2021   Procedure: COLONOSCOPY WITH PROPOFOL;  Surgeon: Doran Stabler, MD;  Location: [place];  Service: Gastroenterology;  Laterality: N/A;  . ENTEROSCOPY N/A 03/21/2021   Procedure: ENTEROSCOPY;  Surgeon: Doran Stabler, MD;  Location: [place];  Service: Gastroenterology;  Laterality: N/A;  . ESOPHAGOGASTRODUODENOSCOPY (EGD) WITH PROPOFOL N/A 03/18/2021   Procedure: ESOPHAGOGASTRODUODENOSCOPY (EGD) WITH PROPOFOL;  Surgeon: Doran Stabler, MD;  Location: [place];  Service: Gastroenterology;  Laterality: N/A;  . GIVENS CAPSULE STUDY N/A 03/19/2021   Procedure: GIVENS CAPSULE STUDY;  Surgeon: Doran Stabler, MD;  Location: [place];  Service: Gastroenterology;  Laterality: N/A;  . HOT HEMOSTASIS N/A 03/21/2021   Procedure: HOT HEMOSTASIS (ARGON PLASMA COAGULATION/BICAP);  Surgeon: Doran Stabler, MD;  Location: [place];  Service: Gastroenterology;  Laterality: N/A;  . HYSTEROSCOPY    . POLYPECTOMY  03/18/2021  Procedure: POLYPECTOMY;  Surgeon: Doran Stabler, MD;  Location: [facility];  Service: Gastroenterology;;     OB History   No obstetric history on file.     Family History  Problem Relation Age of Onset  . Diabetes Mother   . Hypertension Mother   . Diabetes Maternal Grandfather     Social History   Tobacco Use  . Smoking status: Former Smoker    Years: 10.00    Types: Cigarettes  . Smokeless tobacco: Never Used  . Tobacco comment: quit in 1973  Vaping Use  . Vaping Use: Never used   Substance Use Topics  . Alcohol use: Not Currently  . Drug use: Never    Home Medications Prior to Admission medications   Medication Sig Start Date End Date Taking? Authorizing Provider  ascorbic acid (VITAMIN C) 1000 MG tablet Take 1,000 mg by mouth daily in the afternoon. 12/07/19   [provider]  aspirin 81 MG EC tablet Take 81 mg by mouth in the morning.    [provider]  Biotin 5000 MCG TABS Take 5,000 mcg by mouth daily in the afternoon.    [provider]  Calcium Polycarbophil (FIBER) 625 MG TABS Take 625 mg by mouth every evening. 01/09/20   [provider]  carboxymethylcellulose (REFRESH PLUS) 0.5 % SOLN Place 1 drop into both eyes 3 (three) times daily as needed (dry eyes).    [provider]  carvedilol (COREG) 6.25 MG tablet Take 6.25 mg by mouth 2 (two) times daily. 12/07/19   [provider]  Cholecalciferol (QC VITAMIN D3) 125 MCG (5000 UT) TABS Take 5,000 Units by mouth in the morning.    [provider]  Cyanocobalamin (VITAMIN B-12 PO) Take 1 capsule by mouth every evening.    [provider]  ferric citrate (AURYXIA) 1 GM 210 MG(Fe) tablet Take 210 mg by mouth 3 (three) times daily with meals.    [provider]  glimepiride (AMARYL) 4 MG tablet Take 4 mg by mouth 2 (two) times daily. 12/07/19   [provider]  HYDROcodone-acetaminophen (NORCO) 5-325 MG tablet Take 1 tablet by mouth every 6 (six) hours as needed for moderate pain. 03/12/21   Dagoberto Ligas, PA-C  Insulin Glargine-Lixisenatide [facility]) 100-33 UNT-MCG/ML SOPN Inject 12 Units into the skin in the morning. 03/23/21   Nita Sells, MD  Lactobacillus (PROBIOTIC ACIDOPHILUS PO) Take 1 capsule by mouth in the morning.    [provider]  lidocaine-prilocaine (EMLA) cream Apply 1 application topically as needed (prior to fisula access). Patient not taking: Reported on 03/16/2021 07/04/20   [provider]  Omega-3 Fatty Acids (FISH OIL) 1000 MG CAPS Take 1,000 mg by mouth in the morning and at bedtime.    [provider]  pantoprazole (PROTONIX) 40 MG tablet Take 1 tablet (40 mg total) by mouth daily. 03/23/21 03/23/22  Nita Sells, MD  polyethylene glycol (MIRALAX / GLYCOLAX) 17 g packet Take 17 g by mouth daily as needed for moderate constipation.    [provider]  Vitamin E 400 units TABS Take 800 Units by mouth in the morning. 01/09/20   [provider]    Allergies    Patient has no known allergies.  Review of Systems   Review of Systems  Constitutional: Positive for fatigue. Negative for fever.  Cardiovascular: Positive for chest pain.  Gastrointestinal: Positive for abdominal pain, diarrhea and nausea.  Neurological: Positive for weakness.  All other systems  reviewed and are negative.   Physical Exam Updated Vital Signs BP (!) 137/55   Pulse 84   Temp 98.9 F (37.2 C) (Oral)   Resp 17   Ht 1.562 m (5' 1.5")   Wt 71.5 kg   SpO2 93%   BMI 29.30 kg/m   Physical Exam CONSTITUTIONAL: Chronically ill-appearing, no acute distress HEAD: Normocephalic/atraumatic EYES: EOMI/PERRL ENMT: Mucous membranes moist NECK: supple no meningeal signs SPINE/BACK:entire spine nontender CV: S1/S2 noted, murmur noted LUNGS: Lungs are clear to auscultation bilaterally, no apparent distress ABDOMEN: soft, mild distention nontender, no rebound or guarding, bowel sounds noted throughout abdomen rectal-no blood or melena nurse chaperone present for exam Family at bedside at patient request GU:no cva tenderness NEURO: Pt is awake/alert/appropriate, moves all extremitiesx4.  No facial droop.   EXTREMITIES: pulses normal/equal, full ROM SKIN: Pale PSYCH: no abnormalities of mood noted, alert and oriented to situation  ED Results / Procedures / Treatments   Labs (all labs ordered are listed, but only abnormal results are displayed) Labs  Reviewed  CBC WITH DIFFERENTIAL/PLATELET - Abnormal; Notable for the following components:      Result Value   RBC 2.59 (*)    Hemoglobin 7.8 (*)    HCT 24.4 (*)    RDW 16.2 (*)    All other components within normal limits  COMPREHENSIVE METABOLIC PANEL - Abnormal; Notable for the following components:   Sodium 133 (*)    Chloride 97 (*)    Glucose, Bld 192 (*)    Creatinine, Ser 2.75 (*)    Calcium 8.5 (*)    Total Protein 6.0 (*)    Albumin 2.7 (*)    GFR, Estimated 17 (*)    All other components within normal limits  CBG MONITORING, ED - Abnormal; Notable for the following components:   Glucose-Capillary 166 (*)    All other components within normal limits  TROPONIN I (HIGH SENSITIVITY) - Abnormal; Notable for the following components:   Troponin I (High Sensitivity) 122 (*)    All other components within normal limits  TROPONIN I (HIGH SENSITIVITY) - Abnormal; Notable for the following components:   Troponin I (High Sensitivity) 132 (*)    All other components within normal limits  TROPONIN I (HIGH SENSITIVITY) - Abnormal; Notable for the following components:   Troponin I (High Sensitivity) 157 (*)    All other components within normal limits  PROTIME-INR  URINALYSIS, ROUTINE W REFLEX MICROSCOPIC  POC OCCULT BLOOD, ED  TYPE AND SCREEN  TROPONIN I (HIGH SENSITIVITY)    EKG EKG Interpretation  Date/Time:  Sunday March 30 2021 04:09:21 EDT Ventricular Rate:  85 PR Interval:  167 QRS Duration: 107 QT Interval:  427 QTC Calculation: 508 R Axis:   79 Text Interpretation: Sinus rhythm Atrial premature complexes LVH with secondary repolarization abnormality Prolonged QT interval Confirmed by Ripley Fraise [id number]) on 03/30/2021 4:54:08 AM   Radiology CT ABDOMEN PELVIS WO CONTRAST  Result Date: 03/30/2021 CLINICAL DATA:  Nonlocalized acute abdominal pain. EXAM: CT ABDOMEN AND PELVIS WITHOUT CONTRAST TECHNIQUE: Multidetector CT imaging of the abdomen and pelvis was  performed following the standard protocol without IV contrast. COMPARISON:  CT a abdomen pelvis 01/01/2020, CT abdomen pelvis 03/14/2021 FINDINGS: Lower chest: Interval increase in small right pleural effusion and interval increase in trace to small left pleural effusion. Associated passive atelectasis of the bilateral lower lobes. Mitral annular calcifications. Findings suggestive of anemia. Thoracic aorta calcifications severe. Hepatobiliary: No focal liver abnormality. Calcified gallstones within the  gallbladder lumen. No gallbladder wall thickening or pericholecystic fluid. No biliary dilatation. Pancreas: No focal lesion. Normal pancreatic contour. No surrounding inflammatory changes. No main pancreatic ductal dilatation. Spleen: Normal in size without focal abnormality. Adrenals/Urinary Tract: No adrenal nodule bilaterally. Atrophic left kidney. No nephrolithiasis, no hydronephrosis, and no contour-deforming renal mass. No ureterolithiasis or hydroureter. The urinary bladder is unremarkable. Stomach/Bowel: Stomach is within normal limits. No evidence of bowel wall thickening or dilatation. Diffuse sigmoid diverticulosis. Circumferential rectosigmoid bowel wall thickening likely due to under distension. No perisigmoid or perirectal fat stranding. No pneumatosis. The appendix is not definitely identified. Vascular/Lymphatic: No abdominal aorta or iliac aneurysm. Severe atherosclerotic plaque of the aorta and its branches. No abdominal, pelvic, or inguinal lymphadenopathy. Reproductive: Uterus and bilateral adnexa are unremarkable. Other: No intraperitoneal free fluid. No intraperitoneal free gas. No organized fluid collection. Musculoskeletal: No abdominal wall hernia or abnormality. No suspicious lytic or blastic osseous lesions. No acute displaced fracture. Multilevel degenerative changes of the spine. IMPRESSION: 1. Interval increase in size of small right and trace to small left pleural effusions. 2.  Cholelithiasis with no CT findings of acute cholecystitis or choledocholithiasis. 3. Diffuse sigmoid diverticulosis with no acute diverticulitis. 4. Atrophic left kidney. 5. Circumferential rectosigmoid bowel wall thickening likely due to underdistension. Limited evaluation due to noncontrast study. 6. Aortic Atherosclerosis (ICD10-I70.0) - severe as well as mitral annular calcifications. Electronically Signed   By: Iven Finn M.D.   On: 03/30/2021 06:48   DG Chest 2 View  Result Date: 03/29/2021 CLINICAL DATA:  Anemia, chest pain, weakness. EXAM: CHEST - 2 VIEW COMPARISON:  Chest x-rays dated 03/22/2021 and 03/14/2021. FINDINGS: Probable mild bibasilar atelectasis and/or small pleural effusions, however, improved aeration at the RIGHT lung base compared to most recent chest x-ray of 03/22/2021. No new lung findings. No pneumothorax is seen. Heart size and mediastinal contours are stable. Dialysis catheter is stable in position. Extensive aortic atherosclerosis. No acute appearing osseous abnormality. IMPRESSION: Probable mild bibasilar atelectasis and/or small bilateral pleural effusions. No evidence of pneumonia or pulmonary edema. Electronically Signed   By: Franki Cabot M.D.   On: 03/29/2021 23:55    Procedures .Critical Care Performed by: Ripley Fraise, MD Authorized by: Ripley Fraise, MD   Critical care provider statement:    Critical care time (minutes):  40   Critical care start time:  03/30/2021 5:50 AM   Critical care end time:  03/30/2021 6:30 AM   Critical care time was exclusive of:  Separately billable procedures and treating other patients   Critical care was necessary to treat or prevent imminent or life-threatening deterioration of the following conditions:  Cardiac failure   Critical care was time spent personally by me on the following activities:  Evaluation of patient's response to treatment, discussions with consultants, examination of patient, obtaining history from  patient or surrogate, pulse oximetry, ordering and review of radiographic studies, ordering and review of laboratory studies, ordering and performing treatments and interventions, re-evaluation of patient's condition and review of old charts   I assumed direction of critical care for this patient from another provider in my specialty: no     Care discussed with: admitting provider       Medications Ordered in ED Medications  ondansetron (ZOFRAN) injection 4 mg (4 mg Intravenous Given 03/30/21 0456)    ED Course  I have reviewed the triage vital signs and the nursing notes.  Pertinent labs & imaging results that were available during my care of the patient were  reviewed by me and considered in my medical decision making (see chart for details).    MDM Rules/Calculators/A&P                          5:48 AM Patient with extensive history including ESRD, and recent GI bleed.  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 Patient was deemed stable and was discharged home.  She reports ongoing fatigue since that time, is now having abdominal pain and distention and nausea.  She reports brief episodes of chest pain with nausea.  For her abdominal complaints, will proceed with CT imaging. She also has escalating troponins in the setting of nausea and brief episodes of chest pain.  EKG reveals signs of LVH Discussed this with the on-call cardiology fellow Dr. Neena Rhymes Once her abdominal issues are resolved, patient will need to have further evaluation from a cardiac standpoint.  However will defer any anticoagulation for now due to recent GI bleed 7:36 AM CT imaging is negative for acute process.  She has multiple nonacute issues.  Patient is also reporting excessive diarrhea.  Patient has multiple issues that can be managed as an inpatient.  Discussed with Dr. Lorin Mercy for admission Final Clinical Impression(s) / ED Diagnoses Final diagnoses:  Non-STEMI (non-ST elevated myocardial  infarction) ([place])  Abdominal distention    Rx / DC Orders ED Discharge Orders    None       Ripley Fraise, MD 03/30/21 [phone]

## 2021-03-30 NOTE — Consult Note (Addendum)
Cardiology Consultation:   Patient ID: Anna Carr MRN: [id number]; DOB: 1941/01/02  Admit date: 03/29/2021 Date of Consult: 03/30/2021  PCP:  Anna Mina., MD   [facility] Providers Cardiologist:  Berniece Salines, DO   {   Patient Profile:   Anna Carr is a 80 y.o. female with past medical history of chronic diastolic CHF, HTN, HLD, Type 2 DM, ESRD, mitral regurgitation (moderate by echo in 10/2019) and palpitations (prior monitor in 06/2020 showing episodes of SVT felt to be consistent with atrial tachycardiac and PVC burden of 8.1%) who is being seen today for the evaluation of elevated troponin values at the request of Dr. Lorin Carr.  History of Present Illness:   [name] was just admitted to [facility] from 5/21 - 03/23/2021 for evaluation of worsening chest tightness and weakness, found to be anemic with Hgb at 6.8 and positive FOBT. Troponin I was found to be elevated to 0.71 and was evaluated by Cardiology at [facility] prior to transfer to [facility] and no further cardiac testing was recommended. She received 2 units pRBC's (received 3 total during admission) and underwent colonoscopy with no identifiable source. 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 Hgb was stable at 8.6 on 03/23/2021.  She presented back to [facility] ED on 03/29/2021 for worsening weakness, nausea, lower abdominal pain and tarry stools. She did report intermittent chest tightness as well which led to [facility] Troponin values being obtained. In talking with the patient today, she reports worsening weakness and fatigue since her hospital discharge. No specific hematochezia but she reports melena as her stools "are always dark". Yesterday, she was at HD and was informed her blood counts were  low and if she developed worsening symptoms, then to go to the ED. She developed worsening fatigue yesterday which prompted her to come in. She does report some chest tightness but says this has been occurring for several weeks and can occur at rest or with activity. Lasts for a few minutes then spontaneously resolves. No radiating pain. No orthopnea, PND or pitting edema.   Initial labs this admission show WBC 8.6, Hgb 7.8, platelets 372, Na+ 133, K+ 3.6 and creatinine 2.75.AST 17 and ALT 11. Initial Hs Troponin 122 with repeat values flat at 132, 157 and 139. COVID pending. Full GI panel pending but C. Diff already resulted as being positive. EKG shows NSR, HR 87 with PAC's and LVH with TWI along the inferior leads and likely due to repol but now more prominent. CXR showed mild bibasilar atelectasis or small pleural effusions but no PNA or definitive pulmonary edema. CTA Abdomen shows trace effusions and cholelithiasis with no CT findings of acute cholecystitis or choledocholithiasis and diffuse sigmoid diverticulosis with no acute diverticulitis. Was noted to have severe aortic atherosclerosis and mitral annular calcifications.   Past Medical History:  Diagnosis Date  . Anemia, chronic disease 01/23/2017  . CHF with left ventricular diastolic dysfunction, NYHA class 1 ([place]) 02/02/2017  . Cholelithiasis 03/30/2021  . Colon polyp   . COPD mixed type ([place])   . ESRD (end stage renal disease) ([place]) 07/18/2019  . History of blood transfusion   . Hyperlipidemia   . Hypertension   . Insomnia   . Major depressive disorder, recurrent ([place]) 12/22/2015  .  Osteoarthritis   . Osteopenia   . Pneumonia   . 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 SIADH (syndrome of inappropriate ADH production) ([place]) 03/29/2019  . Type 2 diabetes mellitus, with long-term current use of insulin ([place]) 03/11/2019    Past Surgical History:  Procedure Laterality Date  . A/V FISTULAGRAM Left 02/17/2021   Procedure: A/V FISTULAGRAM;   Surgeon: Waynetta Sandy, MD;  Location: [place] [facility];  Service: Cardiovascular;  Laterality: Left;  . APPENDECTOMY    . AV FISTULA PLACEMENT Left   . AV FISTULA PLACEMENT Left 03/12/2021   Procedure: INSERTION OF LEFT UPPER ARM ARTERIOVENOUS (AV) GORE-TEX GRAFT;  Surgeon: Waynetta Sandy, MD;  Location: [place];  Service: Vascular;  Laterality: Left;  . COLONOSCOPY WITH PROPOFOL N/A 03/18/2021   Procedure: COLONOSCOPY WITH PROPOFOL;  Surgeon: Doran Stabler, MD;  Location: [place];  Service: Gastroenterology;  Laterality: N/A;  . ENTEROSCOPY N/A 03/21/2021   Procedure: ENTEROSCOPY;  Surgeon: Doran Stabler, MD;  Location: [place];  Service: Gastroenterology;  Laterality: N/A;  . ESOPHAGOGASTRODUODENOSCOPY (EGD) WITH PROPOFOL N/A 03/18/2021   Procedure: ESOPHAGOGASTRODUODENOSCOPY (EGD) WITH PROPOFOL;  Surgeon: Doran Stabler, MD;  Location: [place];  Service: Gastroenterology;  Laterality: N/A;  . GIVENS CAPSULE STUDY N/A 03/19/2021   Procedure: GIVENS CAPSULE STUDY;  Surgeon: Doran Stabler, MD;  Location: [place];  Service: Gastroenterology;  Laterality: N/A;  . HOT HEMOSTASIS N/A 03/21/2021   Procedure: HOT HEMOSTASIS (ARGON PLASMA COAGULATION/BICAP);  Surgeon: Doran Stabler, MD;  Location: [place];  Service: Gastroenterology;  Laterality: N/A;  . HYSTEROSCOPY    . POLYPECTOMY  03/18/2021   Procedure: POLYPECTOMY;  Surgeon: Doran Stabler, MD;  Location: [place];  Service: Gastroenterology;;     Home Medications:  Prior to Admission medications   Medication Sig Start Date End Date Taking? Authorizing Provider  ascorbic acid (VITAMIN C) 1000 MG tablet Take 1,000 mg by mouth daily in the afternoon. 12/07/19  Yes [provider]  aspirin 81 MG EC tablet Take 81 mg by mouth in the morning.   Yes [provider]  Biotin 5000 MCG TABS Take 5,000 mcg by mouth daily in the afternoon.   Yes [provider]  carboxymethylcellulose (REFRESH PLUS) 0.5 % SOLN Place 1 drop into both eyes 3 (three) times daily as needed (dry eyes).   Yes [provider]  carvedilol (COREG) 6.25 MG tablet Take 6.25 mg by mouth 2 (two) times daily. 12/07/19  Yes [provider]  Cholecalciferol (QC VITAMIN D3) 125 MCG (5000 UT) TABS Take 5,000 Units by mouth in the morning.   Yes [provider]  Cyanocobalamin (VITAMIN B-12 PO) Take 1 capsule by mouth every evening.   Yes [provider]  ferric citrate (AURYXIA) 1 GM 210 MG(Fe) tablet Take 210 mg by mouth 3 (three) times daily with meals.   Yes [provider]  glimepiride (AMARYL) 4 MG tablet Take 4 mg by mouth 2 (two) times daily. 12/07/19  Yes [provider]  HYDROcodone-acetaminophen (NORCO) 5-325 MG tablet Take 1 tablet by mouth every 6 (six) hours as needed for moderate pain. 03/12/21  Yes Dagoberto Ligas, PA-C  Insulin Glargine-Lixisenatide [facility]) 100-33 UNT-MCG/ML SOPN Inject 12 Units into the skin in the morning. 03/23/21  Yes Nita Sells, MD  Lactobacillus (PROBIOTIC ACIDOPHILUS PO) Take 1 capsule by mouth in the morning.   Yes [provider]  lidocaine-prilocaine (EMLA) cream Apply 1 application  topically as needed (prior to fisula access). 07/04/20  Yes [provider]  Omega-3 Fatty Acids (FISH OIL) 1000 MG CAPS Take 1,000 mg by mouth in the morning and at bedtime.   Yes [provider]  pantoprazole (PROTONIX) 40 MG tablet Take 1 tablet (40 mg total) by mouth daily. 03/23/21 03/23/22 Yes Nita Sells, MD  polyethylene glycol (MIRALAX / GLYCOLAX) 17 g packet Take 17 g by mouth daily as needed for moderate constipation.   Yes [provider]  Vitamin E 400 units TABS Take 800 Units by mouth in the morning. 01/09/20  Yes [provider]    Inpatient Medications: Scheduled Meds: . aspirin  81 mg Oral q AM  . carvedilol  6.25 mg Oral  BID  . ferric citrate  210 mg Oral TID WC  . fidaxomicin  200 mg Oral BID  . insulin aspart  0-6 Units Subcutaneous TID WC  . Insulin Glargine-Lixisenatide  12 Units Subcutaneous q AM  . pantoprazole  40 mg Oral Daily  . Probiotic Acidophilus BioBeads  1 capsule Oral Daily  . sodium chloride flush  3 mL Intravenous Q12H    PRN Meds: acetaminophen **OR** acetaminophen, calcium carbonate (dosed in mg elemental calcium), camphor-menthol **AND** hydrOXYzine, carboxymethylcellulose, docusate sodium, feeding supplement (NEPRO CARB STEADY), fentaNYL (SUBLIMAZE) injection, hydrALAZINE, HYDROcodone-acetaminophen, ondansetron **OR** ondansetron (ZOFRAN) IV, sorbitol, zolpidem  Allergies:   No Known Allergies  Social History:   Social History   Socioeconomic History  . Marital status: Widowed    Spouse name: Not on file  . Number of children: Not on file  . Years of education: Not on file  . Highest education level: Not on file  Occupational History  . Not on file  Tobacco Use  . Smoking status: Former Smoker    Years: 10.00    Types: Cigarettes  . Smokeless tobacco: Never Used  . Tobacco comment: quit in 1973  Vaping Use  . Vaping Use: Never used  Substance and Sexual Activity  . Alcohol use: Not Currently  . Drug use: Never  . Sexual activity: Not on file  Other Topics Concern  . Not on file  Social History Narrative  . Not on file   Social Determinants of Health   Financial Resource Strain: Not on file  Food Insecurity: Not on file  Transportation Needs: Not on file  Physical Activity: Not on file  Stress: Not on file  Social Connections: Not on file  Intimate Partner Violence: Not on file    Family History:    Family History  Problem Relation Age of Onset  . Diabetes Mother   . Hypertension Mother   . Diabetes Maternal Grandfather      ROS:  Please see the history of present illness.    All other ROS reviewed and negative.     Physical Exam/Data:    Vitals:   03/30/21 0848 03/30/21 0930 03/30/21 0945 03/30/21 1100  BP: (!) 131/41 (!) 138/53 (!) 152/59 130/81  Pulse: (!) 59 80 81 83  Resp: (!) '26  20 19  '$ Temp:      TempSrc:      SpO2: 94% 98% 90% 99%  Weight:      Height:       No intake or output data in the 24 hours ending 03/30/21 1103 Last 3 Weights 03/30/2021 03/23/2021 03/22/2021  Weight (lbs) 157 lb 9.6 oz 162 lb 7.7 oz 155 lb 13.8 oz  Weight (kg) 71.487 kg 73.7 kg 70.7 kg  Body mass index is 29.3 kg/m.  General:  Well nourished, well developed female appearing in no acute distress.  HEENT: normal Lymph: no adenopathy Neck: no JVD Endocrine:  No thryomegaly Vascular: No carotid bruits; FA pulses 2+ bilaterally without bruits  Cardiac:  normal S1, S2; RRR; 2/6 systolic murmur along RUSB.  Lungs:  clear to auscultation bilaterally, no wheezing, rhonchi or rales.  Abd: soft, nontender, no hepatomegaly  Ext: Trace lower extremity edema. Musculoskeletal:  No deformities, BUE and BLE strength normal and equal Skin: warm and dry  Neuro:  CNs 2-12 intact, no focal abnormalities noted Psych:  Normal affect   EKG:  The EKG was personally reviewed and demonstrates: NSR, HR 87 with PAC's and LVH with TWI along the inferior leads and likely due to repol but now more prominent.  Telemetry:  Telemetry was personally reviewed and demonstrates: NSR, HR in 70's to 80's. Initially having ventricular trigeminy but now resolved.   Relevant CV Studies:  Echocardiogram: 10/2019   NST: 01/2020  The left ventricular ejection fraction is normal (55-65%).  Nuclear stress EF: 56%.  There was no ST segment deviation noted during stress.  Defect 1: There is a small defect of mild severity present in the basal anterior location.  This is a low risk study.  No evidence of ischemia or MI.  Normal EF.  Breast attenuation   Event Monitor: 06/2020 The minimum heart rate was 41 bpm, maximum heart rate was 200 bpm, and average  heart rate was 75 bpm. Predominant underlying rhythm was Sinus Rhythm.  57 Supraventricular Tachycardia runs occurred, the run with the fastest interval lasting 7 beats with a maximum rate of 200 bpm, the longest lasting 11.4 seconds with an avg rate of 126 bpm.  Premature atrial complexes were frequent (8.1%, [id number]). Premature Ventricular complexes were rare less than 1%.  No ventricular tachycardia, no pauses, No AV block and no atrial fibrillation present. 3 patient triggered events: All associated with premature atrial complex.  Conclusion: This study is remarkable for the following:                             1.  57 episodes supraventricular tachycardia which is likely atrial tachycardia with variable block.                             2.  Frequent premature atrial complex  (8.1%, [id number]).   Laboratory Data:  High Sensitivity Troponin:   Recent Labs  Lab 03/29/21 2239 03/30/21 0046 03/30/21 0343 03/30/21 0630  TROPONINIHS 122* 132* 157* 139*     Chemistry Recent Labs  Lab 03/29/21 2239  NA 133*  K 3.6  CL 97*  CO2 26  GLUCOSE 192*  BUN 16  CREATININE 2.75*  CALCIUM 8.5*  GFRNONAA 17*  ANIONGAP 10    Recent Labs  Lab 03/29/21 2239  PROT 6.0*  ALBUMIN 2.7*  AST 17  ALT 11  ALKPHOS 72  BILITOT 0.5   Hematology Recent Labs  Lab 03/29/21 2239  WBC 8.6  RBC 2.59*  HGB 7.8*  HCT 24.4*  MCV 94.2  MCH 30.1  MCHC 32.0  RDW 16.2*  PLT 372     Radiology/Studies:  CT ABDOMEN PELVIS WO CONTRAST  Result Date: 03/30/2021 CLINICAL DATA:  Nonlocalized acute abdominal pain. EXAM: CT ABDOMEN AND PELVIS WITHOUT CONTRAST TECHNIQUE: Multidetector CT imaging of the abdomen and pelvis was  performed following the standard protocol without IV contrast. COMPARISON:  CT a abdomen pelvis 01/01/2020, CT abdomen pelvis 03/14/2021 FINDINGS: Lower chest: Interval increase in small right pleural effusion and interval increase in trace to small left pleural effusion.  Associated passive atelectasis of the bilateral lower lobes. Mitral annular calcifications. Findings suggestive of anemia. Thoracic aorta calcifications severe. Hepatobiliary: No focal liver abnormality. Calcified gallstones within the gallbladder lumen. No gallbladder wall thickening or pericholecystic fluid. No biliary dilatation. Pancreas: No focal lesion. Normal pancreatic contour. No surrounding inflammatory changes. No main pancreatic ductal dilatation. Spleen: Normal in size without focal abnormality. Adrenals/Urinary Tract: No adrenal nodule bilaterally. Atrophic left kidney. No nephrolithiasis, no hydronephrosis, and no contour-deforming renal mass. No ureterolithiasis or hydroureter. The urinary bladder is unremarkable. Stomach/Bowel: Stomach is within normal limits. No evidence of bowel wall thickening or dilatation. Diffuse sigmoid diverticulosis. Circumferential rectosigmoid bowel wall thickening likely due to under distension. No perisigmoid or perirectal fat stranding. No pneumatosis. The appendix is not definitely identified. Vascular/Lymphatic: No abdominal aorta or iliac aneurysm. Severe atherosclerotic plaque of the aorta and its branches. No abdominal, pelvic, or inguinal lymphadenopathy. Reproductive: Uterus and bilateral adnexa are unremarkable. Other: No intraperitoneal free fluid. No intraperitoneal free gas. No organized fluid collection. Musculoskeletal: No abdominal wall hernia or abnormality. No suspicious lytic or blastic osseous lesions. No acute displaced fracture. Multilevel degenerative changes of the spine. IMPRESSION: 1. Interval increase in size of small right and trace to small left pleural effusions. 2. Cholelithiasis with no CT findings of acute cholecystitis or choledocholithiasis. 3. Diffuse sigmoid diverticulosis with no acute diverticulitis. 4. Atrophic left kidney. 5. Circumferential rectosigmoid bowel wall thickening likely due to underdistension. Limited evaluation due  to noncontrast study. 6. Aortic Atherosclerosis (ICD10-I70.0) - severe as well as mitral annular calcifications. Electronically Signed   By: Iven Finn M.D.   On: 03/30/2021 06:48   DG Chest 2 View  Result Date: 03/29/2021 CLINICAL DATA:  Anemia, chest pain, weakness. EXAM: CHEST - 2 VIEW COMPARISON:  Chest x-rays dated 03/22/2021 and 03/14/2021. FINDINGS: Probable mild bibasilar atelectasis and/or small pleural effusions, however, improved aeration at the RIGHT lung base compared to most recent chest x-ray of 03/22/2021. No new lung findings. No pneumothorax is seen. Heart size and mediastinal contours are stable. Dialysis catheter is stable in position. Extensive aortic atherosclerosis. No acute appearing osseous abnormality. IMPRESSION: Probable mild bibasilar atelectasis and/or small bilateral pleural effusions. No evidence of pneumonia or pulmonary edema. Electronically Signed   By: Franki Cabot M.D.   On: 03/29/2021 23:55   Assessment and Plan:   1. Chest Pain with Mixed Features/Elevated Troponin - She describes brief episodes of chest pressure which can occur at rest or with activity and spontaneously resolve. Has been occurring for several weeks.  - Initial Hs Troponin 122 with repeat values flat at 132, 157 and 139. This has actually down-trended as her prior Troponin I at [facility] was 0.70 which should be a similar equivalent of 700 on the [facility] Troponin scale. EKG shows NSR, HR 87 with PAC's and LVH with TWI along the inferior leads and likely due to repol but now more prominent. - At this time, she would not be a candidate for invasive cardiac testing given her recurrent anemia and recent GIB due to AVM's. Enzyme trend seems most consistent with demand ischemia. NST last year was low-risk. Will obtain a repeat echocardiogram to assess LV function and wall motion as medical therapy could be titrated if her EF is reduced. Recommend treatment  of her underlying anemia as her symptoms seem to  correlate with when her Hgb has declined.  - Continue ASA and Coreg. Will check FLP and consider initiation of statin therapy pending results.   2. Chronic Diastolic CHF - Prior echo showed a preserved EF of 55-60%. Repeat imaging pending. She does have trace lower extremity edema on examination and CT showed trace effusions. Volume managment per HD.   3. Mitral Regurgitation - Moderate by echo in 10/2019. Will plan for a repeat echocardiogram this admission.  4. Palpitations - Prior monitor in 06/2020 showed episodes of SVT felt to be consistent with atrial tachycardiac and PVC burden of 8.1%. Was initially having brief runs of ventricular trigeminy but now resolved. Keep K+ ~ 4.0 and Mg ~ 2.0. Continue Coreg 6.'25mg'$  BID  5. C.diff/Anemia - Recently admitted with acute anemia and Hgb down to 6.8 and was found to have an AVM treated with ablation but was difficult to discern if she may have additional distal bleeding. Hgb was 8.6 on 03/23/2021 and trending down to 7.8 today. C.diff positive. Management per admitting team. Consider repeat GI consult pending Hgb trend.  6. ESRD - On HD - Tuesday, Thursday, Saturday schedule.   Risk Assessment/Risk Scores:     TIMI Risk Score for Unstable Angina or Non-ST Elevation MI:   The patient's TIMI risk score is 5, which indicates a 26% risk of all cause mortality, new or recurrent myocardial infarction or need for urgent revascularization in the next 14 days.  For questions or updates, please contact [place] Please consult [email or web address] for contact info under    Signed, Erma Heritage, PA-C  03/30/2021 11:03 AM   Attending note:  Patient seen and examined.  I reviewed her records and discussed the case with Ms. Delano Metz, I agree with her above findings.  She currently presents to the ER mainly complaining of weakness and nausea, also intermittent chest discomfort.  She has been found to be progressively anemic during her most  recent hemodialysis session, and was also just recently hospitalized with heme positive stools and abnormal troponin I levels to 0.71, hemoglobin down to 6.8 and findings of GI bleeding due to AVM which was ablated.  She was seen by cardiology at [facility] with recommendation to manage medically.  At this point she also describes lower abdominal pain and tarry stools, has been diagnosed with C. difficile positivity as well.  She is in no distress, currently afebrile, systolic blood pressure [id number], heart rate in the 80s.  Lungs are clear.  Cardiac exam reveals RRR with 2/6 systolic murmur.  Trace lower extremity edema.  Pertinent lab work includes potassium 3.6, BUN 16, creatinine 2.75, hemoglobin 7.8 down from 8.6, platelets 372, stool positive for C. difficile.  I personally reviewed her ECG which shows sinus rhythm with PACs, increased voltage and repolarization abnormalities.  Chest x-ray shows bibasilar atelectasis rule out small pleural effusions.  Recurrent and relatively brief episodes of chest discomfort.  Current high-sensitivity troponin I levels are mildly increased and flat not suggestive of ACS.  Troponin I levels at [place] had actually been higher by relative scale.  She is currently being admitted to the hospitalist team for management of C. difficile colitis and also progressive anemia with recently documented GI bleed due to AVMs that were apparently ablated.  She would not be a candidate for invasive cardiac work-up at this point or revascularization given risk of rebleeding on dual antiplatelet therapy were she to undergo PCI.  Would suggest a follow-up echocardiogram in comparison to study from early last year at which point LVEF was normal although she did have moderate mitral regurgitation.  Continue Coreg.  Satira Sark, M.D., F.A.C.C.

## 2021-03-31 ENCOUNTER — Other Ambulatory Visit (HOSPITAL_COMMUNITY): Payer: Self-pay

## 2021-03-31 ENCOUNTER — Inpatient Hospital Stay (HOSPITAL_COMMUNITY): Payer: Medicare Other

## 2021-03-31 DIAGNOSIS — R079 Chest pain, unspecified: Secondary | ICD-10-CM

## 2021-03-31 DIAGNOSIS — I361 Nonrheumatic tricuspid (valve) insufficiency: Secondary | ICD-10-CM

## 2021-03-31 DIAGNOSIS — I340 Nonrheumatic mitral (valve) insufficiency: Secondary | ICD-10-CM

## 2021-03-31 DIAGNOSIS — K8020 Calculus of gallbladder without cholecystitis without obstruction: Secondary | ICD-10-CM

## 2021-03-31 LAB — BASIC METABOLIC PANEL
Anion gap: 10 (ref 5–15)
BUN: 23 mg/dL (ref 8–23)
CO2: 25 mmol/L (ref 22–32)
Calcium: 8.3 mg/dL — ABNORMAL LOW (ref 8.9–10.3)
Chloride: 94 mmol/L — ABNORMAL LOW (ref 98–111)
Creatinine, Ser: 3.66 mg/dL — ABNORMAL HIGH (ref 0.44–1.00)
GFR, Estimated: 12 mL/min — ABNORMAL LOW (ref >60)
Glucose, Bld: 110 mg/dL — ABNORMAL HIGH (ref 70–99)
Potassium: 2.8 mmol/L — ABNORMAL LOW (ref 3.5–5.1)
Sodium: 129 mmol/L — ABNORMAL LOW (ref 135–145)

## 2021-03-31 LAB — GLUCOSE, CAPILLARY
Glucose-Capillary: 112 mg/dL — ABNORMAL HIGH (ref 70–99)
Glucose-Capillary: 215 mg/dL — ABNORMAL HIGH (ref 70–99)
Glucose-Capillary: 154 mg/dL — ABNORMAL HIGH (ref 70–99)
Glucose-Capillary: 147 mg/dL — ABNORMAL HIGH (ref 70–99)
Glucose-Capillary: 204 mg/dL — ABNORMAL HIGH (ref 70–99)

## 2021-03-31 LAB — ECHOCARDIOGRAM COMPLETE
AR max vel: 1.31 cm2
AV Area VTI: 1.3 cm2
AV Area mean vel: 1.38 cm2
AV Mean grad: 7 mmHg
AV Peak grad: 11.3 mmHg
Ao pk vel: 1.68 m/s
Area-P 1/2: 3.53 cm2
Height: 61.5 in
MV M vel: 5.24 m/s
MV Peak grad: 109.8 mmHg
MV VTI: 1.39 cm2
Radius: 0.5 cm
S' Lateral: 3.3 cm
Weight: 2561.6 oz

## 2021-03-31 LAB — CBC
HCT: 22.4 % — ABNORMAL LOW (ref 36.0–46.0)
Hemoglobin: 7.1 g/dL — ABNORMAL LOW (ref 12.0–15.0)
MCH: 29.6 pg (ref 26.0–34.0)
MCHC: 31.7 g/dL (ref 30.0–36.0)
MCV: 93.3 fL (ref 80.0–100.0)
Platelets: 324 10*3/uL (ref 150–400)
RBC: 2.4 MIL/uL — ABNORMAL LOW (ref 3.87–5.11)
RDW: 15.9 % — ABNORMAL HIGH (ref 11.5–15.5)
WBC: 9.8 10*3/uL (ref 4.0–10.5)
nRBC: 0 % (ref 0.0–0.2)

## 2021-03-31 LAB — PREPARE RBC (CROSSMATCH)

## 2021-03-31 LAB — LIPID PANEL
Cholesterol: 162 mg/dL (ref 0–200)
HDL: 37 mg/dL — ABNORMAL LOW (ref >40)
LDL Cholesterol: 88 mg/dL (ref 0–99)
Total CHOL/HDL Ratio: 4.4 RATIO
Triglycerides: 185 mg/dL — ABNORMAL HIGH (ref <150)
VLDL: 37 mg/dL (ref 0–40)

## 2021-03-31 MED ORDER — CHLORHEXIDINE GLUCONATE CLOTH 2 % EX PADS: 6.0000 | MEDICATED_PAD | Freq: Every day | CUTANEOUS | Status: DC

## 2021-03-31 MED ORDER — ENSURE ENLIVE PO LIQD: 237.0000 mL | ORAL | Status: DC

## 2021-03-31 MED ORDER — POLYETHYLENE GLYCOL 3350 17 G PO PACK
17.0000 g | PACK | Freq: Every day | ORAL | Status: DC | PRN
  Administered 2021-03-31: 17 g via ORAL
  Filled 2021-03-31: qty 1

## 2021-03-31 MED ORDER — SODIUM CHLORIDE 0.9% IV SOLUTION: Freq: Once | INTRAVENOUS | Status: DC

## 2021-03-31 MED ORDER — POTASSIUM CHLORIDE CRYS ER 20 MEQ PO TBCR
40.0000 meq | EXTENDED_RELEASE_TABLET | Freq: Once | ORAL | Status: AC
  Administered 2021-03-31: 40 meq via ORAL
  Filled 2021-03-31: qty 2

## 2021-03-31 MED ORDER — RENA-VITE PO TABS
1.0000 | ORAL_TABLET | Freq: Every day | ORAL | Status: DC
  Administered 2021-03-31: 1 via ORAL
  Filled 2021-03-31: qty 1

## 2021-03-31 NOTE — Progress Notes (Signed)
PROGRESS NOTE    Anna Carr  [id number] DOB: [birth year] DOA: 03/29/2021 PCP: Raina Mina., MD    Brief Narrative:  80 y.o. female with medical history significant of DM; SIADH; HTN; HLD; COPD; ESRD on TTS HD; and chronic diastolic CHF presenting with abdominal pain, tarry stools.  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 She did have an elevated troponin during that hospitalization.   Since she left, her Hgb dropped and she was having abdominal pain and bloating every time she ate.  She felt very nauseated all day the last 2 days.  At HD, she was told that her Hgb was lower and that if she felt tired she should come to the hospital.  She got home, ate, took a nap, and had diarrhea.  The diarrhea was watery and tarry - but "it's always dark."  It has lightened up overnight.  She has had 4-5 episodes of diarrhea in the ER.  She has had some chest tightness with nausea.  She has had SOB during the episodes when she was nauseated during the week.    Assessment & Plan:   Principal Problem:   C. difficile colitis Active Problems:   HLD (hyperlipidemia)   Type 2 diabetes mellitus with chronic kidney disease on chronic dialysis, with long-term current use of insulin ([name])   Chronic diastolic heart failure ([name])   End stage renal disease ([name])   Hypertension   Elevated troponin   Cholelithiasis  C Diff Colitis at time of presentation -Patient with recent hospitalization for GI bleeding -C diff POSITIVE at time of presentation - Dificid BID x 10 days ordered at time of presentation -On probiotics -Good hand-washing (rather than use of alcohol gel) will be critical to decrease the risk of spread. -Tolerated clear diet this AM, advance diet as tolerated -recheck cbc in AM  Elevated troponin -Patient with troponin elevation at [facility] previously, 0.36 -> 0.68 -> 0.71 -Seen by Cardiology, likely demand  mismatch with trop trending down -2d echo reviewed, no wall motion abnormality  ESRD on HD -Patient on chronic TTS HD -Nephrology consulted  Cholelithiasis -Gallstones noted on CT without evidence of cholecystitis or choledocholithiasis -tolerating diet  Recent GI bleed -Heme negative -Hgb 7.8, down from 8.6 -Outpatient GI f/u  -Continue Protonix -repeat CBC in AM  DM -Recent A1c was 6.9, indicating good control -Continue glargine -Hold Amaryl while in hospital -Cover with very sensitive-scale SSI  HTN -Continue Coreg  HLD -Hold fish oil due to limited inpatient utility  Chronic diastolic CHF -Appears compensated -Volume control with HD  DVT prophylaxis: SCD's Code Status: Full Family Communication: Pt in room, family is at bedside  Status is: Inpatient  Remains inpatient appropriate because:Inpatient level of care appropriate due to severity of illness   Dispo: The patient is from: Home              Anticipated d/c is to: Home              Patient currently is not medically stable to d/c.   Difficult to place patient No       Consultants:   Nephrology  Procedures:     Antimicrobials: Anti-infectives (From admission, onward)   Start     Dose/Rate Route Frequency Ordered Stop   03/30/21 1100  fidaxomicin (DIFICID) tablet 200 mg        200 mg Oral 2 times daily 03/30/21 0901 04/09/21  0959       Subjective: Denies abd pain this AM, tolerating clears  Objective: Vitals:   03/30/21 2002 03/30/21 2224 03/31/21 0510 03/31/21 0924  BP: (!) 140/52 (!) 142/72 (!) 127/92 126/62  Pulse: 70  80 70  Resp: '18  18 18  '$ Temp: 97.9 F (36.6 C)  98.6 F (37 C) 97.9 F (36.6 C)  TempSrc: Oral  Axillary   SpO2: 96%  95% 97%  Weight:      Height:       No intake or output data in the 24 hours ending 03/31/21 1826 Filed Weights   03/30/21 0205 03/30/21 1634  Weight: 71.5 kg 72.6 kg    Examination: General exam: Awake, laying in bed, in  nad Respiratory system: Normal respiratory effort, no wheezing Cardiovascular system: regular rate, s1, s2 Gastrointestinal system: Soft, nondistended, positive BS Central nervous system: CN2-12 grossly intact, strength intact Extremities: Perfused, no clubbing Skin: Normal skin turgor, no notable skin lesions seen Psychiatry: Mood normal // no visual hallucinations   Data Reviewed: I have personally reviewed following labs and imaging studies  CBC: Recent Labs  Lab 03/29/21 2239 03/31/21 0428  WBC 8.6 9.8  NEUTROABS 6.2  --   HGB 7.8* 7.1*  HCT 24.4* 22.4*  MCV 94.2 93.3  PLT 372 0000000   Basic Metabolic Panel: Recent Labs  Lab 03/29/21 2239 03/31/21 0428  NA 133* 129*  K 3.6 2.8*  CL 97* 94*  CO2 26 25  GLUCOSE 192* 110*  BUN 16 23  CREATININE 2.75* 3.66*  CALCIUM 8.5* 8.3*   GFR: Estimated Creatinine Clearance: 11.5 mL/min (A) (by C-G formula based on SCr of 3.66 mg/dL (H)). Liver Function Tests: Recent Labs  Lab 03/29/21 2239  AST 17  ALT 11  ALKPHOS 72  BILITOT 0.5  PROT 6.0*  ALBUMIN 2.7*   No results for input(s): LIPASE, AMYLASE in the last 168 hours. No results for input(s): AMMONIA in the last 168 hours. Coagulation Profile: Recent Labs  Lab 03/29/21 2239  INR 1.0   Cardiac Enzymes: No results for input(s): CKTOTAL, CKMB, CKMBINDEX, TROPONINI in the last 168 hours. BNP (last 3 results) No results for input(s): PROBNP in the last 8760 hours. HbA1C: No results for input(s): HGBA1C in the last 72 hours. CBG: Recent Labs  Lab 03/30/21 2153 03/31/21 0747 03/31/21 1138 03/31/21 1425 03/31/21 1618  GLUCAP 160* 112* 215* 154* 147*   Lipid Profile: Recent Labs    03/31/21 0428  CHOL 162  HDL 37*  LDLCALC 88  TRIG 185*  CHOLHDL 4.4   Thyroid Function Tests: No results for input(s): TSH, T4TOTAL, FREET4, T3FREE, THYROIDAB in the last 72 hours. Anemia Panel: No results for input(s): VITAMINB12, FOLATE, FERRITIN, TIBC, IRON, RETICCTPCT  in the last 72 hours. Sepsis Labs: No results for input(s): PROCALCITON, LATICACIDVEN in the last 168 hours.  Recent Results (from the past 240 hour(s))  SARS CORONAVIRUS 2 (TAT 6-24 HRS) Nasopharyngeal Nasopharyngeal Swab     Status: None   Collection Time: 03/30/21  6:53 AM   Specimen: Nasopharyngeal Swab  Result Value Ref Range Status   SARS Coronavirus 2 NEGATIVE NEGATIVE Final    Comment: (NOTE) SARS-CoV-2 target nucleic acids are NOT DETECTED.  The SARS-CoV-2 RNA is generally detectable in upper and lower respiratory specimens during the acute phase of infection. Negative results do not preclude SARS-CoV-2 infection, do not rule out co-infections with other pathogens, and should not be used as the sole basis for treatment or other  patient management decisions. Negative results must be combined with clinical observations, patient history, and epidemiological information. The expected result is Negative.  Fact Sheet for Patients: SugarRoll.be  Fact Sheet for Healthcare Providers: https://www.woods-mathews.com/  This test is not yet approved or cleared by the [place] FDA and  has been authorized for detection and/or diagnosis of SARS-CoV-2 by FDA under an Emergency Use Authorization (EUA). This EUA will remain  in effect (meaning this test can be used) for the duration of the COVID-19 declaration under Se ction 564(b)(1) of the Act, 21 U.S.C. section 360bbb-3(b)(1), unless the authorization is terminated or revoked sooner.  Performed at [facility], [place] [address] [place], [place] [address]   Gastrointestinal Panel by PCR , Stool     Status: None   Collection Time: 03/30/21  7:45 AM   Specimen: Stool  Result Value Ref Range Status   Campylobacter species NOT DETECTED NOT DETECTED Final   Plesimonas shigelloides NOT DETECTED NOT DETECTED Final   Salmonella species NOT DETECTED NOT DETECTED Final   Yersinia  enterocolitica NOT DETECTED NOT DETECTED Final   Vibrio species NOT DETECTED NOT DETECTED Final   Vibrio cholerae NOT DETECTED NOT DETECTED Final   Enteroaggregative E coli (EAEC) NOT DETECTED NOT DETECTED Final   Enteropathogenic E coli (EPEC) NOT DETECTED NOT DETECTED Final   Enterotoxigenic E coli (ETEC) NOT DETECTED NOT DETECTED Final   Shiga like toxin producing E coli (STEC) NOT DETECTED NOT DETECTED Final   Shigella/Enteroinvasive E coli (EIEC) NOT DETECTED NOT DETECTED Final   Cryptosporidium NOT DETECTED NOT DETECTED Final   Cyclospora cayetanensis NOT DETECTED NOT DETECTED Final   Entamoeba histolytica NOT DETECTED NOT DETECTED Final   Giardia lamblia NOT DETECTED NOT DETECTED Final   Adenovirus F40/41 NOT DETECTED NOT DETECTED Final   Astrovirus NOT DETECTED NOT DETECTED Final   Norovirus GI/GII NOT DETECTED NOT DETECTED Final   Rotavirus A NOT DETECTED NOT DETECTED Final   Sapovirus (I, II, IV, and V) NOT DETECTED NOT DETECTED Final    Comment: Performed at [facility], [place] [place], [place] [address]  C Difficile Quick Screen w PCR reflex     Status: Abnormal   Collection Time: 03/30/21  7:45 AM   Specimen: Stool  Result Value Ref Range Status   C Diff antigen POSITIVE (A) NEGATIVE Final   C Diff toxin POSITIVE (A) NEGATIVE Final   C Diff interpretation Toxin producing C. difficile detected.  Final    Comment: RESULTED WITH RN JANET AT [phone] NM Performed at [facility], [place] [address], [place], [place] [address]      Radiology Studies: CT ABDOMEN PELVIS WO CONTRAST  Result Date: 03/30/2021 CLINICAL DATA:  Nonlocalized acute abdominal pain. EXAM: CT ABDOMEN AND PELVIS WITHOUT CONTRAST TECHNIQUE: Multidetector CT imaging of the abdomen and pelvis was performed following the standard protocol without IV contrast. COMPARISON:  CT a abdomen pelvis 01/01/2020, CT abdomen pelvis 03/14/2021 FINDINGS: Lower chest: Interval increase in small right  pleural effusion and interval increase in trace to small left pleural effusion. Associated passive atelectasis of the bilateral lower lobes. Mitral annular calcifications. Findings suggestive of anemia. Thoracic aorta calcifications severe. Hepatobiliary: No focal liver abnormality. Calcified gallstones within the gallbladder lumen. No gallbladder wall thickening or pericholecystic fluid. No biliary dilatation. Pancreas: No focal lesion. Normal pancreatic contour. No surrounding inflammatory changes. No main pancreatic ductal dilatation. Spleen: Normal in size without focal abnormality. Adrenals/Urinary Tract: No adrenal nodule bilaterally. Atrophic  left kidney. No nephrolithiasis, no hydronephrosis, and no contour-deforming renal mass. No ureterolithiasis or hydroureter. The urinary bladder is unremarkable. Stomach/Bowel: Stomach is within normal limits. No evidence of bowel wall thickening or dilatation. Diffuse sigmoid diverticulosis. Circumferential rectosigmoid bowel wall thickening likely due to under distension. No perisigmoid or perirectal fat stranding. No pneumatosis. The appendix is not definitely identified. Vascular/Lymphatic: No abdominal aorta or iliac aneurysm. Severe atherosclerotic plaque of the aorta and its branches. No abdominal, pelvic, or inguinal lymphadenopathy. Reproductive: Uterus and bilateral adnexa are unremarkable. Other: No intraperitoneal free fluid. No intraperitoneal free gas. No organized fluid collection. Musculoskeletal: No abdominal wall hernia or abnormality. No suspicious lytic or blastic osseous lesions. No acute displaced fracture. Multilevel degenerative changes of the spine. IMPRESSION: 1. Interval increase in size of small right and trace to small left pleural effusions. 2. Cholelithiasis with no CT findings of acute cholecystitis or choledocholithiasis. 3. Diffuse sigmoid diverticulosis with no acute diverticulitis. 4. Atrophic left kidney. 5. Circumferential  rectosigmoid bowel wall thickening likely due to underdistension. Limited evaluation due to noncontrast study. 6. Aortic Atherosclerosis (ICD10-I70.0) - severe as well as mitral annular calcifications. Electronically Signed   By: Iven Finn M.D.   On: 03/30/2021 06:48   DG Chest 2 View  Result Date: 03/29/2021 CLINICAL DATA:  Anemia, chest pain, weakness. EXAM: CHEST - 2 VIEW COMPARISON:  Chest x-rays dated 03/22/2021 and 03/14/2021. FINDINGS: Probable mild bibasilar atelectasis and/or small pleural effusions, however, improved aeration at the RIGHT lung base compared to most recent chest x-ray of 03/22/2021. No new lung findings. No pneumothorax is seen. Heart size and mediastinal contours are stable. Dialysis catheter is stable in position. Extensive aortic atherosclerosis. No acute appearing osseous abnormality. IMPRESSION: Probable mild bibasilar atelectasis and/or small bilateral pleural effusions. No evidence of pneumonia or pulmonary edema. Electronically Signed   By: Franki Cabot M.D.   On: 03/29/2021 23:55   ECHOCARDIOGRAM COMPLETE  Result Date: 03/31/2021    ECHOCARDIOGRAM REPORT   Patient Name:   LASHEA RUNGE Date of Exam: 03/31/2021 Medical Rec #:  [id number]         Height:       61.5 in Accession #:    [id number]        Weight:       160.1 lb Date of Birth:  [birth year]         BSA:          1.729 m Patient Age:    55 years          BP:           126/62 mmHg Patient Gender: F                 HR:           81 bpm. Exam Location:  Inpatient Procedure: 2D Echo, Cardiac Doppler and Color Doppler Indications:    Chest pain  History:        Patient has prior history of Echocardiogram examinations, most                 recent 11/14/2019. CHF, Mitral Valve Disease; Risk                 Factors:Hypertension, Dyslipidemia and Diabetes. ESRD.  Sonographer:    Clayton Lefort RDCS (AE) Referring Phys: [id number] [place]  1. Left ventricular ejection fraction, by estimation, is 55 to 60%.  The left ventricle has normal function. The left ventricle has no regional  wall motion abnormalities. There is moderate concentric left ventricular hypertrophy. Left ventricular diastolic parameters are consistent with Grade III diastolic dysfunction (restrictive).  2. Right ventricular systolic function is normal. The right ventricular size is normal. There is mildly elevated pulmonary artery systolic pressure.  3. Left atrial size was severely dilated.  4. The mitral valve is abnormal. Mild to moderate mitral valve regurgitation. Mild mitral stenosis. Severe mitral annular calcification.  5. The aortic valve is tricuspid. There is moderate calcification of the aortic valve. There is moderate thickening of the aortic valve. Aortic valve regurgitation is not visualized. Mild to moderate aortic valve sclerosis/calcification is present, without any evidence of aortic stenosis.  6. The inferior vena cava is normal in size with greater than 50% respiratory variability, suggesting right atrial pressure of 3 mmHg. Comparison(s): Prior images unable to be directly viewed, comparison made by report only. No significant change from prior study. FINDINGS  Left Ventricle: Left ventricular ejection fraction, by estimation, is 55 to 60%. The left ventricle has normal function. The left ventricle has no regional wall motion abnormalities. The left ventricular internal cavity size was normal in size. There is  moderate concentric left ventricular hypertrophy. Left ventricular diastolic parameters are consistent with Grade III diastolic dysfunction (restrictive). Right Ventricle: The right ventricular size is normal. Right vetricular wall thickness was not well visualized. Right ventricular systolic function is normal. There is mildly elevated pulmonary artery systolic pressure. The tricuspid regurgitant velocity  is 2.93 m/s, and with an assumed right atrial pressure of 3 mmHg, the estimated right ventricular systolic pressure is  123XX123 mmHg. Left Atrium: Left atrial size was severely dilated. Right Atrium: Right atrial size was not well visualized. Pericardium: There is no evidence of pericardial effusion. Mitral Valve: The mitral valve is abnormal. There is moderate thickening of the mitral valve leaflet(s). There is moderate calcification of the mitral valve leaflet(s). Severe mitral annular calcification. Mild to moderate mitral valve regurgitation. Mild mitral valve stenosis. MV peak gradient, 14.0 mmHg. The mean mitral valve gradient is 5.0 mmHg with average heart rate of 75 bpm. Tricuspid Valve: The tricuspid valve is grossly normal. Tricuspid valve regurgitation is mild. Aortic Valve: The aortic valve is tricuspid. There is moderate calcification of the aortic valve. There is moderate thickening of the aortic valve. Aortic valve regurgitation is not visualized. Mild to moderate aortic valve sclerosis/calcification is present, without any evidence of aortic stenosis. Aortic valve mean gradient measures 7.0 mmHg. Aortic valve peak gradient measures 11.3 mmHg. Aortic valve area, by VTI measures 1.30 cm. Pulmonic Valve: The pulmonic valve was not well visualized. Pulmonic valve regurgitation is not visualized. No evidence of pulmonic stenosis. Aorta: The aortic root and ascending aorta are structurally normal, with no evidence of dilitation. Venous: The inferior vena cava is normal in size with greater than 50% respiratory variability, suggesting right atrial pressure of 3 mmHg. IAS/Shunts: The interatrial septum was not well visualized.  LEFT VENTRICLE PLAX 2D LVIDd:         5.00 cm  Diastology LVIDs:         3.30 cm  LV e' medial:    4.68 cm/s LV PW:         1.30 cm  LV E/e' medial:  36.5 LV IVS:        1.50 cm  LV e' lateral:   10.40 cm/s LVOT diam:     1.90 cm  LV E/e' lateral: 16.4 LV SV:         59  LV SV Index:   34 LVOT Area:     2.84 cm  RIGHT VENTRICLE             IVC RV Basal diam:  3.30 cm     IVC diam: 1.80 cm RV S prime:      10.20 cm/s TAPSE (M-mode): 2.1 cm LEFT ATRIUM            Index       RIGHT ATRIUM           Index LA diam:      4.80 cm  2.78 cm/m  RA Area:     14.10 cm LA Vol (A4C): 108.0 ml 62.47 ml/m RA Volume:   36.50 ml  21.11 ml/m  AORTIC VALVE AV Area (Vmax):    1.31 cm AV Area (Vmean):   1.38 cm AV Area (VTI):     1.30 cm AV Vmax:           168.00 cm/s AV Vmean:          124.000 cm/s AV VTI:            0.453 m AV Peak Grad:      11.3 mmHg AV Mean Grad:      7.0 mmHg LVOT Vmax:         77.70 cm/s LVOT Vmean:        60.500 cm/s LVOT VTI:          0.208 m LVOT/AV VTI ratio: 0.46  AORTA Ao Root diam: 2.90 cm Ao Asc diam:  3.00 cm MITRAL VALVE                 TRICUSPID VALVE MV Area (PHT): 3.53 cm      TR Peak grad:   34.3 mmHg MV Area VTI:   1.39 cm      TR Vmax:        293.00 cm/s MV Peak grad:  14.0 mmHg MV Mean grad:  5.0 mmHg      SHUNTS MV Vmax:       1.87 m/s      Systemic VTI:  0.21 m MV Vmean:      98.8 cm/s     Systemic Diam: 1.90 cm MV Decel Time: 215 msec MR Peak grad:    109.8 mmHg MR Mean grad:    72.0 mmHg MR Vmax:         524.00 cm/s MR Vmean:        404.0 cm/s MR PISA:         1.57 cm MR PISA Eff ROA: 12 mm MR PISA Radius:  0.50 cm MV E velocity: 171.00 cm/s MV A velocity: 72.80 cm/s MV E/A ratio:  2.35 Buford Dresser MD Electronically signed by Buford Dresser MD Signature Date/Time: 03/31/2021/1:42:49 PM    Final     Scheduled Meds: . sodium chloride   Intravenous Once  . acidophilus  1 capsule Oral Daily  . aspirin EC  81 mg Oral q AM  . carvedilol  6.25 mg Oral BID  . Chlorhexidine Gluconate Cloth  6 each Topical Daily  . feeding supplement  237 mL Oral Q24H  . ferric citrate  210 mg Oral TID WC  . fidaxomicin  200 mg Oral BID  . insulin aspart  0-6 Units Subcutaneous TID WC  . insulin glargine  12 Units Subcutaneous q AM  . multivitamin  1 tablet Oral QHS  . pantoprazole  40 mg Oral Daily  . sodium  chloride flush  3 mL Intravenous Q12H   Continuous Infusions:    LOS: 1 day   Marylu Lund, MD [facility] Pager On Amion  If 7PM-7AM, please contact night-coverage 03/31/2021, 6:26 PM

## 2021-03-31 NOTE — TOC Benefit Eligibility Note (Addendum)
Patient Advocate Encounter  Patient is approved through [employer] for [employer] co-pay assitance through 04/30/2021.      Lyndel Safe, [place] Patient Advocate Specialist [place] Antimicrobial Stewardship Team Direct Number: [phone]  Fax: [phone]

## 2021-03-31 NOTE — Progress Notes (Addendum)
[facility] Progress Note   Anna Carr is a 80 Y/O female on hemodialysis T,Th,S at [facility]. PMH: HTN, DMT2, HFpEF, moderate MR, SIADH, COPD, HLD, cecal adeoma Anemia of Chronic disease, sHPT. Recent admission to [facility] 05/21-05/29/2022 for GIB. Colonoscopy revealed diverticulosis. She returned to ED 03/30/21 with C/O nausea, SOB, diarrhea. She has been admitted for C Difficile. Troponin trend low and flat. Cardiology has been consulted. No further CP/SOB since admission. No liquid stools this AM.   Subjective: Seen in room with family at bedside. Largest complaint is about liquid diet. She feels weak. No liquid stools this AM. K+ noted to be 2.8. Denies SOB at present.   Objective Vitals:   03/30/21 2002 03/30/21 2224 03/31/21 0510 03/31/21 0924  BP: (!) 140/52 (!) 142/72 (!) 127/92 126/62  Pulse: 70  80 70  Resp: '18  18 18  '$ Temp: 97.9 F (36.6 C)  98.6 F (37 C) 97.9 F (36.6 C)  TempSrc: Oral  Axillary   SpO2: 96%  95% 97%  Weight:      Height:       Physical Exam General: Pleasant, Chronically ill appearing female in NAD Heart: [id number] 2/6 systolic M. No R/G. SR on monitor with rare PACs.  Lungs: CTAB A/P  Abdomen: soft, NT, active BS Extremities: Trace BLE edema Dialysis Access: L AVG maturing. +T/B. RIJ TDC drsg intact    Additional Objective Labs: Basic Metabolic Panel: Recent Labs  Lab 03/29/21 2239 03/31/21 0428  NA 133* 129*  K 3.6 2.8*  CL 97* 94*  CO2 26 25  GLUCOSE 192* 110*  BUN 16 23  CREATININE 2.75* 3.66*  CALCIUM 8.5* 8.3*   Liver Function Tests: Recent Labs  Lab 03/29/21 2239  AST 17  ALT 11  ALKPHOS 72  BILITOT 0.5  PROT 6.0*  ALBUMIN 2.7*   No results for input(s): LIPASE, AMYLASE in the last 168 hours. CBC: Recent Labs  Lab 03/29/21 2239 03/31/21 0428  WBC 8.6 9.8  NEUTROABS 6.2  --   HGB 7.8* 7.1*  HCT 24.4* 22.4*  MCV 94.2 93.3  PLT 372 324   Blood Culture    Component Value Date/Time    SDES BLOOD RIGHT HAND 03/16/2021 0046   SPECREQUEST  03/16/2021 0046    BOTTLES DRAWN AEROBIC ONLY Blood Culture results may not be optimal due to an inadequate volume of blood received in culture bottles   CULT  03/16/2021 0046    NO GROWTH 5 DAYS Performed at [facility], [place] [address] [place], [place] [address]    REPTSTATUS 03/21/2021 FINAL 03/16/2021 0046    Cardiac Enzymes: No results for input(s): CKTOTAL, CKMB, CKMBINDEX, TROPONINI in the last 168 hours. CBG: Recent Labs  Lab 03/29/21 2314 03/30/21 1334 03/30/21 1620 03/30/21 2153 03/31/21 0747  GLUCAP 166* 141* 189* 160* 112*   Iron Studies: No results for input(s): IRON, TIBC, TRANSFERRIN, FERRITIN in the last 72 hours. '@lablastinr3'$ @ Studies/Results: CT ABDOMEN PELVIS WO CONTRAST  Result Date: 03/30/2021 CLINICAL DATA:  Nonlocalized acute abdominal pain. EXAM: CT ABDOMEN AND PELVIS WITHOUT CONTRAST TECHNIQUE: Multidetector CT imaging of the abdomen and pelvis was performed following the standard protocol without IV contrast. COMPARISON:  CT a abdomen pelvis 01/01/2020, CT abdomen pelvis 03/14/2021 FINDINGS: Lower chest: Interval increase in small right pleural effusion and interval increase in trace to small left pleural effusion. Associated passive atelectasis of the bilateral lower lobes. Mitral annular calcifications. Findings suggestive of anemia. Thoracic aorta calcifications severe. Hepatobiliary:  No focal liver abnormality. Calcified gallstones within the gallbladder lumen. No gallbladder wall thickening or pericholecystic fluid. No biliary dilatation. Pancreas: No focal lesion. Normal pancreatic contour. No surrounding inflammatory changes. No main pancreatic ductal dilatation. Spleen: Normal in size without focal abnormality. Adrenals/Urinary Tract: No adrenal nodule bilaterally. Atrophic left kidney. No nephrolithiasis, no hydronephrosis, and no contour-deforming renal mass. No ureterolithiasis or hydroureter.  The urinary bladder is unremarkable. Stomach/Bowel: Stomach is within normal limits. No evidence of bowel wall thickening or dilatation. Diffuse sigmoid diverticulosis. Circumferential rectosigmoid bowel wall thickening likely due to under distension. No perisigmoid or perirectal fat stranding. No pneumatosis. The appendix is not definitely identified. Vascular/Lymphatic: No abdominal aorta or iliac aneurysm. Severe atherosclerotic plaque of the aorta and its branches. No abdominal, pelvic, or inguinal lymphadenopathy. Reproductive: Uterus and bilateral adnexa are unremarkable. Other: No intraperitoneal free fluid. No intraperitoneal free gas. No organized fluid collection. Musculoskeletal: No abdominal wall hernia or abnormality. No suspicious lytic or blastic osseous lesions. No acute displaced fracture. Multilevel degenerative changes of the spine. IMPRESSION: 1. Interval increase in size of small right and trace to small left pleural effusions. 2. Cholelithiasis with no CT findings of acute cholecystitis or choledocholithiasis. 3. Diffuse sigmoid diverticulosis with no acute diverticulitis. 4. Atrophic left kidney. 5. Circumferential rectosigmoid bowel wall thickening likely due to underdistension. Limited evaluation due to noncontrast study. 6. Aortic Atherosclerosis (ICD10-I70.0) - severe as well as mitral annular calcifications. Electronically Signed   By: Iven Finn M.D.   On: 03/30/2021 06:48   DG Chest 2 View  Result Date: 03/29/2021 CLINICAL DATA:  Anemia, chest pain, weakness. EXAM: CHEST - 2 VIEW COMPARISON:  Chest x-rays dated 03/22/2021 and 03/14/2021. FINDINGS: Probable mild bibasilar atelectasis and/or small pleural effusions, however, improved aeration at the RIGHT lung base compared to most recent chest x-ray of 03/22/2021. No new lung findings. No pneumothorax is seen. Heart size and mediastinal contours are stable. Dialysis catheter is stable in position. Extensive aortic  atherosclerosis. No acute appearing osseous abnormality. IMPRESSION: Probable mild bibasilar atelectasis and/or small bilateral pleural effusions. No evidence of pneumonia or pulmonary edema. Electronically Signed   By: Franki Cabot M.D.   On: 03/29/2021 23:55   Medications:  . acidophilus  1 capsule Oral Daily  . aspirin EC  81 mg Oral q AM  . carvedilol  6.25 mg Oral BID  . Chlorhexidine Gluconate Cloth  6 each Topical Daily  . ferric citrate  210 mg Oral TID WC  . fidaxomicin  200 mg Oral BID  . insulin aspart  0-6 Units Subcutaneous TID WC  . insulin glargine  12 Units Subcutaneous q AM  . pantoprazole  40 mg Oral Daily  . sodium chloride flush  3 mL Intravenous Q12H   HD orders: [place] T,Th,S 3.5 hrs 180NRe 400/500 73 kg 3.0K/2.5Ca UFP 2 via TDC -No Heparin -Mircera 100 mcg IV q 2 weeks (last dose 03/29/2021 Last HGB 06/02 7.1  Assessment/Plan: 1. C diff colitis-per primary 2. Elevated troponin-low flat trend . Cardiology has been consulted. Low HGB-will transfuse. Statin therapy without efficiency in patients who have already started HD. (2021 KDIGO guidelines).  Per primary 3. Hypokalemia-K+ 2.8. This is chronic issue but lower after diarrhea. Will supplement with KDUR 40 meq today. Use 4.0 K bath with HD.  4. ESRD -No acute HD needs today. HD tomorrow on schedule. No heparin. 5. Anemia - HGB 7.1. FOBT 06/05 negative. Will transfuse 1 unit PRBCs with HD tomorrow. Recent OP ESA dose 03/29/21 6.  Secondary hyperparathyroidism - Continue Auryxia binder. No VDRA.  7. HTN/volume - Wt 72.6 kg which is close to post wt 03/29/21. Challenge EDW tomorrow and lower EDW. Na 129 but has been on liquid diet. Should improve with volume removal.  8. Nutrition - Changed to renal/Carb mod diet. Albumin low. Add protein supps. 9. DMT2-Per primary  Jimmye Norman. Claretha Townshend NP-C 03/31/2021, 10:53 AM  [employer] [phone]

## 2021-03-31 NOTE — Progress Notes (Signed)
  Echocardiogram 2D Echocardiogram has been performed.  Anna Carr 03/31/2021, 11:38 AM

## 2021-03-31 NOTE — Progress Notes (Signed)
Initial Nutrition Assessment  DOCUMENTATION CODES:  Not applicable  INTERVENTION:  Add Ensure Enlive po daily, each supplement provides 350 kcal and 20 grams of protein.  Discontinue Nepro shakes TID.  Add Magic cup TID with meals, each supplement provides 290 kcal and 9 grams of protein.  Add Rena-Vite daily.  NUTRITION DIAGNOSIS:  Increased nutrient needs related to acute illness as evidenced by estimated needs.  GOAL:  Patient will meet greater than or equal to 90% of their needs  MONITOR:  PO intake,Supplement acceptance,Labs,Weight trends,I & O's  REASON FOR ASSESSMENT:  Consult Assessment of nutrition requirement/status  ASSESSMENT:  80 yo female with a PMH of chronic diastolic CHF, HTN, HLD, [id number], ESRD, mitral regurgitation, and palpitations who presents with chest pain with mixed features/elevated troponin and c.difficile infection.  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 She did have an elevated troponin during that hospitalization. Since she left, her Hgb dropped and she was having abdominal pain and bloating every time she ate. She felt very nauseated all day the last 2 days. At HD, she was told that her Hgb was lower and that if she felt tired she should come to the hospital. She got home, ate, took a nap, and had diarrhea. The diarrhea was watery and tarry - but "it's always dark."  It has lightened up overnight. She has had 4-5 episodes of diarrhea in the ER. She has had some chest tightness with nausea. She has had SOB during the episodes when she was nauseated during the week."  Spoke with pt's family as pt finished up Echo. Pt's daughter and son reported that pt has been eating well prior to surgery last week and that she is very conscious of her conditions - measuring fluid intake and maintaining a low sodium, renal diet at home.  Per daughter, pt's weight has gone up  with fluid accumulation. She reports that pt's dry weight will be updated tomorrow. Current dry weight is 73 kg. Given that most recent wt is 72.6 kg, pt has lost true weight.  On exam, pt has no outright depletions.  Per Epic, pt's weight has steadily increased since 10/2019.  Given above, pt is at high risk for acute malnutrition, but she is not currently malnourished.  Recommend adding Magic Cup TID and Ensure daily to promote intake. Also recommend adding Rena-Vite daily. Given pt's low lab values, Nepro shakes may not benefit patient at the moment; Ensure will provide better benefits while pt's labs are low.  Medications: reviewed; ferric citrate, Dificid, SSI, lantus, Protonix  Labs: reviewed; Na 129, K 2.8, CBG 112-189 HbA1c: 6.9% (02/2021)  NUTRITION - FOCUSED PHYSICAL EXAM: Flowsheet Row Most Recent Value  Orbital Region No depletion  Upper Arm Region No depletion  Thoracic and Lumbar Region No depletion  Buccal Region No depletion  Temple Region No depletion  Clavicle Bone Region No depletion  Clavicle and Acromion Bone Region No depletion  Scapular Bone Region No depletion  Dorsal Hand No depletion  Patellar Region No depletion  Anterior Thigh Region No depletion  Posterior Calf Region No depletion  Edema (RD Assessment) None  Hair Reviewed  Eyes Reviewed  Mouth Reviewed  Skin Reviewed  Nails Reviewed     Diet Order:   Diet Order            Diet renal/carb modified with fluid restriction Diet-HS Snack? Nothing; Fluid restriction: 1200 mL Fluid; Room service  appropriate? Yes; Fluid consistency: Thin  Diet effective now                EDUCATION NEEDS:  Education needs have been addressed  Skin:  Skin Assessment: Skin Integrity Issues: Skin Integrity Issues:: Incisions Incisions: L arm, closed  Last BM:  03/30/21 - Type 6, small  Height:  Ht Readings from Last 1 Encounters:  03/30/21 5' 1.5" (1.562 m)   Weight:  Wt Readings from Last 1 Encounters:   03/30/21 72.6 kg   Ideal Body Weight:  48.9 kg  BMI:  Body mass index is 29.76 kg/m.  Estimated Nutritional Needs:  Kcal:  1450-1650 Protein:  85-100 grams Fluid:  >1.45 L  Derrel Nip, RD, LDN Registered Dietitian I After-Hours/Weekend Pager # in [place]

## 2021-04-01 ENCOUNTER — Other Ambulatory Visit (HOSPITAL_COMMUNITY): Payer: Self-pay

## 2021-04-01 DIAGNOSIS — I5032 Chronic diastolic (congestive) heart failure: Secondary | ICD-10-CM

## 2021-04-01 DIAGNOSIS — R778 Other specified abnormalities of plasma proteins: Secondary | ICD-10-CM

## 2021-04-01 LAB — GLUCOSE, CAPILLARY
Glucose-Capillary: 108 mg/dL — ABNORMAL HIGH (ref 70–99)
Glucose-Capillary: 130 mg/dL — ABNORMAL HIGH (ref 70–99)
Glucose-Capillary: 156 mg/dL — ABNORMAL HIGH (ref 70–99)

## 2021-04-01 LAB — CBC
HCT: 21.9 % — ABNORMAL LOW (ref 36.0–46.0)
Hemoglobin: 7 g/dL — ABNORMAL LOW (ref 12.0–15.0)
MCH: 29.5 pg (ref 26.0–34.0)
MCHC: 32 g/dL (ref 30.0–36.0)
MCV: 92.4 fL (ref 80.0–100.0)
Platelets: 310 10*3/uL (ref 150–400)
RBC: 2.37 MIL/uL — ABNORMAL LOW (ref 3.87–5.11)
RDW: 15.8 % — ABNORMAL HIGH (ref 11.5–15.5)
WBC: 7.4 10*3/uL (ref 4.0–10.5)
nRBC: 0 % (ref 0.0–0.2)

## 2021-04-01 LAB — COMPREHENSIVE METABOLIC PANEL
ALT: 11 U/L (ref 0–44)
AST: 14 U/L — ABNORMAL LOW (ref 15–41)
Albumin: 2.5 g/dL — ABNORMAL LOW (ref 3.5–5.0)
Alkaline Phosphatase: 63 U/L (ref 38–126)
Anion gap: 9 (ref 5–15)
BUN: 29 mg/dL — ABNORMAL HIGH (ref 8–23)
CO2: 25 mmol/L (ref 22–32)
Calcium: 8.2 mg/dL — ABNORMAL LOW (ref 8.9–10.3)
Chloride: 93 mmol/L — ABNORMAL LOW (ref 98–111)
Creatinine, Ser: 4.25 mg/dL — ABNORMAL HIGH (ref 0.44–1.00)
GFR, Estimated: 10 mL/min — ABNORMAL LOW (ref >60)
Glucose, Bld: 132 mg/dL — ABNORMAL HIGH (ref 70–99)
Potassium: 3 mmol/L — ABNORMAL LOW (ref 3.5–5.1)
Sodium: 127 mmol/L — ABNORMAL LOW (ref 135–145)
Total Bilirubin: 0.2 mg/dL — ABNORMAL LOW (ref 0.3–1.2)
Total Protein: 5.4 g/dL — ABNORMAL LOW (ref 6.5–8.1)

## 2021-04-01 LAB — TYPE AND SCREEN
ABO/RH(D): A POS
Antibody Screen: NEGATIVE

## 2021-04-01 MED ORDER — SORBITOL 70 % SOLN
30.0000 mL | Status: AC
  Administered 2021-04-01: 30 mL via ORAL
  Filled 2021-04-01: qty 30

## 2021-04-01 MED ORDER — PENTAFLUOROPROP-TETRAFLUOROETH EX AERO: 1.0000 "application " | INHALATION_SPRAY | CUTANEOUS | Status: DC | PRN

## 2021-04-01 MED ORDER — HEPARIN SODIUM (PORCINE) 1000 UNIT/ML IJ SOLN
INTRAMUSCULAR | Status: AC
  Administered 2021-04-01: 3200 [IU] via INTRAVENOUS_CENTRAL
  Filled 2021-04-01: qty 4

## 2021-04-01 MED ORDER — HEPARIN SODIUM (PORCINE) 1000 UNIT/ML DIALYSIS: 1000.0000 [IU] | INTRAMUSCULAR | Status: DC | PRN

## 2021-04-01 MED ORDER — ALTEPLASE 2 MG IJ SOLR: 2.0000 mg | Freq: Once | INTRAMUSCULAR | Status: DC | PRN

## 2021-04-01 MED ORDER — LIDOCAINE HCL (PF) 1 % IJ SOLN: 5.0000 mL | INTRAMUSCULAR | Status: DC | PRN

## 2021-04-01 MED ORDER — FIDAXOMICIN 200 MG PO TABS
200.0000 mg | ORAL_TABLET | Freq: Two times a day (BID) | ORAL | 0 refills | Status: AC
  Filled 2021-04-01: qty 14, 7d supply, fill #0

## 2021-04-01 MED ORDER — SODIUM CHLORIDE 0.9 % IV SOLN: 100.0000 mL | INTRAVENOUS | Status: DC | PRN

## 2021-04-01 MED ORDER — LIDOCAINE-PRILOCAINE 2.5-2.5 % EX CREA: 1.0000 "application " | TOPICAL_CREAM | CUTANEOUS | Status: DC | PRN

## 2021-04-01 NOTE — Discharge Summary (Signed)
Physician Discharge Summary  Anna Carr [id number] DOB: 18-Feb-1941 DOA: 03/29/2021  PCP: Raina Mina., MD  Admit date: 03/29/2021 Discharge date: 04/01/2021  Admitted From: Home Disposition:  Home  Recommendations for Outpatient Follow-up:  1. Follow up with PCP in 1-2 weeks 2. Follow up with HD as scheduled  Discharge Condition:Improved CODE STATUS:Full Diet recommendation: Diabetic, renal   Brief/Interim Summary: 80 y.o.femalewith medical history significant ofDM; SIADH; HTN; HLD; COPD; ESRD on TTS HD; and chronic diastolic CHF presenting with abdominal pain, tarry stools. 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 She did have an elevated troponin during that hospitalization. Since she left, her Hgb dropped and she was having abdominal pain and bloating every time she ate. She felt very nauseated all day the last 2 days. At HD, she was told that her Hgb was lower and that if she felt tired she should come to the hospital. She got home, ate, took a nap, and had diarrhea. The diarrhea was watery and tarry - but "it's always dark." It has lightened up overnight. She has had 4-5 episodes of diarrhea in the ER. She has had some chest tightness with nausea. She has had SOB during the episodes when she was nauseated during the week.   Discharge Diagnoses:  Principal Problem:   C. difficile colitis Active Problems:   HLD (hyperlipidemia)   Type 2 diabetes mellitus with chronic kidney disease on chronic dialysis, with long-term current use of insulin ([name])   Chronic diastolic heart failure ([name])   End stage renal disease ([name])   Hypertension   Elevated troponin   Cholelithiasis  C Diff Colitis at time of presentation -Patient with recent hospitalization for GI bleeding -C diff POSITIVE at time of presentation - Dificid BID x 10 days ordered -On probiotics -recommend d/c protonix  while treating cdiff -Successfully advanced diet  Elevated troponin -Patient with troponin elevation at [facility] previously, 0.36 -> 0.68 -> 0.71 -Seen by Cardiology, likely demand mismatch with trop trending down -2d echo reviewed, no wall motion abnormality -cardiology has since signed off  ESRD on HD -Patient on chronic TTS HD -Nephrology was consulted this admit  Cholelithiasis -Gallstones noted on CT without evidence of cholecystitis or choledocholithiasis -tolerating diet  Recent GI bleed -Heme negative -Hgb 7.8, down from 8.6 -Outpatient GI f/u  -stopped protonix given cdiff -repeat CBC in AM  DM -Recent A1c was 6.9, indicating good control -Continue glargine -Hold Amaryl while in hospital -Cover withvery sensitive-scale SSI  HTN -Continue Coreg  HLD -Hold fish oil due to limited inpatient utility  Chronic diastolic CHF -Appears compensated -Volume control with HD  Hyponatremia -chronic -Discussed with Nephrology. Cont to manage on HD   Discharge Instructions   Allergies as of 04/01/2021   No Known Allergies     Medication List    STOP taking these medications   pantoprazole 40 MG tablet Commonly known as: Protonix     TAKE these medications   ascorbic acid 1000 MG tablet Commonly known as: VITAMIN C Take 1,000 mg by mouth daily in the afternoon.   aspirin 81 MG EC tablet Take 81 mg by mouth in the morning.   Biotin 5000 MCG Tabs Take 5,000 mcg by mouth daily in the afternoon.   carboxymethylcellulose 0.5 % Soln Commonly known as: REFRESH PLUS Place 1 drop into both eyes 3 (three) times daily as needed (dry eyes).   carvedilol 6.25 MG tablet Commonly  known as: COREG Take 6.25 mg by mouth 2 (two) times daily.   ferric citrate 1 GM 210 MG(Fe) tablet Commonly known as: AURYXIA Take 210 mg by mouth 3 (three) times daily with meals.   fidaxomicin 200 MG Tabs tablet Commonly known as: DIFICID Take 1 tablet (200 mg total) by mouth  2 (two) times daily for 7 days.   Fish Oil 1000 MG Caps Take 1,000 mg by mouth in the morning and at bedtime.   glimepiride 4 MG tablet Commonly known as: AMARYL Take 4 mg by mouth 2 (two) times daily.   HYDROcodone-acetaminophen 5-325 MG tablet Commonly known as: Norco Take 1 tablet by mouth every 6 (six) hours as needed for moderate pain.   lidocaine-prilocaine cream Commonly known as: EMLA Apply 1 application topically as needed (prior to fisula access).   polyethylene glycol 17 g packet Commonly known as: MIRALAX / GLYCOLAX Take 17 g by mouth daily as needed for moderate constipation.   PROBIOTIC ACIDOPHILUS PO Take 1 capsule by mouth in the morning.   QC Vitamin D3 125 MCG (5000 UT) Tabs Generic drug: Cholecalciferol Take 5,000 Units by mouth in the morning.   Soliqua 100-33 UNT-MCG/ML Sopn Generic drug: Insulin Glargine-Lixisenatide Inject 12 Units into the skin in the morning.   VITAMIN B-12 PO Take 1 capsule by mouth every evening.   Vitamin E 400 units Tabs Take 800 Units by mouth in the morning.       Follow-up Information    Raina Mina., MD. Schedule an appointment as soon as possible for a visit in 2 week(s).   Specialty: Internal Medicine Contact information: [place] [address] [phone]        Berniece Salines, DO .   Specialty: Cardiology Contact information: [place] [address] [phone]              No Known Allergies  Consultations:  Nephrology  Procedures/Studies: CT ABDOMEN PELVIS WO CONTRAST  Result Date: 03/30/2021 CLINICAL DATA:  Nonlocalized acute abdominal pain. EXAM: CT ABDOMEN AND PELVIS WITHOUT CONTRAST TECHNIQUE: Multidetector CT imaging of the abdomen and pelvis was performed following the standard protocol without IV contrast. COMPARISON:  CT a abdomen pelvis 01/01/2020, CT abdomen pelvis 03/14/2021 FINDINGS: Lower chest: Interval increase in small right pleural effusion and  interval increase in trace to small left pleural effusion. Associated passive atelectasis of the bilateral lower lobes. Mitral annular calcifications. Findings suggestive of anemia. Thoracic aorta calcifications severe. Hepatobiliary: No focal liver abnormality. Calcified gallstones within the gallbladder lumen. No gallbladder wall thickening or pericholecystic fluid. No biliary dilatation. Pancreas: No focal lesion. Normal pancreatic contour. No surrounding inflammatory changes. No main pancreatic ductal dilatation. Spleen: Normal in size without focal abnormality. Adrenals/Urinary Tract: No adrenal nodule bilaterally. Atrophic left kidney. No nephrolithiasis, no hydronephrosis, and no contour-deforming renal mass. No ureterolithiasis or hydroureter. The urinary bladder is unremarkable. Stomach/Bowel: Stomach is within normal limits. No evidence of bowel wall thickening or dilatation. Diffuse sigmoid diverticulosis. Circumferential rectosigmoid bowel wall thickening likely due to under distension. No perisigmoid or perirectal fat stranding. No pneumatosis. The appendix is not definitely identified. Vascular/Lymphatic: No abdominal aorta or iliac aneurysm. Severe atherosclerotic plaque of the aorta and its branches. No abdominal, pelvic, or inguinal lymphadenopathy. Reproductive: Uterus and bilateral adnexa are unremarkable. Other: No intraperitoneal free fluid. No intraperitoneal free gas. No organized fluid collection. Musculoskeletal: No abdominal wall hernia or abnormality. No suspicious lytic or blastic osseous lesions. No acute displaced fracture.  Multilevel degenerative changes of the spine. IMPRESSION: 1. Interval increase in size of small right and trace to small left pleural effusions. 2. Cholelithiasis with no CT findings of acute cholecystitis or choledocholithiasis. 3. Diffuse sigmoid diverticulosis with no acute diverticulitis. 4. Atrophic left kidney. 5. Circumferential rectosigmoid bowel wall  thickening likely due to underdistension. Limited evaluation due to noncontrast study. 6. Aortic Atherosclerosis (ICD10-I70.0) - severe as well as mitral annular calcifications. Electronically Signed   By: Iven Finn M.D.   On: 03/30/2021 06:48   DG Chest 1 View  Result Date: 03/22/2021 CLINICAL DATA:  Pneumonia. EXAM: CHEST  1 VIEW COMPARISON:  Mar 22, 2021 FINDINGS: Dual lumen right central venous catheter in stable position. Cardiomediastinal silhouette is normal. Mediastinal contours appear intact. Bilateral pleural effusions, small. No definite focal airspace consolidation is seen radiographically. Osseous structures are without acute abnormality. Soft tissues are grossly normal. IMPRESSION: Bilateral pleural effusions, small. Electronically Signed   By: Fidela Salisbury M.D.   On: 03/22/2021 15:28   DG Chest 2 View  Result Date: 03/29/2021 CLINICAL DATA:  Anemia, chest pain, weakness. EXAM: CHEST - 2 VIEW COMPARISON:  Chest x-rays dated 03/22/2021 and 03/14/2021. FINDINGS: Probable mild bibasilar atelectasis and/or small pleural effusions, however, improved aeration at the RIGHT lung base compared to most recent chest x-ray of 03/22/2021. No new lung findings. No pneumothorax is seen. Heart size and mediastinal contours are stable. Dialysis catheter is stable in position. Extensive aortic atherosclerosis. No acute appearing osseous abnormality. IMPRESSION: Probable mild bibasilar atelectasis and/or small bilateral pleural effusions. No evidence of pneumonia or pulmonary edema. Electronically Signed   By: Franki Cabot M.D.   On: 03/29/2021 23:55   ECHOCARDIOGRAM COMPLETE  Result Date: 03/31/2021    ECHOCARDIOGRAM REPORT   Patient Name:   Anna Carr Date of Exam: 03/31/2021 Medical Rec #:  [id number]         Height:       61.5 in Accession #:    [id number]        Weight:       160.1 lb Date of Birth:  [birth year]         BSA:          1.729 m Patient Age:    79 years          BP:            126/62 mmHg Patient Gender: F                 HR:           81 bpm. Exam Location:  Inpatient Procedure: 2D Echo, Cardiac Doppler and Color Doppler Indications:    Chest pain  History:        Patient has prior history of Echocardiogram examinations, most                 recent 11/14/2019. CHF, Mitral Valve Disease; Risk                 Factors:Hypertension, Dyslipidemia and Diabetes. ESRD.  Sonographer:    Clayton Lefort RDCS (AE) Referring Phys: [id number] [place]  1. Left ventricular ejection fraction, by estimation, is 55 to 60%. The left ventricle has normal function. The left ventricle has no regional wall motion abnormalities. There is moderate concentric left ventricular hypertrophy. Left ventricular diastolic parameters are consistent with Grade III diastolic dysfunction (restrictive).  2. Right ventricular systolic function is normal. The right ventricular size is normal. There  is mildly elevated pulmonary artery systolic pressure.  3. Left atrial size was severely dilated.  4. The mitral valve is abnormal. Mild to moderate mitral valve regurgitation. Mild mitral stenosis. Severe mitral annular calcification.  5. The aortic valve is tricuspid. There is moderate calcification of the aortic valve. There is moderate thickening of the aortic valve. Aortic valve regurgitation is not visualized. Mild to moderate aortic valve sclerosis/calcification is present, without any evidence of aortic stenosis.  6. The inferior vena cava is normal in size with greater than 50% respiratory variability, suggesting right atrial pressure of 3 mmHg. Comparison(s): Prior images unable to be directly viewed, comparison made by report only. No significant change from prior study. FINDINGS  Left Ventricle: Left ventricular ejection fraction, by estimation, is 55 to 60%. The left ventricle has normal function. The left ventricle has no regional wall motion abnormalities. The left ventricular internal cavity size was  normal in size. There is  moderate concentric left ventricular hypertrophy. Left ventricular diastolic parameters are consistent with Grade III diastolic dysfunction (restrictive). Right Ventricle: The right ventricular size is normal. Right vetricular wall thickness was not well visualized. Right ventricular systolic function is normal. There is mildly elevated pulmonary artery systolic pressure. The tricuspid regurgitant velocity  is 2.93 m/s, and with an assumed right atrial pressure of 3 mmHg, the estimated right ventricular systolic pressure is 123XX123 mmHg. Left Atrium: Left atrial size was severely dilated. Right Atrium: Right atrial size was not well visualized. Pericardium: There is no evidence of pericardial effusion. Mitral Valve: The mitral valve is abnormal. There is moderate thickening of the mitral valve leaflet(s). There is moderate calcification of the mitral valve leaflet(s). Severe mitral annular calcification. Mild to moderate mitral valve regurgitation. Mild mitral valve stenosis. MV peak gradient, 14.0 mmHg. The mean mitral valve gradient is 5.0 mmHg with average heart rate of 75 bpm. Tricuspid Valve: The tricuspid valve is grossly normal. Tricuspid valve regurgitation is mild. Aortic Valve: The aortic valve is tricuspid. There is moderate calcification of the aortic valve. There is moderate thickening of the aortic valve. Aortic valve regurgitation is not visualized. Mild to moderate aortic valve sclerosis/calcification is present, without any evidence of aortic stenosis. Aortic valve mean gradient measures 7.0 mmHg. Aortic valve peak gradient measures 11.3 mmHg. Aortic valve area, by VTI measures 1.30 cm. Pulmonic Valve: The pulmonic valve was not well visualized. Pulmonic valve regurgitation is not visualized. No evidence of pulmonic stenosis. Aorta: The aortic root and ascending aorta are structurally normal, with no evidence of dilitation. Venous: The inferior vena cava is normal in size  with greater than 50% respiratory variability, suggesting right atrial pressure of 3 mmHg. IAS/Shunts: The interatrial septum was not well visualized.  LEFT VENTRICLE PLAX 2D LVIDd:         5.00 cm  Diastology LVIDs:         3.30 cm  LV e' medial:    4.68 cm/s LV PW:         1.30 cm  LV E/e' medial:  36.5 LV IVS:        1.50 cm  LV e' lateral:   10.40 cm/s LVOT diam:     1.90 cm  LV E/e' lateral: 16.4 LV SV:         59 LV SV Index:   34 LVOT Area:     2.84 cm  RIGHT VENTRICLE             IVC RV Basal diam:  3.30 cm  IVC diam: 1.80 cm RV S prime:     10.20 cm/s TAPSE (M-mode): 2.1 cm LEFT ATRIUM            Index       RIGHT ATRIUM           Index LA diam:      4.80 cm  2.78 cm/m  RA Area:     14.10 cm LA Vol (A4C): 108.0 ml 62.47 ml/m RA Volume:   36.50 ml  21.11 ml/m  AORTIC VALVE AV Area (Vmax):    1.31 cm AV Area (Vmean):   1.38 cm AV Area (VTI):     1.30 cm AV Vmax:           168.00 cm/s AV Vmean:          124.000 cm/s AV VTI:            0.453 m AV Peak Grad:      11.3 mmHg AV Mean Grad:      7.0 mmHg LVOT Vmax:         77.70 cm/s LVOT Vmean:        60.500 cm/s LVOT VTI:          0.208 m LVOT/AV VTI ratio: 0.46  AORTA Ao Root diam: 2.90 cm Ao Asc diam:  3.00 cm MITRAL VALVE                 TRICUSPID VALVE MV Area (PHT): 3.53 cm      TR Peak grad:   34.3 mmHg MV Area VTI:   1.39 cm      TR Vmax:        293.00 cm/s MV Peak grad:  14.0 mmHg MV Mean grad:  5.0 mmHg      SHUNTS MV Vmax:       1.87 m/s      Systemic VTI:  0.21 m MV Vmean:      98.8 cm/s     Systemic Diam: 1.90 cm MV Decel Time: 215 msec MR Peak grad:    109.8 mmHg MR Mean grad:    72.0 mmHg MR Vmax:         524.00 cm/s MR Vmean:        404.0 cm/s MR PISA:         1.57 cm MR PISA Eff ROA: 12 mm MR PISA Radius:  0.50 cm MV E velocity: 171.00 cm/s MV A velocity: 72.80 cm/s MV E/A ratio:  2.35 Buford Dresser MD Electronically signed by Buford Dresser MD Signature Date/Time: 03/31/2021/1:42:49 PM    Final    VAS [place] UPPER  EXTREMITY VENOUS DUPLEX  Result Date: 03/16/2021 UPPER VENOUS STUDY  Patient Name:  AVAIA NISKANEN  Date of Exam:   03/16/2021 Medical Rec #: [id number]          Accession #:    [id number] Date of Birth: 1941/09/18          Patient Gender: F Patient Age:   48Y Exam Location:  [facility] Procedure:      VAS [place] UPPER EXTREMITY VENOUS DUPLEX Referring Phys: [id number] Justice T TU --------------------------------------------------------------------------------  Indications: left upper extremity pain and edema s/p LUE AVG placement 03/12/21 Limitations: Bandages and open wound. Comparison Study: No prior study Performing Technologist: Maudry Mayhew MHA, RDMS, RVT, RDCS  Examination Guidelines: A complete evaluation includes B-mode imaging, spectral Doppler, color Doppler, and power Doppler as needed of all accessible portions of each vessel. Bilateral testing is considered  an integral part of a complete examination. Limited examinations for reoccurring indications may be performed as noted.  Right Findings: +----------+------------+---------+-----------+----------+---------------------+ RIGHT     CompressiblePhasicitySpontaneousProperties       Summary        +----------+------------+---------+-----------+----------+---------------------+ Subclavian                                           Unable to visualize                                                        due to bandaging    +----------+------------+---------+-----------+----------+---------------------+  Left Findings: +----------+------------+---------+-----------+----------+--------------+ LEFT      CompressiblePhasicitySpontaneousProperties   Summary     +----------+------------+---------+-----------+----------+--------------+ IJV           Full       Yes       Yes                             +----------+------------+---------+-----------+----------+--------------+ Subclavian    Full       Yes       Yes                              +----------+------------+---------+-----------+----------+--------------+ Axillary      Full       Yes       Yes                             +----------+------------+---------+-----------+----------+--------------+ Brachial      Full       Yes       Yes                             +----------+------------+---------+-----------+----------+--------------+ Radial        Full                                                 +----------+------------+---------+-----------+----------+--------------+ Ulnar         Full                                                 +----------+------------+---------+-----------+----------+--------------+ Cephalic                                            Not visualized +----------+------------+---------+-----------+----------+--------------+ Basilic       Full                                                 +----------+------------+---------+-----------+----------+--------------+  Summary:  Left: No evidence  of deep vein thrombosis in the upper extremity. No evidence of superficial vein thrombosis in the upper extremity.  *See table(s) above for measurements and observations.  Diagnosing physician: Deitra Mayo MD Electronically signed by Deitra Mayo MD on 03/16/2021 at 1:19:35 PM.    Final      Subjective: Eager to go home  Discharge Exam: Vitals:   04/01/21 1235 04/01/21 1340  BP: (!) 151/57 127/70  Pulse: 83 79  Resp: 20 16  Temp: 97.9 F (36.6 C) 97.7 F (36.5 C)  SpO2: 98% 100%   Vitals:   04/01/21 1200 04/01/21 1230 04/01/21 1235 04/01/21 1340  BP: (!) 131/49 (!) 131/58 (!) 151/57 127/70  Pulse: 82 82 83 79  Resp:   20 16  Temp:   97.9 F (36.6 C) 97.7 F (36.5 C)  TempSrc:   Oral   SpO2:   98% 100%  Weight:   72.7 kg   Height:        General: Pt is alert, awake, not in acute distress Cardiovascular: RRR, S1/S2 +, no rubs, no gallops Respiratory: CTA bilaterally, no  wheezing, no rhonchi Abdominal: Soft, NT, ND, bowel sounds + Extremities: no edema, no cyanosis   The results of significant diagnostics from this hospitalization (including imaging, microbiology, ancillary and laboratory) are listed below for reference.     Microbiology: Recent Results (from the past 240 hour(s))  SARS CORONAVIRUS 2 (TAT 6-24 HRS) Nasopharyngeal Nasopharyngeal Swab     Status: None   Collection Time: 03/30/21  6:53 AM   Specimen: Nasopharyngeal Swab  Result Value Ref Range Status   SARS Coronavirus 2 NEGATIVE NEGATIVE Final    Comment: (NOTE) SARS-CoV-2 target nucleic acids are NOT DETECTED.  The SARS-CoV-2 RNA is generally detectable in upper and lower respiratory specimens during the acute phase of infection. Negative results do not preclude SARS-CoV-2 infection, do not rule out co-infections with other pathogens, and should not be used as the sole basis for treatment or other patient management decisions. Negative results must be combined with clinical observations, patient history, and epidemiological information. The expected result is Negative.  Fact Sheet for Patients: SugarRoll.be  Fact Sheet for Healthcare Providers: https://www.woods-mathews.com/  This test is not yet approved or cleared by the [place] FDA and  has been authorized for detection and/or diagnosis of SARS-CoV-2 by FDA under an Emergency Use Authorization (EUA). This EUA will remain  in effect (meaning this test can be used) for the duration of the COVID-19 declaration under Se ction 564(b)(1) of the Act, 21 U.S.C. section 360bbb-3(b)(1), unless the authorization is terminated or revoked sooner.  Performed at [facility], [place] [address] [place], [place] [address]   Gastrointestinal Panel by PCR , Stool     Status: None   Collection Time: 03/30/21  7:45 AM   Specimen: Stool  Result Value Ref Range Status   Campylobacter  species NOT DETECTED NOT DETECTED Final   Plesimonas shigelloides NOT DETECTED NOT DETECTED Final   Salmonella species NOT DETECTED NOT DETECTED Final   Yersinia enterocolitica NOT DETECTED NOT DETECTED Final   Vibrio species NOT DETECTED NOT DETECTED Final   Vibrio cholerae NOT DETECTED NOT DETECTED Final   Enteroaggregative E coli (EAEC) NOT DETECTED NOT DETECTED Final   Enteropathogenic E coli (EPEC) NOT DETECTED NOT DETECTED Final   Enterotoxigenic E coli (ETEC) NOT DETECTED NOT DETECTED Final   Shiga like toxin producing E coli (STEC) NOT DETECTED NOT DETECTED Final   Shigella/Enteroinvasive E coli (EIEC)  NOT DETECTED NOT DETECTED Final   Cryptosporidium NOT DETECTED NOT DETECTED Final   Cyclospora cayetanensis NOT DETECTED NOT DETECTED Final   Entamoeba histolytica NOT DETECTED NOT DETECTED Final   Giardia lamblia NOT DETECTED NOT DETECTED Final   Adenovirus F40/41 NOT DETECTED NOT DETECTED Final   Astrovirus NOT DETECTED NOT DETECTED Final   Norovirus GI/GII NOT DETECTED NOT DETECTED Final   Rotavirus A NOT DETECTED NOT DETECTED Final   Sapovirus (I, II, IV, and V) NOT DETECTED NOT DETECTED Final    Comment: Performed at [facility], [place] [place], [place] [address]  C Difficile Quick Screen w PCR reflex     Status: Abnormal   Collection Time: 03/30/21  7:45 AM   Specimen: Stool  Result Value Ref Range Status   C Diff antigen POSITIVE (A) NEGATIVE Final   C Diff toxin POSITIVE (A) NEGATIVE Final   C Diff interpretation Toxin producing C. difficile detected.  Final    Comment: RESULTED WITH RN JANET AT [phone] NM Performed at [facility], [place] [address] [place], [place] [address]      Labs: BNP (last 3 results) No results for input(s): BNP in the last 8760 hours. Basic Metabolic Panel: Recent Labs  Lab 03/29/21 2239 03/31/21 0428 04/01/21 0422  NA 133* 129* 127*  K 3.6 2.8* 3.0*  CL 97* 94* 93*  CO2 '26 25 25  '$ GLUCOSE 192* 110* 132*  BUN  16 23 29*  CREATININE 2.75* 3.66* 4.25*  CALCIUM 8.5* 8.3* 8.2*   Liver Function Tests: Recent Labs  Lab 03/29/21 2239 04/01/21 0422  AST 17 14*  ALT 11 11  ALKPHOS 72 63  BILITOT 0.5 0.2*  PROT 6.0* 5.4*  ALBUMIN 2.7* 2.5*   No results for input(s): LIPASE, AMYLASE in the last 168 hours. No results for input(s): AMMONIA in the last 168 hours. CBC: Recent Labs  Lab 03/29/21 2239 03/31/21 0428 04/01/21 0422  WBC 8.6 9.8 7.4  NEUTROABS 6.2  --   --   HGB 7.8* 7.1* 7.0*  HCT 24.4* 22.4* 21.9*  MCV 94.2 93.3 92.4  PLT 372 324 310   Cardiac Enzymes: No results for input(s): CKTOTAL, CKMB, CKMBINDEX, TROPONINI in the last 168 hours. BNP: Invalid input(s): POCBNP CBG: Recent Labs  Lab 03/31/21 1425 03/31/21 1618 03/31/21 2021 04/01/21 0735 04/01/21 1303  GLUCAP 154* 147* 204* 108* 130*   D-Dimer No results for input(s): DDIMER in the last 72 hours. Hgb A1c No results for input(s): HGBA1C in the last 72 hours. Lipid Profile Recent Labs    03/31/21 0428  CHOL 162  HDL 37*  LDLCALC 88  TRIG 185*  CHOLHDL 4.4   Thyroid function studies No results for input(s): TSH, T4TOTAL, T3FREE, THYROIDAB in the last 72 hours.  Invalid input(s): FREET3 Anemia work up No results for input(s): VITAMINB12, FOLATE, FERRITIN, TIBC, IRON, RETICCTPCT in the last 72 hours. Urinalysis No results found for: COLORURINE, APPEARANCEUR, [place], [place], [place], [place], [place], [place], PROTEINUR, UROBILINOGEN, NITRITE, LEUKOCYTESUR Sepsis Labs Invalid input(s): PROCALCITONIN,  WBC,  LACTICIDVEN Microbiology Recent Results (from the past 240 hour(s))  SARS CORONAVIRUS 2 (TAT 6-24 HRS) Nasopharyngeal Nasopharyngeal Swab     Status: None   Collection Time: 03/30/21  6:53 AM   Specimen: Nasopharyngeal Swab  Result Value Ref Range Status   SARS Coronavirus 2 NEGATIVE NEGATIVE Final    Comment: (NOTE) SARS-CoV-2 target nucleic acids are NOT DETECTED.  The SARS-CoV-2 RNA is  generally detectable in upper  and lower respiratory specimens during the acute phase of infection. Negative results do not preclude SARS-CoV-2 infection, do not rule out co-infections with other pathogens, and should not be used as the sole basis for treatment or other patient management decisions. Negative results must be combined with clinical observations, patient history, and epidemiological information. The expected result is Negative.  Fact Sheet for Patients: SugarRoll.be  Fact Sheet for Healthcare Providers: https://www.woods-mathews.com/  This test is not yet approved or cleared by the [place] FDA and  has been authorized for detection and/or diagnosis of SARS-CoV-2 by FDA under an Emergency Use Authorization (EUA). This EUA will remain  in effect (meaning this test can be used) for the duration of the COVID-19 declaration under Se ction 564(b)(1) of the Act, 21 U.S.C. section 360bbb-3(b)(1), unless the authorization is terminated or revoked sooner.  Performed at [facility], [place] [address], [place], [place] [address]   Gastrointestinal Panel by PCR , Stool     Status: None   Collection Time: 03/30/21  7:45 AM   Specimen: Stool  Result Value Ref Range Status   Campylobacter species NOT DETECTED NOT DETECTED Final   Plesimonas shigelloides NOT DETECTED NOT DETECTED Final   Salmonella species NOT DETECTED NOT DETECTED Final   Yersinia enterocolitica NOT DETECTED NOT DETECTED Final   Vibrio species NOT DETECTED NOT DETECTED Final   Vibrio cholerae NOT DETECTED NOT DETECTED Final   Enteroaggregative E coli (EAEC) NOT DETECTED NOT DETECTED Final   Enteropathogenic E coli (EPEC) NOT DETECTED NOT DETECTED Final   Enterotoxigenic E coli (ETEC) NOT DETECTED NOT DETECTED Final   Shiga like toxin producing E coli (STEC) NOT DETECTED NOT DETECTED Final   Shigella/Enteroinvasive E coli (EIEC) NOT DETECTED NOT DETECTED Final    Cryptosporidium NOT DETECTED NOT DETECTED Final   Cyclospora cayetanensis NOT DETECTED NOT DETECTED Final   Entamoeba histolytica NOT DETECTED NOT DETECTED Final   Giardia lamblia NOT DETECTED NOT DETECTED Final   Adenovirus F40/41 NOT DETECTED NOT DETECTED Final   Astrovirus NOT DETECTED NOT DETECTED Final   Norovirus GI/GII NOT DETECTED NOT DETECTED Final   Rotavirus A NOT DETECTED NOT DETECTED Final   Sapovirus (I, II, IV, and V) NOT DETECTED NOT DETECTED Final    Comment: Performed at [facility], [address] [place], [place] [address]  C Difficile Quick Screen w PCR reflex     Status: Abnormal   Collection Time: 03/30/21  7:45 AM   Specimen: Stool  Result Value Ref Range Status   C Diff antigen POSITIVE (A) NEGATIVE Final   C Diff toxin POSITIVE (A) NEGATIVE Final   C Diff interpretation Toxin producing C. difficile detected.  Final    Comment: RESULTED WITH RN JANET AT [id number] NM Performed at [facility], [place] [address] [place], [place] [address]    Time spent: 28mn  SIGNED:   SMarylu Lund MD  [facility] 04/01/2021, 3:42 PM  If 7PM-7AM, please contact night-coverage

## 2021-04-01 NOTE — Progress Notes (Signed)
Progress Note  Patient Name: Anna Carr Date of Encounter: 04/01/2021  [facility] Cardiologist: Berniece Salines, DO    Subjective    80 yo with hx of chronic diastolic chf, anemia, GI bleed, ESRD, HTN admmitted with weakness, abdominal pain , tarry stoo.s. Troponin were mildly elevated but with a downward trend  Echo shows well preserved LV systolic function. Grade III DD.  + MR    Inpatient Medications    Scheduled Meds: . heparin sodium (porcine)      . sodium chloride   Intravenous Once  . acidophilus  1 capsule Oral Daily  . aspirin EC  81 mg Oral q AM  . carvedilol  6.25 mg Oral BID  . Chlorhexidine Gluconate Cloth  6 each Topical Daily  . feeding supplement  237 mL Oral Q24H  . ferric citrate  210 mg Oral TID WC  . fidaxomicin  200 mg Oral BID  . insulin aspart  0-6 Units Subcutaneous TID WC  . insulin glargine  12 Units Subcutaneous q AM  . multivitamin  1 tablet Oral QHS  . pantoprazole  40 mg Oral Daily  . sodium chloride flush  3 mL Intravenous Q12H   Continuous Infusions: . sodium chloride    . sodium chloride     PRN Meds: sodium chloride, sodium chloride, acetaminophen **OR** acetaminophen, alteplase, calcium carbonate (dosed in mg elemental calcium), camphor-menthol **AND** hydrOXYzine, docusate sodium, fentaNYL (SUBLIMAZE) injection, heparin, hydrALAZINE, HYDROcodone-acetaminophen, lidocaine (PF), lidocaine-prilocaine, ondansetron **OR** ondansetron (ZOFRAN) IV, pentafluoroprop-tetrafluoroeth, polyethylene glycol, polyvinyl alcohol, sorbitol, zolpidem   Vital Signs    Vitals:   04/01/21 1013 04/01/21 1030 04/01/21 1035 04/01/21 1100  BP: (!) 129/56 (!) 135/46 (!) 134/52 (!) 129/53  Pulse: 80 79 79 78  Resp: (!) 21  20   Temp: 98 F (36.7 C) 98.6 F (37 C) 98.6 F (37 C)   TempSrc: Oral Oral Oral   SpO2:      Weight:      Height:       No intake or output data in the 24 hours ending 04/01/21 1126 Last 3 Weights 04/01/2021 03/30/2021  03/30/2021  Weight (lbs) 165 lb 9.1 oz 160 lb 1.6 oz 157 lb 9.6 oz  Weight (kg) 75.1 kg 72.621 kg 71.487 kg      Telemetry    NSR  - Personally Reviewed  ECG     - Personally Reviewed  Physical Exam   GEN: chronically ill appearing female,  Examined in dialysis Neck: No JVD Cardiac: RRR, soft systolic murmur  Respiratory: Clear to auscultation bilaterally. GI: Soft, nontender, non-distended  MS: No edema; No deformity. Neuro:  Nonfocal  Psych: Normal affect   Labs    High Sensitivity Troponin:   Recent Labs  Lab 03/29/21 2239 03/30/21 0046 03/30/21 0343 03/30/21 0630  TROPONINIHS 122* 132* 157* 139*      Chemistry Recent Labs  Lab 03/29/21 2239 03/31/21 0428 04/01/21 0422  NA 133* 129* 127*  K 3.6 2.8* 3.0*  CL 97* 94* 93*  CO2 '26 25 25  '$ GLUCOSE 192* 110* 132*  BUN 16 23 29*  CREATININE 2.75* 3.66* 4.25*  CALCIUM 8.5* 8.3* 8.2*  PROT 6.0*  --  5.4*  ALBUMIN 2.7*  --  2.5*  AST 17  --  14*  ALT 11  --  11  ALKPHOS 72  --  63  BILITOT 0.5  --  0.2*  GFRNONAA 17* 12* 10*  ANIONGAP 10 10 9  Hematology Recent Labs  Lab 03/29/21 2239 03/31/21 0428 04/01/21 0422  WBC 8.6 9.8 7.4  RBC 2.59* 2.40* 2.37*  HGB 7.8* 7.1* 7.0*  HCT 24.4* 22.4* 21.9*  MCV 94.2 93.3 92.4  MCH 30.1 29.6 29.5  MCHC 32.0 31.7 32.0  RDW 16.2* 15.9* 15.8*  PLT 372 324 310    BNPNo results for input(s): BNP, PROBNP in the last 168 hours.   DDimer No results for input(s): DDIMER in the last 168 hours.   Radiology    ECHOCARDIOGRAM COMPLETE  Result Date: 03/31/2021    ECHOCARDIOGRAM REPORT   Patient Name:   Anna Carr Date of Exam: 03/31/2021 Medical Rec #:  [id number]         Height:       61.5 in Accession #:    [id number]        Weight:       160.1 lb Date of Birth:  07-22-1941         BSA:          1.729 m Patient Age:    48 years          BP:           126/62 mmHg Patient Gender: F                 HR:           81 bpm. Exam Location:  Inpatient Procedure: 2D  Echo, Cardiac Doppler and Color Doppler Indications:    Chest pain  History:        Patient has prior history of Echocardiogram examinations, most                 recent 11/14/2019. CHF, Mitral Valve Disease; Risk                 Factors:Hypertension, Dyslipidemia and Diabetes. ESRD.  Sonographer:    Clayton Lefort RDCS (AE) Referring Phys: [id number] [place]  1. Left ventricular ejection fraction, by estimation, is 55 to 60%. The left ventricle has normal function. The left ventricle has no regional wall motion abnormalities. There is moderate concentric left ventricular hypertrophy. Left ventricular diastolic parameters are consistent with Grade III diastolic dysfunction (restrictive).  2. Right ventricular systolic function is normal. The right ventricular size is normal. There is mildly elevated pulmonary artery systolic pressure.  3. Left atrial size was severely dilated.  4. The mitral valve is abnormal. Mild to moderate mitral valve regurgitation. Mild mitral stenosis. Severe mitral annular calcification.  5. The aortic valve is tricuspid. There is moderate calcification of the aortic valve. There is moderate thickening of the aortic valve. Aortic valve regurgitation is not visualized. Mild to moderate aortic valve sclerosis/calcification is present, without any evidence of aortic stenosis.  6. The inferior vena cava is normal in size with greater than 50% respiratory variability, suggesting right atrial pressure of 3 mmHg. Comparison(s): Prior images unable to be directly viewed, comparison made by report only. No significant change from prior study. FINDINGS  Left Ventricle: Left ventricular ejection fraction, by estimation, is 55 to 60%. The left ventricle has normal function. The left ventricle has no regional wall motion abnormalities. The left ventricular internal cavity size was normal in size. There is  moderate concentric left ventricular hypertrophy. Left ventricular diastolic  parameters are consistent with Grade III diastolic dysfunction (restrictive). Right Ventricle: The right ventricular size is normal. Right vetricular wall thickness was not well visualized. Right ventricular systolic function  is normal. There is mildly elevated pulmonary artery systolic pressure. The tricuspid regurgitant velocity  is 2.93 m/s, and with an assumed right atrial pressure of 3 mmHg, the estimated right ventricular systolic pressure is 123XX123 mmHg. Left Atrium: Left atrial size was severely dilated. Right Atrium: Right atrial size was not well visualized. Pericardium: There is no evidence of pericardial effusion. Mitral Valve: The mitral valve is abnormal. There is moderate thickening of the mitral valve leaflet(s). There is moderate calcification of the mitral valve leaflet(s). Severe mitral annular calcification. Mild to moderate mitral valve regurgitation. Mild mitral valve stenosis. MV peak gradient, 14.0 mmHg. The mean mitral valve gradient is 5.0 mmHg with average heart rate of 75 bpm. Tricuspid Valve: The tricuspid valve is grossly normal. Tricuspid valve regurgitation is mild. Aortic Valve: The aortic valve is tricuspid. There is moderate calcification of the aortic valve. There is moderate thickening of the aortic valve. Aortic valve regurgitation is not visualized. Mild to moderate aortic valve sclerosis/calcification is present, without any evidence of aortic stenosis. Aortic valve mean gradient measures 7.0 mmHg. Aortic valve peak gradient measures 11.3 mmHg. Aortic valve area, by VTI measures 1.30 cm. Pulmonic Valve: The pulmonic valve was not well visualized. Pulmonic valve regurgitation is not visualized. No evidence of pulmonic stenosis. Aorta: The aortic root and ascending aorta are structurally normal, with no evidence of dilitation. Venous: The inferior vena cava is normal in size with greater than 50% respiratory variability, suggesting right atrial pressure of 3 mmHg. IAS/Shunts: The  interatrial septum was not well visualized.  LEFT VENTRICLE PLAX 2D LVIDd:         5.00 cm  Diastology LVIDs:         3.30 cm  LV e' medial:    4.68 cm/s LV PW:         1.30 cm  LV E/e' medial:  36.5 LV IVS:        1.50 cm  LV e' lateral:   10.40 cm/s LVOT diam:     1.90 cm  LV E/e' lateral: 16.4 LV SV:         59 LV SV Index:   34 LVOT Area:     2.84 cm  RIGHT VENTRICLE             IVC RV Basal diam:  3.30 cm     IVC diam: 1.80 cm RV S prime:     10.20 cm/s TAPSE (M-mode): 2.1 cm LEFT ATRIUM            Index       RIGHT ATRIUM           Index LA diam:      4.80 cm  2.78 cm/m  RA Area:     14.10 cm LA Vol (A4C): 108.0 ml 62.47 ml/m RA Volume:   36.50 ml  21.11 ml/m  AORTIC VALVE AV Area (Vmax):    1.31 cm AV Area (Vmean):   1.38 cm AV Area (VTI):     1.30 cm AV Vmax:           168.00 cm/s AV Vmean:          124.000 cm/s AV VTI:            0.453 m AV Peak Grad:      11.3 mmHg AV Mean Grad:      7.0 mmHg LVOT Vmax:         77.70 cm/s LVOT Vmean:  60.500 cm/s LVOT VTI:          0.208 m LVOT/AV VTI ratio: 0.46  AORTA Ao Root diam: 2.90 cm Ao Asc diam:  3.00 cm MITRAL VALVE                 TRICUSPID VALVE MV Area (PHT): 3.53 cm      TR Peak grad:   34.3 mmHg MV Area VTI:   1.39 cm      TR Vmax:        293.00 cm/s MV Peak grad:  14.0 mmHg MV Mean grad:  5.0 mmHg      SHUNTS MV Vmax:       1.87 m/s      Systemic VTI:  0.21 m MV Vmean:      98.8 cm/s     Systemic Diam: 1.90 cm MV Decel Time: 215 msec MR Peak grad:    109.8 mmHg MR Mean grad:    72.0 mmHg MR Vmax:         524.00 cm/s MR Vmean:        404.0 cm/s MR PISA:         1.57 cm MR PISA Eff ROA: 12 mm MR PISA Radius:  0.50 cm MV E velocity: 171.00 cm/s MV A velocity: 72.80 cm/s MV E/A ratio:  2.35 Buford Dresser MD Electronically signed by Buford Dresser MD Signature Date/Time: 03/31/2021/1:42:49 PM    Final     Cardiac Studies      Patient Profile     80 y.o. female with chronic diastolic chf   Assessment & Plan    1.   Elevated Troponins:   Likely demand ischemia Her LV function is unchanged from previous echo Has evidence of recent GI bleed  I agree that she is a very poor candidate for invasive cardiac procedures  2.  Anemia :   Getting transfused in HD today .  Further plans per primary team   3.  Hypokalemia :  Plans per nephrology   4. Hyponatremia:  Further management per nephrology    We will sign off. Call for questions    [facility] will sign off.   Medication Recommendations:   Other recommendations (labs, testing, etc):   Follow up as an outpatient:  With Dr. Harriet Masson ( or other partner in [place])   For questions or updates, please contact [facility] Please consult [email or web address] for contact info under        Signed, Mertie Moores, MD  04/01/2021, 11:26 AM

## 2021-04-01 NOTE — Progress Notes (Signed)
[facility] NEPHROLOGY PROGRESS NOTE  Assessment/ Plan: Pt is a 80 y.o. yo female with HTN, DM, CHF, COPD, HLD, ESRD on HD TTS at [facility] presented with nausea and diarrhea due to C. difficile colitis seen as a consultation to manage ESRD. HD orders: [facility] T,Th,S 3.5 hrs 180NRe 400/500 73 kg 3.0K/2.5Ca UFP 2 via TDC -No Heparin -Mircera 100 mcg IV q 2 weeks (last dose 03/29/2021 Last HGB 06/02 7.1  #C. difficile colitis: Currently on Dificid per primary team.  The diarrhea has improved per patient.  # ESRD TTS schedule: Plan for dialysis today.  On 4K bath , UF as tolerated.  #Hyponatremia and hypokalemia probably because of GI loss.  Managing with dialysis.  # Anemia of CKD: Hemoglobin low therefore plan to transfuse a unit of blood at dialysis.  Recently received ESA.  Monitor hemoglobin.  # Secondary hyperparathyroidism: Continue Auryxia, monitor lab.  # HTN/volume: Blood pressure and volume status acceptable.  HD today.  Discussed with the primary team.  Okay to discharge from renal perspective.  Subjective: Seen and examined at bedside.  Patient reported that her diarrhea has improved and now having some constipation.  Denies fever, chills, nausea, vomiting, abdominal pain.  No chest pain or shortness of breath.  Plan to receive dialysis today.  Objective Vital signs in last 24 hours: Vitals:   04/01/21 0905 04/01/21 0910 04/01/21 0930 04/01/21 0958  BP: (!) 140/52 (!) 110/50 (!) (P) 127/56 (!) 125/53  Pulse: 79 75 (P) 75 78  Resp: 18 19  (!) 25  Temp:    98.5 F (36.9 C)  TempSrc:    Oral  SpO2:      Weight:      Height:       Weight change:  No intake or output data in the 24 hours ending 04/01/21 1010     Labs: Basic Metabolic Panel: Recent Labs  Lab 03/29/21 2239 03/31/21 0428 04/01/21 0422  NA 133* 129* 127*  K 3.6 2.8* 3.0*  CL 97* 94* 93*  CO2 '26 25 25  '$ GLUCOSE 192* 110* 132*  BUN 16 23 29*  CREATININE 2.75* 3.66* 4.25*   CALCIUM 8.5* 8.3* 8.2*   Liver Function Tests: Recent Labs  Lab 03/29/21 2239 04/01/21 0422  AST 17 14*  ALT 11 11  ALKPHOS 72 63  BILITOT 0.5 0.2*  PROT 6.0* 5.4*  ALBUMIN 2.7* 2.5*   No results for input(s): LIPASE, AMYLASE in the last 168 hours. No results for input(s): AMMONIA in the last 168 hours. CBC: Recent Labs  Lab 03/29/21 2239 03/31/21 0428 04/01/21 0422  WBC 8.6 9.8 7.4  NEUTROABS 6.2  --   --   HGB 7.8* 7.1* 7.0*  HCT 24.4* 22.4* 21.9*  MCV 94.2 93.3 92.4  PLT 372 324 310   Cardiac Enzymes: No results for input(s): CKTOTAL, CKMB, CKMBINDEX, TROPONINI in the last 168 hours. CBG: Recent Labs  Lab 03/31/21 1138 03/31/21 1425 03/31/21 1618 03/31/21 2021 04/01/21 0735  GLUCAP 215* 154* 147* 204* 108*    Iron Studies: No results for input(s): IRON, TIBC, TRANSFERRIN, FERRITIN in the last 72 hours. Studies/Results: ECHOCARDIOGRAM COMPLETE  Result Date: 03/31/2021    ECHOCARDIOGRAM REPORT   Patient Name:   Anna Carr Date of Exam: 03/31/2021 Medical Rec #:  [id number]         Height:       61.5 in Accession #:    [id number]  Weight:       160.1 lb Date of Birth:  [birth year]         BSA:          1.729 m Patient Age:    80 years          BP:           126/62 mmHg Patient Gender: F                 HR:           81 bpm. Exam Location:  Inpatient Procedure: 2D Echo, Cardiac Doppler and Color Doppler Indications:    Chest pain  History:        Patient has prior history of Echocardiogram examinations, most                 recent 11/14/2019. CHF, Mitral Valve Disease; Risk                 Factors:Hypertension, Dyslipidemia and Diabetes. ESRD.  Sonographer:    Clayton Lefort RDCS (AE) Referring Phys: [id number] [place]  1. Left ventricular ejection fraction, by estimation, is 55 to 60%. The left ventricle has normal function. The left ventricle has no regional wall motion abnormalities. There is moderate concentric left ventricular hypertrophy.  Left ventricular diastolic parameters are consistent with Grade III diastolic dysfunction (restrictive).  2. Right ventricular systolic function is normal. The right ventricular size is normal. There is mildly elevated pulmonary artery systolic pressure.  3. Left atrial size was severely dilated.  4. The mitral valve is abnormal. Mild to moderate mitral valve regurgitation. Mild mitral stenosis. Severe mitral annular calcification.  5. The aortic valve is tricuspid. There is moderate calcification of the aortic valve. There is moderate thickening of the aortic valve. Aortic valve regurgitation is not visualized. Mild to moderate aortic valve sclerosis/calcification is present, without any evidence of aortic stenosis.  6. The inferior vena cava is normal in size with greater than 50% respiratory variability, suggesting right atrial pressure of 3 mmHg. Comparison(s): Prior images unable to be directly viewed, comparison made by report only. No significant change from prior study. FINDINGS  Left Ventricle: Left ventricular ejection fraction, by estimation, is 55 to 60%. The left ventricle has normal function. The left ventricle has no regional wall motion abnormalities. The left ventricular internal cavity size was normal in size. There is  moderate concentric left ventricular hypertrophy. Left ventricular diastolic parameters are consistent with Grade III diastolic dysfunction (restrictive). Right Ventricle: The right ventricular size is normal. Right vetricular wall thickness was not well visualized. Right ventricular systolic function is normal. There is mildly elevated pulmonary artery systolic pressure. The tricuspid regurgitant velocity  is 2.93 m/s, and with an assumed right atrial pressure of 3 mmHg, the estimated right ventricular systolic pressure is 123XX123 mmHg. Left Atrium: Left atrial size was severely dilated. Right Atrium: Right atrial size was not well visualized. Pericardium: There is no evidence of  pericardial effusion. Mitral Valve: The mitral valve is abnormal. There is moderate thickening of the mitral valve leaflet(s). There is moderate calcification of the mitral valve leaflet(s). Severe mitral annular calcification. Mild to moderate mitral valve regurgitation. Mild mitral valve stenosis. MV peak gradient, 14.0 mmHg. The mean mitral valve gradient is 5.0 mmHg with average heart rate of 75 bpm. Tricuspid Valve: The tricuspid valve is grossly normal. Tricuspid valve regurgitation is mild. Aortic Valve: The aortic valve is tricuspid. There is moderate calcification of the aortic  valve. There is moderate thickening of the aortic valve. Aortic valve regurgitation is not visualized. Mild to moderate aortic valve sclerosis/calcification is present, without any evidence of aortic stenosis. Aortic valve mean gradient measures 7.0 mmHg. Aortic valve peak gradient measures 11.3 mmHg. Aortic valve area, by VTI measures 1.30 cm. Pulmonic Valve: The pulmonic valve was not well visualized. Pulmonic valve regurgitation is not visualized. No evidence of pulmonic stenosis. Aorta: The aortic root and ascending aorta are structurally normal, with no evidence of dilitation. Venous: The inferior vena cava is normal in size with greater than 50% respiratory variability, suggesting right atrial pressure of 3 mmHg. IAS/Shunts: The interatrial septum was not well visualized.  LEFT VENTRICLE PLAX 2D LVIDd:         5.00 cm  Diastology LVIDs:         3.30 cm  LV e' medial:    4.68 cm/s LV PW:         1.30 cm  LV E/e' medial:  36.5 LV IVS:        1.50 cm  LV e' lateral:   10.40 cm/s LVOT diam:     1.90 cm  LV E/e' lateral: 16.4 LV SV:         59 LV SV Index:   34 LVOT Area:     2.84 cm  RIGHT VENTRICLE             IVC RV Basal diam:  3.30 cm     IVC diam: 1.80 cm RV S prime:     10.20 cm/s TAPSE (M-mode): 2.1 cm LEFT ATRIUM            Index       RIGHT ATRIUM           Index LA diam:      4.80 cm  2.78 cm/m  RA Area:     14.10 cm  LA Vol (A4C): 108.0 ml 62.47 ml/m RA Volume:   36.50 ml  21.11 ml/m  AORTIC VALVE AV Area (Vmax):    1.31 cm AV Area (Vmean):   1.38 cm AV Area (VTI):     1.30 cm AV Vmax:           168.00 cm/s AV Vmean:          124.000 cm/s AV VTI:            0.453 m AV Peak Grad:      11.3 mmHg AV Mean Grad:      7.0 mmHg LVOT Vmax:         77.70 cm/s LVOT Vmean:        60.500 cm/s LVOT VTI:          0.208 m LVOT/AV VTI ratio: 0.46  AORTA Ao Root diam: 2.90 cm Ao Asc diam:  3.00 cm MITRAL VALVE                 TRICUSPID VALVE MV Area (PHT): 3.53 cm      TR Peak grad:   34.3 mmHg MV Area VTI:   1.39 cm      TR Vmax:        293.00 cm/s MV Peak grad:  14.0 mmHg MV Mean grad:  5.0 mmHg      SHUNTS MV Vmax:       1.87 m/s      Systemic VTI:  0.21 m MV Vmean:      98.8 cm/s     Systemic Diam: 1.90 cm  MV Decel Time: 215 msec MR Peak grad:    109.8 mmHg MR Mean grad:    72.0 mmHg MR Vmax:         524.00 cm/s MR Vmean:        404.0 cm/s MR PISA:         1.57 cm MR PISA Eff ROA: 12 mm MR PISA Radius:  0.50 cm MV E velocity: 171.00 cm/s MV A velocity: 72.80 cm/s MV E/A ratio:  2.35 Buford Dresser MD Electronically signed by Buford Dresser MD Signature Date/Time: 03/31/2021/1:42:49 PM    Final     Medications: Infusions: . sodium chloride    . sodium chloride      Scheduled Medications: . sodium chloride   Intravenous Once  . acidophilus  1 capsule Oral Daily  . aspirin EC  81 mg Oral q AM  . carvedilol  6.25 mg Oral BID  . Chlorhexidine Gluconate Cloth  6 each Topical Daily  . feeding supplement  237 mL Oral Q24H  . ferric citrate  210 mg Oral TID WC  . fidaxomicin  200 mg Oral BID  . insulin aspart  0-6 Units Subcutaneous TID WC  . insulin glargine  12 Units Subcutaneous q AM  . multivitamin  1 tablet Oral QHS  . pantoprazole  40 mg Oral Daily  . sodium chloride flush  3 mL Intravenous Q12H    have reviewed scheduled and prn medications.  Physical Exam: General:NAD, comfortable Heart:RRR,  s1s2 nl Lungs:clear b/l, no crackle Abdomen:soft, Non-tender, non-distended Extremities:No edema Dialysis Access: TDC.  Ladelle Teodoro Prasad Khilynn Borntreger 04/01/2021,10:10 AM  LOS: 2 days

## 2021-04-02 ENCOUNTER — Telehealth: Payer: Self-pay | Admitting: Nurse Practitioner

## 2021-04-02 ENCOUNTER — Other Ambulatory Visit (HOSPITAL_COMMUNITY): Payer: Self-pay

## 2021-04-02 LAB — BPAM RBC
Blood Product Expiration Date: 202206282359
ISSUE DATE / TIME: 202206070951
Unit Type and Rh: 6200

## 2021-04-02 LAB — TYPE AND SCREEN
ABO/RH(D): A POS
Antibody Screen: NEGATIVE
Unit division: 0

## 2021-04-02 NOTE — Telephone Encounter (Signed)
Transition of care contact from inpatient facility  Date of discharge: 04/01/2021 Date of contact: 04/02/2021 Method: Phone Spoke to: Patient  Patient contacted to discuss transition of care from recent inpatient hospitalization. Patient was admitted to [facility] from 06/04-06/07/2022with discharge diagnosis of C. Difficile Colitis  Medication changes were reviewed.  Patient will follow up with his/her outpatient HD unit on: 04/03/2021.

## 2021-04-04 ENCOUNTER — Ambulatory Visit (INDEPENDENT_AMBULATORY_CARE_PROVIDER_SITE_OTHER): Payer: Medicare Other | Admitting: Physician Assistant

## 2021-04-04 ENCOUNTER — Other Ambulatory Visit: Payer: Self-pay

## 2021-04-04 VITALS — BP 141/62 | HR 71 | Temp 97.7°F | Resp 20 | Ht 61.5 in | Wt 157.4 lb

## 2021-04-04 DIAGNOSIS — N186 End stage renal disease: Secondary | ICD-10-CM

## 2021-04-04 DIAGNOSIS — Z992 Dependence on renal dialysis: Secondary | ICD-10-CM

## 2021-04-04 NOTE — Progress Notes (Signed)
    Postoperative Access Visit   History of Present Illness   Anna Carr is a 80 y.o. year old female who presents for postoperative follow-up for:  Revision of left arm AV fistula with placement of interposition 4-7 mm AV graft by Dr. Donzetta Matters on 03/12/21. The patient's wounds are well healed.  The patient notes no steal symptoms.  The patient is able to complete their activities of daily living.    She dialyzes at the [facility] on TTS via [facility]  Physical Examination   Vitals:   04/04/21 1514  BP: (!) 141/62  Pulse: 71  Resp: 20  Temp: 97.7 F (36.5 C)  TempSrc: Temporal  SpO2: 100%  Weight: 157 lb 6.4 oz (71.4 kg)  Height: 5' 1.5" (1.562 m)   Body mass index is 29.26 kg/m.  left arm Incision is well healed, 2+ radial pulse, hand grip is 5/5, sensation in digits is intact, palpable thrill, bruit can  be auscultated     Medical Decision Making   Anna Carr is a 80 y.o. year old female who presents s/p Revision of left arm AV fistula with placement of interposition 4-7 mm AV graft by Dr. Donzetta Matters on 03/12/21. Her incisions are well healed. AV graft clinically is functioning well.  Patent is without signs or symptoms of steal syndrome The patient's access will be ready for use 04/12/21 The patient's tunneled dialysis catheter can be removed when Nephrology is comfortable with the performance of the left AV graft The patient may follow up on a prn basis   Karoline Caldwell, PA-C [facility]: [phone]  On call MD: Dr. Stanford Breed

## 2021-05-29 IMAGING — DX DG CHEST 1V
1 series · 1 of 1 positions shown · non-contrast
Comparison: March 22, 2021

CLINICAL DATA: Pneumonia.

EXAM:
CHEST  1 VIEW

[chest]
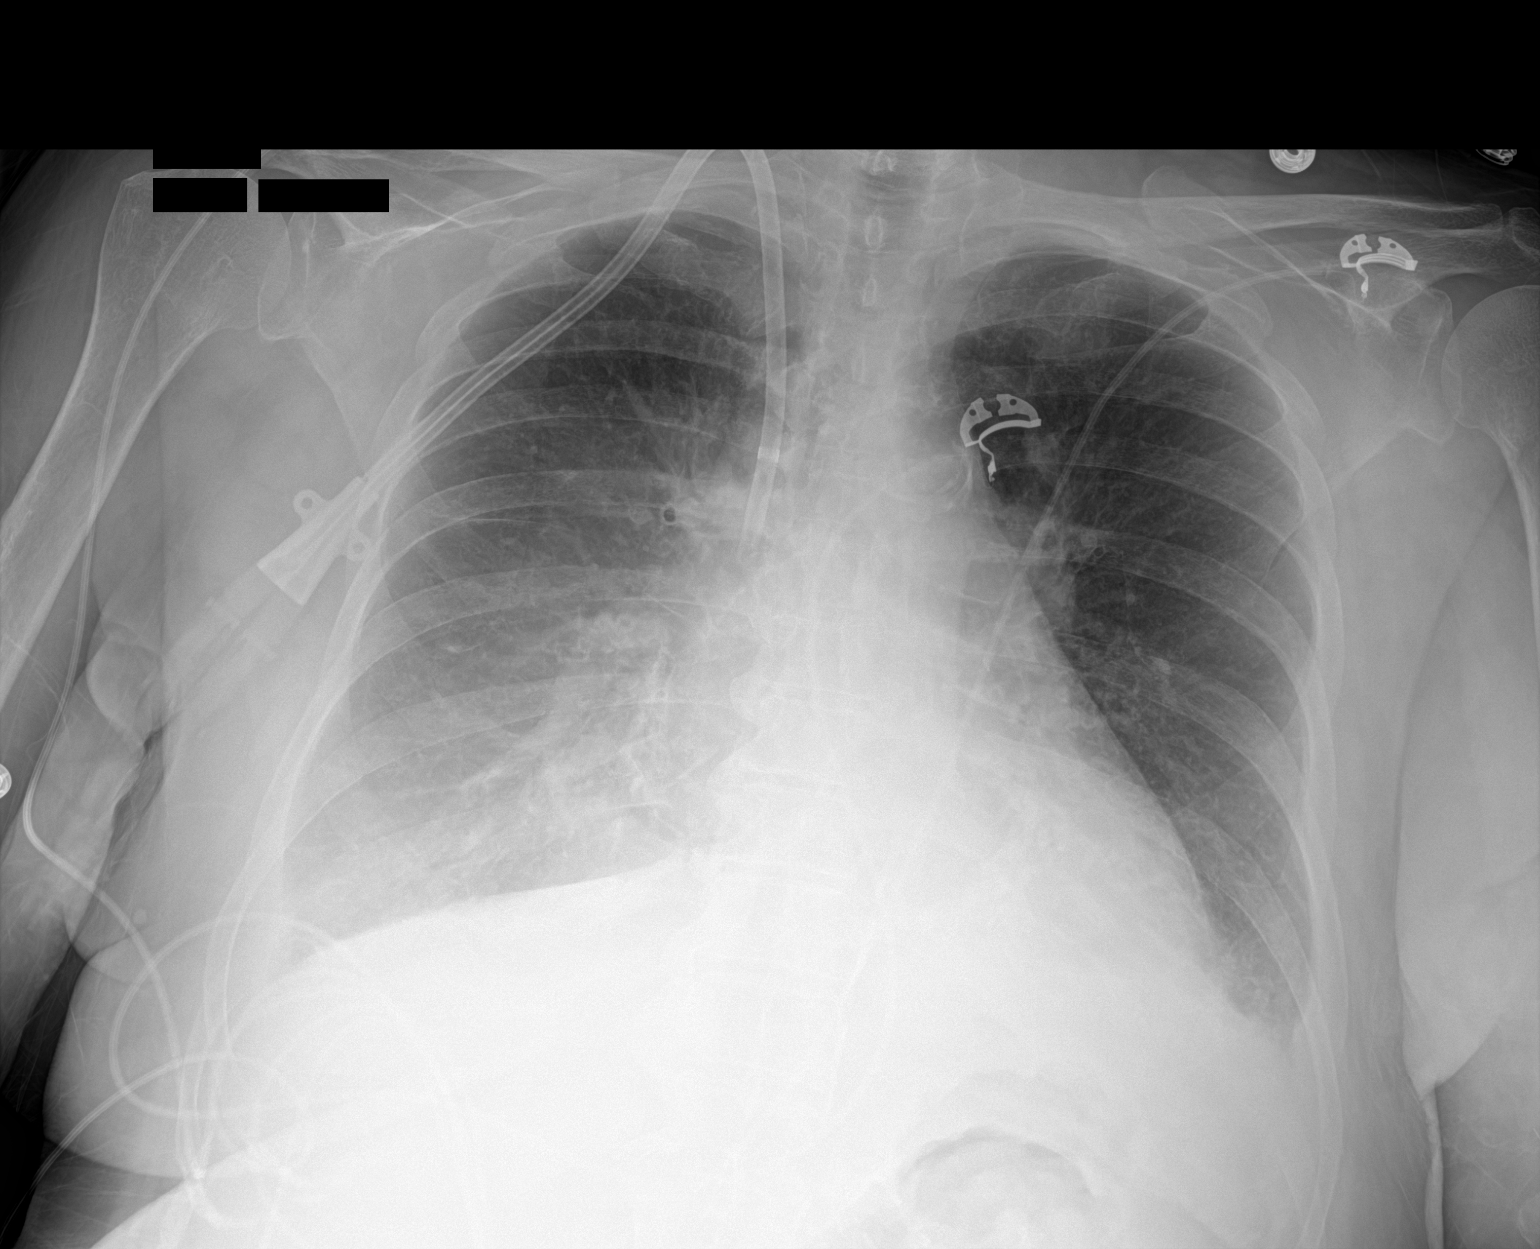

[1 of 1 positions shown; findings below may reference images not displayed]

FINDINGS: Dual lumen right central venous catheter in stable position.

Cardiomediastinal silhouette is normal. Mediastinal contours appear
intact.

Bilateral pleural effusions, small. No definite focal airspace
consolidation is seen radiographically.

Osseous structures are without acute abnormality. Soft tissues are
grossly normal.
IMPRESSION: Bilateral pleural effusions, small.

## 2021-06-05 IMAGING — CR DG CHEST 2V
2 series · 2 of 2 positions shown · non-contrast
Comparison: Chest x-rays dated 03/22/2021 and 03/14/2021.

CLINICAL DATA: Anemia, chest pain, weakness.

EXAM:
CHEST - 2 VIEW

[chest pa]
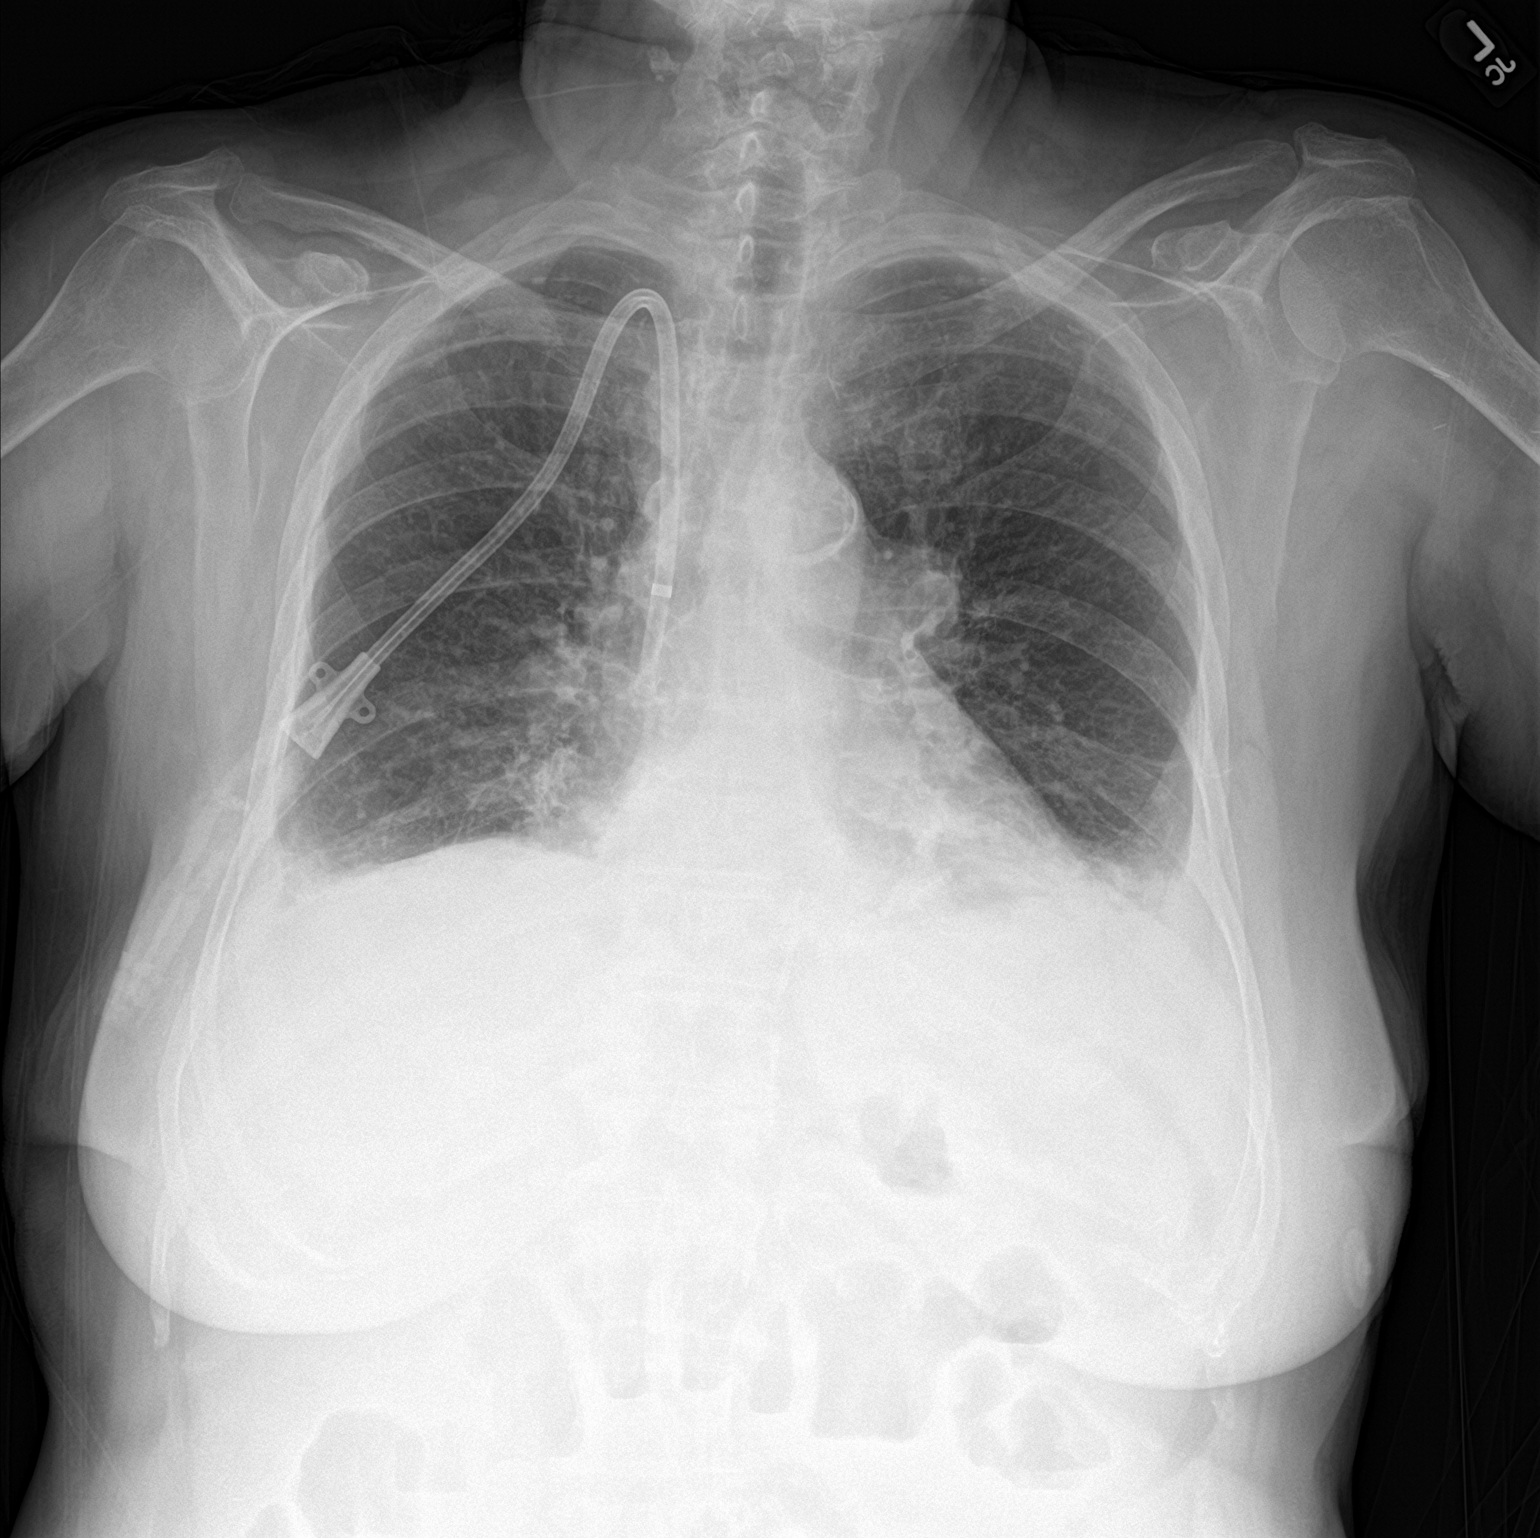

[chest lat]
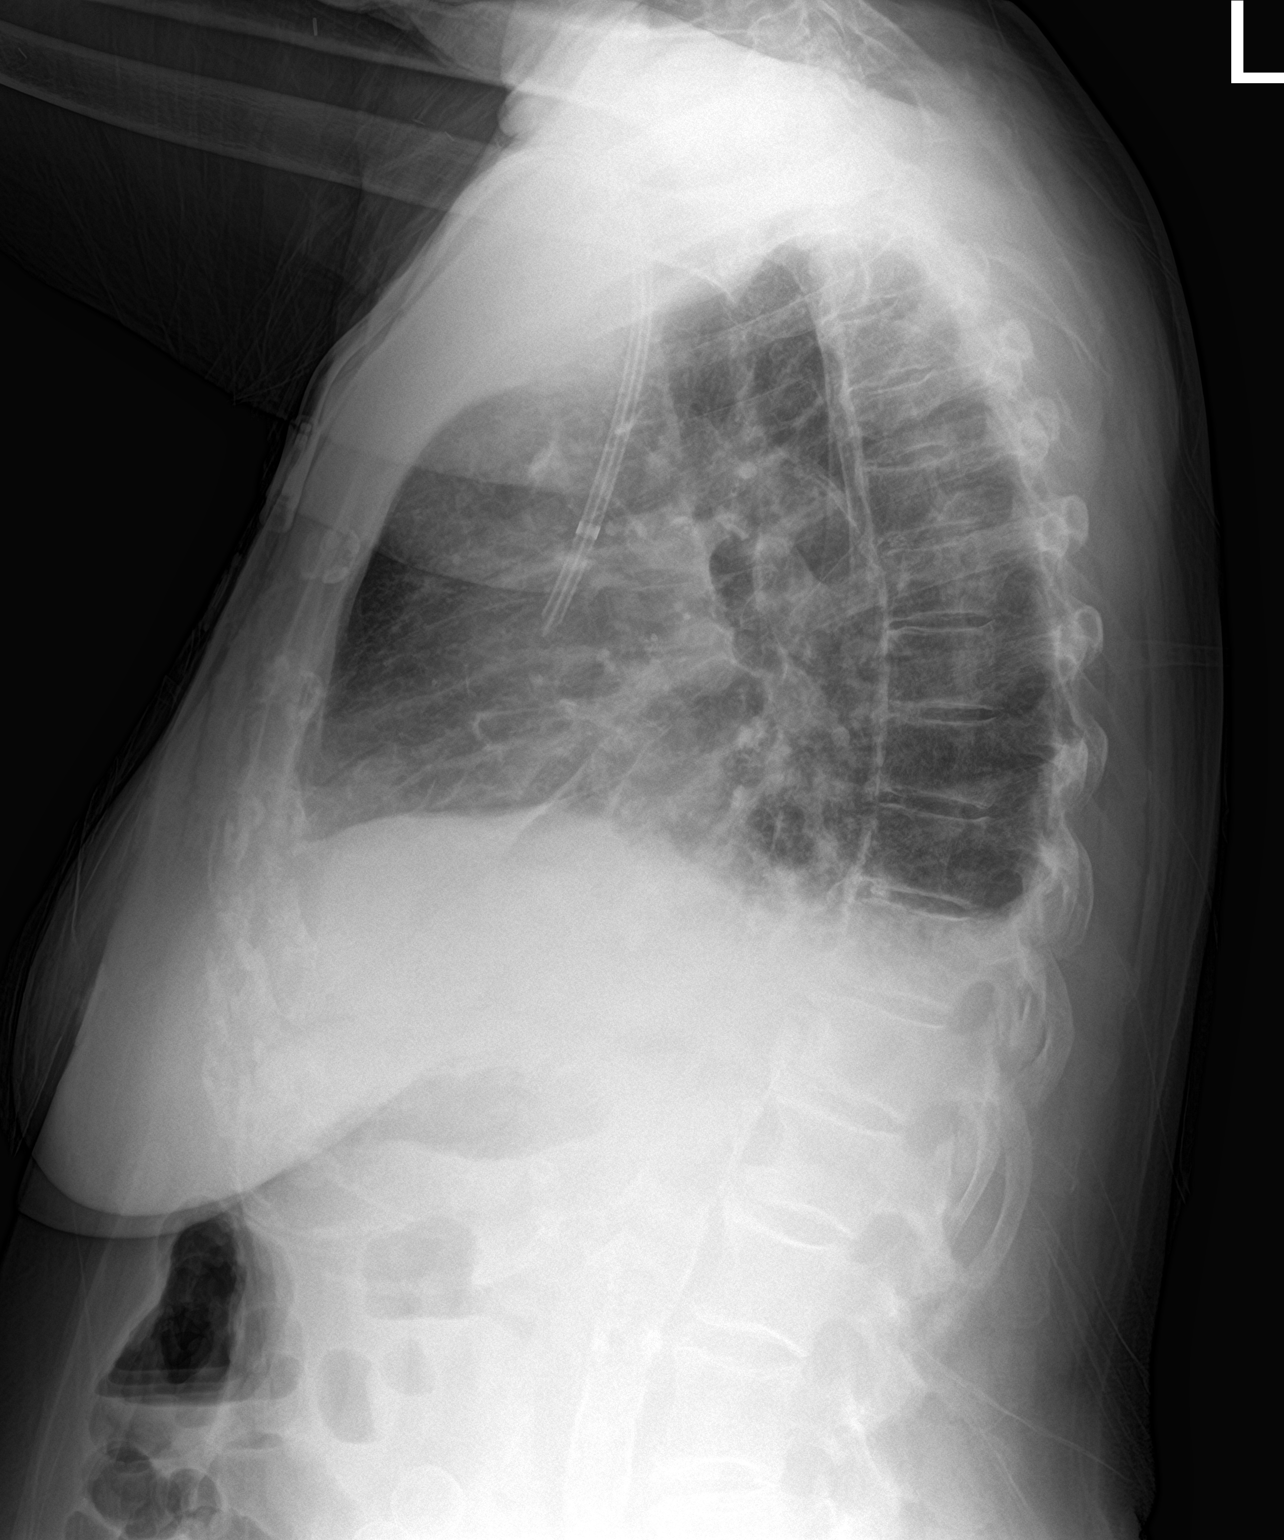

[2 of 2 positions shown; findings below may reference images not displayed]

FINDINGS: Probable mild bibasilar atelectasis and/or small pleural effusions,
however, improved aeration at the RIGHT lung base compared to most
recent chest x-ray of 03/22/2021. No new lung findings. No
pneumothorax is seen. Heart size and mediastinal contours are
stable. Dialysis catheter is stable in position. Extensive aortic
atherosclerosis. No acute appearing osseous abnormality.
IMPRESSION: Probable mild bibasilar atelectasis and/or small bilateral pleural
effusions. No evidence of pneumonia or pulmonary edema.

## 2021-06-06 IMAGING — CT CT ABD-PELV W/O CM
2 of 4 series · 16 of 46 positions shown, 18 images · non-contrast
Comparison: CT a abdomen pelvis 01/01/2020, CT abdomen pelvis
03/14/2021

CLINICAL DATA: Nonlocalized acute abdominal pain.

EXAM:
CT ABDOMEN AND PELVIS WITHOUT CONTRAST
TECHNIQUE: Multidetector CT imaging of the abdomen and pelvis was performed
following the standard protocol without IV contrast.

[Series 3: a/p w/o 5mm · axial · non-contrast · 0.89mm/px · z∈[+838,+1228]mm · 13 of 86 slices shown, 15 images]
[im 4/86  soft-tissue]
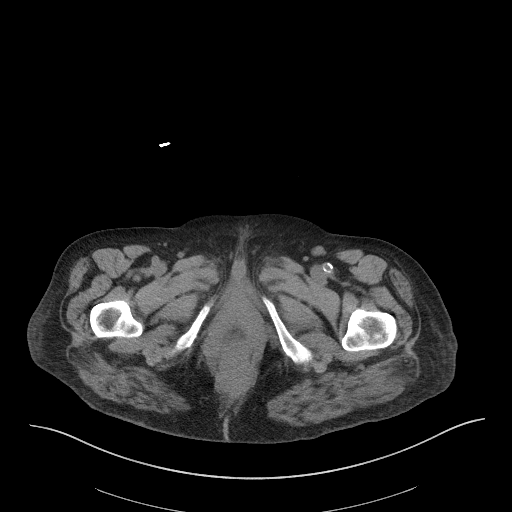
[im 4/86  bone]
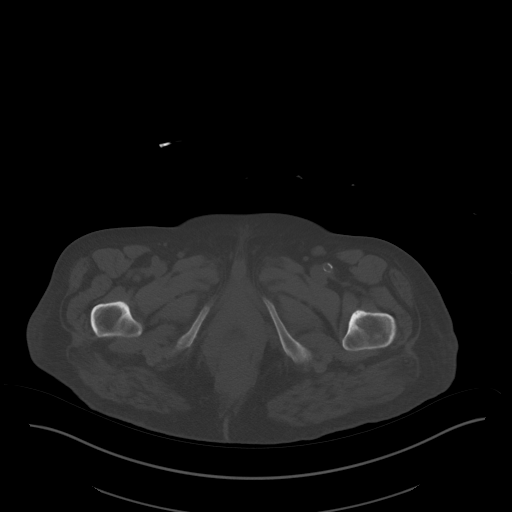
[im 10/86  soft-tissue]
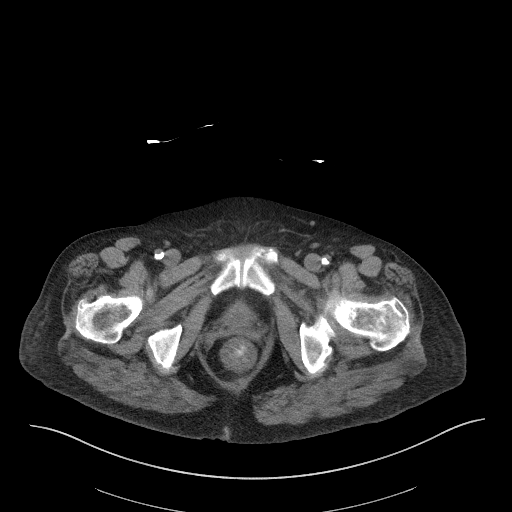
[im 17/86  soft-tissue]
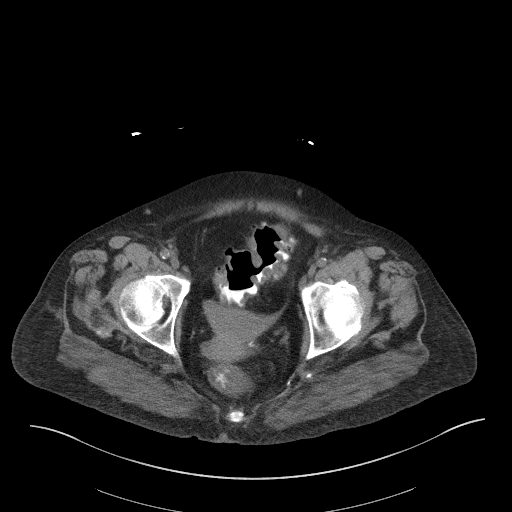
[im 23/86  soft-tissue]
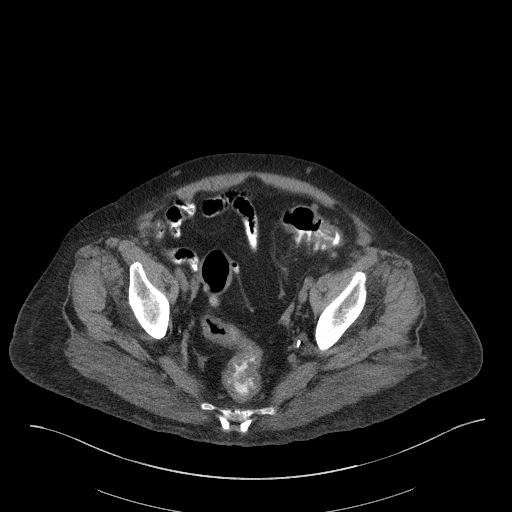
[im 30/86  soft-tissue]
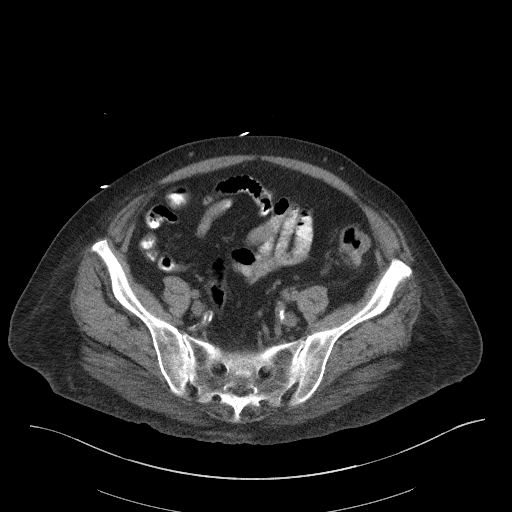
[im 36/86  soft-tissue]
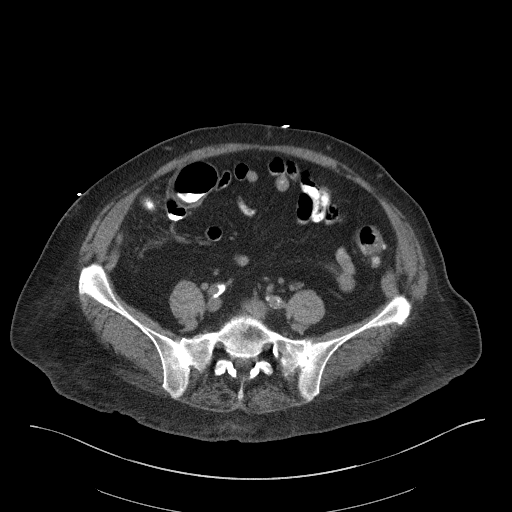
[im 43/86  soft-tissue]
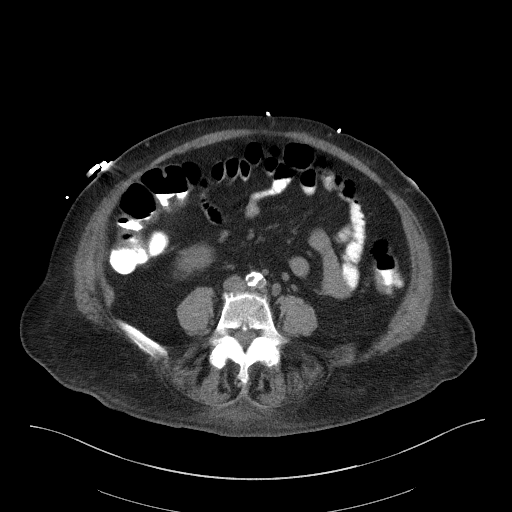
[im 50/86  soft-tissue]
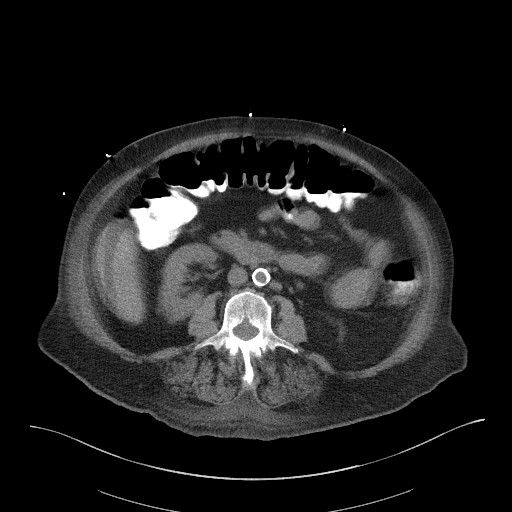
[im 56/86  soft-tissue]
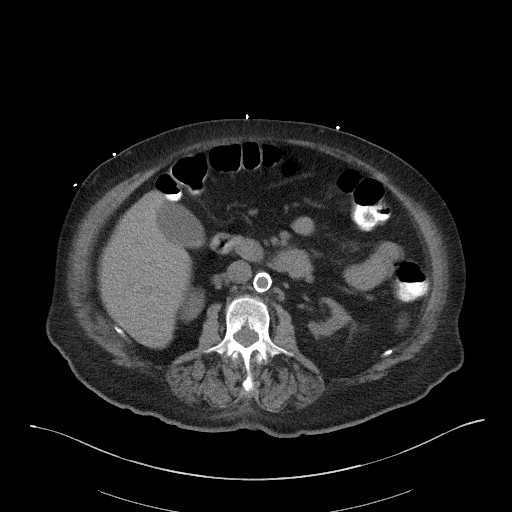
[im 56/86  bone]
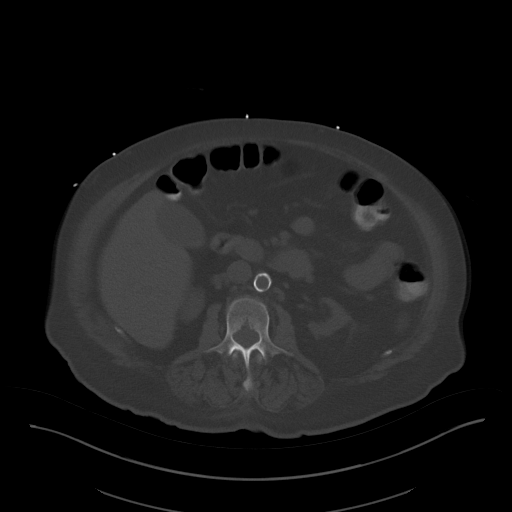
[im 63/86  soft-tissue]
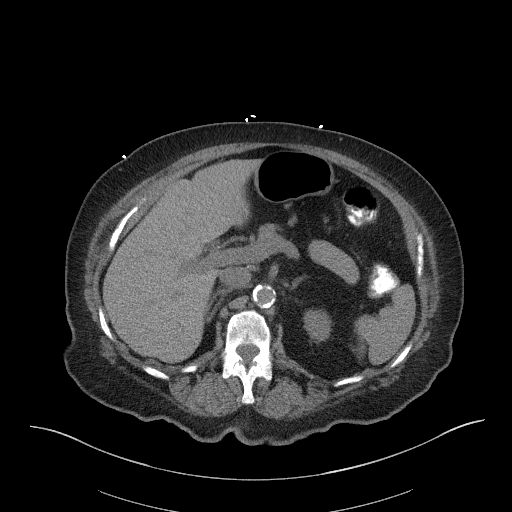
[im 69/86  soft-tissue]
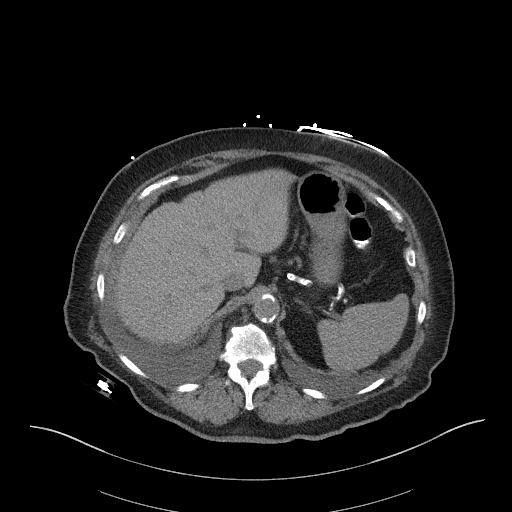
[im 76/86  soft-tissue]
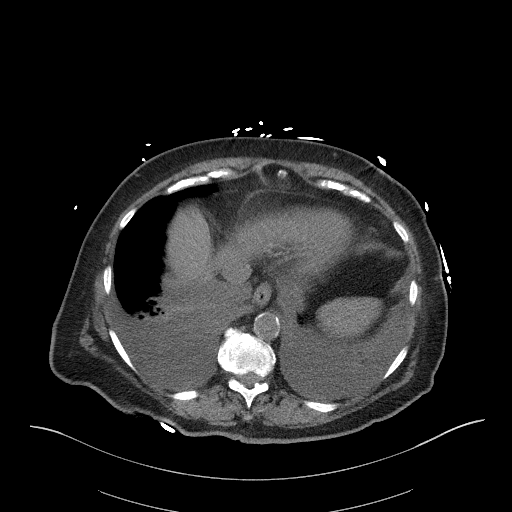
[im 82/86  soft-tissue]
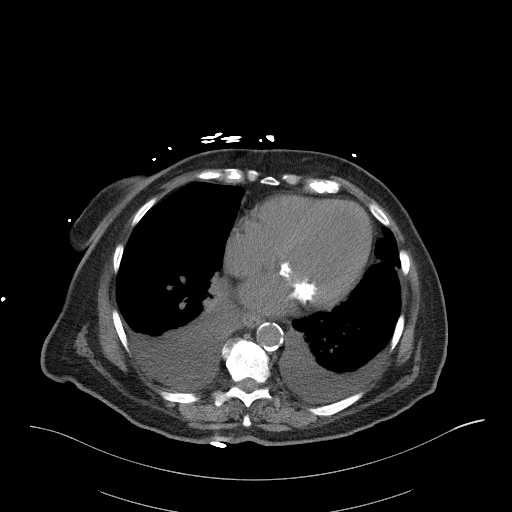

[Series 6: a/p w/o cor · coronal · non-contrast · 0.78mm/px · 3 of 159 slices shown]
[im 53/159  soft-tissue]
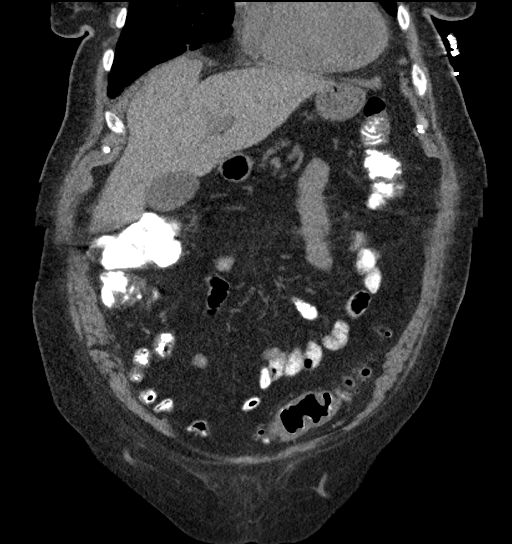
[im 71/159  soft-tissue]
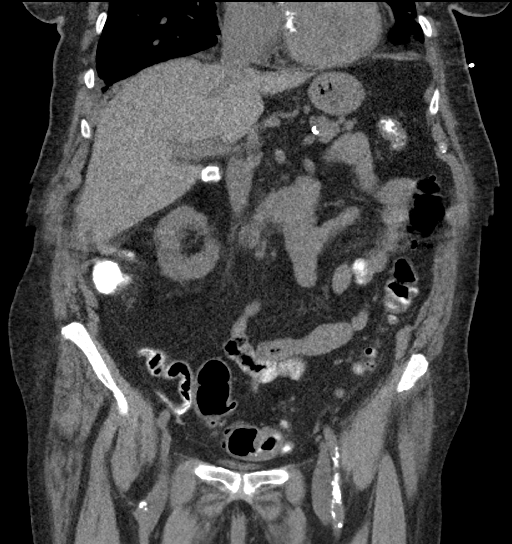
[im 88/159  soft-tissue]
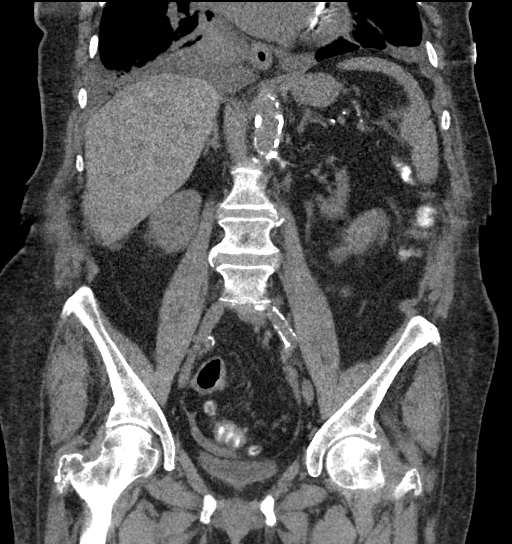

[16 of 46 positions shown; findings below may reference images not displayed]

FINDINGS: Lower chest: Interval increase in small right pleural effusion and
interval increase in trace to small left pleural effusion.
Associated passive atelectasis of the bilateral lower lobes. Mitral
annular calcifications. Findings suggestive of anemia. Thoracic
aorta calcifications severe.

Hepatobiliary: No focal liver abnormality. Calcified gallstones
within the gallbladder lumen. No gallbladder wall thickening or
pericholecystic fluid. No biliary dilatation.

Pancreas: No focal lesion. Normal pancreatic contour. No surrounding
inflammatory changes. No main pancreatic ductal dilatation.

Spleen: Normal in size without focal abnormality.

Adrenals/Urinary Tract:

No adrenal nodule bilaterally.

Atrophic left kidney. No nephrolithiasis, no hydronephrosis, and no
contour-deforming renal mass. No ureterolithiasis or hydroureter.

The urinary bladder is unremarkable.

Stomach/Bowel: Stomach is within normal limits. No evidence of bowel
wall thickening or dilatation. Diffuse sigmoid diverticulosis.
Circumferential rectosigmoid bowel wall thickening likely due to
under distension. No perisigmoid or perirectal fat stranding. No
pneumatosis. The appendix is not definitely identified.

Vascular/Lymphatic: No abdominal aorta or iliac aneurysm. Severe
atherosclerotic plaque of the aorta and its branches. No abdominal,
pelvic, or inguinal lymphadenopathy.

Reproductive: Uterus and bilateral adnexa are unremarkable.

Other: No intraperitoneal free fluid. No intraperitoneal free gas.
No organized fluid collection.

Musculoskeletal:

No abdominal wall hernia or abnormality.

No suspicious lytic or blastic osseous lesions. No acute displaced
fracture. Multilevel degenerative changes of the spine.
IMPRESSION: 1. Interval increase in size of small right and trace to small left
pleural effusions.
2. Cholelithiasis with no CT findings of acute cholecystitis or
choledocholithiasis.
3. Diffuse sigmoid diverticulosis with no acute diverticulitis.
4. Atrophic left kidney.
5. Circumferential rectosigmoid bowel wall thickening likely due to
underdistension. Limited evaluation due to noncontrast study.
6. Aortic Atherosclerosis ([id number]) - severe as well as mitral
annular calcifications.

## 2021-09-01 ENCOUNTER — Encounter (INDEPENDENT_AMBULATORY_CARE_PROVIDER_SITE_OTHER): Payer: Medicare Other | Admitting: Ophthalmology

## 2021-09-12 ENCOUNTER — Encounter (INDEPENDENT_AMBULATORY_CARE_PROVIDER_SITE_OTHER): Payer: Medicare Other | Admitting: Ophthalmology

## 2021-09-12 ENCOUNTER — Other Ambulatory Visit: Payer: Self-pay

## 2021-09-12 DIAGNOSIS — E113513 Type 2 diabetes mellitus with proliferative diabetic retinopathy with macular edema, bilateral: Secondary | ICD-10-CM | POA: Diagnosis not present

## 2021-09-12 DIAGNOSIS — H35033 Hypertensive retinopathy, bilateral: Secondary | ICD-10-CM | POA: Diagnosis not present

## 2021-09-12 DIAGNOSIS — H43813 Vitreous degeneration, bilateral: Secondary | ICD-10-CM

## 2021-09-12 DIAGNOSIS — I10 Essential (primary) hypertension: Secondary | ICD-10-CM

## 2021-10-10 ENCOUNTER — Encounter (INDEPENDENT_AMBULATORY_CARE_PROVIDER_SITE_OTHER): Payer: Medicare Other | Admitting: Ophthalmology

## 2021-10-10 ENCOUNTER — Other Ambulatory Visit: Payer: Self-pay

## 2021-10-10 DIAGNOSIS — E113513 Type 2 diabetes mellitus with proliferative diabetic retinopathy with macular edema, bilateral: Secondary | ICD-10-CM

## 2021-10-10 DIAGNOSIS — H35033 Hypertensive retinopathy, bilateral: Secondary | ICD-10-CM | POA: Diagnosis not present

## 2021-10-10 DIAGNOSIS — H43813 Vitreous degeneration, bilateral: Secondary | ICD-10-CM

## 2021-10-10 DIAGNOSIS — H35372 Puckering of macula, left eye: Secondary | ICD-10-CM

## 2021-10-10 DIAGNOSIS — I10 Essential (primary) hypertension: Secondary | ICD-10-CM | POA: Diagnosis not present

## 2021-10-25 DIAGNOSIS — I340 Nonrheumatic mitral (valve) insufficiency: Secondary | ICD-10-CM

## 2021-10-25 DIAGNOSIS — K921 Melena: Secondary | ICD-10-CM

## 2021-10-25 DIAGNOSIS — I361 Nonrheumatic tricuspid (valve) insufficiency: Secondary | ICD-10-CM

## 2021-10-25 DIAGNOSIS — R778 Other specified abnormalities of plasma proteins: Secondary | ICD-10-CM

## 2021-10-25 DIAGNOSIS — N186 End stage renal disease: Secondary | ICD-10-CM

## 2021-10-27 DIAGNOSIS — E861 Hypovolemia: Secondary | ICD-10-CM

## 2021-10-27 HISTORY — DX: Hypovolemia: E86.1

## 2021-10-30 DIAGNOSIS — K31811 Angiodysplasia of stomach and duodenum with bleeding: Secondary | ICD-10-CM | POA: Insufficient documentation

## 2021-10-30 HISTORY — DX: Angiodysplasia of stomach and duodenum with bleeding: K31.811

## 2021-10-31 ENCOUNTER — Encounter: Payer: Self-pay | Admitting: Cardiology

## 2021-10-31 NOTE — Telephone Encounter (Signed)
errror

## 2021-11-03 ENCOUNTER — Other Ambulatory Visit: Payer: Self-pay

## 2021-11-03 ENCOUNTER — Ambulatory Visit: Payer: Medicare Other | Admitting: Cardiology

## 2021-11-03 ENCOUNTER — Encounter: Payer: Self-pay | Admitting: Cardiology

## 2021-11-03 VITALS — BP 120/62 | HR 58 | Ht 61.5 in | Wt 164.8 lb

## 2021-11-03 DIAGNOSIS — I340 Nonrheumatic mitral (valve) insufficiency: Secondary | ICD-10-CM

## 2021-11-03 DIAGNOSIS — I5032 Chronic diastolic (congestive) heart failure: Secondary | ICD-10-CM

## 2021-11-03 DIAGNOSIS — N185 Chronic kidney disease, stage 5: Secondary | ICD-10-CM

## 2021-11-03 DIAGNOSIS — I272 Pulmonary hypertension, unspecified: Secondary | ICD-10-CM

## 2021-11-03 DIAGNOSIS — I471 Supraventricular tachycardia: Secondary | ICD-10-CM | POA: Diagnosis not present

## 2021-11-03 DIAGNOSIS — Z992 Dependence on renal dialysis: Secondary | ICD-10-CM

## 2021-11-03 DIAGNOSIS — E1122 Type 2 diabetes mellitus with diabetic chronic kidney disease: Secondary | ICD-10-CM | POA: Diagnosis not present

## 2021-11-03 DIAGNOSIS — N186 End stage renal disease: Secondary | ICD-10-CM

## 2021-11-03 DIAGNOSIS — Z794 Long term (current) use of insulin: Secondary | ICD-10-CM

## 2021-11-03 HISTORY — DX: Nonrheumatic mitral (valve) insufficiency: I34.0

## 2021-11-03 HISTORY — DX: Pulmonary hypertension, unspecified: I27.20

## 2021-11-03 NOTE — Patient Instructions (Signed)
Medication Instructions:  Your physician recommends that you continue on your current medications as directed. Please refer to the Current Medication list given to you today.  *If you need a refill on your cardiac medications before your next appointment, please call your pharmacy*   Lab Work: Your physician recommends that you return for lab work in: [place] in 2 months fasting  If you have labs (blood work) drawn today and your tests are completely normal, you will receive your results only by: [facility] (if you have MyChart) OR A paper copy in the mail If you have any lab test that is abnormal or we need to change your treatment, we will call you to review the results.   Testing/Procedures: None   Follow-Up: At [facility], you and your health needs are our priority.  As part of our continuing mission to provide you with exceptional heart care, we have created designated Provider Care Teams.  These Care Teams include your primary Cardiologist (physician) and Advanced Practice Providers (APPs -  Physician Assistants and Nurse Practitioners) who all work together to provide you with the care you need, when you need it.  We recommend signing up for the patient portal called "MyChart".  Sign up information is provided on this After Visit Summary.  MyChart is used to connect with patients for Virtual Visits (Telemedicine).  Patients are able to view lab/test results, encounter notes, upcoming appointments, etc.  Non-urgent messages can be sent to your provider as well.   To learn more about what you can do with MyChart, go to [email or web address].    Your next appointment:   3 month(s)  The format for your next appointment:   In Person  Provider:   Jenne Campus, MD    Other Instructions

## 2021-11-03 NOTE — Addendum Note (Signed)
Addended by: Darrel Reach on: 11/03/2021 10:09 AM   Modules accepted: Orders

## 2021-11-03 NOTE — Progress Notes (Signed)
Cardiology Office Note:    Date:  11/03/2021   ID:  [name], DOB 1941/05/18, MRN [id number]  PCP:  Raina Mina., MD  Cardiologist:  Jenne Campus, MD    Referring MD: Raina Mina., MD   Chief Complaint  Patient presents with   Chest Pain    History of Present Illness:    Anna Carr is a 81 y.o. female is a very complex lady she does have chronic kidney failure she is being on dialysis, also essential hypertension, dyslipidemia, recently she ended up coming to the hospital because of chest pain.  She was find to have minimal spell of troponin she was transferred to [facility].  At the same time she was diagnosed with GI bleed.  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  Echocardiogram performed in the hospital showed moderate to severe mitral regurgitation with pulmonary hypertension.  She does have history of diastolic dysfunction as well as some paroxysmal atrial tachycardias. She comes to my office for follow-up.  I did review extensive records from different hospitals for this visit.  Most of the time she is doing well.  She denies have any chest pain tightness squeezing pressure burning chest pain.  Still complain of being weak tired and fatigued.  She does have some shortness of breath but nothing unusual, no proximal call dyspnea, minimal swelling of lower extremities.  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   Type 2 diabetes mellitus, with long-term current use of insulin ([place]) 03/11/2019    Past Surgical History:  Procedure Laterality Date   A/V FISTULAGRAM Left 02/17/2021   Procedure: A/V FISTULAGRAM;  Surgeon: Waynetta Sandy, MD;  Location: [place] [facility];  Service: Cardiovascular;  Laterality: Left;   APPENDECTOMY     AV FISTULA PLACEMENT Left    AV FISTULA PLACEMENT Left 03/12/2021   Procedure: INSERTION OF LEFT UPPER ARM ARTERIOVENOUS (AV) GORE-TEX GRAFT;  Surgeon: Waynetta Sandy, MD;  Location: [place];  Service: Vascular;  Laterality: Left;   COLONOSCOPY WITH PROPOFOL N/A 03/18/2021   Procedure: COLONOSCOPY WITH PROPOFOL;  Surgeon: Doran Stabler, MD;  Location: [place];  Service: Gastroenterology;  Laterality: N/A;   ENTEROSCOPY N/A 03/21/2021   Procedure: ENTEROSCOPY;  Surgeon: Doran Stabler, MD;  Location: [place];  Service: Gastroenterology;  Laterality: N/A;   ESOPHAGOGASTRODUODENOSCOPY (EGD) WITH PROPOFOL N/A 03/18/2021   Procedure: ESOPHAGOGASTRODUODENOSCOPY (EGD) WITH PROPOFOL;  Surgeon: Doran Stabler, MD;  Location: [place];  Service: Gastroenterology;  Laterality: N/A;   GIVENS CAPSULE STUDY N/A 03/19/2021   Procedure: GIVENS CAPSULE STUDY;  Surgeon: Doran Stabler, MD;  Location: [place];  Service: Gastroenterology;  Laterality: N/A;   HOT HEMOSTASIS N/A 03/21/2021   Procedure: HOT HEMOSTASIS (ARGON PLASMA COAGULATION/BICAP);  Surgeon: Doran Stabler, MD;  Location: [place] [facility];  Service: Gastroenterology;  Laterality: N/A;   HYSTEROSCOPY     POLYPECTOMY  03/18/2021   Procedure: POLYPECTOMY;  Surgeon: Doran Stabler, MD;  Location: [place] [facility];  Service: Gastroenterology;;    Current Medications: Current Meds  Medication Sig   ascorbic acid (VITAMIN C) 1000 MG tablet Take 1,000 mg by mouth daily in the afternoon.   Biotin 5000 MCG TABS Take 5,000 mcg by mouth  daily in the afternoon.   carboxymethylcellulose (REFRESH PLUS) 0.5 % SOLN Place 1 drop into both eyes 3 (three) times daily as needed (dry eyes).   carvedilol (COREG) 6.25 MG tablet Take 6.25 mg by mouth 2 (two) times daily.   Cyanocobalamin (VITAMIN B-12 PO) Take 1 capsule by mouth every evening. Unknown strength   ferric citrate (AURYXIA) 1 GM 210 MG(Fe) tablet Take 210 mg by mouth 3 (three) times daily with meals.   furosemide (LASIX) 80 MG tablet Take 80 mg by mouth daily.   glimepiride (AMARYL) 4 MG tablet Take 4 mg by mouth 2 (two) times daily.   Insulin Glargine-Lixisenatide (SOLIQUA) 100-33 UNT-MCG/ML SOPN Inject 12 Units into the skin in the morning.   Lactobacillus (PROBIOTIC ACIDOPHILUS PO) Take 1 capsule by mouth in the morning.   lidocaine-prilocaine (EMLA) cream Apply 1 application topically as needed (prior to fisula access).   Omega-3 Fatty Acids (FISH OIL) 1000 MG CAPS Take 1,000 mg by mouth in the morning and at bedtime.   pantoprazole (PROTONIX) 40 MG tablet Take 40 mg by mouth daily.   polyethylene glycol (MIRALAX / GLYCOLAX) 17 g packet Take 17 g by mouth daily as needed for moderate constipation.   Vitamin E 400 units TABS Take 800 Units by mouth in the morning.     Allergies:   Patient has no known allergies.   Social History   Socioeconomic History   Marital status: Widowed    Spouse name: Not on file   Number of children: Not on file   Years of education: Not on file   Highest education level: Not on file  Occupational History   Not on file  Tobacco Use   Smoking status: Former    Years: 10.00    Types: Cigarettes   Smokeless tobacco: Never   Tobacco comments:    quit in 1973  Vaping Use   Vaping Use: Never used  Substance and Sexual Activity   Alcohol use: Not Currently   Drug use: Never   Sexual activity: Not on file  Other Topics Concern   Not on file  Social History Narrative   Not on file   Social Determinants of Health   Financial  Resource Strain: Not on file  Food Insecurity: Not on file  Transportation Needs: Not on file  Physical Activity: Not on file  Stress: Not on file  Social Connections: Not on file     Family History: The patient's family history includes Diabetes in her maternal grandfather and mother; Hypertension in her mother. ROS:   Please see the history of present illness.    All 14 point review of systems negative except as described per history of present illness  EKGs/Labs/Other Studies Reviewed:      Recent Labs: 03/21/2021: Magnesium 1.9 04/01/2021: ALT 11; BUN 29; Creatinine, Ser 4.25; Hemoglobin 7.0; Platelets 310; Potassium 3.0; Sodium 127  Recent Lipid Panel    Component Value Date/Time   CHOL 162 03/31/2021 0428   TRIG 185 (H) 03/31/2021 0428   HDL 37 (L) 03/31/2021 8588  CHOLHDL 4.4 03/31/2021 0428   VLDL 37 03/31/2021 0428   LDLCALC 88 03/31/2021 0428    Physical Exam:    VS:  BP 120/62 (BP Location: Right Arm, Patient Position: Sitting)    Pulse (!) 58    Ht 5' 1.5" (1.562 m)    Wt 164 lb 12.8 oz (74.8 kg)    SpO2 93%    BMI 30.63 kg/m     Wt Readings from Last 3 Encounters:  11/03/21 164 lb 12.8 oz (74.8 kg)  04/04/21 157 lb 6.4 oz (71.4 kg)  04/01/21 160 lb 4.4 oz (72.7 kg)     GEN:  Well nourished, well developed in no acute distress HEENT: Normal NECK: No JVD; No carotid bruits LYMPHATICS: No lymphadenopathy CARDIAC: RRR, soft holosystolic murmur grade 1/6 to 2/6 best heard at apex, no rubs, no gallops RESPIRATORY:  Clear to auscultation without rales, wheezing or rhonchi  ABDOMEN: Soft, non-tender, non-distended MUSCULOSKELETAL:  No edema; No deformity  SKIN: Warm and dry LOWER EXTREMITIES: no swelling NEUROLOGIC:  Alert and oriented x 3 PSYCHIATRIC:  Normal affect   ASSESSMENT:    1. Chronic diastolic heart failure ([place])   2. PAT (paroxysmal atrial tachycardia) ([place])   3. Type 2 diabetes mellitus with chronic kidney disease on chronic dialysis, with  long-term current use of insulin ([place])   4. 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 Nonrheumatic mitral valve regurgitation   6. Pulmonary hypertension, unspecified ([place])    PLAN:    In order of problems listed above:  Chronic diastolic congestive heart failure appears to be compensated today.  Her blood pressure is well controlled which I will continue present management.  She is on Lasix which I will continue she is also on dialysis obviously. Paroxysmal atrial tachycardia denies have any symptoms.  She is already on beta-blocker in form of Coreg which I will continue. Type 2 diabetes that being followed by internal medicine team I did review her K PN which show me here hemoglobin A1c of 6.9 this is from May of this year. Dyslipidemia her LDL 88 HDL 37 this is from summer.  She apparently was taking some cholesterol medication but stopped that medication because of constipation.  I told her somebody with diabetes need to be taking cholesterol medication regardless of what the cholesterol is.  The plan will be to recheck fasting lipid profile in about 2 to 3 months and then decide about therapy. Mitral regurgitation which was assessed as moderate to severe.  Again hemodynamically compensated.  The plan will be to keep her blood pressure under control congestive heart failure under control with aggressive diuresis as well as diuretics and then reassess her mitral regurgitation.  Likely overall she is doing well with her New York Heart Association is class II now. Pulmonary hypertension which is multifactorial.  I suspect this is group 2 by New York Heart Association related to most likely mitral regurgitation as well as diastolic dysfunction.  Plan is to keep blood pressure under control and treat her with diuretics.   Medication Adjustments/Labs and Tests Ordered: Current medicines are reviewed at length with the patient today.  Concerns regarding medicines are outlined above.  No orders of  the defined types were placed in this encounter.  Medication changes: No orders of the defined types were placed in this encounter.   Signed, Park Liter, MD, [facility] 11/03/2021 10:03 AM    [place]

## 2021-11-07 ENCOUNTER — Encounter (INDEPENDENT_AMBULATORY_CARE_PROVIDER_SITE_OTHER): Payer: Medicare Other | Admitting: Ophthalmology

## 2021-11-10 ENCOUNTER — Encounter (INDEPENDENT_AMBULATORY_CARE_PROVIDER_SITE_OTHER): Payer: Medicare Other | Admitting: Ophthalmology

## 2021-11-12 ENCOUNTER — Encounter: Payer: Self-pay | Admitting: Gastroenterology

## 2021-12-10 ENCOUNTER — Ambulatory Visit: Payer: Medicare Other | Admitting: Gastroenterology

## 2021-12-10 VITALS — BP 124/80 | HR 88 | Ht 61.5 in | Wt 161.0 lb

## 2021-12-10 DIAGNOSIS — D500 Iron deficiency anemia secondary to blood loss (chronic): Secondary | ICD-10-CM

## 2021-12-10 DIAGNOSIS — K5909 Other constipation: Secondary | ICD-10-CM | POA: Diagnosis not present

## 2021-12-10 DIAGNOSIS — K5521 Angiodysplasia of colon with hemorrhage: Secondary | ICD-10-CM

## 2021-12-10 NOTE — Progress Notes (Signed)
[facility] Consult Note:  History: Anna Carr 12/10/2021  Referring provider: Dr. Kyra Leyland  Reason for consult/chief complaint: Anemia (Gi bleed, discuss capsule testing.)   Subjective  HPI: This is a patient known to Dr. Thornton Park from a hospital consult May 2022 for chronic anemia with heme positive stool. No source on inpatient EGD colonoscopy by me 03/18/2021.  Fair preparation on colonoscopy, 2 diminutive polyps removed Video capsule endoscopy performed, then push enteroscopy 03/21/2021 with the following: A single angioectasia with stigmata of recent bleeding (and adjacent fresh blood) was found in the third/fourth portion of the duodenum. Coagulation for hemostasis using argon plasma at 0.5 liters/minute and 20 watts was successful. Two areas of friable mucosa/probable angioectasias with no bleeding were found in the proximal jejunum. Coagulation for hemostasis using argon plasma at 0.5 liters/minute and 20 watts was successful.  Discharge summary from [facility] (admitted 1/2 to 10/30/2021), patient had anemia and GI bleed, "small bowel enteroscopy, Dr. Ronnald Ramp on 10/29/2021, AVMs throughout duodenum and proximal jejunum treated with APC) Patient apparently discharged and recommended video capsule study, though primary care provider in [place] indicates that is not available at their facility.  Anna Carr tells me that she saw Dr. Lyda Jester of GI in [place] since this recent hospital discharge, and that is who referred her to see [place], as indicated on the referral papers that we have.   Anna Carr was here with her son, she is a good historian, extensive records were reviewed since she had not been seen by our practice since her May 2022 hospitalization at [facility]. She is on iron tablets and says she gets IV iron at dialysis.  Her blood counts are being checked regularly by primary care and at dialysis, and she is told they are just not coming up  as they should, thus there is concern for ongoing GI bleeding. Anna Carr has black stool that she attributes to the iron,, and says that it has been tarry at times, typically when she has become more severely anemic requiring hospital stay. She also describes chronic constipation for many years. The office note from Dr. Lyda Jester on 11/12/2021 was reviewed.  ROS:  Review of Systems No chest pain She is dyspneic with exertion and has generalized fatigue Remainder systems negative except as above  Past Medical History: Past Medical History:  Diagnosis Date   Anemia, chronic disease 01/23/2017   AVM (arteriovenous malformation)    CHF with left ventricular diastolic dysfunction, NYHA class 1 ([place]) 02/02/2017   Cholelithiasis 03/30/2021   Colon polyp    COPD mixed type ([place])    ESRD (end stage renal disease) ([place]) 07/18/2019   History of blood transfusion    Hyperlipidemia    Hypertension    Insomnia    Major depressive disorder, recurrent ([place]) 12/22/2015   Osteoarthritis    Osteopenia    Pneumonia    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   SIADH (syndrome of inappropriate ADH production) ([place]) 03/29/2019   Type 2 diabetes mellitus, with long-term current use of insulin ([place]) 03/11/2019     Past Surgical History: Past Surgical History:  Procedure Laterality Date   A/V FISTULAGRAM Left 02/17/2021   Procedure: A/V FISTULAGRAM;  Surgeon: Waynetta Sandy, MD;  Location: [place] [facility];  Service: Cardiovascular;  Laterality: Left;   APPENDECTOMY     AV FISTULA PLACEMENT Left    AV FISTULA PLACEMENT Left 03/12/2021   Procedure: INSERTION OF LEFT UPPER ARM ARTERIOVENOUS (AV) GORE-TEX GRAFT;  Surgeon: Waynetta Sandy,  MD;  Location: [facility];  Service: Vascular;  Laterality: Left;   COLONOSCOPY WITH PROPOFOL N/A 03/18/2021   Procedure: COLONOSCOPY WITH PROPOFOL;  Surgeon: Doran Stabler, MD;  Location: [place];  Service: Gastroenterology;  Laterality: N/A;    CYSTECTOMY     ENTEROSCOPY N/A 03/21/2021   Procedure: ENTEROSCOPY;  Surgeon: Doran Stabler, MD;  Location: [place];  Service: Gastroenterology;  Laterality: N/A;   ESOPHAGOGASTRODUODENOSCOPY (EGD) WITH PROPOFOL N/A 03/18/2021   Procedure: ESOPHAGOGASTRODUODENOSCOPY (EGD) WITH PROPOFOL;  Surgeon: Doran Stabler, MD;  Location: [place];  Service: Gastroenterology;  Laterality: N/A;   GIVENS CAPSULE STUDY N/A 03/19/2021   Procedure: GIVENS CAPSULE STUDY;  Surgeon: Doran Stabler, MD;  Location: [place];  Service: Gastroenterology;  Laterality: N/A;   HOT HEMOSTASIS N/A 03/21/2021   Procedure: HOT HEMOSTASIS (ARGON PLASMA COAGULATION/BICAP);  Surgeon: Doran Stabler, MD;  Location: [place];  Service: Gastroenterology;  Laterality: N/A;   HYSTEROSCOPY     POLYPECTOMY  03/18/2021   Procedure: POLYPECTOMY;  Surgeon: Doran Stabler, MD;  Location: [facility];  Service: Gastroenterology;;     Family History: Family History  Problem Relation Age of Onset   Diabetes Mother    Hypertension Mother    Diabetes Maternal Grandfather    Liver disease Other     Social History: Social History   Socioeconomic History   Marital status: Widowed    Spouse name: Not on file   Number of children: 3   Years of education: Not on file   Highest education level: Not on file  Occupational History   Not on file  Tobacco Use   Smoking status: Former    Years: 10.00    Types: Cigarettes   Smokeless tobacco: Never   Tobacco comments:    quit in 1973  Vaping Use   Vaping Use: Never used  Substance and Sexual Activity   Alcohol use: Not Currently   Drug use: Never   Sexual activity: Not Currently  Other Topics Concern   Not on file  Social History Narrative   Not on file   Social Determinants of Health   Financial Resource Strain: Not on file  Food Insecurity: Not on file  Transportation Needs: Not on file  Physical Activity: Not on file  Stress: Not  on file  Social Connections: Not on file    Allergies: No Known Allergies  Outpatient Meds: Current Outpatient Medications  Medication Sig Dispense Refill   ascorbic acid (VITAMIN C) 1000 MG tablet Take 1,000 mg by mouth daily in the afternoon.     Biotin 5000 MCG TABS Take 5,000 mcg by mouth daily in the afternoon.     carboxymethylcellulose (REFRESH PLUS) 0.5 % SOLN Place 1 drop into both eyes 3 (three) times daily as needed (dry eyes).     carvedilol (COREG) 6.25 MG tablet Take 6.25 mg by mouth 2 (two) times daily.     Cyanocobalamin (VITAMIN B-12 PO) Take 1 capsule by mouth every evening. Unknown strength     ferric citrate (AURYXIA) 1 GM 210 MG(Fe) tablet Take 210 mg by mouth 3 (three) times daily with meals.     furosemide (LASIX) 80 MG tablet Take 80 mg by mouth daily.     glimepiride (AMARYL) 4 MG tablet Take 4 mg by mouth 2 (two) times daily.     Insulin Glargine-Lixisenatide (SOLIQUA) 100-33 UNT-MCG/ML SOPN Inject 12 Units into the skin in the morning.  Lactobacillus (PROBIOTIC ACIDOPHILUS PO) Take 1 capsule by mouth in the morning.     lidocaine-prilocaine (EMLA) cream Apply 1 application topically as needed (prior to fisula access).     Methoxy PEG-Epoetin Beta (MIRCERA IJ) Mircera     Omega-3 Fatty Acids (FISH OIL) 1000 MG CAPS Take 1,000 mg by mouth in the morning and at bedtime.     pantoprazole (PROTONIX) 40 MG tablet Take 40 mg by mouth daily.     polyethylene glycol (MIRALAX / GLYCOLAX) 17 g packet Take 17 g by mouth daily as needed for moderate constipation.     Vitamin E 400 units TABS Take 800 Units by mouth in the morning.     No current facility-administered medications for this visit.      ___________________________________________________________________ Objective   Exam:  BP 124/80    Pulse 88    Ht 5' 1.5" (1.562 m)    Wt 161 lb (73 kg)    BMI 29.93 kg/m  Wt Readings from Last 3 Encounters:  12/10/21 161 lb (73 kg)  11/03/21 164 lb 12.8 oz (74.8  kg)  04/04/21 157 lb 6.4 oz (71.4 kg)    General: Pleasant and conversational, ambulatory Eyes: sclera anicteric, no redness ENT: oral mucosa moist without lesions, no cervical or supraclavicular lymphadenopathy CV: RRR without murmur, S1/S2, no JVD, no peripheral edema.  Fistula with palpable thrill Resp: clear to auscultation bilaterally, normal RR and effort noted GI: soft, no tenderness, with active bowel sounds. No guarding or palpable organomegaly noted. Skin; warm and dry, no rash or jaundice noted Neuro: awake, alert and oriented x 3. Normal gross motor function and fluent speech  Labs:  11/05/2021 hemoglobin 8.7, platelet 310, WBC 7.5  10/29/2021 hemoglobin 8.7  11/13/2021, hemoglobin 8.0 11/20/2021 hemoglobin 7.8 11/27/2021 hemoglobin 8.6 12/04/2021 hemoglobin 8.1   Report of small bowel enteroscopy by Dr. Marcy Salvo on 10/29/2021  Esophagus normal. Stomach contained a single nonbleeding AVM treated with APC Pediatric colonoscope advanced to the jejunum.  Multiple nonbleeding AVMs throughout duodenum and proximal jejunum treated with APC "a few clots were present which were easily washed away without underlying lesions"    Assessment: Encounter Diagnoses  Name Primary?   Anemia due to chronic blood loss Yes   AVM (arteriovenous malformation) of small bowel, acquired with hemorrhage    Chronic constipation     Challenging case of recurrent occult and overt small bowel bleeding from AVMs despite repeated endoscopic ablation. Patient's primary outpatient GI physician in [place] is requesting small bowel capsule study to assess extent of the small bowel AVMs.  This was apparently also recommended during the recent hospitalization but not performed (?  Availability of that technology at [facility])  I explained the difficult nature of this situation, and that even if more small bowel AVMs are localized on video capsule study, it is not yet clear whether they will be  amenable to any endoscopic therapy.  She may also need to be considered for long-term octreotide, though that is somewhat logistically difficult to manage in the outpatient setting.  Plan:  I have scheduled the small bowel video capsule study at our office.  Instructions given to the patient, risks and benefits reviewed including chance of capsule retention. She was able to do a video capsule study at our hospital last May, and hopefully it will go well again this time.  I will forward this note as well as the video capsule report to Dr. Tarri Glenn, the patient's primary GI physician at  this office for further coordination of care and communication with Dr. Lyda Jester in [place] and the patient's nephrologist.   50 minutes were spent on this encounter (including chart review, history/exam, counseling/coordination of care, and documentation) > 50% of that time was spent on counseling and coordination of care.   [name]  CC: Referring provider noted above

## 2021-12-10 NOTE — Patient Instructions (Signed)
If you are age 80 or older, your body mass index should be between 23-30. Your Body mass index is 29.93 kg/m. If this is out of the aforementioned range listed, please consider follow up with your Primary Care Provider.  If you are age 80 or younger, your body mass index should be between 19-25. Your Body mass index is 29.93 kg/m. If this is out of the aformentioned range listed, please consider follow up with your Primary Care Provider.   ________________________________________________________  The [place] [facility] providers would like to encourage you to use [facility] to communicate with providers for non-urgent requests or questions.  Due to long hold times on the telephone, sending your provider a message by [facility] may be a faster and more efficient way to get a response.  Please allow 48 business hours for a response.  Please remember that this is for non-urgent requests.  _______________________________________________________  CAPSULE ENDOSCOPY PATIENT INSTRUCTION SHEET  HALLE DAVLIN 03/24/1941 [id number]    Seven (7) days prior to capsule endoscopy stop taking iron supplements and carafate.  12-15-2021 Two (2) days prior to capsule endoscopy stop taking aspirin or any arthritis drugs.  12-16-2021 Day before capsule endoscopy purchase a 238 gram bottle of Miralax from the laxative section of your drug store, and a 32 oz. bottle of Gatorade (no red).    12-16-2021 One (1) day prior to capsule endoscopy: Stop smoking. Eat a regular diet until 12:00 Noon. After 12:00 Noon take only the following: Black coffee  Jell-O (no fruit or red Jell-o) Water   Bouillon (chicken or beef) 7-Up   Cranberry Juice Tea   Kool-Aid Popsicle (not red) Sprite   Coke Ginger Ale  Pepsi Mountain Dew Gatorade At 6:00 pm the evening before your appointment, drink 7 capfuls (105 grams) of Miralax with 32 oz. Gatorade. Drink 8 oz every 15 minutes until gone. Nothing to eat or drink after midnight except  medications with a sip of water.  12-17-2021 Day of capsule endoscopy:  No medications for 2 hours prior to your test.  Please arrive at [facility]  3rd floor patient registration area by 8:30 am on: 12-17-2021.   For any questions: Call [place] at [phone] and ask to speak with one of the capsule endoscopy nurses.  YOU WILL NEED TO RETURN THE EQUIPMENT AT 4 PM ON THE DAY OF THE PROCEDURE.  PLEASE KEEP THIS IN MIND WHEN SCHEDULING.    Small Bowel Capsule Endoscopy  What you should know: Small Bowel capsule endoscopy is a procedure that takes pictures of the inside of your small intestine (bowel).  Your small bowel connects to your stomach on one end, and your large bowel (colon) on the other.  A capsule endoscopy is done by swallowing a pill size camera.  The capsule moves through your stomach and into your small bowel, where pictures are taken.   You may need a small bowel capsule endoscopy if you have symptoms, such as blood in your stool, chronic stomach pain, and diarrhea.  The pictures may show if you have growths, swelling, and bleeding area in you small bowel.  A capsule endoscopy may also show if diseases such as Crohn's or celiac disease are causing your symptoms.  Having a small bowel capsule endoscopy may help you and your caregiver learn the cause of your symptoms.  Learning what is causing your symptoms allows you to receive needed treatment and prevent further problems. Risks: You may have stomach pain during your procedure.  The pictures taken by the capsule may not be clear.   The pictures may not show the cause of your symptoms.   You may need another endoscopy procedure.   The capsule may get trapped in your esophagus or intestines. You may need surgery or additional procedures to remove the capsule from your body.    Before your procedure: You will be instructed to stop certain prescription medications or over- the -counter medications prior to the  procedure.   The day before your scheduled appointment you will need to be on a restricted diet and will need to drink a bowel prep that will clean out your bowels.   The day of the procedure: You may drive yourself to the procedure.   You will need to plan on 2 trips to the office on the day of the procedure. Morning: Plan to be at the office about 45 minutes. The morning of the procedure a sensor belt and recorder will be placed on you.  You will wear this for 8 hours.  (The sensor belt transfers pictures of your small bowel to the recorder.)   You will be given a pill-sized capsule endoscope to swallow.  Once you swallow the capsule it will travel through your body the same way food does, constantly taking pictures along the way.  The capsule takes 2-3 pictures a second.   Once you have left the office you may go about your normal day with a few exceptions: You may not go near a MRI machine or a radio or television towers; You need to avoid other patients having capsule endoscopy; You will be given a written diet to follow for the day.  Afternoon: You will need to be return to the office at your designated time. The sensors belt will be removed You will need to be at the office about 15 minutes.

## 2021-12-14 NOTE — Progress Notes (Signed)
Thanks

## 2021-12-17 ENCOUNTER — Encounter: Payer: Self-pay | Admitting: Gastroenterology

## 2021-12-17 ENCOUNTER — Ambulatory Visit (INDEPENDENT_AMBULATORY_CARE_PROVIDER_SITE_OTHER): Payer: Medicare Other | Admitting: Gastroenterology

## 2021-12-17 DIAGNOSIS — K31811 Angiodysplasia of stomach and duodenum with bleeding: Secondary | ICD-10-CM

## 2021-12-17 DIAGNOSIS — D500 Iron deficiency anemia secondary to blood loss (chronic): Secondary | ICD-10-CM

## 2021-12-17 NOTE — Patient Instructions (Signed)
You may have clear liquids beginning at 10:30 am after ingesting the capsule.    You can have a light lunch at 12:30 pm; sandwich and half bowl of soup.  Return to the office at 4 pm to return the equipment.   Return to you normal diet at 5 pm.   Call [phone] and ask for Anna Carr BSN RN if you have any questions.  You should pass the capsule in your stool 8-48 hours after ingestion. If you have not passed the capsule, after 72 hours, please contact the office at [phone].    

## 2021-12-17 NOTE — Progress Notes (Signed)
SN: [id number] Exp: [id number] LOT: [id number]  Patient arrived for VCE. Reported the prep went well. This RN explained capsule dietary restrictions for the next few hours. Pt advised to return at 4 pm to return capsule equipment.  Patient verbalized understanding. Opened capsule, ensured capsule was flashing prior to the patient swallowing the capsule. Patient swallowed capsule without difficulty.  Patient told to call the office with any questions and if capsule has not passed after 72 hours. No further questions by the conclusion of the visit.

## 2021-12-19 ENCOUNTER — Telehealth: Payer: Self-pay | Admitting: Gastroenterology

## 2021-12-19 ENCOUNTER — Encounter (HOSPITAL_COMMUNITY): Payer: Self-pay | Admitting: Internal Medicine

## 2021-12-19 ENCOUNTER — Observation Stay (HOSPITAL_COMMUNITY)
Admission: EM | Admit: 2021-12-19 | Discharge: 2021-12-21 | Disposition: A | Discharge status: Home / Self Care | Payer: Medicare Other | Attending: Internal Medicine | Admitting: Internal Medicine

## 2021-12-19 DIAGNOSIS — D631 Anemia in chronic kidney disease: Secondary | ICD-10-CM

## 2021-12-19 DIAGNOSIS — Z794 Long term (current) use of insulin: Secondary | ICD-10-CM | POA: Insufficient documentation

## 2021-12-19 DIAGNOSIS — E1122 Type 2 diabetes mellitus with diabetic chronic kidney disease: Secondary | ICD-10-CM | POA: Insufficient documentation

## 2021-12-19 DIAGNOSIS — Z20822 Contact with and (suspected) exposure to covid-19: Secondary | ICD-10-CM | POA: Insufficient documentation

## 2021-12-19 DIAGNOSIS — Z79899 Other long term (current) drug therapy: Secondary | ICD-10-CM | POA: Insufficient documentation

## 2021-12-19 DIAGNOSIS — K5521 Angiodysplasia of colon with hemorrhage: Secondary | ICD-10-CM | POA: Diagnosis present

## 2021-12-19 DIAGNOSIS — J449 Chronic obstructive pulmonary disease, unspecified: Secondary | ICD-10-CM | POA: Insufficient documentation

## 2021-12-19 DIAGNOSIS — I132 Hypertensive heart and chronic kidney disease with heart failure and with stage 5 chronic kidney disease, or end stage renal disease: Secondary | ICD-10-CM | POA: Insufficient documentation

## 2021-12-19 DIAGNOSIS — I10 Essential (primary) hypertension: Secondary | ICD-10-CM | POA: Diagnosis present

## 2021-12-19 DIAGNOSIS — D500 Iron deficiency anemia secondary to blood loss (chronic): Secondary | ICD-10-CM | POA: Diagnosis not present

## 2021-12-19 DIAGNOSIS — Z992 Dependence on renal dialysis: Secondary | ICD-10-CM | POA: Insufficient documentation

## 2021-12-19 DIAGNOSIS — E871 Hypo-osmolality and hyponatremia: Secondary | ICD-10-CM

## 2021-12-19 DIAGNOSIS — E785 Hyperlipidemia, unspecified: Secondary | ICD-10-CM | POA: Diagnosis present

## 2021-12-19 DIAGNOSIS — K31819 Angiodysplasia of stomach and duodenum without bleeding: Secondary | ICD-10-CM | POA: Diagnosis not present

## 2021-12-19 DIAGNOSIS — Z87891 Personal history of nicotine dependence: Secondary | ICD-10-CM | POA: Insufficient documentation

## 2021-12-19 DIAGNOSIS — K922 Gastrointestinal hemorrhage, unspecified: Secondary | ICD-10-CM

## 2021-12-19 DIAGNOSIS — I509 Heart failure, unspecified: Secondary | ICD-10-CM | POA: Diagnosis not present

## 2021-12-19 DIAGNOSIS — E222 Syndrome of inappropriate secretion of antidiuretic hormone: Secondary | ICD-10-CM | POA: Diagnosis present

## 2021-12-19 DIAGNOSIS — N186 End stage renal disease: Secondary | ICD-10-CM | POA: Diagnosis present

## 2021-12-19 DIAGNOSIS — E1169 Type 2 diabetes mellitus with other specified complication: Secondary | ICD-10-CM | POA: Diagnosis present

## 2021-12-19 DIAGNOSIS — I5030 Unspecified diastolic (congestive) heart failure: Secondary | ICD-10-CM | POA: Diagnosis not present

## 2021-12-19 DIAGNOSIS — E782 Mixed hyperlipidemia: Secondary | ICD-10-CM

## 2021-12-19 HISTORY — DX: Gastrointestinal hemorrhage, unspecified: K92.2

## 2021-12-19 LAB — RESP PANEL BY RT-PCR (FLU A&B, COVID) ARPGX2
Influenza A by PCR: NEGATIVE
Influenza B by PCR: NEGATIVE
SARS Coronavirus 2 by RT PCR: NEGATIVE

## 2021-12-19 LAB — CBC WITH DIFFERENTIAL/PLATELET
Abs Immature Granulocytes: 0.07 10*3/uL (ref 0.00–0.07)
Basophils Absolute: 0.1 10*3/uL (ref 0.0–0.1)
Basophils Relative: 1 %
Eosinophils Absolute: 0.1 10*3/uL (ref 0.0–0.5)
Eosinophils Relative: 2 %
HCT: 27.6 % — ABNORMAL LOW (ref 36.0–46.0)
Hemoglobin: 8.5 g/dL — ABNORMAL LOW (ref 12.0–15.0)
Immature Granulocytes: 1 %
Lymphocytes Relative: 23 %
Lymphs Abs: 1.6 10*3/uL (ref 0.7–4.0)
MCH: 30.5 pg (ref 26.0–34.0)
MCHC: 30.8 g/dL (ref 30.0–36.0)
MCV: 98.9 fL (ref 80.0–100.0)
Monocytes Absolute: 0.7 10*3/uL (ref 0.1–1.0)
Monocytes Relative: 9 %
Neutro Abs: 4.6 10*3/uL (ref 1.7–7.7)
Neutrophils Relative %: 64 %
Platelets: 222 10*3/uL (ref 150–400)
RBC: 2.79 MIL/uL — ABNORMAL LOW (ref 3.87–5.11)
RDW: 14.8 % (ref 11.5–15.5)
WBC: 7.2 10*3/uL (ref 4.0–10.5)
nRBC: 0 % (ref 0.0–0.2)

## 2021-12-19 LAB — COMPREHENSIVE METABOLIC PANEL
ALT: 8 U/L (ref 0–44)
AST: 16 U/L (ref 15–41)
Albumin: 3 g/dL — ABNORMAL LOW (ref 3.5–5.0)
Alkaline Phosphatase: 80 U/L (ref 38–126)
Anion gap: 11 (ref 5–15)
BUN: 34 mg/dL — ABNORMAL HIGH (ref 8–23)
CO2: 26 mmol/L (ref 22–32)
Calcium: 8.8 mg/dL — ABNORMAL LOW (ref 8.9–10.3)
Chloride: 95 mmol/L — ABNORMAL LOW (ref 98–111)
Creatinine, Ser: 3.56 mg/dL — ABNORMAL HIGH (ref 0.44–1.00)
GFR, Estimated: 12 mL/min — ABNORMAL LOW (ref >60)
Glucose, Bld: 148 mg/dL — ABNORMAL HIGH (ref 70–99)
Potassium: 3.6 mmol/L (ref 3.5–5.1)
Sodium: 132 mmol/L — ABNORMAL LOW (ref 135–145)
Total Bilirubin: <0.1 mg/dL — ABNORMAL LOW (ref 0.3–1.2)
Total Protein: 6 g/dL — ABNORMAL LOW (ref 6.5–8.1)

## 2021-12-19 LAB — GLUCOSE, CAPILLARY: Glucose-Capillary: 73 mg/dL (ref 70–99)

## 2021-12-19 LAB — TYPE AND SCREEN
ABO/RH(D): A POS
Antibody Screen: NEGATIVE

## 2021-12-19 MED ORDER — POLYVINYL ALCOHOL 1.4 % OP SOLN: 1.0000 [drp] | OPHTHALMIC | Status: DC | PRN

## 2021-12-19 MED ORDER — INSULIN ASPART 100 UNIT/ML IJ SOLN
0.0000 [IU] | Freq: Three times a day (TID) | INTRAMUSCULAR | Status: DC
  Administered 2021-12-20 – 2021-12-21 (×2): 3 [IU] via SUBCUTANEOUS

## 2021-12-19 MED ORDER — PANTOPRAZOLE SODIUM 40 MG IV SOLR
40.0000 mg | Freq: Once | INTRAVENOUS | Status: AC
  Administered 2021-12-19: 40 mg via INTRAVENOUS
  Filled 2021-12-19: qty 10

## 2021-12-19 MED ORDER — CARVEDILOL 6.25 MG PO TABS
6.2500 mg | ORAL_TABLET | Freq: Two times a day (BID) | ORAL | Status: DC
  Administered 2021-12-20 – 2021-12-21 (×3): 6.25 mg via ORAL
  Filled 2021-12-19 (×3): qty 1

## 2021-12-19 MED ORDER — ACETAMINOPHEN 650 MG RE SUPP: 650.0000 mg | Freq: Four times a day (QID) | RECTAL | Status: DC | PRN

## 2021-12-19 MED ORDER — SODIUM CHLORIDE 0.9 % IV SOLN: INTRAVENOUS | Status: DC

## 2021-12-19 MED ORDER — ACETAMINOPHEN 325 MG PO TABS: 650.0000 mg | ORAL_TABLET | Freq: Four times a day (QID) | ORAL | Status: DC | PRN

## 2021-12-19 MED ORDER — PANTOPRAZOLE SODIUM 40 MG IV SOLR: 40.0000 mg | Freq: Once | INTRAVENOUS | Status: DC

## 2021-12-19 MED ORDER — SODIUM CHLORIDE 0.9 % IV BOLUS
250.0000 mL | Freq: Once | INTRAVENOUS | Status: DC
Start: 2021-12-19 — End: 2021-12-19

## 2021-12-19 MED ORDER — PANTOPRAZOLE SODIUM 40 MG PO TBEC
40.0000 mg | DELAYED_RELEASE_TABLET | Freq: Every day | ORAL | Status: DC
  Administered 2021-12-20: 40 mg via ORAL
  Filled 2021-12-19: qty 1

## 2021-12-19 MED ORDER — SODIUM CHLORIDE 0.9% FLUSH
3.0000 mL | Freq: Two times a day (BID) | INTRAVENOUS | Status: DC
  Administered 2021-12-19 – 2021-12-21 (×4): 3 mL via INTRAVENOUS

## 2021-12-19 MED ORDER — INSULIN GLARGINE-YFGN 100 UNIT/ML ~~LOC~~ SOLN
5.0000 [IU] | Freq: Every day | SUBCUTANEOUS | Status: DC
  Administered 2021-12-20: 5 [IU] via SUBCUTANEOUS
  Filled 2021-12-19 (×3): qty 0.05

## 2021-12-19 MED ORDER — CARBOXYMETHYLCELLULOSE SODIUM 0.5 % OP SOLN: 1.0000 [drp] | Freq: Three times a day (TID) | OPHTHALMIC | Status: DC | PRN

## 2021-12-19 NOTE — Telephone Encounter (Signed)
I called Dr. Crisoforo [place] office and his office is closed on Friday's. They will open again on Monday at 8:30 am. Patient will be directed to [facility].   I attempted to reach patient twice at her listed number. I left a vm for her to return call. I also attempted to reach her son, Jenny Reichmann, I left him a vm to return call as well.

## 2021-12-19 NOTE — Consult Note (Signed)
[facility] Consult Note   History Anna Carr MRN # [id number]  Date of Admission: 12/19/2021 Date of Consultation: 12/19/2021 Referring physician: Dr. Trilby Drummer Candace Gallus, MD Primary Care Provider: Raina Mina., MD Primary Gastroenterologist: Dr. Kyra Leyland [place], [place])   Reason for Consultation/Chief Complaint: Small bowel AVMs with active bleeding  HPI  This is a 81 year old woman known to our practice from inpatient management May 2022 and office visit with me on 12/10/2021. Clinical details of her recurrent GI bleeding are included in my extensive note from the recent visit.  There is a telephone note from earlier today regarding this patient's video capsule study with ongoing bleeding and recommendation for hospital admission.  She has had chronic black stool since being on iron, difficult for her to say whether there is been any active bleeding lately.  She has generalized fatigue and dyspnea as before.  Berta underwent dialysis uneventfully yesterday and says she is due tomorrow.  She denies chest pain abdominal pain, dysphagia or vomiting.  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  Patient was sent to our office as an outpatient by Dr. Lyda Jester since that practice and facility does not perform small bowel video capsule study.  Video capsule study from 2 days ago was read earlier this morning and shows active bleeding in the proximal small bowel, approximately 5 minutes after the first duodenal image.  ROS:  Constitutional: Fatigue Respiratory:   Dyspnea   All other systems are negative except as noted above in the HPI  Past Medical History Past Medical History:  Diagnosis Date   Anemia, chronic disease 01/23/2017   Arteriovenous fistula, acquired ([place]) 11/02/2019   AVM (arteriovenous malformation)    C.  difficile colitis 03/30/2021   CHF with left ventricular diastolic dysfunction, NYHA class 1 ([place]) 02/02/2017   Cholelithiasis 03/30/2021   Colon polyp    COPD mixed type ([place])    ESRD (end stage renal disease) ([place]) 07/18/2019   History of blood transfusion    Hyperlipidemia    Hypertension    Hypervolemia 11/13/2019   Hypovolemia 10/27/2021   Insomnia    Major depressive disorder, recurrent ([place]) 12/22/2015   Malaise and fatigue 12/23/2015   Osteoarthritis    Osteopenia    PAC (premature atrial contraction) 10/04/2020   Pain, unspecified 12/06/2019   Pneumonia    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   SIADH (syndrome of inappropriate ADH production) ([place]) 03/29/2019   Type 2 diabetes mellitus, with long-term current use of insulin ([place]) 03/11/2019    Past Surgical History Past Surgical History:  Procedure Laterality Date   A/V FISTULAGRAM Left 02/17/2021   Procedure: A/V FISTULAGRAM;  Surgeon: Waynetta Sandy, MD;  Location: [place] [facility];  Service: Cardiovascular;  Laterality: Left;   APPENDECTOMY     AV FISTULA PLACEMENT Left    AV FISTULA PLACEMENT Left 03/12/2021   Procedure: INSERTION OF LEFT UPPER ARM ARTERIOVENOUS (AV) GORE-TEX GRAFT;  Surgeon: Waynetta Sandy, MD;  Location: [place];  Service: Vascular;  Laterality: Left;   COLONOSCOPY WITH PROPOFOL N/A 03/18/2021   Procedure: COLONOSCOPY WITH PROPOFOL;  Surgeon: Doran Stabler, MD;  Location: [place];  Service: Gastroenterology;  Laterality: N/A;   CYSTECTOMY     ENTEROSCOPY N/A 03/21/2021   Procedure: ENTEROSCOPY;  Surgeon: Doran Stabler, MD;  Location: [place];  Service: Gastroenterology;  Laterality: N/A;  ESOPHAGOGASTRODUODENOSCOPY (EGD) WITH PROPOFOL N/A 03/18/2021   Procedure: ESOPHAGOGASTRODUODENOSCOPY (EGD) WITH PROPOFOL;  Surgeon: Doran Stabler, MD;  Location: [place];  Service: Gastroenterology;  Laterality: N/A;   GIVENS CAPSULE STUDY N/A 03/19/2021   Procedure:  GIVENS CAPSULE STUDY;  Surgeon: Doran Stabler, MD;  Location: [place];  Service: Gastroenterology;  Laterality: N/A;   HOT HEMOSTASIS N/A 03/21/2021   Procedure: HOT HEMOSTASIS (ARGON PLASMA COAGULATION/BICAP);  Surgeon: Doran Stabler, MD;  Location: [place];  Service: Gastroenterology;  Laterality: N/A;   HYSTEROSCOPY     POLYPECTOMY  03/18/2021   Procedure: POLYPECTOMY;  Surgeon: Doran Stabler, MD;  Location: [facility] END[facility]COPY;  Service: Gastroenterology;;    Family History Family History  Problem Relation Age of Onset   Diabetes Mother    Hypertension Mother    Diabetes Maternal Grandfather    Liver disease Other     Social History Social History   Socioeconomic History   Marital status: Widowed    Spouse name: Not on file   Number of children: 3   Years of education: Not on file   Highest education level: Not on file  Occupational History   Not on file  Tobacco Use   Smoking status: Former    Years: 10.00    Types: Cigarettes   Smokeless tobacco: Never   Tobacco comments:    quit in 1973  Vaping Use   Vaping Use: Never used  Substance and Sexual Activity   Alcohol use: Not Currently   Drug use: Never   Sexual activity: Not Currently  Other Topics Concern   Not on file  Social History Narrative   Not on file   Social Determinants of Health   Financial Resource Strain: Not on file  Food Insecurity: Not on file  Transportation Needs: Not on file  Physical Activity: Not on file  Stress: Not on file  Social Connections: Not on file    Allergies No Known Allergies  Outpatient Meds Home medications from the H+P and/or nursing med reconciliation reviewed. Admission history and physical by [facility] reviewed including active medications   _____________________________________________________________________ Objective   Exam:  Current vital signs  Patient Vitals for the past 8 hrs:  BP Temp Temp src Pulse Resp SpO2   12/19/21 2230 (!) 155/83 -- -- 74 (!) 25 100 %  12/19/21 2152 (!) 139/91 -- -- 94 20 99 %  12/19/21 2100 (!) 126/105 -- -- 68 17 100 %  12/19/21 2015 112/82 -- -- 67 15 100 %  12/19/21 1845 (!) 147/70 -- -- 76 15 99 %  12/19/21 1830 (!) 147/66 -- -- 80 11 100 %  12/19/21 1559 (!) 153/53 98.1 F (36.7 C) Oral 74 16 100 %   No intake or output data in the 24 hours ending 12/19/21 2248  Physical Exam:   General: this is a pleasant elderly patient in no acute distress.  She looks comfortable, sitting on the edge of her ED stretcher doing crossword puzzles. Eyes: sclera anicteric, no redness ENT: oral mucosa moist without lesions, no cervical or supraclavicular lymphadenopathy CV: RRR with a soft systolic murmur, no JVD, no peripheral edema.  Left upper extremity AV fistula with palpable thrill Resp: clear to auscultation bilaterally, normal RR and effort noted GI: soft, no tenderness, with active bowel sounds. No guarding or palpable organomegaly noted Skin; warm and dry, no rash or jaundice noted Neuro: awake, alert and oriented x 3. Normal gross motor function  and fluent speech.  Labs:  CBC Latest Ref Rng & Units 12/19/2021 04/01/2021 03/31/2021  WBC 4.0 - 10.5 K/uL 7.2 7.4 9.8  Hemoglobin 12.0 - 15.0 g/dL 8.5(L) 7.0(L) 7.1(L)  Hematocrit 36.0 - 46.0 % 27.6(L) 21.9(L) 22.4(L)  Platelets 150 - 400 K/uL 222 310 324    CMP Latest Ref Rng & Units 12/19/2021 04/01/2021 03/31/2021  Glucose 70 - 99 mg/dL 148(H) 132(H) 110(H)  BUN 8 - 23 mg/dL 34(H) 29(H) 23  Creatinine 0.44 - 1.00 mg/dL 3.56(H) 4.25(H) 3.66(H)  Sodium 135 - 145 mmol/L 132(L) 127(L) 129(L)  Potassium 3.5 - 5.1 mmol/L 3.6 3.0(L) 2.8(L)  Chloride 98 - 111 mmol/L 95(L) 93(L) 94(L)  CO2 22 - 32 mmol/L 26 25 25   Calcium 8.9 - 10.3 mg/dL 8.8(L) 8.2(L) 8.3(L)  Total Protein 6.5 - 8.1 g/dL 6.0(L) 5.4(L) -  Total Bilirubin 0.3 - 1.2 mg/dL <0.1(L) 0.2(L) -  Alkaline Phos 38 - 126 U/L 80 63 -  AST 15 - 41 U/L 16 14(L) -  ALT 0 - 44  U/L 8 11 -    No results for input(s): INR in the last 168 hours. _________________________________________________________ Radiologic studies:   ______________________________________________________ Other studies:  Video capsule study report will be scanned, findings noted above. _______________________________________________________ Assessment & Plan  Impression: Anemia chronic GI blood loss and chronic kidney disease Small bowel AVMs with bleeding, recurrent problem with recent admission at outside hospital requiring endoscopic control.  Current video capsule study shows bleeding in proximately the same area. End-stage renal disease on hemodialysis Congestive heart failure COPD Type 2 diabetes  Fortunately, she looks well, vital signs stable and hemoglobin similar to recent outpatient values referenced in my February 15 office note.  Nevertheless, she has active GI bleeding on a video capsule study done 2 days ago and has multiple serious medical comorbidities precluding outpatient management of this problem.   Plan: CBC and CMP in a.m.  She will need dialysis tomorrow, timing to be determined in conjunction with endoscopy schedule.  She needs a small bowel enteroscopy to attempt localization and endoscopic control of AVMs.  She has had this done before, however risks and benefits were again reviewed and she was agreeable.  The benefits and risks of the planned procedure were described in detail with the patient or (when appropriate) their health care proxy.  Risks were outlined as including, but not limited to, bleeding, infection, perforation, adverse medication reaction leading to cardiac or pulmonary decompensation, pancreatitis (if ERCP).  The limitation of incomplete mucosal visualization was also discussed.  No guarantees or warranties were given.  Patient at increased risk for cardiopulmonary complications of procedure due to medical comorbidities.  I hope we will be  able to do the enteroscopy tomorrow, but that will depend on patient's clinical status, endoscopy staffing and anesthesia availability, and the need for and timing of patient's hemodialysis.   Thank you for the courtesy of this consult.  Please contact me with any questions or concerns.  [name] Office: [phone]

## 2021-12-19 NOTE — H&P (View-Only) (Signed)
[facility] Consult Note   History Anna Carr MRN # [id number]  Date of Admission: 12/19/2021 Date of Consultation: 12/19/2021 Referring physician: Dr. Trilby Drummer Candace Gallus, MD Primary Care Provider: Raina Mina., MD Primary Gastroenterologist: Dr. Kyra Leyland [place], [place])   Reason for Consultation/Chief Complaint: Small bowel AVMs with active bleeding  HPI  This is a 81 year old woman known to our practice from inpatient management May 2022 and office visit with me on 12/10/2021. Clinical details of her recurrent GI bleeding are included in my extensive note from the recent visit.  There is a telephone note from earlier today regarding this patient's video capsule study with ongoing bleeding and recommendation for hospital admission.  She has had chronic black stool since being on iron, difficult for her to say whether there is been any active bleeding lately.  She has generalized fatigue and dyspnea as before.  Anna Carr underwent dialysis uneventfully yesterday and says she is due tomorrow.  She denies chest pain abdominal pain, dysphagia or vomiting.  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  Patient was sent to our office as an outpatient by Dr. Lyda Jester since that practice and facility does not perform small bowel video capsule study.  Video capsule study from 2 days ago was read earlier this morning and shows active bleeding in the proximal small bowel, approximately 5 minutes after the first duodenal image.  ROS:  Constitutional: Fatigue Respiratory:   Dyspnea   All other systems are negative except as noted above in the HPI  Past Medical History Past Medical History:  Diagnosis Date   Anemia, chronic disease 01/23/2017   Arteriovenous fistula, acquired ([place]) 11/02/2019   AVM (arteriovenous malformation)    C.  difficile colitis 03/30/2021   CHF with left ventricular diastolic dysfunction, NYHA class 1 ([place]) 02/02/2017   Cholelithiasis 03/30/2021   Colon polyp    COPD mixed type ([place])    ESRD (end stage renal disease) ([place]) 07/18/2019   History of blood transfusion    Hyperlipidemia    Hypertension    Hypervolemia 11/13/2019   Hypovolemia 10/27/2021   Insomnia    Major depressive disorder, recurrent ([place]) 12/22/2015   Malaise and fatigue 12/23/2015   Osteoarthritis    Osteopenia    PAC (premature atrial contraction) 10/04/2020   Pain, unspecified 12/06/2019   Pneumonia    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   SIADH (syndrome of inappropriate ADH production) ([place]) 03/29/2019   Type 2 diabetes mellitus, with long-term current use of insulin ([place]) 03/11/2019    Past Surgical History Past Surgical History:  Procedure Laterality Date   A/V FISTULAGRAM Left 02/17/2021   Procedure: A/V FISTULAGRAM;  Surgeon: Waynetta Sandy, MD;  Location: [facility];  Service: Cardiovascular;  Laterality: Left;   APPENDECTOMY     AV FISTULA PLACEMENT Left    AV FISTULA PLACEMENT Left 03/12/2021   Procedure: INSERTION OF LEFT UPPER ARM ARTERIOVENOUS (AV) GORE-TEX GRAFT;  Surgeon: Waynetta Sandy, MD;  Location: [facility];  Service: Vascular;  Laterality: Left;   COLONOSCOPY WITH PROPOFOL N/A 03/18/2021   Procedure: COLONOSCOPY WITH PROPOFOL;  Surgeon: Doran Stabler, MD;  Location: [facility];  Service: Gastroenterology;  Laterality: N/A;   CYSTECTOMY     ENTEROSCOPY N/A 03/21/2021   Procedure: ENTEROSCOPY;  Surgeon: Doran Stabler, MD;  Location: [facility];  Service: Gastroenterology;  Laterality: N/A;  ESOPHAGOGASTRODUODENOSCOPY (EGD) WITH PROPOFOL N/A 03/18/2021   Procedure: ESOPHAGOGASTRODUODENOSCOPY (EGD) WITH PROPOFOL;  Surgeon: Doran Stabler, MD;  Location: [place];  Service: Gastroenterology;  Laterality: N/A;   GIVENS CAPSULE STUDY N/A 03/19/2021   Procedure:  GIVENS CAPSULE STUDY;  Surgeon: Doran Stabler, MD;  Location: [place];  Service: Gastroenterology;  Laterality: N/A;   HOT HEMOSTASIS N/A 03/21/2021   Procedure: HOT HEMOSTASIS (ARGON PLASMA COAGULATION/BICAP);  Surgeon: Doran Stabler, MD;  Location: [place];  Service: Gastroenterology;  Laterality: N/A;   HYSTEROSCOPY     POLYPECTOMY  03/18/2021   Procedure: POLYPECTOMY;  Surgeon: Doran Stabler, MD;  Location: [facility] ENDOSCOPY;  Service: Gastroenterology;;    Family History Family History  Problem Relation Age of Onset   Diabetes Mother    Hypertension Mother    Diabetes Maternal Grandfather    Liver disease Other     Social History Social History   Socioeconomic History   Marital status: Widowed    Spouse name: Not on file   Number of children: 3   Years of education: Not on file   Highest education level: Not on file  Occupational History   Not on file  Tobacco Use   Smoking status: Former    Years: 10.00    Types: Cigarettes   Smokeless tobacco: Never   Tobacco comments:    quit in 1973  Vaping Use   Vaping Use: Never used  Substance and Sexual Activity   Alcohol use: Not Currently   Drug use: Never   Sexual activity: Not Currently  Other Topics Concern   Not on file  Social History Narrative   Not on file   Social Determinants of Health   Financial Resource Strain: Not on file  Food Insecurity: Not on file  Transportation Needs: Not on file  Physical Activity: Not on file  Stress: Not on file  Social Connections: Not on file    Allergies No Known Allergies  Outpatient Meds Home medications from the H+P and/or nursing med reconciliation reviewed. Admission history and physical by [facility] reviewed including active medications   _____________________________________________________________________ Objective   Exam:  Current vital signs  Patient Vitals for the past 8 hrs:  BP Temp Temp src Pulse Resp SpO2   12/19/21 2230 (!) 155/83 -- -- 74 (!) 25 100 %  12/19/21 2152 (!) 139/91 -- -- 94 20 99 %  12/19/21 2100 (!) 126/105 -- -- 68 17 100 %  12/19/21 2015 112/82 -- -- 67 15 100 %  12/19/21 1845 (!) 147/70 -- -- 76 15 99 %  12/19/21 1830 (!) 147/66 -- -- 80 11 100 %  12/19/21 1559 (!) 153/53 98.1 F (36.7 C) Oral 74 16 100 %   No intake or output data in the 24 hours ending 12/19/21 2248  Physical Exam:   General: this is a pleasant elderly patient in no acute distress.  She looks comfortable, sitting on the edge of her ED stretcher doing crossword puzzles. Eyes: sclera anicteric, no redness ENT: oral mucosa moist without lesions, no cervical or supraclavicular lymphadenopathy CV: RRR with a soft systolic murmur, no JVD, no peripheral edema.  Left upper extremity AV fistula with palpable thrill Resp: clear to auscultation bilaterally, normal RR and effort noted GI: soft, no tenderness, with active bowel sounds. No guarding or palpable organomegaly noted Skin; warm and dry, no rash or jaundice noted Neuro: awake, alert and oriented x 3. Normal gross motor function  and fluent speech.  Labs:  CBC Latest Ref Rng & Units 12/19/2021 04/01/2021 03/31/2021  WBC 4.0 - 10.5 K/uL 7.2 7.4 9.8  Hemoglobin 12.0 - 15.0 g/dL 8.5(L) 7.0(L) 7.1(L)  Hematocrit 36.0 - 46.0 % 27.6(L) 21.9(L) 22.4(L)  Platelets 150 - 400 K/uL 222 310 324    CMP Latest Ref Rng & Units 12/19/2021 04/01/2021 03/31/2021  Glucose 70 - 99 mg/dL 148(H) 132(H) 110(H)  BUN 8 - 23 mg/dL 34(H) 29(H) 23  Creatinine 0.44 - 1.00 mg/dL 3.56(H) 4.25(H) 3.66(H)  Sodium 135 - 145 mmol/L 132(L) 127(L) 129(L)  Potassium 3.5 - 5.1 mmol/L 3.6 3.0(L) 2.8(L)  Chloride 98 - 111 mmol/L 95(L) 93(L) 94(L)  CO2 22 - 32 mmol/L 26 25 25   Calcium 8.9 - 10.3 mg/dL 8.8(L) 8.2(L) 8.3(L)  Total Protein 6.5 - 8.1 g/dL 6.0(L) 5.4(L) -  Total Bilirubin 0.3 - 1.2 mg/dL <0.1(L) 0.2(L) -  Alkaline Phos 38 - 126 U/L 80 63 -  AST 15 - 41 U/L 16 14(L) -  ALT 0 - 44  U/L 8 11 -    No results for input(s): INR in the last 168 hours. _________________________________________________________ Radiologic studies:   ______________________________________________________ Other studies:  Video capsule study report will be scanned, findings noted above. _______________________________________________________ Assessment & Plan  Impression: Anemia chronic GI blood loss and chronic kidney disease Small bowel AVMs with bleeding, recurrent problem with recent admission at outside hospital requiring endoscopic control.  Current video capsule study shows bleeding in proximately the same area. End-stage renal disease on hemodialysis Congestive heart failure COPD Type 2 diabetes  Fortunately, she looks well, vital signs stable and hemoglobin similar to recent outpatient values referenced in my February 15 office note.  Nevertheless, she has active GI bleeding on a video capsule study done 2 days ago and has multiple serious medical comorbidities precluding outpatient management of this problem.   Plan: CBC and CMP in a.m.  She will need dialysis tomorrow, timing to be determined in conjunction with endoscopy schedule.  She needs a small bowel enteroscopy to attempt localization and endoscopic control of AVMs.  She has had this done before, however risks and benefits were again reviewed and she was agreeable.  The benefits and risks of the planned procedure were described in detail with the patient or (when appropriate) their health care proxy.  Risks were outlined as including, but not limited to, bleeding, infection, perforation, adverse medication reaction leading to cardiac or pulmonary decompensation, pancreatitis (if ERCP).  The limitation of incomplete mucosal visualization was also discussed.  No guarantees or warranties were given.  Patient at increased risk for cardiopulmonary complications of procedure due to medical comorbidities.  I hope we will be  able to do the enteroscopy tomorrow, but that will depend on patient's clinical status, endoscopy staffing and anesthesia availability, and the need for and timing of patient's hemodialysis.   Thank you for the courtesy of this consult.  Please contact me with any questions or concerns.  [name] Office: [phone]

## 2021-12-19 NOTE — ED Provider Notes (Addendum)
[facility] EMERGENCY DEPARTMENT Provider Note   CSN: [id number] Arrival date & time: 12/19/21  1553     History  Chief Complaint  Patient presents with   GI Bleeding    Anna Carr is a 81 y.o. female.  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  Patient swallowed a capsule on Wednesday and was found to have bleeding in her small intestine.  She has not had any dark or bloody stools.  Has not had any abdominal pain.  No emesis.  No fever.  Called by her doctor and told to come here for admission      Home Medications Prior to Admission medications   Medication Sig Start Date End Date Taking? Authorizing Provider  ascorbic acid (VITAMIN C) 1000 MG tablet Take 1,000 mg by mouth daily in the afternoon. 12/07/19   [provider]  Biotin 5000 MCG TABS Take 5,000 mcg by mouth daily in the afternoon.    [provider]  carboxymethylcellulose (REFRESH PLUS) 0.5 % SOLN Place 1 drop into both eyes 3 (three) times daily as needed (dry eyes).    [provider]  carvedilol (COREG) 6.25 MG tablet Take 6.25 mg by mouth 2 (two) times daily. 12/07/19   [provider]  Cyanocobalamin (VITAMIN B-12 PO) Take 1 capsule by mouth every evening. Unknown strength    [provider]  ferric citrate (AURYXIA) 1 GM 210 MG(Fe) tablet Take 210 mg by mouth 3 (three) times daily with meals.    [provider]  furosemide (LASIX) 80 MG tablet Take 80 mg by mouth daily. 04/03/21   [provider]  glimepiride (AMARYL) 4 MG tablet Take 4 mg by mouth 2 (two) times daily. 12/07/19   [provider]  Insulin Glargine-Lixisenatide (SOLIQUA) 100-33 UNT-MCG/ML SOPN Inject 12 Units into the skin in the morning. 03/23/21   Nita Sells, MD  Lactobacillus (PROBIOTIC ACIDOPHILUS PO) Take 1 capsule by mouth in the morning.    [provider]   lidocaine-prilocaine (EMLA) cream Apply 1 application topically as needed (prior to fisula access). 07/04/20   [provider]  Methoxy PEG-Epoetin Beta (MIRCERA IJ) Mircera 11/22/21 12/03/22  [provider]  Omega-3 Fatty Acids (FISH OIL) 1000 MG CAPS Take 1,000 mg by mouth in the morning and at bedtime.    [provider]  pantoprazole (PROTONIX) 40 MG tablet Take 40 mg by mouth daily.    [provider]  polyethylene glycol (MIRALAX / GLYCOLAX) 17 g packet Take 17 g by mouth daily as needed for moderate constipation.    [provider]  Vitamin E 400 units TABS Take 800 Units by mouth in the morning. 01/09/20   [provider]      Allergies    Patient has no known allergies.    Review of Systems   Review of Systems  All other systems reviewed and are negative.  Physical Exam Updated Vital Signs BP (!) 153/53 (BP Location: Right Arm)    Pulse 74    Temp 98.1 F (36.7 C) (Oral)    Resp 16    SpO2 100%  Physical Exam Vitals and nursing note reviewed.  Constitutional:      General: She is not in acute distress.    Appearance: Normal appearance. She is well-developed. She is not toxic-appearing.  HENT:     Head: Normocephalic and atraumatic.  Eyes:     General: Lids  are normal.     Conjunctiva/sclera: Conjunctivae normal.     Pupils: Pupils are equal, round, and reactive to light.  Neck:     Thyroid: No thyroid mass.     Trachea: No tracheal deviation.  Cardiovascular:     Rate and Rhythm: Normal rate and regular rhythm.     Heart sounds: Normal heart sounds. No murmur heard.   No gallop.  Pulmonary:     Effort: Pulmonary effort is normal. No respiratory distress.     Breath sounds: Normal breath sounds. No stridor. No decreased breath sounds, wheezing, rhonchi or rales.  Abdominal:     General: There is no distension.     Palpations: Abdomen is soft.     Tenderness: There is no abdominal tenderness. There is no rebound.   Musculoskeletal:        General: No tenderness. Normal range of motion.     Cervical back: Normal range of motion and neck supple.  Skin:    General: Skin is warm and dry.     Findings: No abrasion or rash.  Neurological:     Mental Status: She is alert and oriented to person, place, and time. Mental status is at baseline.     GCS: GCS eye subscore is 4. GCS verbal subscore is 5. GCS motor subscore is 6.     Cranial Nerves: No cranial nerve deficit.     Sensory: No sensory deficit.     Motor: Motor function is intact.  Psychiatric:        Attention and Perception: Attention normal.        Speech: Speech normal.        Behavior: Behavior normal.    ED Results / Procedures / Treatments   Labs (all labs ordered are listed, but only abnormal results are displayed) Labs Reviewed  CBC WITH DIFFERENTIAL/PLATELET - Abnormal; Notable for the following components:      Result Value   RBC 2.79 (*)    Hemoglobin 8.5 (*)    HCT 27.6 (*)    All other components within normal limits  COMPREHENSIVE METABOLIC PANEL - Abnormal; Notable for the following components:   Sodium 132 (*)    Chloride 95 (*)    Glucose, Bld 148 (*)    BUN 34 (*)    Creatinine, Ser 3.56 (*)    Calcium 8.8 (*)    Total Protein 6.0 (*)    Albumin 3.0 (*)    Total Bilirubin <0.1 (*)    GFR, Estimated 12 (*)    All other components within normal limits  RESP PANEL BY RT-PCR (FLU A&B, COVID) ARPGX2  POC OCCULT BLOOD, ED  TYPE AND SCREEN    EKG None  Radiology No results found.  Procedures Procedures    Medications Ordered in ED Medications  pantoprazole (PROTONIX) injection 40 mg (has no administration in time range)    ED Course/ Medical Decision Making/ A&P                           Medical Decision Making Risk Prescription drug management.   External records reviewed and visit done in front of patient's wife  Patient here for small bowel bleeding.  Has hemoglobin of 8.5 which is stable.   She is a dialysis patient and potassium is stable at 3.6.  Will discuss with gastroenterologist on-call and admit to the internal medicine service        Final Clinical Impression(s) /  ED Diagnoses Final diagnoses:  None    Rx / DC Orders ED Discharge Orders     None         Lacretia Leigh, MD 12/19/21 [place]    Lacretia Leigh, MD 12/19/21 [phone]

## 2021-12-19 NOTE — Telephone Encounter (Signed)
I received a vm from patient returning my call.   I called and spoke with patient regarding information below from Dr. Loletha Carrow. Pt states that she can't go to the ED until around 6 pm. She reports that she has dialysis tomorrow. I told pt that it is important that she goes to the [facility] ED today for evaluation and management of the active bleeding in her small bowel. She is aware that I tried to contact Dr. Crisoforo [place] office but they are closed today. Pt verbalized understanding of all information and had no concerns at the end of the call.

## 2021-12-19 NOTE — ED Provider Triage Note (Signed)
Emergency Medicine Provider Triage Evaluation Note  Anna Carr , a 81 y.o. female  was evaluated in triage.  Pt complains of GI bleed.  Patient reports that she was told to [facility] the [facility] for further evaluation after video capsule study showed active bleeding.  Patient denies any blood in stool, melena, abdominal pain.  Patient endorses fatigue however states that this has been present for multiple months.  Patient is not on any blood thinners.  Per chart review documentation from Dr. 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  Recommended to be hospitalized for further management due to her age, medical additions, and active bleeding.  Review of Systems  Positive: Fatigue Negative: See above  Physical Exam  BP (!) 153/53 (BP Location: Right Arm)    Pulse 74    Temp 98.1 F (36.7 C) (Oral)    Resp 16    SpO2 100%  Gen:   Awake, no distress   Resp:  Normal effort  MSK:   Moves extremities without difficulty  Other:  Abdomen soft, nondistended, nontender  Medical Decision Making  Medically screening exam initiated at 5:00 PM.  Appropriate orders placed.  Anna Carr was informed that the remainder of the evaluation will be completed by another provider, this initial triage assessment does not replace that evaluation, and the importance of remaining in the ED until their evaluation is complete.     Anna Carr, [place] 12/19/21 1701

## 2021-12-19 NOTE — ED Triage Notes (Addendum)
Pt here POV d/t possible GI bleed. Pt endorses fatigue. HD tues/th/sat. Denies SOB, CP, bloody stool. Sent by GI MD. Pt pale during triage.

## 2021-12-19 NOTE — H&P (Signed)
History and Physical   Anna Carr VEH:[id number] DOB: 10-04-1941 DOA: 12/19/2021  PCP: Raina Mina., MD   Patient coming from: Home  Chief Complaint: GI bleed  HPI: Anna Carr is a 81 y.o. female with medical history significant of diabetes, SIADH, pulmonary hypertension, paroxysmal atrial tachycardia, depression, ESRD on HD, hypertension, hyperlipidemia, diverticulosis, COPD, CHF, AVM, GI bleed, anemia presenting with known GI bleeding.  Patient sent in by gastroenterologist after capsule endoscopy performed recently showed bleeding in the small bowel.  Due to patient's ESRD and other comorbid conditions decision was made to work patient off inpatient.  Denies any current bloody stools nor abdominal pain.  Does report dark stools but states that these have been present since starting iron.  Concern for possible AVM as she has a history of this.  She states this is her first episode of GI bleeding.  Most recently she was admitted in January.  Last dialysis session was yesterday..  She denies fevers, chills, chest pain, shortness of breath, constipation, diarrhea, nausea, vomiting.  ED Course: Vital signs in the ED significant for blood pressure in the 836O to 294T systolic.  Lab work-up showed CMP with sodium 132 which is stable for her, chloride 95, BUN 24, creatinine 3.56 which is stable, glucose 148, calcium 8.8, protein 6, albumin 3.  CBC with hemoglobin stable at 8.5.  FOBT pending.  Respiratory panel for flu and COVID pending.  Patient was typed and screened in the ED.  Imaging performed.  Patient received IV PPI dose and GI was consulted by EDP.  Review of Systems: As per HPI otherwise all other systems reviewed and are negative.  Past Medical History:  Diagnosis Date   Anemia, chronic disease 01/23/2017   Arteriovenous fistula, acquired ([place]) 11/02/2019   AVM (arteriovenous malformation)    C. difficile colitis 03/30/2021   CHF with left ventricular diastolic  dysfunction, NYHA class 1 ([place]) 02/02/2017   Cholelithiasis 03/30/2021   Colon polyp    COPD mixed type ([place])    ESRD (end stage renal disease) ([place]) 07/18/2019   History of blood transfusion    Hyperlipidemia    Hypertension    Hypervolemia 11/13/2019   Hypovolemia 10/27/2021   Insomnia    Major depressive disorder, recurrent ([place]) 12/22/2015   Malaise and fatigue 12/23/2015   Osteoarthritis    Osteopenia    PAC (premature atrial contraction) 10/04/2020   Pain, unspecified 12/06/2019   Pneumonia    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   SIADH (syndrome of inappropriate ADH production) ([place]) 03/29/2019   Type 2 diabetes mellitus, with long-term current use of insulin ([place]) 03/11/2019    Past Surgical History:  Procedure Laterality Date   A/V FISTULAGRAM Left 02/17/2021   Procedure: A/V FISTULAGRAM;  Surgeon: Waynetta Sandy, MD;  Location: [facility];  Service: Cardiovascular;  Laterality: Left;   APPENDECTOMY     AV FISTULA PLACEMENT Left    AV FISTULA PLACEMENT Left 03/12/2021   Procedure: INSERTION OF LEFT UPPER ARM ARTERIOVENOUS (AV) GORE-TEX GRAFT;  Surgeon: Waynetta Sandy, MD;  Location: [facility];  Service: Vascular;  Laterality: Left;   COLONOSCOPY WITH PROPOFOL N/A 03/18/2021   Procedure: COLONOSCOPY WITH PROPOFOL;  Surgeon: Doran Stabler, MD;  Location: [facility];  Service: Gastroenterology;  Laterality: N/A;   CYSTECTOMY     ENTEROSCOPY N/A 03/21/2021   Procedure: ENTEROSCOPY;  Surgeon: Doran Stabler, MD;  Location: [facility];  Service: Gastroenterology;  Laterality: N/A;   ESOPHAGOGASTRODUODENOSCOPY (EGD)  WITH PROPOFOL N/A 03/18/2021   Procedure: ESOPHAGOGASTRODUODENOSCOPY (EGD) WITH PROPOFOL;  Surgeon: Doran Stabler, MD;  Location: [facility];  Service: Gastroenterology;  Laterality: N/A;   GIVENS CAPSULE STUDY N/A 03/19/2021   Procedure: GIVENS CAPSULE STUDY;  Surgeon: Doran Stabler, MD;  Location: [facility];   Service: Gastroenterology;  Laterality: N/A;   HOT HEMOSTASIS N/A 03/21/2021   Procedure: HOT HEMOSTASIS (ARGON PLASMA COAGULATION/BICAP);  Surgeon: Doran Stabler, MD;  Location: [facility];  Service: Gastroenterology;  Laterality: N/A;   HYSTEROSCOPY     POLYPECTOMY  03/18/2021   Procedure: POLYPECTOMY;  Surgeon: Doran Stabler, MD;  Location: [facility];  Service: Gastroenterology;;    Social History  reports that she has quit smoking. Her smoking use included cigarettes. She has never used smokeless tobacco. She reports that she does not currently use alcohol. She reports that she does not use drugs.  No Known Allergies  Family History  Problem Relation Age of Onset   Diabetes Mother    Hypertension Mother    Diabetes Maternal Grandfather    Liver disease Other   Reviewed on admission  Prior to Admission medications   Medication Sig Start Date End Date Taking? Authorizing Provider  ascorbic acid (VITAMIN C) 1000 MG tablet Take 1,000 mg by mouth daily in the afternoon. 12/07/19   [provider]  Biotin 5000 MCG TABS Take 5,000 mcg by mouth daily in the afternoon.    [provider]  carboxymethylcellulose (REFRESH PLUS) 0.5 % SOLN Place 1 drop into both eyes 3 (three) times daily as needed (dry eyes).    [provider]  carvedilol (COREG) 6.25 MG tablet Take 6.25 mg by mouth 2 (two) times daily. 12/07/19   [provider]  Cyanocobalamin (VITAMIN B-12 PO) Take 1 capsule by mouth every evening. Unknown strength    [provider]  ferric citrate (AURYXIA) 1 GM 210 MG(Fe) tablet Take 210 mg by mouth 3 (three) times daily with meals.    [provider]  furosemide (LASIX) 80 MG tablet Take 80 mg by mouth daily. 04/03/21   [provider]  glimepiride (AMARYL) 4 MG tablet Take 4 mg by mouth 2 (two) times daily. 12/07/19   [provider]  Insulin Glargine-Lixisenatide (SOLIQUA) 100-33 UNT-MCG/ML SOPN Inject  12 Units into the skin in the morning. 03/23/21   Nita Sells, MD  Lactobacillus (PROBIOTIC ACIDOPHILUS PO) Take 1 capsule by mouth in the morning.    [provider]  lidocaine-prilocaine (EMLA) cream Apply 1 application topically as needed (prior to fisula access). 07/04/20   [provider]  Methoxy PEG-Epoetin Beta (MIRCERA IJ) Mircera 11/22/21 12/03/22  [provider]  Omega-3 Fatty Acids (FISH OIL) 1000 MG CAPS Take 1,000 mg by mouth in the morning and at bedtime.    [provider]  pantoprazole (PROTONIX) 40 MG tablet Take 40 mg by mouth daily.    [provider]  polyethylene glycol (MIRALAX / GLYCOLAX) 17 g packet Take 17 g by mouth daily as needed for moderate constipation.    [provider]  Vitamin E 400 units TABS Take 800 Units by mouth in the morning. 01/09/20   [provider]    Physical Exam: Vitals:   12/19/21 1559 12/19/21 1830 12/19/21 1845  BP: (!) 153/53 (!) 147/66 (!) 147/70  Pulse: 74 80 76  Resp: 16 11 15   Temp: 98.1 F (36.7 C)    TempSrc: Oral  SpO2: 100% 100% 99%    Physical Exam Constitutional:      General: She is not in acute distress.    Appearance: Normal appearance.  HENT:     Head: Normocephalic and atraumatic.     Mouth/Throat:     Mouth: Mucous membranes are moist.     Pharynx: Oropharynx is clear.  Eyes:     Extraocular Movements: Extraocular movements intact.     Pupils: Pupils are equal, round, and reactive to light.  Cardiovascular:     Rate and Rhythm: Normal rate and regular rhythm.     Pulses: Normal pulses.     Heart sounds: Normal heart sounds.  Pulmonary:     Effort: Pulmonary effort is normal. No respiratory distress.     Breath sounds: Normal breath sounds.  Abdominal:     General: Bowel sounds are normal. There is no distension.     Palpations: Abdomen is soft.     Tenderness: There is no abdominal tenderness.  Musculoskeletal:        General: No  swelling or deformity.  Skin:    General: Skin is warm and dry.  Neurological:     General: No focal deficit present.     Mental Status: Mental status is at baseline.   Labs on Admission: I have personally reviewed following labs and imaging studies  CBC: Recent Labs  Lab 12/19/21 1701  WBC 7.2  NEUTROABS 4.6  HGB 8.5*  HCT 27.6*  MCV 98.9  PLT 149    Basic Metabolic Panel: Recent Labs  Lab 12/19/21 1701  NA 132*  K 3.6  CL 95*  CO2 26  GLUCOSE 148*  BUN 34*  CREATININE 3.56*  CALCIUM 8.8*    GFR: Estimated Creatinine Clearance: 11.9 mL/min (A) (by C-G formula based on SCr of 3.56 mg/dL (H)).  Liver Function Tests: Recent Labs  Lab 12/19/21 1701  AST 16  ALT 8  ALKPHOS 80  BILITOT <0.1*  PROT 6.0*  ALBUMIN 3.0*    Urine analysis: No results found for: COLORURINE, APPEARANCEUR, LABSPEC, PHURINE, GLUCOSEU, HGBUR, BILIRUBINUR, KETONESUR, PROTEINUR, UROBILINOGEN, NITRITE, LEUKOCYTESUR  Radiological Exams on Admission: No results found.  EKG: Not performed in the ED  Assessment/Plan Principal Problem:   GI bleed Active Problems:   Anemia in chronic kidney disease   CHF with left ventricular diastolic dysfunction, NYHA class 1 ([name])   HLD (hyperlipidemia)   Hyponatremia   Type 2 diabetes mellitus with chronic kidney disease on chronic dialysis, with long-term current use of insulin ([name])   ESRD on hemodialysis ([name])   COPD mixed type ([name])   Hypertension   SIADH (syndrome of inappropriate ADH production) ([place])   GI bleed > Patient presenting with known GI bleed as she is being worked up outpatient by GI.  Pill endoscopy done and showed small bowel bleed with suspicion of AVM as she has a history of this. > Sent to the ED for inpatient evaluation considering her comorbid conditions including ESRD. > Hemoglobin stable not currently reporting any dark or bloody stools nor abdominal pain. > Given a dose of IV PPI in the ED.  GI consulted by EDP as  well. - Monitor on telemetry - Proceed GI recommendations - N.p.o. at midnight - Trend hemoglobin  ESRD on HD > HD on Tuesday Thursday Saturday, last HD on Thursday. - Will need nephrology consult for HD in the morning - Avoid nephrotoxic agents - Trend renal function and electrolytes - If needs blood transfusion this may  need to be done during dialysis  Hyponatremia > Stable hyponatremia 1 3280. > Does have history of SIADH and chart - Continue to trend  Diabetes > On 16 units of insulin outpatient - 5 units long-acting daily - SSI  Diastolic heart failure > Last echo in our system was June 2022 with EF 50-65% and grade 3 diastolic dysfunction, normal RV function. - Volume management with dialysis - Continue home carvedilol  Hypertension - Continue home carvedilol  Hyperlipidemia - Not currently on medications for this  COPD - Not currently on any medications for this   DVT prophylaxis: SCDs Code Status:   Full Family Communication:  Family updated at bedside Disposition Plan:   Patient is from:  Home  Anticipated DC to:  Home  Anticipated DC date:  1 to 3 days  Anticipated DC barriers: None  Consults called:  GI consulted by EDP Admission status:  Observation, telemetry  Severity of Illness: The appropriate patient status for this patient is OBSERVATION. Observation status is judged to be reasonable and necessary in order to provide the required intensity of service to ensure the patient's safety. The patient's presenting symptoms, physical exam findings, and initial radiographic and laboratory data in the context of their medical condition is felt to place them at decreased risk for further clinical deterioration. Furthermore, it is anticipated that the patient will be medically stable for discharge from the hospital within 2 midnights of admission.    Marcelyn Bruins MD [facility]  How to contact the [facility] Attending or Consulting provider [place] or  covering provider during after hours [place], for this patient?   Check the care team in [facility] and look for a) attending/consulting TR[facility] provider listed and b) the [facility] team listed Log into [email or web address] and use [place]'s universal password to access. If you do not have the password, please contact the hospital operator. Locate the [facility] provider you are looking for under [facility] and page to a number that you can be directly reached. If you still have difficulty reaching the provider, please page the [facility] (Director on Call) for the Hospitalists listed on amion for assistance.  12/19/2021, 7:41 PM

## 2021-12-19 NOTE — Telephone Encounter (Signed)
[place],  This is a patient of Dr. Tarri Glenn that I recently saw in the office after a referral from Dr. Lyda Jester (GI) in [place].  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 There was a recent hospitalization in the [facility] system for the same problem.  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  Given her age, medical conditions and active bleeding seen on the study, I recommend she be hospitalized for further management.  (If the patient cannot be reached initially, please try her son.  I do not recall which days of the week Dawnetta is on dialysis).  This patient lives in [place], [place] ([place] and has both primary care and primary GI care in [place].  Please contact Dr. Crisoforo Oxford office first thing this morning and ask their clinical staff to speak with Dr. Lyda Jester and find out if they feel this can be managed at their hospital over the weekend.  If they do not have sufficient GI, medicine or nephrology coverage, then this patient should be directed to the [facility] emergency department today.  Contact Ms. Kinley after you get an answer from Dr. Crisoforo Oxford office this morning.  - HD _____________________  Bertram Millard on your patient -please see the office note I routed to you on 12/10/2021   __________________  Eliseo Gum in case this patient arrives at [facility] today    - H

## 2021-12-20 ENCOUNTER — Encounter (HOSPITAL_COMMUNITY)
Admission: EM | Disposition: A | Discharge status: Home / Self Care | Payer: Self-pay | Source: Home / Self Care | Attending: Emergency Medicine

## 2021-12-20 ENCOUNTER — Observation Stay (HOSPITAL_BASED_OUTPATIENT_CLINIC_OR_DEPARTMENT_OTHER): Payer: Medicare Other | Admitting: Certified Registered Nurse Anesthetist

## 2021-12-20 ENCOUNTER — Observation Stay (HOSPITAL_COMMUNITY): Payer: Medicare Other | Admitting: Certified Registered Nurse Anesthetist

## 2021-12-20 DIAGNOSIS — K31811 Angiodysplasia of stomach and duodenum with bleeding: Secondary | ICD-10-CM

## 2021-12-20 DIAGNOSIS — E1122 Type 2 diabetes mellitus with diabetic chronic kidney disease: Secondary | ICD-10-CM | POA: Diagnosis not present

## 2021-12-20 DIAGNOSIS — K5521 Angiodysplasia of colon with hemorrhage: Secondary | ICD-10-CM | POA: Diagnosis not present

## 2021-12-20 DIAGNOSIS — D500 Iron deficiency anemia secondary to blood loss (chronic): Secondary | ICD-10-CM | POA: Diagnosis not present

## 2021-12-20 DIAGNOSIS — I132 Hypertensive heart and chronic kidney disease with heart failure and with stage 5 chronic kidney disease, or end stage renal disease: Secondary | ICD-10-CM

## 2021-12-20 DIAGNOSIS — E785 Hyperlipidemia, unspecified: Secondary | ICD-10-CM

## 2021-12-20 DIAGNOSIS — E1169 Type 2 diabetes mellitus with other specified complication: Secondary | ICD-10-CM | POA: Diagnosis not present

## 2021-12-20 DIAGNOSIS — I5030 Unspecified diastolic (congestive) heart failure: Secondary | ICD-10-CM | POA: Diagnosis not present

## 2021-12-20 DIAGNOSIS — I509 Heart failure, unspecified: Secondary | ICD-10-CM | POA: Diagnosis not present

## 2021-12-20 DIAGNOSIS — N186 End stage renal disease: Secondary | ICD-10-CM

## 2021-12-20 HISTORY — PX: HOT HEMOSTASIS: SHX5433

## 2021-12-20 HISTORY — PX: ENTEROSCOPY: SHX5533

## 2021-12-20 HISTORY — PX: HEMOSTASIS CLIP PLACEMENT: SHX6857

## 2021-12-20 LAB — HEPATITIS B SURFACE ANTIGEN: Hepatitis B Surface Ag: NONREACTIVE

## 2021-12-20 LAB — GLUCOSE, CAPILLARY
Glucose-Capillary: 157 mg/dL — ABNORMAL HIGH (ref 70–99)
Glucose-Capillary: 179 mg/dL — ABNORMAL HIGH (ref 70–99)
Glucose-Capillary: 187 mg/dL — ABNORMAL HIGH (ref 70–99)
Glucose-Capillary: 263 mg/dL — ABNORMAL HIGH (ref 70–99)
Glucose-Capillary: 87 mg/dL (ref 70–99)

## 2021-12-20 LAB — CBC
HCT: 26.6 % — ABNORMAL LOW (ref 36.0–46.0)
Hemoglobin: 8.4 g/dL — ABNORMAL LOW (ref 12.0–15.0)
MCH: 30.5 pg (ref 26.0–34.0)
MCHC: 31.6 g/dL (ref 30.0–36.0)
MCV: 96.7 fL (ref 80.0–100.0)
Platelets: 217 10*3/uL (ref 150–400)
RBC: 2.75 MIL/uL — ABNORMAL LOW (ref 3.87–5.11)
RDW: 15 % (ref 11.5–15.5)
WBC: 7.2 10*3/uL (ref 4.0–10.5)
nRBC: 0 % (ref 0.0–0.2)

## 2021-12-20 LAB — COMPREHENSIVE METABOLIC PANEL
ALT: 8 U/L (ref 0–44)
AST: 14 U/L — ABNORMAL LOW (ref 15–41)
Albumin: 2.9 g/dL — ABNORMAL LOW (ref 3.5–5.0)
Alkaline Phosphatase: 72 U/L (ref 38–126)
Anion gap: 11 (ref 5–15)
BUN: 36 mg/dL — ABNORMAL HIGH (ref 8–23)
CO2: 25 mmol/L (ref 22–32)
Calcium: 9 mg/dL (ref 8.9–10.3)
Chloride: 99 mmol/L (ref 98–111)
Creatinine, Ser: 3.8 mg/dL — ABNORMAL HIGH (ref 0.44–1.00)
GFR, Estimated: 12 mL/min — ABNORMAL LOW (ref >60)
Glucose, Bld: 133 mg/dL — ABNORMAL HIGH (ref 70–99)
Potassium: 3.4 mmol/L — ABNORMAL LOW (ref 3.5–5.1)
Sodium: 135 mmol/L (ref 135–145)
Total Bilirubin: 0.2 mg/dL — ABNORMAL LOW (ref 0.3–1.2)
Total Protein: 5.8 g/dL — ABNORMAL LOW (ref 6.5–8.1)

## 2021-12-20 LAB — HEPATITIS B SURFACE ANTIBODY,QUALITATIVE: Hep B S Ab: REACTIVE — AB

## 2021-12-20 LAB — PHOSPHORUS: Phosphorus: 4 mg/dL (ref 2.5–4.6)

## 2021-12-20 SURGERY — ENTEROSCOPY
Anesthesia: Monitor Anesthesia Care

## 2021-12-20 MED ORDER — GLUCAGON HCL RDNA (DIAGNOSTIC) 1 MG IJ SOLR
INTRAMUSCULAR | Status: DC | PRN
  Administered 2021-12-20: .5 mg via INTRAVENOUS

## 2021-12-20 MED ORDER — PANTOPRAZOLE SODIUM 40 MG PO TBEC
40.0000 mg | DELAYED_RELEASE_TABLET | Freq: Every day | ORAL | Status: DC
Start: 2021-12-20 — End: 2021-12-21
  Administered 2021-12-20 – 2021-12-21 (×2): 40 mg via ORAL
  Filled 2021-12-20 (×2): qty 1

## 2021-12-20 MED ORDER — CHLORHEXIDINE GLUCONATE CLOTH 2 % EX PADS
6.0000 | MEDICATED_PAD | Freq: Every day | CUTANEOUS | Status: DC
  Administered 2021-12-20: 6 via TOPICAL

## 2021-12-20 MED ORDER — LIDOCAINE 2% (20 MG/ML) 5 ML SYRINGE
INTRAMUSCULAR | Status: DC | PRN
  Administered 2021-12-20 (×2): 40 mg via INTRAVENOUS
  Administered 2021-12-20 (×2): 80 mg via INTRAVENOUS
  Administered 2021-12-20: 40 mg via INTRAVENOUS

## 2021-12-20 MED ORDER — PROPOFOL 10 MG/ML IV BOLUS
INTRAVENOUS | Status: DC | PRN
  Administered 2021-12-20: 20 mg via INTRAVENOUS
  Administered 2021-12-20: 10 mg via INTRAVENOUS

## 2021-12-20 MED ORDER — PHENYLEPHRINE 40 MCG/ML (10ML) SYRINGE FOR IV PUSH (FOR BLOOD PRESSURE SUPPORT)
PREFILLED_SYRINGE | INTRAVENOUS | Status: DC | PRN
  Administered 2021-12-20: 40 ug via INTRAVENOUS

## 2021-12-20 MED ORDER — PROPOFOL 500 MG/50ML IV EMUL
INTRAVENOUS | Status: DC | PRN
  Administered 2021-12-20: 100 ug/kg/min via INTRAVENOUS

## 2021-12-20 MED ORDER — SODIUM CHLORIDE 0.9 % IV SOLN
62.5000 mg | Freq: Once | INTRAVENOUS | Status: DC
  Filled 2021-12-20: qty 5

## 2021-12-20 NOTE — TOC Progression Note (Signed)
Transition of Care [facility]) - Progression Note    Patient Details  Name: JAYLI FOGLEMAN MRN: [id number] Date of Birth: [birth year]  Transition of Care [facility]) CM/SW Contact  Zenon Mayo, RN Phone Number: 12/20/2021, 1:29 PM  Clinical Narrative:     Transition of Care [facility]) Screening Note   Patient Details  Name: MINETTE MANDERS Date of Birth: [birth year]   Transition of Care [facility]) CM/SW Contact:    Zenon Mayo, RN Phone Number: 12/20/2021, 1:29 PM    Transition of Care Department [facility]) has reviewed patient and no TOC needs have been identified at this time. We will continue to monitor patient advancement through interdisciplinary progression rounds. If new patient transition needs arise, please place a TOC consult.          Expected Discharge Plan and Services                                                 Social Determinants of Health (SDOH) Interventions    Readmission Risk Interventions No flowsheet data found.

## 2021-12-20 NOTE — Hospital Course (Addendum)
Anna Carr was admitted to the hospital with the working diagnosis of acute blood loss anemia due to upper GI bleed.   81 yo female with the past medical history of T2DM, SIADH, pulmonary hypertension, paroxysmal atrial fibrillation, hypertension, heart failure, COPD, AVM, upper GI bleed and ESRD who presented with dark stools. She was referred to the hospital by gastroenterology due to suspected active upper GI bleed per capsule endoscopy. On her initial physical examination her blood pressure was 153/53, HR 74, RR 16 and oxygen saturation 100% on room air, her lungs were clear to auscultation, heart with S1 and S2 present with no gallops, abdomen soft and no lower extremity edema.   Na 132, K 3,6, Cl 95, bicarbonate 26, glucose 148, bun 34 and cr 3,56  Wbc 7,2, hgb 8,5, hct 27,6 plt 222 Sars covid 19 negative   Patient  was placed on IV proton pump inhibitors and consulted Gi for endoscopic evaluation, for localization and control of bleeding AVM.   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  Nephrology was consulted and patient underwent renal replacement therapy per her schedule.   Her hgb has remained stable, she will be discharged home with outpatient follow up.

## 2021-12-20 NOTE — Assessment & Plan Note (Addendum)
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   Patient was admitted to the medical ward and she was placed on a remote telemetry monitoring.  Received proton pump inhibitors with good toleration.   Further work up with small bowel enteroscopy showed a with single angioectasia in the jejunum. Treated with argon plasma coagulation.  Patient tolerated procedure well, her hgb remained stable, at discharge 8,5 with hct 25.6   Plan to follow up as outpatient, will need weekly complete blood count on hemodialysis  His primary gastroenterology is in [place], Dr. Lyda Jester.

## 2021-12-20 NOTE — Assessment & Plan Note (Addendum)
Hypokalemia  Na is 135, with K at 3,4 and serum bicarbonate at 25 Continue renal replacement therapy per her home schedule.

## 2021-12-20 NOTE — Assessment & Plan Note (Addendum)
Nephrology was consulted and patient underwent renal replacement therapy per her schedule with good toleration.

## 2021-12-20 NOTE — Anesthesia Postprocedure Evaluation (Signed)
Anesthesia Post Note  Patient: Anna Carr  Procedure(s) Performed: ENTEROSCOPY HOT HEMOSTASIS (ARGON PLASMA COAGULATION/BICAP) HEMOSTASIS CLIP PLACEMENT     Patient location during evaluation: Endoscopy Anesthesia Type: MAC Level of consciousness: awake and alert Pain management: pain level controlled Vital Signs Assessment: post-procedure vital signs reviewed and stable Respiratory status: spontaneous breathing, nonlabored ventilation, respiratory function stable and patient connected to nasal cannula oxygen Cardiovascular status: stable and blood pressure returned to baseline Postop Assessment: no apparent nausea or vomiting Anesthetic complications: no   No notable events documented.  Last Vitals:  Vitals:   12/20/21 1305 12/20/21 1613  BP: 99/63 126/85  Pulse:  69  Resp:    Temp:  36.8 C  SpO2:  99%    Last Pain:  Vitals:   12/20/21 1613  TempSrc: Oral  PainSc: 0-No pain                 Belenda Cruise P Dailon Sheeran

## 2021-12-20 NOTE — Interval H&P Note (Signed)
History and Physical Interval Note:  12/20/2021 11:01 AM  Anna Carr  has presented today for surgery, with the diagnosis of Small intestine AVM with bleeding.  The various methods of treatment have been discussed with the patient and family. After consideration of risks, benefits and other options for treatment, the patient has consented to  Procedure(s): ENTEROSCOPY (N/A) as a surgical intervention.  The patient's history has been reviewed, patient examined, no change in status, stable for surgery.  I have reviewed the patient's chart and labs.  Questions were answered to the patient's satisfaction.    Patient clinically stable overnight.  She was interviewed and examined in the endoscopy preprocedure area.  Hemoglobin 8.4, potassium 3.4, bicarb 25.  Proceed with small bowel enteroscopy for attempted localization and endoscopic therapy of bleeding AVMs. She will go to dialysis later today. [name]

## 2021-12-20 NOTE — Anesthesia Preprocedure Evaluation (Addendum)
Anesthesia Evaluation  Patient identified by MRN, date of birth, ID band Patient awake    Reviewed: Allergy & Precautions, NPO status , Patient's Chart, lab work & pertinent test results  Airway Mallampati: II  TM Distance: >3 FB Neck ROM: Full    Dental no notable dental hx.    Pulmonary COPD, former smoker,    Pulmonary exam normal        Cardiovascular hypertension, Pt. on medications and Pt. on home beta blockers +CHF   Rhythm:Regular Rate:Normal     Neuro/Psych Depression negative neurological ROS     GI/Hepatic Neg liver ROS, GERD  Medicated,AVM with bleeding   Endo/Other  diabetes, Type 2, Insulin Dependent  Renal/GU ESRFRenal disease  negative genitourinary   Musculoskeletal  (+) Arthritis , Osteoarthritis,    Abdominal Normal abdominal exam  (+)   Peds  Hematology  (+) Blood dyscrasia, anemia ,   Anesthesia Other Findings   Reproductive/Obstetrics                            Anesthesia Physical Anesthesia Plan  ASA: 3  Anesthesia Plan: MAC   Post-op Pain Management:    Induction: Intravenous  PONV Risk Score and Plan: 2 and Propofol infusion and Treatment may vary due to age or medical condition  Airway Management Planned: Simple Face Mask, Natural Airway and Nasal Cannula  Additional Equipment: None  Intra-op Plan:   Post-operative Plan:   Informed Consent: I have reviewed the patients History and Physical, chart, labs and discussed the procedure including the risks, benefits and alternatives for the proposed anesthesia with the patient or authorized representative who has indicated his/her understanding and acceptance.     Dental advisory given  Plan Discussed with: CRNA  Anesthesia Plan Comments: (Lab Results      Component                Value               Date                      WBC                      7.2                 12/20/2021                 HGB                      8.4 (L)             12/20/2021                HCT                      26.6 (L)            12/20/2021                MCV                      96.7                12/20/2021                PLT  217                 12/20/2021           Lab Results      Component                Value               Date                      NA                       135                 12/20/2021                K                        3.4 (L)             12/20/2021                CO2                      25                  12/20/2021                GLUCOSE                  133 (H)             12/20/2021                BUN                      36 (H)              12/20/2021                CREATININE               3.80 (H)            12/20/2021                CALCIUM                  9.0                 12/20/2021                GFRNONAA                 12 (L)              12/20/2021           ECHO 06/22: 1. Left ventricular ejection fraction, by estimation, is 55 to 60%. The  left ventricle has normal function. The left ventricle has no regional  wall motion abnormalities. There is moderate concentric left ventricular  hypertrophy. Left ventricular  diastolic parameters are consistent with Grade III diastolic dysfunction  (restrictive).  2. Right ventricular systolic function is normal. The right ventricular  size is normal. There is mildly elevated pulmonary artery systolic  pressure.  3. Left atrial size was severely dilated.  4. The mitral valve is abnormal. Mild to moderate mitral valve  regurgitation. Mild mitral stenosis. Severe mitral annular calcification.  5.  The aortic valve is tricuspid. There is moderate calcification of the  aortic valve. There is moderate thickening of the aortic valve. Aortic  valve regurgitation is not visualized. Mild to moderate aortic valve  sclerosis/calcification is present,  without any evidence of aortic stenosis.   6. The inferior vena cava is normal in size with greater than 50%  respiratory variability, suggesting right atrial pressure of 3 mmHg.   Comparison(s): Prior images unable to be directly viewed, comparison made  by report only. No significant change from prior study. )        Anesthesia Quick Evaluation

## 2021-12-20 NOTE — Assessment & Plan Note (Addendum)
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  Patient received IV iron per her home regimen.  Iron stores shoed serum iron of 44, tibc 231, transferrin saturation 19, ferritin 1,223 and transferrin 165. Continue oral iron supplementation and follow up as outpatient.

## 2021-12-20 NOTE — Assessment & Plan Note (Addendum)
Her glucose remained stable, at her discharge she will continue with basal insulin and oral hypoglycemic agent with gimeperide.

## 2021-12-20 NOTE — Assessment & Plan Note (Addendum)
On carvedilol for blood pressure control.

## 2021-12-20 NOTE — Progress Notes (Signed)
Progress Note   Patient: Anna Carr ION:[id number] DOB: 1941-08-25 DOA: 12/19/2021     0 DOS: the patient was seen and examined on 12/20/2021   Brief hospital course: Anna Carr was admitted to the hospital with the working diagnosis of acute blood loss anemia due to upper GI bleed.   81 yo female with the past medical history of T2DM, SIADH, pulmonary hypertension, paroxysmal atrial fibrillation, hypertension, heart failure, COPD, AVM, upper GI bleed and ESRD who presented with dark stools. She was referred to the hospital by gastroenterology due to suspected active upper GI bleed per capsule endoscopy. On her initial physical examination her blood pressure was 153/53, HR 74, RR 16 and oxygen saturation 100% on room air, her lungs were clear to auscultation, heart with S1 and S2 present with no gallops, abdomen soft and no lower extremity edema.   Na 132, K 3,6, Cl 95, bicarbonate 26, glucose 148, bun 34 and cr 3,56  Wbc 7,2, hgb 8,5, hct 27,6 plt 222 Sars covid 19 negative   Patient has been placed on IV proton pump inhibitors and consulted Gi for endoscopic evaluation, for localization and control of bleeding AVM.     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   Patient with no abdominal pain, no nausea or vomiting. 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 Treated with argon plasma coagulation. Follow up hbg is 8,4 and hct at 36   Plan to continue antiacid therapy and follow up cell count in am. If stable plan to discharge home tomorrow morning.   Anemia due to chronic blood loss- (present on admission) Combined with anemia of chronic renal disease.  hgb is 8,4 and hct at 26.6 Continue cell count monitoring tomorrow and then as outpatient with hemodialysis.  Will check iron panel tomorrow.   CHF with left ventricular  diastolic dysfunction, NYHA class 1 (Anna Carr)- (present on admission) No clinical signs of exacerbation, no signs of volume overload Plan to continue ultrafiltration to keep fluid balance.  Continue blood pressure monitoring.  On carvedilol.   Type 2 diabetes mellitus with hyperlipidemia ([name]) Continue glucose cover and monitoring with insulin sliding scale. Continue basal insulin with 5 units daily.   Continue with statin therapy.   ESRD on hemodialysis Anna Carr) Patient scheduled for renal replacement therapy today per nephrology recommendations.   COPD mixed type (Anna Carr)- (present on admission) No signs of exacerbation.  Plan to follow up as outpatient   Hypertension- (present on admission) Continue blood pressure control with carvedilol.   SIADH (syndrome of inappropriate ADH production) (Anna Carr)- (present on admission) Hypokalemia  Na is 135, with K at 3,4 and serum bicarbonate at 25 For renal replacement therapy today.         Subjective: Patient with no nausea or vomiting, no chest pain or dyspnea,   Physical Exam: Vitals:   12/20/21 1221 12/20/21 1230 12/20/21 1242 12/20/21 1305  BP: (!) 94/43 (!) 100/38 (!) 91/42 99/63  Pulse: 62 (!) 58 72   Resp: 17 17 20    Temp:  97.7 F (36.5 C) 97.6 F (36.4 C)   TempSrc:   Oral   SpO2: 95% 97% 98%   Weight:      Height:       Neurology with awake and alert ENT with mild pallor but no icterus Cardiovascular with S1 and S2 present and rhythmic with no  gallops or murmurs No JVD No lower extremity edema Respiratory with no wheezing Abdomen soft   Data Reviewed:    Family Communication: no family at the bedside   Disposition: Status is: Observation The patient remains OBS appropriate and will d/c before 2 midnights.        Planned Discharge Destination: Home     Author: Tawni Millers, MD 12/20/2021 3:54 PM  For on call review [email or web address].

## 2021-12-20 NOTE — Consult Note (Signed)
Renal Service Consult Note [facility]  Anna Carr 12/20/2021 Sol Blazing, MD Requesting Physician: Dr Cathlean Sauer  Reason for Consult: ESRD pt w/ GI bleed HPI: The patient is a 81 y.o. year-old w/ hx of DM, SIADH, pHTN, PAT, depression, ESRD on HD, HTN, HL, COPD, GI bleed w/ AVM's who presented to ED for fatigue and anemia w/ recent OP GI work-up showing possible AVM's. In ED BP's stable and Hb 8.5, creat 3.5.  IV PPI and GI consulted. We are asked to see for ESRD.   Pt on HD for about 2 years, lives w/ her son and daughter , cares for herself. No tob/ etoh. No abd pain or diarrhea. No SOB, cough or CP.   ROS - denies CP, no joint pain, no HA, no blurry vision, no rash, no diarrhea, no nausea/ vomiting, no dysuria, no difficulty voiding   Past Medical History  Past Medical History:  Diagnosis Date   Anemia, chronic disease 01/23/2017   Arteriovenous fistula, acquired ([place]) 11/02/2019   AVM (arteriovenous malformation)    C. difficile colitis 03/30/2021   CHF with left ventricular diastolic dysfunction, NYHA class 1 ([place]) 02/02/2017   Cholelithiasis 03/30/2021   Colon polyp    COPD mixed type ([place])    ESRD (end stage renal disease) ([place]) 07/18/2019   History of blood transfusion    Hyperlipidemia    Hypertension    Hypervolemia 11/13/2019   Hypovolemia 10/27/2021   Insomnia    Major depressive disorder, recurrent ([place]) 12/22/2015   Malaise and fatigue 12/23/2015   Osteoarthritis    Osteopenia    PAC (premature atrial contraction) 10/04/2020   Pain, unspecified 12/06/2019   Pneumonia    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   SIADH (syndrome of inappropriate ADH production) ([place]) 03/29/2019   Type 2 diabetes mellitus, with long-term current use of insulin ([place]) 03/11/2019   Past Surgical History  Past Surgical History:  Procedure Laterality Date   A/V FISTULAGRAM Left 02/17/2021   Procedure: A/V FISTULAGRAM;  Surgeon: Waynetta Sandy, MD;  Location:  [facility];  Service: Cardiovascular;  Laterality: Left;   APPENDECTOMY     AV FISTULA PLACEMENT Left    AV FISTULA PLACEMENT Left 03/12/2021   Procedure: INSERTION OF LEFT UPPER ARM ARTERIOVENOUS (AV) GORE-TEX GRAFT;  Surgeon: Waynetta Sandy, MD;  Location: [facility];  Service: Vascular;  Laterality: Left;   COLONOSCOPY WITH PROPOFOL N/A 03/18/2021   Procedure: COLONOSCOPY WITH PROPOFOL;  Surgeon: Doran Stabler, MD;  Location: [place];  Service: Gastroenterology;  Laterality: N/A;   CYSTECTOMY     ENTEROSCOPY N/A 03/21/2021   Procedure: ENTEROSCOPY;  Surgeon: Doran Stabler, MD;  Location: [place];  Service: Gastroenterology;  Laterality: N/A;   ESOPHAGOGASTRODUODENOSCOPY (EGD) WITH PROPOFOL N/A 03/18/2021   Procedure: ESOPHAGOGASTRODUODENOSCOPY (EGD) WITH PROPOFOL;  Surgeon: Doran Stabler, MD;  Location: [place];  Service: Gastroenterology;  Laterality: N/A;   GIVENS CAPSULE STUDY N/A 03/19/2021   Procedure: GIVENS CAPSULE STUDY;  Surgeon: Doran Stabler, MD;  Location: [facility];  Service: Gastroenterology;  Laterality: N/A;   HOT HEMOSTASIS N/A 03/21/2021   Procedure: HOT HEMOSTASIS (ARGON PLASMA COAGULATION/BICAP);  Surgeon: Doran Stabler, MD;  Location: [place];  Service: Gastroenterology;  Laterality: N/A;   HYSTEROSCOPY     POLYPECTOMY  03/18/2021   Procedure: POLYPECTOMY;  Surgeon: Doran Stabler, MD;  Location: [facility] END[facility]COPY;  Service: Gastroenterology;;   Family History  Family History  Problem Relation Age of Onset   Diabetes Mother    Hypertension Mother    Diabetes Maternal Grandfather    Liver disease Other    Social History  reports that she has quit smoking. Her smoking use included cigarettes. She has never used smokeless tobacco. She reports that she does not currently use alcohol. She reports that she does not use drugs. Allergies No Known Allergies Home medications Prior to Admission medications    Medication Sig Start Date End Date Taking? Authorizing Provider  ascorbic acid (VITAMIN C) 1000 MG tablet Take 1,000 mg by mouth daily in the afternoon. 12/07/19  Yes [provider]  Biotin 5000 MCG TABS Take 5,000 mcg by mouth daily in the afternoon.   Yes [provider]  carboxymethylcellulose (REFRESH PLUS) 0.5 % SOLN Place 1 drop into both eyes 3 (three) times daily as needed (dry eyes).   Yes [provider]  carvedilol (COREG) 6.25 MG tablet Take 6.25 mg by mouth 2 (two) times daily. 12/07/19  Yes [provider]  Cyanocobalamin (VITAMIN B-12 PO) Take 1 capsule by mouth every evening. Unknown strength   Yes [provider]  ferric citrate (AURYXIA) 1 GM 210 MG(Fe) tablet Take 210 mg by mouth 3 (three) times daily with meals.   Yes [provider]  furosemide (LASIX) 80 MG tablet Take 80 mg by mouth daily. 04/03/21  Yes [provider]  glimepiride (AMARYL) 4 MG tablet Take 4 mg by mouth 2 (two) times daily. 12/07/19  Yes [provider]  Insulin Glargine-Lixisenatide (SOLIQUA) 100-33 UNT-MCG/ML SOPN Inject 12 Units into the skin in the morning. Patient taking differently: Inject 16 Units into the skin in the morning. 03/23/21  Yes Nita Sells, MD  Lactobacillus (PROBIOTIC ACIDOPHILUS PO) Take 1 capsule by mouth in the morning.   Yes [provider]  lidocaine-prilocaine (EMLA) cream Apply 1 application topically as needed (prior to fisula access). Tuesday, Thursday, Saturday for dialysis 07/04/20  Yes [provider]  Methoxy PEG-Epoetin Beta (MIRCERA IJ) Mircera 11/22/21 12/03/22 Yes [provider]  Omega-3 Fatty Acids (FISH OIL) 1000 MG CAPS Take 1,000 mg by mouth in the morning and at bedtime.   Yes [provider]  pantoprazole (PROTONIX) 40 MG tablet Take 40 mg by mouth daily.   Yes [provider]  polyethylene glycol (MIRALAX / GLYCOLAX) 17 g packet Take 17 g by mouth  daily as needed for moderate constipation.   Yes [provider]  Vitamin E 400 units TABS Take 800 Units by mouth in the morning. 01/09/20  Yes [provider]     Vitals:   12/20/21 0036 12/20/21 0038 12/20/21 0452 12/20/21 0744  BP: (!) 106/52  (!) 118/55 (!) 107/58  Pulse: 75  79 75  Resp: 19  19 18   Temp: 98.4 F (36.9 C)  98.3 F (36.8 C) 98.4 F (36.9 C)  TempSrc: Oral  Oral Oral  SpO2: 100%   100%  Weight:  72.6 kg    Height:       Exam Gen alert, no distress No rash, cyanosis or gangrene Sclera anicteric, throat clear  No jvd or bruits Chest clear bilat to bases, no rales/ wheezing RRR no MRG Abd soft ntnd no mass or ascites +bs GU normal MS no joint effusions or deformity Ext no LE or UE edema, no wounds or ulcers Neuro is alert, Ox 3 , nf      Home meds include - coreg 6.25 bid, [place]  1 ac, lasix 80, amaryl, insulin glargine, protonix, prns   OP HD: TTS [facility]  3.5h  400/500  71.6kg  3K/2.5 bath  P2  Hep none AVG    - venofer 100 x 5 , 1 left  -mircera 150 q2, last 2.23  - hep B pend, Covid neg 2/24   Assessment/ Plan: GI bleed - possibly SB bleed, GI consulting.  ESRD - on HD TTS.  HD today on schedule. No heparin.  HTN - close to dry wt, BP's soft Anemia ckd - just got esa as OP, next due in 12 days. Will give last dose of IV fe load.   MBD ckd - Ca in range, add on phos, cont binder.  IDDM - per pmd COPD       Kelly Splinter  MD 12/20/2021, 9:04 AM  Recent Labs  Lab 12/19/21 1701 12/20/21 0227  WBC 7.2 7.2  HGB 8.5* 8.4*   Recent Labs  Lab 12/19/21 1701 12/20/21 0227  K 3.6 3.4*  BUN 34* 36*  CREATININE 3.56* 3.80*  ALBUMIN 3.0* 2.9*  CALCIUM 8.8* 9.0

## 2021-12-20 NOTE — Op Note (Signed)
[facility] Patient Name: Anna Carr Procedure Date : 12/20/2021 MRN: [id number] Attending MD: Estill Cotta. Danis , MD Date of Birth: 18-Aug-1941 CSN: [id number] Age: 81 Admit Type: Inpatient Procedure:                Small bowel enteroscopy Indications:              Arteriovenous malformation in the small intestine                            (bleeding source (suspected VM given prior Hx) in                            proximal small bowel in VCE 12/17/21 Providers:                Mallie Mussel L. Loletha Carrow, MD, Grace Isaac, RN, William Dalton, Technician Referring MD:             [facility] Medicines:                Monitored Anesthesia Care Complications:            No immediate complications. Estimated Blood Loss:     Estimated blood loss was minimal. Procedure:                Pre-Anesthesia Assessment:                           - Prior to the procedure, a History and Physical                            was performed, and patient medications and                            allergies were reviewed. The patient's tolerance of                            previous anesthesia was also reviewed. The risks                            and benefits of the procedure and the sedation                            options and risks were discussed with the patient.                            All questions were answered, and informed consent                            was obtained. Prior Anticoagulants: The patient has                            taken no previous anticoagulant or antiplatelet  agents. ASA Grade Assessment: III - A patient with                            severe systemic disease. After reviewing the risks                            and benefits, the patient was deemed in                            satisfactory condition to undergo the procedure.                           After obtaining informed consent, the endoscope was                             passed under direct vision. Throughout the                            procedure, the patient's blood pressure, pulse, and                            oxygen saturations were monitored continuously. The                            PCF-190TL ([id number] Olympus colonoscope was                            introduced through the mouth and advanced to the                            proximal jejunum. The small bowel enteroscopy was                            performed with difficulty (inherent challenges of                            SB endoscopy and visualization). Scope In: Scope Out: Findings:      The esophagus was normal.      The stomach was normal.      The examined duodenum was normal.      A single angioectasia with bleeding was found in the proximal jejunum.       Not visualized or actively bleeding on scope insertion, then was found       by oozing blood during scope withdrawal (deep between folds). Difficult       position to see and work on. Coagulation for hemostasis using argon       plasma at 0.5 liters/minute and 20 watts was successful. To prevent       bleeding post-intervention, two hemostatic clips were successfully       placed (MR conditional). Clips did not end up in optimal position to       close the defect, but were the best achievable due to location. There       was no bleeding at the end of the procedure. Impression:               -  Normal esophagus.                           - Normal stomach.                           - Normal examined duodenum.                           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                            Treated with argon plasma coagulation (APC). Clips                            (MR conditional) were placed.                           - No specimens collected. Recommendation:           - Return patient to hospital ward for ongoing care.                           - Dialysis today.                            CBC in AM and can be discharged later to day or                            tomorrow AM at discretion of internal medicine                            service.                           Patient needs to continue weekly CBC with dialysis                            and follow up with her primary GI doctor in                            [place], [place] ([place])                           Return for consultation with Dr. Thornton Park                            at [place] as needed. 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 All rights reserved. The codes documented in this report are preliminary and upon coder review may  be revised to meet current compliance requirements. Isael Stille L. Loletha Carrow, MD 12/20/2021 12:26:17 PM This report has been signed electronically. Number of Addenda: 0

## 2021-12-20 NOTE — Assessment & Plan Note (Signed)
No signs of exacerbation.  Plan to follow up as outpatient

## 2021-12-20 NOTE — Assessment & Plan Note (Addendum)
No clinical signs of exacerbation, no signs of volume overload Plan to continue ultrafiltration to keep fluid balance. Her blood pressure remained stable, patient will continue taking carvedilol.

## 2021-12-20 NOTE — Transfer of Care (Signed)
Immediate Anesthesia Transfer of Care Note  Patient: WILHEMINA GRALL  Procedure(s) Performed: ENTEROSCOPY HOT HEMOSTASIS (ARGON PLASMA COAGULATION/BICAP) HEMOSTASIS CLIP PLACEMENT  Patient Location: PACU  Anesthesia Type:MAC  Level of Consciousness: awake, alert  and oriented  Airway & Oxygen Therapy: Patient Spontanous Breathing  Post-op Assessment: Report given to RN and Post -op Vital signs reviewed and stable  Post vital signs: Reviewed and stable  Last Vitals:  Vitals Value Taken Time  BP 94/43 12/20/21 1219  Temp 36.1 C 12/20/21 1215  Pulse 62 12/20/21 1220  Resp 17 12/20/21 1220  SpO2 96 % 12/20/21 1220  Vitals shown include unvalidated device data.  Last Pain:  Vitals:   12/20/21 1215  TempSrc:   PainSc: 0-No pain         Complications: No notable events documented.

## 2021-12-21 DIAGNOSIS — E1169 Type 2 diabetes mellitus with other specified complication: Secondary | ICD-10-CM | POA: Diagnosis not present

## 2021-12-21 DIAGNOSIS — K5521 Angiodysplasia of colon with hemorrhage: Secondary | ICD-10-CM | POA: Diagnosis not present

## 2021-12-21 DIAGNOSIS — I5030 Unspecified diastolic (congestive) heart failure: Secondary | ICD-10-CM | POA: Diagnosis not present

## 2021-12-21 DIAGNOSIS — D500 Iron deficiency anemia secondary to blood loss (chronic): Secondary | ICD-10-CM | POA: Diagnosis not present

## 2021-12-21 LAB — IRON AND TIBC
Iron: 44 ug/dL (ref 28–170)
Saturation Ratios: 19 % (ref 10.4–31.8)
TIBC: 231 ug/dL — ABNORMAL LOW (ref 250–450)
UIBC: 187 ug/dL

## 2021-12-21 LAB — CBC
HCT: 25.6 % — ABNORMAL LOW (ref 36.0–46.0)
Hemoglobin: 8.5 g/dL — ABNORMAL LOW (ref 12.0–15.0)
MCH: 31.6 pg (ref 26.0–34.0)
MCHC: 33.2 g/dL (ref 30.0–36.0)
MCV: 95.2 fL (ref 80.0–100.0)
Platelets: 217 10*3/uL (ref 150–400)
RBC: 2.69 MIL/uL — ABNORMAL LOW (ref 3.87–5.11)
RDW: 15.1 % (ref 11.5–15.5)
WBC: 7 10*3/uL (ref 4.0–10.5)
nRBC: 0 % (ref 0.0–0.2)

## 2021-12-21 LAB — GLUCOSE, CAPILLARY
Glucose-Capillary: 115 mg/dL — ABNORMAL HIGH (ref 70–99)
Glucose-Capillary: 256 mg/dL — ABNORMAL HIGH (ref 70–99)

## 2021-12-21 LAB — FERRITIN: Ferritin: 1223 ng/mL — ABNORMAL HIGH (ref 11–307)

## 2021-12-21 LAB — TRANSFERRIN: Transferrin: 165 mg/dL — ABNORMAL LOW (ref 192–382)

## 2021-12-21 NOTE — Discharge Summary (Signed)
Physician Discharge Summary   Patient: Anna Carr MRN: [id number] DOB: [birth year]  Admit date:     12/19/2021  Discharge date: 12/21/21  Discharge Physician: Anna Carr Anna Carr   PCP: Anna Mina., MD   Recommendations at discharge:    Patient will need weekly complete blood cell count on hemodialysis and follow up with primary gastroenterology in [name].  Continue iron supplementation   Discharge Diagnoses: Principal Problem:   AVM (arteriovenous malformation) of small bowel, acquired with hemorrhage Active Problems:   Anemia due to chronic blood loss   CHF with left ventricular diastolic dysfunction, NYHA class 1 ([name])   Type 2 diabetes mellitus with hyperlipidemia ([name])   ESRD on hemodialysis ([name])   COPD mixed type ([name])   Hypertension   SIADH (syndrome of inappropriate ADH production) ([name])  Resolved Problems:   * No resolved hospital problems. [facility] Course: Anna Carr was admitted to the hospital with the working diagnosis of acute blood loss anemia due to upper GI bleed.   81 yo female with the past medical history of T2DM, SIADH, pulmonary hypertension, paroxysmal atrial fibrillation, hypertension, heart failure, COPD, AVM, upper GI bleed and ESRD who presented with dark stools. She was referred to the hospital by gastroenterology due to suspected active upper GI bleed per capsule endoscopy. On her initial physical examination her blood pressure was 153/53, HR 74, RR 16 and oxygen saturation 100% on room air, her lungs were clear to auscultation, heart with S1 and S2 present with no gallops, abdomen soft and no lower extremity edema.   Na 132, K 3,6, Cl 95, bicarbonate 26, glucose 148, bun 34 and cr 3,56  Wbc 7,2, hgb 8,5, hct 27,6 plt 222 Sars covid 19 negative   Patient  was placed on IV proton pump inhibitors and consulted Gi for endoscopic evaluation, for localization and control of bleeding AVM.   She underwent small  bowel enteroscopy and a single angioectasia with bleeding was found in the proximal jejunum, it was coagulated with argon plasma.  Nephrology was consulted and patient underwent renal replacement therapy per her schedule.   Her hgb has remained stable, she will be discharged home with outpatient follow up.     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   Patient was admitted to the medical ward and she was placed on a remote telemetry monitoring.  Received proton pump inhibitors with good toleration.   Further work up with small bowel enteroscopy showed a with single angioectasia in the jejunum. Treated with argon plasma coagulation.  Patient tolerated procedure well, her hgb remained stable, at discharge 8,5 with hct 25.6   Plan to follow up as outpatient, will need weekly complete blood count on hemodialysis  His primary gastroenterology is in [name], [place] Carr.   Anemia due to chronic blood loss- (present on admission) Combined with anemia of chronic renal disease and iron deficiency.  Patient received IV iron per her home regimen.  Iron stores shoed serum iron of 44, tibc 231, transferrin saturation 19, ferritin 1,223 and transferrin 165. Continue oral iron supplementation and follow up as outpatient.   CHF with left ventricular diastolic dysfunction, NYHA class 1 ([name])- (present on admission) No clinical signs of exacerbation, no signs of volume overload Plan to continue ultrafiltration to keep fluid balance. Her blood pressure remained stable, patient will continue taking  carvedilol.   Type 2 diabetes mellitus with hyperlipidemia ([name])- (present on admission) Her glucose remained stable, at her discharge she will continue with basal insulin and oral hypoglycemic agent with gimeperide.   ESRD on hemodialysis [name]) Nephrology was consulted  and patient underwent renal replacement therapy per her schedule with good toleration.   COPD mixed type ([name])- (present on admission) No signs of exacerbation.  Plan to follow up as outpatient   Hypertension- (present on admission) On carvedilol for blood pressure control.   SIADH (syndrome of inappropriate ADH production) ([name])- (present on admission) Hypokalemia  Na is 135, with K at 3,4 and serum bicarbonate at 25 Continue renal replacement therapy per her home schedule.             Consultants: nephrology and gastroenterology  Procedures performed: small bowel enteroscopy   Disposition: Home Diet recommendation:  Cardiac diet  DISCHARGE MEDICATION: Allergies as of 12/21/2021   No Known Allergies      Medication List     TAKE these medications    ascorbic acid 1000 MG tablet Commonly known as: VITAMIN C Take 1,000 mg by mouth daily in the afternoon.   Biotin 5000 MCG Tabs Take 5,000 mcg by mouth daily in the afternoon.   carboxymethylcellulose 0.5 % Soln Commonly known as: REFRESH PLUS Place 1 drop into both eyes 3 (three) times daily as needed (dry eyes).   carvedilol 6.25 MG tablet Commonly known as: COREG Take 6.25 mg by mouth 2 (two) times daily.   ferric citrate 1 GM 210 MG(Fe) tablet Commonly known as: AURYXIA Take 210 mg by mouth 3 (three) times daily with meals.   Fish Oil 1000 MG Caps Take 1,000 mg by mouth in the morning and at bedtime.   furosemide 80 MG tablet Commonly known as: LASIX Take 80 mg by mouth daily.   glimepiride 4 MG tablet Commonly known as: AMARYL Take 4 mg by mouth 2 (two) times daily.   lidocaine-prilocaine cream Commonly known as: EMLA Apply 1 application topically as needed (prior to fisula access). Tuesday, Thursday, Saturday for dialysis   MIRCERA IJ Mircera   pantoprazole 40 MG tablet Commonly known as: PROTONIX Take 40 mg by mouth daily.   polyethylene glycol 17 g packet Commonly known as: MIRALAX /  GLYCOLAX Take 17 g by mouth daily as needed for moderate constipation.   PROBIOTIC ACIDOPHILUS PO Take 1 capsule by mouth in the morning.   [place] 100-33 UNT-MCG/ML Sopn Generic drug: Insulin Glargine-Lixisenatide Inject 12 Units into the skin in the morning. What changed: how much to take   VITAMIN B-12 PO Take 1 capsule by mouth every evening. Unknown strength   Vitamin E 400 units Tabs Take 800 Units by mouth in the morning.       Patient with no nausea or vomiting, tolerating po well, no chest pain or dyspnea.   Discharge Exam: Filed Weights   12/20/21 1934 12/20/21 2304 12/21/21 0504  Weight: 74.8 kg 74.4 kg 73.7 kg   BP (!) 120/53 (BP Location: Right Wrist)    Pulse 87    Temp 98.3 F (36.8 C) (Oral)    Resp 18    Ht 5\' 1"  (1.549 m)    Wt 73.7 kg    SpO2 93%    BMI 30.70 kg/m   Neurology awake and alert ENT with mild pallor Cardiovascular with S1 and S2 present and rhythmic, no gallops, rubs or murmurs.  No JVD No lower extremity edema.  Respiratory with no  wheezing or rales Abdomen soft and non tender.    Condition at discharge: stable  The results of significant diagnostics from this hospitalization (including imaging, microbiology, ancillary and laboratory) are listed below for reference.   Imaging Studies: No results found.  Microbiology: Results for orders placed or performed during the hospital encounter of 12/19/21  Resp Panel by RT-PCR (Flu A&B, Covid) Nasopharyngeal Swab     Status: None   Collection Time: 12/19/21  4:57 PM   Specimen: Nasopharyngeal Swab; Nasopharyngeal(NP) swabs in vial transport medium  Result Value Ref Range Status   SARS Coronavirus 2 by RT PCR NEGATIVE NEGATIVE Final    Comment: (NOTE) SARS-CoV-2 target nucleic acids are NOT DETECTED.  The SARS-CoV-2 RNA is generally detectable in upper respiratory specimens during the acute phase of infection. The lowest concentration of SARS-CoV-2 viral copies this assay can detect  is 138 copies/mL. A negative result does not preclude SARS-Cov-2 infection and should not be used as the sole basis for treatment or other patient management decisions. A negative result may occur with  improper specimen collection/handling, submission of specimen other than nasopharyngeal swab, presence of viral mutation(s) within the areas targeted by this assay, and inadequate number of viral copies(<138 copies/mL). A negative result must be combined with clinical observations, patient history, and epidemiological information. The expected result is Negative.  Fact Sheet for Patients:  EntrepreneurPulse.com.au  Fact Sheet for Healthcare Providers:  IncredibleEmployment.be  This test is no t yet approved or cleared by the [place] FDA and  has been authorized for detection and/or diagnosis of SARS-CoV-2 by FDA under an Emergency Use Authorization (EUA). This EUA will remain  in effect (meaning this test can be used) for the duration of the COVID-19 declaration under Section 564(b)(1) of the Act, 21 U.S.C.section 360bbb-3(b)(1), unless the authorization is terminated  or revoked sooner.       Influenza A by PCR NEGATIVE NEGATIVE Final   Influenza B by PCR NEGATIVE NEGATIVE Final    Comment: (NOTE) The Xpert Xpress SARS-CoV-2/FLU/RSV plus assay is intended as an aid in the diagnosis of influenza from Nasopharyngeal swab specimens and should not be used as a sole basis for treatment. Nasal washings and aspirates are unacceptable for Xpert Xpress SARS-CoV-2/FLU/RSV testing.  Fact Sheet for Patients: EntrepreneurPulse.com.au  Fact Sheet for Healthcare Providers: IncredibleEmployment.be  This test is not yet approved or cleared by the [place] FDA and has been authorized for detection and/or diagnosis of SARS-CoV-2 by FDA under an Emergency Use Authorization (EUA). This EUA will remain in effect  (meaning this test can be used) for the duration of the COVID-19 declaration under Section 564(b)(1) of the Act, 21 U.S.C. section 360bbb-3(b)(1), unless the authorization is terminated or revoked.  Performed at [facility], [place] [address], [place], [place] [address]     Labs: CBC: Recent Labs  Lab 12/19/21 1701 12/20/21 0227 12/21/21 0304  WBC 7.2 7.2 7.0  NEUTROABS 4.6  --   --   HGB 8.5* 8.4* 8.5*  HCT 27.6* 26.6* 25.6*  MCV 98.9 96.7 95.2  PLT 222 217 702   Basic Metabolic Panel: Recent Labs  Lab 12/19/21 1701 12/20/21 0227  NA 132* 135  K 3.6 3.4*  CL 95* 99  CO2 26 25  GLUCOSE 148* 133*  BUN 34* 36*  CREATININE 3.56* 3.80*  CALCIUM 8.8* 9.0  PHOS  --  4.0   Liver Function Tests: Recent Labs  Lab 12/19/21 1701 12/20/21 0227  AST 16 14*  ALT 8 8  ALKPHOS 80 72  BILITOT <0.1* 0.2*  PROT 6.0* 5.8*  ALBUMIN 3.0* 2.9*   CBG: Recent Labs  Lab 12/20/21 0546 12/20/21 1221 12/20/21 1633 12/20/21 2328 12/21/21 0539  GLUCAP 87 179* 263* 157* 115*    Discharge time spent: greater than 30 minutes.  Signed: Tawni Millers, MD [facility] 12/21/2021

## 2021-12-22 ENCOUNTER — Encounter (HOSPITAL_COMMUNITY): Payer: Self-pay | Admitting: Gastroenterology

## 2021-12-22 LAB — HEPATITIS B SURFACE ANTIBODY, QUANTITATIVE: Hep B S AB Quant (Post): 107.9 m[IU]/mL (ref >9.9)

## 2021-12-22 NOTE — TOC Transition Note (Signed)
Transition of care contact from inpatient facility  Date of discharge: 12/21/21 Date of contact: 12/22/21 Method: Attempted Phone Call Spoke to: No Answer  Attempted to contact patient to discuss transition of care from recent inpatient hospitalization but patient did not pick up the phone. A voicemail was left to reach back out to [facility] HD unit [phone] for any questions and/or concerns.  Patient will follow up with her outpatient HD unit on: Tuesday 12/23/21 at [facility].  Tobie Poet, NP

## 2021-12-24 ENCOUNTER — Telehealth: Payer: Self-pay | Admitting: Gastroenterology

## 2021-12-24 NOTE — Telephone Encounter (Signed)
This is a patient of Dr. Tarri Glenn who was recently put on my office schedule.  I then managed her GI bleeding during last week's hospitalization. ? ?She was given a follow-up clinic appointment with me later this month, but that should be rescheduled with an appointment with Dr. Tarri Glenn. ? ?- HD ?

## 2021-12-25 NOTE — Telephone Encounter (Signed)
Spoke to pt and rescheduled for 01-08-22 with Colletta [place]. Pt is happier that it's a few days sooner. ?

## 2021-12-25 NOTE — Telephone Encounter (Signed)
We will get it straightened out sir. My apologies for the confusion. ?

## 2022-01-02 ENCOUNTER — Ambulatory Visit: Payer: Medicare Other | Admitting: Nurse Practitioner

## 2022-01-02 ENCOUNTER — Encounter: Payer: Self-pay | Admitting: Nurse Practitioner

## 2022-01-02 VITALS — BP 130/84 | Ht 61.5 in | Wt 162.0 lb

## 2022-01-02 DIAGNOSIS — N186 End stage renal disease: Secondary | ICD-10-CM

## 2022-01-02 DIAGNOSIS — Z992 Dependence on renal dialysis: Secondary | ICD-10-CM | POA: Diagnosis not present

## 2022-01-02 DIAGNOSIS — D500 Iron deficiency anemia secondary to blood loss (chronic): Secondary | ICD-10-CM | POA: Diagnosis not present

## 2022-01-02 DIAGNOSIS — K5521 Angiodysplasia of colon with hemorrhage: Secondary | ICD-10-CM

## 2022-01-02 NOTE — Patient Instructions (Signed)
RECOMMENDATIONS: ? ?Continue follow up with your primary care provider and kidney doctor for continued hemoglobin monitoring. ?Go to the emergency room if you develop frequent loose black stools. ?Follow up with [place] as needed. ?Thank you for trusting me with your gastrointestinal care!   ? ?Noralyn Pick, CRNP ? ? ? ?BMI: ? ?If you are age 80 or older, your body mass index should be between 23-30. Your Body mass index is 30.11 kg/m?Marland Kitchen If this is out of the aforementioned range listed, please consider follow up with your Primary Care Provider. ? ?If you are age 8 or younger, your body mass index should be between 19-25. Your Body mass index is 30.11 kg/m?Marland Kitchen If this is out of the aformentioned range listed, please consider follow up with your Primary Care Provider.  ? ?MY CHART: ? ?The [place] GI providers would like to encourage you to use [facility] to communicate with providers for non-urgent requests or questions.  Due to long hold times on the telephone, sending your provider a message by [facility] may be a faster and more efficient way to get a response.  Please allow 48 business hours for a response.  Please remember that this is for non-urgent requests.  ? ?

## 2022-01-02 NOTE — Progress Notes (Signed)
01/02/2022 Anna Carr [id number] [birth year]   Chief Complaint: Anemia, hospital follow up  History of Present Illness: Anna Carr is a 81 year old female with a past medical history of depression, hypertension, hyperlipidemia, CHF, COPD, SIADH, DM II, ESRD on HD, chronic anemia, GI bleed secondary to small bowel AVMs, C. Diff colitis and colon polyps. She is followed by Dr. Tarri Glenn and Dr. Lyda Jester.   She was admitted to the hospital 2/24/ - 12/21/2021 due to having darks stools concerning for recurrent GI bleed. Admission Hg level was 8.5. She received PPI IV. 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 Her Hg level remained stable at 8.5 without further active GI bleeding and she was discharged home 12/21/2021.   She presents today for further GI follow up. She reports passing solid black stools daily, stools have remained black in color since starting oral iron. No loose black stools. No bright red rectal bleeding. She remains on hemodialysis every T/Th/Sat. She possibly receives IV iron as needed during dialysis.   Her most recent labs were done 12/31/2021 which showed a Hg level of 10.7. HCT 32.9.    Hg 8.5 on 12/21/2021 Hg 8.5 on 12/19/2021 Hg 8.4 on 11/19/2021 Hg 8.7 on 11/05/2021  CBC Latest Ref Rng & Units 12/21/2021 12/20/2021 12/19/2021  WBC 4.0 - 10.5 K/uL 7.0 7.2 7.2  Hemoglobin 12.0 - 15.0 g/dL 8.5(L) 8.4(L) 8.5(L)  Hematocrit 36.0 - 46.0 % 25.6(L) 26.6(L) 27.6(L)  Platelets 150 - 400 K/uL 217 217 222    CMP Latest Ref Rng & Units 12/20/2021 12/19/2021 04/01/2021  Glucose 70 - 99 mg/dL 133(H) 148(H) 132(H)  BUN 8 - 23 mg/dL 36(H) 34(H) 29(H)  Creatinine 0.44 - 1.00 mg/dL 3.80(H) 3.56(H) 4.25(H)  Sodium 135 - 145 mmol/L 135 132(L) 127(L)  Potassium 3.5 - 5.1 mmol/L 3.4(L) 3.6 3.0(L)  Chloride 98 - 111 mmol/L 99 95(L) 93(L)  CO2 22 - 32 mmol/L 25 26 25    Calcium 8.9 - 10.3 mg/dL 9.0 8.8(L) 8.2(L)  Total Protein 6.5 - 8.1 g/dL 5.8(L) 6.0(L) 5.4(L)  Total Bilirubin 0.3 - 1.2 mg/dL 0.2(L) <0.1(L) 0.2(L)  Alkaline Phos 38 - 126 U/L 72 80 63  AST 15 - 41 U/L 14(L) 16 14(L)  ALT 0 - 44 U/L 8 8 11     Small bowel enteroscopy by Dr. Loletha Carrow during hospital admission with GI  bleed 12/18/2021: Normal esophagus. - Normal stomach. - Normal examined duodenum - Normal examined duodenum. 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 Treated with argon plasma coagulation (APC). Clips (MR conditional) were placed. - No specimens collected.  Small bowel capsule endoscopy 12/17/2021: 1) Complete capsule endoscopy with adequate prep 2) There is active bleeding starting in the proximal small bowel at 49 minute mark which is 5 minutes beyond the first duodenal image. There is intermittent blood in lumen extending through to 1 hr and 27 minutes. Unable to visualize a culprit lesion.  Small bowel endoscopy 03/21/2021: - Normal esophagus. - Normal stomach. - A single recently bleeding angioectasia in the duodenum. Treated with argon plasma coagulation (APC). 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 Treated with argon plasma coagulation (APC). - No specimens collected  Colonoscopy 02/16/2021:  - Preparation of the colon was fair. - Two diminutive polyps in the transverse colon and in the cecum, removed with a cold snare. Resected and retrieved. - Diverticulosis in the left colon. -  Redundant colon. - The examination was otherwise normal on direct and retroflexion views. A. COLON, CECAL AND TRANSVERSE, POLYPECTOMY:  - Tubular adenoma without high-grade dysplasia or malignancy  - Small serrated polyp, cannot distinguish between a sessile serrated  polyp and hyperplastic polyp   EGD 03/18/2021: Normal larynx. - Normal esophagus. - Normal stomach. - Normal examined duodenum.   Past Medical History:  Diagnosis Date   Anemia, chronic disease  01/23/2017   Arteriovenous fistula, acquired ([place]) 11/02/2019   AVM (arteriovenous malformation)    C. 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     Biotin 5000 MCG TABS Take 5,000 mcg by mouth daily in the afternoon.     carboxymethylcellulose (REFRESH PLUS) 0.5 % SOLN Place 1 drop into both eyes 3 (three) times daily as needed (dry eyes).     carvedilol (COREG) 6.25 MG tablet Take 6.25 mg by mouth 2 (two) times daily.     Cyanocobalamin (VITAMIN B-12 PO) Take 1 capsule by mouth every evening. Unknown strength     ferric citrate (AURYXIA) 1 GM 210 MG(Fe) tablet Take 210 mg by mouth 3 (three) times daily with meals.     furosemide (LASIX) 80 MG tablet Take 80 mg by mouth daily.     glimepiride (AMARYL) 4 MG tablet Take 2 mg by mouth 2 (two) times daily.     Insulin Glargine-Lixisenatide (SOLIQUA) 100-33 UNT-MCG/ML SOPN Inject 12 Units into the skin in the morning. (Patient taking differently: Inject 16 Units into the skin in the morning.)     Lactobacillus (PROBIOTIC ACIDOPHILUS PO) Take 1 capsule by mouth in the  morning.     lidocaine-prilocaine (EMLA) cream Apply 1 application topically as needed (prior to fisula access). Tuesday, Thursday, Saturday for dialysis     Methoxy PEG-Epoetin Beta (MIRCERA IJ) Mircera     Omega-3 Fatty Acids (FISH OIL) 1000 MG CAPS Take 1,000 mg by mouth in the morning and at bedtime.     pantoprazole (PROTONIX) 40 MG tablet Take 40 mg by mouth daily.     polyethylene glycol (MIRALAX / GLYCOLAX) 17 g packet Take 17 g by mouth daily as needed for moderate constipation.     Vitamin E 400 units TABS Take 800 Units by mouth in the morning.     No current facility-administered medications on file prior to visit.   No Known Allergies   Current Medications, Allergies, Past Medical History, Past Surgical History, Family History and Social History were reviewed in [employer] record.   Review of Systems:   Constitutional: Negative  for fever, sweats, chills or weight loss.  Respiratory: Negative for shortness of breath.   Cardiovascular: Negative for chest pain, palpitations and leg swelling.  Gastrointestinal: See HPI.  Musculoskeletal: Negative for back pain or muscle aches.  Neurological: Negative for dizziness, headaches or paresthesias.    Physical Exam: BP 130/84    Ht 5' 1.5" (1.562 m)    Wt 162 lb (73.5 kg)    BMI 30.11 kg/m  General: 81 year old female in NAD.  Head: Normocephalic and atraumatic. Eyes: No scleral icterus. Conjunctiva pink . Ears: Normal auditory acuity. Mouth: No ulcers or lesions.  Lungs: Clear throughout to auscultation. Heart: Regular rate and rhythm, no murmur. Abdomen: Soft, nontender and nondistended. No masses or hepatomegaly. Normal bowel sounds x 4 quadrants.  Rectal: Deferred.  Musculoskeletal: Symmetrical with no gross deformities. Extremities: No edema. LUE graft with weak thrill + bruit.  Neurological: Alert oriented x 4. No focal deficits.  Psychological: Alert and cooperative. Normal mood and  affect  Assessment and Recommendations:   75) 81 year old female with chronic GI blood loss/anemia secondary to small bowel AVMs and ESRD of HD who was admitted to the hospital 12/19/2021 with UGI bleed and stable anemia. Admission Hg level of 8.5. S/P small bowel enteroscopy 12/18/2021 identified a single angioectasia with bleeding in the proximal jejunum treated with APC with successful hemostasis. She was discharged home 12/21/2021 with a Hg level of 8.5. Stools chronically black since taking oral iron. No frequent loose black stools or bright red rectal bleeding. No active GERD symptoms or abdominal pain.  -Continue Pantoprazole 40mg  po QD -PRBC and IV iron with dialysis as needed -Continue oral iron indefinitely -Follow up with Dr. Lyda Jester -Follow up with Dr. Tarri Glenn as needed  -Patient instructed to go to the ED if she develops frequent loose black stools  2) Chronic anemia secondary to chronic GI blood loss and ESRD. On oral iron. Hg 8.5 -> 10.7.  -Continue CBC monitoring per PCP/nephrologist  3) ESRD on HD  4) CHF on Furosemide. LV EF   5) COPD   6) DM II  7) History of tubular adenomatous/sessile serrated and hyperplastic colon polyps per colonoscopy 02/2021. -No further colonoscopies recommended due to age

## 2022-01-07 NOTE — Progress Notes (Signed)
Reviewed and agree with management plans. ? ?Trellis Vanoverbeke L. Shivaun Bilello, MD, MPH  ?

## 2022-01-08 ENCOUNTER — Ambulatory Visit: Payer: Medicare Other | Admitting: Nurse Practitioner

## 2022-01-12 ENCOUNTER — Ambulatory Visit: Payer: Medicare Other | Admitting: Gastroenterology

## 2022-01-12 ENCOUNTER — Ambulatory Visit: Payer: Medicare Other | Admitting: Physician Assistant

## 2022-02-11 ENCOUNTER — Ambulatory Visit: Payer: Medicare Other | Admitting: Cardiology

## 2022-02-11 ENCOUNTER — Encounter: Payer: Self-pay | Admitting: Cardiology

## 2022-02-11 VITALS — BP 128/70 | HR 70 | Ht 61.0 in | Wt 161.6 lb

## 2022-02-11 DIAGNOSIS — N186 End stage renal disease: Secondary | ICD-10-CM

## 2022-02-11 DIAGNOSIS — I471 Supraventricular tachycardia: Secondary | ICD-10-CM

## 2022-02-11 DIAGNOSIS — I5030 Unspecified diastolic (congestive) heart failure: Secondary | ICD-10-CM

## 2022-02-11 DIAGNOSIS — J449 Chronic obstructive pulmonary disease, unspecified: Secondary | ICD-10-CM

## 2022-02-11 DIAGNOSIS — Z992 Dependence on renal dialysis: Secondary | ICD-10-CM

## 2022-02-11 DIAGNOSIS — I340 Nonrheumatic mitral (valve) insufficiency: Secondary | ICD-10-CM | POA: Diagnosis not present

## 2022-02-11 NOTE — Addendum Note (Signed)
Addended by: Truddie Hidden on: 02/11/2022 10:56 AM ? ? Modules accepted: Orders ? ?

## 2022-02-11 NOTE — Progress Notes (Signed)
?Cardiology Office Note:   ? ?Date:  02/11/2022  ? ?ID:  Anna Carr, DOB 1941/09/01, MRN [id number] ? ?PCP:  Raina Mina., MD  ?Cardiologist:  Jenne Campus, MD   ? ?Referring MD: Raina Mina., MD  ? ?No chief complaint on file. ?Cardiac wise doing fine but I am tired ? ?History of Present Illness:   ? ?Anna Carr is a 81 y.o. female with past medical history significant for chronic kidney disease, she is on dialysis, essential hypertension, dyslipidemia, at the end of last year she ended up being in the hospital because of atypical chest pain.  She was find to have minimal abnormality of troponin.  She was transferred to [facility] at the same time she was find to have a GI bleed.  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  Echocardiogram performed in the hospital at the time showed severe mitral regurgitation with pulmonary hypertension.  She also was found to have diastolic dysfunctions. ?She comes today to my office for follow-up.  Overall she seems to be doing well cardiac wise.  Complain of being weak tired exhausted.  Also complained of having some pain in the right back.  No swelling of lower extremities.  No proximal nocturnal dyspnea she does have some fatigue tiredness and dyspnea on exertion. ? ?Past Medical History:  ?Diagnosis Date  ? Anemia, chronic disease 01/23/2017  ? Arteriovenous fistula, acquired ([place]) 11/02/2019  ? AVM (arteriovenous malformation)   ? C. difficile colitis 03/30/2021  ? CHF with left ventricular diastolic dysfunction, NYHA class 1 ([place]) 02/02/2017  ? Cholelithiasis 03/30/2021  ? Colon polyp   ? COPD mixed type ([place])   ? ESRD (end stage renal disease) ([place]) 07/18/2019  ? History of blood transfusion   ? Hyperlipidemia   ? Hypertension   ? Hypervolemia 11/13/2019  ? Hypovolemia 10/27/2021  ? Insomnia   ? Major depressive disorder, recurrent ([place]) 12/22/2015  ? Malaise and fatigue 12/23/2015  ?  Osteoarthritis   ? Osteopenia   ? PAC (premature atrial contraction) 10/04/2020  ? Pain, unspecified 12/06/2019  ? Pneumonia   ? 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 Type 2 diabetes mellitus, with long-term current use of insulin ([place]) 03/11/2019  ? ? ?Past Surgical History:  ?Procedure Laterality Date  ? A/V FISTULAGRAM Left 02/17/2021  ? Procedure: A/V FISTULAGRAM;  Surgeon: Waynetta Sandy, MD;  Location: [facility];  Service: Cardiovascular;  Laterality: Left;  ? APPENDECTOMY    ? AV FISTULA PLACEMENT Left   ? AV FISTULA PLACEMENT Left 03/12/2021  ? Procedure: INSERTION OF LEFT UPPER ARM ARTERIOVENOUS (AV) GORE-TEX GRAFT;  Surgeon: Waynetta Sandy, MD;  Location: [place];  Service: Vascular;  Laterality: Left;  ? COLONOSCOPY WITH PROPOFOL N/A 03/18/2021  ? Procedure: COLONOSCOPY WITH PROPOFOL;  Surgeon: Doran Stabler, MD;  Location: [place];  Service: Gastroenterology;  Laterality: N/A;  ? CYSTECTOMY    ? ENTEROSCOPY N/A 03/21/2021  ? Procedure: ENTEROSCOPY;  Surgeon: Doran Stabler, MD;  Location: [place];  Service: Gastroenterology;  Laterality: N/A;  ? ENTEROSCOPY N/A 12/20/2021  ? Procedure: ENTEROSCOPY;  Surgeon: Doran Stabler, MD;  Location: [place];  Service: Gastroenterology;  Laterality: N/A;  ? ESOPHAGOGASTRODUODENOSCOPY (EGD) WITH PROPOFOL N/A 03/18/2021  ? Procedure: ESOPHAGOGASTRODUODENOSCOPY (EGD) WITH PROPOFOL;  Surgeon: Doran Stabler, MD;  Location: [facility]  ENDOSCOPY;  Service: Gastroenterology;  Laterality: N/A;  ? GIVENS CAPSULE STUDY N/A 03/19/2021  ? Procedure: GIVENS CAPSULE STUDY;  Surgeon: Doran Stabler, MD;  Location: [place];  Service: Gastroenterology;  Laterality: N/A;  ? HEMOSTASIS CLIP PLACEMENT  12/20/2021  ? Procedure: HEMOSTASIS CLIP PLACEMENT;  Surgeon: Doran Stabler, MD;  Location: [place];  Service: Gastroenterology;;  ? HOT HEMOSTASIS N/A  03/21/2021  ? Procedure: HOT HEMOSTASIS (ARGON PLASMA COAGULATION/BICAP);  Surgeon: Doran Stabler, MD;  Location: [place];  Service: Gastroenterology;  Laterality: N/A;  ? HOT HEMOSTASIS N/A 12/20/2021  ? Procedure: HOT HEMOSTASIS (ARGON PLASMA COAGULATION/BICAP);  Surgeon: Doran Stabler, MD;  Location: [place];  Service: Gastroenterology;  Laterality: N/A;  ? HYSTEROSCOPY    ? POLYPECTOMY  03/18/2021  ? Procedure: POLYPECTOMY;  Surgeon: Doran Stabler, MD;  Location: [place];  Service: Gastroenterology;;  ? ? ?Current Medications: ?Current Meds  ?Medication Sig  ? ascorbic acid (VITAMIN C) 1000 MG tablet Take 1,000 mg by mouth daily in the afternoon.  ? Biotin 5000 MCG TABS Take 5,000 mcg by mouth daily in the afternoon.  ? carboxymethylcellulose (REFRESH PLUS) 0.5 % SOLN Place 1 drop into both eyes 3 (three) times daily as needed (dry eyes).  ? carvedilol (COREG) 6.25 MG tablet Take 6.25 mg by mouth 2 (two) times daily.  ? Cyanocobalamin (VITAMIN B-12 PO) Take 1 capsule by mouth every evening. Unknown strength  ? ferric citrate (AURYXIA) 1 GM 210 MG(Fe) tablet Take 210 mg by mouth 3 (three) times daily with meals.  ? furosemide (LASIX) 80 MG tablet Take 80 mg by mouth daily.  ? glimepiride (AMARYL) 4 MG tablet Take 2 mg by mouth 2 (two) times daily.  ? Insulin Glargine-Lixisenatide (SOLIQUA) 100-33 UNT-MCG/ML SOPN Inject 12 Units into the skin in the morning. (Patient taking differently: Inject 16 Units into the skin in the morning.)  ? Lactobacillus (PROBIOTIC ACIDOPHILUS PO) Take 1 capsule by mouth in the morning.  ? lidocaine-prilocaine (EMLA) cream Apply 1 application topically as needed (prior to fisula access). Tuesday, Thursday, Saturday for dialysis  ? Omega-3 Fatty Acids (FISH OIL) 1000 MG CAPS Take 1,000 mg by mouth in the morning and at bedtime.  ? pantoprazole (PROTONIX) 40 MG tablet Take 40 mg by mouth daily.  ? polyethylene glycol (MIRALAX / GLYCOLAX) 17 g packet Take 17 g  by mouth daily as needed for moderate constipation.  ? Vitamin E 400 units TABS Take 800 Units by mouth in the morning.  ?  ? ?Allergies:   Patient has no known allergies.  ? ?Social History  ? ?Socioeconomic History  ? Marital status: Widowed  ?  Spouse name: Not on file  ? Number of children: 3  ? Years of education: Not on file  ? Highest education level: Not on file  ?Occupational History  ? Not on file  ?Tobacco Use  ? Smoking status: Former  ?  Years: 10.00  ?  Types: Cigarettes  ? Smokeless tobacco: Never  ? Tobacco comments:  ?  quit in 1973  ?Vaping Use  ? Vaping Use: Never used  ?Substance and Sexual Activity  ? Alcohol use: Not Currently  ? Drug use: Never  ? Sexual activity: Not Currently  ?Other Topics Concern  ? Not on file  ?Social History Narrative  ? Not on file  ? ?Social Determinants of Health  ? ?Financial Resource Strain: Not on file  ?Food Insecurity: Not on file  ?  Transportation Needs: Not on file  ?Physical Activity: Not on file  ?Stress: Not on file  ?Social Connections: Not on file  ?  ? ?Family History: ?The patient's family history includes Diabetes in her maternal grandfather and mother; Hypertension in her mother; Liver disease in an other family member. ?ROS:   ?Please see the history of present illness.    ?All 14 point review of systems negative except as described per history of present illness ? ?EKGs/Labs/Other Studies Reviewed:   ? ? ? ?Recent Labs: ?03/21/2021: Magnesium 1.9 ?12/20/2021: ALT 8; BUN 36; Creatinine, Ser 3.80; Potassium 3.4; Sodium 135 ?12/21/2021: Hemoglobin 8.5; Platelets 217  ?Recent Lipid Panel ?   ?Component Value Date/Time  ? CHOL 162 03/31/2021 0428  ? TRIG 185 (H) 03/31/2021 0428  ? HDL 37 (L) 03/31/2021 0428  ? CHOLHDL 4.4 03/31/2021 0428  ? VLDL 37 03/31/2021 0428  ? [place] 88 03/31/2021 0428  ? ? ?Physical Exam:   ? ?VS:  BP 128/70   Pulse 70   Ht 5\' 1"  (1.549 m)   Wt 161 lb 9.6 oz (73.3 kg)   SpO2 99%   BMI 30.53 kg/m?    ? ?Wt Readings from Last 3  Encounters:  ?02/11/22 161 lb 9.6 oz (73.3 kg)  ?01/02/22 162 lb (73.5 kg)  ?12/21/21 162 lb 8 oz (73.7 kg)  ?  ? ?GEN:  Well nourished, well developed in no acute distress ?HEENT: Normal ?NECK: No JVD; No ca

## 2022-02-11 NOTE — Patient Instructions (Signed)
Medication Instructions:  ?Your physician recommends that you continue on your current medications as directed. Please refer to the Current Medication list given to you today. ? ?*If you need a refill on your cardiac medications before your next appointment, please call your pharmacy* ? ? ?Lab Work: ?None ordered ?If you have labs (blood work) drawn today and your tests are completely normal, you will receive your results only by: ?MyChart Message (if you have MyChart) OR ?A paper copy in the mail ?If you have any lab test that is abnormal or we need to change your treatment, we will call you to review the results. ? ? ?Testing/Procedures: ?Your physician has requested that you have an echocardiogram. Echocardiography is a painless test that uses sound waves to create images of your heart. It provides your doctor with information about the size and shape of your heart and how well your heart?s chambers and valves are working. This procedure takes approximately one hour. There are no restrictions for this procedure. ? ? ? ?Follow-Up: ?At [facility], you and your health needs are our priority.  As part of our continuing mission to provide you with exceptional heart care, we have created designated Provider Care Teams.  These Care Teams include your primary Cardiologist (physician) and Advanced Practice Providers (APPs -  Physician Assistants and Nurse Practitioners) who all work together to provide you with the care you need, when you need it. ? ?We recommend signing up for the patient portal called "MyChart".  Sign up information is provided on this After Visit Summary.  MyChart is used to connect with patients for Virtual Visits (Telemedicine).  Patients are able to view lab/test results, encounter notes, upcoming appointments, etc.  Non-urgent messages can be sent to your provider as well.   ?To learn more about what you can do with MyChart, go to [email or web address].   ? ?Your next appointment:   ?4  month(s) ? ?The format for your next appointment:   ?In Person ? ?Provider:   ?Jenne Campus, MD ? ? ?Other Instructions ?Echocardiogram ?An echocardiogram is a test that uses sound waves (ultrasound) to produce images of the heart. ?Images from an echocardiogram can provide important information about: ?Heart size and shape. ?The size and thickness and movement of your heart's walls. ?Heart muscle function and strength. ?Heart valve function or if you have stenosis. Stenosis is when the heart valves are too narrow. ?If blood is flowing backward through the heart valves (regurgitation). ?A tumor or infectious growth around the heart valves. ?Areas of heart muscle that are not working well because of poor blood flow or injury from a heart attack. ?Aneurysm detection. An aneurysm is a weak or damaged part of an artery wall. The wall bulges out from the normal force of blood pumping through the body. ?Tell a health care provider about: ?Any allergies you have. ?All medicines you are taking, including vitamins, herbs, eye drops, creams, and over-the-counter medicines. ?Any blood disorders you have. ?Any surgeries you have had. ?Any medical conditions you have. ?Whether you are pregnant or may be pregnant. ?What are the risks? ?Generally, this is a safe test. However, problems may occur, including an allergic reaction to dye (contrast) that may be used during the test. ?What happens before the test? ?No specific preparation is needed. You may eat and drink normally. ?What happens during the test? ?You will take off your clothes from the waist up and put on a hospital gown. ?Electrodes or electrocardiogram (ECG)patches may be placed on  your chest. The electrodes or patches are then connected to a device that monitors your heart rate and rhythm. ?You will lie down on a table for an ultrasound exam. A gel will be applied to your chest to help sound waves pass through your skin. ?A handheld device, called a transducer,  will be pressed against your chest and moved over your heart. The transducer produces sound waves that travel to your heart and bounce back (or "echo" back) to the transducer. These sound waves will be captured in real-time and changed into images of your heart that can be viewed on a video monitor. The images will be recorded on a computer and reviewed by your health care provider. ?You may be asked to change positions or hold your breath for a short time. This makes it easier to get different views or better views of your heart. ?In some cases, you may receive contrast through an IV in one of your veins. This can improve the quality of the pictures from your heart. ?The procedure may vary among health care providers and hospitals.   ?What can I expect after the test? ?You may return to your normal, everyday life, including diet, activities, and medicines, unless your health care provider tells you not to do that. ?Follow these instructions at home: ?It is up to you to get the results of your test. Ask your health care provider, or the department that is doing the test, when your results will be ready. ?Keep all follow-up visits. This is important. ?Summary ?An echocardiogram is a test that uses sound waves (ultrasound) to produce images of the heart. ?Images from an echocardiogram can provide important information about the size and shape of your heart, heart muscle function, heart valve function, and other possible heart problems. ?You do not need to do anything to prepare before this test. You may eat and drink normally. ?After the echocardiogram is completed, you may return to your normal, everyday life, unless your health care provider tells you not to do that. ?This information is not intended to replace advice given to you by your health care provider. Make sure you discuss any questions you have with your health care provider. ?Document Revised: 06/04/2020 Document Reviewed: 06/04/2020 ?Elsevier Patient  Education ? [place]. ? ? ?

## 2022-02-25 ENCOUNTER — Ambulatory Visit (INDEPENDENT_AMBULATORY_CARE_PROVIDER_SITE_OTHER): Payer: Medicare Other

## 2022-02-25 DIAGNOSIS — I5030 Unspecified diastolic (congestive) heart failure: Secondary | ICD-10-CM

## 2022-02-25 DIAGNOSIS — I340 Nonrheumatic mitral (valve) insufficiency: Secondary | ICD-10-CM

## 2022-02-25 LAB — ECHOCARDIOGRAM COMPLETE
AR max vel: 0.96 cm2
AV Area VTI: 0.91 cm2
AV Area mean vel: 0.96 cm2
AV Mean grad: 8 mmHg
AV Peak grad: 12.5 mmHg
Ao pk vel: 1.77 m/s
Area-P 1/2: 3.42 cm2
MV M vel: 5.9 m/s
MV Peak grad: 139.2 mmHg
MV VTI: 0.95 cm2
Radius: 0.7 cm
S' Lateral: 3.6 cm

## 2022-02-26 ENCOUNTER — Telehealth: Payer: Self-pay

## 2022-02-26 NOTE — Telephone Encounter (Signed)
Lm to return my call. Unfortunately only one lab order was collected CBC. I called as soon as I could and asked to return to our office to recollect BMP.  ?

## 2022-02-27 ENCOUNTER — Telehealth: Payer: Self-pay

## 2022-02-27 NOTE — Telephone Encounter (Signed)
Patient is returning call regarding labs. ?

## 2022-02-27 NOTE — Telephone Encounter (Signed)
Unable to reach the patient in multiple occassions. Results mailed  ?

## 2022-02-27 NOTE — Telephone Encounter (Signed)
Called patient and explained that they only drew a CBC when they also needed a BMP and she stated she would come by the [place] office on Monday to have the other lab drawn. I explained that she would not need an appointment, just let them know that she is there for a lab draw. Patient had no further questions at this time. ?

## 2022-02-27 NOTE — Telephone Encounter (Signed)
Patient notified of results and I clarified that the previous message on file regarding blood work was a mistake and that there was no need for blood work. Patient verbalized understanding.  ?

## 2022-06-17 ENCOUNTER — Ambulatory Visit (INDEPENDENT_AMBULATORY_CARE_PROVIDER_SITE_OTHER): Payer: Medicare Other | Admitting: Cardiology

## 2022-06-17 ENCOUNTER — Encounter: Payer: Self-pay | Admitting: Cardiology

## 2022-06-17 VITALS — BP 142/70 | HR 83 | Ht 61.5 in | Wt 162.2 lb

## 2022-06-17 DIAGNOSIS — I340 Nonrheumatic mitral (valve) insufficiency: Secondary | ICD-10-CM | POA: Diagnosis not present

## 2022-06-17 DIAGNOSIS — I272 Pulmonary hypertension, unspecified: Secondary | ICD-10-CM

## 2022-06-17 DIAGNOSIS — I10 Essential (primary) hypertension: Secondary | ICD-10-CM | POA: Diagnosis not present

## 2022-06-17 DIAGNOSIS — I739 Peripheral vascular disease, unspecified: Secondary | ICD-10-CM

## 2022-06-17 DIAGNOSIS — J449 Chronic obstructive pulmonary disease, unspecified: Secondary | ICD-10-CM | POA: Diagnosis not present

## 2022-06-17 NOTE — Addendum Note (Signed)
Addended by: Jacobo Forest D on: 06/17/2022 11:28 AM   Modules accepted: Orders

## 2022-06-17 NOTE — Progress Notes (Signed)
Cardiology Office Note:    Date:  06/17/2022   ID:  Sakeena, Teall July 08, 1941, MRN [id number]  PCP:  Raina Mina., MD  Cardiologist:  Jenne Campus, MD    Referring MD: Raina Mina., MD   Chief Complaint  Patient presents with   Follow-up    History of Present Illness:    KAYE LUOMA is a 80 y.o. female with past medical history significant for chronic kidney disease, she is on dialysis 3 times a week, still urinate a little bit.  Essential hypertension, dyslipidemia, mitral regurgitation which is moderate based on last echocardiogram also history of pulmonary hypertension on the last echocardiogram showed normal pulmonary pressure. She is in my office today for follow-up.  Overall she says she is doing quite okay.  She will be tired and exhausted but overall doing well.  Described to have fatigue tiredness.  No shortness of breath no swelling of lower extremities no paroxysmal nocturnal dyspnea but she described to have some pain in her legs when she walks.  Past Medical History:  Diagnosis Date   Anemia, chronic disease 01/23/2017   Arteriovenous fistula, acquired ([place]) 11/02/2019   AVM (arteriovenous malformation)    C. difficile colitis 03/30/2021   CHF with left ventricular diastolic dysfunction, NYHA class 1 ([place]) 02/02/2017   Cholelithiasis 03/30/2021   Colon polyp    COPD mixed type ([place])    ESRD (end stage renal disease) ([place]) 07/18/2019   History of blood transfusion    Hyperlipidemia    Hypertension    Hypervolemia 11/13/2019   Hypovolemia 10/27/2021   Insomnia    Major depressive disorder, recurrent ([place]) 12/22/2015   Malaise and fatigue 12/23/2015   Osteoarthritis    Osteopenia    PAC (premature atrial contraction) 10/04/2020   Pain, unspecified 12/06/2019   Pneumonia    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   Type 2 diabetes mellitus, with long-term current use of insulin ([place])  03/11/2019    Past Surgical History:  Procedure Laterality Date   A/V FISTULAGRAM Left 02/17/2021   Procedure: A/V FISTULAGRAM;  Surgeon: Waynetta Sandy, MD;  Location: [facility];  Service: Cardiovascular;  Laterality: Left;   APPENDECTOMY     AV FISTULA PLACEMENT Left    AV FISTULA PLACEMENT Left 03/12/2021   Procedure: INSERTION OF LEFT UPPER ARM ARTERIOVENOUS (AV) GORE-TEX GRAFT;  Surgeon: Waynetta Sandy, MD;  Location: [facility];  Service: Vascular;  Laterality: Left;   COLONOSCOPY WITH PROPOFOL N/A 03/18/2021   Procedure: COLONOSCOPY WITH PROPOFOL;  Surgeon: Doran Stabler, MD;  Location: [place];  Service: Gastroenterology;  Laterality: N/A;   CYSTECTOMY     ENTEROSCOPY N/A 03/21/2021   Procedure: ENTEROSCOPY;  Surgeon: Doran Stabler, MD;  Location: [place];  Service: Gastroenterology;  Laterality: N/A;   ENTEROSCOPY N/A 12/20/2021   Procedure: ENTEROSCOPY;  Surgeon: Doran Stabler, MD;  Location: [facility];  Service: Gastroenterology;  Laterality: N/A;   ESOPHAGOGASTRODUODENOSCOPY (EGD) WITH PROPOFOL N/A 03/18/2021   Procedure: ESOPHAGOGASTRODUODENOSCOPY (EGD) WITH PROPOFOL;  Surgeon: Doran Stabler, MD;  Location: [facility];  Service: Gastroenterology;  Laterality: N/A;   GIVENS CAPSULE STUDY N/A 03/19/2021   Procedure: GIVENS CAPSULE STUDY;  Surgeon: Doran Stabler, MD;  Location: [facility];  Service: Gastroenterology;  Laterality: N/A;   HEMOSTASIS CLIP PLACEMENT  12/20/2021   Procedure: HEMOSTASIS CLIP PLACEMENT;  Surgeon: Doran Stabler, MD;  [facility];  Service: Gastroenterology;;   HOT HEMOSTASIS N/A 03/21/2021   Procedure: HOT HEMOSTASIS (ARGON PLASMA COAGULATION/BICAP);  Surgeon: Doran Stabler, MD;  Location: [place];  Service: Gastroenterology;  Laterality: N/A;   HOT HEMOSTASIS N/A 12/20/2021   Procedure: HOT HEMOSTASIS (ARGON PLASMA COAGULATION/BICAP);  Surgeon: Doran Stabler,  MD;  Location: [place] [facility];  Service: Gastroenterology;  Laterality: N/A;   HYSTEROSCOPY     POLYPECTOMY  03/18/2021   Procedure: POLYPECTOMY;  Surgeon: Doran Stabler, MD;  Location: [facility];  Service: Gastroenterology;;    Current Medications: Current Meds  Medication Sig   ascorbic acid (VITAMIN C) 1000 MG tablet Take 1,000 mg by mouth daily in the afternoon.   Biotin 5000 MCG TABS Take 5,000 mcg by mouth daily in the afternoon.   carboxymethylcellulose (REFRESH PLUS) 0.5 % SOLN Place 1 drop into both eyes 3 (three) times daily as needed (dry eyes).   carvedilol (COREG) 6.25 MG tablet Take 6.25 mg by mouth 2 (two) times daily.   Cyanocobalamin (VITAMIN B-12 PO) Take 1 capsule by mouth every evening. Unknown strength   ferric citrate (AURYXIA) 1 GM 210 MG(Fe) tablet Take 210 mg by mouth 3 (three) times daily with meals.   furosemide (LASIX) 80 MG tablet Take 80 mg by mouth daily.   glimepiride (AMARYL) 4 MG tablet Take 2 mg by mouth 2 (two) times daily.   Insulin Glargine-Lixisenatide (SOLIQUA) 100-33 UNT-MCG/ML SOPN Inject 12 Units into the skin in the morning. (Patient taking differently: Inject 16-20 Units into the skin in the morning.)   Lactobacillus (PROBIOTIC ACIDOPHILUS PO) Take 1 capsule by mouth in the morning.   lidocaine-prilocaine (EMLA) cream Apply 1 application topically as needed (prior to fisula access). Tuesday, Thursday, Saturday for dialysis   Omega-3 Fatty Acids (FISH OIL) 1000 MG CAPS Take 1,000 mg by mouth in the morning and at bedtime.   pantoprazole (PROTONIX) 40 MG tablet Take 40 mg by mouth daily.   polyethylene glycol (MIRALAX / GLYCOLAX) 17 g packet Take 17 g by mouth daily as needed for moderate constipation.   Vitamin E 400 units TABS Take 800 Units by mouth in the morning.     Allergies:   Patient has no known allergies.   Social History   Socioeconomic History   Marital status: Widowed    Spouse name: Not on file   Number of children: 3    Years of education: Not on file   Highest education level: Not on file  Occupational History   Not on file  Tobacco Use   Smoking status: Former    Years: 10.00    Types: Cigarettes   Smokeless tobacco: Never   Tobacco comments:    quit in 1973  Vaping Use   Vaping Use: Never used  Substance and Sexual Activity   Alcohol use: Not Currently   Drug use: Never   Sexual activity: Not Currently  Other Topics Concern   Not on file  Social History Narrative   Not on file   Social Determinants of Health   Financial Resource Strain: Not on file  Food Insecurity: Not on file  Transportation Needs: Not on file  Physical Activity: Not on file  Stress: Not on file  Social Connections: Not on file     Family History: The patient's family history includes Diabetes in her maternal grandfather and mother; Hypertension in her mother; Liver disease in an other family member. ROS:   Please see the  history of present illness.    All 14 point review of systems negative except as described per history of present illness  EKGs/Labs/Other Studies Reviewed:      Recent Labs: 12/20/2021: ALT 8; BUN 36; Creatinine, Ser 3.80; Potassium 3.4; Sodium 135 12/21/2021: Hemoglobin 8.5; Platelets 217  Recent Lipid Panel    Component Value Date/Time   CHOL 162 03/31/2021 0428   TRIG 185 (H) 03/31/2021 0428   HDL 37 (L) 03/31/2021 0428   CHOLHDL 4.4 03/31/2021 0428   VLDL 37 03/31/2021 0428   LDLCALC 88 03/31/2021 0428    Physical Exam:    VS:  BP (!) 142/70 (BP Location: Right Arm, Patient Position: Sitting)   Pulse 83   Ht 5' 1.5" (1.562 m)   Wt 162 lb 3.2 oz (73.6 kg)   SpO2 92%   BMI 30.15 kg/m     Wt Readings from Last 3 Encounters:  06/17/22 162 lb 3.2 oz (73.6 kg)  02/11/22 161 lb 9.6 oz (73.3 kg)  01/02/22 162 lb (73.5 kg)     GEN:  Well nourished, well developed in no acute distress HEENT: Normal NECK: No JVD; No carotid bruits LYMPHATICS: No lymphadenopathy CARDIAC: RRR,  holosystolic murmur grade 2/6 best heard left border of the sternum, no rubs, no gallops RESPIRATORY:  Clear to auscultation without rales, wheezing or rhonchi  ABDOMEN: Soft, non-tender, non-distended MUSCULOSKELETAL:  No edema; No deformity  SKIN: Warm and dry LOWER EXTREMITIES: no swelling NEUROLOGIC:  Alert and oriented x 3 PSYCHIATRIC:  Normal affect   ASSESSMENT:    1. Nonrheumatic mitral valve regurgitation   2. Pulmonary hypertension, unspecified ([place])   3. Primary hypertension   4. COPD mixed type ([place])    PLAN:    In order of problems listed above:  Nonrheumatic mitral valve regurgitation now moderate based on last echocardiogram.  We discussed this problem in length.  I told her that the key is proper hydration or rather diuresis which is difficult because of her chronic kidney failure but she does have dialysis 3 times a week.  Another issue is to control blood pressure very well and now it is slightly elevated but she said during dialysis is usually drops in which create a problem it is a difficult balance between giving her enough medications keeping her blood pressure under decent level during dialysis and her congestive heart failure which is related to mitral regurgitation under control.  Overall she seems to be hemodynamically compensated and I think she is doing quite well. Pulmonary hypertension last echocardiogram showed normal pulm artery pressure Still hypertension mildly elevated however she tells me during dialysis it drops therefore we have some limitation what we can do with medication of this situation COPD stable. Dyslipidemia I did review K PN which show LDL 88 HDL 37 Claudications: She described to have pain in her legs.  While walking we will do arterial duplex evaluation   Medication Adjustments/Labs and Tests Ordered: Current medicines are reviewed at length with the patient today.  Concerns regarding medicines are outlined above.  No orders of the  defined types were placed in this encounter.  Medication changes: No orders of the defined types were placed in this encounter.   Signed, Park Liter, MD, [facility] 06/17/2022 11:10 AM    [place]

## 2022-06-17 NOTE — Patient Instructions (Signed)
Medication Instructions:  Your physician recommends that you continue on your current medications as directed. Please refer to the Current Medication list given to you today.  *If you need a refill on your cardiac medications before your next appointment, please call your pharmacy*   Lab Work: None Ordered If you have labs (blood work) drawn today and your tests are completely normal, you will receive your results only by: [place] (if you have MyChart) OR A paper copy in the mail If you have any lab test that is abnormal or we need to change your treatment, we will call you to review the results.   Testing/Procedures: Your physician has requested that you have a lower extremity arterial duplex. This test is an ultrasound of the arteries in the legs. It looks at arterial blood flow in the legs. Allow one hour for Lower Arterial scans. There are no restrictions or special instructions    Follow-Up: At [facility], you and your health needs are our priority.  As part of our continuing mission to provide you with exceptional heart care, we have created designated Provider Care Teams.  These Care Teams include your primary Cardiologist (physician) and Advanced Practice Providers (APPs -  Physician Assistants and Nurse Practitioners) who all work together to provide you with the care you need, when you need it.  We recommend signing up for the patient portal called "MyChart".  Sign up information is provided on this After Visit Summary.  MyChart is used to connect with patients for Virtual Visits (Telemedicine).  Patients are able to view lab/test results, encounter notes, upcoming appointments, etc.  Non-urgent messages can be sent to your provider as well.   To learn more about what you can do with MyChart, go to [email or web address].    Your next appointment:   6 month(s)  The format for your next appointment:   In Person  Provider:   Jenne Campus, MD    Other  Instructions NA

## 2022-07-01 ENCOUNTER — Ambulatory Visit: Payer: Medicare Other | Attending: Cardiology

## 2022-07-01 DIAGNOSIS — I739 Peripheral vascular disease, unspecified: Secondary | ICD-10-CM

## 2022-07-20 ENCOUNTER — Telehealth: Payer: Self-pay

## 2022-07-20 NOTE — Telephone Encounter (Signed)
Results reviewed with pt as per Dr. Krasowski's note.  Pt verbalized understanding and had no additional questions. Routed to PCP  

## 2022-12-28 ENCOUNTER — Encounter: Payer: Self-pay | Admitting: Cardiology

## 2022-12-28 ENCOUNTER — Ambulatory Visit: Payer: Medicare Other | Attending: Cardiology | Admitting: Cardiology

## 2022-12-28 VITALS — BP 120/70 | HR 75 | Ht 61.5 in | Wt 165.0 lb

## 2022-12-28 DIAGNOSIS — I340 Nonrheumatic mitral (valve) insufficiency: Secondary | ICD-10-CM

## 2022-12-28 DIAGNOSIS — Z992 Dependence on renal dialysis: Secondary | ICD-10-CM

## 2022-12-28 DIAGNOSIS — I272 Pulmonary hypertension, unspecified: Secondary | ICD-10-CM | POA: Diagnosis not present

## 2022-12-28 DIAGNOSIS — R0609 Other forms of dyspnea: Secondary | ICD-10-CM

## 2022-12-28 DIAGNOSIS — N186 End stage renal disease: Secondary | ICD-10-CM | POA: Diagnosis not present

## 2022-12-28 NOTE — Progress Notes (Signed)
Cardiology Office Note:    Date:  12/28/2022   ID:  Anna Carr, DOB May 26, 1941, MRN [id number]  PCP:  Raina Mina., MD  Cardiologist:  Jenne Campus, MD    Referring MD: Raina Mina., MD   Chief Complaint  Patient presents with   Follow-up  Doing fine  History of Present Illness:    Anna Carr is a 82 y.o. female with past medical history significant for chronic kidney disease, she is on dialysis 3 times a week, essential hypertension, dyslipidemia, moderate mitral regurgitation, questionable history of pulmonary hypertension but last echocardiogram did not confirm that. She comes today to months for follow-up.  Overall she is doing very well.  She denies of any chest pain tightness squeezing pressure burning chest no palpitations dizziness swelling of lower extremities does have some shortness of breath with exertion and states she is weak and tired the day of dialysis  Past Medical History:  Diagnosis Date   Anemia, chronic disease 01/23/2017   Arteriovenous fistula, acquired ([place]) 11/02/2019   AVM (arteriovenous malformation)    C. 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   SIADH (syndrome of inappropriate ADH production) ([place]) 03/29/2019   Type 2 diabetes mellitus, with long-term current use of insulin ([place]) 03/11/2019    Past Surgical History:  Procedure Laterality Date   A/V FISTULAGRAM Left  02/17/2021   Procedure: A/V FISTULAGRAM;  Surgeon: Waynetta Sandy, MD;  Location: [facility];  Service: Cardiovascular;  Laterality: Left;   APPENDECTOMY     AV FISTULA PLACEMENT Left    AV FISTULA PLACEMENT Left 03/12/2021   Procedure: INSERTION OF LEFT UPPER ARM ARTERIOVENOUS (AV) GORE-TEX GRAFT;  Surgeon: Waynetta Sandy, MD;  Location: [facility];  Service: Vascular;  Laterality: Left;   COLONOSCOPY WITH PROPOFOL N/A 03/18/2021   Procedure: COLONOSCOPY WITH PROPOFOL;  Surgeon: Doran Stabler, MD;  Location: [facility];  Service: Gastroenterology;  Laterality: N/A;   CYSTECTOMY     ENTEROSCOPY N/A 03/21/2021   Procedure: ENTEROSCOPY;  Surgeon: Doran Stabler, MD;  Location: [facility];  Service: Gastroenterology;  Laterality: N/A;   ENTEROSCOPY N/A 12/20/2021   Procedure: ENTEROSCOPY;  Surgeon: Doran Stabler, MD;  Location: [place];  Service: Gastroenterology;  Laterality: N/A;   ESOPHAGOGASTRODUODENOSCOPY (EGD) WITH PROPOFOL N/A 03/18/2021   Procedure: ESOPHAGOGASTRODUODENOSCOPY (EGD) WITH PROPOFOL;  Surgeon: Doran Stabler, MD;  Location: [place];  Service: Gastroenterology;  Laterality: N/A;   GIVENS CAPSULE STUDY N/A 03/19/2021   Procedure: GIVENS CAPSULE STUDY;  Surgeon: Doran Stabler, MD;  Location: [place];  Service: Gastroenterology;  Laterality: N/A;   HEMOSTASIS CLIP PLACEMENT  12/20/2021   Procedure: HEMOSTASIS CLIP PLACEMENT;  Surgeon: Doran Stabler, MD;  Location: [facility];  Service: Gastroenterology;;   HOT HEMOSTASIS N/A 03/21/2021   Procedure: HOT HEMOSTASIS (ARGON PLASMA COAGULATION/BICAP);  Surgeon: Doran Stabler, MD;  Location: [facility];  Service: Gastroenterology;  Laterality: N/A;   HOT HEMOSTASIS N/A 12/20/2021   Procedure: HOT HEMOSTASIS (ARGON PLASMA COAGULATION/BICAP);  Surgeon: Doran Stabler, MD;  Location: [place];  Service: Gastroenterology;  Laterality: N/A;   HYSTEROSCOPY      POLYPECTOMY  03/18/2021   Procedure: POLYPECTOMY;  Surgeon: Doran Stabler, MD;  Location: [facility];  Service: Gastroenterology;;    Current Medications: Current Meds  Medication Sig   ascorbic acid (VITAMIN C) 1000 MG tablet Take 1,000 mg by mouth daily in the afternoon.   Biotin 5000 MCG TABS Take 5,000 mcg by mouth daily in the afternoon.   carboxymethylcellulose (REFRESH PLUS) 0.5 % SOLN Place 1 drop into both eyes 3 (three) times daily as needed (dry eyes).   carvedilol (COREG) 6.25 MG tablet Take 6.25 mg by mouth 2 (two) times daily.   Cyanocobalamin (VITAMIN B-12 PO) Take 1 capsule by mouth every evening. Unknown strength   ferric citrate (AURYXIA) 1 GM 210 MG(Fe) tablet Take 210 mg by mouth 3 (three) times daily with meals.   furosemide (LASIX) 80 MG tablet Take 80 mg by mouth daily.   glimepiride (AMARYL) 4 MG tablet Take 2 mg by mouth daily with breakfast.   Insulin Glargine-Lixisenatide (SOLIQUA) 100-33 UNT-MCG/ML SOPN Inject 12 Units into the skin in the morning. (Patient taking differently: Inject 16-20 Units into the skin in the morning.)   Lactobacillus (PROBIOTIC ACIDOPHILUS PO) Take 1 capsule by mouth in the morning.   lidocaine-prilocaine (EMLA) cream Apply 1 application topically as needed (prior to fisula access). Tuesday, Thursday, Saturday for dialysis   Omega-3 Fatty Acids (FISH OIL) 1000 MG CAPS Take 1,000 mg by mouth in the morning and at bedtime.   pantoprazole (PROTONIX) 40 MG tablet Take 40 mg by mouth daily.   polyethylene glycol (MIRALAX / GLYCOLAX) 17 g packet Take 17 g by mouth daily as needed for moderate constipation.   Vitamin E 400 units TABS Take 800 Units by mouth in the morning.     Allergies:   Patient has no known allergies.   Social History   Socioeconomic History   Marital status: Widowed    Spouse name: Not on file   Number of children: 3   Years of education: Not on file   Highest education level: Not on file  Occupational  History   Not on file  Tobacco Use   Smoking status: Former    Years: 10.00    Types: Cigarettes   Smokeless tobacco: Never   Tobacco comments:    quit in 1973  Vaping Use   Vaping Use: Never used  Substance and Sexual Activity   Alcohol use: Not Currently   Drug use: Never   Sexual activity: Not Currently  Other Topics Concern   Not on file  Social History Narrative   Not on file   Social Determinants of Health   Financial Resource Strain: Not on file  Food Insecurity: Not on file  Transportation Needs: Not on file  Physical Activity: Not on file  Stress: Not on file  Social Connections: Not on file     Family History: The patient's family history includes Diabetes in her maternal grandfather and mother; Hypertension in her mother; Liver disease in an other family member. ROS:   Please see the history of present illness.    All 14 point review of systems negative except as described per history of present illness  EKGs/Labs/Other Studies Reviewed:      Recent Labs: No results found for requested labs within last 365 days.  Recent Lipid Panel    Component Value Date/Time   CHOL 162 03/31/2021 0428   TRIG 185 (H) 03/31/2021 0428   HDL 37 (L) 03/31/2021 0428   CHOLHDL 4.4 03/31/2021 0428   VLDL 37 03/31/2021 0428   LDLCALC 88 03/31/2021 0428    Physical Exam:    VS:  BP 120/70 (BP Location: Right Arm, Patient Position: Sitting, Cuff Size: Normal)   Pulse 75   Ht 5' 1.5" (1.562 m)   Wt 165 lb (74.8 kg)   SpO2 95%   BMI 30.67 kg/m     Wt Readings from Last 3 Encounters:  12/28/22 165 lb (74.8 kg)  06/17/22 162 lb 3.2 oz (73.6 kg)  02/11/22 161 lb 9.6 oz (73.3 kg)     GEN:  Well nourished, well developed in no acute distress HEENT: Normal NECK: No JVD; No carotid bruits LYMPHATICS: No lymphadenopathy CARDIAC: RRR, holosystolic murmur grade 3/6 best heard left border sternum and apex with, no rubs, no gallops RESPIRATORY:  Clear to auscultation  without rales, wheezing or rhonchi  ABDOMEN: Soft, non-tender, non-distended MUSCULOSKELETAL:  No edema; No deformity  SKIN: Warm and dry LOWER EXTREMITIES: no swelling NEUROLOGIC:  Alert and oriented x 3 PSYCHIATRIC:  Normal affect   ASSESSMENT:    1. Nonrheumatic mitral valve regurgitation   2. Pulmonary hypertension, unspecified ([place])   3. ESRD on hemodialysis ([place])   4. Dyspnea on exertion    PLAN:    In order of problems listed above:  Nonrheumatic mitral valve regurgitation.  I will ask her to have echocardiogram repeated to recheck the degree of regurgitation.  Will not change any of her medication right now. Pulmonary hypertension last echocardiogram did not confirm that obviously she does have mitral valve regurgitation which can create pulmonary hypertension therefore need to be rechecked we will schedule her to have echocardiogram. Hemodialysis.  Doing well from that point review. Dyspnea on exertion most likely related to multiple issues will check to see if degree of mitral regurgitation can contribute to the symptoms.   Medication Adjustments/Labs and Tests Ordered: Current medicines are reviewed at length with the patient today.  Concerns regarding medicines are outlined above.  No orders of the defined types were placed in this encounter.  Medication changes: No orders of the defined types were placed in this encounter.   Signed, Park Liter, MD, [facility] 12/28/2022 9:51 AM    [place]

## 2022-12-28 NOTE — Patient Instructions (Signed)
Medication Instructions:  Your physician recommends that you continue on your current medications as directed. Please refer to the Current Medication list given to you today.  *If you need a refill on your cardiac medications before your next appointment, please call your pharmacy*   Lab Work: None Ordered If you have labs (blood work) drawn today and your tests are completely normal, you will receive your results only by: MyChart Message (if you have MyChart) OR A paper copy in the mail If you have any lab test that is abnormal or we need to change your treatment, we will call you to review the results.   Testing/Procedures: Your physician has requested that you have an echocardiogram. Echocardiography is a painless test that uses sound waves to create images of your heart. It provides your doctor with information about the size and shape of your heart and how well your heart's chambers and valves are working. This procedure takes approximately one hour. There are no restrictions for this procedure. Please do NOT wear cologne, perfume, aftershave, or lotions (deodorant is allowed). Please arrive 15 minutes prior to your appointment time.      Follow-Up: At [facility], you and your health needs are our priority.  As part of our continuing mission to provide you with exceptional heart care, we have created designated Provider Care Teams.  These Care Teams include your primary Cardiologist (physician) and Advanced Practice Providers (APPs -  Physician Assistants and Nurse Practitioners) who all work together to provide you with the care you need, when you need it.  We recommend signing up for the patient portal called "MyChart".  Sign up information is provided on this After Visit Summary.  MyChart is used to connect with patients for Virtual Visits (Telemedicine).  Patients are able to view lab/test results, encounter notes, upcoming appointments, etc.  Non-urgent messages can be sent to  your provider as well.   To learn more about what you can do with MyChart, go to [email or web address].com.    Your next appointment:   6 month(s)  The format for your next appointment:   In Person  Provider:   Robert Krasowski, MD    Other Instructions NA  

## 2022-12-28 NOTE — Addendum Note (Signed)
Addended by: Jacobo Forest D on: 12/28/2022 09:59 AM   Modules accepted: Orders

## 2023-01-06 ENCOUNTER — Ambulatory Visit: Payer: Medicare Other | Attending: Cardiology

## 2023-01-06 DIAGNOSIS — R0609 Other forms of dyspnea: Secondary | ICD-10-CM | POA: Diagnosis not present

## 2023-01-06 LAB — ECHOCARDIOGRAM COMPLETE
AR max vel: 0.73 cm2
AV Area VTI: 0.95 cm2
AV Area mean vel: 0.78 cm2
AV Mean grad: 10 mmHg
AV Peak grad: 14.9 mmHg
Ao pk vel: 1.93 m/s
Area-P 1/2: 3.72 cm2
MV M vel: 4.09 m/s
MV Peak grad: 66.7 mmHg
Radius: 0.6 cm
S' Lateral: 4 cm

## 2023-01-13 ENCOUNTER — Telehealth: Payer: Self-pay

## 2023-01-13 ENCOUNTER — Telehealth: Payer: Self-pay | Admitting: Cardiology

## 2023-01-13 DIAGNOSIS — I340 Nonrheumatic mitral (valve) insufficiency: Secondary | ICD-10-CM

## 2023-01-13 NOTE — Telephone Encounter (Signed)
Spoke with pt about Echo results per Dr. Wendy Poet note. She agreed and verbalized understanding. BMP ordered per Dr. Wendy Poet note.

## 2023-01-13 NOTE — Telephone Encounter (Signed)
Patient returned call

## 2023-01-15 ENCOUNTER — Telehealth: Payer: Self-pay | Admitting: Cardiology

## 2023-01-15 NOTE — Telephone Encounter (Signed)
Called the patient's daughter and informed her that the patient needed the lab draw so that Dr. Agustin Cree could decide what medications he could prescribe to help the patients heart beat more effectively. Patient's daughter verbalized understanding and had no further questions at this time.

## 2023-01-15 NOTE — Telephone Encounter (Signed)
Daughter coming in to discuss echo results- she would like a call back as her Mom isn't able to explain her results or why she needed bloodwork  Please call [phone]  Anna Carr is her name

## 2023-01-16 LAB — BASIC METABOLIC PANEL
BUN/Creatinine Ratio: 8 — ABNORMAL LOW (ref 12–28)
BUN: 26 mg/dL (ref 8–27)
CO2: 26 mmol/L (ref 20–29)
Calcium: 9.7 mg/dL (ref 8.7–10.3)
Chloride: 98 mmol/L (ref 96–106)
Creatinine, Ser: 3.19 mg/dL — ABNORMAL HIGH (ref 0.57–1.00)
Glucose: 128 mg/dL — ABNORMAL HIGH (ref 70–99)
Potassium: 4.9 mmol/L (ref 3.5–5.2)
Sodium: 140 mmol/L (ref 134–144)
eGFR: 14 mL/min/{1.73_m2} — ABNORMAL LOW (ref >59)

## 2023-01-16 LAB — SPECIMEN STATUS REPORT

## 2023-01-22 ENCOUNTER — Telehealth: Payer: Self-pay

## 2023-01-22 NOTE — Telephone Encounter (Signed)
Results sent to the patient by mail.

## 2023-01-22 NOTE — Telephone Encounter (Signed)
-----   Message from Park Liter, MD sent at 01/21/2023 12:55 PM EDT ----- Labs stable, continue present management

## 2023-01-26 ENCOUNTER — Telehealth: Payer: Self-pay

## 2023-01-26 NOTE — Telephone Encounter (Signed)
Called pt to discuss labs and ECHO - Dr. Agustin Cree does not want to change any thing or add any medications at this time.

## 2023-01-26 NOTE — Telephone Encounter (Signed)
Pts daughter came by the office to check on medications for pt. She had Echo and then BMP and never heard anything about any medications. Message sent to Dr. Agustin Cree for further instruction.

## 2023-01-29 ENCOUNTER — Telehealth: Payer: Self-pay

## 2023-01-29 NOTE — Telephone Encounter (Signed)
Spoke with pt about ECHO and lab results. Advised per Dr. Bing Matter to continue the same medications and follow up in 6 months.

## 2023-02-15 ENCOUNTER — Encounter (INDEPENDENT_AMBULATORY_CARE_PROVIDER_SITE_OTHER): Payer: Medicare Other | Admitting: Ophthalmology

## 2023-02-15 DIAGNOSIS — H4312 Vitreous hemorrhage, left eye: Secondary | ICD-10-CM

## 2023-02-15 DIAGNOSIS — H35033 Hypertensive retinopathy, bilateral: Secondary | ICD-10-CM | POA: Diagnosis not present

## 2023-02-15 DIAGNOSIS — H43813 Vitreous degeneration, bilateral: Secondary | ICD-10-CM

## 2023-02-15 DIAGNOSIS — I10 Essential (primary) hypertension: Secondary | ICD-10-CM | POA: Diagnosis not present

## 2023-02-15 DIAGNOSIS — E113513 Type 2 diabetes mellitus with proliferative diabetic retinopathy with macular edema, bilateral: Secondary | ICD-10-CM | POA: Diagnosis not present

## 2023-03-15 ENCOUNTER — Encounter (INDEPENDENT_AMBULATORY_CARE_PROVIDER_SITE_OTHER): Payer: Medicare Other | Admitting: Ophthalmology

## 2023-03-17 ENCOUNTER — Encounter (INDEPENDENT_AMBULATORY_CARE_PROVIDER_SITE_OTHER): Payer: Medicare Other | Admitting: Ophthalmology

## 2023-03-17 DIAGNOSIS — E113513 Type 2 diabetes mellitus with proliferative diabetic retinopathy with macular edema, bilateral: Secondary | ICD-10-CM

## 2023-03-17 DIAGNOSIS — I10 Essential (primary) hypertension: Secondary | ICD-10-CM

## 2023-03-17 DIAGNOSIS — H35033 Hypertensive retinopathy, bilateral: Secondary | ICD-10-CM

## 2023-03-17 DIAGNOSIS — H43813 Vitreous degeneration, bilateral: Secondary | ICD-10-CM

## 2023-03-17 DIAGNOSIS — Z794 Long term (current) use of insulin: Secondary | ICD-10-CM

## 2023-03-17 DIAGNOSIS — Z7984 Long term (current) use of oral hypoglycemic drugs: Secondary | ICD-10-CM

## 2023-04-14 ENCOUNTER — Encounter (INDEPENDENT_AMBULATORY_CARE_PROVIDER_SITE_OTHER): Payer: Medicare Other | Admitting: Ophthalmology

## 2023-04-14 DIAGNOSIS — H35033 Hypertensive retinopathy, bilateral: Secondary | ICD-10-CM

## 2023-04-14 DIAGNOSIS — H43813 Vitreous degeneration, bilateral: Secondary | ICD-10-CM

## 2023-04-14 DIAGNOSIS — H35373 Puckering of macula, bilateral: Secondary | ICD-10-CM | POA: Diagnosis not present

## 2023-04-14 DIAGNOSIS — E113513 Type 2 diabetes mellitus with proliferative diabetic retinopathy with macular edema, bilateral: Secondary | ICD-10-CM | POA: Diagnosis not present

## 2023-04-14 DIAGNOSIS — Z794 Long term (current) use of insulin: Secondary | ICD-10-CM

## 2023-04-14 DIAGNOSIS — I10 Essential (primary) hypertension: Secondary | ICD-10-CM

## 2023-04-14 DIAGNOSIS — Z7984 Long term (current) use of oral hypoglycemic drugs: Secondary | ICD-10-CM

## 2023-05-12 ENCOUNTER — Encounter (INDEPENDENT_AMBULATORY_CARE_PROVIDER_SITE_OTHER): Payer: Medicare Other | Admitting: Ophthalmology

## 2023-05-12 DIAGNOSIS — H43813 Vitreous degeneration, bilateral: Secondary | ICD-10-CM | POA: Diagnosis not present

## 2023-05-12 DIAGNOSIS — H35033 Hypertensive retinopathy, bilateral: Secondary | ICD-10-CM | POA: Diagnosis not present

## 2023-05-12 DIAGNOSIS — E113513 Type 2 diabetes mellitus with proliferative diabetic retinopathy with macular edema, bilateral: Secondary | ICD-10-CM | POA: Diagnosis not present

## 2023-05-12 DIAGNOSIS — Z794 Long term (current) use of insulin: Secondary | ICD-10-CM

## 2023-05-12 DIAGNOSIS — I10 Essential (primary) hypertension: Secondary | ICD-10-CM

## 2023-05-12 DIAGNOSIS — Z7984 Long term (current) use of oral hypoglycemic drugs: Secondary | ICD-10-CM

## 2023-06-04 DIAGNOSIS — I272 Pulmonary hypertension, unspecified: Secondary | ICD-10-CM | POA: Diagnosis not present

## 2023-06-04 DIAGNOSIS — I361 Nonrheumatic tricuspid (valve) insufficiency: Secondary | ICD-10-CM | POA: Diagnosis not present

## 2023-06-04 DIAGNOSIS — I340 Nonrheumatic mitral (valve) insufficiency: Secondary | ICD-10-CM | POA: Diagnosis not present

## 2023-06-04 DIAGNOSIS — I342 Nonrheumatic mitral (valve) stenosis: Secondary | ICD-10-CM | POA: Diagnosis not present

## 2023-06-09 DIAGNOSIS — R911 Solitary pulmonary nodule: Secondary | ICD-10-CM | POA: Insufficient documentation

## 2023-06-09 HISTORY — DX: Solitary pulmonary nodule: R91.1

## 2023-06-11 ENCOUNTER — Encounter (INDEPENDENT_AMBULATORY_CARE_PROVIDER_SITE_OTHER): Payer: Medicare Other | Admitting: Ophthalmology

## 2023-06-11 DIAGNOSIS — Z794 Long term (current) use of insulin: Secondary | ICD-10-CM | POA: Diagnosis not present

## 2023-06-11 DIAGNOSIS — E113513 Type 2 diabetes mellitus with proliferative diabetic retinopathy with macular edema, bilateral: Secondary | ICD-10-CM | POA: Diagnosis not present

## 2023-06-11 DIAGNOSIS — Z7984 Long term (current) use of oral hypoglycemic drugs: Secondary | ICD-10-CM

## 2023-06-11 DIAGNOSIS — H35033 Hypertensive retinopathy, bilateral: Secondary | ICD-10-CM

## 2023-06-11 DIAGNOSIS — I10 Essential (primary) hypertension: Secondary | ICD-10-CM

## 2023-06-11 DIAGNOSIS — H43813 Vitreous degeneration, bilateral: Secondary | ICD-10-CM

## 2023-06-11 DIAGNOSIS — H35372 Puckering of macula, left eye: Secondary | ICD-10-CM

## 2023-06-14 ENCOUNTER — Encounter: Payer: Self-pay | Admitting: Internal Medicine

## 2023-06-21 ENCOUNTER — Encounter: Payer: Self-pay | Admitting: Cardiology

## 2023-06-21 ENCOUNTER — Ambulatory Visit: Payer: Medicare Other | Attending: Cardiology | Admitting: Cardiology

## 2023-06-21 VITALS — BP 128/78 | HR 84 | Ht 62.0 in | Wt 165.0 lb

## 2023-06-21 DIAGNOSIS — I350 Nonrheumatic aortic (valve) stenosis: Secondary | ICD-10-CM

## 2023-06-21 DIAGNOSIS — I770 Arteriovenous fistula, acquired: Secondary | ICD-10-CM

## 2023-06-21 DIAGNOSIS — N186 End stage renal disease: Secondary | ICD-10-CM

## 2023-06-21 DIAGNOSIS — I340 Nonrheumatic mitral (valve) insufficiency: Secondary | ICD-10-CM

## 2023-06-21 DIAGNOSIS — I272 Pulmonary hypertension, unspecified: Secondary | ICD-10-CM

## 2023-06-21 DIAGNOSIS — Z992 Dependence on renal dialysis: Secondary | ICD-10-CM

## 2023-06-21 HISTORY — DX: Nonrheumatic aortic (valve) stenosis: I35.0

## 2023-06-21 NOTE — Progress Notes (Signed)
Cardiology Office Note:    Date:  06/21/2023   ID:  Anna Carr, DOB January 26, 1941, MRN [id number]  PCP:  Gordan Payment., MD  Cardiologist:  Gypsy Balsam, MD    Referring MD: Gordan Payment., MD   No chief complaint on file.   History of Present Illness:    Anna Carr is a 82 y.o. female with past medical history significant for chronic kidney disease, she is on dialysis 3 times a week, essential hypertension, dyslipidemia, moderate mitral valve regurgitation, moderate aortic stenosis, mild pulmonary hypertension.  Comes today to months for follow-up recently she was in the hospital apparently during dialysis she became hypotensive short of breath she was transferred to hospital extensive evaluation has been done she was discharged home.  Since that time she is doing well she is waiting for dialysis which will be tomorrow.  Denies have any chest pain tightness squeezing pressure burning chest.  Past Medical History:  Diagnosis Date   Anemia, chronic disease 01/23/2017   Arteriovenous fistula, acquired ([name]) 11/02/2019   AVM (arteriovenous malformation)    C. 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   Type 2 diabetes mellitus, with long-term current use of insulin ([name]) 03/11/2019    Past Surgical History:  Procedure Laterality Date   A/V  FISTULAGRAM Left 02/17/2021   Procedure: A/V FISTULAGRAM;  Surgeon: Maeola Harman, MD;  Location: [facility] INVASIVE CV LAB;  Service: Cardiovascular;  Laterality: Left;   APPENDECTOMY     AV FISTULA PLACEMENT Left    AV FISTULA PLACEMENT Left 03/12/2021   Procedure: INSERTION OF LEFT UPPER ARM ARTERIOVENOUS (AV) GORE-TEX GRAFT;  Surgeon: Maeola Harman, MD;  Location: [facility] OR;  Service: Vascular;  Laterality: Left;   COLONOSCOPY WITH PROPOFOL N/A 03/18/2021   Procedure: COLONOSCOPY WITH PROPOFOL;  Surgeon: Sherrilyn Rist, MD;  Location: [facility] ENDOSCOPY;  Service: Gastroenterology;  Laterality: N/A;   CYSTECTOMY     ENTEROSCOPY N/A 03/21/2021   Procedure: ENTEROSCOPY;  Surgeon: Sherrilyn Rist, MD;  Location: [facility] ENDOSCOPY;  Service: Gastroenterology;  Laterality: N/A;   ENTEROSCOPY N/A 12/20/2021   Procedure: ENTEROSCOPY;  Surgeon: Sherrilyn Rist, MD;  Location: [facility] ENDOSCOPY;  Service: Gastroenterology;  Laterality: N/A;   ESOPHAGOGASTRODUODENOSCOPY (EGD) WITH PROPOFOL N/A 03/18/2021   Procedure: ESOPHAGOGASTRODUODENOSCOPY (EGD) WITH PROPOFOL;  Surgeon: Sherrilyn Rist, MD;  Location: [facility] ENDOSCOPY;  Service: Gastroenterology;  Laterality: N/A;   GIVENS CAPSULE STUDY N/A 03/19/2021   Procedure: GIVENS CAPSULE STUDY;  Surgeon: Sherrilyn Rist, MD;  Location: [facility] ENDOSCOPY;  Service: Gastroenterology;  Laterality: N/A;   HEMOSTASIS CLIP PLACEMENT  12/20/2021   Procedure: HEMOSTASIS CLIP PLACEMENT;  Surgeon: Sherrilyn Rist, MD;  Location: [facility] ENDOSCOPY;  Service: Gastroenterology;;   HOT HEMOSTASIS N/A 03/21/2021   Procedure: HOT HEMOSTASIS (ARGON PLASMA  COAGULATION/BICAP);  Surgeon: Sherrilyn Rist, MD;  Location: [facility] ENDOSCOPY;  Service: Gastroenterology;  Laterality: N/A;   HOT HEMOSTASIS N/A 12/20/2021   Procedure: HOT HEMOSTASIS (ARGON PLASMA COAGULATION/BICAP);  Surgeon: Sherrilyn Rist, MD;  Location: [facility] ENDOSCOPY;  Service: Gastroenterology;  Laterality: N/A;    HYSTEROSCOPY     POLYPECTOMY  03/18/2021   Procedure: POLYPECTOMY;  Surgeon: Sherrilyn Rist, MD;  Location: [facility] ENDOSCOPY;  Service: Gastroenterology;;    Current Medications: Current Meds  Medication Sig   ascorbic acid (VITAMIN C) 1000 MG tablet Take 1,000 mg by mouth daily in the afternoon.   Biotin 5000 MCG TABS Take 5,000 mcg by mouth daily in the afternoon.   carboxymethylcellulose (REFRESH PLUS) 0.5 % SOLN Place 1 drop into both eyes 3 (three) times daily as needed (dry eyes).   carvedilol (COREG) 6.25 MG tablet Take 6.25 mg by mouth 2 (two) times daily.   Cyanocobalamin (VITAMIN B-12 PO) Take 1 capsule by mouth every evening. Unknown strength   ferric citrate (AURYXIA) 1 GM 210 MG(Fe) tablet Take 210 mg by mouth 3 (three) times daily with meals.   furosemide (LASIX) 80 MG tablet Take 80 mg by mouth daily.   glimepiride (AMARYL) 2 MG tablet Take 2 mg by mouth daily with breakfast.   Insulin Glargine-Lixisenatide (SOLIQUA) 100-33 UNT-MCG/ML SOPN Inject 12 Units into the skin in the morning.   Lactobacillus (PROBIOTIC ACIDOPHILUS PO) Take 1 capsule by mouth in the morning.   lidocaine-prilocaine (EMLA) cream Apply 1 application topically as needed (prior to fisula access). Tuesday, Thursday, Saturday for dialysis   Omega-3 Fatty Acids (FISH OIL) 1000 MG CAPS Take 1,000 mg by mouth in the morning and at bedtime.   pantoprazole (PROTONIX) 40 MG tablet Take 40 mg by mouth daily.   polyethylene glycol (MIRALAX / GLYCOLAX) 17 g packet Take 17 g by mouth daily as needed for moderate constipation.   Vitamin E 400 units TABS Take 800 Units by mouth in the morning.     Allergies:   Patient has no known allergies.   Social History   Socioeconomic History   Marital status: Widowed    Spouse name: Not on file   Number of children: 3   Years of education: Not on file   Highest education level: Not on file  Occupational History   Not on file  Tobacco Use   Smoking status: Former     Types: Cigarettes   Smokeless tobacco: Never   Tobacco comments:    quit in 1973  Vaping Use   Vaping status: Never Used  Substance and Sexual Activity   Alcohol use: Not Currently   Drug use: Never   Sexual activity: Not Currently  Other Topics Concern   Not on file  Social History Narrative   Not on file   Social Determinants of Health   Financial Resource Strain: Low Risk  (10/27/2021)   Received from [facility]   Overall Financial Resource Strain (CARDIA)    Difficulty of Paying Living Expenses: Not hard at all  Food Insecurity: Low Risk  (05/26/2023)   Received from [facility]   Food vital sign    Within the past 12 months, you worried that your food would run out before you got money to buy more: Never true    Within the past 12 months, the food you bought just didn't last and you didn't have money to get more. : Never true  Transportation Needs: Not  on file (05/26/2023)  Physical Activity: Not on file  Stress: No Stress Concern Present (10/27/2021)   Received [facility] of Occupational Health - Occupational Stress Questionnaire    Feeling of Stress : Not at all  Social Connections: Unknown (03/10/2022)   Received from [facility], [facility]   Social Network    Social Network: Not on file     Family History: The patient's family history includes Diabetes in her maternal grandfather and mother; Hypertension in her mother; Liver disease in an other family member. ROS:   Please see the history of present illness.    All 14 point review of systems negative except as described per history of present illness  EKGs/Labs/Other Studies Reviewed:         Recent Labs: 01/15/2023: BUN 26; Creatinine, Ser 3.19; Potassium 4.9; Sodium 140  Recent Lipid Panel    Component Value Date/Time   CHOL 162 03/31/2021 0428   TRIG 185 (H) 03/31/2021 0428   HDL 37 (L) 03/31/2021 0428   CHOLHDL 4.4 03/31/2021 0428    VLDL 37 03/31/2021 0428   LDLCALC 88 03/31/2021 0428    Physical Exam:    VS:  BP 128/78 (BP Location: Right Arm, Patient Position: Sitting, Cuff Size: Normal)   Pulse 84   Ht 5\' 2"  (1.575 m)   Wt 165 lb (74.8 kg)   SpO2 94%   BMI 30.18 kg/m     Wt Readings from Last 3 Encounters:  06/21/23 165 lb (74.8 kg)  12/28/22 165 lb (74.8 kg)  06/17/22 162 lb 3.2 oz (73.6 kg)     GEN:  Well nourished, well developed in no acute distress HEENT: Normal NECK: No JVD; No carotid bruits LYMPHATICS: No lymphadenopathy CARDIAC: RRR, systolic ejection murmur grade 1/6 best heard right upper portion of the sternum, holosystolic murmur grade best heard left border sternum great 1/6, no rubs, no gallops RESPIRATORY:  Clear to auscultation without rales, wheezing or rhonchi  ABDOMEN: Soft, non-tender, non-distended MUSCULOSKELETAL:  No edema; No deformity  SKIN: Warm and dry LOWER EXTREMITIES: no swelling NEUROLOGIC:  Alert and oriented x 3 PSYCHIATRIC:  Normal affect   ASSESSMENT:    1. Nonrheumatic mitral valve regurgitation   2. Nonrheumatic aortic valve stenosis   3. Pulmonary hypertension, unspecified ([name])   4. AV fistula ([name])   5. ESRD on hemodialysis ([name])    PLAN:    In order of problems listed above:  Nonrheumatic mitral regurgitation moderate.  She is hemodynamically stable right now with appropriate blood pressure will continue monitoring. Aortic stenosis not critical.  Will repeat echocardiogram in 6 months.  I warned her about the signs and symptoms of aortic stenosis which will be chest pain dizziness passing out shortness of breath prescription let me know if it happens. Pulmonary hypertension only very mild 45 mmHg based on echocardiogram done recently.  I suspect this is probably related to shunt.  Will continue monitoring. End-stage renal disease on dialysis.   Medication Adjustments/Labs and Tests Ordered: Current medicines are reviewed at length with the patient  today.  Concerns regarding medicines are outlined above.  No orders of the defined types were placed in this encounter.  Medication changes: No orders of the defined types were placed in this encounter.   Signed, Georgeanna Lea, MD, [facility] 06/21/2023 2:26 PM    [facility]

## 2023-06-21 NOTE — Patient Instructions (Signed)
Medication Instructions:  Your physician recommends that you continue on your current medications as directed. Please refer to the Current Medication list given to you today.  *If you need a refill on your cardiac medications before your next appointment, please call your pharmacy*   Lab Work: None If you have labs (blood work) drawn today and your tests are completely normal, you will receive your results only by: [place] (if you have MyChart) OR A paper copy in the mail If you have any lab test that is abnormal or we need to change your treatment, we will call you to review the results.   Testing/Procedures: None   Follow-Up: At [facility], you and your health needs are our priority.  As part of our continuing mission to provide you with exceptional heart care, we have created designated Provider Care Teams.  These Care Teams include your primary Cardiologist (physician) and Advanced Practice Providers (APPs -  Physician Assistants and Nurse Practitioners) who all work together to provide you with the care you need, when you need it.  We recommend signing up for the patient portal called "MyChart".  Sign up information is provided on this After Visit Summary.  MyChart is used to connect with patients for Virtual Visits (Telemedicine).  Patients are able to view lab/test results, encounter notes, upcoming appointments, etc.  Non-urgent messages can be sent to your provider as well.   To learn more about what you can do with MyChart, go to [email or web address].    Your next appointment:   3 month(s)  Provider:   Jenne Campus, MD    Other Instructions None

## 2023-07-05 ENCOUNTER — Encounter (INDEPENDENT_AMBULATORY_CARE_PROVIDER_SITE_OTHER): Payer: Medicare Other | Admitting: Ophthalmology

## 2023-07-05 DIAGNOSIS — E113511 Type 2 diabetes mellitus with proliferative diabetic retinopathy with macular edema, right eye: Secondary | ICD-10-CM | POA: Diagnosis not present

## 2023-07-05 DIAGNOSIS — Z7984 Long term (current) use of oral hypoglycemic drugs: Secondary | ICD-10-CM | POA: Diagnosis not present

## 2023-07-05 DIAGNOSIS — Z794 Long term (current) use of insulin: Secondary | ICD-10-CM | POA: Diagnosis not present

## 2023-07-09 ENCOUNTER — Ambulatory Visit: Payer: Medicare Other | Admitting: Cardiology

## 2023-07-12 ENCOUNTER — Encounter (INDEPENDENT_AMBULATORY_CARE_PROVIDER_SITE_OTHER): Payer: Medicare Other | Admitting: Ophthalmology

## 2023-07-16 ENCOUNTER — Encounter (INDEPENDENT_AMBULATORY_CARE_PROVIDER_SITE_OTHER): Payer: Medicare Other | Admitting: Ophthalmology

## 2023-07-16 DIAGNOSIS — I10 Essential (primary) hypertension: Secondary | ICD-10-CM | POA: Diagnosis not present

## 2023-07-16 DIAGNOSIS — Z794 Long term (current) use of insulin: Secondary | ICD-10-CM

## 2023-07-16 DIAGNOSIS — Z7984 Long term (current) use of oral hypoglycemic drugs: Secondary | ICD-10-CM | POA: Diagnosis not present

## 2023-07-16 DIAGNOSIS — H43813 Vitreous degeneration, bilateral: Secondary | ICD-10-CM

## 2023-07-16 DIAGNOSIS — E113513 Type 2 diabetes mellitus with proliferative diabetic retinopathy with macular edema, bilateral: Secondary | ICD-10-CM

## 2023-07-16 DIAGNOSIS — H2511 Age-related nuclear cataract, right eye: Secondary | ICD-10-CM

## 2023-07-16 DIAGNOSIS — H35033 Hypertensive retinopathy, bilateral: Secondary | ICD-10-CM

## 2023-08-20 ENCOUNTER — Encounter (INDEPENDENT_AMBULATORY_CARE_PROVIDER_SITE_OTHER): Payer: Medicare Other | Admitting: Ophthalmology

## 2023-08-27 ENCOUNTER — Encounter (INDEPENDENT_AMBULATORY_CARE_PROVIDER_SITE_OTHER): Payer: Medicare Other | Admitting: Ophthalmology

## 2023-09-22 ENCOUNTER — Encounter: Payer: Self-pay | Admitting: Cardiology

## 2023-09-22 ENCOUNTER — Ambulatory Visit: Payer: Medicare Other | Attending: Cardiology | Admitting: Cardiology

## 2023-09-22 VITALS — BP 118/54 | HR 81 | Ht 61.5 in | Wt 162.8 lb

## 2023-09-22 DIAGNOSIS — I340 Nonrheumatic mitral (valve) insufficiency: Secondary | ICD-10-CM | POA: Diagnosis not present

## 2023-09-22 DIAGNOSIS — Z992 Dependence on renal dialysis: Secondary | ICD-10-CM

## 2023-09-22 DIAGNOSIS — I272 Pulmonary hypertension, unspecified: Secondary | ICD-10-CM | POA: Diagnosis not present

## 2023-09-22 DIAGNOSIS — M79604 Pain in right leg: Secondary | ICD-10-CM

## 2023-09-22 DIAGNOSIS — I350 Nonrheumatic aortic (valve) stenosis: Secondary | ICD-10-CM

## 2023-09-22 DIAGNOSIS — N186 End stage renal disease: Secondary | ICD-10-CM | POA: Diagnosis not present

## 2023-09-22 DIAGNOSIS — R0609 Other forms of dyspnea: Secondary | ICD-10-CM

## 2023-09-22 DIAGNOSIS — M79605 Pain in left leg: Secondary | ICD-10-CM

## 2023-09-22 NOTE — Patient Instructions (Addendum)
Medication Instructions:  Your physician recommends that you continue on your current medications as directed. Please refer to the Current Medication list given to you today.  *If you need a refill on your cardiac medications before your next appointment, please call your pharmacy*   Lab Work: None Ordered If you have labs (blood work) drawn today and your tests are completely normal, you will receive your results only by: MyChart Message (if you have MyChart) OR A paper copy in the mail If you have any lab test that is abnormal or we need to change your treatment, we will call you to review the results.   Testing/Procedures: Your physician has requested that you have a lower extremity arterial duplex. This test is an ultrasound of the arteries in the legs . It looks at arterial blood flow in the legs. Allow one hour for Lower and Upper Arterial scans. There are no restrictions or special instructions.   Your physician has requested that you have an echocardiogram. Echocardiography is a painless test that uses sound waves to create images of your heart. It provides your doctor with information about the size and shape of your heart and how well your heart's chambers and valves are working. This procedure takes approximately one hour. There are no restrictions for this procedure. Please do NOT wear cologne, perfume, aftershave, or lotions (deodorant is allowed). Please arrive 15 minutes prior to your appointment time.  Please note: We ask at that you not bring children with you during ultrasound (echo/ vascular) testing. Due to room size and safety concerns, children are not allowed in the ultrasound rooms during exams. Our front office staff cannot provide observation of children in our lobby area while testing is being conducted. An adult accompanying a patient to their appointment will only be allowed in the ultrasound room at the discretion of the ultrasound technician under special  circumstances. We apologize for any inconvenience.   Please note: We ask at that you not bring children with you during ultrasound (echo/ vascular) testing. Due to room size and safety concerns, children are not allowed in the ultrasound rooms during exams. Our front office staff cannot provide observation of children in our lobby area while testing is being conducted. An adult accompanying a patient to their appointment will only be allowed in the ultrasound room at the discretion of the ultrasound technician under special circumstances. We apologize for any inconvenience.    Follow-Up: At [facility], you and your health needs are our priority.  As part of our continuing mission to provide you with exceptional heart care, we have created designated Provider Care Teams.  These Care Teams include your primary Cardiologist (physician) and Advanced Practice Providers (APPs -  Physician Assistants and Nurse Practitioners) who all work together to provide you with the care you need, when you need it.  We recommend signing up for the patient portal called "MyChart".  Sign up information is provided on this After Visit Summary.  MyChart is used to connect with patients for Virtual Visits (Telemedicine).  Patients are able to view lab/test results, encounter notes, upcoming appointments, etc.  Non-urgent messages can be sent to your provider as well.   To learn more about what you can do with MyChart, go to [email or web address].    Your next appointment:   6 month(s)  The format for your next appointment:   In Person  Provider:   Gypsy Balsam, MD    Other Instructions NA

## 2023-09-22 NOTE — Addendum Note (Signed)
Addended by: Baldo Ash D on: 09/22/2023 01:25 PM   Modules accepted: Orders

## 2023-09-22 NOTE — Progress Notes (Signed)
Cardiology Office Note:    Date:  09/22/2023   ID:  BREYELLE VONGPHAKDY, DOB 07/12/1941, MRN [id number]  PCP:  Gordan Payment., MD  Cardiologist:  Gypsy Balsam, MD    Referring MD: Gordan Payment., MD   Chief Complaint  Patient presents with   Follow-up    History of Present Illness:    Anna Carr is a 82 y.o. female  e with past medical history significant for chronic kidney disease, she is on dialysis 3 times a week, essential hypertension, dyslipidemia, moderate mitral valve regurgitation, moderate aortic stenosis, mild pulmonary hypertension.  Comes today to months for follow-up overall seems to be doing fine.  Tolerating dialysis quite well.  Denies have any chest pain tightness squeezing pressure mid chest.  She complaining of having some pain in the right leg when she walks.  Past Medical History:  Diagnosis Date   Anemia, chronic disease 01/23/2017   Arteriovenous fistula, acquired ([name]) 11/02/2019   AVM (arteriovenous malformation)    C. 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   Type 2 diabetes mellitus, with long-term current use of insulin ([name]) 03/11/2019    Past Surgical History:  Procedure Laterality Date   A/V FISTULAGRAM Left 02/17/2021   Procedure: A/V FISTULAGRAM;  Surgeon: Maeola Harman, MD;  Location: [facility]  INVASIVE CV LAB;  Service: Cardiovascular;  Laterality: Left;   APPENDECTOMY     AV FISTULA PLACEMENT Left    AV FISTULA PLACEMENT Left 03/12/2021   Procedure: INSERTION OF LEFT UPPER ARM ARTERIOVENOUS (AV) GORE-TEX GRAFT;  Surgeon: Maeola Harman, MD;  [facility] OR;  Service: Vascular;  Laterality: Left;   COLONOSCOPY WITH PROPOFOL N/A 03/18/2021   Procedure: COLONOSCOPY WITH PROPOFOL;  Surgeon: Sherrilyn Rist, MD;  Location: [facility] ENDOSCOPY;  Service: Gastroenterology;  Laterality: N/A;   CYSTECTOMY     ENTEROSCOPY N/A 03/21/2021   Procedure: ENTEROSCOPY;  Surgeon: Sherrilyn Rist, MD;  Location: [facility] ENDOSCOPY;  Service: Gastroenterology;  Laterality: N/A;   ENTEROSCOPY N/A 12/20/2021   Procedure: ENTEROSCOPY;  Surgeon: Sherrilyn Rist, MD;  Location: [facility] ENDOSCOPY;  Service: Gastroenterology;  Laterality: N/A;   ESOPHAGOGASTRODUODENOSCOPY (EGD) WITH PROPOFOL N/A 03/18/2021   Procedure: ESOPHAGOGASTRODUODENOSCOPY (EGD) WITH PROPOFOL;  Surgeon: Sherrilyn Rist, MD;  Location: [facility] ENDOSCOPY;  Service: Gastroenterology;  Laterality: N/A;   GIVENS CAPSULE STUDY N/A 03/19/2021   Procedure: GIVENS CAPSULE STUDY;  Surgeon: Sherrilyn Rist, MD;  Location: [facility] ENDOSCOPY;  Service: Gastroenterology;  Laterality: N/A;   HEMOSTASIS CLIP PLACEMENT  12/20/2021   Procedure: HEMOSTASIS CLIP PLACEMENT;  Surgeon: Sherrilyn Rist, MD;  Location: [facility] ENDOSCOPY;  Service: Gastroenterology;;   HOT HEMOSTASIS N/A 03/21/2021   Procedure: HOT HEMOSTASIS (ARGON PLASMA COAGULATION/BICAP);  Surgeon: Sherrilyn Rist, MD;  Location: [facility] ENDOSCOPY;  Service:  Gastroenterology;  Laterality: N/A;   HOT HEMOSTASIS N/A 12/20/2021   Procedure: HOT HEMOSTASIS (ARGON PLASMA COAGULATION/BICAP);  Surgeon: Sherrilyn Rist, MD;  Location: [facility] ENDOSCOPY;  Service: Gastroenterology;  Laterality: N/A;   HYSTEROSCOPY     POLYPECTOMY  03/18/2021   Procedure: POLYPECTOMY;  Surgeon: Sherrilyn Rist, MD;   Location: [facility] ENDOSCOPY;  Service: Gastroenterology;;    Current Medications: Current Meds  Medication Sig   ascorbic acid (VITAMIN C) 1000 MG tablet Take 1,000 mg by mouth daily in the afternoon.   Biotin 5000 MCG TABS Take 5,000 mcg by mouth daily in the afternoon.   carboxymethylcellulose (REFRESH PLUS) 0.5 % SOLN Place 1 drop into both eyes 3 (three) times daily as needed (dry eyes).   carvedilol (COREG) 6.25 MG tablet Take 6.25 mg by mouth 2 (two) times daily.   Cyanocobalamin (VITAMIN B-12 PO) Take 1 capsule by mouth every evening. Unknown strength   ferric citrate (AURYXIA) 1 GM 210 MG(Fe) tablet Take 210 mg by mouth 3 (three) times daily with meals.   furosemide (LASIX) 80 MG tablet Take 80 mg by mouth daily.   glimepiride (AMARYL) 2 MG tablet Take 2 mg by mouth every evening.   Insulin Glargine-Lixisenatide (SOLIQUA) 100-33 UNT-MCG/ML SOPN Inject 12 Units into the skin in the morning. (Patient taking differently: Inject 16-18 Units into the skin See admin instructions. 16 units<200 18 units >200)   Lactobacillus (PROBIOTIC ACIDOPHILUS PO) Take 1 capsule by mouth in the morning.   lidocaine-prilocaine (EMLA) cream Apply 1 application topically as needed (prior to fisula access). Tuesday, Thursday, Saturday for dialysis   Omega-3 Fatty Acids (FISH OIL) 1000 MG CAPS Take 1,000 mg by mouth in the morning and at bedtime.   pantoprazole (PROTONIX) 40 MG tablet Take 40 mg by mouth daily.   polyethylene glycol (MIRALAX / GLYCOLAX) 17 g packet Take 17 g by mouth daily as needed for moderate constipation.   Vitamin E 400 units TABS Take 800 Units by mouth in the morning.     Allergies:   Patient has no known allergies.   Social History   Socioeconomic History   Marital status: Widowed    Spouse name: Not on file   Number of children: 3   Years of education: Not on file   Highest education level: Not on file  Occupational History   Not on file  Tobacco Use   Smoking status: Former     Types: Cigarettes   Smokeless tobacco: Never   Tobacco comments:    quit in 1973  Vaping Use   Vaping status: Never Used  Substance and Sexual Activity   Alcohol use: Not Currently   Drug use: Never   Sexual activity: Not Currently  Other Topics Concern   Not on file  Social History Narrative   Not on file   Social Determinants of Health   Financial Resource Strain: Low Risk  (10/27/2021)   [facility] from [facility]   Overall Financial Resource Strain (CARDIA)    Difficulty of Paying Living Expenses: Not hard at all  Food Insecurity: Low Risk  (05/26/2023)   Received from [facility]   Hunger Vital Sign    Worried About Running Out of Food in the Last Year: Never true    Ran Out of Food in the Last Year: Never true  Transportation Needs: Not on file (05/26/2023)  Physical Activity: Not on file  Stress: No Stress Concern Present (10/27/2021)   Received from [facility], [facility]   [employer] of Occupational Health - Occupational Stress Questionnaire    Feeling of Stress : Not at all  Social Connections: Unknown (03/10/2022)   Received from [facility], [facility]   Social Network    Social Network: Not on file     Family History: The patient's family history includes Diabetes in her maternal grandfather and mother; Hypertension in her mother; Liver disease in an other family member. ROS:   Please see the history of present illness.    All 14 point review of systems negative except as described per history of present illness  EKGs/Labs/Other Studies Reviewed:         Recent Labs: 01/15/2023: BUN 26; Creatinine, Ser 3.19; Potassium 4.9; Sodium 140  Recent Lipid Panel    Component Value Date/Time   CHOL 162 03/31/2021 0428   TRIG 185 (H) 03/31/2021 0428   HDL 37 (L) 03/31/2021 0428   CHOLHDL 4.4 03/31/2021 0428   VLDL 37 03/31/2021 0428   LDLCALC 88 03/31/2021 0428    Physical Exam:    VS:  BP (!) 118/54 (BP Location: Left  Arm, Patient Position: Sitting)   Pulse 81   Ht 5' 1.5" (1.562 m)   Wt 162 lb 12.8 oz (73.8 kg)   SpO2 91%   BMI 30.26 kg/m     Wt Readings from Last 3 Encounters:  09/22/23 162 lb 12.8 oz (73.8 kg)  06/21/23 165 lb (74.8 kg)  12/28/22 165 lb (74.8 kg)     GEN:  Well nourished, well developed in no acute distress HEENT: Normal NECK: No JVD; No carotid bruits LYMPHATICS: No lymphadenopathy CARDIAC: RRR, systolic ejection murmur grade 3/6 best heard right upper portion of the sternum, there is also holosystolic murmur best heard at the left border of the sternum, no rubs, no gallops RESPIRATORY:  Clear to auscultation without rales, wheezing or rhonchi  ABDOMEN: Soft, non-tender, non-distended MUSCULOSKELETAL:  No edema; No deformity  SKIN: Warm and dry LOWER EXTREMITIES: no swelling NEUROLOGIC:  Alert and oriented x 3 PSYCHIATRIC:  Normal affect   ASSESSMENT:    1. Nonrheumatic aortic valve stenosis   2. Nonrheumatic mitral valve regurgitation   3. Pulmonary hypertension, unspecified ([name])   4. ESRD on hemodialysis [facility])    PLAN:    In order of problems listed above:  Aortic stenosis.  Last evaluation in August critical but because of his diabetes as well as kidney failure and dialysis I was scheduled to have another echocardiogram in February. Mitral valve regurgitation echocardiogram will be done as described above  Hypertension will recheck by doing echocardiogram Pain in the right leg while walking.  Will schedule her to have segmental pressures and arterial duplex evaluation of lower extremities.   Medication Adjustments/Labs and Tests Ordered: Current medicines are reviewed at length with the patient today.  Concerns regarding medicines are outlined above.  No orders of the defined types were placed in this encounter.  Medication changes: No orders of the defined types were placed in this encounter.   Signed, Georgeanna Lea, MD, [facility] 09/22/2023 1:16 PM     [facility]

## 2023-11-01 ENCOUNTER — Ambulatory Visit: Payer: Medicare Other

## 2023-11-22 ENCOUNTER — Encounter (INDEPENDENT_AMBULATORY_CARE_PROVIDER_SITE_OTHER): Payer: Medicare Other | Admitting: Ophthalmology

## 2023-11-22 DIAGNOSIS — H4312 Vitreous hemorrhage, left eye: Secondary | ICD-10-CM

## 2023-11-22 DIAGNOSIS — Z794 Long term (current) use of insulin: Secondary | ICD-10-CM | POA: Diagnosis not present

## 2023-11-22 DIAGNOSIS — I10 Essential (primary) hypertension: Secondary | ICD-10-CM

## 2023-11-22 DIAGNOSIS — H35033 Hypertensive retinopathy, bilateral: Secondary | ICD-10-CM

## 2023-11-22 DIAGNOSIS — E113513 Type 2 diabetes mellitus with proliferative diabetic retinopathy with macular edema, bilateral: Secondary | ICD-10-CM | POA: Diagnosis not present

## 2023-11-22 DIAGNOSIS — Z7984 Long term (current) use of oral hypoglycemic drugs: Secondary | ICD-10-CM

## 2023-11-22 DIAGNOSIS — H43813 Vitreous degeneration, bilateral: Secondary | ICD-10-CM

## 2023-11-24 ENCOUNTER — Ambulatory Visit: Payer: Medicare Other | Attending: Cardiology

## 2023-11-24 ENCOUNTER — Ambulatory Visit (INDEPENDENT_AMBULATORY_CARE_PROVIDER_SITE_OTHER): Payer: Medicare Other

## 2023-11-24 DIAGNOSIS — M79604 Pain in right leg: Secondary | ICD-10-CM

## 2023-11-24 DIAGNOSIS — M79605 Pain in left leg: Secondary | ICD-10-CM

## 2023-11-24 LAB — VAS US ABI WITH/WO TBI
Left ABI: 0.53
Right ABI: 0.29

## 2023-12-01 ENCOUNTER — Ambulatory Visit: Payer: Medicare Other | Attending: Cardiology | Admitting: Cardiology

## 2023-12-01 ENCOUNTER — Encounter: Payer: Self-pay | Admitting: Cardiology

## 2023-12-01 VITALS — BP 120/62 | HR 71 | Ht 61.5 in | Wt 160.6 lb

## 2023-12-01 DIAGNOSIS — I739 Peripheral vascular disease, unspecified: Secondary | ICD-10-CM

## 2023-12-01 DIAGNOSIS — I350 Nonrheumatic aortic (valve) stenosis: Secondary | ICD-10-CM | POA: Diagnosis not present

## 2023-12-01 DIAGNOSIS — J449 Chronic obstructive pulmonary disease, unspecified: Secondary | ICD-10-CM

## 2023-12-01 DIAGNOSIS — I340 Nonrheumatic mitral (valve) insufficiency: Secondary | ICD-10-CM | POA: Diagnosis not present

## 2023-12-01 HISTORY — DX: Peripheral vascular disease, unspecified: I73.9

## 2023-12-01 MED ORDER — ASPIRIN 81 MG PO TBEC: 81.0000 mg | DELAYED_RELEASE_TABLET | Freq: Every day | ORAL | Status: DC

## 2023-12-01 MED ORDER — ATORVASTATIN CALCIUM 10 MG PO TABS: 10.0000 mg | ORAL_TABLET | Freq: Every day | ORAL | 3 refills | Status: DC

## 2023-12-01 NOTE — Progress Notes (Signed)
 Cardiology Office Note:    Date:  12/01/2023   ID:  Anna Carr, Anna Carr [birth year], MRN [id number]  PCP:  Thurmond Cathlyn LABOR., MD  Cardiologist:  Lamar Fitch, MD    Referring MD: Thurmond Cathlyn LABOR., MD   Chief Complaint  Patient presents with   Cough   Fatigue   ruptured blood vesel    History of Present Illness:    Anna Carr is a 83 y.o. female past medical history significant for chronic kidney failure, she is on dialysis 3 times a week Tuesday Thursday Saturday, essential hypertension, dyslipidemia, moderate to severe mitral regurgitation, moderate aortic stenosis, mild pulmonary hypertension, recently recognized significant peripheral vascular disease involving lower extremities. Comes today to months for follow-up she complained of being weak tired exhausted.  She also complained of having numbness in the legs anytime she tried to walk even walking from room to room at home create a problem close also some pain in the lower extremities no dizziness no passing out no chest pain  Past Medical History:  Diagnosis Date   Anemia, chronic disease 01/23/2017   Arteriovenous fistula, acquired ([name]) 11/02/2019   AVM (arteriovenous malformation)    C. 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   Type 2 diabetes mellitus, with long-term current use of  insulin  ([name]) 03/11/2019    Past Surgical History:  Procedure Laterality Date   A/V FISTULAGRAM Left 02/17/2021   Procedure: A/V FISTULAGRAM;  Surgeon: Sheree Penne Bruckner, MD;  Location: [facility] INVASIVE CV LAB;  Service: Cardiovascular;  Laterality: Left;   APPENDECTOMY     AV FISTULA PLACEMENT Left    AV FISTULA PLACEMENT Left 03/12/2021   Procedure: INSERTION OF LEFT UPPER ARM ARTERIOVENOUS (AV) GORE-TEX GRAFT;  Surgeon: Sheree Penne Bruckner, MD;  Location: [facility] OR;  Service: Vascular;  Laterality: Left;   COLONOSCOPY WITH PROPOFOL  N/A 03/18/2021   Procedure: COLONOSCOPY WITH PROPOFOL ;  Surgeon: Legrand Victory LITTIE DOUGLAS, MD;  Location: [facility] ENDOSCOPY;  Service: Gastroenterology;  Laterality: N/A;   CYSTECTOMY     ENTEROSCOPY N/A 03/21/2021   Procedure: ENTEROSCOPY;  Surgeon: Legrand Victory LITTIE DOUGLAS, MD;  Location: [facility] ENDOSCOPY;  Service: Gastroenterology;  Laterality: N/A;   ENTEROSCOPY N/A 12/20/2021   Procedure: ENTEROSCOPY;  Surgeon: Legrand Victory LITTIE DOUGLAS, MD;  Location: [facility] ENDOSCOPY;  Service: Gastroenterology;  Laterality: N/A;   ESOPHAGOGASTRODUODENOSCOPY (EGD) WITH PROPOFOL  N/A 03/18/2021   Procedure: ESOPHAGOGASTRODUODENOSCOPY (EGD) WITH PROPOFOL ;  Surgeon: Legrand Victory LITTIE DOUGLAS, MD;  Location: [facility] ENDOSCOPY;  Service: Gastroenterology;  Laterality: N/A;   GIVENS CAPSULE STUDY N/A 03/19/2021   Procedure: GIVENS CAPSULE STUDY;  Surgeon: Legrand Victory LITTIE DOUGLAS, MD;  Location: [facility] ENDOSCOPY;  Service: Gastroenterology;  Laterality: N/A;   HEMOSTASIS CLIP PLACEMENT  12/20/2021   Procedure: HEMOSTASIS CLIP PLACEMENT;  Surgeon: Legrand Victory LITTIE DOUGLAS, MD;  Location: [facility] ENDOSCOPY;  Service: Gastroenterology;;   HOT HEMOSTASIS N/A 03/21/2021   Procedure: HOT HEMOSTASIS (ARGON PLASMA COAGULATION/BICAP);  Surgeon: Legrand Victory LITTIE DOUGLAS, MD;  Location: [facility] ENDOSCOPY;  Service: Gastroenterology;  Laterality: N/A;   HOT HEMOSTASIS N/A 12/20/2021   Procedure: HOT HEMOSTASIS (ARGON PLASMA COAGULATION/BICAP);  Surgeon:  Legrand Victory LITTIE DOUGLAS, MD;  Location: [facility] ENDOSCOPY;  Service: Gastroenterology;  Laterality: N/A;   HYSTEROSCOPY     POLYPECTOMY  03/18/2021   Procedure: POLYPECTOMY;  Surgeon: Legrand Victory LITTIE DOUGLAS, MD;  Location: [facility] ENDOSCOPY;  Service: Gastroenterology;;    Current Medications: Current Meds  Medication Sig   ascorbic acid  (VITAMIN C ) 1000 MG tablet Take 1,000 mg by mouth daily in the afternoon.   Biotin  5000 MCG TABS Take 5,000 mcg by mouth daily in the afternoon.   carboxymethylcellulose (REFRESH PLUS) 0.5 % SOLN Place 1 drop into both eyes 3 (three) times daily as needed (dry eyes).   carvedilol  (COREG ) 6.25 MG tablet Take 6.25 mg by mouth 2 (two) times daily.   Cyanocobalamin  (VITAMIN B-12 PO) Take 1 capsule by mouth every evening. Unknown strength   ferric citrate  (AURYXIA ) 1 GM 210 MG(Fe) tablet Take 210 mg by mouth 3 (three) times daily with meals.   furosemide  (LASIX ) 80 MG tablet Take 80 mg by mouth daily.   Insulin  Glargine-Lixisenatide  (SOLIQUA ) 100-33 UNT-MCG/ML SOPN Inject 12 Units into the skin in the morning. (Patient taking differently: Inject 16-18 Units into the skin See admin instructions. 16 units<200 18 units >200)   Lactobacillus (PROBIOTIC ACIDOPHILUS PO) Take 1 capsule by mouth in the morning.   lidocaine -prilocaine  (EMLA ) cream Apply 1 application topically as needed (prior to fisula access). Tuesday, Thursday, Saturday for dialysis   Omega-3 Fatty Acids (FISH OIL) 1000 MG CAPS Take 1,000 mg by mouth in the morning and at bedtime.   pantoprazole  (PROTONIX ) 40 MG tablet Take 40 mg by mouth daily.   polyethylene glycol (MIRALAX  / GLYCOLAX ) 17 g packet Take 17 g by mouth daily as needed for moderate constipation.   Vitamin E  400 units TABS Take 800 Units by mouth in the morning.   [DISCONTINUED] glimepiride (AMARYL) 2 MG tablet Take 2 mg by mouth every evening.     Allergies:   Patient has no known allergies.   Social History   Socioeconomic History   Marital status:  Widowed    Spouse name: Not on file   Number of children: 3   Years of education: Not on file   Highest education level: Not on file  Occupational History   Not on file  Tobacco Use   Smoking status: Former    Types: Cigarettes   Smokeless tobacco: Never   Tobacco comments:    quit in 1973  Vaping Use   Vaping status: Never Used  Substance and Sexual Activity   Alcohol  use: Not Currently   Drug use: Never   Sexual activity: Not Currently  Other Topics Concern   Not on file  Social History Narrative   Not on file   Social Drivers of Health   Financial Resource Strain: Low Risk  (10/27/2021)   Received from [facility]   Overall Financial Resource Strain (CARDIA)    Difficulty of Paying Living Expenses: Not hard at all  Food Insecurity: Low Risk  (05/26/2023)   Received from [facility]   Hunger Vital Sign    Worried About Running Out of Food in the Last Year: Never true    Ran Out of Food in  the Last Year: Never true  Transportation Needs: Not on file (05/26/2023)  Physical Activity: Not on file  Stress: No Stress Concern Present (10/27/2021)   Received from [facility] of Occupational Health - Occupational Stress Questionnaire    Feeling of Stress : Not at all  Social Connections: Unknown (03/10/2022)   Received from [facility], [facility]   Social Network    Social Network: Not on file     Family History: The patient's family history includes Diabetes in her maternal grandfather and mother; Hypertension in her mother; Liver disease in an other family member. ROS:   Please see the history of present illness.    All 14 point review of systems negative except as described per history of present illness  EKGs/Labs/Other Studies Reviewed:         Recent Labs: 01/15/2023: BUN 26; Creatinine, Ser 3.19; Potassium 4.9; Sodium 140  Recent Lipid Panel    Component Value Date/Time   CHOL 162 03/31/2021 0428    TRIG 185 (H) 03/31/2021 0428   HDL 37 (L) 03/31/2021 0428   CHOLHDL 4.4 03/31/2021 0428   VLDL 37 03/31/2021 0428   LDLCALC 88 03/31/2021 0428    Physical Exam:    VS:  BP 120/62 (BP Location: Right Arm, Patient Position: Sitting)   Pulse 71   Ht 5' 1.5 (1.562 m)   Wt 160 lb 9.6 oz (72.8 kg)   SpO2 93%   BMI 29.85 kg/m     Wt Readings from Last 3 Encounters:  12/01/23 160 lb 9.6 oz (72.8 kg)  09/22/23 162 lb 12.8 oz (73.8 kg)  06/21/23 165 lb (74.8 kg)     GEN:  Well nourished, well developed in no acute distress HEENT: Normal NECK: No JVD; No carotid bruits LYMPHATICS: No lymphadenopathy CARDIAC: RRR, systolic ejection murmur grade 2/6 best heard right upper portion of the sternum, S2 is still present, there is also holosystolic murmur grade 2/6 to 3/6 basilar border sternum, no rubs, no gallops RESPIRATORY:  Clear to auscultation without rales, wheezing or rhonchi  ABDOMEN: Soft, non-tender, non-distended MUSCULOSKELETAL:  No edema; No deformity  SKIN: Warm and dry LOWER EXTREMITIES: no swelling, I cannot feel pulses, there is small ulceration on tip of the right big toe measuring about 2 to 3 mm NEUROLOGIC:  Alert and oriented x 3 PSYCHIATRIC:  Normal affect   ASSESSMENT:    1. Peripheral vascular disease, unspecified ([name]) critical stenosis lower extremity   2. COPD mixed type ([name])   3. Nonrheumatic aortic valve stenosis   4. Nonrheumatic mitral valve regurgitation    PLAN:    In order of problems listed above:  Peripheral vascular disease, peripheral vascular study shows 75 to 99% stenosis in the distal right external iliac artery, the left side she got most critical stenosis in the proximal superficial femoral artery with 75 to 99% stenosis, there is also some concern about potential inflow disease.  She will be referred to Dr. Dorn Dames for evaluation for potential intervention.  In the meantime will initiate antiplatelet therapy, will also initiate  statin. Aortic stenosis she is scheduled to have echocardiogram in few days will wait for results of it.  However and auscultatory finding do not suggest critical lesion. Chronic kidney failure on dialysis which, will continue. I had a done well discussion with her as well as with the daughter about options for this complex clinical scenario.  I told him to be very careful about  any potential foot injury which in her case can lead to catastrophic infection.  Also encourage her to be a little more active with walking,   Medication Adjustments/Labs and Tests Ordered: Current medicines are reviewed at length with the patient today.  Concerns regarding medicines are outlined above.  No orders of the defined types were placed in this encounter.  Medication changes: No orders of the defined types were placed in this encounter.   Signed, Lamar DOROTHA Fitch, MD, [facility] 12/01/2023 3:54 PM    [facility]

## 2023-12-01 NOTE — Patient Instructions (Addendum)
 Medication Instructions:   START: Lipitor 10mg  1 daily  START: Aspirin  81mg  1 tablet daily   Lab Work: Your physician recommends that you return for lab work in: 6 weeks You need to have labs done when you are fasting.  You can come Monday through Friday 8:30 am to 12:00 pm and 1:15 to 4:30. You do not need to make an appointment as the order has already been placed. The labs you are going to have done are AST, ALT Lipids.    Testing/Procedures: None Ordered   Follow-Up: At [facility], you and your health needs are our priority.  As part of our continuing mission to provide you with exceptional heart care, we have created designated Provider Care Teams.  These Care Teams include your primary Cardiologist (physician) and Advanced Practice Providers (APPs -  Physician Assistants and Nurse Practitioners) who all work together to provide you with the care you need, when you need it.  We recommend signing up for the patient portal called MyChart.  Sign up information is provided on this After Visit Summary.  MyChart is used to connect with patients for Virtual Visits (Telemedicine).  Patients are able to view lab/test results, encounter notes, upcoming appointments, etc.  Non-urgent messages can be sent to your provider as well.   To learn more about what you can do with MyChart, go to [email or web address].    Your next appointment:   4 month(s)  The format for your next appointment:   In Person  Provider:   Lamar Fitch, MD    Other Instructions NA

## 2023-12-08 ENCOUNTER — Ambulatory Visit: Payer: Medicare Other

## 2023-12-20 ENCOUNTER — Encounter (INDEPENDENT_AMBULATORY_CARE_PROVIDER_SITE_OTHER): Payer: Medicare Other | Admitting: Ophthalmology

## 2023-12-20 DIAGNOSIS — E113513 Type 2 diabetes mellitus with proliferative diabetic retinopathy with macular edema, bilateral: Secondary | ICD-10-CM | POA: Diagnosis not present

## 2023-12-20 DIAGNOSIS — Z794 Long term (current) use of insulin: Secondary | ICD-10-CM | POA: Diagnosis not present

## 2023-12-20 DIAGNOSIS — Z7984 Long term (current) use of oral hypoglycemic drugs: Secondary | ICD-10-CM

## 2023-12-20 DIAGNOSIS — H4313 Vitreous hemorrhage, bilateral: Secondary | ICD-10-CM | POA: Diagnosis not present

## 2023-12-20 DIAGNOSIS — H35033 Hypertensive retinopathy, bilateral: Secondary | ICD-10-CM

## 2023-12-20 DIAGNOSIS — H2511 Age-related nuclear cataract, right eye: Secondary | ICD-10-CM

## 2023-12-20 DIAGNOSIS — I10 Essential (primary) hypertension: Secondary | ICD-10-CM

## 2023-12-20 DIAGNOSIS — H43813 Vitreous degeneration, bilateral: Secondary | ICD-10-CM

## 2023-12-29 ENCOUNTER — Ambulatory Visit: Payer: Medicare Other | Attending: Cardiology

## 2023-12-29 DIAGNOSIS — R0609 Other forms of dyspnea: Secondary | ICD-10-CM | POA: Diagnosis not present

## 2023-12-29 LAB — ECHOCARDIOGRAM COMPLETE
AR max vel: 0.82 cm2
AV Area VTI: 0.75 cm2
AV Area mean vel: 0.81 cm2
AV Mean grad: 7 mmHg
AV Peak grad: 11.8 mmHg
Ao pk vel: 1.72 m/s
Area-P 1/2: 3.77 cm2
MV M vel: 5.67 m/s
MV Peak grad: 128.6 mmHg
MV VTI: 0.72 cm2
Radius: 0.5 cm
S' Lateral: 3.9 cm

## 2023-12-30 ENCOUNTER — Telehealth: Payer: Self-pay

## 2023-12-30 ENCOUNTER — Telehealth: Payer: Self-pay | Admitting: Cardiology

## 2023-12-30 NOTE — Telephone Encounter (Signed)
LM  and mailed a letter requesting a call back

## 2023-12-30 NOTE — Telephone Encounter (Signed)
 Attempted to call the patient's daughter back. She did not answer the phone and her mailbox was full so unable to leave a message.

## 2023-12-30 NOTE — Telephone Encounter (Signed)
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  She has an appointment to see Anna Carr on the 24th he can discuss further with her at that time.

## 2023-12-30 NOTE — Telephone Encounter (Signed)
 Patient scheduled with Elliot Gurney on Monday 03/10

## 2023-12-30 NOTE — Telephone Encounter (Signed)
 Patient returned staff call regarding results.

## 2023-12-30 NOTE — Telephone Encounter (Signed)
 Daughter would like a c/b to discuss Echo results. Nurse called pt and gave her the results but daughter is concerned and wants to discuss it further.

## 2023-12-31 NOTE — Progress Notes (Unsigned)
  Cardiology Office Note:  .   Date:  12/31/2023  ID:  Anna Carr, DOB January 09, 1941, MRN [id number] PCP: Gordan Payment., MD  [facility] Providers Cardiologist:  Thomasene Ripple, DO { Click to update primary MD,subspecialty MD or APP then REFRESH:1}   History of Present Illness: .   Anna Carr is a 83 y.o. female with a past medical history of moderate to severe aortic stenosis, moderate to severe mitral regurgitation ESRD on HD, hypertension, dyslipidemia  12/29/2023 echo EF of 60 to 65%, moderately elevated PASP, moderate to severe mitral regurgitation and mild to moderate stenosis, mild to moderate tricuspid regurgitation, no evidence of aortic valve stenosis. 06/04/2023 echo EF 55 to 60%, moderate mitral regurgitation with mild to moderate mitral stenosis, moderate aortic stenosis, mild to moderate tricuspid regurgitation 01/31/2020 Lexiscan normal, low risk study  Most recently evaluated by Dr. Bing Matter on 12/01/2023, concerned with exhaustion and lower extremity numbness.  Echocardiogram was arranged, she was advised to follow-up with Dr. Gery Pray regarding her PVD.  Echocardiogram was completed on 12/29/2023 revealing an EF of 60 to 65%, moderately elevated PASP, moderate to severe mitral regurgitation and mild to moderate stenosis, mild to moderate tricuspid regurgitation, no evidence of aortic valve stenosis.  ROS: ***  Studies Reviewed: .        *** Risk Assessment/Calculations:   {Does this patient have ATRIAL FIBRILLATION?:[id number]} No BP recorded.  {Refresh Note OR Click here to enter BP  :1}***       Physical Exam:   VS:  There were no vitals taken for this visit.   Wt Readings from Last 3 Encounters:  12/01/23 160 lb 9.6 oz (72.8 kg)  09/22/23 162 lb 12.8 oz (73.8 kg)  06/21/23 165 lb (74.8 kg)    GEN: Well nourished, well developed in no acute distress NECK: No JVD; No carotid bruits CARDIAC: ***RRR, no murmurs, rubs, gallops RESPIRATORY:  Clear to  auscultation without rales, wheezing or rhonchi  ABDOMEN: Soft, non-tender, non-distended EXTREMITIES:  No edema; No deformity   ASSESSMENT AND PLAN: .   ***    {Are you ordering a CV Procedure (e.g. stress test, cath, DCCV, TEE, etc)?   Press F2        :[id number]}  Dispo: ***  Signed, Flossie Dibble, NP

## 2023-12-31 NOTE — Telephone Encounter (Signed)
 Attempted to call daughter [name] back. Mailbox full. Unable to leave message. Pt has appt with Victorino Dike 3-10 -25.

## 2024-01-03 ENCOUNTER — Telehealth: Payer: Self-pay | Admitting: Cardiology

## 2024-01-03 ENCOUNTER — Ambulatory Visit: Attending: Cardiology | Admitting: Cardiology

## 2024-01-03 ENCOUNTER — Encounter: Payer: Self-pay | Admitting: Cardiology

## 2024-01-03 VITALS — BP 100/70 | HR 74 | Ht 61.5 in | Wt 165.0 lb

## 2024-01-03 DIAGNOSIS — I272 Pulmonary hypertension, unspecified: Secondary | ICD-10-CM | POA: Diagnosis not present

## 2024-01-03 DIAGNOSIS — I340 Nonrheumatic mitral (valve) insufficiency: Secondary | ICD-10-CM

## 2024-01-03 DIAGNOSIS — M79605 Pain in left leg: Secondary | ICD-10-CM

## 2024-01-03 DIAGNOSIS — I739 Peripheral vascular disease, unspecified: Secondary | ICD-10-CM

## 2024-01-03 DIAGNOSIS — Z992 Dependence on renal dialysis: Secondary | ICD-10-CM

## 2024-01-03 DIAGNOSIS — R0609 Other forms of dyspnea: Secondary | ICD-10-CM

## 2024-01-03 DIAGNOSIS — N186 End stage renal disease: Secondary | ICD-10-CM

## 2024-01-03 DIAGNOSIS — I350 Nonrheumatic aortic (valve) stenosis: Secondary | ICD-10-CM

## 2024-01-03 DIAGNOSIS — M79604 Pain in right leg: Secondary | ICD-10-CM

## 2024-01-03 NOTE — Telephone Encounter (Signed)
 Spoke with pts daughter Angie Fava per [facility] note. She verbalized understanding and had no further questions.

## 2024-01-03 NOTE — Patient Instructions (Signed)
 Medication Instructions:  Your physician recommends that you continue on your current medications as directed. Please refer to the Current Medication list given to you today.  *If you need a refill on your cardiac medications before your next appointment, please call your pharmacy*   Lab Work: None Ordered If you have labs (blood work) drawn today and your tests are completely normal, you will receive your results only by: MyChart Message (if you have MyChart) OR A paper copy in the mail If you have any lab test that is abnormal or we need to change your treatment, we will call you to review the results.   Testing/Procedures: None Ordered   Follow-Up: At [facility], you and your health needs are our priority.  As part of our continuing mission to provide you with exceptional heart care, we have created designated Provider Care Teams.  These Care Teams include your primary Cardiologist (physician) and Advanced Practice Providers (APPs -  Physician Assistants and Nurse Practitioners) who all work together to provide you with the care you need, when you need it.  We recommend signing up for the patient portal called "MyChart".  Sign up information is provided on this After Visit Summary.  MyChart is used to connect with patients for Virtual Visits (Telemedicine).  Patients are able to view lab/test results, encounter notes, upcoming appointments, etc.  Non-urgent messages can be sent to your provider as well.   To learn more about what you can do with MyChart, go to [email or web address].    Your next appointment:   3 month(s)  The format for your next appointment:   In Person  Provider:   Wallis Bamberg, NP   Other Instructions NA

## 2024-01-12 ENCOUNTER — Encounter: Payer: Self-pay | Admitting: Cardiovascular Disease

## 2024-01-12 ENCOUNTER — Ambulatory Visit: Attending: Cardiovascular Disease | Admitting: Cardiovascular Disease

## 2024-01-12 VITALS — BP 134/64 | HR 77 | Ht 61.5 in | Wt 159.0 lb

## 2024-01-12 DIAGNOSIS — I739 Peripheral vascular disease, unspecified: Secondary | ICD-10-CM

## 2024-01-12 NOTE — H&P (View-Only) (Signed)
 01/12/2024 Anna Carr   05-07-1941  [id number]  Primary Physician Gordan Payment., MD Primary Cardiologist: Runell Gess MD Milagros Loll, [place], [place]  HPI:  Anna Carr is a 83 y.o. mildly overweight widowed Caucasian female mother of 2 living children (1 deceased), grandmother of 2 grandchildren who is accompanied by one of her daughters [name] today.  She was referred by Wallis Bamberg, NP in [facility] for evaluation of PAD.  She is a cardiology patient of Dr. Vanetta Shawl.  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  She has a history of treated hypertension, diabetes and hyperlipidemia.  She is never had heart attack or stroke.  She does complain of dyspnea.  She has been on dialysis for last 4 years.  She does have valvular heart disease with echo performed 12/29/2023 revealing normal EF, moderately elevated PA systolic pressure, moderate to severe MR, mild to moderate MS, mild to moderate TR with no evidence of aortic stenosis.  She has had numbness in her legs with fatigue over the last year however in the last week or 2 she has noticed severe limitation of ambulation related to right leg discomfort.  She had Dopplers performed 11/24/2023 revealing a right ABI of 0.29 and a left of 0.53.  She did have a high-frequency signal in her right extrailiac artery and moderate disease in her left SFA.  Her symptoms are lifestyle-limiting.  She wishes to proceed with outpatient peripheral angiography and endovascular therapy.   Current Meds  Medication Sig   ascorbic acid (VITAMIN C) 1000 MG tablet Take 1,000 mg by mouth daily in the afternoon.   aspirin EC 81 MG tablet Take 1 tablet (81 mg total) by mouth daily. Swallow whole.   atorvastatin (LIPITOR) 10 MG tablet Take 1 tablet (10 mg total) by mouth daily.   Biotin 5000 MCG TABS Take 5,000 mcg by mouth daily in the afternoon.   carboxymethylcellulose (REFRESH PLUS) 0.5 % SOLN Place 1 drop into both eyes 3 (three) times daily as needed  (dry eyes).   carvedilol (COREG) 6.25 MG tablet Take 6.25 mg by mouth 2 (two) times daily.   Cyanocobalamin (VITAMIN B-12 PO) Take 1 capsule by mouth every evening. Unknown strength   ferric citrate (AURYXIA) 1 GM 210 MG(Fe) tablet Take 210 mg by mouth 3 (three) times daily with meals.   furosemide (LASIX) 80 MG tablet Take 80 mg by mouth daily.   Insulin Glargine-Lixisenatide (SOLIQUA) 100-33 UNT-MCG/ML SOPN Inject 12 Units into the skin in the morning. (Patient taking differently: Inject 16-18 Units into the skin See admin instructions. 16 units<200 18 units >200)   Lactobacillus (PROBIOTIC ACIDOPHILUS PO) Take 1 capsule by mouth in the morning.   lidocaine-prilocaine (EMLA) cream Apply 1 application topically as needed (prior to fisula access). Tuesday, Thursday, Saturday for dialysis   Omega-3 Fatty Acids (FISH OIL) 1000 MG CAPS Take 1,000 mg by mouth in the morning and at bedtime.   pantoprazole (PROTONIX) 40 MG tablet Take 40 mg by mouth daily.   polyethylene glycol (MIRALAX / GLYCOLAX) 17 g packet Take 17 g by mouth daily as needed for moderate constipation.   Vitamin E 400 units TABS Take 800 Units by mouth in the morning.     No Known Allergies  Social History   Socioeconomic History   Marital status: Widowed    Spouse name: Not on file   Number of children: 3   Years of education: Not on file   Highest education  level: Not on file  Occupational History   Not on file  Tobacco Use   Smoking status: Former    Types: Cigarettes   Smokeless tobacco: Never   Tobacco comments:    quit in 1973  Vaping Use   Vaping status: Never Used  Substance and Sexual Activity   Alcohol use: Not Currently   Drug use: Never   Sexual activity: Not Currently  Other Topics Concern   Not on file  Social History Narrative   Not on file   Social Drivers of Health   Financial Resource Strain: Low Risk  (10/27/2021)   Received from [facility], [facility]   Overall Financial Resource  Strain (CARDIA)    Difficulty of Paying Living Expenses: Not hard at all  Food Insecurity: Low Risk  (05/26/2023)   Received from [facility]   Hunger Vital Sign    Worried About Running Out of Food in the Last Year: Never true    Ran Out of Food in the Last Year: Never true  Transportation Needs: Not on file (05/26/2023)  Physical Activity: Not on file  Stress: No Stress Concern Present (10/27/2021)   Received from [facility], [facility]   [employer] of Occupational Health - Occupational Stress Questionnaire    Feeling of Stress : Not at all  Social Connections: Unknown (03/10/2022)   Received from [facility], [facility]   Social Network    Social Network: Not on file  Intimate Partner Violence: Unknown (01/30/2022)   Received from [facility], [facility]   HITS    Physically Hurt: Not on file    Insult or Talk Down To: Not on file    Threaten Physical Harm: Not on file    Scream or Curse: Not on file     Review of Systems: General: negative for chills, fever, night sweats or weight changes.  Cardiovascular: negative for chest pain, dyspnea on exertion, edema, orthopnea, palpitations, paroxysmal nocturnal dyspnea or shortness of breath Dermatological: negative for rash Respiratory: negative for cough or wheezing Urologic: negative for hematuria Abdominal: negative for nausea, vomiting, diarrhea, bright red blood per rectum, melena, or hematemesis Neurologic: negative for visual changes, syncope, or dizziness All other systems reviewed and are otherwise negative except as noted above.    Blood pressure 134/64, pulse 77, height 5' 1.5" (1.562 m), weight 159 lb (72.1 kg), SpO2 93%.  General appearance: alert and no distress Neck: no adenopathy, no JVD, supple, symmetrical, trachea midline, thyroid not enlarged, symmetric, no tenderness/mass/nodules, and bilateral carotid bruits Lungs: clear to auscultation bilaterally Heart: 2/6 outflow tract murmur  consistent with aortic stenosis. Extremities: extremities normal, atraumatic, no cyanosis or edema Pulses: Diminished pedal pulses Skin: Skin color, texture, turgor normal. No rashes or lesions Neurologic: Grossly normal  EKG not performed today      ASSESSMENT AND PLAN:   Peripheral vascular disease, unspecified ([name]) critical stenosis lower extremity Ms. Taborn was referred to me by Wallis Bamberg, NP in [place] because of symptomatic PAD.  She is a patient of Dr. Vanetta Shawl.  She has multiple comorbidities including treated hypertension, diabetes and hyperlipidemia.  She does have valvular heart disease including mitral stenosis, aortic stenosis and mitral Cedric Fishman.  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  She is complained of leg numbness and pain for the last year on ambulation however in the last week or so this is gotten progressively worse to the point where she is now not able to  ambulate principally because of pain in her right leg.  She had Dopplers performed 11/24/2023 revealing a right ABI of 0.29 and a left of 0.53.  She had a high-frequency signal in her right extrailiac artery with monophasic waveforms below that and high-grade disease in her left SFA .  She wishes to proceed with outpatient peripheral angiography and endovascular therapy for lifestyle-limiting claudication.  Will need to adjust her dialysis schedule to accommodate her procedure.     Runell Gess MD FACP,FACC,FAHA, [facility] 01/12/2024 12:23 PM

## 2024-01-12 NOTE — Assessment & Plan Note (Signed)
 Anna Carr was referred to me by Wallis Bamberg, NP in [facility] because of symptomatic PAD.  She is a patient of Dr. Vanetta Shawl.  She has multiple comorbidities including treated hypertension, diabetes and hyperlipidemia.  She does have valvular heart disease including mitral stenosis, aortic stenosis and mitral Cedric Fishman.  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  She is complained of leg numbness and pain for the last year on ambulation however in the last week or so this is gotten progressively worse to the point where she is now not able to ambulate principally because of pain in her right leg.  She had Dopplers performed 11/24/2023 revealing a right ABI of 0.29 and a left of 0.53.  She had a high-frequency signal in her right extrailiac artery with monophasic waveforms below that and high-grade disease in her left SFA .  She wishes to proceed with outpatient peripheral angiography and endovascular therapy for lifestyle-limiting claudication.  Will need to adjust her dialysis schedule to accommodate her procedure.

## 2024-01-12 NOTE — Patient Instructions (Addendum)
 Medication Instructions:  Your physician recommends that you continue on your current medications as directed. Please refer to the Current Medication list given to you today.  *If you need a refill on your cardiac medications before your next appointment, please call your pharmacy*   Lab Work: Your physician recommends that you have labs drawn today: BMET & CBC  If you have labs (blood work) drawn today and your tests are completely normal, you will receive your results only by: MyChart Message (if you have MyChart) OR A paper copy in the mail If you have any lab test that is abnormal or we need to change your treatment, we will call you to review the results.   Testing/Procedures: Your physician has requested that you have a carotid duplex. This test is an ultrasound of the carotid arteries in your neck. It looks at blood flow through these arteries that supply the brain with blood. Allow one hour for this exam. There are no restrictions or special instructions. This will take place at [address] [facility] [facility], [address].  Please note: We ask at that you not bring children with you during ultrasound (echo/ vascular) testing. Due to room size and safety concerns, children are not allowed in the ultrasound rooms during exams. Our front office staff cannot provide observation of children in our lobby area while testing is being conducted. An adult accompanying a patient to their appointment will only be allowed in the ultrasound room at the discretion of the ultrasound technician under special circumstances. We apologize for any inconvenience.   Your physician has requested that you have a lower extremity arterial duplex. During this test, ultrasound is used to evaluate arterial blood flow in the legs. Allow one hour for this exam. There are no restrictions or special instructions. This will take place at [address] [facility], [address]. **To do 1-2 weeks after your procedure (4/10)**  Please note: We  ask at that you not bring children with you during ultrasound (echo/ vascular) testing. Due to room size and safety concerns, children are not allowed in the ultrasound rooms during exams. Our front office staff cannot provide observation of children in our lobby area while testing is being conducted. An adult accompanying a patient to their appointment will only be allowed in the ultrasound room at the discretion of the ultrasound technician under special circumstances. We apologize for any inconvenience.  Your physician has requested that you have an ankle brachial index (ABI). During this test an ultrasound and blood pressure cuff are used to evaluate the arteries that supply the arms and legs with blood. Allow thirty minutes for this exam. There are no restrictions or special instructions. This will take place at [address] [facility] [facility], [address]. **To do 1-2 weeks after your procedure (4/10)**   Please note: We ask at that you not bring children with you during ultrasound (echo/ vascular) testing. Due to room size and safety concerns, children are not allowed in the ultrasound rooms during exams. Our front office staff cannot provide observation of children in our lobby area while testing is being conducted. An adult accompanying a patient to their appointment will only be allowed in the ultrasound room at the discretion of the ultrasound technician under special circumstances. We apologize for any inconvenience.    Follow-Up: At [facility], you and your health needs are our priority.  As part of our continuing mission to provide you with exceptional heart care, we have created designated Provider Care Teams.  These  Care Teams include your primary Cardiologist (physician) and Advanced Practice Providers (APPs -  Physician Assistants and Nurse Practitioners) who all work together to provide you with the care you need, when you need it.  We recommend signing up for the patient portal called  "MyChart".  Sign up information is provided on this After Visit Summary.  MyChart is used to connect with patients for Virtual Visits (Telemedicine).  Patients are able to view lab/test results, encounter notes, upcoming appointments, etc.  Non-urgent messages can be sent to your provider as well.   To learn more about what you can do with MyChart, go to [email or web address].    Your next appointment:   2-3 week(s) after your procedure (4/10)   Provider:   [name], MD     Other Instructions       Cardiac/Peripheral Catheterization   You are scheduled for a Peripheral Angiogram on Thursday, April 10 with Dr. [name].  1. Please arrive at the [facility] (Main Entrance A) at [facility]: [address] [place], [place] [address] at 5:30 AM (This time is 2 hour(s) before your procedure to ensure your preparation).   Free valet parking service is available. You will check in at ADMITTING. The support person will be asked to wait in the waiting room.  It is OK to have someone drop you off and come back when you are ready to be discharged.        Special note: Every effort is made to have your procedure done on time. Please understand that emergencies sometimes delay scheduled procedures.  2. Diet: Do not eat solid foods after midnight.  You may have clear liquids until 5 AM the day of the procedure.  3. Labs: You will need to have blood drawn today (3/19).  4. Medication instructions in preparation for your procedure:    Stop taking, Lasix (Furosemide)  Thursday, April 10,  Do not take any insulin on the day of the procedure.  On the morning of your procedure, take Aspirin 81 mg and any morning medicines NOT listed above.  You may use sips of water.  5. Plan to go home the same day, you will only stay overnight if medically necessary. 6. You MUST have a responsible adult to drive you home. 7. An adult MUST be with you the first 24 hours after you arrive  home. 8. Bring a current list of your medications, and the last time and date medication taken. 9. Bring ID and current insurance cards. 10.Please wear clothes that are easy to get on and off and wear slip-on shoes.  Thank you for allowing [place] to care for you!   -- [place] [facility]     1st Floor: - Lobby - Registration  - Pharmacy  - Lab - Cafe  2nd Floor: - PV Lab - Diagnostic Testing (echo, CT, nuclear med)  3rd Floor: - Vacant  4th Floor: - TCTS (cardiothoracic surgery) - AFib Clinic - Structural Heart Clinic - Vascular Surgery  - Vascular Ultrasound  5th Floor: - HeartCare Cardiology (general and EP) - Clinical Pharmacy for coumadin, hypertension, lipid, weight-loss medications, and med management appointments    Valet parking services will be available as well.

## 2024-01-12 NOTE — Progress Notes (Signed)
 01/12/2024 Anna Carr   05-07-1941  [id number]  Primary Physician Gordan Payment., MD Primary Cardiologist: Runell Gess MD Milagros Loll, [place], [place]  HPI:  Anna Carr is a 83 y.o. mildly overweight widowed Caucasian female mother of 2 living children (1 deceased), grandmother of 2 grandchildren who is accompanied by one of her daughters [name] today.  She was referred by Wallis Bamberg, NP in [facility] for evaluation of PAD.  She is a cardiology patient of Dr. Vanetta Shawl.  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  She has a history of treated hypertension, diabetes and hyperlipidemia.  She is never had heart attack or stroke.  She does complain of dyspnea.  She has been on dialysis for last 4 years.  She does have valvular heart disease with echo performed 12/29/2023 revealing normal EF, moderately elevated PA systolic pressure, moderate to severe MR, mild to moderate MS, mild to moderate TR with no evidence of aortic stenosis.  She has had numbness in her legs with fatigue over the last year however in the last week or 2 she has noticed severe limitation of ambulation related to right leg discomfort.  She had Dopplers performed 11/24/2023 revealing a right ABI of 0.29 and a left of 0.53.  She did have a high-frequency signal in her right extrailiac artery and moderate disease in her left SFA.  Her symptoms are lifestyle-limiting.  She wishes to proceed with outpatient peripheral angiography and endovascular therapy.   Current Meds  Medication Sig   ascorbic acid (VITAMIN C) 1000 MG tablet Take 1,000 mg by mouth daily in the afternoon.   aspirin EC 81 MG tablet Take 1 tablet (81 mg total) by mouth daily. Swallow whole.   atorvastatin (LIPITOR) 10 MG tablet Take 1 tablet (10 mg total) by mouth daily.   Biotin 5000 MCG TABS Take 5,000 mcg by mouth daily in the afternoon.   carboxymethylcellulose (REFRESH PLUS) 0.5 % SOLN Place 1 drop into both eyes 3 (three) times daily as needed  (dry eyes).   carvedilol (COREG) 6.25 MG tablet Take 6.25 mg by mouth 2 (two) times daily.   Cyanocobalamin (VITAMIN B-12 PO) Take 1 capsule by mouth every evening. Unknown strength   ferric citrate (AURYXIA) 1 GM 210 MG(Fe) tablet Take 210 mg by mouth 3 (three) times daily with meals.   furosemide (LASIX) 80 MG tablet Take 80 mg by mouth daily.   Insulin Glargine-Lixisenatide (SOLIQUA) 100-33 UNT-MCG/ML SOPN Inject 12 Units into the skin in the morning. (Patient taking differently: Inject 16-18 Units into the skin See admin instructions. 16 units<200 18 units >200)   Lactobacillus (PROBIOTIC ACIDOPHILUS PO) Take 1 capsule by mouth in the morning.   lidocaine-prilocaine (EMLA) cream Apply 1 application topically as needed (prior to fisula access). Tuesday, Thursday, Saturday for dialysis   Omega-3 Fatty Acids (FISH OIL) 1000 MG CAPS Take 1,000 mg by mouth in the morning and at bedtime.   pantoprazole (PROTONIX) 40 MG tablet Take 40 mg by mouth daily.   polyethylene glycol (MIRALAX / GLYCOLAX) 17 g packet Take 17 g by mouth daily as needed for moderate constipation.   Vitamin E 400 units TABS Take 800 Units by mouth in the morning.     No Known Allergies  Social History   Socioeconomic History   Marital status: Widowed    Spouse name: Not on file   Number of children: 3   Years of education: Not on file   Highest education  level: Not on file  Occupational History   Not on file  Tobacco Use   Smoking status: Former    Types: Cigarettes   Smokeless tobacco: Never   Tobacco comments:    quit in 1973  Vaping Use   Vaping status: Never Used  Substance and Sexual Activity   Alcohol use: Not Currently   Drug use: Never   Sexual activity: Not Currently  Other Topics Concern   Not on file  Social History Narrative   Not on file   Social Drivers of Health   Financial Resource Strain: Low Risk  (10/27/2021)   Received from [facility], [facility]   Overall Financial Resource  Strain (CARDIA)    Difficulty of Paying Living Expenses: Not hard at all  Food Insecurity: Low Risk  (05/26/2023)   Received from [facility]   Hunger Vital Sign    Worried About Running Out of Food in the Last Year: Never true    Ran Out of Food in the Last Year: Never true  Transportation Needs: Not on file (05/26/2023)  Physical Activity: Not on file  Stress: No Stress Concern Present (10/27/2021)   Received from [facility], [facility]   [employer] of Occupational Health - Occupational Stress Questionnaire    Feeling of Stress : Not at all  Social Connections: Unknown (03/10/2022)   Received from [facility], [facility]   Social Network    Social Network: Not on file  Intimate Partner Violence: Unknown (01/30/2022)   Received from [facility], [facility]   HITS    Physically Hurt: Not on file    Insult or Talk Down To: Not on file    Threaten Physical Harm: Not on file    Scream or Curse: Not on file     Review of Systems: General: negative for chills, fever, night sweats or weight changes.  Cardiovascular: negative for chest pain, dyspnea on exertion, edema, orthopnea, palpitations, paroxysmal nocturnal dyspnea or shortness of breath Dermatological: negative for rash Respiratory: negative for cough or wheezing Urologic: negative for hematuria Abdominal: negative for nausea, vomiting, diarrhea, bright red blood per rectum, melena, or hematemesis Neurologic: negative for visual changes, syncope, or dizziness All other systems reviewed and are otherwise negative except as noted above.    Blood pressure 134/64, pulse 77, height 5' 1.5" (1.562 m), weight 159 lb (72.1 kg), SpO2 93%.  General appearance: alert and no distress Neck: no adenopathy, no JVD, supple, symmetrical, trachea midline, thyroid not enlarged, symmetric, no tenderness/mass/nodules, and bilateral carotid bruits Lungs: clear to auscultation bilaterally Heart: 2/6 outflow tract murmur  consistent with aortic stenosis. Extremities: extremities normal, atraumatic, no cyanosis or edema Pulses: Diminished pedal pulses Skin: Skin color, texture, turgor normal. No rashes or lesions Neurologic: Grossly normal  EKG not performed today      ASSESSMENT AND PLAN:   Peripheral vascular disease, unspecified ([name]) critical stenosis lower extremity Ms. Taborn was referred to me by Wallis Bamberg, NP in [place] because of symptomatic PAD.  She is a patient of Dr. Vanetta Shawl.  She has multiple comorbidities including treated hypertension, diabetes and hyperlipidemia.  She does have valvular heart disease including mitral stenosis, aortic stenosis and mitral Cedric Fishman.  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  She is complained of leg numbness and pain for the last year on ambulation however in the last week or so this is gotten progressively worse to the point where she is now not able to  ambulate principally because of pain in her right leg.  She had Dopplers performed 11/24/2023 revealing a right ABI of 0.29 and a left of 0.53.  She had a high-frequency signal in her right extrailiac artery with monophasic waveforms below that and high-grade disease in her left SFA .  She wishes to proceed with outpatient peripheral angiography and endovascular therapy for lifestyle-limiting claudication.  Will need to adjust her dialysis schedule to accommodate her procedure.     Runell Gess MD FACP,FACC,FAHA, [facility] 01/12/2024 12:23 PM

## 2024-01-13 ENCOUNTER — Telehealth: Payer: Self-pay | Admitting: Cardiology

## 2024-01-13 ENCOUNTER — Encounter: Payer: Self-pay | Admitting: Cardiovascular Disease

## 2024-01-13 DIAGNOSIS — Z0279 Encounter for issue of other medical certificate: Secondary | ICD-10-CM

## 2024-01-13 LAB — CBC WITH DIFFERENTIAL/PLATELET
Basophils Absolute: 0 10*3/uL (ref 0.0–0.2)
Basos: 1 %
EOS (ABSOLUTE): 0.1 10*3/uL (ref 0.0–0.4)
Eos: 2 %
Hematocrit: 35 % (ref 34.0–46.6)
Hemoglobin: 11.3 g/dL (ref 11.1–15.9)
Immature Grans (Abs): 0 10*3/uL (ref 0.0–0.1)
Immature Granulocytes: 0 %
Lymphocytes Absolute: 0.9 10*3/uL (ref 0.7–3.1)
Lymphs: 16 %
MCH: 32.1 pg (ref 26.6–33.0)
MCHC: 32.3 g/dL (ref 31.5–35.7)
MCV: 99 fL — ABNORMAL HIGH (ref 79–97)
Monocytes Absolute: 0.6 10*3/uL (ref 0.1–0.9)
Monocytes: 10 %
Neutrophils Absolute: 3.9 10*3/uL (ref 1.4–7.0)
Neutrophils: 71 %
Platelets: 208 10*3/uL (ref 150–450)
RBC: 3.52 x10E6/uL — ABNORMAL LOW (ref 3.77–5.28)
RDW: 15.7 % — ABNORMAL HIGH (ref 11.7–15.4)
WBC: 5.5 10*3/uL (ref 3.4–10.8)

## 2024-01-13 LAB — BASIC METABOLIC PANEL
BUN/Creatinine Ratio: 8 — ABNORMAL LOW (ref 12–28)
BUN: 31 mg/dL — ABNORMAL HIGH (ref 8–27)
CO2: 27 mmol/L (ref 20–29)
Calcium: 9.8 mg/dL (ref 8.7–10.3)
Chloride: 90 mmol/L — ABNORMAL LOW (ref 96–106)
Creatinine, Ser: 4.13 mg/dL — ABNORMAL HIGH (ref 0.57–1.00)
Glucose: 165 mg/dL — ABNORMAL HIGH (ref 70–99)
Potassium: 4.1 mmol/L (ref 3.5–5.2)
Sodium: 134 mmol/L (ref 134–144)
eGFR: 10 mL/min/{1.73_m2} — ABNORMAL LOW (ref >59)

## 2024-01-13 NOTE — Telephone Encounter (Signed)
 Forms received from patient's daughter [name] from [employer] for FMLA. Patient paid $29 dollar fee in cash and it is placed in the safe. Took form to MD box for completion. Patient would like the forms faxed but would like to be called at [phone] to be made aware. KBL 01/13/24

## 2024-01-15 LAB — LIPID PANEL
Chol/HDL Ratio: 3.5 ratio (ref 0.0–4.4)
Cholesterol, Total: 145 mg/dL (ref 100–199)
HDL: 42 mg/dL (ref >39)
LDL Chol Calc (NIH): 82 mg/dL (ref 0–99)
Triglycerides: 115 mg/dL (ref 0–149)
VLDL Cholesterol Cal: 21 mg/dL (ref 5–40)

## 2024-01-15 LAB — ALT: ALT: 17 IU/L (ref 0–32)

## 2024-01-15 LAB — AST: AST: 16 IU/L (ref 0–40)

## 2024-01-17 ENCOUNTER — Telehealth: Payer: Self-pay | Admitting: Cardiovascular Disease

## 2024-01-17 ENCOUNTER — Encounter (INDEPENDENT_AMBULATORY_CARE_PROVIDER_SITE_OTHER): Payer: Medicare Other | Admitting: Ophthalmology

## 2024-01-17 NOTE — Telephone Encounter (Signed)
 Attempted to call daughter at the phone number listed on the chart and a lady answered the phone and states she didn't know a person named Alfonzo Feller.  But could have been misdialed so I attempted to call the same number again and this time it went to voicemail and I got a recording saying the mailbox was full and unable to leave a message.

## 2024-01-17 NOTE — Telephone Encounter (Signed)
 Pt daughter calling in regards to results. Please advise

## 2024-01-18 ENCOUNTER — Telehealth: Payer: Self-pay | Admitting: Cardiovascular Disease

## 2024-01-18 NOTE — Telephone Encounter (Signed)
 FMLA paperwork received through pt's chart, per [facility]. Paperwork given to [employer]. for completion by Dr. Allyson Sabal.  Per Domingo Dimes L., pt is aware of possible 14 day process.  JB, 01-18-24

## 2024-01-18 NOTE — Telephone Encounter (Signed)
 Called and left VM for patient to call back to reschedule Dr Allyson Sabal appointment.

## 2024-01-18 NOTE — Telephone Encounter (Signed)
 Attempted to call patient, no answer/unable to leave voicemail.

## 2024-01-18 NOTE — Telephone Encounter (Signed)
 Received call from Alfonzo Feller daughter at [phone] and she states that she is trying to call to obtain the results from the labs that Dr Allyson Sabal and the PCP recently ordered. I explained to her that we have been calling her and her mother for the last two days and have been unsuccessful.  I verified her phone number as above and her mothers home phone number. She states she has cleared out the voicemail messages on moms phone in case we need to call again.  She would like a call back from Dr Allyson Sabal to discuss all labs that were ordered by both providers and also to discuss upcoming schedule surgery as her mother has developed a sore on her foot and she wants to make sure that she is stil la surgical candidate.  Instructed her that I will forward this message to Dr Allyson Sabal for review and call back.

## 2024-01-19 ENCOUNTER — Telehealth: Payer: Self-pay | Admitting: Cardiovascular Disease

## 2024-01-19 NOTE — Telephone Encounter (Signed)
 Returned call to patient's daughter Rosey Bath no answer.Unable to leave a message voice mail full.

## 2024-01-19 NOTE — Telephone Encounter (Signed)
 Pt's daughter would like a c/b regarding pt's lab results as well as make provider aware of a sore that has appeared on pt's foot. Please advise

## 2024-01-20 NOTE — Telephone Encounter (Signed)
 Pt has been rescheduled to see Dr. Allyson Sabal on 5/12.

## 2024-01-20 NOTE — Telephone Encounter (Signed)
 2nd attempt to contact patient. No answer. Left vm

## 2024-01-20 NOTE — Telephone Encounter (Signed)
 Attempted to call pt's daughter back. Voicemail is full.

## 2024-01-25 ENCOUNTER — Other Ambulatory Visit: Payer: Self-pay | Admitting: Cardiovascular Disease

## 2024-01-25 DIAGNOSIS — I739 Peripheral vascular disease, unspecified: Secondary | ICD-10-CM

## 2024-01-26 ENCOUNTER — Ambulatory Visit: Attending: Cardiovascular Disease

## 2024-01-26 DIAGNOSIS — I739 Peripheral vascular disease, unspecified: Secondary | ICD-10-CM

## 2024-01-26 DIAGNOSIS — I6523 Occlusion and stenosis of bilateral carotid arteries: Secondary | ICD-10-CM | POA: Diagnosis not present

## 2024-01-27 ENCOUNTER — Telehealth: Payer: Self-pay | Admitting: Cardiovascular Disease

## 2024-01-27 NOTE — Telephone Encounter (Signed)
 Called pt's daughter, Rosey Bath (ok per [facility]) regarding upcoming procedure. Updated her regarding FMLA paperwork. Dr. Hazle Coca portion has been completed and returned to our [facility] advocate. Daughter would also like to discuss pt's need for dialysis after the procedure. Explained that if in hospital dialysis is needed they can order that at the hospital, otherwise pt will remain on her schedule and have dialysis on Saturday as planned. Daughter also asks that we review medications that need to be held on day of her procedure. Advised daughter that insulin should be held on the day of the procedure. Originally, we discussed holding lasix, however pt does not make any urine so this would not be a concern. Daughter states that she typically takes lasix in the afternoon anyway. Advised daughter to ask for lasix dose if needed after procedure. Daughter has no further questions at this time. She will call back if any arise.

## 2024-01-27 NOTE — Telephone Encounter (Signed)
 Attempted to return call, no answer. Unable to leave message. Voicemail is full.

## 2024-01-27 NOTE — Telephone Encounter (Signed)
 Daughter, Anna Carr, says patient normally has dialysis on Tues/Thursdays. Since procedure is on Thursday, 4/10, dialysis recommends having it done on Wednesday, 4/09, and with the hospital if possible. Please advise.

## 2024-01-27 NOTE — Telephone Encounter (Signed)
 Patient's daughter came in today to check progress of forms, upon reaching out to Dr. Hazle Coca nurse she said they would be completed and faxed by the end of the week/ Patient's daughter was agreeable and had no further questions at this time./kbl 01/27/24

## 2024-01-28 NOTE — Telephone Encounter (Signed)
 Daughter calling to check on FLMA paper. Please advise

## 2024-01-31 ENCOUNTER — Telehealth: Payer: Self-pay

## 2024-01-31 NOTE — Telephone Encounter (Signed)
 Runell Gess, MD 01/28/2024 10:04 AM EDT     Mod Bilat ICA stenosis. Repeat 12 months     Left detailed message (ok per DPR) for pt regarding recent carotid ultrasound results. Call back number left for questions or concerns.

## 2024-01-31 NOTE — Telephone Encounter (Signed)
-----   Message from [name] sent at 01/28/2024 10:04 AM EDT ----- Mod Bilat ICA stenosis. Repeat 12 months

## 2024-02-01 ENCOUNTER — Telehealth: Payer: Self-pay | Admitting: *Deleted

## 2024-02-01 NOTE — Telephone Encounter (Signed)
 Attempted to call daughter (T.Gross): [phone], vmbox full. Left vm on [phone] stating that Dr. Hazle Coca nurse will return to office on 02/08/24 to complete last sheet of FMLA paperwork, received from [facility] ([facility]).   I have paperwork at front desk.  JB, 02-01-24

## 2024-02-01 NOTE — Telephone Encounter (Addendum)
 Lower Extremity Angiogram scheduled at [facility] for: Thursday February 03, 2024 7:30 AM Arrival time [facility] Main Entrance A at: 5:30 AM  Nothing to eat after midnight prior to procedure, clear liquids until 5 AM day of procedure.  Medication instructions: -Hold:  Insulin-AM of procedure  Lasix -AM of procedure -Other usual morning medications can be taken with sips of water including aspirin 81 mg.  Plan to go home the same day, you will only stay overnight if medically necessary.  You must have responsible adult to drive you home.  Someone must be with you the first 24 hours after you arrive home.  Confirmed usual dialysis schedule is Tu-Th-Sat and patient plans for dialysis this week are Tu-Wed-Sat since procedure is Th.   Reviewed procedure instructions with patient's daughter (DPR), [name].

## 2024-02-02 MED ORDER — SODIUM CHLORIDE 0.9 % IV SOLN: INTRAVENOUS | Status: DC

## 2024-02-03 ENCOUNTER — Other Ambulatory Visit: Payer: Self-pay

## 2024-02-03 ENCOUNTER — Encounter (HOSPITAL_COMMUNITY)
Admission: RE | Disposition: A | Discharge status: Home / Self Care | Payer: Self-pay | Source: Home / Self Care | Attending: Cardiovascular Disease

## 2024-02-03 ENCOUNTER — Ambulatory Visit (HOSPITAL_COMMUNITY)
Admission: RE | Admit: 2024-02-03 | Discharge: 2024-02-03 | Disposition: A | Discharge status: Home / Self Care | Attending: Cardiovascular Disease | Admitting: Cardiovascular Disease

## 2024-02-03 DIAGNOSIS — Z794 Long term (current) use of insulin: Secondary | ICD-10-CM | POA: Diagnosis not present

## 2024-02-03 DIAGNOSIS — Z992 Dependence on renal dialysis: Secondary | ICD-10-CM | POA: Insufficient documentation

## 2024-02-03 DIAGNOSIS — E785 Hyperlipidemia, unspecified: Secondary | ICD-10-CM | POA: Insufficient documentation

## 2024-02-03 DIAGNOSIS — E1151 Type 2 diabetes mellitus with diabetic peripheral angiopathy without gangrene: Secondary | ICD-10-CM | POA: Insufficient documentation

## 2024-02-03 DIAGNOSIS — I120 Hypertensive chronic kidney disease with stage 5 chronic kidney disease or end stage renal disease: Secondary | ICD-10-CM | POA: Insufficient documentation

## 2024-02-03 DIAGNOSIS — I739 Peripheral vascular disease, unspecified: Secondary | ICD-10-CM | POA: Diagnosis not present

## 2024-02-03 DIAGNOSIS — E1122 Type 2 diabetes mellitus with diabetic chronic kidney disease: Secondary | ICD-10-CM | POA: Diagnosis not present

## 2024-02-03 DIAGNOSIS — Z79899 Other long term (current) drug therapy: Secondary | ICD-10-CM | POA: Diagnosis not present

## 2024-02-03 DIAGNOSIS — Z87891 Personal history of nicotine dependence: Secondary | ICD-10-CM | POA: Diagnosis not present

## 2024-02-03 DIAGNOSIS — N186 End stage renal disease: Secondary | ICD-10-CM | POA: Insufficient documentation

## 2024-02-03 HISTORY — PX: LOWER EXTREMITY ANGIOGRAPHY: CATH118251

## 2024-02-03 LAB — GLUCOSE, CAPILLARY
Glucose-Capillary: 106 mg/dL — ABNORMAL HIGH (ref 70–99)
Glucose-Capillary: 122 mg/dL — ABNORMAL HIGH (ref 70–99)

## 2024-02-03 SURGERY — LOWER EXTREMITY ANGIOGRAPHY
Anesthesia: LOCAL

## 2024-02-03 MED ORDER — LIDOCAINE HCL (PF) 1 % IJ SOLN
INTRAMUSCULAR | Status: AC
  Filled 2024-02-03: qty 30

## 2024-02-03 MED ORDER — HEPARIN (PORCINE) IN NACL 1000-0.9 UT/500ML-% IV SOLN
INTRAVENOUS | Status: DC | PRN
  Administered 2024-02-03 (×2): 500 mL

## 2024-02-03 MED ORDER — LABETALOL HCL 5 MG/ML IV SOLN: 10.0000 mg | INTRAVENOUS | Status: DC | PRN

## 2024-02-03 MED ORDER — SODIUM CHLORIDE 0.9% FLUSH: 3.0000 mL | INTRAVENOUS | Status: DC | PRN

## 2024-02-03 MED ORDER — IODIXANOL 320 MG/ML IV SOLN
INTRAVENOUS | Status: DC | PRN
  Administered 2024-02-03: 120 mL

## 2024-02-03 MED ORDER — SODIUM CHLORIDE 0.9 % IV SOLN: 250.0000 mL | INTRAVENOUS | Status: DC | PRN

## 2024-02-03 MED ORDER — SODIUM CHLORIDE 0.9 % IV SOLN: INTRAVENOUS | Status: DC

## 2024-02-03 MED ORDER — ATORVASTATIN CALCIUM 10 MG PO TABS: 10.0000 mg | ORAL_TABLET | Freq: Every day | ORAL | Status: DC

## 2024-02-03 MED ORDER — PANTOPRAZOLE SODIUM 40 MG PO TBEC: 40.0000 mg | DELAYED_RELEASE_TABLET | Freq: Every day | ORAL | Status: DC

## 2024-02-03 MED ORDER — ONDANSETRON HCL 4 MG/2ML IJ SOLN: 4.0000 mg | Freq: Four times a day (QID) | INTRAMUSCULAR | Status: DC | PRN

## 2024-02-03 MED ORDER — NITROGLYCERIN 1 MG/10 ML FOR IR/CATH LAB
INTRA_ARTERIAL | Status: AC
  Filled 2024-02-03: qty 10

## 2024-02-03 MED ORDER — ALBUTEROL SULFATE (2.5 MG/3ML) 0.083% IN NEBU: 2.5000 mg | INHALATION_SOLUTION | Freq: Once | RESPIRATORY_TRACT | Status: DC

## 2024-02-03 MED ORDER — FUROSEMIDE 40 MG PO TABS: 80.0000 mg | ORAL_TABLET | Freq: Every day | ORAL | Status: DC

## 2024-02-03 MED ORDER — CARVEDILOL 6.25 MG PO TABS: 6.2500 mg | ORAL_TABLET | Freq: Two times a day (BID) | ORAL | Status: DC

## 2024-02-03 MED ORDER — FLUTICASONE PROPIONATE 50 MCG/ACT NA SUSP
1.0000 | Freq: Every day | NASAL | Status: DC | PRN
  Administered 2024-02-03: 1 via NASAL
  Filled 2024-02-03: qty 16

## 2024-02-03 MED ORDER — NITROGLYCERIN 1 MG/10 ML FOR IR/CATH LAB
INTRA_ARTERIAL | Status: DC | PRN
  Administered 2024-02-03: 200 ug via INTRA_ARTERIAL

## 2024-02-03 MED ORDER — HEPARIN SODIUM (PORCINE) 1000 UNIT/ML IJ SOLN
INTRAMUSCULAR | Status: AC
  Filled 2024-02-03: qty 10

## 2024-02-03 MED ORDER — HYDRALAZINE HCL 20 MG/ML IJ SOLN: 5.0000 mg | INTRAMUSCULAR | Status: DC | PRN

## 2024-02-03 MED ORDER — INSULIN GLARGINE-LIXISENATIDE 100-33 UNT-MCG/ML ~~LOC~~ SOPN: 12.0000 [IU] | PEN_INJECTOR | Freq: Every morning | SUBCUTANEOUS | Status: DC

## 2024-02-03 MED ORDER — LIDOCAINE HCL (PF) 1 % IJ SOLN
INTRAMUSCULAR | Status: DC | PRN
  Administered 2024-02-03: 15 mL via INTRADERMAL

## 2024-02-03 MED ORDER — ACETAMINOPHEN 325 MG PO TABS: 650.0000 mg | ORAL_TABLET | ORAL | Status: DC | PRN

## 2024-02-03 MED ORDER — ASPIRIN 81 MG PO TBEC: 81.0000 mg | DELAYED_RELEASE_TABLET | Freq: Every day | ORAL | Status: DC

## 2024-02-03 MED ORDER — SODIUM CHLORIDE 0.9% FLUSH: 3.0000 mL | Freq: Two times a day (BID) | INTRAVENOUS | Status: DC

## 2024-02-03 MED ORDER — MORPHINE SULFATE (PF) 2 MG/ML IV SOLN: 2.0000 mg | INTRAVENOUS | Status: DC | PRN

## 2024-02-03 MED ORDER — POLYETHYLENE GLYCOL 3350 17 G PO PACK: 17.0000 g | PACK | Freq: Every day | ORAL | Status: DC | PRN

## 2024-02-03 SURGICAL SUPPLY — 12 items
BAG SNAP BAND KOVER 36X36 (MISCELLANEOUS) IMPLANT
CATH ANGIO 5F BER2 65CM (CATHETERS) IMPLANT
CATH ANGIO 5F PIGTAIL 65CM (CATHETERS) IMPLANT
CATH STRAIGHT 5FR 65CM (CATHETERS) IMPLANT
COVER DOME SNAP 22 D (MISCELLANEOUS) IMPLANT
KIT MICROPUNCTURE NIT STIFF (SHEATH) IMPLANT
KIT SYRINGE INJ CVI SPIKEX1 (MISCELLANEOUS) IMPLANT
SET ATX-X65L (MISCELLANEOUS) IMPLANT
SHEATH PINNACLE 5F 10CM (SHEATH) IMPLANT
SHEATH PROBE COVER 6X72 (BAG) IMPLANT
TRAY PV CATH (CUSTOM PROCEDURE TRAY) ×1 IMPLANT
WIRE HITORQ VERSACORE ST 145CM (WIRE) IMPLANT

## 2024-02-03 NOTE — Discharge Instructions (Signed)
 Femoral Site Care This sheet gives you information about how to care for yourself after your procedure. Your health care provider may also give you more specific instructions. If you have problems or questions, contact your health care provider. What can I expect after the procedure?  After the procedure, it is common to have: Bruising that usually fades within 1-2 weeks. Tenderness at the site. Follow these instructions at home: Wound care Follow instructions from your health care provider about how to take care of your insertion site. Make sure you: Wash your hands with soap and water before you change your bandage (dressing). If soap and water are not available, use hand sanitizer. Remove your dressing as told by your health care provider. 24 hours Do not take baths, swim, or use a hot tub until your health care provider approves. You may shower 24-48 hours after the procedure or as told by your health care provider. Gently wash the site with plain soap and water. Pat the area dry with a clean towel. Do not rub the site. This may cause bleeding. Do not apply powder or lotion to the site. Keep the site clean and dry. Check your femoral site every day for signs of infection. Check for: Redness, swelling, or pain. Fluid or blood. Warmth. Pus or a bad smell. Activity For the first 2-3 days after your procedure, or as long as directed: Avoid climbing stairs as much as possible. Do not squat. Do not lift anything that is heavier than 10 lb (4.5 kg), or the limit that you are told, until your health care provider says that it is safe. For 5 days Rest as directed. Avoid sitting for a long time without moving. Get up to take short walks every 1-2 hours. Do not drive for 24 hours if you were given a medicine to help you relax (sedative). General instructions Take over-the-counter and prescription medicines only as told by your health care provider. Keep all follow-up visits as told by your  health care provider. This is important. Contact a health care provider if you have: A fever or chills. You have redness, swelling, or pain around your insertion site. Get help right away if: The catheter insertion area swells very fast. You Lwin out. You suddenly start to sweat or your skin gets clammy. The catheter insertion area is bleeding, and the bleeding does not stop when you hold steady pressure on the area. The area near or just beyond the catheter insertion site becomes pale, cool, tingly, or numb. These symptoms may represent a serious problem that is an emergency. Do not wait to see if the symptoms will go away. Get medical help right away. Call your local emergency services (911 in the [place] Do not drive yourself to the hospital. Summary After the procedure, it is common to have bruising that usually fades within 1-2 weeks. Check your femoral site every day for signs of infection. Do not lift anything that is heavier than 10 lb (4.5 kg), or the limit that you are told, until your health care provider says that it is safe. This information is not intended to replace advice given to you by your health care provider. Make sure you discuss any questions you have with your health care provider. Document Revised: 10/25/2017 Document Reviewed: 10/25/2017 Elsevier Patient Education  2020 [employer].

## 2024-02-03 NOTE — Interval H&P Note (Signed)
 History and Physical Interval Note:  02/03/2024 7:34 AM  Anna Carr  has presented today for surgery, with the diagnosis of pad.  The various methods of treatment have been discussed with the patient and family. After consideration of risks, benefits and other options for treatment, the patient has consented to  Procedure(s): Lower Extremity Angiography (N/A) as a surgical intervention.  The patient's history has been reviewed, patient examined, no change in status, stable for surgery.  I have reviewed the patient's chart and labs.  Questions were answered to the patient's satisfaction.     [name]

## 2024-02-03 NOTE — Progress Notes (Signed)
 Site area: 50F right femoral arterial sheath Site Prior to Removal:  Level 0 Pressure Applied For: 25 minutes Manual:   yes Patient Status During Pull:  stable Post Pull Site:  Level 0 Post Pull Instructions Given:  yes Post Pull Pulses Present: rt dp dopplered Dressing Applied:  gauze and tegaderm Bedrest begins @ 0920 Comments:

## 2024-02-04 ENCOUNTER — Encounter (HOSPITAL_COMMUNITY): Payer: Self-pay | Admitting: Cardiovascular Disease

## 2024-02-07 ENCOUNTER — Telehealth: Payer: Self-pay | Admitting: Cardiology

## 2024-02-07 NOTE — Telephone Encounter (Signed)
 Left message for pt to call.

## 2024-02-07 NOTE — Telephone Encounter (Signed)
 Per afterhours report, pt called and stated that she feels a little tender after the surgery and wanted to know if that is normal.  Best number [phone]

## 2024-02-08 ENCOUNTER — Telehealth: Payer: Self-pay | Admitting: Cardiovascular Disease

## 2024-02-08 NOTE — Telephone Encounter (Signed)
 Spoke with pt's daughter Anna Carr (ok per DPR), regarding FMLA paperwork and another call made in regards to her procedure insertion site. Anna Carr states that there is another page of paperwork that needs to be filled out, advised that we can take care of that for her. She does request a call back once this is complete. Inquired about pt, Anna Carr says that she is recovering well. Pt did have dialysis today and is napping at the time of the phone call. Will follow back up with Anna Carr after paperwork has been addressed. No further concerns at this time.

## 2024-02-08 NOTE — Telephone Encounter (Signed)
 Pt's daughter Ammon Bales calling to check if we rec'vd fmla forms, page that didn't get filled out/received, requesting cb

## 2024-02-08 NOTE — Telephone Encounter (Signed)
 Please call daughter needs call back asap [phone]

## 2024-02-08 NOTE — Telephone Encounter (Signed)
 Spoke with pt's daughter. See additional phone encounter for more details. Closing this encounter.

## 2024-02-08 NOTE — Telephone Encounter (Signed)
 Please return call as the pt called back.

## 2024-02-08 NOTE — Telephone Encounter (Signed)
 Attempted to call patient, no answer left message requesting a call back.

## 2024-02-09 NOTE — Telephone Encounter (Signed)
 FMLA received from coordinator. Additional page completed and returned to [facility] coordinator.

## 2024-02-10 NOTE — Telephone Encounter (Signed)
 Daughter Ammon Bales) called to follow-up on patient's FMLA document.  Daughter stated patient's work office has not received the document.  Fax# [phone]. Caller stated the fax needs to include the following: her name Mosie Argue, WIN# [id number], Claim# [id number].  Caller also requested a copy be emailed to her at email:  [email or web address].

## 2024-02-10 NOTE — Telephone Encounter (Signed)
 Spoke w/pt's daughter, Ammon Bales - informed her that forms have been faxed to [facility] (original copies were initially sent) & Alyce Baba [phone]). Form has been emailed to [email or web address] as instructed in previous tele encounter.  JB, 02-10-24

## 2024-02-11 ENCOUNTER — Telehealth: Payer: Self-pay | Admitting: Cardiovascular Disease

## 2024-02-11 ENCOUNTER — Encounter: Payer: Self-pay | Admitting: Cardiology

## 2024-02-11 NOTE — Telephone Encounter (Signed)
 Left message for pt to call.

## 2024-02-11 NOTE — Telephone Encounter (Signed)
 Spoke with pt daughter, she reports since 02/06/24 the patient has had swelling in her feet and legs on non-dialysis days. She is not there in the morning but today by midday they are swollen. If the patient elevated her legs, the swelling will go down. She is watching her fluids and is having no SOB. The patient is not complaining with pain or anything regarding the legs, the daughter is concerned about it. She is also going to let the dialysis folks know. Aware her heart muscle is strong so do not feel related to heart. Her follow up appointment with dr berry is 03/09/24. She would like dr berry to know what is going on and get his recommendations.

## 2024-02-11 NOTE — Telephone Encounter (Signed)
 I would probably discuss with Dr. Krasowski when she sees him but it does not sound like it is cardiac in nature.  She does have pretty significantly reduced flow in one of the leg arteries as discussed with Dr. Court at her last procedure, but that would not necessarily explain swelling.  What does mean is that we probably would want to avoid tight compression stockings and would simply recommend foot elevation on nondialysis days.  Would monitor salt intake and discuss volume levels with nephrologist.  [facility]

## 2024-02-11 NOTE — Telephone Encounter (Signed)
 Error

## 2024-02-11 NOTE — Telephone Encounter (Signed)
 Pt c/o swelling/edema: STAT if pt has developed SOB within 24 hours  If swelling, where is the swelling located?   Left leg  How much weight have you gained and in what time span?     Have you gained 2 pounds in a day or 5 pounds in a week?     Do you have a log of your daily weights (if so, list)?   Are you currently taking a fluid pill?   Yes  Are you currently SOB?   No more than normal  Have you traveled recently in a car or plane for an extended period of time?    Daughter stated patient has dialysis 3 times a week and has noticed patient having more swelling than usually in her "good" leg since she had her procedure.  Daughter is concerned patient fell today due to leg swelling and wants a call back to discuss next steps.

## 2024-02-15 ENCOUNTER — Telehealth: Payer: Self-pay | Admitting: Cardiovascular Disease

## 2024-02-15 NOTE — Telephone Encounter (Signed)
 Spoke to Axel Bohr PA at [facility] wanting to speak to [facility] about patient that is in their ED.Advised Dr.Berry is in clinic.Message gave to Dr.Berry's RN.

## 2024-02-15 NOTE — Telephone Encounter (Signed)
 Attempted to call Axel Bohr, PA back x2. Phone continues to ring, no voicemail.

## 2024-02-15 NOTE — Telephone Encounter (Signed)
 Anna Carr is calling on behalf of Dr. Axel Bohr with [facility]. She reports he is wanting to speak with him directly regarding the plan of care since her procedure. Please advise.

## 2024-02-16 DIAGNOSIS — I350 Nonrheumatic aortic (valve) stenosis: Secondary | ICD-10-CM | POA: Diagnosis not present

## 2024-02-16 DIAGNOSIS — I361 Nonrheumatic tricuspid (valve) insufficiency: Secondary | ICD-10-CM

## 2024-02-16 DIAGNOSIS — I342 Nonrheumatic mitral (valve) stenosis: Secondary | ICD-10-CM | POA: Diagnosis not present

## 2024-02-16 DIAGNOSIS — I340 Nonrheumatic mitral (valve) insufficiency: Secondary | ICD-10-CM | POA: Diagnosis not present

## 2024-02-16 NOTE — Telephone Encounter (Signed)
 Pt daughter called in asking to speak with someone about her FMLA papers

## 2024-02-17 ENCOUNTER — Telehealth: Payer: Self-pay | Admitting: Cardiovascular Disease

## 2024-02-17 NOTE — Telephone Encounter (Signed)
 Daughter Ammon Bales) stated she will need the last page of the FMLA paperwork updated to have the "Frequency" read "8 hours as needed" and "Episodes" to read "As Needed".

## 2024-02-17 NOTE — Telephone Encounter (Signed)
 Left voicemail to return call to office

## 2024-02-18 NOTE — Telephone Encounter (Signed)
 Daughter is calling back because she states no one called her back. Please advise

## 2024-02-18 NOTE — Telephone Encounter (Signed)
 Patient identification verified by 2 forms. Hilton Lucky, RN    Called and spoke to patients daughter Monette Angus states:   -would like form updated   -when completed would like form emailed to her as well  Informed Teressa requests have been sent to appropriate party, it will take a few days for request to be completed, she will be outreached once completed  Teressa Verbalized understanding, no questions at this time

## 2024-02-21 ENCOUNTER — Telehealth: Payer: Self-pay | Admitting: Cardiology

## 2024-02-21 NOTE — Telephone Encounter (Signed)
 Daughter Ammon Bales) called back stating [facility] will need a request to release patient's heart echocardiogram and MRI test results sent to fax# [phone].  Daughter noted patient was hospitalized 4/22-26.  Daughter stated she will also need her FMLA paperwork from Dr. Katheryne Pane updated.

## 2024-02-21 NOTE — Telephone Encounter (Signed)
 Pt c/o medication issue:  1. Name of Medication: Pregabalin 75 mg   2. How are you currently taking this medication (dosage and times per day)? Not currently taking   3. Are you having a reaction (difficulty breathing--STAT)? No   4. What is your medication issue? Patient's daughter is calling stating the hospital prescribed this medication to be taken at night. The patient has not started taking it due to being fearful it could spike her sugar. They are wanting confirmation on patient starting this medication. Daughter request a VM be left if the patient does not answer when calling back.   She also reports that patient is not doing well since discharge. She states she is weak and having trouble walking. The patient's right leg falls asleep and was the cause of the fall that she was admitted for.

## 2024-02-21 NOTE — Telephone Encounter (Signed)
 Spoke with daughter regarding appt and hospitalization. Pt has follow up appt on Wednesday. Recommended that she bring all of her medications when she comes in for her appt. Daughter verbalized understanding and had no further questions.

## 2024-02-22 NOTE — Telephone Encounter (Signed)
 Call returned in additional phone encounter.

## 2024-02-22 NOTE — Telephone Encounter (Signed)
 Per Dr Arlester Ladd team the last page didn't need to be altered.  I called the patient's daughter and left a message on her voicemail explaining that Dr. Katheryne Pane had reviewed the last page and decided not to alter it, if she has any questions to call the [place] office.   KBL 02/22/24

## 2024-02-23 ENCOUNTER — Ambulatory Visit: Admitting: Cardiovascular Disease

## 2024-02-23 ENCOUNTER — Ambulatory Visit: Attending: Cardiology | Admitting: Cardiology

## 2024-02-23 ENCOUNTER — Encounter: Payer: Self-pay | Admitting: Cardiology

## 2024-02-23 VITALS — BP 110/48 | HR 68 | Ht 61.5 in | Wt 161.4 lb

## 2024-02-23 DIAGNOSIS — I340 Nonrheumatic mitral (valve) insufficiency: Secondary | ICD-10-CM | POA: Diagnosis not present

## 2024-02-23 DIAGNOSIS — Z992 Dependence on renal dialysis: Secondary | ICD-10-CM

## 2024-02-23 DIAGNOSIS — N186 End stage renal disease: Secondary | ICD-10-CM | POA: Diagnosis not present

## 2024-02-23 DIAGNOSIS — I739 Peripheral vascular disease, unspecified: Secondary | ICD-10-CM | POA: Diagnosis not present

## 2024-02-23 DIAGNOSIS — E785 Hyperlipidemia, unspecified: Secondary | ICD-10-CM

## 2024-02-23 DIAGNOSIS — E1169 Type 2 diabetes mellitus with other specified complication: Secondary | ICD-10-CM

## 2024-02-23 NOTE — Patient Instructions (Signed)
 Medication Instructions:  Your physician recommends that you continue on your current medications as directed. Please refer to the Current Medication list given to you today.  *If you need a refill on your cardiac medications before your next appointment, please call your pharmacy*   Lab Work: None ordered If you have labs (blood work) drawn today and your tests are completely normal, you will receive your results only by: MyChart Message (if you have MyChart) OR A paper copy in the mail If you have any lab test that is abnormal or we need to change your treatment, we will call you to review the results.   Testing/Procedures: None ordered   Follow-Up: At [facility], you and your health needs are our priority.  As part of our continuing mission to provide you with exceptional heart care, we have created designated Provider Care Teams.  These Care Teams include your primary Cardiologist (physician) and Advanced Practice Providers (APPs -  Physician Assistants and Nurse Practitioners) who all work together to provide you with the care you need, when you need it.  We recommend signing up for the patient portal called "MyChart".  Sign up information is provided on this After Visit Summary.  MyChart is used to connect with patients for Virtual Visits (Telemedicine).  Patients are able to view lab/test results, encounter notes, upcoming appointments, etc.  Non-urgent messages can be sent to your provider as well.   To learn more about what you can do with MyChart, go to [email or web address].    Your next appointment:   1 week(s)  The format for your next appointment:   In Person  Provider:   Ralene Burger, MD    Other Instructions none  Important Information About Sugar

## 2024-02-24 NOTE — Progress Notes (Signed)
 Cardiology Office Note:    Date:  02/24/2024   ID:  Anna Carr, DOB Jul 17, 1941, MRN [id number]  PCP:  Abbe Hoard., MD  Cardiologist:  Ralene Burger, MD    Referring MD: Abbe Hoard., MD   Chief Complaint  Patient presents with   Hospitalization Follow-up    History of Present Illness:    Anna Carr is a 83 y.o. 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  She comes today to months for follow-up.  Overall she is not doing well.  Apparently she took Lyrica yesterday first time and she feels absolutely awful.  Likely her vital signs are fine.  She says she is gradually better but not up to the poor.  Also recently in the being in the hospital and recent hospitalization with multiple nonspecific complaints, eventually ended up being discharged home.  Echocardiogram done in the hospital showed moderate mitral regurgitation.  Comes today for follow-up but again does not feel well yesterday she took Lyrica  Past Medical History:  Diagnosis Date   Anemia, chronic disease 01/23/2017   Arteriovenous fistula, acquired ([name]) 11/02/2019   AVM (arteriovenous malformation)    C. 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 INVASIVE CV LAB;  Service: Cardiovascular;  Laterality: Left;   APPENDECTOMY     AV FISTULA PLACEMENT Left    AV FISTULA PLACEMENT Left 03/12/2021   Procedure: INSERTION OF LEFT UPPER ARM ARTERIOVENOUS (AV) GORE-TEX GRAFT;  Surgeon: Adine Hoof, MD;  Location: [facility] OR;  Service: Vascular;  Laterality: Left;   COLONOSCOPY WITH PROPOFOL  N/A 03/18/2021   Procedure: COLONOSCOPY WITH PROPOFOL ;  Surgeon: Albertina Hugger, MD;  Location: [facility] ENDOSCOPY;  Service: Gastroenterology;  Laterality: N/A;   CYSTECTOMY     ENTEROSCOPY N/A 03/21/2021   Procedure: ENTEROSCOPY;  Surgeon: Albertina Hugger, MD;  Location: [facility] ENDOSCOPY;  Service: Gastroenterology;  Laterality: N/A;   ENTEROSCOPY N/A 12/20/2021   Procedure: ENTEROSCOPY;  Surgeon: Albertina Hugger, MD;  Location[facility] ENDOSCOPY;  Service: Gastroenterology;  Laterality: N/A;   ESOPHAGOGASTRODUODENOSCOPY (EGD) WITH PROPOFOL  N/A 03/18/2021   Procedure: ESOPHAGOGASTRODUODENOSCOPY (EGD) WITH PROPOFOL ;  Surgeon: Albertina Hugger, MD;  Location: [facility] ENDOSCOPY;  Service: Gastroenterology;  Laterality: N/A;   GIVENS CAPSULE STUDY N/A 03/19/2021   Procedure: GIVENS CAPSULE STUDY;  Surgeon: Albertina Hugger, MD;  Location: [facility] ENDOSCOPY;  Service: Gastroenterology;  Laterality: N/A;   HEMOSTASIS CLIP PLACEMENT  12/20/2021   Procedure: HEMOSTASIS CLIP PLACEMENT;  Surgeon: Albertina Hugger, MD;  Location: [facility] ENDOSCOPY;  Service: Gastroenterology;;   HOT HEMOSTASIS N/A 03/21/2021   Procedure: HOT HEMOSTASIS (ARGON PLASMA COAGULATION/BICAP);  Surgeon: Albertina Hugger, MD;  Location: [facility] ENDOSCOPY;  Service: Gastroenterology;  Laterality: N/A;   HOT HEMOSTASIS N/A 12/20/2021   Procedure: HOT HEMOSTASIS (ARGON PLASMA COAGULATION/BICAP);  Surgeon: Albertina Hugger, MD;  Location: [facility] ENDOSCOPY;  Service: Gastroenterology;  Laterality: N/A;   HYSTEROSCOPY     LOWER EXTREMITY ANGIOGRAPHY N/A 02/03/2024   Procedure: Lower Extremity Angiography;  Surgeon: Avanell Leigh, MD;  Location: [facility] INVASIVE CV LAB;  Service: Cardiovascular;  Laterality: N/A;   POLYPECTOMY  03/18/2021   Procedure: POLYPECTOMY;  Surgeon: Albertina Hugger, MD;  Location: [facility] ENDOSCOPY;  Service: Gastroenterology;;    Current Medications: Current Meds  Medication Sig   acetaminophen  (TYLENOL ) 500 MG tablet Take 500 mg by mouth every 6 (six) hours as needed for mild pain (pain score 1-3) or moderate pain (pain score 4-6).   ascorbic acid (VITAMIN C) 1000 MG tablet Take 1,000 mg by mouth daily in the afternoon.   aspirin  EC 81 MG tablet Take 1 tablet (81 mg total) by mouth daily. Swallow whole.   atorvastatin  (LIPITOR) 10 MG tablet Take 1 tablet (10 mg total) by mouth daily.   Biotin 5000 MCG TABS Take 5,000 mcg by mouth daily in the afternoon.   carboxymethylcellulose (REFRESH PLUS) 0.5 % SOLN Place 1 drop into both eyes 3 (three) times daily as needed (dry eyes).   carvedilol  (COREG ) 6.25 MG tablet Take 6.25 mg by mouth 2 (two) times daily.   Cyanocobalamin (VITAMIN B-12 PO) Take 1 capsule by mouth every evening. 50 mg   ferric citrate  (AURYXIA ) 1 GM 210 MG(Fe) tablet Take 210 mg by mouth 3 (three) times daily with meals.   fluticasone  (FLONASE ) 50 MCG/ACT nasal spray Place 1 spray into both nostrils daily as needed (Congestion).   furosemide  (LASIX ) 80 MG tablet Take 80 mg by mouth daily.   Insulin  Glargine-Lixisenatide  (SOLIQUA ) 100-33 UNT-MCG/ML SOPN Inject 12 Units into the skin in the morning. (Patient taking differently: Inject 12 Units into the skin See admin instructions. 16  units<200 18 units >200)   Lactobacillus (PROBIOTIC ACIDOPHILUS PO) Take 1 capsule by mouth in the morning.   lidocaine -prilocaine  (EMLA ) cream Apply 1 application topically as needed (prior to fisula access). Tuesday, Thursday, Saturday for dialysis   Omega-3 Fatty Acids (FISH OIL PO) Take 2,400 mg by mouth in the morning and at bedtime.   pantoprazole  (PROTONIX ) 40 MG tablet Take 40 mg by mouth daily.   polyethylene glycol (MIRALAX  / GLYCOLAX ) 17 g packet Take 17 g by mouth daily as needed for moderate constipation.   pregabalin (LYRICA) 75 MG capsule Take 75 mg by mouth daily.   Spacer/Aero-Holding Chambers (AEROCHAMBER MV) inhaler 1 each by Other route every 4 (four) hours as needed for other (for use with albuterol  inhaler).   Vitamin E 670 MG (1000 UT) CAPS Take 1,000 Units by mouth in the morning.     Allergies:   Patient has no known allergies.   Social History   Socioeconomic History   Marital status: Widowed    Spouse name: Not on file   Number of children: 3   Years of education: Not on file   Highest education level: Not on file  Occupational  History   Not on file  Tobacco Use   Smoking status: Former    Types: Cigarettes   Smokeless tobacco: Never   Tobacco comments:    quit in 1973  Vaping Use   Vaping status: Never Used  Substance and Sexual Activity   Alcohol  use: Not Currently   Drug use: Never   Sexual activity: Not Currently  Other Topics Concern   Not on file  Social History Narrative   Not on file   Social Drivers of Health   Financial Resource Strain: Low Risk  (10/27/2021)   Received from [facility], [facility]   Overall Financial Resource Strain (CARDIA)    Difficulty of Paying Living Expenses: Not hard at all  Food Insecurity: Low Risk  (05/26/2023)   Received from [facility]   Hunger Vital Sign    Worried About Running Out of Food in the Last Year: Never true    Ran Out of Food in the Last Year: Never true  Transportation Needs: Not  on file (05/26/2023)  Physical Activity: Not on file  Stress: No Stress Concern Present (10/27/2021)   Received from [facility], [facility]   [employer] of Occupational Health - Occupational Stress Questionnaire    Feeling of Stress : Not at all  Social Connections: Unknown (03/10/2022)   Received from [facility], [facility]   Social Network    Social Network: Not on file     Family History: The patient's family history includes Diabetes in her maternal grandfather and mother; Hypertension in her mother; Liver disease in an other family member. ROS:   Please see the history of present illness.    All 14 point review of systems negative except as described per history of present illness  EKGs/Labs/Other Studies Reviewed:         Recent Labs: 01/12/2024: BUN 31; Creatinine, Ser 4.13; Hemoglobin 11.3; Platelets 208; Potassium 4.1; Sodium 134 01/14/2024: ALT 17  Recent Lipid Panel    Component Value Date/Time   CHOL 145 01/14/2024 1028   TRIG 115 01/14/2024 1028   HDL 42 01/14/2024 1028   CHOLHDL 3.5 01/14/2024 1028   CHOLHDL 4.4 03/31/2021 0428   VLDL 37 03/31/2021 0428   LDLCALC 82 01/14/2024 1028    Physical Exam:    VS:  BP (!) 110/48 (BP Location: Right Arm, Patient Position: Sitting)   Pulse 68   Ht 5' 1.5" (1.562 m)   Wt 161 lb 6.4 oz (73.2 kg)   SpO2 94%   BMI 30.00 kg/m     Wt Readings from Last 3 Encounters:  02/23/24 161 lb 6.4 oz (73.2 kg)  02/03/24 154 lb 9.6 oz (70.1 kg)  01/12/24 159 lb (72.1 kg)     GEN:  Well nourished, well developed in no acute distress HEENT: Normal NECK: No JVD; No carotid bruits LYMPHATICS: No lymphadenopathy CARDIAC: RRR, no murmurs, no rubs, no gallops RESPIRATORY:  Clear to auscultation without rales, wheezing or rhonchi  ABDOMEN: Soft, non-tender, non-distended MUSCULOSKELETAL:  No edema; No deformity  SKIN: Warm and dry LOWER EXTREMITIES: no swelling NEUROLOGIC:  Alert and oriented x 3 PSYCHIATRIC:   Normal affect   ASSESSMENT:    1. Nonrheumatic mitral valve regurgitation   2. Type 2 diabetes mellitus with hyperlipidemia ([name])   3. Stage 5 chronic kidney disease on chronic dialysis ([name])   4. ESRD on hemodialysis ([name])   5. Peripheral vascular disease, unspecified ([name]) critical stenosis lower extremity    PLAN:  In order of problems listed above:  Nonrheumatic mitral regurgitation.  She appears to be only mildly decompensated on physical exam, however she looks kind of pale.  She does have anemia which can contribute to her symptomatology.  We ask her to have CBC done.  She Dialysis tomorrow.  Will consider intervention first we will try to stabilize her with medications Type 2 diabetes that being followed by antimedicine team. End-stage renal disease, on dialysis followed by neurology.   Medication Adjustments/Labs and Tests Ordered: Current medicines are reviewed at length with the patient today.  Concerns regarding medicines are outlined above.  No orders of the defined types were placed in this encounter.  Medication changes: No orders of the defined types were placed in this encounter.   Signed, Manfred Seed, MD, [facility] 02/24/2024 8:44 AM    [facility]

## 2024-02-25 ENCOUNTER — Inpatient Hospital Stay (HOSPITAL_COMMUNITY)
Admission: EM | Admit: 2024-02-25 | Discharge: 2024-03-11 | DRG: 377 | Disposition: A | Discharge status: Home Health | Attending: Infectious Diseases | Admitting: Infectious Diseases

## 2024-02-25 ENCOUNTER — Ambulatory Visit (HOSPITAL_COMMUNITY)

## 2024-02-25 ENCOUNTER — Other Ambulatory Visit: Payer: Self-pay

## 2024-02-25 ENCOUNTER — Encounter (HOSPITAL_COMMUNITY)

## 2024-02-25 DIAGNOSIS — Z7982 Long term (current) use of aspirin: Secondary | ICD-10-CM

## 2024-02-25 DIAGNOSIS — K5520 Angiodysplasia of colon without hemorrhage: Secondary | ICD-10-CM

## 2024-02-25 DIAGNOSIS — I2510 Atherosclerotic heart disease of native coronary artery without angina pectoris: Secondary | ICD-10-CM | POA: Diagnosis present

## 2024-02-25 DIAGNOSIS — K644 Residual hemorrhoidal skin tags: Secondary | ICD-10-CM | POA: Diagnosis present

## 2024-02-25 DIAGNOSIS — J9811 Atelectasis: Secondary | ICD-10-CM | POA: Diagnosis not present

## 2024-02-25 DIAGNOSIS — Z952 Presence of prosthetic heart valve: Secondary | ICD-10-CM

## 2024-02-25 DIAGNOSIS — M8580 Other specified disorders of bone density and structure, unspecified site: Secondary | ICD-10-CM | POA: Diagnosis present

## 2024-02-25 DIAGNOSIS — Z860100 Personal history of colon polyps, unspecified: Secondary | ICD-10-CM

## 2024-02-25 DIAGNOSIS — Z794 Long term (current) use of insulin: Secondary | ICD-10-CM

## 2024-02-25 DIAGNOSIS — D696 Thrombocytopenia, unspecified: Secondary | ICD-10-CM | POA: Diagnosis not present

## 2024-02-25 DIAGNOSIS — R0789 Other chest pain: Secondary | ICD-10-CM

## 2024-02-25 DIAGNOSIS — Z87891 Personal history of nicotine dependence: Secondary | ICD-10-CM

## 2024-02-25 DIAGNOSIS — Z9181 History of falling: Secondary | ICD-10-CM

## 2024-02-25 DIAGNOSIS — K31811 Angiodysplasia of stomach and duodenum with bleeding: Principal | ICD-10-CM | POA: Diagnosis present

## 2024-02-25 DIAGNOSIS — E11649 Type 2 diabetes mellitus with hypoglycemia without coma: Secondary | ICD-10-CM | POA: Diagnosis not present

## 2024-02-25 DIAGNOSIS — E785 Hyperlipidemia, unspecified: Secondary | ICD-10-CM | POA: Diagnosis present

## 2024-02-25 DIAGNOSIS — Z860101 Personal history of adenomatous and serrated colon polyps: Secondary | ICD-10-CM

## 2024-02-25 DIAGNOSIS — D631 Anemia in chronic kidney disease: Secondary | ICD-10-CM | POA: Diagnosis present

## 2024-02-25 DIAGNOSIS — D539 Nutritional anemia, unspecified: Secondary | ICD-10-CM | POA: Diagnosis not present

## 2024-02-25 DIAGNOSIS — M25512 Pain in left shoulder: Secondary | ICD-10-CM

## 2024-02-25 DIAGNOSIS — I9581 Postprocedural hypotension: Secondary | ICD-10-CM | POA: Diagnosis not present

## 2024-02-25 DIAGNOSIS — E875 Hyperkalemia: Secondary | ICD-10-CM | POA: Diagnosis not present

## 2024-02-25 DIAGNOSIS — K5909 Other constipation: Secondary | ICD-10-CM | POA: Diagnosis not present

## 2024-02-25 DIAGNOSIS — I97711 Intraoperative cardiac arrest during other surgery: Secondary | ICD-10-CM | POA: Diagnosis not present

## 2024-02-25 DIAGNOSIS — N2581 Secondary hyperparathyroidism of renal origin: Secondary | ICD-10-CM | POA: Diagnosis present

## 2024-02-25 DIAGNOSIS — R195 Other fecal abnormalities: Secondary | ICD-10-CM

## 2024-02-25 DIAGNOSIS — M1990 Unspecified osteoarthritis, unspecified site: Secondary | ICD-10-CM | POA: Diagnosis present

## 2024-02-25 DIAGNOSIS — M96A3 Multiple fractures of ribs associated with chest compression and cardiopulmonary resuscitation: Secondary | ICD-10-CM | POA: Diagnosis not present

## 2024-02-25 DIAGNOSIS — Z5309 Procedure and treatment not carried out because of other contraindication: Secondary | ICD-10-CM | POA: Diagnosis present

## 2024-02-25 DIAGNOSIS — N186 End stage renal disease: Secondary | ICD-10-CM | POA: Diagnosis not present

## 2024-02-25 DIAGNOSIS — Z833 Family history of diabetes mellitus: Secondary | ICD-10-CM

## 2024-02-25 DIAGNOSIS — I469 Cardiac arrest, cause unspecified: Secondary | ICD-10-CM | POA: Diagnosis present

## 2024-02-25 DIAGNOSIS — D649 Anemia, unspecified: Principal | ICD-10-CM | POA: Diagnosis present

## 2024-02-25 DIAGNOSIS — E1165 Type 2 diabetes mellitus with hyperglycemia: Secondary | ICD-10-CM | POA: Diagnosis present

## 2024-02-25 DIAGNOSIS — R0902 Hypoxemia: Secondary | ICD-10-CM | POA: Diagnosis not present

## 2024-02-25 DIAGNOSIS — K31819 Angiodysplasia of stomach and duodenum without bleeding: Secondary | ICD-10-CM

## 2024-02-25 DIAGNOSIS — Z79899 Other long term (current) drug therapy: Secondary | ICD-10-CM

## 2024-02-25 DIAGNOSIS — K922 Gastrointestinal hemorrhage, unspecified: Secondary | ICD-10-CM

## 2024-02-25 DIAGNOSIS — Z66 Do not resuscitate: Secondary | ICD-10-CM | POA: Diagnosis present

## 2024-02-25 DIAGNOSIS — I5032 Chronic diastolic (congestive) heart failure: Secondary | ICD-10-CM | POA: Diagnosis present

## 2024-02-25 DIAGNOSIS — J449 Chronic obstructive pulmonary disease, unspecified: Secondary | ICD-10-CM | POA: Diagnosis present

## 2024-02-25 DIAGNOSIS — Z8249 Family history of ischemic heart disease and other diseases of the circulatory system: Secondary | ICD-10-CM

## 2024-02-25 DIAGNOSIS — I132 Hypertensive heart and chronic kidney disease with heart failure and with stage 5 chronic kidney disease, or end stage renal disease: Secondary | ICD-10-CM | POA: Diagnosis present

## 2024-02-25 DIAGNOSIS — D62 Acute posthemorrhagic anemia: Secondary | ICD-10-CM | POA: Diagnosis not present

## 2024-02-25 DIAGNOSIS — R296 Repeated falls: Secondary | ICD-10-CM | POA: Diagnosis present

## 2024-02-25 DIAGNOSIS — Z992 Dependence on renal dialysis: Secondary | ICD-10-CM

## 2024-02-25 DIAGNOSIS — E871 Hypo-osmolality and hyponatremia: Secondary | ICD-10-CM | POA: Diagnosis present

## 2024-02-25 DIAGNOSIS — E1151 Type 2 diabetes mellitus with diabetic peripheral angiopathy without gangrene: Secondary | ICD-10-CM | POA: Diagnosis present

## 2024-02-25 DIAGNOSIS — E1122 Type 2 diabetes mellitus with diabetic chronic kidney disease: Secondary | ICD-10-CM

## 2024-02-25 HISTORY — DX: Angiodysplasia of colon without hemorrhage: K55.20

## 2024-02-25 HISTORY — DX: Anemia, unspecified: D64.9

## 2024-02-25 LAB — HEMOGLOBIN A1C
Hgb A1c MFr Bld: 5.6 % (ref 4.8–5.6)
Mean Plasma Glucose: 114.02 mg/dL

## 2024-02-25 LAB — PREPARE RBC (CROSSMATCH)

## 2024-02-25 LAB — COMPREHENSIVE METABOLIC PANEL WITH GFR
ALT: 26 U/L (ref 0–44)
AST: 32 U/L (ref 15–41)
Albumin: 2.5 g/dL — ABNORMAL LOW (ref 3.5–5.0)
Alkaline Phosphatase: 65 U/L (ref 38–126)
Anion gap: 14 (ref 5–15)
BUN: 46 mg/dL — ABNORMAL HIGH (ref 8–23)
CO2: 26 mmol/L (ref 22–32)
Calcium: 9 mg/dL (ref 8.9–10.3)
Chloride: 94 mmol/L — ABNORMAL LOW (ref 98–111)
Creatinine, Ser: 3.97 mg/dL — ABNORMAL HIGH (ref 0.44–1.00)
GFR, Estimated: 11 mL/min — ABNORMAL LOW (ref >60)
Glucose, Bld: 145 mg/dL — ABNORMAL HIGH (ref 70–99)
Potassium: 4.4 mmol/L (ref 3.5–5.1)
Sodium: 134 mmol/L — ABNORMAL LOW (ref 135–145)
Total Bilirubin: 1.1 mg/dL (ref 0.0–1.2)
Total Protein: 5.1 g/dL — ABNORMAL LOW (ref 6.5–8.1)

## 2024-02-25 LAB — CBC
HCT: 21.4 % — ABNORMAL LOW (ref 36.0–46.0)
Hemoglobin: 6.6 g/dL — CL (ref 12.0–15.0)
MCH: 33.7 pg (ref 26.0–34.0)
MCHC: 30.8 g/dL (ref 30.0–36.0)
MCV: 109.2 fL — ABNORMAL HIGH (ref 80.0–100.0)
Platelets: 167 10*3/uL (ref 150–400)
RBC: 1.96 MIL/uL — ABNORMAL LOW (ref 3.87–5.11)
RDW: 23.9 % — ABNORMAL HIGH (ref 11.5–15.5)
WBC: 4.1 10*3/uL (ref 4.0–10.5)
nRBC: 0.7 % — ABNORMAL HIGH (ref 0.0–0.2)

## 2024-02-25 LAB — IRON AND TIBC
Iron: 149 ug/dL (ref 28–170)
Saturation Ratios: 59 % — ABNORMAL HIGH (ref 10.4–31.8)
TIBC: 253 ug/dL (ref 250–450)
UIBC: 104 ug/dL

## 2024-02-25 LAB — FERRITIN: Ferritin: 1495 ng/mL — ABNORMAL HIGH (ref 11–307)

## 2024-02-25 LAB — VITAMIN B12: Vitamin B-12: 3219 pg/mL — ABNORMAL HIGH (ref 180–914)

## 2024-02-25 LAB — POC OCCULT BLOOD, ED: Fecal Occult Bld: POSITIVE — AB

## 2024-02-25 LAB — GLUCOSE, CAPILLARY
Glucose-Capillary: 88 mg/dL (ref 70–99)
Glucose-Capillary: 130 mg/dL — ABNORMAL HIGH (ref 70–99)

## 2024-02-25 LAB — HEPATITIS B SURFACE ANTIGEN: Hepatitis B Surface Ag: NONREACTIVE

## 2024-02-25 LAB — FOLATE: Folate: 5.6 ng/mL — ABNORMAL LOW (ref >5.9)

## 2024-02-25 MED ORDER — SODIUM CHLORIDE 0.9 % IV SOLN
INTRAVENOUS | Status: DC
Start: 2024-02-25 — End: 2024-02-26

## 2024-02-25 MED ORDER — FERRIC CITRATE 1 GM 210 MG(FE) PO TABS
210.0000 mg | ORAL_TABLET | Freq: Three times a day (TID) | ORAL | Status: DC
  Administered 2024-02-26 – 2024-03-11 (×29): 210 mg via ORAL
  Filled 2024-02-25 (×42): qty 1

## 2024-02-25 MED ORDER — CHLORHEXIDINE GLUCONATE CLOTH 2 % EX PADS
6.0000 | MEDICATED_PAD | Freq: Every day | CUTANEOUS | Status: DC
  Administered 2024-02-26 – 2024-02-29 (×4): 6 via TOPICAL

## 2024-02-25 MED ORDER — FUROSEMIDE 40 MG PO TABS
80.0000 mg | ORAL_TABLET | Freq: Every day | ORAL | Status: DC
  Administered 2024-02-27 – 2024-03-11 (×12): 80 mg via ORAL
  Filled 2024-02-25 (×13): qty 2

## 2024-02-25 MED ORDER — INSULIN ASPART 100 UNIT/ML IJ SOLN
0.0000 [IU] | Freq: Every day | INTRAMUSCULAR | Status: DC
  Administered 2024-02-26 – 2024-02-29 (×3): 2 [IU] via SUBCUTANEOUS
  Administered 2024-03-03 – 2024-03-06 (×3): 3 [IU] via SUBCUTANEOUS
  Administered 2024-03-07: 4 [IU] via SUBCUTANEOUS
  Administered 2024-03-08: 3 [IU] via SUBCUTANEOUS
  Administered 2024-03-09: 2 [IU] via SUBCUTANEOUS

## 2024-02-25 MED ORDER — SODIUM CHLORIDE 0.9% IV SOLUTION: Freq: Once | INTRAVENOUS | Status: AC

## 2024-02-25 MED ORDER — ACETAMINOPHEN 650 MG RE SUPP: 650.0000 mg | Freq: Four times a day (QID) | RECTAL | Status: DC | PRN

## 2024-02-25 MED ORDER — INSULIN GLARGINE-YFGN 100 UNIT/ML ~~LOC~~ SOLN
12.0000 [IU] | Freq: Every day | SUBCUTANEOUS | Status: DC
  Administered 2024-02-26 – 2024-02-28 (×3): 12 [IU] via SUBCUTANEOUS
  Filled 2024-02-25 (×4): qty 0.12

## 2024-02-25 MED ORDER — CARVEDILOL 6.25 MG PO TABS
6.2500 mg | ORAL_TABLET | Freq: Two times a day (BID) | ORAL | Status: DC
  Filled 2024-02-25 (×2): qty 1

## 2024-02-25 MED ORDER — INSULIN ASPART 100 UNIT/ML IJ SOLN
0.0000 [IU] | Freq: Three times a day (TID) | INTRAMUSCULAR | Status: DC
  Administered 2024-02-26 – 2024-02-27 (×2): 1 [IU] via SUBCUTANEOUS
  Administered 2024-02-27 – 2024-03-01 (×3): 3 [IU] via SUBCUTANEOUS
  Administered 2024-03-01: 2 [IU] via SUBCUTANEOUS
  Administered 2024-03-01: 1 [IU] via SUBCUTANEOUS
  Administered 2024-03-02 – 2024-03-03 (×3): 2 [IU] via SUBCUTANEOUS
  Administered 2024-03-04: 1 [IU] via SUBCUTANEOUS
  Administered 2024-03-04 – 2024-03-05 (×2): 2 [IU] via SUBCUTANEOUS
  Administered 2024-03-05: 1 [IU] via SUBCUTANEOUS
  Administered 2024-03-05 – 2024-03-06 (×2): 2 [IU] via SUBCUTANEOUS
  Administered 2024-03-06: 1 [IU] via SUBCUTANEOUS
  Administered 2024-03-07: 2 [IU] via SUBCUTANEOUS
  Administered 2024-03-08: 3 [IU] via SUBCUTANEOUS
  Administered 2024-03-08: 2 [IU] via SUBCUTANEOUS
  Administered 2024-03-09: 5 [IU] via SUBCUTANEOUS
  Administered 2024-03-10: 3 [IU] via SUBCUTANEOUS
  Administered 2024-03-10 – 2024-03-11 (×2): 1 [IU] via SUBCUTANEOUS

## 2024-02-25 MED ORDER — PANTOPRAZOLE SODIUM 40 MG IV SOLR
40.0000 mg | Freq: Two times a day (BID) | INTRAVENOUS | Status: DC
  Administered 2024-02-25 – 2024-03-01 (×12): 40 mg via INTRAVENOUS
  Filled 2024-02-25 (×12): qty 10

## 2024-02-25 MED ORDER — ACETAMINOPHEN 325 MG PO TABS
650.0000 mg | ORAL_TABLET | Freq: Four times a day (QID) | ORAL | Status: DC | PRN
  Administered 2024-03-03: 650 mg via ORAL

## 2024-02-25 NOTE — H&P (Cosign Needed)
 Date: 02/25/2024               Patient Name:  Anna Carr MRN: [id number]  DOB: Dec 18, 1940 Age / Sex: 83 y.o., female   PCP: Abbe Hoard., MD         Medical Service: Internal Medicine Teaching Service         Attending Physician: Dr. Priscella Brooms, DO      First Contact: Dr. Jose Ngo, MD     Second Contact: Dr. Lorelle Roll, DO         After Hours (After 5p/  First Contact Pager: [phone]  weekends / holidays): Second Contact Pager: [phone]   SUBJECTIVE   Chief Complaint: weakness  History of Present Illness: KAYLLA ODENTHAL is an 83 yo female with ESRD on HD TThS, T2DM, nonrheumatic mitral valve regurgitation, HFpEF, advanced PVD, anemia of chronic disease, and hx of duodenal/jejunal angioectasias in 2022 who presents with a few days of worsening weakness.  The patient noticed that around Good Friday she had buckling of her knees and had a fall that day. She went to HD that Tuesday, but they told her since she had a fall with head trauma, she could not get dialysis until she had a clear head CT, thus she went to [facility]. She was admitted to [facility] from 4/22-4/26 (with hyponatremia) but trauma workup was negative. Her hospital stay was reportedly complicated by a 1 night stay in the ICU due to hyperglycemia.  The patient and her daughter state that she never really got back to her baseline physical status after that discharge. She tripped on Monday but overall, was able to get around okay. She then saw her cardiologist on Wednesday, 4/30, who wanted lab work as they were concerned she was anemic given her worsening weakness. The patient states that since then, she has had continued "knee buckling" and weakness, to the point that she has had to use a wheelchair the past couple days when she is normally able to ambulate with the assistance of a cane. She is independent of her ADLs at baseline, but now has had difficulty with showering and getting to  the restroom on her own.    The patient does endorse some dizziness, but otherwise denies any recent illnesses, fevers, chills, chest pain, SOB, abd pain, n/v/d, or hematochezia. She does note that she took lyrica for the first time on 4/29 and it made her dizzy, so she stopped taking this med. The only other new medication change is that the patient has recently started taking aspirin  81 mg daily. She does state that she has had dark stools, but is not sure how long this has been going on, as her ferric citrate  has been known to cause dark stools.  Additionally, the patient makes very little urine and does endorse some dysuria, but denies any hematuria.   ED Course: HR 64, BP 102/41, 100% on room air CBC with Hgb of 6.6 (down from baseline of 7-8)  Meds:  Tylenol   Vitamin C Aspirin  81 mg daily Lipitor 10 mg at bedtime - prescribed but does not take due to constipation  Coreg  6.25 mg bid Ferric citrate  210 mg TID with meals  Flonase  Lasix  80 mg daily HD days in evening; non HD days in AM Soliqua  12-15u daily (took this morning)  Protonix  40 mg daily Vit E Miralax  PRN  Current Meds  Medication Sig   acetaminophen  (TYLENOL ) 500 MG tablet Take 500 mg by mouth  every 6 (six) hours as needed for mild pain (pain score 1-3) or moderate pain (pain score 4-6).   ascorbic acid (VITAMIN C) 1000 MG tablet Take 1,000 mg by mouth daily in the afternoon.   aspirin  EC 81 MG tablet Take 1 tablet (81 mg total) by mouth daily. Swallow whole.   atorvastatin  (LIPITOR) 10 MG tablet Take 1 tablet (10 mg total) by mouth daily. (Patient taking differently: Take 10 mg by mouth at bedtime.)   Biotin 5000 MCG TABS Take 5,000 mcg by mouth daily in the afternoon.   carvedilol  (COREG ) 6.25 MG tablet Take 6.25 mg by mouth 2 (two) times daily.   cyanocobalamin (VITAMIN B12) 1000 MCG tablet Take 1,000 mcg by mouth every evening.   ferric citrate  (AURYXIA ) 1 GM 210 MG(Fe) tablet Take 210 mg by mouth 3 (three) times daily  with meals.   fluticasone  (FLONASE ) 50 MCG/ACT nasal spray Place 1 spray into both nostrils daily as needed (Congestion).   furosemide  (LASIX ) 80 MG tablet Take 80 mg by mouth daily.   Insulin  Glargine-Lixisenatide  (SOLIQUA ) 100-33 UNT-MCG/ML SOPN Inject 12 Units into the skin in the morning. (Patient taking differently: Inject 12 Units into the skin daily at 12 noon.)   Lactobacillus (PROBIOTIC ACIDOPHILUS PO) Take 1 capsule by mouth in the morning.   lidocaine -prilocaine  (EMLA ) cream Apply 1 application topically as needed (prior to fisula access). Tuesday, Thursday, Saturday for dialysis   Omega-3 Fatty Acids (FISH OIL PO) Take 2,400 mg by mouth in the morning and at bedtime.   pantoprazole  (PROTONIX ) 40 MG tablet Take 40 mg by mouth daily.   polyethylene glycol (MIRALAX  / GLYCOLAX ) 17 g packet Take 17 g by mouth daily as needed for moderate constipation.   Vitamin E 670 MG (1000 UT) CAPS Take 1,000 Units by mouth in the morning.    Past Medical History  Past Surgical History:  Procedure Laterality Date   A/V FISTULAGRAM Left 02/17/2021   Procedure: A/V FISTULAGRAM;  Surgeon: Adine Hoof, MD;  Location: [facility] INVASIVE CV LAB;  Service: Cardiovascular;  Laterality: Left;   APPENDECTOMY     AV FISTULA PLACEMENT Left    AV FISTULA PLACEMENT Left 03/12/2021   Procedure: INSERTION OF LEFT UPPER ARM ARTERIOVENOUS (AV) GORE-TEX GRAFT;  Surgeon: Adine Hoof, MD;  Location: [facility] OR;  Service: Vascular;  Laterality: Left;   COLONOSCOPY WITH PROPOFOL  N/A 03/18/2021   Procedure: COLONOSCOPY WITH PROPOFOL ;  Surgeon: Albertina Hugger, MD;  Location: [facility] ENDOSCOPY;  Service: Gastroenterology;  Laterality: N/A;   CYSTECTOMY     ENTEROSCOPY N/A 03/21/2021   Procedure: ENTEROSCOPY;  Surgeon: Albertina Hugger, MD;  Location: [facility] ENDOSCOPY;  Service: Gastroenterology;  Laterality: N/A;   ENTEROSCOPY N/A 12/20/2021   Procedure: ENTEROSCOPY;  Surgeon: Albertina Hugger, MD;   Location: [facility] ENDOSCOPY;  Service: Gastroenterology;  Laterality: N/A;   ESOPHAGOGASTRODUODENOSCOPY (EGD) WITH PROPOFOL  N/A 03/18/2021   Procedure: ESOPHAGOGASTRODUODENOSCOPY (EGD) WITH PROPOFOL ;  Surgeon: Albertina Hugger, MD;  Location: [facility] ENDOSCOPY;  Service: Gastroenterology;  Laterality: N/A;   GIVENS CAPSULE STUDY N/A 03/19/2021   Procedure: GIVENS CAPSULE STUDY;  Surgeon: Albertina Hugger, MD;  Location: [facility] ENDOSCOPY;  Service: Gastroenterology;  Laterality: N/A;   HEMOSTASIS CLIP PLACEMENT  12/20/2021   Procedure: HEMOSTASIS CLIP PLACEMENT;  Surgeon: Albertina Hugger, MD;  Location: [facility] ENDOSCOPY;  Service: Gastroenterology;;   HOT HEMOSTASIS N/A 03/21/2021   Procedure: HOT HEMOSTASIS (ARGON PLASMA COAGULATION/BICAP);  Surgeon: Albertina Hugger,  MD;  Location: [facility] ENDOSCOPY;  Service: Gastroenterology;  Laterality: N/A;   HOT HEMOSTASIS N/A 12/20/2021   Procedure: HOT HEMOSTASIS (ARGON PLASMA COAGULATION/BICAP);  Surgeon: Albertina Hugger, MD;  Location: [facility] ENDOSCOPY;  Service: Gastroenterology;  Laterality: N/A;   HYSTEROSCOPY     LOWER EXTREMITY ANGIOGRAPHY N/A 02/03/2024   Procedure: Lower Extremity Angiography;  Surgeon: Avanell Leigh, MD;  Location: [facility] INVASIVE CV LAB;  Service: Cardiovascular;  Laterality: N/A;   POLYPECTOMY  03/18/2021   Procedure: POLYPECTOMY;  Surgeon: Albertina Hugger, MD;  Location: [facility] ENDOSCOPY;  Service: Gastroenterology;;    Social:  Lives With: son Support: family; her daughter is present in the room now and lives next door to the patient Level of Function: Normally independent of ADLs and manages her own medications, but has required assistance with ADLs now PCP: Abbe Hoard., MD Substances: 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 No alcohol  use  Family History: Reviewed, no pertinent updates  Allergies: Allergies as of 02/25/2024   (No Known Allergies)    Review of Systems: A complete ROS  was negative except as per HPI.   OBJECTIVE:   Physical Exam: Blood pressure 116/60, pulse 65, temperature 97.7 F (36.5 C), temperature source Oral, resp. rate 18, SpO2 100%.   Constitutional: appears well, no acute distress Cardiovascular: regular rate and rhythm, systolic murmur appreciated Pulmonary/Chest: normal work of breathing on room air, lungs clear to auscultation bilaterally Abdominal: soft, non-tender, non-distended, no cva tenderness, no suprapubic tenderness Rectal: black stool noted on digital rectal exam with external hemorrhoids MSK: 2+ pitting edema bilateral lower extremities R>L Neurological: alert & oriented x 3, no focal deficit noted Skin: warm and dry Psych: normal mood and affect  Labs: CBC    Component Value Date/Time   WBC 4.1 02/25/2024 1008   RBC 1.96 (L) 02/25/2024 1008   HGB 6.6 (LL) 02/25/2024 1008   HGB 11.3 01/12/2024 1259   HCT 21.4 (L) 02/25/2024 1008   HCT 35.0 01/12/2024 1259   PLT 167 02/25/2024 1008   PLT 208 01/12/2024 1259   MCV 109.2 (H) 02/25/2024 1008   MCV 99 (H) 01/12/2024 1259   MCH 33.7 02/25/2024 1008   MCHC 30.8 02/25/2024 1008   RDW 23.9 (H) 02/25/2024 1008   RDW 15.7 (H) 01/12/2024 1259   LYMPHSABS 0.9 01/12/2024 1259   MONOABS 0.7 12/19/2021 1701   EOSABS 0.1 01/12/2024 1259   BASOSABS 0.0 01/12/2024 1259     CMP     Component Value Date/Time   NA 134 (L) 02/25/2024 1008   NA 134 01/12/2024 1259   K 4.4 02/25/2024 1008   CL 94 (L) 02/25/2024 1008   CO2 26 02/25/2024 1008   GLUCOSE 145 (H) 02/25/2024 1008   BUN 46 (H) 02/25/2024 1008   BUN 31 (H) 01/12/2024 1259   CREATININE 3.97 (H) 02/25/2024 1008   CALCIUM  9.0 02/25/2024 1008   PROT 5.1 (L) 02/25/2024 1008   ALBUMIN 2.5 (L) 02/25/2024 1008   AST 32 02/25/2024 1008   ALT 26 02/25/2024 1008   ALKPHOS 65 02/25/2024 1008   BILITOT 1.1 02/25/2024 1008   GFRNONAA 11 (L) 02/25/2024 1008    Imaging:  EKG: Sinus bradycardia  ASSESSMENT & PLAN:    Assessment & Plan by Problem: Principal Problem:   Acute on chronic anemia  Acute on Chronic Anemia  Diverticulosis (2022)  Has external hemorrhoids and grossly dark stools that pt  is unable to state how long these have been going on for. DRE positive for grossly black stool with FOBT positive for blood. Benign abdominal exam. Seems like last colonoscopy was in 2022 which revealed diverticulosis. Hgb is 6.6, Baseline around 7-8.  Has ESRD likely contributing to her chronic anemia as well , ferritin on 12/21/2021 was 1,2223. Currently receiving 2 units of pRBCs. She is symptomatic with weakness, dyspnea on ambulation, and orthostatic symptoms of dizziness with position changes. WBC is 4.1, remains afebrile. Will continue to monitor, start her on clear liquid diet, and consult GI. With dark stool she may also have an upper GI bleed and may need endoscopy as well.   -Transfuse until Hgb >7  -Monitor H/H  -Consult GI -PT/OT  -Orthostatics in the morning  -iron  studies once hemodynamically stable  -Folate, B12 levels  -Will benefit from high fiber diet OP  -Pantoprazole  40mg  daily, will switch to BID.   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 Weakness likely secondary to recent hospitalization and also anemia. Will benefit from PT/OT. Had CT at [place] and is currently neurologically intact. Took one dose of lyrica and stopped, re-started aspirin  recently, otherwise denies having any new meds. She does not have suprapubic tenderness, UTI less likely.    HFpEF  Last echo was done on 12/2023 showing 60-65 EF, with normal function of the LV. Has moderate to severe MR. No aortic stenosis.Has R>L BLE edema, JVD, but clear lungs. LFTs WNL, extremities are warm, but pulses low. She does have PAD and likely due to this. Cr is 3.97, pt is ESRD and still making urine. States she is on furosemide  to produce urine. BP is  119/53. She is Coreg  6.25mg  BID at home.   -Cw  furosemide , and coreg  -CXR  -Standing weights  -I/Os   T2DM With 12 untis Soliqua  each AM. Glucose currently is 145, she had her last dose this AM.  -AM glucose monitoring  -will cw 12 units tomorrow morning -very sensitive SSI with night coverage  ESRD (T-TH-Sat)  Follows with [facility]. K is 4.4, corrected Ca is 10.3. Will need dialysis tomorrow.   HLD  Tried lipitor 10mg  daily but she has not taken this as it made her constipated. She is on fish oil now. Will need lipid panel OP. She is also diabetic and would benefit from statin.  Diet:  Clear liquid VTE: SCDs IVF: 2 units pRBCS Code: Full  Prior to Admission Living Arrangement: Home, living with son Anticipated Discharge Location: SNF Barriers to Discharge: medical work-up  Dispo: Admit patient to Observation with expected length of stay less than 2 midnights.  Signed: [facility] Internal Medicine Resident PGY-1 02/25/2024, 1:38 PM

## 2024-02-25 NOTE — Consult Note (Signed)
 Renal Service Consult Note [facility]  Anna Carr 02/25/2024 Lynae Sandifer, MD Requesting Physician: Dr. Adriane Albe  Reason for Consult: ESRD pt w/ gen'd weakness and worsening anemia  HPI: The patient is a 83 y.o. year-old w/ PMH as below who presented to ED 5/02 c/o gen'd weakness, difficulty w/ ambulating. Had a fall 2 wks ago and was admitted elsewhere to ICU due to high  BS's. In ED VSS, Hb was 6.6. Pt was admitted for acute/ chronic anemia. We are asked to see for dialysis.    Pt seen in room. No c/o today. History as above.    ROS - denies CP, no joint pain, no HA, no blurry vision, no rash, no diarrhea, no nausea/ vomiting  PMH: H/o Cdif colitis Anemia of chronic disease COPD  ESRD on HD HTN HL Major depressive d/o OA DM2 on insulin   Past Surgical History  Past Surgical History:  Procedure Laterality Date   A/V FISTULAGRAM Left 02/17/2021   Procedure: A/V FISTULAGRAM;  Surgeon: Adine Hoof, MD;  Location: [facility] INVASIVE CV LAB;  Service: Cardiovascular;  Laterality: Left;   APPENDECTOMY     AV FISTULA PLACEMENT Left    AV FISTULA PLACEMENT Left 03/12/2021   Procedure: INSERTION OF LEFT UPPER ARM ARTERIOVENOUS (AV) GORE-TEX GRAFT;  Surgeon: Adine Hoof, MD;  Location: [facility] OR;  Service: Vascular;  Laterality: Left;   COLONOSCOPY WITH PROPOFOL  N/A 03/18/2021   Procedure: COLONOSCOPY WITH PROPOFOL ;  Surgeon: Albertina Hugger, MD;  Location: [facility] ENDOSCOPY;  Service: Gastroenterology;  Laterality: N/A;   CYSTECTOMY     ENTEROSCOPY N/A 03/21/2021   Procedure: ENTEROSCOPY;  Surgeon: Albertina Hugger, MD;  Location: [facility] ENDOSCOPY;  Service: Gastroenterology;  Laterality: N/A;   ENTEROSCOPY N/A 12/20/2021   Procedure: ENTEROSCOPY;  Surgeon: Albertina Hugger, MD;  Location: [facility] ENDOSCOPY;  Service: Gastroenterology;  Laterality: N/A;   ESOPHAGOGASTRODUODENOSCOPY (EGD) WITH PROPOFOL  N/A 03/18/2021   Procedure:  ESOPHAGOGASTRODUODENOSCOPY (EGD) WITH PROPOFOL ;  Surgeon: Albertina Hugger, MD;  Location: [facility] ENDOSCOPY;  Service: Gastroenterology;  Laterality: N/A;   GIVENS CAPSULE STUDY N/A 03/19/2021   Procedure: GIVENS CAPSULE STUDY;  Surgeon: Albertina Hugger, MD;  Location: [facility] ENDOSCOPY;  Service: Gastroenterology;  Laterality: N/A;   HEMOSTASIS CLIP PLACEMENT  12/20/2021   Procedure: HEMOSTASIS CLIP PLACEMENT;  Surgeon: Albertina Hugger, MD;  Location: [facility] ENDOSCOPY;  Service: Gastroenterology;;   HOT HEMOSTASIS N/A 03/21/2021   Procedure: HOT HEMOSTASIS (ARGON PLASMA COAGULATION/BICAP);  Surgeon: Albertina Hugger, MD;  Location: [facility] ENDOSCOPY;  Service: Gastroenterology;  Laterality: N/A;   HOT HEMOSTASIS N/A 12/20/2021   Procedure: HOT HEMOSTASIS (ARGON PLASMA COAGULATION/BICAP);  Surgeon: Albertina Hugger, MD;  Location: [facility] ENDOSCOPY;  Service: Gastroenterology;  Laterality: N/A;   HYSTEROSCOPY     LOWER EXTREMITY ANGIOGRAPHY N/A 02/03/2024   Procedure: Lower Extremity Angiography;  Surgeon: Avanell Leigh, MD;  Location: [facility] INVASIVE CV LAB;  Service: Cardiovascular;  Laterality: N/A;   POLYPECTOMY  03/18/2021   Procedure: POLYPECTOMY;  Surgeon: Albertina Hugger, MD;  Location: [facility] ENDOSCOPY;  Service: Gastroenterology;;   Family History  Family History  Problem Relation Age of Onset   Diabetes Mother    Hypertension Mother    Diabetes Maternal Grandfather    Liver disease Other    Social History  reports that she has quit smoking. Her smoking use included cigarettes. She has never used smokeless tobacco. She reports that she does not  currently use alcohol . She reports that she does not use drugs. Allergies No Known Allergies Home medications Prior to Admission medications   Medication Sig Start Date End Date Taking? Authorizing Provider  acetaminophen  (TYLENOL ) 500 MG tablet Take 500 mg by mouth every 6 (six) hours as needed for mild pain (pain score 1-3) or moderate pain (pain  score 4-6).   Yes [provider]  ascorbic acid (VITAMIN C) 1000 MG tablet Take 1,000 mg by mouth daily in the afternoon. 12/07/19  Yes [provider]  aspirin  EC 81 MG tablet Take 1 tablet (81 mg total) by mouth daily. Swallow whole. 12/01/23  Yes Krasowski, Agnieszka Newhouse J, MD  atorvastatin  (LIPITOR) 10 MG tablet Take 1 tablet (10 mg total) by mouth daily. Patient taking differently: Take 10 mg by mouth at bedtime. 12/01/23  Yes Krasowski, Addylin Manke J, MD  Biotin 5000 MCG TABS Take 5,000 mcg by mouth daily in the afternoon.   Yes [provider]  carvedilol  (COREG ) 6.25 MG tablet Take 6.25 mg by mouth 2 (two) times daily. 12/07/19  Yes [provider]  cyanocobalamin (VITAMIN B12) 1000 MCG tablet Take 1,000 mcg by mouth every evening.   Yes [provider]  ferric citrate  (AURYXIA ) 1 GM 210 MG(Fe) tablet Take 210 mg by mouth 3 (three) times daily with meals.   Yes [provider]  fluticasone  (FLONASE ) 50 MCG/ACT nasal spray Place 1 spray into both nostrils daily as needed (Congestion).   Yes [provider]  furosemide  (LASIX ) 80 MG tablet Take 80 mg by mouth daily. 04/03/21  Yes [provider]  Insulin  Glargine-Lixisenatide  (SOLIQUA ) 100-33 UNT-MCG/ML SOPN Inject 12 Units into the skin in the morning. Patient taking differently: Inject 12 Units into the skin daily at 12 noon. 03/23/21  Yes Samtani, Jai-Gurmukh, MD  Lactobacillus (PROBIOTIC ACIDOPHILUS PO) Take 1 capsule by mouth in the morning.   Yes [provider]  lidocaine -prilocaine  (EMLA ) cream Apply 1 application topically as needed (prior to fisula access). Tuesday, Thursday, Saturday for dialysis 07/04/20  Yes [provider]  Omega-3 Fatty Acids (FISH OIL PO) Take 2,400 mg by mouth in the morning and at bedtime.   Yes [provider]  pantoprazole  (PROTONIX ) 40 MG tablet Take 40 mg by mouth daily.   Yes [provider]  polyethylene glycol  (MIRALAX  / GLYCOLAX ) 17 g packet Take 17 g by mouth daily as needed for moderate constipation.   Yes [provider]  Vitamin E 670 MG (1000 UT) CAPS Take 1,000 Units by mouth in the morning. 01/09/20  Yes [provider]  carboxymethylcellulose (REFRESH PLUS) 0.5 % SOLN Place 1 drop into both eyes 3 (three) times daily as needed (dry eyes).    [provider]  pregabalin (LYRICA) 75 MG capsule Take 75 mg by mouth daily. Patient not taking: Reported on 02/25/2024    [provider]     Vitals:   02/25/24 1523 02/25/24 1545 02/25/24 1602 02/25/24 1617  BP: (!) 111/38 (!) 110/40 (!) 100/44 (!) 108/44  Pulse: 66 69 64 66  Resp: 18 18 18 18   Temp: 97.8 F (36.6 C) 97.9 F (36.6 C) 97.9 F (36.6 C) 98.1 F (36.7 C)  TempSrc: Oral Oral Oral Oral  SpO2: 98% 98% 100% 97%   Exam Gen alert, no distress, pleasant, on RA, HOH No rash, cyanosis or gangrene Sclera anicteric, throat clear  No jvd or bruits Chest clear bilat to bases, no rales/ wheezing RRR no MRG Abd  soft ntnd no mass or ascites +bs GU defer MS no joint effusions or deformity Ext 1+ bilat pitting pretib LE edema, no other edema Neuro is alert, Ox 3 , nf    LUA AVG +bruit       Renal-related home meds: Coreg  6.25 bid Auryxia  1 ac tid Lasix  80 daily    OP HD: [place] TTS 3.5h   B400    70.6kg    LUA AVG   Heparin  none Last OP HD 5/01, post wt 70.9 Comes off 0-2kg over, bp's wnl on HD   Assessment/ Plan: Acute on chronic anemia/ generalized weakness: FOBT+, Hb 6.6 on admission. GI to be consulted. Per pmd. 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 HD tomorrow.  HTN: bp's are on the low side of normal Volume: does have bilat LE edema 1-2+. UF goal 3 L w/ HD tomorrow Anemia of esrd: low Hb, getting prbc's most likely Secondary hyperparathyroidism: CCa in range, cont binders ac tid.  IDDM: per pmd      Larry Poag  MD [facility] 02/25/2024, 5:49 PM  Recent Labs  Lab 02/25/24 1008  HGB 6.6*  ALBUMIN  2.5*  CALCIUM  9.0  CREATININE 3.97*  K 4.4   Inpatient medications:  carvedilol   6.25 mg Oral BID   ferric citrate   210 mg Oral TID WC   [START ON 02/26/2024] furosemide   80 mg Oral Daily   insulin  aspart  0-5 Units Subcutaneous QHS   insulin  aspart  0-6 Units Subcutaneous TID WC   [START ON 02/26/2024] insulin  glargine-yfgn  12 Units Subcutaneous Daily   pantoprazole  (PROTONIX ) IV  40 mg Intravenous Q12H    acetaminophen  **OR** acetaminophen 

## 2024-02-25 NOTE — Hospital Course (Addendum)
 Patient Summary:Danny ADAMAE RICKLEFS is a 83 y.o. with a pertinent PMH of ESRD on HD TThS, T2DM, nonrheumatic mitral valve regurgitation, HFpEF, advanced PVD, anemia of chronic disease, and hx of duodenal/jejunal angioectasias in 2022 who presented with weakness and melenic stool and admitted for concern for UGI bleed.   Acute on Chronic Anemia from Acute Upper GI bleed (stable) Hx of duodenal/jejunal angioectasias Hx Diverticulosis (2022)  This was her presenting issue. Pt with a history of AVMs. Started aspirin  for CAD recently. Admission 5/2 with Hg of 6.6 and received blood, but thereafter stable. EGD and capsule endoscopy without clear source of bleed. 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 During that procedure, pt experience bradycardic arrest and quick ROSC after brief CPR. Thereafter bleeding remained stable with no reports of blood per rectum and stable Hbg at discharge of 9.1. Received IV iron  infusion and to continue oral supplement. Held ASA given hx of recurrent GI bleeds, will f/u with her PCP.    Intraprocedure Cardiac Arrest during EGD with CPR Chest pain MSK from CPR Likely vagal with intra-procedural bradycardia and arrest. Short duration of CPR until ROSC. Spent two nights in ICU, was extubated 5/8 and transferred 5/10. Pt no longer candidate for endoscopy. MSK pain improved with lidocaine  patch, tylenol , oxycodone  PRN. At risk for aspiration, monitored respiratory status, which remains stable, normal WBC and afebrile. Was declined for inpatient rehab, patient preferred to return home, resumption of [facility] services (PT/OT/RN/SW and aide).  L shoulder pain, musculoskeletal Improving. Etiology from moving herself in bed, possible influence from her recent CPR. L shoulder XR unrevealing. Supportive care with voltaren  gel, tylenol  and aquathermia. PT/OT following.   Chronic Stable Issues Chronic Hyponatremia As low as 120 early in course  but improved with return to diet, potentially due to recently being NPO and ESRD. Baseline 127-130 range. No symptoms. - Ensure feeding supplement   Hyperkalemia, resolved ESRD on HD TThS Resume dialysis schedule. Stable. Briefly had hyperkalemia, EKG stable, received temporizing and Lokelma , normalized to 4.8. Received inpatient HD last on 5/17.    Insulin  dependent T2DM, controlled  Patient was hypoglycemic earlier in hospitalization and so maintenance insulin  stopped. Resumed at lower dose of 6 units of long acting. Will d/c with reduced dose of 6 units daily of her Soliqua    HFpEF Stable, not in exacerbation. Last echo 12/2023 with 60-65 EF and normal function of the LV. Has moderate to severe MR. No aortic stenosis. Continue lasix  daily. Restart home Coreg  at PCP f/u. Receives HD for volume management.

## 2024-02-25 NOTE — Evaluation (Signed)
 Physical Therapy Evaluation Patient Details Name: KRYSTALANN BALLOG MRN: [id number] DOB: June 04, 1941 Today's Date: 02/25/2024  History of Present Illness  83 yo female who presents 5/2 with a few days of worsening weakness. Acute on Chronic Anemia. Multiple falls. PMH: ESRD on HD TThS, T2DM, nonrheumatic mitral valve regurgitation, HFpEF, advanced PVD, anemia of chronic disease, and hx of duodenal/jejunal angioectasias in 2022.  Clinical Impression  Pt admitted with above diagnosis. Had HHPT arranged PTA but was hospitalized prior to first visit. Pt typically independent at baseline, uses SPC outside. No falls prior to decline a couple of weeks ago, now having several falls immediately prior to this admission. Lives with son. Has 24/7 assist available. She ambulates with RW at [facility] level assist today. Feels better after blood transfusions (currently getting second bag now.) Some dizziness - unable to assess BP but symptoms mild (transfusion in Rt UE, restricted LUE.)  She fatigued quickly and is a bit anxious with gait. Feel HHPT follow-up after d/c is appropriate. She has a RW to use at home and encouraged to use for safety due to falls. Pt agreeable. Will follow acutely and progress as tolerated. Anticipate quick functional recovery once medically improved. Pt currently with functional limitations due to the deficits listed below (see PT Problem List). Pt will benefit from acute skilled PT to increase their independence and safety with mobility to allow discharge.           If plan is discharge home, recommend the following: A little help with walking and/or transfers;A little help with bathing/dressing/bathroom;Assistance with cooking/housework;Assist for transportation;Help with stairs or ramp for entrance   Can travel by private vehicle        Equipment Recommendations None recommended by PT  Recommendations for Other Services       Functional Status Assessment Patient has had a recent  decline in their functional status and demonstrates the ability to make significant improvements in function in a reasonable and predictable amount of time.     Precautions / Restrictions Precautions Precautions: Fall Recall of Precautions/Restrictions: Intact Restrictions Weight Bearing Restrictions Per Provider Order: No      Mobility  Bed Mobility Overal bed mobility: Modified Independent             General bed mobility comments: no assist, extra time    Transfers Overall transfer level: Needs assistance Equipment used: Rolling walker (2 wheels) Transfers: Sit to/from Stand Sit to Stand: Supervision           General transfer comment: Supervision for safety, cautious rising but stable with RW for support. Able to perform without RW as well.    Ambulation/Gait Ambulation/Gait assistance: Contact guard assist Gait Distance (Feet): 30 Feet Assistive device: Rolling walker (2 wheels) Gait Pattern/deviations: Step-through pattern, Decreased stride length, Drifts right/left, Staggering left Gait velocity: dec Gait velocity interpretation: <1.8 ft/sec, indicate of risk for recurrent falls   General Gait Details: Minor instability noted, CGA for safety. No buckling however pt nervous and feels LEs may buckle. Cues for RW placement and UE support to provide needed leverage in case LEs buckle. 2 episodes of staggering with turns but able to self correct. Educated on awareness and safety. Fatigued easily.  Stairs            Wheelchair Mobility     Tilt Bed    Modified Rankin (Stroke Patients Only)       Balance Overall balance assessment: Mild deficits observed, not formally tested  Pertinent Vitals/Pain Pain Assessment Pain Assessment: No/denies pain    Home Living Family/patient expects to be discharged to:: Private residence Living Arrangements: Children Available Help at Discharge:  Family;Available 24 hours/day Type of Home: House Home Access: Ramped entrance       Home Layout: One level Home Equipment: Rolling Walker (2 wheels);Grab bars - tub/shower;Wheelchair Financial trader (4 wheels);BSC/3in1;Cane - single point      Prior Function Prior Level of Function : Needs assist;History of Falls (last six months)             Mobility Comments: ambulates unassisted at home, takes cane outside. Denies falls until this past week. ADLs Comments: able to bath/dress herself; does a little cooking. Does not clean.     Extremity/Trunk Assessment   Upper Extremity Assessment Upper Extremity Assessment: Defer to OT evaluation    Lower Extremity Assessment Lower Extremity Assessment: Generalized weakness (pitting edema)       Communication   Communication Communication: No apparent difficulties    Cognition Arousal: Alert Behavior During Therapy: WFL for tasks assessed/performed   PT - Cognitive impairments: No apparent impairments                         Following commands: Intact       Cueing Cueing Techniques: Verbal cues     General Comments General comments (skin integrity, edema, etc.): Getting blood transfusion RUE, graft RUE - unable to check BP. but had mild dizziness throughout session.    Exercises General Exercises - Lower Extremity Ankle Circles/Pumps: AROM, Both, 10 reps, Supine Quad Sets: Strengthening, Both, 10 reps, Supine Gluteal Sets: Strengthening, Both, 10 reps, Supine   Assessment/Plan    PT Assessment Patient needs continued PT services  PT Problem List Decreased strength;Decreased activity tolerance;Decreased balance;Decreased mobility;Decreased knowledge of use of DME;Cardiopulmonary status limiting activity;Obesity       PT Treatment Interventions DME instruction;Gait training;Functional mobility training;Therapeutic activities;Therapeutic exercise;Neuromuscular re-education;Balance training;Patient/family  education    PT Goals (Current goals can be found in the Care Plan section)  Acute Rehab PT Goals Patient Stated Goal: Get well go home PT Goal Formulation: With patient/family Time For Goal Achievement: 03/10/24 Potential to Achieve Goals: Good    Frequency Min 2X/week     Co-evaluation               AM-PAC PT "6 Clicks" Mobility  Outcome Measure Help needed turning from your back to your side while in a flat bed without using bedrails?: None Help needed moving from lying on your back to sitting on the side of a flat bed without using bedrails?: None Help needed moving to and from a bed to a chair (including a wheelchair)?: A Little Help needed standing up from a chair using your arms (e.g., wheelchair or bedside chair)?: A Little Help needed to walk in hospital room?: A Little Help needed climbing 3-5 steps with a railing? : A Little 6 Click Score: 20    End of Session Equipment Utilized During Treatment: Gait belt Activity Tolerance: Patient limited by fatigue Patient left: in bed;with call bell/phone within reach;with bed alarm set;with family/visitor present Nurse Communication: Mobility status PT Visit Diagnosis: Unsteadiness on feet (R26.81);Other abnormalities of gait and mobility (R26.89);Repeated falls (R29.6);Muscle weakness (generalized) (M62.81);History of falling (Z91.81);Difficulty in walking, not elsewhere classified (R26.2);Dizziness and giddiness (R42)    Time: 4098-1191 PT Time Calculation (min) (ACUTE ONLY): 30 min   Charges:   PT Evaluation $PT Eval Low Complexity:  1 Low PT Treatments $Therapeutic Activity: 8-22 mins PT General Charges $$ ACUTE PT VISIT: 1 Visit         Jory Ng, PT, DPT [facility]  Rehabilitation Services Physical Therapist Office: [phone] Website: [email or web address]   Alinda Irani 02/25/2024, 6:16 PM

## 2024-02-25 NOTE — H&P (View-Only) (Signed)
 Consultation Note   Referring Provider:  Internal Medicine Teaching Service PCP: Abbe Hoard., MD Primary Gastroenterologist::   Previously Anna Rhea, MD          Reason for Consultation:  Anemia, FOBT+  DOA: 02/25/2024         Hospital Day: 1   ASSESSMENT    83 y.o. 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 Diff colitis and colon polyps   Acute on chronic macrocytic anemia Dark stool (? On iron ), FOBT+  History of small bowel angioectasias Suspect worsening anemia secondary to occult GI bleeding related to small bowel angioectasias  History of colon polyps ( TA and ? Sessile serrated) Up-to-date on surveillance colonoscopy  Chronic constipation  ESRD on HD  See PMH for additional medical history    PLAN:   --Check B12, folate and iron  studies -- Clear liquids, n.p.o. after midnight -- Will plan for small bowel endoscopy tomorrow.  The risks and benefits of EGD with possible biopsies were discussed with the patient who agrees to proceed.  -- Continue twice daily IV PPI -- Blood transfusion in progress.   HPI   Anna Carr is known to use for a history gastrointestinal AVMs. She was last evaluated by us  in 2023.    Anna Carr presented to the ED on 02/23/2024 with progressive weakness . In ED her hemoglobin was found to be down several grams from about 1 month ago.  She reports chronic dark stool but states it is related to a binder that she takes which apparently contains iron .  There has been no change in her bowel movements.  She has chronic nausea with intermittent vomiting.  Daughter says that she has lately been complaining more about nausea.  She is not anticoagulated.  She does not take  NSAID except for recent resumption of a daily baby aspirin .  She restarted at home she is on a daily PPI but denies a history of GERD.  She tells me that she takes it to protect her stomach from bleeding  ED workup:  BP slightly soft.  Otherwise hemodynamically stable.  Normal white count.  Hemoglobin 6.6, down from 11.3 in mid March.  Platelets 167.     Previous GI Studies   **Most recent studies  29-Dec-2021 Small bowel endoscopy Pertinent findings  - Normal stomach. - Normal examined duodenum. 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 Treated with argon plasma coagulation (APC). Clips (MR conditional) were placed.  12/17/21 Endo Capsule Pertinent findings:  Test complete with adequate prep Active bleeding in proximal small bowel within 5 min first duodenal image. Blood in lumen intermittently throughout image at 1 hr 27 min but culprit not visualized  May 2022 Small bowel endoscopy  - Normal stomach. - A single recently bleeding angioectasia in the duodenum. Treated with argon plasma coagulation (APC). 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 Treated with argon plasma coagulation (APC). - No specimens collected  Colonoscopy May 2022 for IDA, FOBT+ - Preparation of the colon was fair. - Two diminutive polyps  in the transverse colon and in the cecum, removed with a cold snare. Resected and retrieved. - Diverticulosis in the left colon. - Redundant colon. - The examination was otherwise normal on direct and retroflexion views.  A. COLON, CECAL AND TRANSVERSE, POLYPECTOMY:  - Tubular adenoma without high-grade dysplasia or malignancy  - Small serrated polyp, cannot distinguish between a sessile serrated polyp and hyperplastic polyp    Labs and Imaging:  Recent Labs    02/25/24 1008  WBC 4.1  HGB 6.6*  HCT 21.4*  MCV 109.2*  PLT 167   No results for input(s): "FOLATE", "VITAMINB12", "FERRITIN", "TIBC", "IRONPCTSAT" in the last 72 hours. Recent Labs    02/25/24 1008   NA 134*  K 4.4  CL 94*  CO2 26  GLUCOSE 145*  BUN 46*  CREATININE 3.97*  CALCIUM  9.0   Recent Labs    02/25/24 1008  PROT 5.1*  ALBUMIN 2.5*  AST 32  ALT 26  ALKPHOS 65  BILITOT 1.1   No results for input(s): "INR" in the last 72 hours. No results for input(s): "AFPTUMOR" in the last 72 hours.  PERIPHERAL VASCULAR CATHETERIZATION Narrative: Images from the original result were not included.  [id number] LOCATION:  FACILITY: [facility]  PHYSICIAN: Lauro Portal, M.D. 1941/10/01  DATE OF PROCEDURE:  02/03/2024  DATE OF DISCHARGE:  PV Angiogram/Intervention  History obtained from chart review. Anna Carr is a 83 y.o. mildly  overweight widowed Caucasian female mother of 2 living children (1  deceased), grandmother of 2 grandchildren who is accompanied by one of her  daughters Anna Carr today.  She was referred by Pattricia Bores, NP in [facility]  for evaluation of PAD.  She is a cardiology patient of Dr. Tonja Fray.   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  She has a history of treated  hypertension, diabetes and hyperlipidemia.  She is never had heart attack  or stroke.  She does complain of dyspnea.  She has been on dialysis for  last 4 years.  She does have valvular heart disease with echo performed  12/29/2023 revealing normal EF, moderately elevated PA systolic pressure,  moderate to severe MR, mild to moderate MS, mild to moderate TR with no  evidence of aortic stenosis.  She has had numbness in her legs with  fatigue over the last year however in the last week or 2 she has noticed  severe limitation of ambulation related to right leg discomfort.  She had  Dopplers performed 11/24/2023 revealing a right ABI of 0.29 and a left of  0.53.  She did have a high-frequency signal in her right extrailiac artery  and moderate disease in her left SFA.  Her symptoms are  lifestyle-limiting.  She wishes to proceed with outpatient peripheral  angiography and endovascular therapy.    Pre Procedure Diagnosis: Peripheral arterial disease  Post Procedure Diagnosis: Peripheral arterial disease  Operators: Dr. Lauro Portal  Procedures Performed:  1.  Right common femoral access  2.  Abdominal aortogram/bilateral iliac angiogram/bifemoral runoff  3.  Pullback gradient across the right common and external iliac artery  using a endhole catheter after the administration of 200 mcg of  intra-arterial nitroglycerin   4.  Oblique angiogram of the right common femoral artery  PROCEDURE DESCRIPTION:   The patient was brought to the second floor [facility] lab in  the the postabsorptive state. She was not premedicated . Her right groin  was prepped and shaved in usual sterile fashion. Xylocaine  1% was  used for  local anesthesia. A 5 French sheath was inserted into the right common  femoral artery using standard Seldinger technique.  A 5 [place] Berenstein  2 catheter was used to cross the right common iliac artery ostium.  A 5  French pedal catheter was then placed in the distal abdominal aorta over  an 035 versa core wire.  Abdominal aortography, bilateral iliac  angiography with bifemoral runoff was performed using bolus chase, digital  subtraction and step table technique.  Omnipaque dye was used for the  entirety of the case (120 cc of contrast total of the patient).   Retrograde aortic pressure was monitored in the case.  A 5 French short  straight endhole catheter was used to perform a pullback gradient across  the ostium of the right common iliac artery as well as the right external  iliac artery after administration of 200 mcg of intra-arterial  nitroglycerin  through a set up sheath.  There was no gradient identified.  Angiographic Data:   1: Abdominal aorta-fluoroscopically calcified, small in caliber but no  significant atherosclerotic changes 2: Left lower extremity-50% left common femoral artery stenosis, diffuse  60 to 70% segmental proximal  and mid left SFA stenosis.  Tandem 95%  stenoses in the distal left SFA and proximal P1 segment.  Two-vessel  runoff via the anterior tibial and peroneal.  Peroneal had a 75% segmental  stenosis.  The anterior tibial was a dominant vessel to the foot and was  diffusely diseased 3: Right lower extremity-60 to 70% eccentric ostial right common iliac  artery stenosis.  There was no gradient noted across this.  50 to 60%  calcified right common femoral artery stenosis.  60 to 70% segmental  proximal and mid right SFA stenosis.  95% focal mid right SFA stenosis, 60  to 70% segmental distal right SFA stenosis with 0 vessel runoff. Impression: Anna Carr unfortunately has 0 vessel runoff below the knee  on the right which I suspect is the major contributor to her symptoms.   Her ostial eccentric right common iliac artery stenosis did not have a  pullback gradient after administration of intra-arterial nitroglycerin   suggesting that it was not physiologically significant and therefore  intervention was not performed.  She does have a fairly focal 95% mid  right SFA stenosis but I do not think this will significantly improve her  symptoms given her 0 vessel runoff.  Medical therapy will be recommended.   The sheath will be removed and pressure held.  Patient will be gently  hydrated although she is a dialysis patient and discharged home today  after 4 hours.  She can have dialysis on Saturday, her already scheduled  time.  She had 3 dialysis sessions already this week.  She left the lab in  stable condition.  I will see her back in the office in 2 to 3 weeks for  follow-up.  Lauro Portal. MD, [facility] 02/03/2024 8:51 AM       Past Medical History:  Diagnosis Date   Anemia, chronic disease 01/23/2017   Arteriovenous fistula, acquired ([name]) 11/02/2019   AVM (arteriovenous malformation)    C. 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   Type 2 diabetes mellitus, with long-term current use of insulin  ([name]) 03/11/2019    Past Surgical History:  Procedure Laterality Date   A/V FISTULAGRAM Left 02/17/2021   Procedure: A/V FISTULAGRAM;  Surgeon: Adine Hoof, MD;  Location: [facility] INVASIVE CV LAB;  Service: Cardiovascular;  Laterality: Left;   APPENDECTOMY     AV FISTULA PLACEMENT Left    AV FISTULA PLACEMENT Left 03/12/2021   Procedure: INSERTION OF LEFT UPPER ARM ARTERIOVENOUS (AV) GORE-TEX GRAFT;  Surgeon: Adine Hoof, MD;  Location[facility] OR;  Service: Vascular;  Laterality: Left;   COLONOSCOPY WITH PROPOFOL  N/A 03/18/2021   Procedure: COLONOSCOPY WITH PROPOFOL ;  Surgeon: Albertina Hugger, MD;  Location: [facility] ENDOSCOPY;  Service: Gastroenterology;  Laterality: N/A;   CYSTECTOMY     ENTEROSCOPY N/A 03/21/2021   Procedure: ENTEROSCOPY;  Surgeon: Albertina Hugger, MD;  Location: [facility] ENDOSCOPY;  Service: Gastroenterology;  Laterality: N/A;   ENTEROSCOPY N/A 12/20/2021   Procedure: ENTEROSCOPY;  Surgeon: Albertina Hugger, MD;  Location: [facility] ENDOSCOPY;  Service: Gastroenterology;  Laterality: N/A;   ESOPHAGOGASTRODUODENOSCOPY (EGD) WITH PROPOFOL  N/A 03/18/2021   Procedure: ESOPHAGOGASTRODUODENOSCOPY (EGD) WITH PROPOFOL ;  Surgeon: Albertina Hugger, MD;  Location: [facility] ENDOSCOPY;  Service: Gastroenterology;  Laterality: N/A;   GIVENS  CAPSULE STUDY N/A 03/19/2021   Procedure: GIVENS CAPSULE STUDY;  Surgeon: Albertina Hugger, MD;  Location: [facility] ENDOSCOPY;  Service: Gastroenterology;  Laterality: N/A;   HEMOSTASIS CLIP PLACEMENT  12/20/2021   Procedure: HEMOSTASIS CLIP PLACEMENT;  Surgeon: Albertina Hugger, MD;  Location: [facility] ENDOSCOPY;  Service: Gastroenterology;;   HOT HEMOSTASIS N/A 03/21/2021   Procedure: HOT HEMOSTASIS (ARGON PLASMA COAGULATION/BICAP);  Surgeon: Albertina Hugger, MD;  Location: [facility] ENDOSCOPY;  Service: Gastroenterology;  Laterality: N/A;   HOT HEMOSTASIS N/A 12/20/2021   Procedure: HOT HEMOSTASIS (ARGON PLASMA COAGULATION/BICAP);  Surgeon: Albertina Hugger, MD;  Location: [facility] ENDOSCOPY;  Service: Gastroenterology;  Laterality: N/A;   HYSTEROSCOPY     LOWER EXTREMITY ANGIOGRAPHY N/A 02/03/2024   Procedure: Lower Extremity Angiography;  Surgeon: Avanell Leigh, MD;  Location: [facility] INVASIVE CV LAB;  Service: Cardiovascular;  Laterality: N/A;   POLYPECTOMY  03/18/2021   Procedure: POLYPECTOMY;  Surgeon: Albertina Hugger, MD;  Location: [facility] ENDOSCOPY;  Service: Gastroenterology;;    Family History  Problem Relation Age of Onset   Diabetes Mother    Hypertension Mother    Diabetes Maternal Grandfather    Liver disease Other     Prior to Admission medications   Medication Sig Start Date End Date Taking? Authorizing Provider  acetaminophen  (TYLENOL ) 500 MG tablet Take 500 mg by mouth every 6 (six) hours as needed for mild pain (pain score 1-3) or moderate pain (pain score 4-6).   Yes [provider]  ascorbic acid (VITAMIN C) 1000 MG tablet Take 1,000 mg by mouth daily in the afternoon. 12/07/19  Yes [provider]  aspirin  EC 81 MG tablet Take 1 tablet (81 mg total) by mouth daily. Swallow whole. 12/01/23  Yes Krasowski, Robert J, MD  atorvastatin  (LIPITOR) 10 MG tablet Take  1 tablet (10 mg total) by mouth daily. Patient taking differently: Take 10 mg by mouth at bedtime. 12/01/23  Yes  Krasowski, Robert J, MD  Biotin 5000 MCG TABS Take 5,000 mcg by mouth daily in the afternoon.   Yes [provider]  carvedilol  (COREG ) 6.25 MG tablet Take 6.25 mg by mouth 2 (two) times daily. 12/07/19  Yes [provider]  cyanocobalamin (VITAMIN B12) 1000 MCG tablet Take 1,000 mcg by mouth every evening.   Yes [provider]  ferric citrate  (AURYXIA ) 1 GM 210 MG(Fe) tablet Take 210 mg by mouth 3 (three) times daily with meals.   Yes [provider]  fluticasone  (FLONASE ) 50 MCG/ACT nasal spray Place 1 spray into both nostrils daily as needed (Congestion).   Yes [provider]  furosemide  (LASIX ) 80 MG tablet Take 80 mg by mouth daily. 04/03/21  Yes [provider]  Insulin  Glargine-Lixisenatide  (SOLIQUA ) 100-33 UNT-MCG/ML SOPN Inject 12 Units into the skin in the morning. Patient taking differently: Inject 12 Units into the skin daily at 12 noon. 03/23/21  Yes Samtani, Jai-Gurmukh, MD  Lactobacillus (PROBIOTIC ACIDOPHILUS PO) Take 1 capsule by mouth in the morning.   Yes [provider]  lidocaine -prilocaine  (EMLA ) cream Apply 1 application topically as needed (prior to fisula access). Tuesday, Thursday, Saturday for dialysis 07/04/20  Yes [provider]  Omega-3 Fatty Acids (FISH OIL PO) Take 2,400 mg by mouth in the morning and at bedtime.   Yes [provider]  pantoprazole  (PROTONIX ) 40 MG tablet Take 40 mg by mouth daily.   Yes [provider]  polyethylene glycol (MIRALAX  / GLYCOLAX ) 17 g packet Take 17 g by mouth daily as needed for moderate constipation.   Yes [provider]  Vitamin E 670 MG (1000 UT) CAPS Take 1,000 Units by mouth in the morning. 01/09/20  Yes [provider]  carboxymethylcellulose (REFRESH PLUS) 0.5 % SOLN Place 1 drop into both eyes 3 (three) times daily as needed (dry eyes).    [provider]  pregabalin (LYRICA) 75 MG capsule Take 75 mg by mouth  daily. Patient not taking: Reported on 02/25/2024    [provider]    Current Facility-Administered Medications  Medication Dose Route Frequency Provider Last Rate Last Admin   acetaminophen  (TYLENOL ) tablet 650 mg  650 mg Oral Q6H PRN Atway, Rayann N, DO       Or   acetaminophen  (TYLENOL ) suppository 650 mg  650 mg Rectal Q6H PRN Atway, Rayann N, DO       carvedilol  (COREG ) tablet 6.25 mg  6.25 mg Oral BID Atway, Rayann N, DO       ferric citrate  (AURYXIA ) tablet 210 mg  210 mg Oral TID WC Atway, Rayann N, DO       [START ON 02/26/2024] furosemide  (LASIX ) tablet 80 mg  80 mg Oral Daily Atway, Rayann N, DO       insulin  aspart (novoLOG ) injection 0-5 Units  0-5 Units Subcutaneous QHS Atway, Rayann N, DO       insulin  aspart (novoLOG ) injection 0-6 Units  0-6 Units Subcutaneous TID WC Atway, Rayann N, DO       [START ON 02/26/2024] insulin  glargine-yfgn (SEMGLEE ) injection 12 Units  12 Units Subcutaneous Daily Atway, Rayann N, DO       pantoprazole  (PROTONIX ) injection 40 mg  40 mg Intravenous Q12H Atway, Rayann N, DO   40 mg at 02/25/24 1527    Allergies as of 02/25/2024   (  No Known Allergies)    Social History   Socioeconomic History   Marital status: Widowed    Spouse name: Not on file   Number of children: 3   Years of education: Not on file   Highest education level: Not on file  Occupational History   Not on file  Tobacco Use   Smoking status: Former    Types: Cigarettes   Smokeless tobacco: Never   Tobacco comments:    quit in 1973  Vaping Use   Vaping status: Never Used  Substance and Sexual Activity   Alcohol  use: Not Currently   Drug use: Never   Sexual activity: Not Currently  Other Topics Concern   Not on file  Social History Narrative   Not on file   Social Drivers of Health   Financial Resource Strain: Low Risk  (10/27/2021)   Received from [facility], [facility]   Overall Financial Resource Strain (CARDIA)    Difficulty of Paying Living  Expenses: Not hard at all  Food Insecurity: Low Risk  (05/26/2023)   Received from [facility]   Hunger Vital Sign    Worried About Running Out of Food in the Last Year: Never true    Ran Out of Food in the Last Year: Never true  Transportation Needs: Not on file (05/26/2023)  Physical Activity: Not on file  Stress: No Stress Concern Present (10/27/2021)   Received from [facility], [facility]   [employer] of Occupational Health - Occupational Stress Questionnaire    Feeling of Stress : Not at all  Social Connections: Unknown (03/10/2022)   Received from [facility], [facility]   Social Network    Social Network: Not on file  Intimate Partner Violence: Unknown (01/30/2022)   Received from [facility], [facility]   HITS    Physically Hurt: Not on file    Insult or Talk Down To: Not on file    Threaten Physical Harm: Not on file    Scream or Curse: Not on file     Code Status   Code Status: Full Code  Review of Systems: All systems reviewed and negative except where noted in HPI.  Physical Exam: Vital signs in last 24 hours: Temp:  [97.7 F (36.5 C)-98.4 F (36.9 C)] 97.8 F (36.6 C) (05/02 1523) Pulse Rate:  [64-70] 66 (05/02 1523) Resp:  [11-18] 18 (05/02 1523) BP: (98-119)/(36-67) 111/38 (05/02 1523) SpO2:  [95 %-100 %] 98 % (05/02 1523)    General:  Pleasant female in NAD Psych:  Cooperative. Normal mood and affect Eyes: Pupils equal Ears:  Normal auditory acuity Nose: No deformity, discharge or lesions Neck:  Supple, no masses felt Lungs:  Clear to auscultation.  Heart:  Regular rate, regular rhythm.  Abdomen:  Soft, nondistended, nontender, active bowel sounds, no masses felt Rectal :  Deferred Msk: Symmetrical without gross deformities.  Neurologic:  Alert, oriented, grossly normal neurologically Extremities : No edema Skin:  Intact without significant lesions.    Intake/Output from previous day: No intake/output data  recorded. Intake/Output this shift:  Total I/O In: 344 [Blood:344] Out: -    Mai Schwalbe, NP-C   02/25/2024, 3:39 PM

## 2024-02-25 NOTE — ED Triage Notes (Signed)
 Patient arrives with daughter for eval of generalized weakness. States she has been weak since Good Friday and has gotten progressively weaker since then to where she needed significant assistance to get out of vehicle at the ED. Dialysis reports drop in hemoglobin from Sunday to Tuesday and they drew more labs yesterday but do not have the results yet. Patient endorses black stool but then states that it is not any darker than usual d/t medications she takes. Also complains of jerking since started Lyrica on Tuesday.

## 2024-02-25 NOTE — TOC Initial Note (Signed)
 Transition of Care [facility]) - Initial/Assessment Note    Patient Details  Name: Anna Carr MRN: [id number] Date of Birth: 1941-03-05  Transition of Care [facility]) CM/SW Contact:    Juliane Och, LCSW Phone Number: 02/25/2024, 4:06 PM  Clinical Narrative:          4:06 PM CSW introduced self and role to patient at bedside. Patient's daughter was also present at bedside. Patient consented CSW to speak in front of daughter. Patient confirmed she resides with adult son and near adult daughter, both of whom could provide transportation and home support if needed upon discharge. Patient informed CSW that she is currently active with [facility]. Patient stated that she has a cane, walker, wheelchair, and BSC at home. Patient declined SNF history and stated that she would not want to discharge to a SNF.   Expected Discharge Plan: Home/Self Care Barriers to Discharge: Continued Medical Work up   Patient Goals and CMS Choice Patient states their goals for this hospitalization and ongoing recovery are:: to return home          Expected Discharge Plan and Services       Living arrangements for the past 2 months: Single Family Home                                      Prior Living Arrangements/Services Living arrangements for the past 2 months: Single Family Home Lives with:: Adult Children Patient language and need for interpreter reviewed:: Yes Do you feel safe going back to the place where you live?: Yes      Need for Family Participation in Patient Care: No (Comment) Care giver support system in place?: Yes (comment)   Criminal Activity/Legal Involvement Pertinent to Current Situation/Hospitalization: No - Comment as needed  Activities of Daily Living      Permission Sought/Granted Permission sought to share information with : Family Supports, Oceanographer granted to share information with : Yes, Verbal Permission Granted  Share  Information with NAME: Mosie Argue  Permission granted to share info w AGENCY: [facility]  Permission granted to share info w Relationship: Sister  Permission granted to share info w Contact Information: [phone]  Emotional Assessment Appearance:: Appears stated age Attitude/Demeanor/Rapport: Engaged Affect (typically observed): Accepting, Appropriate, Adaptable, Calm, Stable, Pleasant Orientation: : Oriented to Situation, Oriented to  Time, Oriented to Place, Oriented to Self Alcohol  / Substance Use: Not Applicable Psych Involvement: No (comment)  Admission diagnosis:  Symptomatic anemia [D64.9] Acute on chronic anemia [D64.9] Patient Active Problem List   Diagnosis Date Noted   Acute on chronic anemia 02/25/2024   Peripheral vascular disease, unspecified ([name]) critical stenosis lower extremity 12/01/2023   Aortic stenosis moderate 2024 06/21/2023   Pulmonary nodule 06/09/2023   AVM (arteriovenous malformation) of small bowel, acquired with hemorrhage    Anemia due to chronic blood loss    GI bleed 12/19/2021   Mitral regurgitation moderate to severe based on echocardiogram from 2022 11/03/2021   Pulmonary hypertension, unspecified ([name]) 11/03/2021   AVM (arteriovenous malformation) of duodenum, acquired with hemorrhage 10/30/2021   Cholelithiasis 03/30/2021   Heme positive stool    Elevated troponin 03/16/2021   Cellulitis 03/16/2021   COVID-19 10/29/2020   Contact with and (suspected) exposure to covid-19 10/28/2020   Cough, unspecified 10/28/2020   PAT (paroxysmal atrial tachycardia) ([name]) 10/04/2020   AV fistula ([name])    COPD  mixed type [facility])    Depression    Dyspnea on exertion    Hypertension    Osteoarthritis    Mild protein-calorie malnutrition ([name]) 03/26/2020   Complication of vascular dialysis catheter 03/05/2020   Fever, unspecified 01/23/2020   Diverticulosis of large intestine without perforation or abscess without bleeding 01/08/2020   History of  colonic polyps 01/08/2020   Other disorders of phosphorus metabolism 01/08/2020   Unspecified hemorrhoids 01/08/2020   ESRD on hemodialysis ([name]) 01/02/2020   GIB (gastrointestinal bleeding) 01/02/2020   Encounter for removal of sutures 12/28/2019   Coagulation defect, unspecified ([name]) 12/07/2019   Encounter for screening for respiratory tuberculosis 12/06/2019   Iron  deficiency anemia, unspecified 12/06/2019   Pruritus, unspecified 12/06/2019   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/2019   Dyspnea, unspecified 11/02/2019   Stage 5 chronic kidney disease on chronic dialysis ([name]) 09/18/2019   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   Hyponatremia 03/11/2019   Hypoxia 03/11/2019   Hypokalemia 03/11/2019   Chronic laryngitis 02/17/2017   CHF with left ventricular diastolic dysfunction, NYHA class 1 ([name]) 02/02/2017   Anemia in chronic kidney disease 01/23/2017   Type 2 diabetes mellitus with hyperlipidemia ([name]) 12/23/2015   Chronic constipation 12/22/2015   Colon polyp 12/22/2015   High risk medication use 12/22/2015   HLD (hyperlipidemia) 12/22/2015   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   Osteopenia 12/22/2015   Insulin  long-term use ([name]) 12/22/2015   Insomnia 12/22/2015   Major depressive disorder, recurrent ([name]) 12/22/2015   PCP:  Abbe Hoard., MD Pharmacy:   [facility] [address] [place], [place] - [address] [address] [place] [address] Phone: [phone] Fax: [phone]  [facility] [address] [place] [address] Phone: [phone] Fax: [phone]     Social Drivers of Health (SDOH) Social History: SDOH Screenings   Food Insecurity: Low Risk  (05/26/2023)   Received from [facility]  Housing: Low Risk  (05/26/2023)   Received from [facility]  Utilities: Low Risk  (05/26/2023)   Received from [facility]  Financial Resource Strain: Low  Risk  (10/27/2021)   Received from [facility], [facility]  Social Connections: Unknown (03/10/2022)   Received from [facility], [facility]  Stress: No Stress Concern Present (10/27/2021)   Received from [facility], [facility]  Tobacco Use: Medium Risk (02/23/2024)   SDOH Interventions:     Readmission Risk Interventions     No data to display

## 2024-02-25 NOTE — Anesthesia Preprocedure Evaluation (Signed)
 Anesthesia Evaluation  Patient identified by MRN, date of birth, ID band Patient awake    Reviewed: Allergy & Precautions, NPO status , Patient's Chart, lab work & pertinent test results, reviewed documented beta blocker date and time   History of Anesthesia Complications Negative for: history of anesthetic complications  Airway Mallampati: III  TM Distance: >3 FB Neck ROM: Full    Dental  (+) Dental Advisory Given, Edentulous Upper, Chipped   Pulmonary COPD, former smoker   Pulmonary exam normal        Cardiovascular hypertension, Pt. on home beta blockers and Pt. on medications pulmonary hypertension+ Peripheral Vascular Disease  + dysrhythmias + Valvular Problems/Murmurs MR  Rhythm:Regular Rate:Normal + Systolic murmurs  '25 Carotid US  - 40-59% bilateral ICAS  '25 TTE - EF 60 to 65%. There is moderately elevated pulmonary artery systolic pressure. Left atrial size was moderately dilated. Moderate to severe mitral valve regurgitation. Mild to moderate mitral stenosis. Tricuspid valve regurgitation is mild to moderate.     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   Renal/GU ESRF and DialysisRenal disease     Musculoskeletal  (+) Arthritis ,    Abdominal   Peds  Hematology  (+) Blood dyscrasia, anemia   Anesthesia Other Findings   Reproductive/Obstetrics                             Anesthesia Physical Anesthesia Plan  ASA: 4  Anesthesia Plan: MAC   Post-op Pain Management: Minimal or no pain anticipated   Induction:   PONV Risk Score and Plan: 2 and Propofol  infusion and Treatment may vary due to age or medical condition  Airway Management Planned: Natural Airway and Nasal Cannula  Additional Equipment:  None  Intra-op Plan:   Post-operative Plan:   Informed Consent: I have reviewed the patients History and Physical, chart, labs and discussed the procedure including the risks, benefits and alternatives for the proposed anesthesia with the patient or authorized representative who has indicated his/her understanding and acceptance.       Plan Discussed with: CRNA and Anesthesiologist  Anesthesia Plan Comments:        Anesthesia Quick Evaluation

## 2024-02-25 NOTE — ED Provider Notes (Signed)
 [place] EMERGENCY DEPARTMENT AT [facility] Provider Note   CSN: [id number] Arrival date & time: 02/25/24  2952     History  Chief Complaint  Patient presents with   Weakness   Abnormal Lab    Anna Carr is a 83 y.o. female.  HPI Patient with multiple medical problems, including heart failure, end-stage renal disease, anemia due to chronic kidney disease presents with weakness.  Patient describes it as feeling heavy all over, difficulty with activities that were previously normal.  She did have a fall 2 weeks ago, was seen, evaluated at a different healthcare facility, admitted reportedly to ICU after she was also hyperglycemic following a dose of steroids.  She presents today with family members due to concern for weakness, possible worsening of her anemia. No focal pain, no focal weakness, speech difficulty, no appreciable change in bowel movements, no vomiting.  In the interval she has been seen by her cardiologist, primary care,    Home Medications Prior to Admission medications   Medication Sig Start Date End Date Taking? Authorizing Provider  acetaminophen  (TYLENOL ) 500 MG tablet Take 500 mg by mouth every 6 (six) hours as needed for mild pain (pain score 1-3) or moderate pain (pain score 4-6).   Yes [provider]  ascorbic acid (VITAMIN C) 1000 MG tablet Take 1,000 mg by mouth daily in the afternoon. 12/07/19  Yes [provider]  aspirin  EC 81 MG tablet Take 1 tablet (81 mg total) by mouth daily. Swallow whole. 12/01/23  Yes Krasowski, Kaile Bixler J, MD  atorvastatin  (LIPITOR) 10 MG tablet Take 1 tablet (10 mg total) by mouth daily. Patient taking differently: Take 10 mg by mouth at bedtime. 12/01/23  Yes Krasowski, Shenae Bonanno J, MD  Biotin 5000 MCG TABS Take 5,000 mcg by mouth daily in the afternoon.   Yes [provider]  carvedilol  (COREG ) 6.25 MG tablet Take 6.25 mg by mouth 2 (two) times daily. 12/07/19  Yes [provider]   cyanocobalamin (VITAMIN B12) 1000 MCG tablet Take 1,000 mcg by mouth every evening.   Yes [provider]  ferric citrate  (AURYXIA ) 1 GM 210 MG(Fe) tablet Take 210 mg by mouth 3 (three) times daily with meals.   Yes [provider]  fluticasone  (FLONASE ) 50 MCG/ACT nasal spray Place 1 spray into both nostrils daily as needed (Congestion).   Yes [provider]  furosemide  (LASIX ) 80 MG tablet Take 80 mg by mouth daily. 04/03/21  Yes [provider]  Insulin  Glargine-Lixisenatide  (SOLIQUA ) 100-33 UNT-MCG/ML SOPN Inject 12 Units into the skin in the morning. Patient taking differently: Inject 12 Units into the skin daily at 12 noon. 03/23/21  Yes Samtani, Jai-Gurmukh, MD  Lactobacillus (PROBIOTIC ACIDOPHILUS PO) Take 1 capsule by mouth in the morning.   Yes [provider]  lidocaine -prilocaine  (EMLA ) cream Apply 1 application topically as needed (prior to fisula access). Tuesday, Thursday, Saturday for dialysis 07/04/20  Yes [provider]  Omega-3 Fatty Acids (FISH OIL PO) Take 2,400 mg by mouth in the morning and at bedtime.   Yes [provider]  pantoprazole  (PROTONIX ) 40 MG tablet Take 40 mg by mouth daily.   Yes [provider]  polyethylene glycol (MIRALAX  / GLYCOLAX ) 17 g packet Take 17 g by mouth daily as needed for moderate constipation.   Yes [provider]  Vitamin E 670 MG (1000 UT) CAPS Take 1,000 Units by mouth in the morning. 01/09/20  Yes [provider]  carboxymethylcellulose (  REFRESH PLUS) 0.5 % SOLN Place 1 drop into both eyes 3 (three) times daily as needed (dry eyes).    [provider]  pregabalin (LYRICA) 75 MG capsule Take 75 mg by mouth daily. Patient not taking: Reported on 02/25/2024    [provider]      Allergies    Patient has no known allergies.    Review of Systems   Review of Systems  Physical Exam Updated Vital Signs BP (!) 102/41 (BP Location: Right  Arm)   Pulse 64   Temp 98 F (36.7 C)   Resp 17   SpO2 100%  Physical Exam Vitals and nursing note reviewed.  Constitutional:      General: She is not in acute distress.    Appearance: She is well-developed. She is not ill-appearing or diaphoretic.  HENT:     Head: Normocephalic and atraumatic.  Eyes:     Conjunctiva/sclera: Conjunctivae normal.  Cardiovascular:     Rate and Rhythm: Normal rate and regular rhythm.  Pulmonary:     Effort: Pulmonary effort is normal. No respiratory distress.     Breath sounds: Normal breath sounds. No stridor.  Abdominal:     General: There is no distension.     Tenderness: There is no abdominal tenderness. There is no guarding.  Skin:    General: Skin is warm and dry.  Neurological:     Mental Status: She is alert and oriented to person, place, and time.     Cranial Nerves: No cranial nerve deficit.     Comments: Age-appropriate atrophy otherwise unremarkable, patient has her head side-to-side without immediate dizziness or decompensation.  Psychiatric:        Mood and Affect: Mood normal.     ED Results / Procedures / Treatments   Labs (all labs ordered are listed, but only abnormal results are displayed) Labs Reviewed  COMPREHENSIVE METABOLIC PANEL WITH GFR - Abnormal; Notable for the following components:      Result Value   Sodium 134 (*)    Chloride 94 (*)    Glucose, Bld 145 (*)    BUN 46 (*)    Creatinine, Ser 3.97 (*)    Total Protein 5.1 (*)    Albumin 2.5 (*)    GFR, Estimated 11 (*)    All other components within normal limits  CBC - Abnormal; Notable for the following components:   RBC 1.96 (*)    Hemoglobin 6.6 (*)    HCT 21.4 (*)    MCV 109.2 (*)    RDW 23.9 (*)    nRBC 0.7 (*)    All other components within normal limits  POC OCCULT BLOOD, ED  TYPE AND SCREEN  PREPARE RBC (CROSSMATCH)    EKG None  Radiology No results found.  Procedures Procedures    Medications Ordered in ED Medications  0.9 %   sodium chloride  infusion (Manually program via Guardrails IV Fluids) (has no administration in time range)    ED Course/ Medical Decision Making/ A&P                                 Medical Decision Making Adult female with chronic kidney disease/dialysis, anemia, heart failure now presents with weakness.  Symptoms been present for about 2 weeks, worsening, concern for worsening anemia of chronic disease, no tachycardia, hypotension, no distress, reassuring for low suspicion for acute exsanguination/GI bleed.  Patient had labs  monitoring, chart review. Cardiac 65 sinus normal pulse ox 100% room air normal  Amount and/or Complexity of Data Reviewed Independent Historian:     Details: Adult children External Data Reviewed: notes.    Details: Cardiology note from last week reviewed included below Labs: ordered. Decision-making details documented in ED Course. ECG/medicine tests: ordered and independent interpretation performed. Decision-making details documented in ED Course.  Risk Decision regarding hospitalization. Diagnosis or treatment significantly limited by social determinants of health.   12:00 PM On repeat exam patient remains hemodynamically the same.  Labs notable for hemoglobin 6.6 patient's baseline here has been 7.5, 8.  She did have 1 value recently that was greater, this may have been spurious.  Patient's labs notable for no substantial potassium abnormalities, no indication for emergent dialysis she also has no increased work of breathing, but given her history of heart failure, complicating symptomatic anemia patient will receive transfusions, be admitted for further monitoring, management. Absent evidence for hemodynamic instability, bright red blood per rectum, no indication for emergent GI consult, though this may be considered as an outpatient or pending changes while admitted.  Final Clinical Impression(s) / ED Diagnoses Final diagnoses:  Symptomatic anemia    CRITICAL CARE Performed by: Dorenda Gandy Total critical care time: 35 minutes Critical care time was exclusive of separately billable procedures and treating other patients. Critical care was necessary to treat or prevent imminent or life-threatening deterioration. Critical care was time spent personally by me on the following activities: development of treatment plan with patient and/or surrogate as well as nursing, discussions with consultants, evaluation of patient's response to treatment, examination of patient, obtaining history from patient or surrogate, ordering and performing treatments and interventions, ordering and review of laboratory studies, ordering and review of radiographic studies, pulse oximetry and re-evaluation of patient's condition.    Dorenda Gandy, MD 02/25/24 1202

## 2024-02-25 NOTE — Consult Note (Signed)
 Consultation Note   Referring Provider:  Internal Medicine Teaching Service PCP: Anna Carr::   Previously Anna Rhea, Carr          Reason for Consultation:  Anemia, FOBT+  DOA: 02/25/2024         Hospital Day: 1   ASSESSMENT    83 y.o. 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 Diff colitis and colon polyps   Acute on chronic macrocytic anemia Dark stool (? On iron ), FOBT+  History of small bowel angioectasias Suspect worsening anemia secondary to occult GI bleeding related to small bowel angioectasias  History of colon polyps ( TA and ? Sessile serrated) Up-to-date on surveillance colonoscopy  Chronic constipation  ESRD on HD  See PMH for additional medical history    PLAN:   --Check B12, folate and iron  studies -- Clear liquids, n.p.o. after midnight -- Will plan for small bowel endoscopy tomorrow.  The risks and benefits of EGD with possible biopsies were discussed with the patient who agrees to proceed.  -- Continue twice daily IV PPI -- Blood transfusion in progress.   HPI   Anna Carr is known to use for a history gastrointestinal AVMs. She was last evaluated by us  in 2023.    Anna Carr presented to the ED on 02/23/2024 with progressive weakness . In ED her hemoglobin was found to be down several grams from about 1 month ago.  She reports chronic dark stool but states it is related to a binder that she takes which apparently contains iron .  There has been no change in her bowel movements.  She has chronic nausea with intermittent vomiting.  Daughter says that she has lately been complaining more about nausea.  She is not anticoagulated.  She does not take  NSAID except for recent resumption of a daily baby aspirin .  She restarted at home she is on a daily PPI but denies a history of GERD.  She tells me that she takes it to protect her stomach from bleeding  ED workup:  BP slightly soft.  Otherwise hemodynamically stable.  Normal white count.  Hemoglobin 6.6, down from 11.3 in mid March.  Platelets 167.     Previous GI Studies   **Most recent studies  2022-01-03 Small bowel endoscopy Pertinent findings  - Normal stomach. - Normal examined duodenum. 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 Treated with argon plasma coagulation (APC). Clips (MR conditional) were placed.  12/17/21 Endo Capsule Pertinent findings:  Test complete with adequate prep Active bleeding in proximal small bowel within 5 min first duodenal image. Blood in lumen intermittently throughout image at 1 hr 27 min but culprit not visualized  May 2022 Small bowel endoscopy  - Normal stomach. - A single recently bleeding angioectasia in the duodenum. Treated with argon plasma coagulation (APC). 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 Treated with argon plasma coagulation (APC). - No specimens collected  Colonoscopy May 2022 for IDA, FOBT+ - Preparation of the colon was fair. - Two diminutive polyps  in the transverse colon and in the cecum, removed with a cold snare. Resected and retrieved. - Diverticulosis in the left colon. - Redundant colon. - The examination was otherwise normal on direct and retroflexion views.  A. COLON, CECAL AND TRANSVERSE, POLYPECTOMY:  - Tubular adenoma without high-grade dysplasia or malignancy  - Small serrated polyp, cannot distinguish between a sessile serrated polyp and hyperplastic polyp    Labs and Imaging:  Recent Labs    02/25/24 1008  WBC 4.1  HGB 6.6*  HCT 21.4*  MCV 109.2*  PLT 167   No results for input(s): "FOLATE", "VITAMINB12", "FERRITIN", "TIBC", "IRONPCTSAT" in the last 72 hours. Recent Labs    02/25/24 1008   NA 134*  K 4.4  CL 94*  CO2 26  GLUCOSE 145*  BUN 46*  CREATININE 3.97*  CALCIUM  9.0   Recent Labs    02/25/24 1008  PROT 5.1*  ALBUMIN 2.5*  AST 32  ALT 26  ALKPHOS 65  BILITOT 1.1   No results for input(s): "INR" in the last 72 hours. No results for input(s): "AFPTUMOR" in the last 72 hours.  PERIPHERAL VASCULAR CATHETERIZATION Narrative: Images from the original result were not included.  [id number] LOCATION:  FACILITY: [facility]  PHYSICIAN: Anna Carr, M.D. 1940-11-16  DATE OF PROCEDURE:  02/03/2024  DATE OF DISCHARGE:  PV Angiogram/Intervention  History obtained from chart review. Anna Carr is a 83 y.o. mildly  overweight widowed Caucasian female mother of 2 living children (1  deceased), grandmother of 2 grandchildren who is accompanied by one of her  daughters Anna Carr today.  She was referred by Anna Bores, NP in [facility]  for evaluation of PAD.  She is a cardiology patient of Dr. Tonja Carr.   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  She has a history of treated  hypertension, diabetes and hyperlipidemia.  She is never had heart attack  or stroke.  She does complain of dyspnea.  She has been on dialysis for  last 4 years.  She does have valvular heart disease with echo performed  12/29/2023 revealing normal EF, moderately elevated PA systolic pressure,  moderate to severe MR, mild to moderate MS, mild to moderate TR with no  evidence of aortic stenosis.  She has had numbness in her legs with  fatigue over the last year however in the last week or 2 she has noticed  severe limitation of ambulation related to right leg discomfort.  She had  Dopplers performed 11/24/2023 revealing a right ABI of 0.29 and a left of  0.53.  She did have a high-frequency signal in her right extrailiac artery  and moderate disease in her left SFA.  Her symptoms are  lifestyle-limiting.  She wishes to proceed with outpatient peripheral  angiography and endovascular therapy.    Pre Procedure Diagnosis: Peripheral arterial disease  Post Procedure Diagnosis: Peripheral arterial disease  Operators: Dr. Lauro Carr  Procedures Performed:  1.  Right common femoral access  2.  Abdominal aortogram/bilateral iliac angiogram/bifemoral runoff  3.  Pullback gradient across the right common and external iliac artery  using a endhole catheter after the administration of 200 mcg of  intra-arterial nitroglycerin   4.  Oblique angiogram of the right common femoral artery  PROCEDURE DESCRIPTION:   The patient was brought to the second floor [facility] [facility] cath lab in  the the postabsorptive state. She was not premedicated . Her right groin  was prepped and shaved in usual sterile fashion. Xylocaine  1% was  used for  local anesthesia. A 5 French sheath was inserted into the right common  femoral artery using standard Seldinger technique.  A 5 [place] Berenstein  2 catheter was used to cross the right common iliac artery ostium.  A 5  French pedal catheter was then placed in the distal abdominal aorta over  an 035 versa core wire.  Abdominal aortography, bilateral iliac  angiography with bifemoral runoff was performed using bolus chase, digital  subtraction and step table technique.  Omnipaque dye was used for the  entirety of the case (120 cc of contrast total of the patient).   Retrograde aortic pressure was monitored in the case.  A 5 French short  straight endhole catheter was used to perform a pullback gradient across  the ostium of the right common iliac artery as well as the right external  iliac artery after administration of 200 mcg of intra-arterial  nitroglycerin  through a set up sheath.  There was no gradient identified.  Angiographic Data:   1: Abdominal aorta-fluoroscopically calcified, small in caliber but no  significant atherosclerotic changes 2: Left lower extremity-50% left common femoral artery stenosis, diffuse  60 to 70% segmental proximal  and mid left SFA stenosis.  Tandem 95%  stenoses in the distal left SFA and proximal P1 segment.  Two-vessel  runoff via the anterior tibial and peroneal.  Peroneal had a 75% segmental  stenosis.  The anterior tibial was a dominant vessel to the foot and was  diffusely diseased 3: Right lower extremity-60 to 70% eccentric ostial right common iliac  artery stenosis.  There was no gradient noted across this.  50 to 60%  calcified right common femoral artery stenosis.  60 to 70% segmental  proximal and mid right SFA stenosis.  95% focal mid right SFA stenosis, 60  to 70% segmental distal right SFA stenosis with 0 vessel runoff. Impression: Ms. Ercolani unfortunately has 0 vessel runoff below the knee  on the right which I suspect is the major contributor to her symptoms.   Her ostial eccentric right common iliac artery stenosis did not have a  pullback gradient after administration of intra-arterial nitroglycerin   suggesting that it was not physiologically significant and therefore  intervention was not performed.  She does have a fairly focal 95% mid  right SFA stenosis but I do not think this will significantly improve her  symptoms given her 0 vessel runoff.  Medical therapy will be recommended.   The sheath will be removed and pressure held.  Patient will be gently  hydrated although she is a dialysis patient and discharged home today  after 4 hours.  She can have dialysis on Saturday, her already scheduled  time.  She had 3 dialysis sessions already this week.  She left the lab in  stable condition.  I will see her back in the office in 2 to 3 weeks for  follow-up.  Anna Carr. Carr, [facility] 02/03/2024 8:51 AM       Past Medical History:  Diagnosis Date   Anemia, chronic disease 01/23/2017   Arteriovenous fistula, acquired ([name]) 11/02/2019   AVM (arteriovenous malformation)    C. 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   Type 2 diabetes mellitus, with long-term current use of insulin  ([name]) 03/11/2019    Past Surgical History:  Procedure Laterality Date   A/V FISTULAGRAM Left 02/17/2021   Procedure: A/V FISTULAGRAM;  Surgeon: Adine Hoof, Carr;  Location: [facility] INVASIVE CV LAB;  Service: Cardiovascular;  Laterality: Left;   APPENDECTOMY     AV FISTULA PLACEMENT Left    AV FISTULA PLACEMENT Left 03/12/2021   Procedure: INSERTION OF LEFT UPPER ARM ARTERIOVENOUS (AV) GORE-TEX GRAFT;  Surgeon: Adine Hoof, Carr;  Location: [facility] OR;  Service: Vascular;  Laterality: Left;   COLONOSCOPY WITH PROPOFOL  N/A 03/18/2021   Procedure: COLONOSCOPY WITH PROPOFOL ;  Surgeon: Albertina Hugger, Carr;  Location: [facility] ENDOSCOPY;  Service: Gastroenterology;  Laterality: N/A;   CYSTECTOMY     ENTEROSCOPY N/A 03/21/2021   Procedure: ENTEROSCOPY;  Surgeon: Albertina Hugger, Carr;  Location: [facility] ENDOSCOPY;  Service: Gastroenterology;  Laterality: N/A;   ENTEROSCOPY N/A 12/20/2021   Procedure: ENTEROSCOPY;  Surgeon: Albertina Hugger, Carr;  Location: [facility] ENDOSCOPY;  Service: Gastroenterology;  Laterality: N/A;   ESOPHAGOGASTRODUODENOSCOPY (EGD) WITH PROPOFOL  N/A 03/18/2021   Procedure: ESOPHAGOGASTRODUODENOSCOPY (EGD) WITH PROPOFOL ;  Surgeon: Albertina Hugger, Carr;  Location: [facility] ENDOSCOPY;  Service: Gastroenterology;  Laterality: N/A;   GIVENS  CAPSULE STUDY N/A 03/19/2021   Procedure: GIVENS CAPSULE STUDY;  Surgeon: Albertina Hugger, Carr;  Location: [facility] ENDOSCOPY;  Service: Gastroenterology;  Laterality: N/A;   HEMOSTASIS CLIP PLACEMENT  12/20/2021   Procedure: HEMOSTASIS CLIP PLACEMENT;  Surgeon: Albertina Hugger, Carr;  Location: [facility] ENDOSCOPY;  Service: Gastroenterology;;   HOT HEMOSTASIS N/A 03/21/2021   Procedure: HOT HEMOSTASIS (ARGON PLASMA COAGULATION/BICAP);  Surgeon: Albertina Hugger, Carr;  Location: [facility] ENDOSCOPY;  Service: Gastroenterology;  Laterality: N/A;   HOT HEMOSTASIS N/A 12/20/2021   Procedure: HOT HEMOSTASIS (ARGON PLASMA COAGULATION/BICAP);  Surgeon: Albertina Hugger, Carr;  Location: [facility] ENDOSCOPY;  Service: Gastroenterology;  Laterality: N/A;   HYSTEROSCOPY     LOWER EXTREMITY ANGIOGRAPHY N/A 02/03/2024   Procedure: Lower Extremity Angiography;  Surgeon: Avanell Leigh, Carr;  Location: [facility] INVASIVE CV LAB;  Service: Cardiovascular;  Laterality: N/A;   POLYPECTOMY  03/18/2021   Procedure: POLYPECTOMY;  Surgeon: Albertina Hugger, Carr;  Location: [facility] ENDOSCOPY;  Service: Gastroenterology;;    Family History  Problem Relation Age of Onset   Diabetes Mother    Hypertension Mother    Diabetes Maternal Grandfather    Liver disease Other     Prior to Admission medications   Medication Sig Start Date End Date Taking? Authorizing Provider  acetaminophen  (TYLENOL ) 500 MG tablet Take 500 mg by mouth every 6 (six) hours as needed for mild pain (pain score 1-3) or moderate pain (pain score 4-6).   Yes Provider, Historical, Carr  ascorbic acid (VITAMIN C) 1000 MG tablet Take 1,000 mg by mouth daily in the afternoon. 12/07/19  Yes Provider, Historical, Carr  aspirin  EC 81 MG tablet Take 1 tablet (81 mg total) by mouth daily. Swallow whole. 12/01/23  Yes Krasowski, Robert J, Carr  atorvastatin  (LIPITOR) 10 MG tablet Take  1 tablet (10 mg total) by mouth daily. Patient taking differently: Take 10 mg by mouth at bedtime. 12/01/23  Yes  Krasowski, Robert J, Carr  Biotin 5000 MCG TABS Take 5,000 mcg by mouth daily in the afternoon.   Yes Provider, Historical, Carr  carvedilol  (COREG ) 6.25 MG tablet Take 6.25 mg by mouth 2 (two) times daily. 12/07/19  Yes Provider, Historical, Carr  cyanocobalamin (VITAMIN B12) 1000 MCG tablet Take 1,000 mcg by mouth every evening.   Yes Provider, Historical, Carr  ferric citrate  (AURYXIA ) 1 GM 210 MG(Fe) tablet Take 210 mg by mouth 3 (three) times daily with meals.   Yes Provider, Historical, Carr  fluticasone  (FLONASE ) 50 MCG/ACT nasal spray Place 1 spray into both nostrils daily as needed (Congestion).   Yes Provider, Historical, Carr  furosemide  (LASIX ) 80 MG tablet Take 80 mg by mouth daily. 04/03/21  Yes Provider, Historical, Carr  Insulin  Glargine-Lixisenatide  (SOLIQUA ) 100-33 UNT-MCG/ML SOPN Inject 12 Units into the skin in the morning. Patient taking differently: Inject 12 Units into the skin daily at 12 noon. 03/23/21  Yes Samtani, Jai-Gurmukh, Carr  Lactobacillus (PROBIOTIC ACIDOPHILUS PO) Take 1 capsule by mouth in the morning.   Yes Provider, Historical, Carr  lidocaine -prilocaine  (EMLA ) cream Apply 1 application topically as needed (prior to fisula access). Tuesday, Thursday, Saturday for dialysis 07/04/20  Yes Provider, Historical, Carr  Omega-3 Fatty Acids (FISH OIL PO) Take 2,400 mg by mouth in the morning and at bedtime.   Yes Provider, Historical, Carr  pantoprazole  (PROTONIX ) 40 MG tablet Take 40 mg by mouth daily.   Yes Provider, Historical, Carr  polyethylene glycol (MIRALAX  / GLYCOLAX ) 17 g packet Take 17 g by mouth daily as needed for moderate constipation.   Yes Provider, Historical, Carr  Vitamin E 670 MG (1000 UT) CAPS Take 1,000 Units by mouth in the morning. 01/09/20  Yes Provider, Historical, Carr  carboxymethylcellulose (REFRESH PLUS) 0.5 % SOLN Place 1 drop into both eyes 3 (three) times daily as needed (dry eyes).    Provider, Historical, Carr  pregabalin (LYRICA) 75 MG capsule Take 75 mg by mouth  daily. Patient not taking: Reported on 02/25/2024    Provider, Historical, Carr    Current Facility-Administered Medications  Medication Dose Route Frequency Provider Last Rate Last Admin   acetaminophen  (TYLENOL ) tablet 650 mg  650 mg Oral Q6H PRN Atway, Rayann N, DO       Or   acetaminophen  (TYLENOL ) suppository 650 mg  650 mg Rectal Q6H PRN Atway, Rayann N, DO       carvedilol  (COREG ) tablet 6.25 mg  6.25 mg Oral BID Atway, Rayann N, DO       ferric citrate  (AURYXIA ) tablet 210 mg  210 mg Oral TID WC Atway, Rayann N, DO       [START ON 02/26/2024] furosemide  (LASIX ) tablet 80 mg  80 mg Oral Daily Atway, Rayann N, DO       insulin  aspart (novoLOG ) injection 0-5 Units  0-5 Units Subcutaneous QHS Atway, Rayann N, DO       insulin  aspart (novoLOG ) injection 0-6 Units  0-6 Units Subcutaneous TID WC Atway, Rayann N, DO       [START ON 02/26/2024] insulin  glargine-yfgn (SEMGLEE ) injection 12 Units  12 Units Subcutaneous Daily Atway, Rayann N, DO       pantoprazole  (PROTONIX ) injection 40 mg  40 mg Intravenous Q12H Atway, Rayann N, DO   40 mg at 02/25/24 1527    Allergies as of 02/25/2024   (  No Known Allergies)    Social History   Socioeconomic History   Marital status: Widowed    Spouse name: Not on file   Number of children: 3   Years of education: Not on file   Highest education level: Not on file  Occupational History   Not on file  Tobacco Use   Smoking status: Former    Types: Cigarettes   Smokeless tobacco: Never   Tobacco comments:    quit in 1973  Vaping Use   Vaping status: Never Used  Substance and Sexual Activity   Alcohol  use: Not Currently   Drug use: Never   Sexual activity: Not Currently  Other Topics Concern   Not on file  Social History Narrative   Not on file   Social Drivers of Health   Financial Resource Strain: Low Risk  (10/27/2021)   Received from [facility], [facility]   Overall Financial Resource Strain (CARDIA)    Difficulty of Paying Living  Expenses: Not hard at all  Food Insecurity: Low Risk  (05/26/2023)   Received from [facility]   Hunger Vital Sign    Worried About Running Out of Food in the Last Year: Never true    Ran Out of Food in the Last Year: Never true  Transportation Needs: Not on file (05/26/2023)  Physical Activity: Not on file  Stress: No Stress Concern Present (10/27/2021)   Received from [facility], [facility]   [employer] of Occupational Health - Occupational Stress Questionnaire    Feeling of Stress : Not at all  Social Connections: Unknown (03/10/2022)   Received from [facility], [facility]   Social Network    Social Network: Not on file  Intimate Partner Violence: Unknown (01/30/2022)   Received from [facility], [facility]   HITS    Physically Hurt: Not on file    Insult or Talk Down To: Not on file    Threaten Physical Harm: Not on file    Scream or Curse: Not on file     Code Status   Code Status: Full Code  Review of Systems: All systems reviewed and negative except where noted in HPI.  Physical Exam: Vital signs in last 24 hours: Temp:  [97.7 F (36.5 C)-98.4 F (36.9 C)] 97.8 F (36.6 C) (05/02 1523) Pulse Rate:  [64-70] 66 (05/02 1523) Resp:  [11-18] 18 (05/02 1523) BP: (98-119)/(36-67) 111/38 (05/02 1523) SpO2:  [95 %-100 %] 98 % (05/02 1523)    General:  Pleasant female in NAD Psych:  Cooperative. Normal mood and affect Eyes: Pupils equal Ears:  Normal auditory acuity Nose: No deformity, discharge or lesions Neck:  Supple, no masses felt Lungs:  Clear to auscultation.  Heart:  Regular rate, regular rhythm.  Abdomen:  Soft, nondistended, nontender, active bowel sounds, no masses felt Rectal :  Deferred Msk: Symmetrical without gross deformities.  Neurologic:  Alert, oriented, grossly normal neurologically Extremities : No edema Skin:  Intact without significant lesions.    Intake/Output from previous day: No intake/output data  recorded. Intake/Output this shift:  Total I/O In: 344 [Blood:344] Out: -    Anna Schwalbe, NP-C   02/25/2024, 3:39 PM

## 2024-02-26 ENCOUNTER — Observation Stay (HOSPITAL_COMMUNITY): Admitting: Anesthesiology

## 2024-02-26 ENCOUNTER — Encounter (HOSPITAL_COMMUNITY)
Admission: EM | Disposition: A | Discharge status: Home Health | Payer: Self-pay | Source: Home / Self Care | Attending: Internal Medicine

## 2024-02-26 ENCOUNTER — Encounter (HOSPITAL_COMMUNITY): Payer: Self-pay | Admitting: Internal Medicine

## 2024-02-26 DIAGNOSIS — K31819 Angiodysplasia of stomach and duodenum without bleeding: Secondary | ICD-10-CM

## 2024-02-26 DIAGNOSIS — Z538 Procedure and treatment not carried out for other reasons: Secondary | ICD-10-CM | POA: Diagnosis not present

## 2024-02-26 DIAGNOSIS — K921 Melena: Secondary | ICD-10-CM | POA: Diagnosis not present

## 2024-02-26 DIAGNOSIS — D62 Acute posthemorrhagic anemia: Secondary | ICD-10-CM

## 2024-02-26 DIAGNOSIS — D649 Anemia, unspecified: Secondary | ICD-10-CM | POA: Diagnosis not present

## 2024-02-26 HISTORY — PX: ENTEROSCOPY: SHX5533

## 2024-02-26 LAB — BPAM RBC
Blood Product Expiration Date: 202505262359
Blood Product Expiration Date: 202505262359
ISSUE DATE / TIME: 202505021203
ISSUE DATE / TIME: 202505021544
Unit Type and Rh: 6200
Unit Type and Rh: 6200

## 2024-02-26 LAB — RENAL FUNCTION PANEL
Albumin: 2.5 g/dL — ABNORMAL LOW (ref 3.5–5.0)
Anion gap: 13 (ref 5–15)
BUN: 56 mg/dL — ABNORMAL HIGH (ref 8–23)
CO2: 24 mmol/L (ref 22–32)
Calcium: 8.8 mg/dL — ABNORMAL LOW (ref 8.9–10.3)
Chloride: 93 mmol/L — ABNORMAL LOW (ref 98–111)
Creatinine, Ser: 4.76 mg/dL — ABNORMAL HIGH (ref 0.44–1.00)
GFR, Estimated: 9 mL/min — ABNORMAL LOW (ref >60)
Glucose, Bld: 76 mg/dL (ref 70–99)
Phosphorus: 3.6 mg/dL (ref 2.5–4.6)
Potassium: 4.3 mmol/L (ref 3.5–5.1)
Sodium: 130 mmol/L — ABNORMAL LOW (ref 135–145)

## 2024-02-26 LAB — TYPE AND SCREEN
ABO/RH(D): A POS
Antibody Screen: NEGATIVE
Unit division: 0
Unit division: 0

## 2024-02-26 LAB — CBC
HCT: 27.2 % — ABNORMAL LOW (ref 36.0–46.0)
HCT: 29.7 % — ABNORMAL LOW (ref 36.0–46.0)
Hemoglobin: 9 g/dL — ABNORMAL LOW (ref 12.0–15.0)
Hemoglobin: 9.9 g/dL — ABNORMAL LOW (ref 12.0–15.0)
MCH: 33.6 pg (ref 26.0–34.0)
MCH: 34.1 pg — ABNORMAL HIGH (ref 26.0–34.0)
MCHC: 33.1 g/dL (ref 30.0–36.0)
MCHC: 33.3 g/dL (ref 30.0–36.0)
MCV: 101.5 fL — ABNORMAL HIGH (ref 80.0–100.0)
MCV: 102.4 fL — ABNORMAL HIGH (ref 80.0–100.0)
Platelets: 149 10*3/uL — ABNORMAL LOW (ref 150–400)
Platelets: 178 10*3/uL (ref 150–400)
RBC: 2.68 MIL/uL — ABNORMAL LOW (ref 3.87–5.11)
RBC: 2.9 MIL/uL — ABNORMAL LOW (ref 3.87–5.11)
RDW: 23.7 % — ABNORMAL HIGH (ref 11.5–15.5)
RDW: 23.6 % — ABNORMAL HIGH (ref 11.5–15.5)
WBC: 5.3 10*3/uL (ref 4.0–10.5)
WBC: 6 10*3/uL (ref 4.0–10.5)
nRBC: 0.8 % — ABNORMAL HIGH (ref 0.0–0.2)
nRBC: 0.5 % — ABNORMAL HIGH (ref 0.0–0.2)

## 2024-02-26 LAB — GLUCOSE, CAPILLARY
Glucose-Capillary: 81 mg/dL (ref 70–99)
Glucose-Capillary: 102 mg/dL — ABNORMAL HIGH (ref 70–99)
Glucose-Capillary: 118 mg/dL — ABNORMAL HIGH (ref 70–99)
Glucose-Capillary: 107 mg/dL — ABNORMAL HIGH (ref 70–99)
Glucose-Capillary: 195 mg/dL — ABNORMAL HIGH (ref 70–99)
Glucose-Capillary: 206 mg/dL — ABNORMAL HIGH (ref 70–99)

## 2024-02-26 LAB — HEMOGLOBIN AND HEMATOCRIT, BLOOD
HCT: 28.5 % — ABNORMAL LOW (ref 36.0–46.0)
Hemoglobin: 9.1 g/dL — ABNORMAL LOW (ref 12.0–15.0)

## 2024-02-26 SURGERY — ENTEROSCOPY
Anesthesia: Monitor Anesthesia Care

## 2024-02-26 MED ORDER — ALTEPLASE 2 MG IJ SOLR: 2.0000 mg | Freq: Once | INTRAMUSCULAR | Status: DC | PRN

## 2024-02-26 MED ORDER — NEPRO/CARBSTEADY PO LIQD: 237.0000 mL | ORAL | Status: DC | PRN

## 2024-02-26 MED ORDER — HEPARIN SODIUM (PORCINE) 1000 UNIT/ML DIALYSIS: 1000.0000 [IU] | INTRAMUSCULAR | Status: DC | PRN

## 2024-02-26 MED ORDER — LIDOCAINE HCL (PF) 1 % IJ SOLN: 5.0000 mL | INTRAMUSCULAR | Status: DC | PRN

## 2024-02-26 MED ORDER — VASOPRESSIN 20 UNIT/ML IV SOLN
INTRAVENOUS | Status: DC | PRN
  Administered 2024-02-26: 2 [IU] via INTRAVENOUS
  Administered 2024-02-26 (×2): 1 [IU] via INTRAVENOUS
  Administered 2024-02-26: 2 [IU] via INTRAVENOUS

## 2024-02-26 MED ORDER — DEXTROSE 50 % IV SOLN
1.0000 | Freq: Once | INTRAVENOUS | Status: AC
  Administered 2024-02-26: 50 mL via INTRAVENOUS
  Filled 2024-02-26: qty 50

## 2024-02-26 MED ORDER — CARVEDILOL 6.25 MG PO TABS
6.2500 mg | ORAL_TABLET | Freq: Two times a day (BID) | ORAL | Status: DC
  Administered 2024-02-27: 6.25 mg via ORAL
  Filled 2024-02-26: qty 1

## 2024-02-26 MED ORDER — PHENYLEPHRINE HCL-NACL 20-0.9 MG/250ML-% IV SOLN
INTRAVENOUS | Status: DC | PRN
  Administered 2024-02-26: 40 ug/min via INTRAVENOUS

## 2024-02-26 MED ORDER — PROPOFOL 500 MG/50ML IV EMUL
INTRAVENOUS | Status: DC | PRN
  Administered 2024-02-26: 45 ug/kg/min via INTRAVENOUS

## 2024-02-26 MED ORDER — PROPOFOL 10 MG/ML IV BOLUS
INTRAVENOUS | Status: DC | PRN
  Administered 2024-02-26: 60 mg via INTRAVENOUS

## 2024-02-26 MED ORDER — LIDOCAINE-PRILOCAINE 2.5-2.5 % EX CREA: 1.0000 | TOPICAL_CREAM | CUTANEOUS | Status: DC | PRN

## 2024-02-26 MED ORDER — ALBUMIN HUMAN 25 % IV SOLN: 25.0000 g | Freq: Once | INTRAVENOUS | Status: DC

## 2024-02-26 MED ORDER — ANTICOAGULANT SODIUM CITRATE 4% (200MG/5ML) IV SOLN: 5.0000 mL | Status: DC | PRN

## 2024-02-26 MED ORDER — LIDOCAINE 2% (20 MG/ML) 5 ML SYRINGE
INTRAMUSCULAR | Status: DC | PRN
  Administered 2024-02-26: 20 mg via INTRAVENOUS

## 2024-02-26 MED ORDER — PENTAFLUOROPROP-TETRAFLUOROETH EX AERO: 1.0000 | INHALATION_SPRAY | CUTANEOUS | Status: DC | PRN

## 2024-02-26 NOTE — Progress Notes (Signed)
 OT Cancellation Note  Patient Details Name: Anna Carr MRN: [id number] DOB: 1941-08-13   Cancelled Treatment:    Reason Eval/Treat Not Completed: Patient at procedure or test/ unavailable (off unit at EGD)  Dontarious Schaum K, OTD, OTR/L SecureChat Preferred Acute Rehab [phone]    Antionette Kirks 02/26/2024, 11:55 AM

## 2024-02-26 NOTE — Progress Notes (Signed)
 HD#0 SUBJECTIVE:  Patient Summary: Anna Carr is an 83 yo female with ESRD on HD TThS, T2DM, nonrheumatic mitral valve regurgitation, HFpEF, advanced PVD, anemia of chronic disease, and hx of duodenal/jejunal angioectasias in 2022 who presents with a few days of worsening weakness, melenic stoold, and a Hgb of 6.6. Is s/p 2 units of pRBCs, getting endoscopy today.  Overnight Events:   Hgb increased to 9.0.   Interim History:   Is hungry, wondering if she can eat.    OBJECTIVE:  Vital Signs: Vitals:   02/26/24 0007 02/26/24 0015 02/26/24 0414 02/26/24 0755  BP: (!) 106/43 (!) 108/54 (!) 116/57 123/64  Pulse: 63 66 69 73  Resp: 16 16 16 16   Temp:    97.7 F (36.5 C)  TempSrc:      SpO2: 100%  100% 98%   Supplemental O2: Room Air SpO2: 98 %  There were no vitals filed for this visit.   Intake/Output Summary (Last 24 hours) at 02/26/2024 1048 Last data filed at 02/26/2024 9604 Gross per 24 hour  Intake 1405.95 ml  Output --  Net 1405.95 ml   Net IO Since Admission: 1,405.95 mL [02/26/24 1048]  Physical Exam: Constitutional: appears well, no acute distress Cardiovascular: regular rate and rhythm, systolic murmur appreciated Pulmonary/Chest: normal work of breathing on room air, lungs clear to auscultation bilaterally Abdominal: soft, non-tender, non-distended,  MSK: 2+ BLE edema Neurological: alert & oriented x 3, no focal deficit noted Skin: warm and dry Psych: normal mood and affect  Patient Lines/Drains/Airways Status     Active Line/Drains/Airways     Name Placement date Placement time Site Days   Peripheral IV 02/25/24 20 G Anterior;Right Forearm 02/25/24  1225  Forearm  1   Peripheral IV 02/25/24 20 G Right Antecubital 02/25/24  1235  Antecubital  1   Fistula / Graft Left Upper arm Arteriovenous vein graft 03/12/21  1254  Upper arm  1081             ASSESSMENT/PLAN:  Assessment: Principal Problem:   Acute on chronic anemia Active  Problems:   Angiodysplasia of small intestine  Plan:  Acute on Chronic Anemia  Diverticulosis (2022)  Hx of duodenal/jejunal angioectasias Hgb increased from 6.6 to 9.0. Folate low but does not meet deficiency criteria, B12 WNL. Ferritin elevated with a high iron  saturation. Per patient on PO iron  and IV iron  during hemodialysis. She is also getting blood. ESA last given 02/24/2024.  Due for endoscopy this afternoon. Appreciate GI. Will be NPO until procedure.  -Endoscopy this afternoon -PT/OT  -Orthostatics before discharge, fall precautions -Will benefit from high fiber diet OP  -Pantoprazole  40mg  BID.    HFpEF  Last echo was done on 12/2023 showing 60-65 EF, with normal function of the LV. Has moderate to severe MR. No aortic stenosis. BLE edema improved but present, lungs clear to auscultation.  -HD tonight -Cw furosemide , and coreg  -Standing weights  -strict I/Os   Deconditioning  Multiple falls  Likely multifactorial due to recent hospitalizations and co-morbidities in the setting of decreased Hgb.  -PT/OT   T2DM With 12 untis Soliqua  each AM. She is NPO and glucose was 76 this AM. Hypoglycemia precautions in place.  -CBGs q4hrs  -will cw 12 units tomorrow morning -cw very sensitive SSI with night coverage  ESRD (T-TH-Sat)  Follows with [facility]. Nephrology following, appreciate their help. HD tonight.    HLD  May not benefit from lipitor given that she is ESRD. Pt is  also 83 years old. On Fish oil at home.   Best Practice: Diet: NPO IVF: Fluids: sp 2 pRBCs VTE: SCDs Start: 02/25/24 1331 Code: DNR DISPO: Anticipated discharge tomorrow pending  medical work-up .  Signature: [facility]  Internal Medicine Resident, PGY-1 [facility]  10:48 AM, 02/26/2024   Please contact the on call pager  at [phone].

## 2024-02-26 NOTE — Op Note (Signed)
 [facility] Patient Name: Anna Carr Procedure Date : 02/26/2024 MRN: [id number] Attending MD: Nannette Babe , MD, [id number] Date of Birth: 1941-09-12 CSN: [id number] Age: 83 Admit Type: Inpatient Procedure:                Small bowel enteroscopy Indications:              Acute post hemorrhagic anemia, Melena, history of                            arteriovenous malformation in the small intestine Providers:                Amber Bail. Bridgett Camps, MD, Perla Bradford, RN, Adam Adam,                            Technician Referring MD:             [facility] Medicines:                Monitored Anesthesia Care Complications:            Hypotension Estimated Blood Loss:     Estimated blood loss: none. Procedure:                Pre-Anesthesia Assessment:                           - Prior to the procedure, a History and Physical                            was performed, and patient medications and                            allergies were reviewed. The patient's tolerance of                            previous anesthesia was also reviewed. The risks                            and benefits of the procedure and the sedation                            options and risks were discussed with the patient.                            All questions were answered, and informed consent                            was obtained. Prior Anticoagulants: The patient has                            taken no anticoagulant or antiplatelet agents. ASA                            Grade Assessment: III - A patient with severe  systemic disease. After reviewing the risks and                            benefits, the patient was deemed in satisfactory                            condition to undergo the procedure.                           After obtaining informed consent, the endoscope was                            passed under direct vision. Throughout the                             procedure, the patient's blood pressure, pulse, and                            oxygen saturations were monitored continuously. The                            [id number]-HQ190L ([id number]) Olympus colonoscope was                            introduced through the mouth and advanced to the                            proximal jejunum. The small bowel enteroscopy was                            accomplished without difficulty. The patient                            tolerated the procedure poorly due to the patient's                            cardiovascular instability (hypotension). Scope In: Scope Out: Findings:      The examined esophagus was normal. View were limited as procedure was       stopped as noted above.      The entire examined stomach was normal.      Hematin (altered blood/coffee-ground-like material) was found in the       distal duodenal.      Hematin (altered blood/coffee-ground-like material) and scant fresh       blood was found in the proximal jejunum. Copious irrigation and lavage       performed with good clearance, but no source was seen. Impression:               - Normal esophagus.                           - Normal stomach.                           - Blood (coffee-ground and fresh) in the distal  duodenum and proximal jejunum.                           - Procedure stopped on advice from anesthesia team                            due to bradycardia and transient hypotension                            (responsive to vasopressor). Thus the small bowel                            was incompletely examined today during withdrawal.                           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                           - No specimens collected. Moderate Sedation:      N/A Recommendation:           - Return patient to hospital ward for ongoing care.                            - Advance diet as tolerated.                           - Monitor Hgb closely.                           - Patient will have hemodialysis later today.                           - Consideration of reattempt at enteroscopy on                            Monday depending on clinical course. Procedure Code(s):        --- Professional ---                           [phone], Small intestinal endoscopy, enteroscopy                            beyond second portion of duodenum, not including                            ileum; diagnostic, including collection of                            specimen(s) by brushing or washing, when performed                            (separate procedure) Diagnosis Code(s):        ---  Professional ---                           K92.2, Gastrointestinal hemorrhage, unspecified                           D62, Acute posthemorrhagic anemia                           K92.1, Melena (includes Hematochezia)                           K31.819, Angiodysplasia of stomach and duodenum                            without bleeding CPT copyright 2022 [employer]. All rights reserved. The codes documented in this report are preliminary and upon coder review may  be revised to meet current compliance requirements. Nannette Babe, MD 02/26/2024 12:22:12 PM This report has been signed electronically. Number of Addenda: 0

## 2024-02-26 NOTE — Interval H&P Note (Signed)
 History and Physical Interval Note: For enteroscopy today to evaluate acute on chronic anemia with known history of small bowel angioectasias The nature of the procedure, as well as the risks, benefits, and alternatives were carefully and thoroughly reviewed with the patient. Ample time for discussion and questions allowed. The patient understood, was satisfied, and agreed to proceed.      Latest Ref Rng & Units 02/26/2024    5:44 AM 02/26/2024   12:29 AM 02/25/2024   10:08 AM  CBC  WBC 4.0 - 10.5 K/uL 5.3   4.1   Hemoglobin 12.0 - 15.0 g/dL 9.0  9.1  6.6   Hematocrit 36.0 - 46.0 % 27.2  28.5  21.4   Platelets 150 - 400 K/uL 149   167      02/26/2024 11:31 AM  Anna Carr  has presented today for surgery, with the diagnosis of Heme positive stool, history of small bowel angioectasias, acute on chronic anemia.  The various methods of treatment have been discussed with the patient and family. After consideration of risks, benefits and other options for treatment, the patient has consented to  Procedure(s): ENTEROSCOPY (N/A) as a surgical intervention.  The patient's history has been reviewed, patient examined, no change in status, stable for surgery.  I have reviewed the patient's chart and labs.  Questions were answered to the patient's satisfaction.     Amber Bail Dmiya Malphrus

## 2024-02-26 NOTE — Plan of Care (Signed)

## 2024-02-26 NOTE — Care Management Obs Status (Signed)
 MEDICARE OBSERVATION STATUS NOTIFICATION   Patient Details  Name: AMIR WITZ MRN: [id number] Date of Birth: 06-20-1941   Medicare Observation Status Notification Given:  Yes    Omie Bickers, RN 02/26/2024, 1:24 PM

## 2024-02-26 NOTE — Transfer of Care (Signed)
 Immediate Anesthesia Transfer of Care Note  Patient: Anna Carr  Procedure(s) Performed: ENTEROSCOPY  Patient Location: PACU  Anesthesia Type:MAC  Level of Consciousness: awake, alert , and oriented  Airway & Oxygen Therapy: Patient Spontanous Breathing  Post-op Assessment: Report given to RN  Post vital signs: Reviewed and stable  Last Vitals:  Vitals Value Taken Time  BP 142/67 02/26/24 1215  Temp 97.6   Pulse 86 02/26/24 1215  Resp 16 02/26/24 1225  SpO2    Vitals shown include unfiled device data.  Last Pain:  Vitals:   02/26/24 1111  TempSrc: Temporal  PainSc: 0-No pain         Complications: No notable events documented. Unable to get an Spo2 reading, however patient can follow verbal commands, alert and oriented X3

## 2024-02-26 NOTE — Anesthesia Postprocedure Evaluation (Signed)
 Anesthesia Post Note  Patient: Anna Carr  Procedure(s) Performed: ENTEROSCOPY ABORTED CASE     Patient location during evaluation: PACU Anesthesia Type: MAC Level of consciousness: awake and alert Pain management: pain level controlled Vital Signs Assessment: post-procedure vital signs reviewed and stable Respiratory status: spontaneous breathing, nonlabored ventilation and respiratory function stable Cardiovascular status: stable and blood pressure returned to baseline Anesthetic complications: no Comments: See intra-op record. Case aborted mid-procedure due to persistent hypotension, bradycardia, and loss of pulse oximetry. Patient recovered, doing well in PACU.   No notable events documented.  Last Vitals:  Vitals:   02/26/24 1403 02/26/24 1602  BP: (!) 108/41 (!) 122/44  Pulse: (!) 59 65  Resp: 16 16  Temp: (!) 36.1 C (!) 36.4 C  SpO2: 100% 93%    Last Pain:  Vitals:   02/26/24 1602  TempSrc: Oral  PainSc:                  Juventino Oppenheim

## 2024-02-26 NOTE — Progress Notes (Signed)
 [facility] Progress Note   Subjective:   Planned for EGD today with HD after. Reports stomach "feels funny" but not specifically nauseous. Denies SOB, CP, dizziness, nausea. Reports she always has some edema in her legs.  Objective Vitals:   02/26/24 0007 02/26/24 0015 02/26/24 0414 02/26/24 0755  BP: (!) 106/43 (!) 108/54 (!) 116/57 123/64  Pulse: 63 66 69 73  Resp: 16 16 16 16   Temp:    97.7 F (36.5 C)  TempSrc:      SpO2: 100%  100% 98%   Physical Exam General: Alert female in NAD Heart: RRR, no murmurs, rubs or gallops Lungs: CTA bilaterally, respirations unlabored Abdomen: Soft, non-distended Extremities: 1+ edema b/l lower extremities Dialysis Access: AVG + t/b  Additional Objective Labs: Basic Metabolic Panel: Recent Labs  Lab 02/25/24 1008 02/26/24 0544  NA 134* 130*  K 4.4 4.3  CL 94* 93*  CO2 26 24  GLUCOSE 145* 76  BUN 46* 56*  CREATININE 3.97* 4.76*  CALCIUM  9.0 8.8*  PHOS  --  3.6   Liver Function Tests: Recent Labs  Lab 02/25/24 1008 02/26/24 0544  AST 32  --   ALT 26  --   ALKPHOS 65  --   BILITOT 1.1  --   PROT 5.1*  --   ALBUMIN 2.5* 2.5*   No results for input(s): "LIPASE", "AMYLASE" in the last 168 hours. CBC: Recent Labs  Lab 02/25/24 1008 02/26/24 0029 02/26/24 0544  WBC 4.1  --  5.3  HGB 6.6* 9.1* 9.0*  HCT 21.4* 28.5* 27.2*  MCV 109.2*  --  101.5*  PLT 167  --  149*   Blood Culture    Component Value Date/Time   SDES BLOOD RIGHT HAND 03/16/2021 0046   SPECREQUEST  03/16/2021 0046    BOTTLES DRAWN AEROBIC ONLY Blood Culture results may not be optimal due to an inadequate volume of blood received in culture bottles   CULT  03/16/2021 0046    NO GROWTH 5 DAYS Performed at [facility], [address] [place], [place] [address]    REPTSTATUS 03/21/2021 FINAL 03/16/2021 0046    Cardiac Enzymes: No results for input(s): "CKTOTAL", "CKMB", "CKMBINDEX", "TROPONINI" in the last 168  hours. CBG: Recent Labs  Lab 02/25/24 1612 02/25/24 2149 02/26/24 0752  GLUCAP 88 130* 81   Iron  Studies:  Recent Labs    02/25/24 1008  IRON  149  TIBC 253  FERRITIN 1,495*   @lablastinr3 @ Studies/Results: No results found. Medications:  sodium chloride  20 mL/hr at 02/25/24 2045   anticoagulant sodium citrate      carvedilol   6.25 mg Oral BID   Chlorhexidine  Gluconate Cloth  6 each Topical Q0600   ferric citrate   210 mg Oral TID WC   furosemide   80 mg Oral Daily   insulin  aspart  0-5 Units Subcutaneous QHS   insulin  aspart  0-6 Units Subcutaneous TID WC   insulin  glargine-yfgn  12 Units Subcutaneous Daily   pantoprazole  (PROTONIX ) IV  40 mg Intravenous Q12H    Dialysis Orders: [facility] 3.5h   B400    70.6kg    LUA AVG   Heparin  none Last OP HD 5/01, post wt 70.9 Comes off 0-2kg over, bp's wnl on HD Mircera 200mcg IV q 2 weeks- last dose 02/24/24  Assessment/Plan: Acute on chronic anemia/ generalized weakness: FOBT+, Hb 6.6 on admission. GI planning endoscopy today. 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 HD today HTN: BP soft/stable.  Volume: Does have bilateral  lower extremity edema. UF with HD as BP allows today Anemia of esrd: s/p PRBC transfusion. ESA given 02/24/24 Secondary hyperparathyroidism: CCa in range, cont binders ac tid.  IDDM: per pmd  Anna Burner, PA-C 02/26/2024, 10:05 AM  [facility] Pager: [phone]

## 2024-02-26 NOTE — Evaluation (Signed)
 Occupational Therapy Evaluation Patient Details Name: Anna Carr MRN: [id number] DOB: 1941-07-17 Today's Date: 02/26/2024   History of Present Illness   83 yo female who presents 5/2 with a few days of worsening weakness. Acute on Chronic Anemia. Multiple falls. PMH: ESRD on HD TThS, T2DM, nonrheumatic mitral valve regurgitation, HFpEF, advanced PVD, anemia of chronic disease, and hx of duodenal/jejunal angioectasias in 2022.     Clinical Impressions Pt reports using cane PRN for mobility , able to perform ADLs, lives with son and has close to 24/7 assist, family takes her to HD.Pt needing up to mod A for ADLs, mod I for bed mobility and CGA for transfers with RW. Pt reports mild dizziness upon return to bed, BP WNL. Pt presenting with impairments listed below, will follow acutely. Recommend HHOT at d/c.      If plan is discharge home, recommend the following:   A little help with walking and/or transfers;A little help with bathing/dressing/bathroom;Assistance with cooking/housework;Direct supervision/assist for medications management;Direct supervision/assist for financial management;Assist for transportation;Help with stairs or ramp for entrance     Functional Status Assessment   Patient has had a recent decline in their functional status and demonstrates the ability to make significant improvements in function in a reasonable and predictable amount of time.     Equipment Recommendations   Tub/shower seat     Recommendations for Other Services   PT consult     Precautions/Restrictions   Precautions Precautions: Fall Recall of Precautions/Restrictions: Intact Restrictions Weight Bearing Restrictions Per Provider Order: No     Mobility Bed Mobility Overal bed mobility: Modified Independent                  Transfers Overall transfer level: Needs assistance Equipment used: Rolling walker (2 wheels) Transfers: Sit to/from Stand Sit to Stand:  Contact guard assist                  Balance Overall balance assessment: Mild deficits observed, not formally tested                                         ADL either performed or assessed with clinical judgement   ADL Overall ADL's : Needs assistance/impaired Eating/Feeding: Set up;Sitting   Grooming: Set up;Standing;Oral care   Upper Body Bathing: Minimal assistance;Standing;Sitting   Lower Body Bathing: Moderate assistance;Sitting/lateral leans;Sit to/from stand   Upper Body Dressing : Minimal assistance;Sitting;Standing   Lower Body Dressing: Moderate assistance;Sitting/lateral leans;Sit to/from stand   Toilet Transfer: Contact guard assist;Ambulation;Rolling walker (2 wheels);Regular Toilet   Toileting- Clothing Manipulation and Hygiene: Contact guard assist;Sitting/lateral lean   Tub/ Engineer, structural: Contact guard assist   Functional mobility during ADLs: Contact guard assist;Rolling walker (2 wheels)       Vision   Vision Assessment?: No apparent visual deficits     Perception Perception: Not tested       Praxis Praxis: Not tested       Pertinent Vitals/Pain       Extremity/Trunk Assessment Upper Extremity Assessment Upper Extremity Assessment: Generalized weakness   Lower Extremity Assessment Lower Extremity Assessment: Defer to PT evaluation   Cervical / Trunk Assessment Cervical / Trunk Assessment: Normal   Communication Communication Communication: No apparent difficulties   Cognition Arousal: Alert Behavior During Therapy: WFL for tasks assessed/performed Cognition: No apparent impairments  Following commands: Intact       Cueing  General Comments   Cueing Techniques: Verbal cues  VSS   Exercises     Shoulder Instructions      Home Living Family/patient expects to be discharged to:: Private residence Living Arrangements: Children (son & daughter next  dor) Available Help at Discharge: Family;Available 24 hours/day Type of Home: House Home Access: Ramped entrance     Home Layout: One level     Bathroom Shower/Tub: Tub/shower unit;Walk-in shower   Bathroom Toilet: Standard Bathroom Accessibility: No   Home Equipment: Agricultural consultant (2 wheels);Grab bars - tub/shower;Wheelchair Financial trader (4 wheels);BSC/3in1;Cane - single point          Prior Functioning/Environment Prior Level of Function : Needs assist;History of Falls (last six months)             Mobility Comments: cane PRN ADLs Comments: able to bath/dress herself; does a little cooking. Does not clean.    OT Problem List: Decreased strength;Decreased activity tolerance;Decreased range of motion;Impaired balance (sitting and/or standing);Decreased coordination;Impaired vision/perception;Decreased safety awareness   OT Treatment/Interventions: Self-care/ADL training;Therapeutic exercise;Energy conservation;DME and/or AE instruction;Therapeutic activities;Patient/family education;Balance training      OT Goals(Current goals can be found in the care plan section)   Acute Rehab OT Goals Patient Stated Goal: none stated OT Goal Formulation: With patient Time For Goal Achievement: 03/11/24 Potential to Achieve Goals: Good ADL Goals Pt Will Perform Upper Body Dressing: with modified independence;sitting Pt Will Perform Lower Body Dressing: with modified independence;sitting/lateral leans;sit to/from stand Pt Will Transfer to Toilet: with modified independence;ambulating;regular height toilet Pt Will Perform Tub/Shower Transfer: Tub transfer;Shower transfer;with modified independence;ambulating   OT Frequency:  Min 2X/week    Co-evaluation              AM-PAC OT "6 Clicks" Daily Activity     Outcome Measure Help from another person eating meals?: None Help from another person taking care of personal grooming?: A Little Help from another person  toileting, which includes using toliet, bedpan, or urinal?: A Little Help from another person bathing (including washing, rinsing, drying)?: A Lot Help from another person to put on and taking off regular upper body clothing?: A Little Help from another person to put on and taking off regular lower body clothing?: A Lot 6 Click Score: 17   End of Session Equipment Utilized During Treatment: Gait belt;Rolling walker (2 wheels) Nurse Communication: Mobility status  Activity Tolerance: Patient tolerated treatment well Patient left: in bed;with call bell/phone within reach;with bed alarm set;with family/visitor present  OT Visit Diagnosis: Unsteadiness on feet (R26.81);Other abnormalities of gait and mobility (R26.89);Muscle weakness (generalized) (M62.81);History of falling (Z91.81)                Time: 0623-7628 OT Time Calculation (min): 30 min Charges:  OT General Charges $OT Visit: 1 Visit OT Evaluation $OT Eval Moderate Complexity: 1 Mod OT Treatments $Self Care/Home Management : 8-22 mins  Quayshawn Nin K, OTD, OTR/L SecureChat Preferred Acute Rehab [phone]   Benedict Brain Koonce 02/26/2024, 4:44 PM

## 2024-02-27 DIAGNOSIS — D539 Nutritional anemia, unspecified: Secondary | ICD-10-CM | POA: Diagnosis not present

## 2024-02-27 DIAGNOSIS — D649 Anemia, unspecified: Secondary | ICD-10-CM | POA: Diagnosis not present

## 2024-02-27 DIAGNOSIS — R195 Other fecal abnormalities: Secondary | ICD-10-CM | POA: Diagnosis not present

## 2024-02-27 DIAGNOSIS — N186 End stage renal disease: Secondary | ICD-10-CM | POA: Diagnosis not present

## 2024-02-27 DIAGNOSIS — K5909 Other constipation: Secondary | ICD-10-CM | POA: Diagnosis not present

## 2024-02-27 LAB — HEPATITIS B SURFACE ANTIBODY, QUANTITATIVE: Hep B S AB Quant (Post): <3.5 m[IU]/mL — ABNORMAL LOW

## 2024-02-27 LAB — GLUCOSE, CAPILLARY
Glucose-Capillary: 106 mg/dL — ABNORMAL HIGH (ref 70–99)
Glucose-Capillary: 273 mg/dL — ABNORMAL HIGH (ref 70–99)
Glucose-Capillary: 183 mg/dL — ABNORMAL HIGH (ref 70–99)
Glucose-Capillary: 162 mg/dL — ABNORMAL HIGH (ref 70–99)

## 2024-02-27 LAB — CBC
HCT: 28.9 % — ABNORMAL LOW (ref 36.0–46.0)
Hemoglobin: 9.5 g/dL — ABNORMAL LOW (ref 12.0–15.0)
MCH: 33.7 pg (ref 26.0–34.0)
MCHC: 32.9 g/dL (ref 30.0–36.0)
MCV: 102.5 fL — ABNORMAL HIGH (ref 80.0–100.0)
Platelets: 159 10*3/uL (ref 150–400)
RBC: 2.82 MIL/uL — ABNORMAL LOW (ref 3.87–5.11)
RDW: 23.9 % — ABNORMAL HIGH (ref 11.5–15.5)
WBC: 5.5 10*3/uL (ref 4.0–10.5)
nRBC: 0.9 % — ABNORMAL HIGH (ref 0.0–0.2)

## 2024-02-27 LAB — RENAL FUNCTION PANEL
Albumin: 2.6 g/dL — ABNORMAL LOW (ref 3.5–5.0)
Anion gap: 14 (ref 5–15)
BUN: 68 mg/dL — ABNORMAL HIGH (ref 8–23)
CO2: 24 mmol/L (ref 22–32)
Calcium: 8.9 mg/dL (ref 8.9–10.3)
Chloride: 91 mmol/L — ABNORMAL LOW (ref 98–111)
Creatinine, Ser: 6.18 mg/dL — ABNORMAL HIGH (ref 0.44–1.00)
GFR, Estimated: 6 mL/min — ABNORMAL LOW (ref >60)
Glucose, Bld: 183 mg/dL — ABNORMAL HIGH (ref 70–99)
Phosphorus: 4.6 mg/dL (ref 2.5–4.6)
Potassium: 5.1 mmol/L (ref 3.5–5.1)
Sodium: 129 mmol/L — ABNORMAL LOW (ref 135–145)

## 2024-02-27 NOTE — H&P (View-Only) (Signed)
 Daily Progress Note  DOA: 02/25/2024 Hospital Day: 3   Chief Complaint: anemia, FOBT+  ASSESSMENT    83 y.o. 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 Diff colitis and colon polyps   Acute on chronic macrocytic anemia Chronic dark stool  (on iron ), FOBT+  History of small bowel angioectasias Incomplete small bowel enteroscopy yesterday, Procedure stopped  due to bradycardia and transient hypotension.  Blood (coffee-ground and fresh) in the distal duodenum and proximal jejunum. The small bowel was incompletely examined.  Hgb stable at 9.5.    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 She feels okay today, just a little tired after dialysis. HR running 60 -70s today. On a beta blocker BID. I may reach out to Cardiology about holding am dose in preparation for procedure.     Subjective   Feels okay, just tired after dialysis    Objective   GI Studies:    02/26/24 small bowel enteroscopy  ( aborted) - Normal esophagus. - Normal stomach. - Blood (coffee-ground and fresh) in the distal duodenum and proximal jejunum. - Procedure stopped on advice from anesthesia team due to bradycardia and transient hypotension (responsive to vasopressor). Thus the small bowel was incompletely examined.    Recent Labs    02/26/24 0544 02/26/24 1715 02/27/24 0523  WBC 5.3 6.0 5.5  HGB 9.0* 9.9* 9.5*  HCT 27.2* 29.7* 28.9*  MCV 101.5* 102.4* 102.5*  PLT 149* 178 159   Recent Labs    02/25/24 1008 02/25/24 2017  FOLATE 5.6*  --   VITAMINB12  --  3,219*  FERRITIN 1,495*  --   TIBC 253  --   IRONPCTSAT 59*  --     Recent Labs    02/25/24 1008 02/26/24 0544 02/27/24 0523  NA 134* 130* 129*  K 4.4 4.3 5.1  CL 94* 93* 91*  CO2 26 24 24   GLUCOSE 145* 76 183*  BUN 46* 56* 68*  CREATININE 3.97* 4.76* 6.18*  CALCIUM  9.0 8.8* 8.9   Recent Labs    02/25/24 1008 02/26/24 0544 02/27/24 0523  PROT 5.1*  --   --   ALBUMIN 2.5* 2.5* 2.6*  AST 32  --   --   ALT 26  --   --   ALKPHOS 65  --   --   BILITOT 1.1  --   --    No results for input(s): "INR" in the last 72 hours. No results for input(s): "AFPTUMOR" in the last 72 hours.   No results for input(s): "ANA" in the last 72 hours.  Imaging:  PERIPHERAL VASCULAR CATHETERIZATION Narrative: Images from the original result were not included.  [id number] LOCATION:  FACILITY: [facility]  PHYSICIAN: Anna Carr, M.D. 1941-05-20  DATE OF PROCEDURE:  02/03/2024  DATE OF DISCHARGE:  PV Angiogram/Intervention  History obtained from chart review. Anna Carr is a 83 y.o. mildly  overweight widowed Caucasian female mother of 2 living children (1  deceased), grandmother of 2 grandchildren who is accompanied by one of her  daughters Anna Carr today.  She was referred by Anna Bores, NP in [facility]  for evaluation of PAD.  She is a cardiology patient of Dr. Tonja Carr.   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  She has a history of treated  hypertension, diabetes and hyperlipidemia.  She is never had heart attack  or stroke.  She does complain of dyspnea.  She has been on dialysis for  last 4 years.  She does have valvular heart disease with echo performed  12/29/2023 revealing normal EF, moderately elevated PA systolic pressure,  moderate to severe MR, mild to moderate MS, mild to moderate TR with no  evidence of aortic stenosis.  She has had numbness in her legs with  fatigue over the last year however in the last week or 2 she has noticed  severe limitation of ambulation related to right leg discomfort.  She had  Dopplers performed  11/24/2023 revealing a right ABI of 0.29 and a left of  0.53.  She did have a high-frequency signal in her right extrailiac artery  and moderate disease in her left SFA.  Her symptoms are  lifestyle-limiting.  She wishes to proceed with outpatient peripheral  angiography and endovascular therapy.   Pre Procedure Diagnosis: Peripheral arterial disease  Post Procedure Diagnosis: Peripheral arterial disease  Operators: Dr. Lauro Carr  Procedures Performed:  1.  Right common femoral access  2.  Abdominal aortogram/bilateral iliac angiogram/bifemoral runoff  3.  Pullback gradient across the right common and external iliac artery  using a endhole catheter after the administration of 200 mcg of  intra-arterial nitroglycerin   4.  Oblique angiogram of the right common femoral artery  PROCEDURE DESCRIPTION:   The patient was brought to the second floor [facility] cath lab in  the the postabsorptive state. She was not premedicated . Her right groin  was prepped and shaved in usual sterile fashion. Xylocaine  1% was used for  local anesthesia. A 5 French sheath was inserted into the right common  femoral artery using standard Seldinger technique.  A 5 [place] Berenstein  2 catheter was used to cross the right common iliac artery ostium.  A 5  French pedal catheter was then placed in the distal abdominal aorta over  an 035 versa core wire.  Abdominal aortography, bilateral iliac  angiography with bifemoral runoff was performed using bolus chase, digital  subtraction and step table technique.  Omnipaque dye was used for the  entirety of the case (120 cc of contrast total of the patient).   Retrograde aortic pressure was monitored in the case.  A 5 French short  straight endhole catheter was used to perform a pullback gradient across  the ostium of the right common iliac artery as well as the right external  iliac artery after administration of 200 mcg of intra-arterial  nitroglycerin   through a set up sheath.  There was no gradient identified.  Angiographic Data:   1: Abdominal aorta-fluoroscopically calcified, small in caliber but no  significant atherosclerotic changes 2: Left lower extremity-50% left common femoral artery stenosis, diffuse  60 to 70% segmental proximal and mid left SFA stenosis.  Tandem 95%  stenoses in the distal left SFA and proximal P1 segment.  Two-vessel  runoff via the anterior tibial and peroneal.  Peroneal had a 75% segmental  stenosis.  The anterior tibial was a dominant vessel to the foot and was  diffusely diseased 3: Right lower extremity-60 to 70% eccentric ostial right common iliac  artery stenosis.  There was no gradient noted across this.  50 to 60%  calcified right common femoral artery  stenosis.  60 to 70% segmental  proximal and mid right SFA stenosis.  95% focal mid right SFA stenosis, 60  to 70% segmental distal right SFA stenosis with 0 vessel runoff. Impression: Ms. Burford unfortunately has 0 vessel runoff below the knee  on the right which I suspect is the major contributor to her symptoms.   Her ostial eccentric right common iliac artery stenosis did not have a  pullback gradient after administration of intra-arterial nitroglycerin   suggesting that it was not physiologically significant and therefore  intervention was not performed.  She does have a fairly focal 95% mid  right SFA stenosis but I do not think this will significantly improve her  symptoms given her 0 vessel runoff.  Medical therapy will be recommended.   The sheath will be removed and pressure held.  Patient will be gently  hydrated although she is a dialysis patient and discharged home today  after 4 hours.  She can have dialysis on Saturday, her already scheduled  time.  She had 3 dialysis sessions already this week.  She left the lab in  stable condition.  I will see her back in the office in 2 to 3 weeks for  follow-up.  Anna Carr. MD,  [facility] 02/03/2024 8:51 AM        Scheduled inpatient medications:   carvedilol   6.25 mg Oral BID   Chlorhexidine  Gluconate Cloth  6 each Topical Q0600   ferric citrate   210 mg Oral TID WC   furosemide   80 mg Oral Daily   insulin  aspart  0-5 Units Subcutaneous QHS   insulin  aspart  0-6 Units Subcutaneous TID WC   insulin  glargine-yfgn  12 Units Subcutaneous Daily   pantoprazole  (PROTONIX ) IV  40 mg Intravenous Q12H   Continuous inpatient infusions:   albumin human     PRN inpatient medications: acetaminophen  **OR** acetaminophen   Vital signs in last 24 hours: Temp:  [97.4 F (36.3 C)-98 F (36.7 C)] 97.9 F (36.6 C) (05/04 1102) Pulse Rate:  [60-90] 75 (05/04 1102) Resp:  [12-19] 18 (05/04 1102) BP: (101-141)/(37-95) 120/50 (05/04 1102) SpO2:  [93 %-100 %] 99 % (05/04 1102) Weight:  [71.8 kg-74.8 kg] 71.8 kg (05/04 1102) Last BM Date : 02/26/24  Intake/Output Summary (Last 24 hours) at 02/27/2024 1431 Last data filed at 02/27/2024 1255 Gross per 24 hour  Intake 420 ml  Output 3000 ml  Net -2580 ml    Intake/Output from previous day: 05/03 0701 - 05/04 0700 In: 580 [P.O.:180; I.V.:400] Out: -  Intake/Output this shift: Total I/O In: 240 [P.O.:240] Out: 3000 [Other:3000]   Physical Exam:  General: Alert female in NAD Heart:  Regular rate.  Pulmonary: Normal respiratory effort Abdomen: Soft, nondistended, nontender. Normal bowel sounds. Extremities: 1-2 + BLE edema  Neurologic: Alert and oriented Psych: Pleasant. Cooperative     LOS: 0 days   Anna Carr ,NP 02/27/2024, 2:31 PM

## 2024-02-27 NOTE — Procedures (Signed)
 Received patient in bed to unit.  Alert and oriented.  Informed consent signed and in chart.   TX duration: 3 hours  Patient tolerated well.  Transported back to the room  Alert, without acute distress.  Hand-off given to patient's nurse.   Access used: left graft Access issues: none  Total UF removed: 3 liters Medication(s) given: none   Clover Dao, RN [facility]

## 2024-02-27 NOTE — Progress Notes (Signed)
   HD#0 SUBJECTIVE:  Patient Summary: Anna Carr is a 83 y.o. with a pertinent PMH of ESRD on HD TThS, T2DM, nonrheumatic mitral valve regurgitation, HFpEF, advanced PVD, anemia of chronic disease, and hx of duodenal/jejunal angioectasias in 2022 who presented with weakness and melenic stool and admitted for concern for UGI bleed.   Overnight Events: No events overnight  Interim History: Anna Carr was seen at HD this morning. She denies any abdominal pain, but notes that her throat is a little sore after her procedure yesterday. Notes that her bowel movements are still quite dark, as they have been.   OBJECTIVE:  Vital Signs: Vitals:   02/26/24 2026 02/27/24 0017 02/27/24 0500 02/27/24 0541  BP: 112/75 (!) 107/54  101/63  Pulse: 66 65  90  Resp: 19 18  19   Temp: (!) 97.4 F (36.3 C) 98 F (36.7 C)  97.9 F (36.6 C)  TempSrc:  Oral    SpO2: 100% 100%  100%  Weight:   74.8 kg    Supplemental O2: Room Air SpO2: 100 % O2 Flow Rate (L/min): 6 L/min  Filed Weights   02/27/24 0500  Weight: 74.8 kg     Intake/Output Summary (Last 24 hours) at 02/27/2024 4098 Last data filed at 02/27/2024 0014 Gross per 24 hour  Intake 580 ml  Output --  Net 580 ml   Net IO Since Admission: 1,985.95 mL [02/27/24 0633]  Physical Exam: General: Pleasant, well-appearing elderly female laying in bed. No acute distress. CV: RRR. Systolic murmur Pulmonary: Normal work of breathing on room air Abdominal: Soft, nontender, nondistended.  Neuro: A&Ox3.  Psych: Normal mood and affect     ASSESSMENT/PLAN:  Assessment: Principal Problem:   Acute on chronic anemia Active Problems:   Angiodysplasia of small intestine   Plan: Acute on Chronic Anemia  Diverticulosis (2022)  Hx of duodenal/jejunal angioectasias Hb remains stable at 9.5 from 9.9 yesterday. She is s/p 2u PRBC this admission. EGD yesterday showed blood (coffee-ground and fresh) in the distal duodenum and proximal  jejunum although procedure had to be stopped early due to hypotension and bradycardia - may re-attempt Monday depending on clinical course. - GI consulted, appreciate recs - Protonix  40 mg BID  ESRD on HD TThS HD today per nephro.   HFpEF Last echo was done on 12/2023 showing 60-65 EF, with normal function of the LV. Has moderate to severe MR. No aortic stenosis.  - Continue lasix  and coreg  - Daily weights, strict I&Os  T2DM - Continue glargine 12u daily + SSI   Best Practice: Diet: Renal diet IVF: Fluids: none VTE: SCDs Start: 02/25/24 1331 Code: Full AB: None Therapy Recs: Home Health DISPO: Anticipated discharge in 1-2 days to Home pending  medical stability .  Signature: Chinyere Galiano, D.O.  Internal Medicine Resident, PGY-3 [facility]  Pager: [phone] 6:33 AM, 02/27/2024   Please contact the on call pager after 5 pm and on weekends at [phone].

## 2024-02-27 NOTE — Plan of Care (Signed)

## 2024-02-27 NOTE — Progress Notes (Signed)
 Daily Progress Note  DOA: 02/25/2024 Hospital Day: 3   Chief Complaint: anemia, FOBT+  ASSESSMENT    83 y.o. 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 Diff colitis and colon polyps   Acute on chronic macrocytic anemia Chronic dark stool  (on iron ), FOBT+  History of small bowel angioectasias Incomplete small bowel enteroscopy yesterday, Procedure stopped  due to bradycardia and transient hypotension.  Blood (coffee-ground and fresh) in the distal duodenum and proximal jejunum. The small bowel was incompletely examined.  Hgb stable at 9.5.    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 She feels okay today, just a little tired after dialysis. HR running 60 -70s today. On a beta blocker BID. I may reach out to Cardiology about holding am dose in preparation for procedure.     Subjective   Feels okay, just tired after dialysis    Objective   GI Studies:    02/26/24 small bowel enteroscopy  ( aborted) - Normal esophagus. - Normal stomach. - Blood (coffee-ground and fresh) in the distal duodenum and proximal jejunum. - Procedure stopped on advice from anesthesia team due to bradycardia and transient hypotension (responsive to vasopressor). Thus the small bowel was incompletely examined.    Recent Labs    02/26/24 0544 02/26/24 1715 02/27/24 0523  WBC 5.3 6.0 5.5  HGB 9.0* 9.9* 9.5*  HCT 27.2* 29.7* 28.9*  MCV 101.5* 102.4* 102.5*  PLT 149* 178 159   Recent Labs    02/25/24 1008 02/25/24 2017  FOLATE 5.6*  --   VITAMINB12  --  3,219*  FERRITIN 1,495*  --   TIBC 253  --   IRONPCTSAT 59*  --     Recent Labs    02/25/24 1008 02/26/24 0544 02/27/24 0523  NA 134* 130* 129*  K 4.4 4.3 5.1  CL 94* 93* 91*  CO2 26 24 24   GLUCOSE 145* 76 183*  BUN 46* 56* 68*  CREATININE 3.97* 4.76* 6.18*  CALCIUM  9.0 8.8* 8.9   Recent Labs    02/25/24 1008 02/26/24 0544 02/27/24 0523  PROT 5.1*  --   --   ALBUMIN 2.5* 2.5* 2.6*  AST 32  --   --   ALT 26  --   --   ALKPHOS 65  --   --   BILITOT 1.1  --   --    No results for input(s): "INR" in the last 72 hours. No results for input(s): "AFPTUMOR" in the last 72 hours.   No results for input(s): "ANA" in the last 72 hours.  Imaging:  PERIPHERAL VASCULAR CATHETERIZATION Narrative: Images from the original result were not included.  [id number] LOCATION:  FACILITY: [facility]  PHYSICIAN: Lauro Portal, M.D. 1941-05-20  DATE OF PROCEDURE:  02/03/2024  DATE OF DISCHARGE:  PV Angiogram/Intervention  History obtained from chart review. Anna Carr is a 83 y.o. mildly  overweight widowed Caucasian female mother of 2 living children (1  deceased), grandmother of 2 grandchildren who is accompanied by one of her  daughters [name] today.  She was referred by Pattricia Bores, NP in [facility]  for evaluation of PAD.  She is a cardiology patient of Dr. Tonja Fray.   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  She has a history of treated  hypertension, diabetes and hyperlipidemia.  She is never had heart attack  or stroke.  She does complain of dyspnea.  She has been on dialysis for  last 4 years.  She does have valvular heart disease with echo performed  12/29/2023 revealing normal EF, moderately elevated PA systolic pressure,  moderate to severe MR, mild to moderate MS, mild to moderate TR with no  evidence of aortic stenosis.  She has had numbness in her legs with  fatigue over the last year however in the last week or 2 she has noticed  severe limitation of ambulation related to right leg discomfort.  She had  Dopplers performed  11/24/2023 revealing a right ABI of 0.29 and a left of  0.53.  She did have a high-frequency signal in her right extrailiac artery  and moderate disease in her left SFA.  Her symptoms are  lifestyle-limiting.  She wishes to proceed with outpatient peripheral  angiography and endovascular therapy.   Pre Procedure Diagnosis: Peripheral arterial disease  Post Procedure Diagnosis: Peripheral arterial disease  Operators: Dr. Lauro Portal  Procedures Performed:  1.  Right common femoral access  2.  Abdominal aortogram/bilateral iliac angiogram/bifemoral runoff  3.  Pullback gradient across the right common and external iliac artery  using a endhole catheter after the administration of 200 mcg of  intra-arterial nitroglycerin   4.  Oblique angiogram of the right common femoral artery  PROCEDURE DESCRIPTION:   The patient was brought to the second floor [facility] lab in  the the postabsorptive state. She was not premedicated . Her right groin  was prepped and shaved in usual sterile fashion. Xylocaine  1% was used for  local anesthesia. A 5 French sheath was inserted into the right common  femoral artery using standard Seldinger technique.  A 5 [place] Berenstein  2 catheter was used to cross the right common iliac artery ostium.  A 5  French pedal catheter was then placed in the distal abdominal aorta over  an 035 versa core wire.  Abdominal aortography, bilateral iliac  angiography with bifemoral runoff was performed using bolus chase, digital  subtraction and step table technique.  Omnipaque dye was used for the  entirety of the case (120 cc of contrast total of the patient).   Retrograde aortic pressure was monitored in the case.  A 5 French short  straight endhole catheter was used to perform a pullback gradient across  the ostium of the right common iliac artery as well as the right external  iliac artery after administration of 200 mcg of intra-arterial  nitroglycerin   through a set up sheath.  There was no gradient identified.  Angiographic Data:   1: Abdominal aorta-fluoroscopically calcified, small in caliber but no  significant atherosclerotic changes 2: Left lower extremity-50% left common femoral artery stenosis, diffuse  60 to 70% segmental proximal and mid left SFA stenosis.  Tandem 95%  stenoses in the distal left SFA and proximal P1 segment.  Two-vessel  runoff via the anterior tibial and peroneal.  Peroneal had a 75% segmental  stenosis.  The anterior tibial was a dominant vessel to the foot and was  diffusely diseased 3: Right lower extremity-60 to 70% eccentric ostial right common iliac  artery stenosis.  There was no gradient noted across this.  50 to 60%  calcified right common femoral artery  stenosis.  60 to 70% segmental  proximal and mid right SFA stenosis.  95% focal mid right SFA stenosis, 60  to 70% segmental distal right SFA stenosis with 0 vessel runoff. Impression: Ms. Burford unfortunately has 0 vessel runoff below the knee  on the right which I suspect is the major contributor to her symptoms.   Her ostial eccentric right common iliac artery stenosis did not have a  pullback gradient after administration of intra-arterial nitroglycerin   suggesting that it was not physiologically significant and therefore  intervention was not performed.  She does have a fairly focal 95% mid  right SFA stenosis but I do not think this will significantly improve her  symptoms given her 0 vessel runoff.  Medical therapy will be recommended.   The sheath will be removed and pressure held.  Patient will be gently  hydrated although she is a dialysis patient and discharged home today  after 4 hours.  She can have dialysis on Saturday, her already scheduled  time.  She had 3 dialysis sessions already this week.  She left the lab in  stable condition.  I will see her back in the office in 2 to 3 weeks for  follow-up.  Lauro Portal. MD,  [facility] 02/03/2024 8:51 AM        Scheduled inpatient medications:   carvedilol   6.25 mg Oral BID   Chlorhexidine  Gluconate Cloth  6 each Topical Q0600   ferric citrate   210 mg Oral TID WC   furosemide   80 mg Oral Daily   insulin  aspart  0-5 Units Subcutaneous QHS   insulin  aspart  0-6 Units Subcutaneous TID WC   insulin  glargine-yfgn  12 Units Subcutaneous Daily   pantoprazole  (PROTONIX ) IV  40 mg Intravenous Q12H   Continuous inpatient infusions:   albumin human     PRN inpatient medications: acetaminophen  **OR** acetaminophen   Vital signs in last 24 hours: Temp:  [97.4 F (36.3 C)-98 F (36.7 C)] 97.9 F (36.6 C) (05/04 1102) Pulse Rate:  [60-90] 75 (05/04 1102) Resp:  [12-19] 18 (05/04 1102) BP: (101-141)/(37-95) 120/50 (05/04 1102) SpO2:  [93 %-100 %] 99 % (05/04 1102) Weight:  [71.8 kg-74.8 kg] 71.8 kg (05/04 1102) Last BM Date : 02/26/24  Intake/Output Summary (Last 24 hours) at 02/27/2024 1431 Last data filed at 02/27/2024 1255 Gross per 24 hour  Intake 420 ml  Output 3000 ml  Net -2580 ml    Intake/Output from previous day: 05/03 0701 - 05/04 0700 In: 580 [P.O.:180; I.V.:400] Out: -  Intake/Output this shift: Total I/O In: 240 [P.O.:240] Out: 3000 [Other:3000]   Physical Exam:  General: Alert female in NAD Heart:  Regular rate.  Pulmonary: Normal respiratory effort Abdomen: Soft, nondistended, nontender. Normal bowel sounds. Extremities: 1-2 + BLE edema  Neurologic: Alert and oriented Psych: Pleasant. Cooperative     LOS: 0 days   Mai Schwalbe ,NP 02/27/2024, 2:31 PM

## 2024-02-27 NOTE — Progress Notes (Signed)
 [facility] Progress Note   Subjective:   Seen on HD, tolerating well, no new concerns.   Objective Vitals:   02/27/24 1015 02/27/24 1045 02/27/24 1057 02/27/24 1102  BP: (!) 114/50 (!) 110/95 (!) 111/57 (!) 120/50  Pulse: 71 60 71 75  Resp: 16 13 16 18   Temp:      TempSrc:      SpO2: 100% 96% 99% 99%  Weight:       Physical Exam General: Alert female in NAD Heart: RRR, no murmurs, rubs or gallops Lungs: CTA bilaterally, respirations unlabored Abdomen: Soft, non-distended Extremities: 1+ edema b/l lower extremities Dialysis Access: AVG accessed  Additional Objective Labs: Basic Metabolic Panel: Recent Labs  Lab 02/25/24 1008 02/26/24 0544 02/27/24 0523  NA 134* 130* 129*  K 4.4 4.3 5.1  CL 94* 93* 91*  CO2 26 24 24   GLUCOSE 145* 76 183*  BUN 46* 56* 68*  CREATININE 3.97* 4.76* 6.18*  CALCIUM  9.0 8.8* 8.9  PHOS  --  3.6 4.6   Liver Function Tests: Recent Labs  Lab 02/25/24 1008 02/26/24 0544 02/27/24 0523  AST 32  --   --   ALT 26  --   --   ALKPHOS 65  --   --   BILITOT 1.1  --   --   PROT 5.1*  --   --   ALBUMIN 2.5* 2.5* 2.6*   No results for input(s): "LIPASE", "AMYLASE" in the last 168 hours. CBC: Recent Labs  Lab 02/25/24 1008 02/26/24 0029 02/26/24 0544 02/26/24 1715 02/27/24 0523  WBC 4.1  --  5.3 6.0 5.5  HGB 6.6*   < > 9.0* 9.9* 9.5*  HCT 21.4*   < > 27.2* 29.7* 28.9*  MCV 109.2*  --  101.5* 102.4* 102.5*  PLT 167  --  149* 178 159   < > = values in this interval not displayed.   Blood Culture    Component Value Date/Time   SDES BLOOD RIGHT HAND 03/16/2021 0046   SPECREQUEST  03/16/2021 0046    BOTTLES DRAWN AEROBIC ONLY Blood Culture results may not be optimal due to an inadequate volume of blood received in culture bottles   CULT  03/16/2021 0046    NO GROWTH 5 DAYS Performed at [facility], [address], [place], [place] [address]    REPTSTATUS 03/21/2021 FINAL 03/16/2021 0046    Cardiac  Enzymes: No results for input(s): "CKTOTAL", "CKMB", "CKMBINDEX", "TROPONINI" in the last 168 hours. CBG: Recent Labs  Lab 02/26/24 1059 02/26/24 1233 02/26/24 1414 02/26/24 1637 02/26/24 2027  GLUCAP 102* 118* 107* 195* 206*   Iron  Studies:  Recent Labs    02/25/24 1008  IRON  149  TIBC 253  FERRITIN 1,495*   @lablastinr3 @ Studies/Results: No results found. Medications:  albumin human     anticoagulant sodium citrate      carvedilol   6.25 mg Oral BID   Chlorhexidine  Gluconate Cloth  6 each Topical Q0600   ferric citrate   210 mg Oral TID WC   furosemide   80 mg Oral Daily   insulin  aspart  0-5 Units Subcutaneous QHS   insulin  aspart  0-6 Units Subcutaneous TID WC   insulin  glargine-yfgn  12 Units Subcutaneous Daily   pantoprazole  (PROTONIX ) IV  40 mg Intravenous Q12H    Dialysis Orders: [place] [facility] 3.5h   B400    70.6kg    LUA AVG   Heparin  none Last OP HD 5/01, post wt 70.9 Comes off  0-2kg over, bp's wnl on HD Mircera 200mcg IV q 2 weeks- last dose 02/24/24  Assessment/Plan: Acute on chronic anemia/ generalized weakness: FOBT+, Hb 6.6 on admission. S/p endoscopy but stopped early due to hypotension and bradycardia. Stabilized this AM.  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 HD today HTN: BP soft/stable.  Volume: Does have bilateral lower extremity edema. 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 ESA given 02/24/24. See above Secondary hyperparathyroidism: CCa in range, cont binders ac tid.  IDDM: per pmd  Ramona Burner, PA-C 02/27/2024, 11:31 AM  [facility] Pager: [phone]

## 2024-02-28 ENCOUNTER — Encounter (HOSPITAL_COMMUNITY)
Admission: EM | Disposition: A | Discharge status: Home Health | Payer: Self-pay | Source: Home / Self Care | Attending: Internal Medicine

## 2024-02-28 ENCOUNTER — Observation Stay (HOSPITAL_COMMUNITY): Admitting: Certified Registered Nurse Anesthetist

## 2024-02-28 ENCOUNTER — Telehealth: Payer: Self-pay | Admitting: Cardiology

## 2024-02-28 ENCOUNTER — Encounter (HOSPITAL_COMMUNITY): Payer: Self-pay | Admitting: Internal Medicine

## 2024-02-28 DIAGNOSIS — D649 Anemia, unspecified: Secondary | ICD-10-CM

## 2024-02-28 DIAGNOSIS — T182XXA Foreign body in stomach, initial encounter: Secondary | ICD-10-CM | POA: Diagnosis not present

## 2024-02-28 DIAGNOSIS — K31819 Angiodysplasia of stomach and duodenum without bleeding: Secondary | ICD-10-CM

## 2024-02-28 DIAGNOSIS — K921 Melena: Secondary | ICD-10-CM | POA: Diagnosis not present

## 2024-02-28 DIAGNOSIS — D62 Acute posthemorrhagic anemia: Secondary | ICD-10-CM

## 2024-02-28 HISTORY — PX: ENTEROSCOPY: SHX5533

## 2024-02-28 HISTORY — DX: Anemia, unspecified: D64.9

## 2024-02-28 LAB — RENAL FUNCTION PANEL
Albumin: 2.6 g/dL — ABNORMAL LOW (ref 3.5–5.0)
Anion gap: 14 (ref 5–15)
BUN: 62 mg/dL — ABNORMAL HIGH (ref 8–23)
CO2: 23 mmol/L (ref 22–32)
Calcium: 9 mg/dL (ref 8.9–10.3)
Chloride: 91 mmol/L — ABNORMAL LOW (ref 98–111)
Creatinine, Ser: 5.57 mg/dL — ABNORMAL HIGH (ref 0.44–1.00)
GFR, Estimated: 7 mL/min — ABNORMAL LOW (ref >60)
Glucose, Bld: 136 mg/dL — ABNORMAL HIGH (ref 70–99)
Phosphorus: 3.7 mg/dL (ref 2.5–4.6)
Potassium: 5.1 mmol/L (ref 3.5–5.1)
Sodium: 128 mmol/L — ABNORMAL LOW (ref 135–145)

## 2024-02-28 LAB — POCT I-STAT, CHEM 8
BUN: 54 mg/dL — ABNORMAL HIGH (ref 8–23)
Calcium, Ion: 1.14 mmol/L — ABNORMAL LOW (ref 1.15–1.40)
Chloride: 89 mmol/L — ABNORMAL LOW (ref 98–111)
Creatinine, Ser: 5.6 mg/dL — ABNORMAL HIGH (ref 0.44–1.00)
Glucose, Bld: 147 mg/dL — ABNORMAL HIGH (ref 70–99)
HCT: 29 % — ABNORMAL LOW (ref 36.0–46.0)
Hemoglobin: 9.9 g/dL — ABNORMAL LOW (ref 12.0–15.0)
Potassium: 4.4 mmol/L (ref 3.5–5.1)
Sodium: 127 mmol/L — ABNORMAL LOW (ref 135–145)
TCO2: 24 mmol/L (ref 22–32)

## 2024-02-28 LAB — GLUCOSE, CAPILLARY
Glucose-Capillary: 156 mg/dL — ABNORMAL HIGH (ref 70–99)
Glucose-Capillary: 151 mg/dL — ABNORMAL HIGH (ref 70–99)
Glucose-Capillary: 251 mg/dL — ABNORMAL HIGH (ref 70–99)
Glucose-Capillary: 234 mg/dL — ABNORMAL HIGH (ref 70–99)

## 2024-02-28 LAB — CBC
HCT: 27.7 % — ABNORMAL LOW (ref 36.0–46.0)
Hemoglobin: 8.7 g/dL — ABNORMAL LOW (ref 12.0–15.0)
MCH: 32.8 pg (ref 26.0–34.0)
MCHC: 31.4 g/dL (ref 30.0–36.0)
MCV: 104.5 fL — ABNORMAL HIGH (ref 80.0–100.0)
Platelets: 149 10*3/uL — ABNORMAL LOW (ref 150–400)
RBC: 2.65 MIL/uL — ABNORMAL LOW (ref 3.87–5.11)
RDW: 23.9 % — ABNORMAL HIGH (ref 11.5–15.5)
WBC: 7.2 10*3/uL (ref 4.0–10.5)
nRBC: 0 % (ref 0.0–0.2)

## 2024-02-28 SURGERY — ENTEROSCOPY
Anesthesia: Monitor Anesthesia Care

## 2024-02-28 MED ORDER — VASOPRESSIN 20 UNIT/ML IV SOLN
INTRAVENOUS | Status: DC | PRN
  Administered 2024-02-28: 1 [IU] via INTRAVENOUS

## 2024-02-28 MED ORDER — LIDOCAINE HCL (CARDIAC) PF 100 MG/5ML IV SOSY
PREFILLED_SYRINGE | INTRAVENOUS | Status: DC | PRN
  Administered 2024-02-28: 60 mg via INTRAVENOUS

## 2024-02-28 MED ORDER — SODIUM CHLORIDE 0.9 % IV SOLN
INTRAVENOUS | Status: DC | PRN
Start: 2024-02-28 — End: 2024-02-28

## 2024-02-28 MED ORDER — CARVEDILOL 6.25 MG PO TABS
6.2500 mg | ORAL_TABLET | Freq: Two times a day (BID) | ORAL | Status: DC
  Administered 2024-02-29 – 2024-03-01 (×2): 6.25 mg via ORAL
  Filled 2024-02-28 (×3): qty 1

## 2024-02-28 MED ORDER — ETOMIDATE 2 MG/ML IV SOLN
INTRAVENOUS | Status: DC | PRN
Start: 2024-02-28 — End: 2024-02-28
  Administered 2024-02-28: 2 mg via INTRAVENOUS

## 2024-02-28 MED ORDER — METOCLOPRAMIDE HCL 5 MG/ML IJ SOLN
5.0000 mg | Freq: Two times a day (BID) | INTRAMUSCULAR | Status: AC
  Administered 2024-02-29: 5 mg via INTRAVENOUS
  Filled 2024-02-28 (×2): qty 2

## 2024-02-28 MED ORDER — PROPOFOL 500 MG/50ML IV EMUL
INTRAVENOUS | Status: DC | PRN
  Administered 2024-02-28: 75 ug/kg/min via INTRAVENOUS

## 2024-02-28 NOTE — Plan of Care (Signed)

## 2024-02-28 NOTE — Anesthesia Preprocedure Evaluation (Addendum)
 Anesthesia Evaluation  Patient identified by MRN, date of birth, ID band Patient awake    Reviewed: Allergy & Precautions, NPO status , Patient's Chart, lab work & pertinent test results, reviewed documented beta blocker date and time   History of Anesthesia Complications Negative for: history of anesthetic complications  Airway Mallampati: III  TM Distance: >3 FB Neck ROM: Full    Dental  (+) Dental Advisory Given, Edentulous Upper, Chipped   Pulmonary COPD, former smoker   Pulmonary exam normal        Cardiovascular hypertension, Pt. on home beta blockers and Pt. on medications pulmonary hypertension+ Peripheral Vascular Disease  + dysrhythmias + Valvular Problems/Murmurs MR  Rhythm:Regular Rate:Normal + Systolic murmurs '25 Carotid US  - 40-59% bilateral ICAS  '25 TTE - EF 60 to 65%. There is moderately elevated pulmonary artery systolic pressure. Left atrial size was moderately dilated. Moderate to severe mitral valve regurgitation. Mild to moderate mitral stenosis. Tricuspid valve regurgitation is mild to moderate.     Neuro/Psych  PSYCHIATRIC DISORDERS  Depression     Neuromuscular disease    GI/Hepatic Neg liver ROS,GERD  Medicated and Controlled,,Anemia and dark stool   Endo/Other  diabetes, Type 2, Insulin  Dependent    Renal/GU ESRF and DialysisRenal disease     Musculoskeletal  (+) Arthritis ,    Abdominal   Peds  Hematology  (+) Blood dyscrasia, anemia   Anesthesia Other Findings   Reproductive/Obstetrics                              Anesthesia Physical Anesthesia Plan  ASA: 4  Anesthesia Plan: MAC   Post-op Pain Management: Minimal or no pain anticipated   Induction:   PONV Risk Score and Plan: 2 and Propofol  infusion and Treatment may vary due to age or medical condition  Airway Management Planned: Natural Airway and Nasal Cannula  Additional Equipment:  None  Intra-op Plan:   Post-operative Plan:   Informed Consent: I have reviewed the patients History and Physical, chart, labs and discussed the procedure including the risks, benefits and alternatives for the proposed anesthesia with the patient or authorized representative who has indicated his/her understanding and acceptance.       Plan Discussed with: CRNA and Anesthesiologist  Anesthesia Plan Comments:         Anesthesia Quick Evaluation

## 2024-02-28 NOTE — Progress Notes (Addendum)
 PT Cancellation Note  Patient Details Name: Anna Carr MRN: [id number] DOB: 1941/04/29   Cancelled Treatment:    Reason Eval/Treat Not Completed: Patient at procedure or test/unavailable. Pt currently off unit for Endoscopy. Will check back as schedule allows to continue with PT POC.    Venus Ginsberg 02/28/2024, 11:03 AM  Simone Dubois, PT, DPT Acute Rehabilitation Services Secure Chat Preferred Office: [phone]

## 2024-02-28 NOTE — Progress Notes (Addendum)
 HD#0 SUBJECTIVE:  Patient Summary: Anna Carr is a 83 y.o. with a pertinent PMH of ESRD on HD TThS, T2DM, nonrheumatic mitral valve regurgitation, HFpEF, advanced PVD, anemia of chronic disease, and hx of duodenal/jejunal angioectasias in 2022 who presented with weakness and melenic stool and admitted for concern for UGI bleed.   Overnight Events and Interim History: NEO. Seen in early afternoon as this morning had enteroscopy to search for jejunal/duodenal bleed, but no source ID.  Significant Hospital Events:  5/4 Enteroscopy aborted due to hypotension - beta blocker stopped 5/5 Enteroscopy completed without identified source of bleeding in upper GI system. Plan for capsule endoscopy.  OBJECTIVE:  Vital Signs: Vitals:   02/27/24 2302 02/28/24 0500 02/28/24 0548 02/28/24 0728  BP: (!) 119/58  (!) 127/55 135/86  Pulse: 87  82 75  Resp: 18  18 19   Temp: 98.7 F (37.1 C)  98.4 F (36.9 C) 97.7 F (36.5 C)  TempSrc: Oral  Oral Oral  SpO2: 99%  96% 98%  Weight:  72.3 kg     Supplemental O2: Room Air SpO2: 98 % O2 Flow Rate (L/min): 0 L/min   Filed Weights   02/27/24 0730 02/27/24 1102 02/28/24 0500  Weight: 74.8 kg 71.8 kg 72.3 kg     Intake/Output Summary (Last 24 hours) at 02/28/2024 0755 Last data filed at 02/28/2024 0550 Gross per 24 hour  Intake 660 ml  Output 3000 ml  Net -2340 ml   Net IO Since Admission: -354.05 mL [02/28/24 0755]  Physical Exam: Physical Exam Constitutional:      General: She is not in acute distress.    Appearance: She is not ill-appearing.  Cardiovascular:     Rate and Rhythm: Normal rate and regular rhythm.  Pulmonary:     Effort: Pulmonary effort is normal.     Breath sounds: Wheezing present.     Comments: Diffuse wheezes. No increased work of breathing. Abdominal:     General: Abdomen is flat.     Palpations: Abdomen is soft.  Skin:    General: Skin is warm and dry.  Neurological:     General: No focal deficit present.      Mental Status: She is alert and oriented to person, place, and time.  Psychiatric:        Mood and Affect: Mood normal.        Behavior: Behavior normal.     Patient Lines/Drains/Airways Status     Active Line/Drains/Airways     Name Placement date Placement time Site Days   Peripheral IV 02/25/24 20 G Anterior;Right Forearm 02/25/24  1225  Forearm  3   Peripheral IV 02/25/24 20 G Right Antecubital 02/25/24  1235  Antecubital  3   Fistula / Graft Left Upper arm Arteriovenous vein graft 03/12/21  1254  Upper arm  1083   Wound / Incision (Open or Dehisced) 02/26/24 Other (Comment) Knee Right;Left;Anterior bilateral knee abrasion 02/26/24  1900  Knee  2             ASSESSMENT/PLAN:  Assessment: Principal Problem:   Acute on chronic anemia Active Problems:   Angiodysplasia of small intestine  Anna Carr is a 83 y.o. with a pertinent PMH of ESRD on HD TThS, T2DM, nonrheumatic mitral valve regurgitation, HFpEF, advanced PVD, anemia of chronic disease, and hx of duodenal/jejunal angioectasias in 2022 who presented with weakness and melenic stool and admitted for concern for UGI bleed.   Plan: Acute on Chronic Anemia  Diverticulosis (  2022)  Hx of duodenal/jejunal angioectasias Hb remains stable around 9. She is s/p 2u PRBC this admission. Reports ongoing black stool that is typical for her, no gross blood or tarry stools. Endoscopy through proximal jejunum without identified source of bleeding, plan is for capsule endoscopy tomorrow. - GI consulted, appreciate recs - Protonix  40 mg BID - Clear liquid diet, NPO @ midnight   ESRD on HD TThS Continue HD per nephro, thank you. Monitor fluid balance.   HFpEF Last echo 12/2023 with 60-65 EF and normal function of the LV. Has moderate to severe MR. No aortic stenosis.  - Continue lasix  and resume coreg  near discharge - Daily weights, I&Os. Net - since admit.   T2DM - Continue glargine 12u daily + SSI  Best  Practice: Diet: NPO and then Renal diet after procedure IVF: none VTE: SCDs Start: 02/25/24 1331 Code: Full AB: None Pain Medicine: None Bowel Regimen: None, last BM 5/4 Therapy Recs: Home health PT/OT DISPO: Anticipated discharge in 1-2 days to Home pending  Endoscopy and subsequent needs .  Signature: Carleen Chary, D.O.  Internal Medicine Resident, PGY-1 [facility]  Pager: [phone] 7:55 AM, 02/28/2024   Please contact the on call pager after 5 pm and on weekends at [phone].

## 2024-02-28 NOTE — Telephone Encounter (Signed)
 Left message for the patient's son Autry Legions to call back.

## 2024-02-28 NOTE — Telephone Encounter (Signed)
 Son Anna Carr) called to report patient in hospital.

## 2024-02-28 NOTE — Op Note (Signed)
 [facility] Patient Name: Anna Carr Procedure Date : 02/28/2024 MRN: [id number] Attending MD: Harry Lindau , MD, [id number] Date of Birth: [birth year] CSN: [id number] Age: 82 Admit Type: Inpatient Procedure:                Small bowel enteroscopy Indications:              Acute post hemorrhagic anemia, hx of small bowel                            AVMs, Acute on chronic anemia Providers:                Harry Lindau, MD, Bradley Caffey, Adolfo Hooker, Technician Referring MD:              Medicines:                Monitored Anesthesia Care Complications:            No immediate complications. Estimated Blood Loss:     Estimated blood loss: none. Procedure:                Pre-Anesthesia Assessment:                           - Prior to the procedure, a History and Physical                            was performed, and patient medications and                            allergies were reviewed. The patient's tolerance of                            previous anesthesia was also reviewed. The risks                            and benefits of the procedure and the sedation                            options and risks were discussed with the patient.                            All questions were answered, and informed consent                            was obtained. Prior Anticoagulants: The patient has                            taken no anticoagulant or antiplatelet agents. ASA                            Grade Assessment: IV - A patient with severe  systemic disease that is a constant threat to life.                            After reviewing the risks and benefits, the patient                            was deemed in satisfactory condition to undergo the                            procedure.                           After obtaining informed consent, the endoscope was                            passed under  direct vision. Throughout the                            procedure, the patient's blood pressure, pulse, and                            oxygen saturations were monitored continuously. The                            PCF-190TL ([id number]) Olympus colonoscope was                            introduced through the mouth and advanced to the                            proximal jejunum. The small bowel enteroscopy was                            accomplished without difficulty. The patient                            tolerated the procedure well. Scope In: Scope Out: Findings:      The examined esophagus was normal.      A large amount of food (residue) was found in the gastric fundus, in the       gastric body and in the gastric antrum. This limited visualization of       the stomach. No blood noted.      There was no evidence of significant pathology in the entire examined       duodenum.      There was no evidence of significant pathology in the proximal jejunum. Impression:               - Normal esophagus.                           - A large amount of food (residue) in the stomach.                           - Normal examined duodenum.                           -  The examined portion of the jejunum was normal.                           - No specimens collected.                           - Able to advance into the proximal jejunum, with                            limited time spent in the small bowel due to                            retained gastric contents. No active bleeding,                            stigmata of recent bleeding, nor heme noted on this                            study. Do suspect that she has intermittent small                            bowel AVM bleeds given her history. Recommendation:           - Return patient to hospital ward for ongoing care.                           - Clear liquid diet today, NPO at midnight.                           - To visualize the small  bowel, perform video                            capsule endoscopy tomorrow.                           - Reglan 5 mg this evening and again in the AM                            prior to VCE placement. Procedure Code(s):        --- Professional ---                           [phone], Small intestinal endoscopy, enteroscopy                            beyond second portion of duodenum, not including                            ileum; diagnostic, including collection of                            specimen(s) by brushing or washing, when performed                            (  separate procedure) Diagnosis Code(s):        --- Professional ---                           D62, Acute posthemorrhagic anemia CPT copyright 2022 American Medical Association. All rights reserved. The codes documented in this report are preliminary and upon coder review may  be revised to meet current compliance requirements. Harry Lindau, MD 02/28/2024 11:02:51 AM Number of Addenda: 0

## 2024-02-28 NOTE — Progress Notes (Signed)
 [facility] Progress Note   Subjective:    Seen and examined patient at bedside. No acute concerns at this time. Appears EGD is scheduled for today. Next HD 5/6.  Objective Vitals:   02/28/24 0500 02/28/24 0548 02/28/24 0728 02/28/24 0936  BP:  (!) 127/55 135/86 131/63  Pulse:  82 75 77  Resp:  18 19 15   Temp:  98.4 F (36.9 C) 97.7 F (36.5 C)   TempSrc:  Oral Oral   SpO2:  96% 98% 98%  Weight: 72.3 kg      Physical Exam General: Alert female in NAD; on RA Heart: RRR, no murmurs, rubs or gallops Lungs: CTA bilaterally, respirations unlabored Abdomen: Soft, non-distended Extremities: trace edema b/l lower extremities Dialysis Access: AVG   Filed Weights   02/27/24 0730 02/27/24 1102 02/28/24 0500  Weight: 74.8 kg 71.8 kg 72.3 kg    Intake/Output Summary (Last 24 hours) at 02/28/2024 0950 Last data filed at 02/28/2024 0550 Gross per 24 hour  Intake 660 ml  Output 3000 ml  Net -2340 ml    Additional Objective Labs: Basic Metabolic Panel: Recent Labs  Lab 02/25/24 1008 02/26/24 0544 02/27/24 0523  NA 134* 130* 129*  K 4.4 4.3 5.1  CL 94* 93* 91*  CO2 26 24 24   GLUCOSE 145* 76 183*  BUN 46* 56* 68*  CREATININE 3.97* 4.76* 6.18*  CALCIUM  9.0 8.8* 8.9  PHOS  --  3.6 4.6   Liver Function Tests: Recent Labs  Lab 02/25/24 1008 02/26/24 0544 02/27/24 0523  AST 32  --   --   ALT 26  --   --   ALKPHOS 65  --   --   BILITOT 1.1  --   --   PROT 5.1*  --   --   ALBUMIN 2.5* 2.5* 2.6*   No results for input(s): "LIPASE", "AMYLASE" in the last 168 hours. CBC: Recent Labs  Lab 02/25/24 1008 02/26/24 0029 02/26/24 0544 02/26/24 1715 02/27/24 0523  WBC 4.1  --  5.3 6.0 5.5  HGB 6.6*   < > 9.0* 9.9* 9.5*  HCT 21.4*   < > 27.2* 29.7* 28.9*  MCV 109.2*  --  101.5* 102.4* 102.5*  PLT 167  --  149* 178 159   < > = values in this interval not displayed.   Blood Culture    Component Value Date/Time   SDES BLOOD RIGHT HAND 03/16/2021 0046    SPECREQUEST  03/16/2021 0046    BOTTLES DRAWN AEROBIC ONLY Blood Culture results may not be optimal due to an inadequate volume of blood received in culture bottles   CULT  03/16/2021 0046    NO GROWTH 5 DAYS Performed at [facility], [address], [place], [place] [address]    REPTSTATUS 03/21/2021 FINAL 03/16/2021 0046    Cardiac Enzymes: No results for input(s): "CKTOTAL", "CKMB", "CKMBINDEX", "TROPONINI" in the last 168 hours. CBG: Recent Labs  Lab 02/27/24 1234 02/27/24 1620 02/27/24 1909 02/27/24 2048 02/28/24 0732  GLUCAP 106* 273* 183* 162* 156*   Iron  Studies:  Recent Labs    02/25/24 1008  IRON  149  TIBC 253  FERRITIN 1,495*   Lab Results  Component Value Date   INR 1.0 03/29/2021   Studies/Results: No results found.  Medications:  [MAR Hold] albumin human      [MAR Hold] Chlorhexidine  Gluconate Cloth  6 each Topical Q0600   [MAR Hold] ferric citrate   210 mg Oral TID WC   [  MAR Hold] furosemide   80 mg Oral Daily   [MAR Hold] insulin  aspart  0-5 Units Subcutaneous QHS   [MAR Hold] insulin  aspart  0-6 Units Subcutaneous TID WC   [MAR Hold] insulin  glargine-yfgn  12 Units Subcutaneous Daily   [MAR Hold] pantoprazole  (PROTONIX ) IV  40 mg Intravenous Q12H    Dialysis Orders: [place] [facility] 3.5h   B400    70.6kg    LUA AVG   Heparin  none Last OP HD 5/01, post wt 70.9 Comes off 0-2kg over, bp's wnl on HD Mircera 200mcg IV q 2 weeks- last dose 02/24/24  Assessment/Plan: Acute on chronic anemia/ generalized weakness: FOBT+, Hb 6.6 on admission. S/p endoscopy but stopped early due to hypotension and bradycardia. Noted per GI note from 5/4, BB was held. Appears EGD is scheduled for today. 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 Next HD 5/6 per her usual schedule. HTN: BP soft/stable.  Volume: Does have trace bilateral lower extremity edema. UF with HD as BP allows. Anemia of esrd Hgb 9.5, s/p PRBC transfusion. ESA given 02/24/24. See above Secondary hyperparathyroidism: CCa in  range, cont binders ac tid.  IDDM: per pmd  Jadene Maxwell, NP [facility] 02/28/2024,9:50 AM  LOS: 0 days

## 2024-02-28 NOTE — Interval H&P Note (Signed)
 History and Physical Interval Note:  No acute events overnight.  H/H stable yesterday and she reports no overt bleeding yesterday or this morning.  Case discussed with Anesthesiologist at bedside.  Plan to proceed with enteroscopy today as scheduled for diagnostic and therapeutic intent.  02/28/2024 10:03 AM  Anna Carr  has presented today for surgery, with the diagnosis of Anemia and dark stool.  The various methods of treatment have been discussed with the patient and family. After consideration of risks, benefits and other options for treatment, the patient has consented to  Procedure(s): ENTEROSCOPY (N/A) as a surgical intervention.  The patient's history has been reviewed, patient examined, no change in status, stable for surgery.  I have reviewed the patient's chart and labs.  Questions were answered to the patient's satisfaction.     Anna Carr

## 2024-02-28 NOTE — Transfer of Care (Signed)
 Immediate Anesthesia Transfer of Care Note  Patient: Anna Carr  Procedure(s) Performed: ENTEROSCOPY  Patient Location: Endoscopy Unit  Anesthesia Type:MAC  Level of Consciousness: drowsy and patient cooperative  Airway & Oxygen Therapy: Patient Spontanous Breathing and Patient connected to face mask oxygen  Post-op Assessment: Report given to RN and Post -op Vital signs reviewed and stable  Post vital signs: Reviewed and stable  Last Vitals:  Vitals Value Taken Time  BP 104/52 02/28/24 1046  Temp    Pulse 69 02/28/24 1049  Resp 16 02/28/24 1049  SpO2 100 % 02/28/24 1049  Vitals shown include unfiled device data.  Last Pain:  Vitals:   02/28/24 1046  TempSrc:   PainSc: Asleep      Patients Stated Pain Goal: 0 (02/27/24 2302)  Complications: No notable events documented.

## 2024-02-28 NOTE — Anesthesia Postprocedure Evaluation (Signed)
 Anesthesia Post Note  Patient: Anna Carr  Procedure(s) Performed: ENTEROSCOPY     Patient location during evaluation: PACU Anesthesia Type: MAC Level of consciousness: awake and alert Pain management: pain level controlled Vital Signs Assessment: post-procedure vital signs reviewed and stable Respiratory status: spontaneous breathing, nonlabored ventilation, respiratory function stable and patient connected to nasal cannula oxygen Cardiovascular status: stable and blood pressure returned to baseline Postop Assessment: no apparent nausea or vomiting Anesthetic complications: no   No notable events documented.  Last Vitals:  Vitals:   02/28/24 1150 02/28/24 1300  BP: (!) 108/59 (!) 130/49  Pulse: 71 81  Resp: 18 18  Temp: (!) 36.4 C (!) 36.3 C  SpO2: 99%     Last Pain:  Vitals:   02/28/24 1300  TempSrc: Oral  PainSc:                  [name]

## 2024-02-29 ENCOUNTER — Encounter (HOSPITAL_COMMUNITY)
Admission: EM | Disposition: A | Discharge status: Home Health | Payer: Self-pay | Source: Home / Self Care | Attending: Internal Medicine

## 2024-02-29 DIAGNOSIS — Z992 Dependence on renal dialysis: Secondary | ICD-10-CM

## 2024-02-29 DIAGNOSIS — N186 End stage renal disease: Secondary | ICD-10-CM

## 2024-02-29 DIAGNOSIS — E871 Hypo-osmolality and hyponatremia: Secondary | ICD-10-CM | POA: Diagnosis not present

## 2024-02-29 DIAGNOSIS — D649 Anemia, unspecified: Secondary | ICD-10-CM | POA: Diagnosis not present

## 2024-02-29 HISTORY — PX: GIVENS CAPSULE STUDY: SHX5432

## 2024-02-29 LAB — CBC
HCT: 25.8 % — ABNORMAL LOW (ref 36.0–46.0)
Hemoglobin: 8.7 g/dL — ABNORMAL LOW (ref 12.0–15.0)
MCH: 34.5 pg — ABNORMAL HIGH (ref 26.0–34.0)
MCHC: 33.7 g/dL (ref 30.0–36.0)
MCV: 102.4 fL — ABNORMAL HIGH (ref 80.0–100.0)
Platelets: 149 10*3/uL — ABNORMAL LOW (ref 150–400)
RBC: 2.52 MIL/uL — ABNORMAL LOW (ref 3.87–5.11)
RDW: 22.9 % — ABNORMAL HIGH (ref 11.5–15.5)
WBC: 6.6 10*3/uL (ref 4.0–10.5)
nRBC: 0 % (ref 0.0–0.2)

## 2024-02-29 LAB — GLUCOSE, CAPILLARY
Glucose-Capillary: 64 mg/dL — ABNORMAL LOW (ref 70–99)
Glucose-Capillary: 141 mg/dL — ABNORMAL HIGH (ref 70–99)
Glucose-Capillary: 41 mg/dL — CL (ref 70–99)
Glucose-Capillary: 106 mg/dL — ABNORMAL HIGH (ref 70–99)
Glucose-Capillary: 215 mg/dL — ABNORMAL HIGH (ref 70–99)

## 2024-02-29 LAB — RENAL FUNCTION PANEL
Albumin: 2.5 g/dL — ABNORMAL LOW (ref 3.5–5.0)
Anion gap: 16 — ABNORMAL HIGH (ref 5–15)
BUN: 66 mg/dL — ABNORMAL HIGH (ref 8–23)
CO2: 21 mmol/L — ABNORMAL LOW (ref 22–32)
Calcium: 8.7 mg/dL — ABNORMAL LOW (ref 8.9–10.3)
Chloride: 86 mmol/L — ABNORMAL LOW (ref 98–111)
Creatinine, Ser: 5.71 mg/dL — ABNORMAL HIGH (ref 0.44–1.00)
GFR, Estimated: 7 mL/min — ABNORMAL LOW (ref >60)
Glucose, Bld: 66 mg/dL — ABNORMAL LOW (ref 70–99)
Phosphorus: 3.5 mg/dL (ref 2.5–4.6)
Potassium: 4.5 mmol/L (ref 3.5–5.1)
Sodium: 123 mmol/L — ABNORMAL LOW (ref 135–145)

## 2024-02-29 SURGERY — IMAGING PROCEDURE, GI TRACT, INTRALUMINAL, VIA CAPSULE

## 2024-02-29 MED ORDER — DEXTROSE 50 % IV SOLN
1.0000 | Freq: Once | INTRAVENOUS | Status: AC
  Administered 2024-02-29: 50 mL via INTRAVENOUS
  Filled 2024-02-29: qty 50

## 2024-02-29 MED ORDER — CHLORHEXIDINE GLUCONATE CLOTH 2 % EX PADS
6.0000 | MEDICATED_PAD | Freq: Every day | CUTANEOUS | Status: DC
  Administered 2024-02-29 – 2024-03-01 (×2): 6 via TOPICAL

## 2024-02-29 MED ORDER — ENSURE ENLIVE PO LIQD
237.0000 mL | Freq: Two times a day (BID) | ORAL | Status: DC
  Administered 2024-03-01 – 2024-03-08 (×3): 237 mL via ORAL

## 2024-02-29 MED ORDER — CHLORHEXIDINE GLUCONATE CLOTH 2 % EX PADS: 6.0000 | MEDICATED_PAD | Freq: Every day | CUTANEOUS | Status: DC

## 2024-02-29 MED ORDER — INSULIN GLARGINE-YFGN 100 UNIT/ML ~~LOC~~ SOLN
10.0000 [IU] | Freq: Every day | SUBCUTANEOUS | Status: DC
  Filled 2024-02-29: qty 0.1

## 2024-02-29 NOTE — Progress Notes (Addendum)
 HD#0 SUBJECTIVE:  Patient Summary: Anna Carr is a 83 y.o. with a pertinent PMH of ESRD on HD TThS, T2DM, nonrheumatic mitral valve regurgitation, HFpEF, advanced PVD, anemia of chronic disease, and hx of duodenal/jejunal angioectasias in 2022 who presented with weakness and melenic stool and admitted for concern for UGI bleed.   Overnight Events: NEO    Interm History: Patient reports feeling relatively the same as far as weakness. Has only gotten up to use the bathroom. Patient tried to have bowel movements but none occurred.   OBJECTIVE:  Vital Signs: Vitals:   02/29/24 0930 02/29/24 1000 02/29/24 1030 02/29/24 1100  BP: (!) 124/53 (!) 110/50 (!) 132/53 (!) 126/52  Pulse: 86 78 85 73  Resp: 17 15 17 11   Temp:      TempSrc:      SpO2: 100% 100% 100% 100%  Weight:      Height:       Supplemental O2: Room Air SpO2: 100 % O2 Flow Rate (L/min): 3 L/min  Filed Weights   02/28/24 0500 02/29/24 0500 02/29/24 0845  Weight: 72.3 kg 76.8 kg 76.4 kg     Intake/Output Summary (Last 24 hours) at 02/29/2024 1114 Last data filed at 02/29/2024 0000 Gross per 24 hour  Intake 970 ml  Output --  Net 970 ml   Net IO Since Admission: 690.95 mL [02/29/24 1114]  Physical Exam: Constitutional:      General: She is not in acute distress.    Appearance: She is not ill-appearing.  Cardiovascular:     Rate and Rhythm: Normal rate and regular rhythm.  Pulmonary:     Normal effort on room air.  Abdominal:     General: Abdomen is flat.     Palpations: Abdomen is soft.  Skin:    General: Skin is warm and dry.  Neurological:     General: No focal deficit present.     Mental Status: She is alert and oriented to person, place, and time.  Psychiatric:        Mood and Affect: Mood normal.        Behavior: Behavior normal.     Patient Lines/Drains/Airways Status     Active Line/Drains/Airways     Name Placement date Placement time Site Days   Peripheral IV 02/25/24 20 G  Anterior;Right Forearm 02/25/24  1225  Forearm  4   Peripheral IV 02/25/24 20 G Right Antecubital 02/25/24  1235  Antecubital  4   Fistula / Graft Left Upper arm Arteriovenous vein graft 03/12/21  1254  Upper arm  1084   Wound / Incision (Open or Dehisced) 02/26/24 Other (Comment) Knee Right;Left;Anterior bilateral knee abrasion 02/26/24  1900  Knee  3            Pertinent Labs:    Latest Ref Rng & Units 02/29/2024    4:59 AM 02/28/2024   12:24 PM 02/28/2024    9:54 AM  CBC  WBC 4.0 - 10.5 K/uL 6.6  7.2    Hemoglobin 12.0 - 15.0 g/dL 8.7  8.7  9.9   Hematocrit 36.0 - 46.0 % 25.8  27.7  29.0   Platelets 150 - 400 K/uL 149  149         Latest Ref Rng & Units 02/29/2024    4:59 AM 02/28/2024   12:24 PM 02/28/2024    9:54 AM  CMP  Glucose 70 - 99 mg/dL 66  161  096   BUN 8 - 23  mg/dL 66  62  54   Creatinine 0.44 - 1.00 mg/dL 7.82  9.56  2.13   Sodium 135 - 145 mmol/L 123  128  127   Potassium 3.5 - 5.1 mmol/L 4.5  5.1  4.4   Chloride 98 - 111 mmol/L 86  91  89   CO2 22 - 32 mmol/L 21  23    Calcium  8.9 - 10.3 mg/dL 8.7  9.0      Recent Labs    02/28/24 2044 02/29/24 0803 02/29/24 0857  GLUCAP 234* 64* 141*     Pertinent Imaging: No results found.  ASSESSMENT/PLAN:  Assessment: Principal Problem:   Acute on chronic anemia Active Problems:   Angiodysplasia of small intestine   Symptomatic anemia   Anna Carr is a 83 y.o. with a pertinent PMH of ESRD on HD TThS, T2DM, nonrheumatic mitral valve regurgitation, HFpEF, advanced PVD, anemia of chronic disease, and hx of duodenal/jejunal angioectasias in 2022 who presented with weakness and melenic stool and admitted for concern for UGI bleed on hospital day 4.  Plan: Acute on Chronic Anemia  Diverticulosis (2022)  Hx of duodenal/jejunal angioectasias Hb remains stable at 8.7. Reports ongoing black stool that is typical for her, no gross blood or tarry stools, but has not had any bowel movements since yesterday.  Patient feels relatively the same as far as weakness. Endoscopy through proximal jejunum without identified source of bleeding, plan is for capsule endoscopy later today. - GI consulted, appreciate recs - continue to monitor with CBC  - cancel NPO after capsule endoscopy    ESRD on HD TThS Continue HD per nephro, thank you. Monitor fluid balance and electrolytes.    Hypoglycemia T2DM Patient was started on insuline glargine earlier this morning. Earlier this morning, fasting glucose came back at 66, and fingerstick glucose later in the morning was 64. Patient was given amp of D50 and insuline glargine was lowered from 12 to 10 units. Suspect poor appetite and NPO status.  -continue to monitor glucose  - Continue glargine 10u daily + SSI   Hyponatremia Na has been steadily declining during her hospital course, potentially due to NPO and ESRD. After hemodialysis and cancelling NPO, Na should begin to be corrected.  -continue to monitor with metabolic panel    HFpEF Last echo 12/2023 with 60-65 EF and normal function of the LV. Has moderate to severe MR. No aortic stenosis.  - Continue lasix  and resume coreg  near discharge - Daily weights, I&Os. Net +691 since admit.   Best Practice: Diet: NPO and then Renal diet after procedure  IVF: none VTE: SCDs Start: 02/25/24 1331 Code: Full AB: None Therapy Recs: Home health PT/OT  Family Contact: No need to call.  DISPO: Anticipated discharge in 1-2 days to Home pending capsule endoscopy and subsequent needs.   Signature: Cody Das, Medical Student Please contact the on call pager after 5 pm and on weekends at [phone].   Attestation for Student Documentation:  I personally was present and re-performed the history, physical exam and medical decision-making activities of this service and have verified that the service and findings are accurately documented in the student's note.  Carleen Chary, DO 02/29/2024, 11:36 AM

## 2024-02-29 NOTE — Inpatient Diabetes Management (Signed)
 Inpatient Diabetes Program Recommendations  AACE/ADA: New Consensus Statement on Inpatient Glycemic Control (2015)  Target Ranges:  Prepandial:   less than 140 mg/dL      Peak postprandial:   less than 180 mg/dL (1-2 hours)      Critically ill patients:  140 - 180 mg/dL   Lab Results  Component Value Date   GLUCAP 141 (H) 02/29/2024   HGBA1C 5.6 02/25/2024    Review of Glycemic Control  Latest Reference Range & Units 02/28/24 20:44 02/29/24 08:03 02/29/24 08:57  Glucose-Capillary 70 - 99 mg/dL 045 (H) 64 (L) 409 (H)  (H): Data is abnormally high (L): Data is abnormally low Diabetes history: Type 2 DM Outpatient Diabetes medications: Soliqua  12 units QD Current orders for Inpatient glycemic control: Novolog  0-6 units TID & HS, Semglee  10 units QD  Inpatient Diabetes Program Recommendations:    Noted hypoglycemia and insulin  adjustments. Will follow.   Of note, A1C is 5.6% indicating risk for hypoglycemia.  May need to reduce Soliqua  at discharge.   Thanks, Marjo Sievert, MSN, RNC-OB Diabetes Coordinator [phone] (8a-5p)

## 2024-02-29 NOTE — Progress Notes (Signed)
 [facility] Progress Note   Subjective:    Seen and examined patient at bedside. No acute concerns at this time. Says no further BMs or bleeding noted.  Had BG this AM 63 in setting of NPO and rec'd dextrose .  Getting ready to start HD  Objective Vitals:   02/28/24 2046 02/29/24 0500 02/29/24 0546 02/29/24 0814  BP: 131/62  (!) 125/53 (!) 128/59  Pulse: 81  70 80  Resp: 16  16   Temp: 97.7 F (36.5 C)  98 F (36.7 C) 97.9 F (36.6 C)  TempSrc: Oral     SpO2: 94%  93% 97%  Weight:  76.8 kg    Height: 5\' 1"  (1.549 m)      Physical Exam General: Alert female in NAD; on RA Heart: RRR, no murmurs, rubs or gallops Lungs: CTA bilaterally, respirations unlabored Abdomen: Soft, non-distended Extremities: trace edema b/l lower extremities Dialysis Access: AVG +t/b  Filed Weights   02/27/24 1102 02/28/24 0500 02/29/24 0500  Weight: 71.8 kg 72.3 kg 76.8 kg    Intake/Output Summary (Last 24 hours) at 02/29/2024 0842 Last data filed at 02/29/2024 0000 Gross per 24 hour  Intake 1045 ml  Output --  Net 1045 ml    Additional Objective Labs: Basic Metabolic Panel: Recent Labs  Lab 02/27/24 0523 02/28/24 0954 02/28/24 1224 02/29/24 0459  NA 129* 127* 128* 123*  K 5.1 4.4 5.1 4.5  CL 91* 89* 91* 86*  CO2 24  --  23 21*  GLUCOSE 183* 147* 136* 66*  BUN 68* 54* 62* 66*  CREATININE 6.18* 5.60* 5.57* 5.71*  CALCIUM  8.9  --  9.0 8.7*  PHOS 4.6  --  3.7 3.5   Liver Function Tests: Recent Labs  Lab 02/25/24 1008 02/26/24 0544 02/27/24 0523 02/28/24 1224 02/29/24 0459  AST 32  --   --   --   --   ALT 26  --   --   --   --   ALKPHOS 65  --   --   --   --   BILITOT 1.1  --   --   --   --   PROT 5.1*  --   --   --   --   ALBUMIN 2.5*   < > 2.6* 2.6* 2.5*   < > = values in this interval not displayed.   No results for input(s): "LIPASE", "AMYLASE" in the last 168 hours. CBC: Recent Labs  Lab 02/26/24 0544 02/26/24 1715 02/27/24 0523 02/28/24 0954  02/28/24 1224 02/29/24 0459  WBC 5.3 6.0 5.5  --  7.2 6.6  HGB 9.0* 9.9* 9.5* 9.9* 8.7* 8.7*  HCT 27.2* 29.7* 28.9* 29.0* 27.7* 25.8*  MCV 101.5* 102.4* 102.5*  --  104.5* 102.4*  PLT 149* 178 159  --  149* 149*   Blood Culture    Component Value Date/Time   SDES BLOOD RIGHT HAND 03/16/2021 0046   SPECREQUEST  03/16/2021 0046    BOTTLES DRAWN AEROBIC ONLY Blood Culture results may not be optimal due to an inadequate volume of blood received in culture bottles   CULT  03/16/2021 0046    NO GROWTH 5 DAYS Performed at [facility], [address] [place], [place] [address]    REPTSTATUS 03/21/2021 FINAL 03/16/2021 0046    Cardiac Enzymes: No results for input(s): "CKTOTAL", "CKMB", "CKMBINDEX", "TROPONINI" in the last 168 hours. CBG: Recent Labs  Lab 02/28/24 0732 02/28/24 1144 02/28/24 1601 02/28/24 2044 02/29/24  0803  GLUCAP 156* 151* 251* 234* 64*   Iron  Studies:  No results for input(s): "IRON ", "TIBC", "TRANSFERRIN", "FERRITIN" in the last 72 hours.  Lab Results  Component Value Date   INR 1.0 03/29/2021   Studies/Results: No results found.  Medications:  albumin human      carvedilol   6.25 mg Oral BID   Chlorhexidine  Gluconate Cloth  6 each Topical Q0600   ferric citrate   210 mg Oral TID WC   furosemide   80 mg Oral Daily   insulin  aspart  0-5 Units Subcutaneous QHS   insulin  aspart  0-6 Units Subcutaneous TID WC   insulin  glargine-yfgn  10 Units Subcutaneous Daily   metoCLOPramide (REGLAN) injection  5 mg Intravenous Q12H   pantoprazole  (PROTONIX ) IV  40 mg Intravenous Q12H    Dialysis Orders: [facility] 3.5h   B400    70.6kg    LUA AVG   Heparin  none Last OP HD 5/01, post wt 70.9 Comes off 0-2kg over, bp's wnl on HD Mircera 200mcg IV q 2 weeks- last dose 02/24/24  Assessment/Plan: Acute on chronic anemia/ generalized weakness: FOBT+, Hb 6.6 on admission. S/p endoscopy but stopped early due to hypotension and bradycardia. Noted per GI note  from 5/4, BB was held. 5/5 EGD unrevealing.  For pill endoscopy today.  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 Next HD 5/6 per her usual schedule. HTN: BP soft/stable.  Volume: Does have trace bilateral lower extremity edema. UF with HD as BP allows. Anemia of esrd + ABLA:  s/p PRBC transfusion. ESA given 02/24/24. Trending H/H.  Secondary hyperparathyroidism: CCa in range, cont binders ac tid.  IDDM: per pmd  Adrian Alba MD [facility] Pager [phone]

## 2024-02-29 NOTE — Progress Notes (Signed)
   02/29/24 1215  Vitals  Temp 98.2 F (36.8 C)  Pulse Rate 69  Resp 14  BP (!) 132/55  SpO2 100 %  O2 Device Room Air  Weight 74.4 kg  Type of Weight Post-Dialysis  Post Treatment  Dialyzer Clearance Clear  Hemodialysis Intake (mL) 0 mL  Liters Processed 72  Fluid Removed (mL) 2500 mL  Tolerated HD Treatment Yes  AVG/AVF Arterial Site Held (minutes) 8 minutes  AVG/AVF Venous Site Held (minutes) 8 minutes   Received patient in bed to unit.  Alert and oriented.  Informed consent signed and in chart.   TX duration:3hrs  Patient tolerated well.  Transported back to the room  Alert, without acute distress.  Hand-off given to patient's nurse.   Access used: LUA AVG Access issues: none  Total UF removed: 2.5L Medication(s) given: none   Na'Shaminy T [facility]

## 2024-02-29 NOTE — Progress Notes (Signed)
 Physical Therapy Treatment Patient Details Name: Anna Carr MRN: [id number] DOB: 12-29-1940 Today's Date: 02/29/2024   History of Present Illness 83 yo female who presents 5/2 with a few days of worsening weakness. Acute on Chronic Anemia. Multiple falls. PMH: ESRD on HD TThS, T2DM, nonrheumatic mitral valve regurgitation, HFpEF, advanced PVD, anemia of chronic disease, and hx of duodenal/jejunal angioectasias in 2022.    PT Comments  Pt feeling fatigued from HD this AM and hungry and unable to have food for another 1.5 hrs after procedure, referred transfers and ambulation but agreeable to bed mobility and bed level exercises. Pt complete LE and abdominal exercises. Also practiced incentive spirometer use due to pt coughing  with changes in position. Pt initially could not pull 750, after several reps was able to achieve 1000. Continue to recommend HHPT at d/c.     If plan is discharge home, recommend the following: A little help with walking and/or transfers;A little help with bathing/dressing/bathroom;Assistance with cooking/housework;Assist for transportation;Help with stairs or ramp for entrance   Can travel by private vehicle        Equipment Recommendations  None recommended by PT    Recommendations for Other Services       Precautions / Restrictions Precautions Precautions: Fall Recall of Precautions/Restrictions: Intact Restrictions Weight Bearing Restrictions Per Provider Order: No     Mobility  Bed Mobility Overal bed mobility: Modified Independent             General bed mobility comments: extra time, worked on sitting up different ways and pt able to complete with use of rail    Transfers                   General transfer comment: pt deferred transfers and ambulation today due to fatigue from HD    Ambulation/Gait                   Stairs             Wheelchair Mobility     Tilt Bed    Modified Rankin (Stroke Patients  Only)       Balance                                            Communication Communication Communication: No apparent difficulties  Cognition Arousal: Alert Behavior During Therapy: WFL for tasks assessed/performed   PT - Cognitive impairments: No apparent impairments                         Following commands: Intact      Cueing Cueing Techniques: Verbal cues  Exercises General Exercises - Lower Extremity Ankle Circles/Pumps: AROM, Both, 10 reps, Supine Gluteal Sets: Strengthening, Both, 10 reps, Supine Heel Slides: AROM, Both, 10 reps, Supine Straight Leg Raises: AROM, Both, 10 reps, Supine Other Exercises Other Exercises: bridges x10 Other Exercises: abdominal activation exercises    General Comments General comments (skin integrity, edema, etc.): practiced IS, pt initially could not pull 750 but acheived 1000 after a few practices.      Pertinent Vitals/Pain Pain Assessment Pain Assessment: No/denies pain    Home Living                          Prior Function  PT Goals (current goals can now be found in the care plan section) Acute Rehab PT Goals Patient Stated Goal: Get well go home PT Goal Formulation: With patient/family Time For Goal Achievement: 03/10/24 Potential to Achieve Goals: Good Progress towards PT goals: Progressing toward goals    Frequency    Min 2X/week      PT Plan      Co-evaluation              AM-PAC PT "6 Clicks" Mobility   Outcome Measure  Help needed turning from your back to your side while in a flat bed without using bedrails?: None Help needed moving from lying on your back to sitting on the side of a flat bed without using bedrails?: None Help needed moving to and from a bed to a chair (including a wheelchair)?: A Little Help needed standing up from a chair using your arms (e.g., wheelchair or bedside chair)?: A Little Help needed to walk in hospital room?: A  Little Help needed climbing 3-5 steps with a railing? : A Little 6 Click Score: 20    End of Session   Activity Tolerance: Patient limited by fatigue Patient left: in bed;with call bell/phone within reach;with bed alarm set Nurse Communication: Mobility status PT Visit Diagnosis: Unsteadiness on feet (R26.81);Other abnormalities of gait and mobility (R26.89);Repeated falls (R29.6);Muscle weakness (generalized) (M62.81);History of falling (Z91.81);Difficulty in walking, not elsewhere classified (R26.2);Dizziness and giddiness (R42)     Time: 6295-2841 PT Time Calculation (min) (ACUTE ONLY): 20 min  Charges:    $Therapeutic Exercise: 8-22 mins PT General Charges $$ ACUTE PT VISIT: 1 Visit                     Amey Ka, PT  Acute Rehab Services Secure chat preferred Office [phone]    Deloris Fetters Aubrie Lucien 02/29/2024, 4:31 PM

## 2024-02-29 NOTE — Plan of Care (Signed)

## 2024-02-29 NOTE — Progress Notes (Signed)
 OT Cancellation Note  Patient Details Name: Anna Carr MRN: [id number] DOB: [birth year]   Cancelled Treatment:    Reason Eval/Treat Not Completed: (P) Patient at procedure or test/ unavailable, will return as able  [name] 02/29/2024, 11:27 AM

## 2024-02-29 NOTE — Progress Notes (Incomplete)
 I have seen the patient and reviewed the daily progress note by *** MS *** and discussed the care of the patient with them.  See below for documentation of my findings, assessment, and plans.  Subjective: Patient Summary: Anna Carr is a 83 y.o. with a pertinent PMH of ESRD on HD TThS, T2DM, nonrheumatic mitral valve regurgitation, HFpEF, advanced PVD, anemia of chronic disease, and hx of duodenal/jejunal angioectasias in 2022 who presented with weakness and melenic stool and admitted for concern for UGI bleed.  Overnight Events and Interim History: NEO  Significant Hospital Events:   5/4 Enteroscopy aborted due to hypotension - beta blocker stopped 5/5 Enteroscopy completed without identified source of bleeding in upper GI system. Plan for capsule endoscopy. 5/6 Hemodialysis completed   Objective: Vital signs in last 24 hours: Vitals:   02/29/24 0930 02/29/24 1000 02/29/24 1030 02/29/24 1100  BP: (!) 124/53 (!) 110/50 (!) 132/53 (!) 126/52  Pulse: 86 78 85 73  Resp: 17 15 17 11   Temp:      TempSrc:      SpO2: 100% 100% 100% 100%  Weight:      Height:       Weight change: 2 kg  Intake/Output Summary (Last 24 hours) at 02/29/2024 1109 Last data filed at 02/29/2024 0000 Gross per 24 hour  Intake 970 ml  Output --  Net 970 ml   BP (!) 126/52   Pulse 73   Temp 97.8 F (36.6 C)   Resp 11   Ht 5\' 1"  (1.549 m)   Wt 76.4 kg   SpO2 100%   BMI 31.82 kg/m   Physical Exam: Physical Exam Constitutional:      General: She is not in acute distress.    Appearance: She is not ill-appearing.  Cardiovascular:     Rate and Rhythm: Normal rate and regular rhythm.  Pulmonary:     Normal effort on room air.  Abdominal:     General: Abdomen is flat.     Palpations: Abdomen is soft.  Skin:    General: Skin is warm and dry.  Neurological:     General: No focal deficit present.     Mental Status: She is alert and oriented to person, place, and time.  Psychiatric:         Mood and Affect: Mood normal.        Behavior: Behavior normal.    Lab Results: .@REVFS (226:3)@ Micro Results: Reviewed and documented in Electronic Record Studies/Results: Reviewed and documented in Electronic Record Medications: I have reviewed the patient's current medications.  [facility]S Scheduled Meds:  carvedilol   6.25 mg Oral BID   Chlorhexidine  Gluconate Cloth  6 each Topical Q0600   ferric citrate   210 mg Oral TID WC   furosemide   80 mg Oral Daily   insulin  aspart  0-5 Units Subcutaneous QHS   insulin  aspart  0-6 Units Subcutaneous TID WC   insulin  glargine-yfgn  10 Units Subcutaneous Daily   metoCLOPramide (REGLAN) injection  5 mg Intravenous Q12H   pantoprazole  (PROTONIX ) IV  40 mg Intravenous Q12H   Continuous Infusions:  albumin human     PRN Meds:.acetaminophen  **OR** acetaminophen  Assessment/Plan: Acute on Chronic Anemia  Diverticulosis (2022)  Hx of duodenal/jejunal angioectasias Hb remains stable at 8.7. Reports ongoing black stool that is typical for her, no gross blood or tarry stools, but has not had any bowel movements since yesterday. Patient feels relatively the same as far as weakness. Endoscopy through proximal jejunum without  identified source of bleeding, plan is for capsule endoscopy later today. - GI consulted, appreciate recs - continue to monitor with CBC  - cancel NPO after capsule endoscopy    ESRD on HD TThS Continue HD per nephro, thank you. Monitor fluid balance and electrolytes.   Hypoglycemia Patient was started on insuline glargine earlier this morning. Earlier this morning, fasting glucose came back at 66, and fingerstick glucose later in the morning was 64. Patient was given amp of D50 and insuline glargine was lowered from 12 to 10 units.  -continue to monitor glucose   Hyponatremia Na has been steadily declining during her hospital course, potentially due to NPO and ESRD. After hemodialysis and cancelling NPO, Na should begin to be  corrected.  -continue to monitor with metabolic panel    HFpEF Last echo 12/2023 with 60-65 EF and normal function of the LV. Has moderate to severe MR. No aortic stenosis.  - Continue lasix  and resume coreg  near discharge - Daily weights, I&Os. Net - since admit.   T2DM - Continue glargine 10u daily + SSI  Dispo: Disposition is deferred at this time, awaiting improvement of current medical problems.  Anticipated discharge in approximately *** day(s).   The patient {DOES_DOES ZOX:09604} have a current PCP Eluterio Hamburg, Murvin Arthurs., MD) and {DOES_DOES VWU:98119} need an [facility] hospital follow-up appointment after discharge.  The patient {DOES_DOES JYN:[id number]} have transportation limitations that hinder transportation to clinic appointments.  .Services Needed at time of discharge: Y = Yes, Blank = No PT:   OT:   RN:   Equipment:   Other:     LOS: 0 days   [name], Medical Student 02/29/2024, 11:09 AM

## 2024-02-29 NOTE — Progress Notes (Signed)
 Pt receives out-pt HD at [facility] on TTS 10:45 am chair time. Will assist as needed.   Lauraine Polite Renal Navigator [phone]

## 2024-03-01 ENCOUNTER — Ambulatory Visit: Admitting: Cardiology

## 2024-03-01 ENCOUNTER — Encounter (HOSPITAL_COMMUNITY): Payer: Self-pay | Admitting: Gastroenterology

## 2024-03-01 DIAGNOSIS — R5381 Other malaise: Secondary | ICD-10-CM | POA: Diagnosis not present

## 2024-03-01 DIAGNOSIS — N186 End stage renal disease: Secondary | ICD-10-CM | POA: Diagnosis present

## 2024-03-01 DIAGNOSIS — I959 Hypotension, unspecified: Secondary | ICD-10-CM | POA: Diagnosis not present

## 2024-03-01 DIAGNOSIS — Z952 Presence of prosthetic heart valve: Secondary | ICD-10-CM | POA: Diagnosis not present

## 2024-03-01 DIAGNOSIS — E1122 Type 2 diabetes mellitus with diabetic chronic kidney disease: Secondary | ICD-10-CM

## 2024-03-01 DIAGNOSIS — E1151 Type 2 diabetes mellitus with diabetic peripheral angiopathy without gangrene: Secondary | ICD-10-CM | POA: Diagnosis present

## 2024-03-01 DIAGNOSIS — D62 Acute posthemorrhagic anemia: Secondary | ICD-10-CM | POA: Diagnosis not present

## 2024-03-01 DIAGNOSIS — E871 Hypo-osmolality and hyponatremia: Secondary | ICD-10-CM | POA: Diagnosis present

## 2024-03-01 DIAGNOSIS — Z794 Long term (current) use of insulin: Secondary | ICD-10-CM

## 2024-03-01 DIAGNOSIS — D631 Anemia in chronic kidney disease: Secondary | ICD-10-CM | POA: Diagnosis present

## 2024-03-01 DIAGNOSIS — K31819 Angiodysplasia of stomach and duodenum without bleeding: Secondary | ICD-10-CM | POA: Diagnosis not present

## 2024-03-01 DIAGNOSIS — I97711 Intraoperative cardiac arrest during other surgery: Secondary | ICD-10-CM | POA: Diagnosis not present

## 2024-03-01 DIAGNOSIS — J9600 Acute respiratory failure, unspecified whether with hypoxia or hypercapnia: Secondary | ICD-10-CM | POA: Diagnosis not present

## 2024-03-01 DIAGNOSIS — D649 Anemia, unspecified: Secondary | ICD-10-CM | POA: Diagnosis present

## 2024-03-01 DIAGNOSIS — N2581 Secondary hyperparathyroidism of renal origin: Secondary | ICD-10-CM | POA: Diagnosis present

## 2024-03-01 DIAGNOSIS — J449 Chronic obstructive pulmonary disease, unspecified: Secondary | ICD-10-CM | POA: Diagnosis present

## 2024-03-01 DIAGNOSIS — I5032 Chronic diastolic (congestive) heart failure: Secondary | ICD-10-CM | POA: Diagnosis present

## 2024-03-01 DIAGNOSIS — Z992 Dependence on renal dialysis: Secondary | ICD-10-CM | POA: Diagnosis not present

## 2024-03-01 DIAGNOSIS — M25512 Pain in left shoulder: Secondary | ICD-10-CM | POA: Diagnosis not present

## 2024-03-01 DIAGNOSIS — I132 Hypertensive heart and chronic kidney disease with heart failure and with stage 5 chronic kidney disease, or end stage renal disease: Secondary | ICD-10-CM | POA: Diagnosis present

## 2024-03-01 DIAGNOSIS — I469 Cardiac arrest, cause unspecified: Secondary | ICD-10-CM | POA: Diagnosis not present

## 2024-03-01 DIAGNOSIS — M96A3 Multiple fractures of ribs associated with chest compression and cardiopulmonary resuscitation: Secondary | ICD-10-CM | POA: Diagnosis not present

## 2024-03-01 DIAGNOSIS — K922 Gastrointestinal hemorrhage, unspecified: Secondary | ICD-10-CM | POA: Diagnosis not present

## 2024-03-01 DIAGNOSIS — K31811 Angiodysplasia of stomach and duodenum with bleeding: Secondary | ICD-10-CM | POA: Diagnosis present

## 2024-03-01 DIAGNOSIS — Z66 Do not resuscitate: Secondary | ICD-10-CM | POA: Diagnosis present

## 2024-03-01 DIAGNOSIS — N179 Acute kidney failure, unspecified: Secondary | ICD-10-CM | POA: Diagnosis not present

## 2024-03-01 DIAGNOSIS — I5021 Acute systolic (congestive) heart failure: Secondary | ICD-10-CM | POA: Diagnosis not present

## 2024-03-01 DIAGNOSIS — D696 Thrombocytopenia, unspecified: Secondary | ICD-10-CM | POA: Diagnosis not present

## 2024-03-01 DIAGNOSIS — S46002D Unspecified injury of muscle(s) and tendon(s) of the rotator cuff of left shoulder, subsequent encounter: Secondary | ICD-10-CM | POA: Diagnosis not present

## 2024-03-01 DIAGNOSIS — I120 Hypertensive chronic kidney disease with stage 5 chronic kidney disease or end stage renal disease: Secondary | ICD-10-CM | POA: Diagnosis not present

## 2024-03-01 DIAGNOSIS — J9811 Atelectasis: Secondary | ICD-10-CM | POA: Diagnosis not present

## 2024-03-01 LAB — GLUCOSE, CAPILLARY
Glucose-Capillary: 213 mg/dL — ABNORMAL HIGH (ref 70–99)
Glucose-Capillary: 251 mg/dL — ABNORMAL HIGH (ref 70–99)
Glucose-Capillary: 193 mg/dL — ABNORMAL HIGH (ref 70–99)
Glucose-Capillary: 189 mg/dL — ABNORMAL HIGH (ref 70–99)

## 2024-03-01 LAB — CBC
HCT: 27.4 % — ABNORMAL LOW (ref 36.0–46.0)
Hemoglobin: 8.8 g/dL — ABNORMAL LOW (ref 12.0–15.0)
MCH: 33.6 pg (ref 26.0–34.0)
MCHC: 32.1 g/dL (ref 30.0–36.0)
MCV: 104.6 fL — ABNORMAL HIGH (ref 80.0–100.0)
Platelets: 157 10*3/uL (ref 150–400)
RBC: 2.62 MIL/uL — ABNORMAL LOW (ref 3.87–5.11)
RDW: 23.6 % — ABNORMAL HIGH (ref 11.5–15.5)
WBC: 6.2 10*3/uL (ref 4.0–10.5)
nRBC: 0 % (ref 0.0–0.2)

## 2024-03-01 LAB — RENAL FUNCTION PANEL
Albumin: 2.6 g/dL — ABNORMAL LOW (ref 3.5–5.0)
Anion gap: 12 (ref 5–15)
BUN: 37 mg/dL — ABNORMAL HIGH (ref 8–23)
CO2: 25 mmol/L (ref 22–32)
Calcium: 8.8 mg/dL — ABNORMAL LOW (ref 8.9–10.3)
Chloride: 87 mmol/L — ABNORMAL LOW (ref 98–111)
Creatinine, Ser: 4.36 mg/dL — ABNORMAL HIGH (ref 0.44–1.00)
GFR, Estimated: 10 mL/min — ABNORMAL LOW (ref >60)
Glucose, Bld: 172 mg/dL — ABNORMAL HIGH (ref 70–99)
Phosphorus: 3.4 mg/dL (ref 2.5–4.6)
Potassium: 4.2 mmol/L (ref 3.5–5.1)
Sodium: 124 mmol/L — ABNORMAL LOW (ref 135–145)

## 2024-03-01 MED ORDER — CHLORHEXIDINE GLUCONATE CLOTH 2 % EX PADS
6.0000 | MEDICATED_PAD | Freq: Every day | CUTANEOUS | Status: DC
  Administered 2024-03-01 – 2024-03-06 (×5): 6 via TOPICAL

## 2024-03-01 MED ORDER — METOCLOPRAMIDE HCL 5 MG/ML IJ SOLN
5.0000 mg | Freq: Three times a day (TID) | INTRAMUSCULAR | Status: AC
  Administered 2024-03-01 – 2024-03-02 (×2): 5 mg via INTRAVENOUS
  Filled 2024-03-01 (×2): qty 2

## 2024-03-01 MED ORDER — STERILE WATER FOR INJECTION IJ SOLN
INTRAMUSCULAR | Status: AC
  Administered 2024-03-01: 10 mL
  Filled 2024-03-01: qty 10

## 2024-03-01 MED ORDER — POLYETHYLENE GLYCOL 3350 17 G PO PACK
17.0000 g | PACK | Freq: Every day | ORAL | Status: DC
  Administered 2024-03-01: 17 g via ORAL
  Filled 2024-03-01: qty 1

## 2024-03-01 MED ORDER — POLYETHYLENE GLYCOL 3350 17 G PO PACK: 17.0000 g | PACK | Freq: Every day | ORAL | Status: DC | PRN

## 2024-03-01 NOTE — Procedures (Signed)
 Images from the Video Capsule Endoscopy reviewed and notable for the following:  Findings: 1) Complete capsule endoscopy with adequate prep 2) First duodenal image at 30 minutes 3) Streak of heme noted at 32-minute mark, which is 2 minutes beyond first duodenal image 4) There is more active oozing starting at 1 hour 19 minutes without visible source, and additional blood in lumen through 1 hour 57 minutes 5) Dark melenic effluent just prior to cecum, likely pooling of blood and not actively bleeding  Summary and recommendations: Complete capsule study with adequate for use of the small bowel.  Active oozing approximately 45 minutes into the small bowel, then more active bleeding approximately 6 5 minutes into the small bowel.  These are likely in the mid small bowel and potentially due to small bowel AVMs that are not readily visible on this study.  I discussed these findings with the patient and her son at bedside.  She is certainly at elevated risk for repeat endoscopic procedures given her recent bradycardia during enteroscopy less than a week ago.  She otherwise did fine with enteroscopy earlier this week, but exam was limited due to retained food in the stomach.  Given the active bleeding and need for blood transfusion on this admission, we mutually agreed to repeat push enteroscopy using SIF scope tomorrow for diagnostic and potentially therapeutic intent.  - N.p.o. at midnight - Will give Reglan this evening and early tomorrow morning to ensure no retained gastric contents - Serial CBC checks with blood products as needed per protocol - I discussed the risks, benefits, alternatives of small bowel enteroscopy with the patient and her son at length today, and they agree and wish to proceed   Harry Lindau, DO, [facility]

## 2024-03-01 NOTE — Progress Notes (Signed)
 Pt at bedrest. GI monitor removed and placed in a belongings bag--left at pt's bedside.

## 2024-03-01 NOTE — Progress Notes (Signed)
 HD#0 SUBJECTIVE:  Patient Summary: Anna Carr is a 83 y.o. with a pertinent PMH of ESRD on HD TThS, T2DM, nonrheumatic mitral valve regurgitation, HFpEF, advanced PVD, anemia of chronic disease, and hx of duodenal/jejunal angioectasias in 2022 who presented with weakness and melenic stool and admitted for concern for UGI bleed.   Overnight Events: Patient had video capsule endoscopy done yesterday, still awaiting results. Hemodialysis was also preformed yesterday for her ESRD. Was hypoglycemic yesterday and was given a total of 2 amps of D50. Patient also worked with PT and did bed level exercises with no complications.    Interm History: Patient complained of constipation with only one small bowel movement since yesterday. Patient reports that the bowel movement was its normal black color with no new color or blood. Patient denies abdominal pain, just feels "full". Anna Carr was able to eat breakfast this morning with no trouble. Was not able to sleep last night and stated that Anna Carr has trouble sleeping occasionally.   OBJECTIVE:  Vital Signs: Vitals:   02/29/24 1943 02/29/24 2120 03/01/24 0343 03/01/24 0600  BP: (!) 102/50  (!) 120/48   Pulse: 93 84 71   Resp: 16  16   Temp: 98.9 F (37.2 C)  98.7 F (37.1 C)   TempSrc: Oral  Oral   SpO2: 98% 100% 100%   Weight:    72.8 kg  Height:       Supplemental O2: Room Air SpO2: 100 % O2 Flow Rate (L/min): 3 L/min  Filed Weights   02/29/24 0845 02/29/24 1215 03/01/24 0600  Weight: 76.4 kg 74.4 kg 72.8 kg     Intake/Output Summary (Last 24 hours) at 03/01/2024 0854 Last data filed at 02/29/2024 1300 Gross per 24 hour  Intake --  Output 2600 ml  Net -2600 ml   Net IO Since Admission: -1,909.05 mL [03/01/24 0854]  Physical Exam: Constitutional:      General: Anna Carr is not in acute distress.    Appearance: Anna Carr is not ill-appearing.  Cardiovascular:     Rate and Rhythm: Normal rate and regular rhythm.  Pulmonary:     Normal effort  on room air.  Abdominal:     General: Abdomen is flat.     Palpations: Abdomen is soft.  Skin:    General: Skin is warm and dry.  Neurological:     General: No focal deficit present.     Mental Status: Anna Carr is alert and oriented to person, place, and time.  Psychiatric:        Mood and Affect: Mood normal.        Behavior: Behavior normal.     Patient Lines/Drains/Airways Status     Active Line/Drains/Airways     Name Placement date Placement time Site Days   Peripheral IV 02/25/24 20 G Anterior;Right Forearm 02/25/24  1225  Forearm  5   Peripheral IV 02/25/24 20 G Right Antecubital 02/25/24  1235  Antecubital  5   Fistula / Graft Left Upper arm Arteriovenous vein graft 03/12/21  1254  Upper arm  1085   Wound / Incision (Open or Dehisced) 02/26/24 Other (Comment) Knee Right;Left;Anterior bilateral knee abrasion 02/26/24  1900  Knee  4            Pertinent Labs:    Latest Ref Rng & Units 03/01/2024    7:31 AM 02/29/2024    4:59 AM 02/28/2024   12:24 PM  CBC  WBC 4.0 - 10.5 K/uL 6.2  6.6  7.2   Hemoglobin 12.0 - 15.0 g/dL 8.8  8.7  8.7   Hematocrit 36.0 - 46.0 % 27.4  25.8  27.7   Platelets 150 - 400 K/uL 157  149  149        Latest Ref Rng & Units 03/01/2024    7:31 AM 02/29/2024    4:59 AM 02/28/2024   12:24 PM  CMP  Glucose 70 - 99 mg/dL 161  66  096   BUN 8 - 23 mg/dL 37  66  62   Creatinine 0.44 - 1.00 mg/dL 0.45  4.09  8.11   Sodium 135 - 145 mmol/L 124  123  128   Potassium 3.5 - 5.1 mmol/L 4.2  4.5  5.1   Chloride 98 - 111 mmol/L 87  86  91   CO2 22 - 32 mmol/L 25  21  23    Calcium  8.9 - 10.3 mg/dL 8.8  8.7  9.0     Recent Labs    02/29/24 1458 02/29/24 2100 03/01/24 0800  GLUCAP 106* 215* 213*     Pertinent Imaging: No results found.  ASSESSMENT/PLAN:  Assessment: Principal Problem:   Acute on chronic anemia Active Problems:   ESRD on dialysis ([name])   Angiodysplasia of small intestine   Symptomatic anemia   HAYVIN PETREY is a 83 y.o. with  a pertinent PMH of ESRD on HD TThS, T2DM, nonrheumatic mitral valve regurgitation, HFpEF, advanced PVD, anemia of chronic disease, and hx of duodenal/jejunal angioectasias in 2022 who presented with weakness and melenic stool and admitted for concern for UGI bleed on hospital day 5.  Plan: Acute on Chronic Anemia  Diverticulosis (2022)  Hx of duodenal/jejunal angioectasias Hb remains stable at 8.8. Reports ongoing black stool that is typical for her, but reported feeling constipated with only one small bowel movement since yesterday. Patient was able to work with PT yesterday without any complication. Awaiting results from capsule endoscopy that was performed yesterday.  - GI consulted, appreciate recs - continue to monitor with CBC  - Regular diet - continue to work with PT/OT for strength and endurance  Constipation Patient reported feeling constipated and only had one small bowel movement since yesterday. -polyethylene glycol (MIRALAX  / GLYCOLAX ) packet 17 g      ESRD on HD TThS Received HD yesterday. Monitor fluid balance and electrolytes.    T2DM Patient was hypoglycemic yesterday and was given 2 amps of D50. Patients most recent blood glucose was elevated at 216, and most recent fingerstick glucose was 213 after eating   -continue to monitor glucose, continue SSI -A1c below 6, on 12 semglee  PTH, likely does not need maintenance insulin  -increase insulin  aspart prior to meals if needed    Hyponatremia Na is decreased but stable from yesterday. Potentially due to recently being NPO and ESRD.  -continue to monitor with metabolic panel  -feeding supplement (ENSURE ENLIVE / ENSURE PLUS) liquid 237 mL 2 times a day between meals    HFpEF Stable, not in exacerbation. Last echo 12/2023 with 60-65 EF and normal function of the LV. Has moderate to severe MR. No aortic stenosis.  - Continue lasix  and resume coreg  near discharge - Daily weights, I&Os. Net -1909 since admit due to HD    Best Practice: Diet: Renal diet IVF: none VTE: SCDs Start: 02/25/24 1331 Code: Full AB: None Therapy Recs: Home health PT/OT Family Contact: No need to call.  DISPO: Anticipated discharge today (5/7) or in 1 day to Home pending  endoscopy and subsequent needs.  Signature: Cody Das, Medical Student   Please contact the on call pager after 5 pm and on weekends at [phone].   Attestation for Student Documentation:  I personally was present and re-performed the history, physical exam and medical decision-making activities of this service and have verified that the service and findings are accurately documented in the student's note.  Carleen Chary, DO 03/01/2024, 9:42 AM

## 2024-03-01 NOTE — Progress Notes (Signed)
 Occupational Therapy Treatment Patient Details Name: Anna Carr MRN: [id number] DOB: 1941-04-19 Today's Date: 03/01/2024   History of present illness 83 yo female who presents 5/2 with a few days of worsening weakness. Acute on Chronic Anemia. Multiple falls. PMH: ESRD on HD TThS, T2DM, nonrheumatic mitral valve regurgitation, HFpEF, advanced PVD, anemia of chronic disease, and hx of duodenal/jejunal angioectasias in 2022.   OT comments  Patient progressing and showed improved transfers and ambulation with RW and no external assistance, compared to previous session.  Visit focused on standing ADLs and education for bathroom adaptive equipment with handouts provided for reinforcement.  Patient remains limited by generalized weakness, decreased awareness of impairments, constipation, and decreased activity tolerance along with deficits noted below. Pt continues to demonstrate very good rehab potential and would benefit from continued skilled OT to increase safety and independence with ADLs and functional transfers to allow pt to return home safely and reduce caregiver burden and fall risk.       If plan is discharge home, recommend the following:  A little help with walking and/or transfers;A little help with bathing/dressing/bathroom;Assistance with cooking/housework;Direct supervision/assist for medications management;Direct supervision/assist for financial management;Assist for transportation;Help with stairs or ramp for entrance   Equipment Recommendations  Tub/shower seat    Recommendations for Other Services      Precautions / Restrictions Precautions Precautions: Fall Recall of Precautions/Restrictions: Intact Restrictions Weight Bearing Restrictions Per Provider Order: No       Mobility Bed Mobility               General bed mobility comments: Pt received in recliner.    Transfers Overall transfer level: Needs assistance Equipment used: Rolling walker (2  wheels) Transfers: Sit to/from Stand, Bed to chair/wheelchair/BSC Sit to Stand: Supervision                 Balance Overall balance assessment: Mild deficits observed, not formally tested                                         ADL either performed or assessed with clinical judgement   ADL Overall ADL's : Needs assistance/impaired Eating/Feeding: Sitting;Independent   Grooming: Wash/dry hands;Standing;Modified independent   Upper Body Bathing: Set up;Sitting Upper Body Bathing Details (indicate cue type and reason): Pt reports she uses WIS as well as get all the way down in tub for baths which she enjoys. Pt had to pull up on daughter lawst time as she could not get out. Pt and dtr given handout and education on clamp on tub bar as alteranative to pulling up on family. Pt also agreed not to try a tub soak until she felt stronger.     Upper Body Dressing : Set up;Sitting       Toilet Transfer: Supervision/safety;Rolling walker (2 wheels);Comfort height toilet;Grab bars Toilet Transfer Details (indicate cue type and reason): Pt also ready has BAC at home but did ont know it could be used over her toilet. One toilet in her home has no grab bars or vanity to push from. Pt and family shown how to position and use of splash guard. Shown how to angle legs to avoid "pinching" as pt reports that his is why she does not use it.   Toileting - Clothing Manipulation Details (indicate cue type and reason): Handout and education on bidet as pt reports multiple wipes each void and feeling  raw in peri areas. Shown attachable bidet with instructions on positioning, use and precautions.   Tub/Shower Transfer Details (indicate cue type and reason): See bathing above. Functional mobility during ADLs: Supervision/safety;Rolling walker (2 wheels)      Extremity/Trunk Assessment Upper Extremity Assessment Upper Extremity Assessment: Generalized weakness   Lower Extremity  Assessment Lower Extremity Assessment: Defer to PT evaluation   Cervical / Trunk Assessment Cervical / Trunk Assessment: Normal    Vision   Vision Assessment?: No apparent visual deficits   Perception     Praxis     Communication Communication Communication: No apparent difficulties Factors Affecting Communication: Hearing impaired   Cognition Arousal: Alert Behavior During Therapy: WFL for tasks assessed/performed Cognition: No apparent impairments                               Following commands: Intact        Cueing      Exercises      Shoulder Instructions       General Comments      Pertinent Vitals/ Pain       Pain Assessment Pain Assessment: No/denies pain  Home Living                                          Prior Functioning/Environment              Frequency  Min 2X/week        Progress Toward Goals  OT Goals(current goals can now be found in the care plan section)  Progress towards OT goals: Progressing toward goals  Acute Rehab OT Goals Patient Stated Goal: Stay on with OT acutely and continue with [facility] OT OT Goal Formulation: With patient/family Time For Goal Achievement: 03/11/24 Potential to Achieve Goals: Good  Plan      Co-evaluation                 AM-PAC OT "6 Clicks" Daily Activity     Outcome Measure   Help from another person eating meals?: None Help from another person taking care of personal grooming?: A Little Help from another person toileting, which includes using toliet, bedpan, or urinal?: A Little Help from another person bathing (including washing, rinsing, drying)?: A Lot Help from another person to put on and taking off regular upper body clothing?: A Little Help from another person to put on and taking off regular lower body clothing?: A Lot 6 Click Score: 17    End of Session Equipment Utilized During Treatment: Gait belt;Rolling walker (2 wheels)  OT Visit  Diagnosis: Unsteadiness on feet (R26.81);Other abnormalities of gait and mobility (R26.89);Muscle weakness (generalized) (M62.81);History of falling (Z91.81)   Activity Tolerance Patient tolerated treatment well   Patient Left with call bell/phone within reach;with bed alarm set;with family/visitor present;in chair   Nurse Communication Mobility status        Time: 1257-1330 OT Time Calculation (min): 33 min  Charges: OT General Charges $OT Visit: 1 Visit OT Treatments $Self Care/Home Management : 8-22 mins $Therapeutic Activity: 8-22 mins  [facility], OT Acute Rehab Services Office: [phone] 03/01/2024   Asher Blade 03/01/2024, 1:39 PM

## 2024-03-01 NOTE — Progress Notes (Signed)
 [facility] Progress Note   Subjective:    Seen and examined patient at bedside. Tolerated yesterday's HD with net UF 2.5L. S/p capsule enteroscopy 5/5 and awaiting results. Next HD 5/8.  Objective Vitals:   02/29/24 1943 02/29/24 2120 03/01/24 0343 03/01/24 0600  BP: (!) 102/50  (!) 120/48   Pulse: 93 84 71   Resp: 16  16   Temp: 98.9 F (37.2 C)  98.7 F (37.1 C)   TempSrc: Oral  Oral   SpO2: 98% 100% 100%   Weight:    72.8 kg  Height:       Physical Exam General: Alert female in NAD; on RA Heart: RRR, no murmurs, rubs or gallops Lungs: CTA bilaterally, respirations unlabored Abdomen: Soft, non-distended Extremities: no edema b/l lower extremities Dialysis Access: AVG +t/b  Filed Weights   02/29/24 0845 02/29/24 1215 03/01/24 0600  Weight: 76.4 kg 74.4 kg 72.8 kg    Intake/Output Summary (Last 24 hours) at 03/01/2024 1001 Last data filed at 02/29/2024 1300 Gross per 24 hour  Intake --  Output 2600 ml  Net -2600 ml    Additional Objective Labs: Basic Metabolic Panel: Recent Labs  Lab 02/28/24 1224 02/29/24 0459 03/01/24 0731  NA 128* 123* 124*  K 5.1 4.5 4.2  CL 91* 86* 87*  CO2 23 21* 25  GLUCOSE 136* 66* 172*  BUN 62* 66* 37*  CREATININE 5.57* 5.71* 4.36*  CALCIUM  9.0 8.7* 8.8*  PHOS 3.7 3.5 3.4   Liver Function Tests: Recent Labs  Lab 02/25/24 1008 02/26/24 0544 02/28/24 1224 02/29/24 0459 03/01/24 0731  AST 32  --   --   --   --   ALT 26  --   --   --   --   ALKPHOS 65  --   --   --   --   BILITOT 1.1  --   --   --   --   PROT 5.1*  --   --   --   --   ALBUMIN 2.5*   < > 2.6* 2.5* 2.6*   < > = values in this interval not displayed.   No results for input(s): "LIPASE", "AMYLASE" in the last 168 hours. CBC: Recent Labs  Lab 02/26/24 1715 02/27/24 0523 02/28/24 0954 02/28/24 1224 02/29/24 0459 03/01/24 0731  WBC 6.0 5.5  --  7.2 6.6 6.2  HGB 9.9* 9.5*   < > 8.7* 8.7* 8.8*  HCT 29.7* 28.9*   < > 27.7* 25.8* 27.4*   MCV 102.4* 102.5*  --  104.5* 102.4* 104.6*  PLT 178 159  --  149* 149* 157   < > = values in this interval not displayed.   Blood Culture    Component Value Date/Time   SDES BLOOD RIGHT HAND 03/16/2021 0046   SPECREQUEST  03/16/2021 0046    BOTTLES DRAWN AEROBIC ONLY Blood Culture results may not be optimal due to an inadequate volume of blood received in culture bottles   CULT  03/16/2021 0046    NO GROWTH 5 DAYS Performed at [facility], [address], [place], [place] [address]    REPTSTATUS 03/21/2021 FINAL 03/16/2021 0046    Cardiac Enzymes: No results for input(s): "CKTOTAL", "CKMB", "CKMBINDEX", "TROPONINI" in the last 168 hours. CBG: Recent Labs  Lab 02/29/24 0857 02/29/24 1329 02/29/24 1458 02/29/24 2100 03/01/24 0800  GLUCAP 141* 41* 106* 215* 213*   Iron  Studies: No results for input(s): "IRON ", "TIBC", "TRANSFERRIN", "FERRITIN" in  the last 72 hours. Lab Results  Component Value Date   INR 1.0 03/29/2021   Studies/Results: No results found.  Medications:   carvedilol   6.25 mg Oral BID   Chlorhexidine  Gluconate Cloth  6 each Topical Q0600   feeding supplement  237 mL Oral BID BM   ferric citrate   210 mg Oral TID WC   furosemide   80 mg Oral Daily   insulin  aspart  0-5 Units Subcutaneous QHS   insulin  aspart  0-6 Units Subcutaneous TID WC   pantoprazole  (PROTONIX ) IV  40 mg Intravenous Q12H   polyethylene glycol  17 g Oral Daily    Dialysis Orders: [facility] 3.5h   B400    70.6kg    LUA AVG   Heparin  none Last OP HD 5/01, post wt 70.9 Comes off 0-2kg over, bp's wnl on HD Mircera 200mcg IV q 2 weeks- last dose 02/24/24  Assessment/Plan: Acute on chronic anemia/ generalized weakness: FOBT+, Hb 6.6 on admission. S/p endoscopy but stopped early due to hypotension and bradycardia. Noted per GI note from 5/4, BB was held. 5/5 EGD unrevealing.  S/p capsule enteroscopy 5/5-awaiting results  ESRD: on HD TTS. Next HD 5/8 per her usual  schedule. HTN: BP soft/stable.  Volume: Does have trace bilateral lower extremity edema. UF with HD as BP allows. Anemia of esrd + ABLA:  s/p PRBC transfusion. ESA given 02/24/24. Trending H/H.  Secondary hyperparathyroidism: Cca and phos in range, cont binders ac tid.  IDDM: per pmd  Jadene Maxwell, NP [facility] 03/01/2024,10:01 AM  LOS: 0 days

## 2024-03-01 NOTE — Plan of Care (Signed)
 Pt alert and oriented x4. Skin warm and dry. Assistance given to pt when out of the bed, to the bathroom. Small bowel movement.no complaints of pain.gI pack still in place.tolerating ice encouraged to call for assistance as needed. Call light in reach. Sr x3 elevated. Bed in low position and alarm activated.

## 2024-03-02 ENCOUNTER — Inpatient Hospital Stay (HOSPITAL_COMMUNITY)

## 2024-03-02 ENCOUNTER — Encounter (HOSPITAL_COMMUNITY): Payer: Self-pay | Admitting: Internal Medicine

## 2024-03-02 ENCOUNTER — Encounter (HOSPITAL_COMMUNITY)
Admission: EM | Disposition: A | Discharge status: Home Health | Payer: Self-pay | Source: Home / Self Care | Attending: Internal Medicine

## 2024-03-02 DIAGNOSIS — E119 Type 2 diabetes mellitus without complications: Secondary | ICD-10-CM

## 2024-03-02 DIAGNOSIS — I5021 Acute systolic (congestive) heart failure: Secondary | ICD-10-CM | POA: Diagnosis not present

## 2024-03-02 DIAGNOSIS — K31819 Angiodysplasia of stomach and duodenum without bleeding: Secondary | ICD-10-CM

## 2024-03-02 DIAGNOSIS — E1122 Type 2 diabetes mellitus with diabetic chronic kidney disease: Secondary | ICD-10-CM

## 2024-03-02 DIAGNOSIS — I132 Hypertensive heart and chronic kidney disease with heart failure and with stage 5 chronic kidney disease, or end stage renal disease: Secondary | ICD-10-CM | POA: Diagnosis not present

## 2024-03-02 DIAGNOSIS — K922 Gastrointestinal hemorrhage, unspecified: Secondary | ICD-10-CM | POA: Diagnosis not present

## 2024-03-02 DIAGNOSIS — Z66 Do not resuscitate: Secondary | ICD-10-CM | POA: Diagnosis not present

## 2024-03-02 DIAGNOSIS — J9600 Acute respiratory failure, unspecified whether with hypoxia or hypercapnia: Secondary | ICD-10-CM

## 2024-03-02 DIAGNOSIS — I120 Hypertensive chronic kidney disease with stage 5 chronic kidney disease or end stage renal disease: Secondary | ICD-10-CM | POA: Diagnosis not present

## 2024-03-02 DIAGNOSIS — I469 Cardiac arrest, cause unspecified: Secondary | ICD-10-CM | POA: Diagnosis present

## 2024-03-02 DIAGNOSIS — N186 End stage renal disease: Secondary | ICD-10-CM | POA: Diagnosis not present

## 2024-03-02 DIAGNOSIS — D62 Acute posthemorrhagic anemia: Secondary | ICD-10-CM | POA: Diagnosis not present

## 2024-03-02 DIAGNOSIS — I959 Hypotension, unspecified: Secondary | ICD-10-CM

## 2024-03-02 DIAGNOSIS — K31811 Angiodysplasia of stomach and duodenum with bleeding: Secondary | ICD-10-CM | POA: Diagnosis not present

## 2024-03-02 HISTORY — PX: HOT HEMOSTASIS: SHX5433

## 2024-03-02 HISTORY — PX: BALLOON ENTEROSCOPY: SHX6863

## 2024-03-02 HISTORY — DX: Cardiac arrest, cause unspecified: I46.9

## 2024-03-02 LAB — POCT I-STAT 7, (LYTES, BLD GAS, ICA,H+H)
Acid-base deficit: 11 mmol/L — ABNORMAL HIGH (ref 0.0–2.0)
Acid-base deficit: 6 mmol/L — ABNORMAL HIGH (ref 0.0–2.0)
Acid-base deficit: 3 mmol/L — ABNORMAL HIGH (ref 0.0–2.0)
Bicarbonate: 16.9 mmol/L — ABNORMAL LOW (ref 20.0–28.0)
Bicarbonate: 19.9 mmol/L — ABNORMAL LOW (ref 20.0–28.0)
Bicarbonate: 22.5 mmol/L (ref 20.0–28.0)
Calcium, Ion: 1.21 mmol/L (ref 1.15–1.40)
Calcium, Ion: 1.34 mmol/L (ref 1.15–1.40)
Calcium, Ion: 1.14 mmol/L — ABNORMAL LOW (ref 1.15–1.40)
HCT: 20 % — ABNORMAL LOW (ref 36.0–46.0)
HCT: 24 % — ABNORMAL LOW (ref 36.0–46.0)
HCT: 21 % — ABNORMAL LOW (ref 36.0–46.0)
Hemoglobin: 6.8 g/dL — CL (ref 12.0–15.0)
Hemoglobin: 8.2 g/dL — ABNORMAL LOW (ref 12.0–15.0)
Hemoglobin: 7.1 g/dL — ABNORMAL LOW (ref 12.0–15.0)
O2 Saturation: 100 %
O2 Saturation: 99 %
O2 Saturation: 100 %
Patient temperature: 98.5
Potassium: 5.1 mmol/L (ref 3.5–5.1)
Potassium: 5.3 mmol/L — ABNORMAL HIGH (ref 3.5–5.1)
Potassium: 5.7 mmol/L — ABNORMAL HIGH (ref 3.5–5.1)
Sodium: 118 mmol/L — CL (ref 135–145)
Sodium: 119 mmol/L — CL (ref 135–145)
Sodium: 119 mmol/L — CL (ref 135–145)
TCO2: 18 mmol/L — ABNORMAL LOW (ref 22–32)
TCO2: 21 mmol/L — ABNORMAL LOW (ref 22–32)
TCO2: 24 mmol/L (ref 22–32)
pCO2 arterial: 42.5 mmHg (ref 32–48)
pCO2 arterial: 44.6 mmHg (ref 32–48)
pCO2 arterial: 44.1 mmHg (ref 32–48)
pH, Arterial: 7.187 — CL (ref 7.35–7.45)
pH, Arterial: 7.278 — ABNORMAL LOW (ref 7.35–7.45)
pH, Arterial: 7.315 — ABNORMAL LOW (ref 7.35–7.45)
pO2, Arterial: 193 mmHg — ABNORMAL HIGH (ref 83–108)
pO2, Arterial: 434 mmHg — ABNORMAL HIGH (ref 83–108)
pO2, Arterial: 395 mmHg — ABNORMAL HIGH (ref 83–108)

## 2024-03-02 LAB — ECHOCARDIOGRAM COMPLETE
AR max vel: 1.36 cm2
AV Peak grad: 13 mmHg
Ao pk vel: 1.8 m/s
Area-P 1/2: 3.81 cm2
Height: 61 in
MV VTI: 1.64 cm2
S' Lateral: 4.1 cm
Weight: 2569.6 [oz_av]

## 2024-03-02 LAB — CBC
HCT: 27 % — ABNORMAL LOW (ref 36.0–46.0)
HCT: 21.6 % — ABNORMAL LOW (ref 36.0–46.0)
Hemoglobin: 9 g/dL — ABNORMAL LOW (ref 12.0–15.0)
Hemoglobin: 7 g/dL — ABNORMAL LOW (ref 12.0–15.0)
MCH: 34.1 pg — ABNORMAL HIGH (ref 26.0–34.0)
MCH: 34.5 pg — ABNORMAL HIGH (ref 26.0–34.0)
MCHC: 33.3 g/dL (ref 30.0–36.0)
MCHC: 32.4 g/dL (ref 30.0–36.0)
MCV: 102.3 fL — ABNORMAL HIGH (ref 80.0–100.0)
MCV: 106.4 fL — ABNORMAL HIGH (ref 80.0–100.0)
Platelets: 146 10*3/uL — ABNORMAL LOW (ref 150–400)
Platelets: 129 10*3/uL — ABNORMAL LOW (ref 150–400)
RBC: 2.64 MIL/uL — ABNORMAL LOW (ref 3.87–5.11)
RBC: 2.03 MIL/uL — ABNORMAL LOW (ref 3.87–5.11)
RDW: 22 % — ABNORMAL HIGH (ref 11.5–15.5)
RDW: 22 % — ABNORMAL HIGH (ref 11.5–15.5)
WBC: 6.4 10*3/uL (ref 4.0–10.5)
WBC: 6 10*3/uL (ref 4.0–10.5)
nRBC: 0 % (ref 0.0–0.2)
nRBC: 0 % (ref 0.0–0.2)

## 2024-03-02 LAB — BASIC METABOLIC PANEL WITH GFR
Anion gap: 13 (ref 5–15)
BUN: 55 mg/dL — ABNORMAL HIGH (ref 8–23)
CO2: 23 mmol/L (ref 22–32)
Calcium: 8.8 mg/dL — ABNORMAL LOW (ref 8.9–10.3)
Chloride: 84 mmol/L — ABNORMAL LOW (ref 98–111)
Creatinine, Ser: 5.39 mg/dL — ABNORMAL HIGH (ref 0.44–1.00)
GFR, Estimated: 7 mL/min — ABNORMAL LOW (ref >60)
Glucose, Bld: 250 mg/dL — ABNORMAL HIGH (ref 70–99)
Potassium: 5.6 mmol/L — ABNORMAL HIGH (ref 3.5–5.1)
Sodium: 120 mmol/L — ABNORMAL LOW (ref 135–145)

## 2024-03-02 LAB — PROTIME-INR
INR: 1.4 — ABNORMAL HIGH (ref 0.8–1.2)
Prothrombin Time: 17.7 s — ABNORMAL HIGH (ref 11.4–15.2)

## 2024-03-02 LAB — COMPREHENSIVE METABOLIC PANEL WITH GFR
ALT: 52 U/L — ABNORMAL HIGH (ref 0–44)
AST: 74 U/L — ABNORMAL HIGH (ref 15–41)
Albumin: 2.7 g/dL — ABNORMAL LOW (ref 3.5–5.0)
Alkaline Phosphatase: 57 U/L (ref 38–126)
Anion gap: 15 (ref 5–15)
BUN: 55 mg/dL — ABNORMAL HIGH (ref 8–23)
CO2: 18 mmol/L — ABNORMAL LOW (ref 22–32)
Calcium: 8.6 mg/dL — ABNORMAL LOW (ref 8.9–10.3)
Chloride: 88 mmol/L — ABNORMAL LOW (ref 98–111)
Creatinine, Ser: 5.18 mg/dL — ABNORMAL HIGH (ref 0.44–1.00)
GFR, Estimated: 8 mL/min — ABNORMAL LOW (ref >60)
Glucose, Bld: 256 mg/dL — ABNORMAL HIGH (ref 70–99)
Potassium: 5.3 mmol/L — ABNORMAL HIGH (ref 3.5–5.1)
Sodium: 121 mmol/L — ABNORMAL LOW (ref 135–145)
Total Bilirubin: 1.1 mg/dL (ref 0.0–1.2)
Total Protein: 4.8 g/dL — ABNORMAL LOW (ref 6.5–8.1)

## 2024-03-02 LAB — PREPARE RBC (CROSSMATCH): Order Confirmation: POSITIVE

## 2024-03-02 LAB — MAGNESIUM: Magnesium: 1.9 mg/dL (ref 1.7–2.4)

## 2024-03-02 LAB — APTT: aPTT: 33 s (ref 24–36)

## 2024-03-02 LAB — GLUCOSE, CAPILLARY
Glucose-Capillary: 237 mg/dL — ABNORMAL HIGH (ref 70–99)
Glucose-Capillary: 240 mg/dL — ABNORMAL HIGH (ref 70–99)
Glucose-Capillary: 111 mg/dL — ABNORMAL HIGH (ref 70–99)

## 2024-03-02 LAB — LIPASE, BLOOD: Lipase: 66 U/L — ABNORMAL HIGH (ref 11–51)

## 2024-03-02 LAB — PHOSPHORUS: Phosphorus: 5.5 mg/dL — ABNORMAL HIGH (ref 2.5–4.6)

## 2024-03-02 LAB — AMYLASE: Amylase: 29 U/L (ref 28–100)

## 2024-03-02 SURGERY — EGD, WITH ARGON PLASMA COAGULATION
Anesthesia: Monitor Anesthesia Care

## 2024-03-02 MED ORDER — SODIUM CHLORIDE 0.9% FLUSH
3.0000 mL | Freq: Two times a day (BID) | INTRAVENOUS | Status: DC
  Administered 2024-03-02 – 2024-03-04 (×4): 3 mL via INTRAVENOUS

## 2024-03-02 MED ORDER — SODIUM ZIRCONIUM CYCLOSILICATE 10 G PO PACK
10.0000 g | PACK | Freq: Once | ORAL | Status: DC
  Filled 2024-03-02: qty 1

## 2024-03-02 MED ORDER — SODIUM CHLORIDE 0.9% FLUSH: 3.0000 mL | INTRAVENOUS | Status: DC | PRN

## 2024-03-02 MED ORDER — BENZONATATE 100 MG PO CAPS
200.0000 mg | ORAL_CAPSULE | Freq: Three times a day (TID) | ORAL | Status: DC | PRN
  Administered 2024-03-02 – 2024-03-07 (×2): 200 mg via ORAL
  Filled 2024-03-02 (×2): qty 2

## 2024-03-02 MED ORDER — CALCIUM CHLORIDE 10 % IV SOLN
INTRAVENOUS | Status: DC | PRN
  Administered 2024-03-02: 550 mg via INTRAVENOUS

## 2024-03-02 MED ORDER — PHENYLEPHRINE HCL-NACL 20-0.9 MG/250ML-% IV SOLN
INTRAVENOUS | Status: DC | PRN
  Administered 2024-03-02: 50 ug via INTRAVENOUS
  Administered 2024-03-02: 75 ug/min via INTRAVENOUS

## 2024-03-02 MED ORDER — SODIUM BICARBONATE 8.4 % IV SOLN
50.0000 meq | Freq: Once | INTRAVENOUS | Status: AC
  Administered 2024-03-02: 50 meq via INTRAVENOUS

## 2024-03-02 MED ORDER — DOCUSATE SODIUM 50 MG/5ML PO LIQD
100.0000 mg | Freq: Two times a day (BID) | ORAL | Status: DC
  Administered 2024-03-03: 100 mg
  Filled 2024-03-02: qty 10

## 2024-03-02 MED ORDER — SPOT INK MARKER SYRINGE KIT
PACK | SUBMUCOSAL | Status: AC
  Filled 2024-03-02: qty 5

## 2024-03-02 MED ORDER — EPINEPHRINE 1 MG/10ML IJ SOSY
PREFILLED_SYRINGE | INTRAMUSCULAR | Status: DC | PRN
Start: 2024-03-02 — End: 2024-03-02
  Administered 2024-03-02: 1 mg via INTRAVENOUS

## 2024-03-02 MED ORDER — SODIUM BICARBONATE 8.4 % IV SOLN: 50.0000 meq | Freq: Once | INTRAVENOUS | Status: DC

## 2024-03-02 MED ORDER — SUCCINYLCHOLINE CHLORIDE 200 MG/10ML IV SOSY
PREFILLED_SYRINGE | INTRAVENOUS | Status: DC | PRN
  Administered 2024-03-02: 120 mg via INTRAVENOUS
  Administered 2024-03-02: 50 mg via INTRAVENOUS

## 2024-03-02 MED ORDER — GLYCOPYRROLATE PF 0.2 MG/ML IJ SOSY
PREFILLED_SYRINGE | INTRAMUSCULAR | Status: DC | PRN
  Administered 2024-03-02: .2 mg via INTRAVENOUS

## 2024-03-02 MED ORDER — SODIUM CHLORIDE 0.9 % IV SOLN
INTRAVENOUS | Status: AC | PRN
  Administered 2024-03-02: 250 mL via INTRAVENOUS

## 2024-03-02 MED ORDER — ALBUMIN HUMAN 5 % IV SOLN
INTRAVENOUS | Status: DC | PRN
Start: 2024-03-02 — End: 2024-03-02

## 2024-03-02 MED ORDER — DOCUSATE SODIUM 100 MG PO CAPS: 100.0000 mg | ORAL_CAPSULE | Freq: Two times a day (BID) | ORAL | Status: DC | PRN

## 2024-03-02 MED ORDER — FENTANYL CITRATE (PF) 100 MCG/2ML IJ SOLN: 25.0000 ug | Freq: Once | INTRAMUSCULAR | Status: DC

## 2024-03-02 MED ORDER — SODIUM BICARBONATE 8.4 % IV SOLN: 100.0000 meq | Freq: Once | INTRAVENOUS | Status: DC

## 2024-03-02 MED ORDER — SODIUM CHLORIDE 0.9 % IV SOLN: 250.0000 mL | INTRAVENOUS | Status: AC | PRN

## 2024-03-02 MED ORDER — NOREPINEPHRINE 4 MG/250ML-% IV SOLN
0.0000 ug/min | INTRAVENOUS | Status: DC
  Administered 2024-03-02: 2 ug/min via INTRAVENOUS

## 2024-03-02 MED ORDER — NOREPINEPHRINE 4 MG/250ML-% IV SOLN
INTRAVENOUS | Status: AC
  Filled 2024-03-02: qty 250

## 2024-03-02 MED ORDER — SODIUM BICARBONATE 8.4 % IV SOLN
INTRAVENOUS | Status: AC
  Filled 2024-03-02: qty 50

## 2024-03-02 MED ORDER — POLYETHYLENE GLYCOL 3350 17 G PO PACK: 17.0000 g | PACK | Freq: Every day | ORAL | Status: DC

## 2024-03-02 MED ORDER — VASOPRESSIN 20 UNIT/ML IV SOLN
INTRAVENOUS | Status: DC | PRN
  Administered 2024-03-02: 4 [IU] via INTRAVENOUS
  Administered 2024-03-02: 2 [IU] via INTRAVENOUS
  Administered 2024-03-02: 4 [IU] via INTRAVENOUS

## 2024-03-02 MED ORDER — MIDAZOLAM HCL 2 MG/2ML IJ SOLN: 1.0000 mg | INTRAMUSCULAR | Status: DC | PRN

## 2024-03-02 MED ORDER — LIDOCAINE 2% (20 MG/ML) 5 ML SYRINGE
INTRAMUSCULAR | Status: DC | PRN
  Administered 2024-03-02: 100 mg via INTRAVENOUS

## 2024-03-02 MED ORDER — FENTANYL 2500MCG IN NS 250ML (10MCG/ML) PREMIX INFUSION
25.0000 ug/h | INTRAVENOUS | Status: DC
  Administered 2024-03-02: 25 ug/h via INTRAVENOUS
  Filled 2024-03-02: qty 250

## 2024-03-02 MED ORDER — POLYETHYLENE GLYCOL 3350 17 G PO PACK: 17.0000 g | PACK | Freq: Every day | ORAL | Status: DC | PRN

## 2024-03-02 MED ORDER — MELATONIN 3 MG PO TABS
3.0000 mg | ORAL_TABLET | Freq: Every evening | ORAL | Status: DC | PRN
  Administered 2024-03-02 – 2024-03-10 (×6): 3 mg via ORAL
  Filled 2024-03-02 (×6): qty 1

## 2024-03-02 MED ORDER — SODIUM CHLORIDE 0.9% IV SOLUTION: Freq: Once | INTRAVENOUS | Status: DC

## 2024-03-02 MED ORDER — FENTANYL BOLUS VIA INFUSION: 25.0000 ug | INTRAVENOUS | Status: DC | PRN

## 2024-03-02 MED ORDER — PHENYLEPHRINE 80 MCG/ML (10ML) SYRINGE FOR IV PUSH (FOR BLOOD PRESSURE SUPPORT)
PREFILLED_SYRINGE | INTRAVENOUS | Status: DC | PRN
  Administered 2024-03-02 (×5): 160 ug via INTRAVENOUS

## 2024-03-02 MED ORDER — PROPOFOL 500 MG/50ML IV EMUL
INTRAVENOUS | Status: DC | PRN
  Administered 2024-03-02: 100 ug/kg/min via INTRAVENOUS

## 2024-03-02 NOTE — Anesthesia Procedure Notes (Addendum)
 Procedure Name: Intubation Date/Time: 03/02/2024 9:28 AM  Performed by: Viki Graver, CRNAPre-anesthesia Checklist: Patient identified, Emergency Drugs available, Suction available and Patient being monitored Patient Re-evaluated:Patient Re-evaluated prior to induction Oxygen Delivery Method: Circle System Utilized Preoxygenation: Pre-oxygenation with 100% oxygen Induction Type: IV induction, Cricoid Pressure applied and Rapid sequence Ventilation: Mask ventilation without difficulty Laryngoscope Size: Miller and 2 Grade View: Grade I Tube type: Oral Tube size: 7.0 mm Number of attempts: 1 Airway Equipment and Method: Stylet and Bite block Placement Confirmation: ETT inserted through vocal cords under direct vision, positive ETCO2 and breath sounds checked- equal and bilateral Secured at: 22 cm Tube secured with: Tape Dental Injury: Teeth and Oropharynx as per pre-operative assessment

## 2024-03-02 NOTE — Plan of Care (Signed)
  Problem: Education: Goal: Knowledge of General Education information will improve Description: Including pain rating scale, medication(s)/side effects and non-pharmacologic comfort measures Outcome: Progressing   Problem: Nutrition: Goal: Adequate nutrition will be maintained Outcome: Progressing   Problem: Coping: Goal: Level of anxiety will decrease Outcome: Progressing   Problem: Coping: Goal: Ability to adjust to condition or change in health will improve Outcome: Progressing   Problem: Fluid Volume: Goal: Ability to maintain a balanced intake and output will improve Outcome: Progressing   Problem: Skin Integrity: Goal: Risk for impaired skin integrity will decrease Outcome: Progressing

## 2024-03-02 NOTE — Anesthesia Postprocedure Evaluation (Signed)
 Anesthesia Post Note  Patient: Anna Carr  Procedure(s) Performed: EGD, WITH ARGON PLASMA COAGULATION ENTEROSCOPY, USING BALLOON     Patient location during evaluation: SICU Anesthesia Type: General Level of consciousness: sedated Pain management: pain level controlled Vital Signs Assessment: post-procedure vital signs reviewed and stable Respiratory status: patient on ventilator - see flowsheet for VS Cardiovascular status: stable Postop Assessment: no apparent nausea or vomiting Anesthetic complications: yes Comments: Intraoperative code. ROSC after one round of chest compressions and 1mg  epi. See chart for details.   No notable events documented.  Last Vitals:  Vitals:   03/02/24 0751 03/02/24 0834  BP: 121/65 (!) 143/66  Pulse: 85 85  Resp:  (!) 22  Temp: (!) 36.3 C (!) 36.4 C  SpO2: 100% 93%    Last Pain:  Vitals:   03/02/24 0834  TempSrc: Temporal  PainSc: 0-No pain                 [name]

## 2024-03-02 NOTE — Plan of Care (Signed)

## 2024-03-02 NOTE — Transfer of Care (Signed)
 Immediate Anesthesia Transfer of Care Note  Patient: Anna Carr  Procedure(s) Performed: EGD, WITH ARGON PLASMA COAGULATION ENTEROSCOPY, USING BALLOON  Patient Location: ICU  Anesthesia Type:General  Level of Consciousness: sedated and Patient remains intubated per anesthesia plan  Airway & Oxygen Therapy: Patient remains intubated per anesthesia plan and Patient placed on Ventilator (see vital sign flow sheet for setting)  Post-op Assessment: vital signs stable  Post vital signs: Reviewed and stable  Last Vitals:  Vitals Value Taken Time  BP 138/72 03/02/24 1055  Temp    Pulse 72 03/02/24 1104  Resp 20 03/02/24 1104  SpO2 97 % 03/02/24 1104  Vitals shown include unfiled device data.  Last Pain:  Vitals:   03/02/24 0834  TempSrc: Temporal  PainSc: 0-No pain      Patients Stated Pain Goal: 0 (02/27/24 2302)  Complications: No notable events documented.

## 2024-03-02 NOTE — Progress Notes (Signed)
 [facility] Progress Note   Subjective:    Noted video capsule endoscopy found abnormalities and patient returned back to endoscopy today for small bowel enteroscopy. Unfortunately, patient suffered a cardiac arrest near the end of the procedure. CPR was performed and ROSC was achieved. Seen and examined patient in the ICU. Currently intubated and on the vent. It appears she's trying to wake up. Pressors and sedation are now off and labs drawn. BP at bedside is 146/66. Na now 119 and K+ 5.7. Discussed with Critical Care NP and Dr. Irene Mannheim. Plan for IHD at bedside later this evening.   Objective Vitals:   03/01/24 1933 03/02/24 0357 03/02/24 0751 03/02/24 0834  BP: (!) 118/56 (!) 128/57 121/65 (!) 143/66  Pulse: 72 86 85 85  Resp: 16 20  (!) 22  Temp: 97.8 F (36.6 C) 97.8 F (36.6 C) (!) 97.4 F (36.3 C) (!) 97.5 F (36.4 C)  TempSrc:    Temporal  SpO2: 92% 100% 100% 93%  Weight:      Height:       Physical Exam General: Intubated and on the vent; opens eyes to painful stimuli Heart: RRR, no murmurs, rubs or gallops Lungs: Clear anteriorly and breathing with the vent Abdomen: Soft, non-distended Extremities: trace edema b/l lower extremities Dialysis Access: AVG +t/b  Filed Weights   02/29/24 0845 02/29/24 1215 03/01/24 0600  Weight: 76.4 kg 74.4 kg 72.8 kg    Intake/Output Summary (Last 24 hours) at 03/02/2024 1327 Last data filed at 03/02/2024 1024 Gross per 24 hour  Intake 700 ml  Output --  Net 700 ml    Additional Objective Labs: Basic Metabolic Panel: Recent Labs  Lab 02/29/24 0459 03/01/24 0731 03/02/24 0550 03/02/24 1032 03/02/24 1107 03/02/24 1132 03/02/24 1223  NA 123* 124* 120*   < > 118* 121* 119*  K 4.5 4.2 5.6*   < > 5.1 5.3* 5.7*  CL 86* 87* 84*  --   --  88*  --   CO2 21* 25 23  --   --  18*  --   GLUCOSE 66* 172* 250*  --   --  256*  --   BUN 66* 37* 55*  --   --  55*  --   CREATININE 5.71* 4.36* 5.39*  --   --  5.18*  --    CALCIUM  8.7* 8.8* 8.8*  --   --  8.6*  --   PHOS 3.5 3.4  --   --   --  5.5*  --    < > = values in this interval not displayed.   Liver Function Tests: Recent Labs  Lab 02/25/24 1008 02/26/24 0544 02/29/24 0459 03/01/24 0731 03/02/24 1132  AST 32  --   --   --  74*  ALT 26  --   --   --  52*  ALKPHOS 65  --   --   --  57  BILITOT 1.1  --   --   --  1.1  PROT 5.1*  --   --   --  4.8*  ALBUMIN 2.5*   < > 2.5* 2.6* 2.7*   < > = values in this interval not displayed.   Recent Labs  Lab 03/02/24 1132  LIPASE 66*  AMYLASE 29   CBC: Recent Labs  Lab 02/28/24 1224 02/29/24 0459 03/01/24 0731 03/02/24 0550 03/02/24 1032 03/02/24 1107 03/02/24 1132 03/02/24 1223  WBC 7.2 6.6 6.2 6.4  --   --  6.0  --   HGB 8.7* 8.7* 8.8* 9.0*   < > 6.8* 7.0* 7.1*  HCT 27.7* 25.8* 27.4* 27.0*   < > 20.0* 21.6* 21.0*  MCV 104.5* 102.4* 104.6* 102.3*  --   --  106.4*  --   PLT 149* 149* 157 146*  --   --  129*  --    < > = values in this interval not displayed.   Blood Culture    Component Value Date/Time   SDES BLOOD RIGHT HAND 03/16/2021 0046   SPECREQUEST  03/16/2021 0046    BOTTLES DRAWN AEROBIC ONLY Blood Culture results may not be optimal due to an inadequate volume of blood received in culture bottles   CULT  03/16/2021 0046    NO GROWTH 5 DAYS Performed at [facility], [address] [place], [place] [address]    REPTSTATUS 03/21/2021 FINAL 03/16/2021 0046    Cardiac Enzymes: No results for input(s): "CKTOTAL", "CKMB", "CKMBINDEX", "TROPONINI" in the last 168 hours. CBG: Recent Labs  Lab 03/01/24 1210 03/01/24 1657 03/01/24 2203 03/02/24 0752 03/02/24 1108  GLUCAP 251* 193* 189* 237* 240*   Iron  Studies: No results for input(s): "IRON ", "TIBC", "TRANSFERRIN", "FERRITIN" in the last 72 hours. Lab Results  Component Value Date   INR 1.4 (H) 03/02/2024   INR 1.0 03/29/2021   Studies/Results: DG Abd 1 View Result Date: 03/02/2024 CLINICAL DATA:  Encounter  for orogastric tube placement. EXAM: ABDOMEN - 1 VIEW COMPARISON:  CT 03/30/2021 FINDINGS: Tip and side port of the enteric tube below the diaphragm in the stomach. Increased air throughout nondilated small bowel centrally in the abdomen. Extensive aortic atherosclerosis. Gallstones. IMPRESSION: Tip and side port of the enteric tube below the diaphragm in the stomach. Electronically Signed   By: Chadwick Colonel M.D.   On: 03/02/2024 13:18   DG CHEST PORT 1 VIEW Result Date: 03/02/2024 CLINICAL DATA:  [id number] Intubation of airway performed without difficulty [id number]. EXAM: PORTABLE CHEST 1 VIEW COMPARISON:  02/15/2024. FINDINGS: Examination is limited due to multiple external objects predominantly overlying the left hemithorax. There are nonspecific heterogeneous alveolar and interstitial opacities predominantly overlying the left upper mid lung zones, which appear more pronounced than the prior exam. These are nonspecific. There is also new left retrocardiac opacity obscuring the left hemidiaphragm, obscuring the descending thoracic aorta and blunting the left lateral costophrenic angle, which may represent combination of left lung atelectasis and/or consolidation with pleural effusion. There is also new/increasing blunting of right lateral costophrenic angle, suggesting small right pleural effusion. There is diffuse mild-to-moderate pulmonary vascular congestion. No pneumothorax. Stable cardio-mediastinal silhouette. No acute osseous abnormalities. The soft tissues are within normal limits. An endotracheal tube is seen with its tip approximately 4.3 cm above the carina. And enteric tube is seen coursing below the left hemidiaphragm with its tip and side hole outside the field of view. Left IJ central venous catheter noted with its tip overlying the proximal portion of left brachiocephalic vein. IMPRESSION: 1. New/increasing heterogeneous alveolar and interstitial opacities overlying the left upper to mid lung  zones. New/increasing left retrocardiac opacity obscuring the left hemidiaphragm and descending thoracic aorta. These are nonspecific and differential diagnosis includes asymmetric pulmonary edema versus multifocal pneumonia. 2. There is also new/increasing blunting of right lateral costophrenic angle, suggesting small right pleural effusion. 3. Support apparatus, as described above. Electronically Signed   By: Beula Brunswick M.D.   On: 03/02/2024 11:44    Medications:  sodium chloride   fentaNYL  infusion INTRAVENOUS 25 mcg/hr (03/02/24 1254)   norepinephrine (LEVOPHED) Adult infusion 2 mcg/min (03/02/24 1145)    Chlorhexidine  Gluconate Cloth  6 each Topical Q0600   docusate  100 mg Per Tube BID   feeding supplement  237 mL Oral BID BM   fentaNYL  (SUBLIMAZE ) injection  25 mcg Intravenous Once   ferric citrate   210 mg Oral TID WC   furosemide   80 mg Oral Daily   insulin  aspart  0-5 Units Subcutaneous QHS   insulin  aspart  0-6 Units Subcutaneous TID WC   polyethylene glycol  17 g Oral Daily   polyethylene glycol  17 g Per Tube Daily   sodium bicarbonate       sodium bicarbonate  100 mEq Intravenous Once   sodium bicarbonate  50 mEq Intravenous Once   sodium chloride  flush  3 mL Intravenous Q12H   sodium zirconium cyclosilicate  10 g Oral Once    Dialysis Orders: [facility] 3.5h   B400    70.6kg    LUA AVG   Heparin  none Last OP HD 5/01, post wt 70.9 Comes off 0-2kg over, bp's wnl on HD Mircera 200mcg IV q 2 weeks- last dose 02/24/24  Assessment/Plan: Acute on chronic anemia/ generalized weakness: FOBT+, Hb 6.6 on admission. S/p endoscopy last week but aborted 2nd hypotension and bradycardia. BB held. S/p EGD 5/5 unrevealing results. 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 She returned to endoscopy today for small bowel enteroscopy. See below. S/p cardiac arrest: 2nd hypotension and bradycardia. CPR performed and ROSC obtained. Now  in the ICU. Intubated/vent. Hopeful she will be extubated later today. Managed by critical care team Hyponatremia: Levels now worsening. Informed Na levels usually ranging in 120s in outpatient per primary Nephrologist. Not severely overloaded on exam. Discussed with Dr. Irene Mannheim. Plan to change Na bath on HD to 130. Monitor trends closely. 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 Plan for IHD at bedside later today. HTN: Off pressors, BP stable Volume: Does have trace bilateral lower extremity edema. UF with HD as BP allows. Anemia of esrd + ABLA:  s/p 2 units PRBC 5/2. ESA given 02/24/24. Trending H/H.  Secondary hyperparathyroidism: Cca and phos in range, cont binders ac tid.  IDDM: per pmd  Jadene Maxwell, NP [facility] 03/02/2024,1:27 PM  LOS: 1 day

## 2024-03-02 NOTE — Interval H&P Note (Signed)
 History and Physical Interval Note:  VCE positive for active bleeding in the mid small bowel. H/H stable at 9/27 today. Plan for single balloon enteroscopy for therapeutic intent. Elevated peri-procedural risks discussed at length with patient and son last evening and again with patient today, and she confirms that she would like to proceed given recurrent bleeding in mid small bowel that has been so far elusive to endoscopic intervention.   03/02/2024 8:44 AM  [facility]  has presented today for surgery, with the diagnosis of small bowel bleeding.  The various methods of treatment have been discussed with the patient and family. After consideration of risks, benefits and other options for treatment, the patient has consented to  Procedure(s): ENTEROSCOPY (N/A) as a surgical intervention.  The patient's history has been reviewed, patient examined, no change in status, stable for surgery.  I have reviewed the patient's chart and labs.  Questions were answered to the patient's satisfaction.     Laquetta Plank Brittnie Lewey

## 2024-03-02 NOTE — Procedures (Signed)
 Extubation Procedure Note  Patient Details:   Name: Anna Carr DOB: [birth year] MRN: [id number]   Airway Documentation:    Vent end date: 03/02/24 Vent end time: 1425   Evaluation  O2 sats: stable throughout Complications: No apparent complications Patient did tolerate procedure well. Bilateral Breath Sounds: Diminished, Clear   Yes Pt extubated to 3L [name]. Positive cuff leak noted.  [name] 03/02/2024, 2:26 PM

## 2024-03-02 NOTE — Progress Notes (Signed)
 PT Cancellation Note  Patient Details Name: Anna Carr MRN: [id number] DOB: Jun 14, 1941   Cancelled Treatment:    Reason Eval/Treat Not Completed: Other (comment) Pt with cardiac arrest following endoscopy procedure requiring brief CPR and now transferred to ICU.  Verdia Glad, PT, DPT Acute Rehabilitation Services Office [phone]    Claria Crofts 03/02/2024, 11:33 AM

## 2024-03-02 NOTE — Op Note (Signed)
 [facility] Patient Name: Anna Carr Procedure Date : 03/02/2024 MRN: [id number] Attending MD: Harry Lindau , MD, [id number] Date of Birth: Oct 11, 1941 CSN: [id number] Age: 83 Admit Type: Inpatient Procedure:                Small bowel enteroscopy Indications:              Abnormal video capsule endoscopy, Acute post                            hemorrhagic anemia Providers:                Harry Lindau, MD, Maxie Spaniel, RN, Kimberly Penna                            [place], Technician Referring MD:              Medicines:                Monitored Anesthesia Care, General Anesthesia Complications:            Bradycardia, Hypotension Estimated Blood Loss:     Estimated blood loss was minimal. Procedure:                Pre-Anesthesia Assessment:                           - Prior to the procedure, a History and Physical                            was performed, and patient medications and                            allergies were reviewed. The patient's tolerance of                            previous anesthesia was also reviewed. The risks                            and benefits of the procedure and the sedation                            options and risks were discussed with the patient.                            All questions were answered, and informed consent                            was obtained. Prior Anticoagulants: The patient has                            taken no anticoagulant or antiplatelet agents. ASA                            Grade Assessment: IV - A patient with severe  systemic disease that is a constant threat to life.                            After reviewing the risks and benefits, the patient                            was deemed in satisfactory condition to undergo the                            procedure.                           After obtaining informed consent, the endoscope was                            passed  under direct vision. Throughout the                            procedure, the patient's blood pressure, pulse, and                            oxygen saturations were monitored continuously. The                            SIF-Q180 ([id number]) Olympus enteroscope was                            introduced through the mouth and advanced to the                            mid-jejunum. The small bowel enteroscopy was                            technically difficult and complex due to presence                            of food, hypotension, bradycardia. The patient                            tolerated the procedure poorly.                           The enteroscope was placed into the stomach and                            noted medium amount of food in the gastric body.                            The enteroscope was immediately withdrawn and                            patient repositioned for intubation to facilitate  continued endoscopy. Shortly after intubation,                            patient developed hypotension which was corrected                            with IV albumin and vasopressin to restore normal                            perfusing blood pressure. The enteroscope was again                            inserted through the mouth and able to advance deep                            into the small bowel. The enteroscope was advanced                            approximately 140 cm distal to the pylorus. The                            single balloon apparatus was never inflated during                            this procedure. During the procedure, patient                            developed hypoxia with oxygen saturation in the mid                            70s despite being intubated and we ultimately                            decided to terminate the procedure. The endoscope                            was withdrawn with no further source of bleeding                             noted.                           Shortly after conclusion of the procedure, patient                            developed bradycardia then asystole arrest. Was                            treated with CPR with rapid ROSC. No shock given.                            Anesthesia service placed arterial line, central  line. Critical care service was consulted to                            bedside and patient was transported to ICU. Scope In: Scope Out: Findings:      The examined esophagus was normal.      A medium amount of food (residue) was found in the gastric body. As       above, the enteroscope was immediately withdrawn in favor of elective       intubation to allow for continued endoscopic evaluation with airway       protection.      Two angioectasias with no bleeding were found in the first portion of       the duodenum and in the third portion of the duodenum. Coagulation for       hemostasis using argon plasma was successful. Estimated blood loss: none.      There was no evidence of significant pathology in the remainder of the       small bowel to the mid-jejunum. Impression:               - Normal esophagus.                           - A medium amount of food (residue) in the stomach.                           - Two non-bleeding angioectasias in the duodenum.                            Treated with argon plasma coagulation (APC).                           - The examined portion of the jejunum was normal.                           - No specimens collected. Recommendation:           - Transferred patient to ICU.                           - I discussed the procedure results along with the                            periprocedural hemodynamic instability and arrest                            with her daughter by phone immediately after the                            procedure. Patient will be transported to the ICU.                            - I recommend against any future endoscopic                            procedures. Any recurrent bleeding should be  managed supportively with blood products. 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 All rights reserved. The codes documented in this report are preliminary and upon coder review may  be revised to meet current compliance requirements. Harry Lindau, MD 03/02/2024 11:01:08 AM Number of Addenda: 0

## 2024-03-02 NOTE — Anesthesia Procedure Notes (Signed)
 Central Venous Catheter Insertion Performed by: [name], MD, anesthesiologist Start/End5/05/2024 10:30 AM, 03/02/2024 10:40 AM Patient location: Pre-op. Preanesthetic checklist: patient identified, IV checked, site marked, risks and benefits discussed, surgical consent, monitors and equipment checked, pre-op evaluation, timeout performed and anesthesia consent Lidocaine  1% used for infiltration and patient sedated Hand hygiene performed  and maximum sterile barriers used  Catheter size: 8 Fr Total catheter length 16. Central line was placed.Double lumen Procedure performed using ultrasound guided technique. Ultrasound Notes:image(s) printed for medical record Attempts: 1 Following insertion, dressing applied and line sutured. Post procedure assessment: blood return through all ports  Patient tolerated the procedure well with no immediate complications.

## 2024-03-02 NOTE — Anesthesia Preprocedure Evaluation (Signed)
 Anesthesia Evaluation  Patient identified by MRN, date of birth, ID band Patient awake    Reviewed: Allergy & Precautions, NPO status , Patient's Chart, lab work & pertinent test results, reviewed documented beta blocker date and time   History of Anesthesia Complications Negative for: history of anesthetic complications  Airway Mallampati: III  TM Distance: >3 FB Neck ROM: Full    Dental  (+) Dental Advisory Given, Edentulous Upper, Chipped   Pulmonary COPD, former smoker   Pulmonary exam normal        Cardiovascular hypertension, Pt. on home beta blockers and Pt. on medications pulmonary hypertension+ Peripheral Vascular Disease  + dysrhythmias + Valvular Problems/Murmurs MR  Rhythm:Regular Rate:Normal + Systolic murmurs '25 Carotid US  - 40-59% bilateral ICAS  '25 TTE - EF 60 to 65%. There is moderately elevated pulmonary artery systolic pressure. Left atrial size was moderately dilated. Moderate to severe mitral valve regurgitation. Mild to moderate mitral stenosis. Tricuspid valve regurgitation is mild to moderate.     Neuro/Psych  PSYCHIATRIC DISORDERS  Depression     Neuromuscular disease    GI/Hepatic Neg liver ROS,GERD  Medicated and Controlled,,Anemia and dark stool   Endo/Other  diabetes, Type 2, Insulin  Dependent    Renal/GU ESRF and DialysisRenal disease     Musculoskeletal  (+) Arthritis ,    Abdominal   Peds  Hematology  (+) Blood dyscrasia, anemia   Anesthesia Other Findings   Reproductive/Obstetrics                              Anesthesia Physical Anesthesia Plan  ASA: 4  Anesthesia Plan: MAC   Post-op Pain Management: Minimal or no pain anticipated   Induction:   PONV Risk Score and Plan: 2 and Propofol  infusion and Treatment may vary due to age or medical condition  Airway Management Planned: Natural Airway and Nasal Cannula  Additional Equipment:  None  Intra-op Plan:   Post-operative Plan:   Informed Consent: I have reviewed the patients History and Physical, chart, labs and discussed the procedure including the risks, benefits and alternatives for the proposed anesthesia with the patient or authorized representative who has indicated his/her understanding and acceptance.       Plan Discussed with: CRNA and Anesthesiologist  Anesthesia Plan Comments:         Anesthesia Quick Evaluation

## 2024-03-02 NOTE — H&P (Addendum)
 I am  NAME:  Anna Carr, MRN:  [id number], DOB:  Feb 15, 1941, LOS: 1 ADMISSION DATE:  02/25/2024, CONSULTATION DATE: 03/02/2024 REFERRING MD: Anesthesia, CHIEF COMPLAINT: Status post cardiac arrest  History of Present Illness:  83 year old female with a plethora of health issues that are well-documented below was taken to the endoscopy suite for endoscopic evaluation.  During examination she had a cardiac arrest requiring brief CPR with  broken ribs.  She required intubation surgical placement in the right internal jugular vein and a right radial arterial line.  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  She been transferred to the intensive care unit she is hemodynamically stable.  She notable for a pH of 7.11 this is treated with an increased respiratory rate on the ventilator and 1 amp of sodium bicarbonate.  She has a history of congestive heart failure therefore we will be judicious with IV fluids and she may require cardiac evaluation.  Due to her end-stage renal disease she might require a nephrology consult if she remains in the intensive care unit.  She has adequate blood pressure for hemodialysis if this possible in the critical care unit.  Pertinent  Medical History   Past Medical History:  Diagnosis Date   Anemia, chronic disease 01/23/2017   Arteriovenous fistula, acquired ([name]) 11/02/2019   AVM (arteriovenous malformation)    C. 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 rate (!) 22, height 5\' 1"  (1.549 m), weight 72.8 kg, SpO2 93%.        Intake/Output Summary (Last 24 hours) at 03/02/2024 1107 Last data filed at 03/02/2024 1024 Gross per 24 hour  Intake 700 ml  Output --  Net 700 ml   Filed Weights   02/29/24 0845 02/29/24 1215 03/01/24 0600  Weight: 76.4 kg 74.4 kg 72.8 kg    Examination: General: Elderly female who sedated on full mechanical ventilatory support HENT: Endotracheal tube is in place Lungs: Diminished breath sounds in the bases Cardiovascular: Heart sounds with murmur adequate blood pressure Abdomen: Obese soft nontender Extremities: Bilateral knees with bandages most likely from abrasions from falls.  She has a left biceps AV fistula with positive bruit and thrill Neuro: Sedated on the vent GU: End-stage renal disease  Resolved Hospital Problem list     Assessment & Plan:  Ventilator dependent respiratory failure in the setting of cardiac arrest during endoscopy  procedure on 03/02/2024 requiring brief CPR intubation Central line placement arterial line placement and transferred to the intensive care unit. Continue ventilator support adjustments as needed Serial ABGs Chest x-ray Wean FiO2 as able May be possible to extubate in the near future  Hypotension Low-dose Levophed Wean sedation as able Recheck ABG Recheck CBC with use of levo  sternebra follow-up lower workup no bloody dull-like/  GI bleed Per GI  Congestive heart failure Judicious IV fluids May need cardiology consult  Diabetes Every 4 hours CBGs  End-stage renal disease last dialysis 02/29/2024 Nephrology consult  Best Practice (right click and "Reselect all SmartList Selections" daily)   Diet/type: NPO DVT prophylaxis not indicated Pressure ulcer(s): N/A GI prophylaxis: PPI Lines: Central line and Arterial Line Foley:  Yes, and it is still needed Code Status:  full code Last date of multidisciplinary goals of care discussion [tbd]  Labs   CBC: Recent Labs  Lab 02/27/24 0523 02/28/24 0954 02/28/24 1224 02/29/24 0459 03/01/24 0731 03/02/24 0550 03/02/24 1032  WBC 5.5  --  7.2 6.6 6.2 6.4  --   HGB 9.5*   < > 8.7* 8.7* 8.8* 9.0* 8.2*  HCT 28.9*   < > 27.7* 25.8* 27.4* 27.0* 24.0*  MCV 102.5*  --  104.5* 102.4* 104.6* 102.3*  --   PLT 159  --  149* 149* 157 146*  --    < > = values in this interval not displayed.    Basic Metabolic Panel: Recent Labs  Lab 02/26/24 0544 02/27/24 0523 02/28/24 0954 02/28/24 1224 02/29/24 0459 03/01/24 0731 03/02/24 0550 03/02/24 1032  NA 130* 129* 127* 128* 123* 124* 120* 119*  K 4.3 5.1 4.4 5.1 4.5 4.2 5.6* 5.3*  CL 93* 91* 89* 91* 86* 87* 84*  --   CO2 24 24  --  23 21* 25 23  --   GLUCOSE 76 183* 147* 136* 66* 172* 250*  --   BUN 56* 68* 54* 62* 66* 37* 55*  --   CREATININE 4.76* 6.18* 5.60* 5.57* 5.71* 4.36* 5.39*  --   CALCIUM  8.8* 8.9  --  9.0 8.7* 8.8* 8.8*  --   PHOS 3.6 4.6  --  3.7 3.5 3.4  --   --    GFR: Estimated Creatinine Clearance: 7.3 mL/min (A) (by C-G formula based on SCr of 5.39 mg/dL (H)). Recent Labs  Lab 02/28/24 1224 02/29/24 0459 03/01/24 0731 03/02/24 0550  WBC 7.2 6.6 6.2 6.4    Liver Function Tests: Recent Labs  Lab 02/25/24 1008 02/26/24 0544 02/27/24 0523 02/28/24 1224 02/29/24 0459 03/01/24 0731  AST 32  --   --   --   --   --   ALT 26  --   --    --   --   --   ALKPHOS 65  --   --   --   --   --   BILITOT 1.1  --   --   --   --   --   PROT 5.1*  --   --   --   --   --   ALBUMIN 2.5* 2.5* 2.6* 2.6* 2.5* 2.6*   No results for input(s): "LIPASE", "AMYLASE" in the last 168 hours. No results for input(s): "AMMONIA" in the last 168 hours.  ABG    Component Value Date/Time   PHART 7.187 (LL) 03/02/2024 1032   PCO2ART 44.6 03/02/2024 1032   PO2ART 193 (H) 03/02/2024 1032   HCO3 16.9 (L)  03/02/2024 1032   TCO2 18 (L) 03/02/2024 1032   ACIDBASEDEF 11.0 (H) 03/02/2024 1032   O2SAT 99 03/02/2024 1032     Coagulation Profile: No results for input(s): "INR", "PROTIME" in the last 168 hours.  Cardiac Enzymes: No results for input(s): "CKTOTAL", "CKMB", "CKMBINDEX", "TROPONINI" in the last 168 hours.  HbA1C: Hgb A1c MFr Bld  Date/Time Value Ref Range Status  02/25/2024 03:00 PM 5.6 4.8 - 5.6 % Final    Comment:    (NOTE) Pre diabetes:          5.7%-6.4%  Diabetes:              >6.4%  Glycemic control for   <7.0% adults with diabetes   03/16/2021 12:33 AM 6.9 (H) 4.8 - 5.6 % Final    Comment:    (NOTE) Pre diabetes:          5.7%-6.4%  Diabetes:              >6.4%  Glycemic control for   <7.0% adults with diabetes     CBG: Recent Labs  Lab 03/01/24 0800 03/01/24 1210 03/01/24 1657 03/01/24 2203 03/02/24 0752  GLUCAP 213* 251* 193* 189* 237*    Review of Systems:   na  Past Medical History:  She,  has a past medical history of Anemia, chronic disease (01/23/2017), Arteriovenous fistula, acquired ([name]) (11/02/2019), AVM (arteriovenous malformation), C. difficile colitis (03/30/2021), CHF with left ventricular diastolic dysfunction, NYHA class 1 ([name]) (02/02/2017), Cholelithiasis (03/30/2021), Colon polyp, COPD mixed type ([name]), ESRD (end stage renal disease) ([name]) (07/18/2019), History of blood transfusion, Hyperlipidemia, Hypertension, Hypervolemia (11/13/2019), Hypovolemia (10/27/2021), Insomnia, Major depressive  disorder, recurrent ([name]) (12/22/2015), Malaise and fatigue (12/23/2015), Osteoarthritis, Osteopenia, PAC (premature atrial contraction) (10/04/2020), Pain, unspecified (12/06/2019), Pneumonia, Primary osteoarthritis (12/22/2015), SIADH (syndrome of inappropriate ADH production) ([name]) (03/29/2019), and Type 2 diabetes mellitus, with long-term current use of insulin  ([name]) (03/11/2019).   Surgical History:   Past Surgical History:  Procedure Laterality Date   A/V FISTULAGRAM Left 02/17/2021   Procedure: A/V FISTULAGRAM;  Surgeon: Adine Hoof, MD;  Location: [facility] INVASIVE CV LAB;  Service: Cardiovascular;  Laterality: Left;   APPENDECTOMY     AV FISTULA PLACEMENT Left    AV FISTULA PLACEMENT Left 03/12/2021   Procedure: INSERTION OF LEFT UPPER ARM ARTERIOVENOUS (AV) GORE-TEX GRAFT;  Surgeon: Adine Hoof, MD;  Location: [facility] OR;  Service: Vascular;  Laterality: Left;   COLONOSCOPY WITH PROPOFOL  N/A 03/18/2021   Procedure: COLONOSCOPY WITH PROPOFOL ;  Surgeon: Albertina Hugger, MD;  Location: [facility] ENDOSCOPY;  Service: Gastroenterology;  Laterality: N/A;   CYSTECTOMY     ENTEROSCOPY N/A 03/21/2021   Procedure: ENTEROSCOPY;  Surgeon: Albertina Hugger, MD;  Location: [facility] ENDOSCOPY;  Service: Gastroenterology;  Laterality: N/A;   ENTEROSCOPY N/A 12/20/2021   Procedure: ENTEROSCOPY;  Surgeon: Albertina Hugger, MD;  Location: [facility] ENDOSCOPY;  Service: Gastroenterology;  Laterality: N/A;   ENTEROSCOPY N/A 02/26/2024   Procedure: ENTEROSCOPY;  Surgeon: Nannette Babe, MD;  Location: [facility] ENDOSCOPY;  Service: Gastroenterology;  Laterality: N/A;   ENTEROSCOPY N/A 02/28/2024   Procedure: ENTEROSCOPY;  Surgeon: Annis Kinder, DO;  Location: [facility] ENDOSCOPY;  Service: Gastroenterology;  Laterality: N/A;   ESOPHAGOGASTRODUODENOSCOPY (EGD) WITH PROPOFOL  N/A 03/18/2021   Procedure: ESOPHAGOGASTRODUODENOSCOPY (EGD) WITH PROPOFOL ;  Surgeon: Albertina Hugger, MD;  Location: [facility] ENDOSCOPY;  Service:  Gastroenterology;  Laterality: N/A;   GIVENS CAPSULE STUDY N/A 03/19/2021   Procedure: GIVENS CAPSULE STUDY;  Surgeon: Albertina Hugger, MD;  Location: [facility] ENDOSCOPY;  Service: Gastroenterology;  Laterality: N/A;   GIVENS CAPSULE STUDY N/A 02/29/2024   Procedure: IMAGING PROCEDURE, GI TRACT, INTRALUMINAL, VIA CAPSULE;  Surgeon: Annis Kinder, DO;  Location: [facility] ENDOSCOPY;  Service: Gastroenterology;  Laterality: N/A;   HEMOSTASIS CLIP PLACEMENT  12/20/2021   Procedure: HEMOSTASIS CLIP PLACEMENT;  Surgeon: Albertina Hugger, MD;  Location: [facility] ENDOSCOPY;  Service: Gastroenterology;;   HOT HEMOSTASIS N/A 03/21/2021   Procedure: HOT HEMOSTASIS (ARGON PLASMA COAGULATION/BICAP);  Surgeon: Albertina Hugger, MD;  Location: [facility] ENDOSCOPY;  Service: Gastroenterology;  Laterality: N/A;   HOT HEMOSTASIS N/A 12/20/2021   Procedure: HOT HEMOSTASIS (ARGON PLASMA COAGULATION/BICAP);  Surgeon: Albertina Hugger, MD;  Location: [facility] ENDOSCOPY;  Service: Gastroenterology;  Laterality: N/A;   HYSTEROSCOPY     LOWER EXTREMITY ANGIOGRAPHY N/A 02/03/2024   Procedure: Lower Extremity Angiography;  Surgeon: Avanell Leigh, MD;  Location: [facility] INVASIVE CV LAB;  Service: Cardiovascular;  Laterality: N/A;   POLYPECTOMY  03/18/2021   Procedure: POLYPECTOMY;  Surgeon: Albertina Hugger, MD;  Location: [facility] ENDOSCOPY;  Service: Gastroenterology;;     Social History:   reports that she has quit smoking. Her smoking use included cigarettes. She has never used smokeless tobacco. She reports that she does not currently use alcohol . She reports that she does not use drugs.   Family History:  Her family history includes Diabetes in her maternal grandfather and mother; Hypertension in her mother; Liver disease in an other family member.   Allergies No Known Allergies   Home Medications  Prior to Admission medications   Medication Sig Start Date End Date Taking? Authorizing Provider  acetaminophen  (TYLENOL ) 500 MG tablet  Take 500 mg by mouth every 6 (six) hours as needed for mild pain (pain score 1-3) or moderate pain (pain score 4-6).   Yes [provider]  ascorbic acid (VITAMIN C) 1000 MG tablet Take 1,000 mg by mouth daily in the afternoon. 12/07/19  Yes [provider]  aspirin  EC 81 MG tablet Take 1 tablet (81 mg total) by mouth daily. Swallow whole. 12/01/23  Yes Krasowski, Robert J, MD  atorvastatin  (LIPITOR) 10 MG tablet Take 1 tablet (10 mg total) by mouth daily. Patient taking differently: Take 10 mg by mouth at bedtime. 12/01/23  Yes Krasowski, Robert J, MD  Biotin 5000 MCG TABS Take 5,000 mcg by mouth daily in the afternoon.   Yes [provider]  carvedilol  (COREG ) 6.25 MG tablet Take 6.25 mg by mouth 2 (two) times daily. 12/07/19  Yes [provider]  cyanocobalamin (VITAMIN B12) 1000 MCG tablet Take 1,000 mcg by mouth every evening.   Yes [provider]  ferric citrate  (AURYXIA ) 1 GM 210 MG(Fe) tablet Take 210 mg by mouth 3 (three) times daily with meals.   Yes [provider]  fluticasone  (FLONASE ) 50 MCG/ACT nasal spray Place 1 spray into both nostrils daily as needed (Congestion).   Yes [provider]  furosemide  (LASIX ) 80 MG tablet Take 80 mg by mouth daily. 04/03/21  Yes [provider]  Insulin  Glargine-Lixisenatide  (SOLIQUA ) 100-33 UNT-MCG/ML SOPN Inject 12 Units into the skin in the morning. Patient taking differently: Inject 12 Units into the skin daily at 12 noon. 03/23/21  Yes Samtani, Jai-Gurmukh, MD  Lactobacillus (PROBIOTIC ACIDOPHILUS PO) Take 1 capsule by mouth in the morning.   Yes [provider]  lidocaine -prilocaine  (EMLA ) cream Apply 1 application topically as needed (prior  to fisula access). Tuesday, Thursday, Saturday for dialysis 07/04/20  Yes [provider]  Omega-3 Fatty Acids (FISH OIL PO) Take 2,400 mg by mouth in the morning and at bedtime.   Yes [provider]  pantoprazole   (PROTONIX ) 40 MG tablet Take 40 mg by mouth daily.   Yes [provider]  polyethylene glycol (MIRALAX  / GLYCOLAX ) 17 g packet Take 17 g by mouth daily as needed for moderate constipation.   Yes [provider]  Vitamin E 670 MG (1000 UT) CAPS Take 1,000 Units by mouth in the morning. 01/09/20  Yes [provider]  carboxymethylcellulose (REFRESH PLUS) 0.5 % SOLN Place 1 drop into both eyes 3 (three) times daily as needed (dry eyes).    [provider]  pregabalin (LYRICA) 75 MG capsule Take 75 mg by mouth daily. Patient not taking: Reported on 02/25/2024    [provider]     Critical care time: 60 min     Siegfried Dress Merrell Rettinger ACNP Acute Care Nurse Practitioner [facility] Pulmonary/Critical Care Please consult Amion 03/02/2024, 11:08 AM

## 2024-03-02 NOTE — Progress Notes (Signed)
 Evaluated patient in ICU and discussed her care with the Critical Care Service at bedside.  Pressors have been weaned off.  Very appreciative for CCS assistance in her care.  I discussed her case at length with her daughter at bedside today.  Discussed the procedure findings along with the periprocedural hemodynamic instability and subsequent brief code.  All questions answered to the best of my ability.  Will continue maximum supportive care in the ICU.  Will continue serial CBC checks with blood products as needed per protocol.  No repeat endoscopic procedures in the future.  Inpatient GI service will continue to follow.   Harry Lindau, DO, [facility]

## 2024-03-03 ENCOUNTER — Inpatient Hospital Stay (HOSPITAL_COMMUNITY)

## 2024-03-03 DIAGNOSIS — K922 Gastrointestinal hemorrhage, unspecified: Secondary | ICD-10-CM

## 2024-03-03 DIAGNOSIS — I469 Cardiac arrest, cause unspecified: Secondary | ICD-10-CM | POA: Diagnosis not present

## 2024-03-03 DIAGNOSIS — Z992 Dependence on renal dialysis: Secondary | ICD-10-CM | POA: Diagnosis not present

## 2024-03-03 DIAGNOSIS — K31819 Angiodysplasia of stomach and duodenum without bleeding: Secondary | ICD-10-CM

## 2024-03-03 DIAGNOSIS — R0789 Other chest pain: Secondary | ICD-10-CM

## 2024-03-03 DIAGNOSIS — N179 Acute kidney failure, unspecified: Secondary | ICD-10-CM

## 2024-03-03 DIAGNOSIS — N186 End stage renal disease: Secondary | ICD-10-CM | POA: Diagnosis not present

## 2024-03-03 DIAGNOSIS — R739 Hyperglycemia, unspecified: Secondary | ICD-10-CM

## 2024-03-03 DIAGNOSIS — D62 Acute posthemorrhagic anemia: Secondary | ICD-10-CM | POA: Diagnosis not present

## 2024-03-03 DIAGNOSIS — D696 Thrombocytopenia, unspecified: Secondary | ICD-10-CM

## 2024-03-03 HISTORY — DX: Gastrointestinal hemorrhage, unspecified: K92.2

## 2024-03-03 HISTORY — DX: Angiodysplasia of stomach and duodenum without bleeding: K31.819

## 2024-03-03 HISTORY — DX: Other chest pain: R07.89

## 2024-03-03 LAB — TYPE AND SCREEN
ABO/RH(D): A POS
Antibody Screen: NEGATIVE
Unit division: 0

## 2024-03-03 LAB — CBC
HCT: 28.5 % — ABNORMAL LOW (ref 36.0–46.0)
HCT: 27.1 % — ABNORMAL LOW (ref 36.0–46.0)
HCT: 28.5 % — ABNORMAL LOW (ref 36.0–46.0)
Hemoglobin: 9.9 g/dL — ABNORMAL LOW (ref 12.0–15.0)
Hemoglobin: 9.1 g/dL — ABNORMAL LOW (ref 12.0–15.0)
Hemoglobin: 9.5 g/dL — ABNORMAL LOW (ref 12.0–15.0)
MCH: 33.7 pg (ref 26.0–34.0)
MCH: 32.9 pg (ref 26.0–34.0)
MCH: 33.6 pg (ref 26.0–34.0)
MCHC: 34.7 g/dL (ref 30.0–36.0)
MCHC: 33.6 g/dL (ref 30.0–36.0)
MCHC: 33.3 g/dL (ref 30.0–36.0)
MCV: 96.9 fL (ref 80.0–100.0)
MCV: 97.8 fL (ref 80.0–100.0)
MCV: 100.7 fL — ABNORMAL HIGH (ref 80.0–100.0)
Platelets: 142 10*3/uL — ABNORMAL LOW (ref 150–400)
Platelets: 141 10*3/uL — ABNORMAL LOW (ref 150–400)
Platelets: 165 10*3/uL (ref 150–400)
RBC: 2.94 MIL/uL — ABNORMAL LOW (ref 3.87–5.11)
RBC: 2.77 MIL/uL — ABNORMAL LOW (ref 3.87–5.11)
RBC: 2.83 MIL/uL — ABNORMAL LOW (ref 3.87–5.11)
RDW: 20.7 % — ABNORMAL HIGH (ref 11.5–15.5)
RDW: 21.2 % — ABNORMAL HIGH (ref 11.5–15.5)
RDW: 21.6 % — ABNORMAL HIGH (ref 11.5–15.5)
WBC: 7.4 10*3/uL (ref 4.0–10.5)
WBC: 8 10*3/uL (ref 4.0–10.5)
WBC: 10.4 10*3/uL (ref 4.0–10.5)
nRBC: 0 % (ref 0.0–0.2)
nRBC: 0 % (ref 0.0–0.2)
nRBC: 0 % (ref 0.0–0.2)

## 2024-03-03 LAB — GLUCOSE, CAPILLARY
Glucose-Capillary: 228 mg/dL — ABNORMAL HIGH (ref 70–99)
Glucose-Capillary: 119 mg/dL — ABNORMAL HIGH (ref 70–99)
Glucose-Capillary: 132 mg/dL — ABNORMAL HIGH (ref 70–99)
Glucose-Capillary: 245 mg/dL — ABNORMAL HIGH (ref 70–99)
Glucose-Capillary: 268 mg/dL — ABNORMAL HIGH (ref 70–99)

## 2024-03-03 LAB — RENAL FUNCTION PANEL
Albumin: 2.6 g/dL — ABNORMAL LOW (ref 3.5–5.0)
Anion gap: 12 (ref 5–15)
BUN: 29 mg/dL — ABNORMAL HIGH (ref 8–23)
CO2: 27 mmol/L (ref 22–32)
Calcium: 8.3 mg/dL — ABNORMAL LOW (ref 8.9–10.3)
Chloride: 87 mmol/L — ABNORMAL LOW (ref 98–111)
Creatinine, Ser: 3.5 mg/dL — ABNORMAL HIGH (ref 0.44–1.00)
GFR, Estimated: 13 mL/min — ABNORMAL LOW (ref >60)
Glucose, Bld: 101 mg/dL — ABNORMAL HIGH (ref 70–99)
Phosphorus: 3.6 mg/dL (ref 2.5–4.6)
Potassium: 4.4 mmol/L (ref 3.5–5.1)
Sodium: 126 mmol/L — ABNORMAL LOW (ref 135–145)

## 2024-03-03 LAB — BPAM RBC
Blood Product Expiration Date: 202506062359
ISSUE DATE / TIME: 202505081719
Unit Type and Rh: 6200

## 2024-03-03 MED ORDER — DOCUSATE SODIUM 100 MG PO CAPS
100.0000 mg | ORAL_CAPSULE | Freq: Two times a day (BID) | ORAL | Status: DC
  Administered 2024-03-04: 100 mg via ORAL
  Filled 2024-03-03: qty 1

## 2024-03-03 MED ORDER — ACETAMINOPHEN 500 MG PO TABS
500.0000 mg | ORAL_TABLET | Freq: Four times a day (QID) | ORAL | Status: DC
  Administered 2024-03-03 – 2024-03-04 (×3): 500 mg via ORAL
  Filled 2024-03-03 (×4): qty 1

## 2024-03-03 MED ORDER — PANTOPRAZOLE SODIUM 40 MG IV SOLR
40.0000 mg | INTRAVENOUS | Status: DC
  Administered 2024-03-03: 40 mg via INTRAVENOUS
  Filled 2024-03-03 (×2): qty 10

## 2024-03-03 MED ORDER — LIDOCAINE 5 % EX PTCH
1.0000 | MEDICATED_PATCH | CUTANEOUS | Status: DC
  Administered 2024-03-03 – 2024-03-11 (×9): 1 via TRANSDERMAL
  Filled 2024-03-03 (×10): qty 1

## 2024-03-03 MED ORDER — OXYCODONE HCL 5 MG PO TABS
5.0000 mg | ORAL_TABLET | ORAL | Status: DC | PRN
  Administered 2024-03-04 – 2024-03-05 (×3): 5 mg via ORAL
  Filled 2024-03-03 (×3): qty 1

## 2024-03-03 MED ORDER — POLYETHYLENE GLYCOL 3350 17 G PO PACK
17.0000 g | PACK | Freq: Every day | ORAL | Status: DC
  Administered 2024-03-04 – 2024-03-09 (×5): 17 g via ORAL
  Filled 2024-03-03 (×7): qty 1

## 2024-03-03 MED FILL — Fentanyl Citrate-NaCl 0.9% IV Soln 2.5 MG/250ML: INTRAVENOUS | Qty: 250 | Status: AC

## 2024-03-03 NOTE — TOC Progression Note (Signed)
 Transition of Care [facility]) - Progression Note    Patient Details  Name: Anna Carr MRN: [id number] Date of Birth: Sep 29, 1941  [facility] CM/SW Contact  Jannice Mends, LCSW Phone Number: 03/03/2024, 4:45 PM  Clinical Narrative:    TOC continuing to follow for CIR determination of candidacy.    Expected Discharge Plan: IP Rehab Facility Barriers to Discharge: Continued Medical Work up, English as a second language teacher  Expected Discharge Plan and Services       Living arrangements for the past 2 months: Single Family Home                                       Social Determinants of Health (SDOH) Interventions SDOH Screenings   Food Insecurity: No Food Insecurity (02/26/2024)  Housing: Low Risk  (02/26/2024)  Transportation Needs: No Transportation Needs (02/26/2024)  Utilities: Not At Risk (02/26/2024)  Financial Resource Strain: Low Risk  (10/27/2021)   Received from [facility]  Social Connections: Unknown (02/26/2024)  Stress: No Stress Concern Present (10/27/2021)   Received from [facility]  Tobacco Use: Medium Risk (03/02/2024)    Readmission Risk Interventions     No data to display

## 2024-03-03 NOTE — Evaluation (Signed)
 Physical Therapy Evaluation Patient Details Name: Anna Carr MRN: [id number] DOB: 29-May-1941 Today's Date: 03/03/2024  History of Present Illness  83 yo female who presents 5/2 with a few days of worsening weakness and admitted for concern for UGI bleed. 5/5 small bowel endoscopy. 5/8 endoscopy where pt had cardiac arrest requiring brief CPR with broken ribs. 5/8 admit to ICU. Multiple prior falls. PMH: ESRD on HD TThS, T2DM, nonrheumatic mitral valve regurgitation, HFpEF, advanced PVD, anemia of chronic disease, and hx of duodenal/jejunal angioectasias in 2022.   Clinical Impression  PT re-evaluation this date due to medical condition changes. Pt's mobility was limited this date due to pain in chest with mobility and fear of falling. Pt was able to stand with RW and ModAx2. After marching in place, pt requested to return to the bed. She was able to take steps with CGA x2 for safety with no physical assist needed. She then required ModA/MinA x2 for safety to return to supine. Anticipating pt will progress well with continued mobility and management of pain levels. Pt has 24/7 assist available upon return home. At this time, recommending >3hrs post acute rehab to gain independence with mobility. Pt currently with functional limitations due to the deficits listed below (see PT Problem List). Pt would benefit from acute skilled PT to address functional impairments. Acute PT to follow.         If plan is discharge home, recommend the following: A lot of help with walking and/or transfers;A lot of help with bathing/dressing/bathroom;Assistance with cooking/housework;Assist for transportation;Help with stairs or ramp for entrance   Can travel by private vehicle    No    Equipment Recommendations None recommended by PT  Recommendations for Other Services  Rehab consult    Functional Status Assessment Patient has had a recent decline in their functional status and demonstrates the ability to  make significant improvements in function in a reasonable and predictable amount of time.     Precautions / Restrictions Precautions Precautions: Fall Precaution/Restrictions Comments: rib fxs Restrictions Weight Bearing Restrictions Per Provider Order: No      Mobility  Bed Mobility Overal bed mobility: Needs Assistance Bed Mobility: Rolling, Sit to Sidelying Rolling: Min assist     Sit to sidelying: Mod assist, +2 for safety/equipment, HOB elevated General bed mobility comments: ModAx2 for safety to bring LE's onto EOB, MinA to roll. Pt holding pillow to chest    Transfers Overall transfer level: Needs assistance Equipment used: Rolling walker (2 wheels) Transfers: Sit to/from Stand, Bed to chair/wheelchair/BSC Sit to Stand: Mod assist, +2 safety/equipment, +2 physical assistance   Step pivot transfers: Mod assist, +2 safety/equipment     General transfer comment: Able to push with B UE on recliner handles. ModAx2 for boost-up with pt initially leaning posteriorly. Able to take steps towards bed with CGAx2 for safety. Cues for sequencing and direction    Ambulation/Gait     General Gait Details: pt declined 2/2 pain     Balance Overall balance assessment: Mild deficits observed, not formally tested, History of Falls           Pertinent Vitals/Pain Pain Assessment Pain Assessment: Faces Faces Pain Scale: Hurts whole lot Pain Location: chest with movement Pain Descriptors / Indicators: Aching, Discomfort, Crying Pain Intervention(s): Limited activity within patient's tolerance, Monitored during session, Repositioned    Home Living Family/patient expects to be discharged to:: Private residence Living Arrangements: Children (daughter and son next door) Available Help at Discharge: Family;Available 24 hours/day Type  of Home: House Home Access: Ramped entrance       Home Layout: One level Home Equipment: Rolling Walker (2 wheels);Grab bars -  tub/shower;Wheelchair Financial trader (4 wheels);BSC/3in1;Cane - single point      Prior Function Prior Level of Function : Needs assist;History of Falls (last six months)    Mobility Comments: cane PRN ADLs Comments: able to bath/dress herself; does a little cooking. Does not clean.     Extremity/Trunk Assessment   Upper Extremity Assessment Upper Extremity Assessment: Defer to OT evaluation    Lower Extremity Assessment Lower Extremity Assessment: Generalized weakness (grossly 4+/5, pain in chest with MMT)    Cervical / Trunk Assessment Cervical / Trunk Assessment: Normal  Communication   Communication Communication: Impaired Factors Affecting Communication: Hearing impaired    Cognition Arousal: Alert Behavior During Therapy: WFL for tasks assessed/performed   PT - Cognitive impairments: No apparent impairments    Following commands: Intact       Cueing Cueing Techniques: Verbal cues     General Comments General comments (skin integrity, edema, etc.): Pt's daughter and son present during session. NT present during a portion of session.    Exercises General Exercises - Lower Extremity Hip Flexion/Marching: AROM, Both, 5 reps, Standing   Assessment/Plan    PT Assessment Patient needs continued PT services  PT Problem List Decreased strength;Decreased activity tolerance;Decreased balance;Decreased mobility;Decreased knowledge of use of DME;Cardiopulmonary status limiting activity       PT Treatment Interventions DME instruction;Gait training;Functional mobility training;Therapeutic activities;Therapeutic exercise;Neuromuscular re-education;Balance training;Patient/family education    PT Goals (Current goals can be found in the Care Plan section)  Acute Rehab PT Goals Patient Stated Goal: to need less help when moving PT Goal Formulation: With patient/family Time For Goal Achievement: 03/17/24 Potential to Achieve Goals: Good    Frequency Min 3X/week      Co-evaluation   Reason for Co-Treatment: For patient/therapist safety;To address functional/ADL transfers PT goals addressed during session: Mobility/safety with mobility;Balance;Proper use of DME OT goals addressed during session: ADL's and self-care       AM-PAC PT "6 Clicks" Mobility  Outcome Measure Help needed turning from your back to your side while in a flat bed without using bedrails?: A Lot Help needed moving from lying on your back to sitting on the side of a flat bed without using bedrails?: A Lot Help needed moving to and from a bed to a chair (including a wheelchair)?: Total Help needed standing up from a chair using your arms (e.g., wheelchair or bedside chair)?: Total Help needed to walk in hospital room?: Total Help needed climbing 3-5 steps with a railing? : Total 6 Click Score: 8    End of Session Equipment Utilized During Treatment: Oxygen Activity Tolerance: Patient limited by pain Patient left: in bed;with call bell/phone within reach;with family/visitor present Nurse Communication: Mobility status PT Visit Diagnosis: Unsteadiness on feet (R26.81);Other abnormalities of gait and mobility (R26.89);Muscle weakness (generalized) (M62.81);History of falling (Z91.81);Difficulty in walking, not elsewhere classified (R26.2)    Time: 1136-1208 PT Time Calculation (min) (ACUTE ONLY): 32 min   Charges:   PT Evaluation $PT Re-evaluation: 1 Re-eval   PT General Charges $$ ACUTE PT VISIT: 1 Visit        Orysia Blas, PT, DPT Secure Chat Preferred  Rehab Office [phone]   Alissa April Adela Ades 03/03/2024, 2:14 PM

## 2024-03-03 NOTE — Progress Notes (Signed)
 Received patient in bed Alert and oriented x4 Informed consent signed and in chart. TX duration: 3.5 hr   Patient tolerated HD treatment well.  Alert, without acute distress.  Hand-off given to patient's nurse.   Access used: AVG  Access issues: NONE  Total UF removed:   Medication(s) given: none  Post HD VS: 124/68  Post HD weight: unable to assess     03/03/24 0006  Vitals  Temp (!) 97 F (36.1 C)  Temp Source Oral  BP 124/68  MAP (mmHg) 85  BP Location Right Arm  BP Method Automatic  Patient Position (if appropriate) Lying  Pulse Rate 83  Pulse Rate Source Monitor  ECG Heart Rate 78  Resp 12  Oxygen Therapy  SpO2 100 %  O2 Device Nasal Cannula  O2 Flow Rate (L/min) 3 L/min  Patient Activity (if Appropriate) In bed  Pulse Oximetry Type Continuous  Post Treatment  Dialyzer Clearance Lightly streaked  Hemodialysis Intake (mL) 500 mL  Liters Processed 84  Fluid Removed (mL) 3000 mL (1 unit of blood included)  Tolerated HD Treatment Yes  Post-Hemodialysis Comments pt axo x4; conscious and coherent. no further complaints noted  AVG/AVF Arterial Site Held (minutes) 5 minutes  AVG/AVF Venous Site Held (minutes) 5 minutes  Fistula / Graft Left Upper arm Arteriovenous vein graft  Placement Date/Time: 03/12/21 1254   Orientation: Left  Access Location: Upper arm  Access Type: Arteriovenous vein graft  Site Condition No complications  Fistula / Graft Assessment Present;Thrill;Bruit  Status Deaccessed;Patent  Drainage Description None    Staci Dykes, BSN, RN [facility]

## 2024-03-03 NOTE — Progress Notes (Signed)
 [facility] Progress Note   Subjective:    Extubated yesterday, had 3L UF with HD - tolerated well.  Has rib fx from CPR she reports - painful.  No new issues.    Objective Vitals:   03/03/24 0400 03/03/24 0500 03/03/24 0600 03/03/24 0700  BP: (!) 131/57     Pulse: 80 79 86 76  Resp: 18 20 16 16   Temp: 98.4 F (36.9 C)   98 F (36.7 C)  TempSrc: Axillary   Axillary  SpO2: 100% 100% 100% 100%  Weight:      Height:       Physical Exam General: awake and calm on [place] Heart: RRR, no murmurs, rubs or gallops Lungs: Clear anteriorly Abdomen: Soft, non-distended Extremities: no edema b/l lower extremities Dialysis Access: AVG +t/b  Filed Weights   02/29/24 0845 02/29/24 1215 03/01/24 0600  Weight: 76.4 kg 74.4 kg 72.8 kg    Intake/Output Summary (Last 24 hours) at 03/03/2024 0833 Last data filed at 03/03/2024 0006 Gross per 24 hour  Intake 1078.47 ml  Output 3000 ml  Net -1921.53 ml    Additional Objective Labs: Basic Metabolic Panel: Recent Labs  Lab 03/01/24 0731 03/02/24 0550 03/02/24 1032 03/02/24 1132 03/02/24 1223 03/03/24 0500  NA 124* 120*   < > 121* 119* 126*  K 4.2 5.6*   < > 5.3* 5.7* 4.4  CL 87* 84*  --  88*  --  87*  CO2 25 23  --  18*  --  27  GLUCOSE 172* 250*  --  256*  --  101*  BUN 37* 55*  --  55*  --  29*  CREATININE 4.36* 5.39*  --  5.18*  --  3.50*  CALCIUM  8.8* 8.8*  --  8.6*  --  8.3*  PHOS 3.4  --   --  5.5*  --  3.6   < > = values in this interval not displayed.   Liver Function Tests: Recent Labs  Lab 02/25/24 1008 02/26/24 0544 03/01/24 0731 03/02/24 1132 03/03/24 0500  AST 32  --   --  74*  --   ALT 26  --   --  52*  --   ALKPHOS 65  --   --  57  --   BILITOT 1.1  --   --  1.1  --   PROT 5.1*  --   --  4.8*  --   ALBUMIN  2.5*   < > 2.6* 2.7* 2.6*   < > = values in this interval not displayed.   Recent Labs  Lab 03/02/24 1132  LIPASE 66*  AMYLASE 29   CBC: Recent Labs  Lab 03/01/24 0731  03/02/24 0550 03/02/24 1032 03/02/24 1132 03/02/24 1145 03/02/24 1223 03/03/24 0500  WBC 6.2 6.4  --  6.0 7.4  --  8.0  HGB 8.8* 9.0*   < > 7.0* 9.9* 7.1* 9.1*  HCT 27.4* 27.0*   < > 21.6* 28.5* 21.0* 27.1*  MCV 104.6* 102.3*  --  106.4* 96.9  --  97.8  PLT 157 146*  --  129* 142*  --  141*   < > = values in this interval not displayed.   Blood Culture    Component Value Date/Time   SDES BLOOD RIGHT HAND 03/16/2021 0046   SPECREQUEST  03/16/2021 0046    BOTTLES DRAWN AEROBIC ONLY Blood Culture results may not be optimal due to an inadequate volume of blood received in culture bottles  CULT  03/16/2021 0046    NO GROWTH 5 DAYS Performed at [facility], [address], [place], [place] [address]    REPTSTATUS 03/21/2021 FINAL 03/16/2021 0046    Cardiac Enzymes: No results for input(s): "CKTOTAL", "CKMB", "CKMBINDEX", "TROPONINI" in the last 168 hours. CBG: Recent Labs  Lab 03/01/24 2203 03/02/24 0752 03/02/24 1108 03/02/24 1640 03/02/24 2205  GLUCAP 189* 237* 240* 228* 111*   Iron  Studies: No results for input(s): "IRON ", "TIBC", "TRANSFERRIN", "FERRITIN" in the last 72 hours. Lab Results  Component Value Date   INR 1.4 (H) 03/02/2024   INR 1.0 03/29/2021   Studies/Results: ECHOCARDIOGRAM COMPLETE Result Date: 03/02/2024    ECHOCARDIOGRAM REPORT   Patient Name:   KEEANNA FREISE Date of Exam: 03/02/2024 Medical Rec #:  [id number]         Height:       61.0 in Accession #:    [id number]        Weight:       160.6 lb Date of Birth:  08/13/1941         BSA:          1.721 m Patient Age:    83 years          BP:           143/66 mmHg Patient Gender: F                 HR:           93 bpm. Exam Location:  Inpatient Procedure: 2D Echo, Cardiac Doppler and Color Doppler (Both Spectral and Color            Flow Doppler were utilized during procedure). Indications:    CHF-Acute Systolic  History:        Patient has prior history of Echocardiogram examinations. CHF,                  COPD; Risk Factors:Hypertension.  Sonographer:    Willey Harrier Referring Phys: [id number] Sammie Crigler S MINOR IMPRESSIONS  1. Left ventricular ejection fraction, by estimation, is 50 to 55%. The left ventricle has low normal function. The left ventricle demonstrates regional wall motion abnormalities (see scoring diagram/findings for description). Left ventricular diastolic  parameters are consistent with Grade II diastolic dysfunction (pseudonormalization).  2. Right ventricular systolic function is normal. The right ventricular size is normal.  3. Left atrial size was severely dilated.  4. The mitral valve is normal in structure. Moderate to severe mitral valve regurgitation. Moderate mitral stenosis. The mean mitral valve gradient is 7.5 mmHg with average heart rate of 92 bpm. Moderate mitral annular calcification.  5. Tricuspid valve regurgitation is moderate.  6. The aortic valve is tricuspid. There is mild calcification of the aortic valve. There is mild thickening of the aortic valve. Aortic valve regurgitation is not visualized. No aortic stenosis is present.  7. Pulmonic valve regurgitation is moderate to severe.  8. IVC 2.5 cm. FINDINGS  Left Ventricle: Left ventricular ejection fraction, by estimation, is 50 to 55%. The left ventricle has low normal function. The left ventricle demonstrates regional wall motion abnormalities. The left ventricular internal cavity size was normal in size. There is no left ventricular hypertrophy. Left ventricular diastolic parameters are consistent with Grade II diastolic dysfunction (pseudonormalization).  LV Wall Scoring: The anterior septum is normal. Right Ventricle: The right ventricular size is normal. No increase in right ventricular wall thickness. Right ventricular systolic function is normal. Left Atrium:  Left atrial size was severely dilated. Right Atrium: Right atrial size was normal in size. Pericardium: There is no evidence of pericardial effusion. Mitral Valve:  The mitral valve is normal in structure. There is moderate thickening of the mitral valve leaflet(s). There is moderate calcification of the mitral valve leaflet(s). Moderate mitral annular calcification. Moderate to severe mitral valve regurgitation. Moderate mitral valve stenosis. MV peak gradient, 12.8 mmHg. The mean mitral valve gradient is 7.5 mmHg with average heart rate of 92 bpm. Tricuspid Valve: The tricuspid valve is normal in structure. Tricuspid valve regurgitation is moderate . No evidence of tricuspid stenosis. Aortic Valve: The aortic valve is tricuspid. There is mild calcification of the aortic valve. There is mild thickening of the aortic valve. Aortic valve regurgitation is not visualized. No aortic stenosis is present. Aortic valve peak gradient measures 13.0 mmHg. Pulmonic Valve: The pulmonic valve was normal in structure. Pulmonic valve regurgitation is moderate to severe. No evidence of pulmonic stenosis. Aorta: The aortic root is normal in size and structure. Venous: IVC 2.5 cm. IVC assessment for right atrial pressure unable to be performed due to mechanical ventilation. IAS/Shunts: No atrial level shunt detected by color flow Doppler.  LEFT VENTRICLE PLAX 2D LVIDd:         4.80 cm   Diastology LVIDs:         4.10 cm   LV e' medial:    6.85 cm/s LV PW:         0.80 cm   LV E/e' medial:  24.1 LV IVS:        0.80 cm   LV e' lateral:   7.62 cm/s LVOT diam:     1.90 cm   LV E/e' lateral: 21.7 LV SV:         53 LV SV Index:   31 LVOT Area:     2.84 cm  RIGHT VENTRICLE            IVC RV Basal diam:  3.60 cm    IVC diam: 2.50 cm RV S prime:     8.59 cm/s TAPSE (M-mode): 1.2 cm LEFT ATRIUM              Index        RIGHT ATRIUM           Index LA diam:        4.20 cm  2.44 cm/m   RA Area:     18.30 cm LA Vol (A2C):   105.0 ml 61.03 ml/m  RA Volume:   51.80 ml  30.11 ml/m LA Vol (A4C):   76.5 ml  44.46 ml/m LA Biplane Vol: 90.3 ml  52.48 ml/m  AORTIC VALVE AV Area (Vmax): 1.36 cm AV Vmax:         180.00 cm/s AV Peak Grad:   13.0 mmHg LVOT Vmax:      86.20 cm/s LVOT Vmean:     62.100 cm/s LVOT VTI:       0.188 m  AORTA Ao Root diam: 3.00 cm Ao Asc diam:  3.00 cm MITRAL VALVE                TRICUSPID VALVE MV Area (PHT): 3.81 cm     TR Peak grad:   21.2 mmHg MV Area VTI:   1.64 cm     TR Vmax:        230.00 cm/s MV Peak grad:  12.8 mmHg MV Mean grad:  7.5 mmHg  SHUNTS MV Vmax:       1.79 m/s     Systemic VTI:  0.19 m MV Vmean:      131.5 cm/s   Systemic Diam: 1.90 cm MV Decel Time: 199 msec MV E velocity: 165.00 cm/s MV A velocity: 145.00 cm/s MV E/A ratio:  1.14 Maudine Sos MD Electronically signed by Maudine Sos MD Signature Date/Time: 03/02/2024/4:19:45 PM    Final    DG Abd 1 View Result Date: 03/02/2024 CLINICAL DATA:  Encounter for orogastric tube placement. EXAM: ABDOMEN - 1 VIEW COMPARISON:  CT 03/30/2021 FINDINGS: Tip and side port of the enteric tube below the diaphragm in the stomach. Increased air throughout nondilated small bowel centrally in the abdomen. Extensive aortic atherosclerosis. Gallstones. IMPRESSION: Tip and side port of the enteric tube below the diaphragm in the stomach. Electronically Signed   By: Chadwick Colonel M.D.   On: 03/02/2024 13:18   DG CHEST PORT 1 VIEW Result Date: 03/02/2024 CLINICAL DATA:  [id number] Intubation of airway performed without difficulty [id number]. EXAM: PORTABLE CHEST 1 VIEW COMPARISON:  02/15/2024. FINDINGS: Examination is limited due to multiple external objects predominantly overlying the left hemithorax. There are nonspecific heterogeneous alveolar and interstitial opacities predominantly overlying the left upper mid lung zones, which appear more pronounced than the prior exam. These are nonspecific. There is also new left retrocardiac opacity obscuring the left hemidiaphragm, obscuring the descending thoracic aorta and blunting the left lateral costophrenic angle, which may represent combination of left lung atelectasis and/or  consolidation with pleural effusion. There is also new/increasing blunting of right lateral costophrenic angle, suggesting small right pleural effusion. There is diffuse mild-to-moderate pulmonary vascular congestion. No pneumothorax. Stable cardio-mediastinal silhouette. No acute osseous abnormalities. The soft tissues are within normal limits. An endotracheal tube is seen with its tip approximately 4.3 cm above the carina. And enteric tube is seen coursing below the left hemidiaphragm with its tip and side hole outside the field of view. Left IJ central venous catheter noted with its tip overlying the proximal portion of left brachiocephalic vein. IMPRESSION: 1. New/increasing heterogeneous alveolar and interstitial opacities overlying the left upper to mid lung zones. New/increasing left retrocardiac opacity obscuring the left hemidiaphragm and descending thoracic aorta. These are nonspecific and differential diagnosis includes asymmetric pulmonary edema versus multifocal pneumonia. 2. There is also new/increasing blunting of right lateral costophrenic angle, suggesting small right pleural effusion. 3. Support apparatus, as described above. Electronically Signed   By: Beula Brunswick M.D.   On: 03/02/2024 11:44    Medications:  sodium chloride      fentaNYL  infusion INTRAVENOUS Stopped (03/02/24 1416)   norepinephrine  (LEVOPHED ) Adult infusion Stopped (03/02/24 1213)    sodium chloride    Intravenous Once   Chlorhexidine  Gluconate Cloth  6 each Topical Q0600   docusate  100 mg Per Tube BID   feeding supplement  237 mL Oral BID BM   fentaNYL  (SUBLIMAZE ) injection  25 mcg Intravenous Once   ferric citrate   210 mg Oral TID WC   furosemide   80 mg Oral Daily   insulin  aspart  0-5 Units Subcutaneous QHS   insulin  aspart  0-6 Units Subcutaneous TID WC   polyethylene glycol  17 g Oral Daily   polyethylene glycol  17 g Per Tube Daily   sodium bicarbonate   100 mEq Intravenous Once   sodium bicarbonate   50  mEq Intravenous Once   sodium chloride  flush  3 mL Intravenous Q12H   sodium zirconium cyclosilicate   10 g Oral Once  Dialysis Orders: [facility] 3.5h   B400    70.6kg    LUA AVG   Heparin  none Last OP HD 5/01, post wt 70.9 Comes off 0-2kg over, bp's wnl on HD Mircera 200mcg IV q 2 weeks- last dose 02/24/24  Assessment/Plan: Acute on chronic anemia/ generalized weakness: FOBT+, Hb 6.6 on admission. S/p endoscopy last week but aborted 2nd hypotension and bradycardia. BB held. S/p EGD 5/5 unrevealing results. 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 Had cardiac arrest in duodenoscopy and there are no plans for repeat endoscopic procedures.  S/p cardiac arrest: 2nd hypotension and bradycardia. CPR performed and ROSC obtained. Now extubated and doing well.  Hyponatremia: chronic mild hypoNa high 120s low 130s.  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 Had HD Thurs overnight.  Next sat. HTN:  BP stable Volume: euvolemic after HD overnight Anemia of esrd + ABLA:  s/p 2 units PRBC 5/2. ESA given 02/24/24. Hb 9s Secondary hyperparathyroidism: Cca and phos in range, cont binders ac tid.  IDDM: per pmd   Adrian Alba MD [facility] Pager [phone]

## 2024-03-03 NOTE — Evaluation (Signed)
 Clinical/Bedside Swallow Evaluation Patient Details  Name: Anna Carr MRN: [id number] Date of Birth: 1941/07/28  Today's Date: 03/03/2024 Time: SLP Start Time (ACUTE ONLY): 1211 SLP Stop Time (ACUTE ONLY): 1230 SLP Time Calculation (min) (ACUTE ONLY): 19 min  Past Medical History:  Past Medical History:  Diagnosis Date   Anemia, chronic disease 01/23/2017   Arteriovenous fistula, acquired ([name]) 11/02/2019   AVM (arteriovenous malformation)    C. 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   Type 2 diabetes mellitus, with long-term current use of insulin  ([name]) 03/11/2019   Past Surgical History:  Past Surgical History:  Procedure Laterality Date   A/V FISTULAGRAM Left 02/17/2021   Procedure: A/V FISTULAGRAM;  Surgeon: Adine Hoof, MD;  Location: [facility] INVASIVE CV LAB;  Service: Cardiovascular;  Laterality: Left;   APPENDECTOMY     AV FISTULA PLACEMENT Left    AV FISTULA PLACEMENT Left 03/12/2021   Procedure: INSERTION OF LEFT UPPER ARM ARTERIOVENOUS (AV) GORE-TEX GRAFT;  Surgeon: Adine Hoof, MD;  Location: [facility] OR;  Service: Vascular;  Laterality: Left;   COLONOSCOPY WITH PROPOFOL  N/A 03/18/2021   Procedure: COLONOSCOPY WITH PROPOFOL ;  Surgeon: Albertina Hugger, MD;  Location: [facility] ENDOSCOPY;  Service: Gastroenterology;  Laterality:  N/A;   CYSTECTOMY     ENTEROSCOPY N/A 03/21/2021   Procedure: ENTEROSCOPY;  Surgeon: Albertina Hugger, MD;  Location: [facility] ENDOSCOPY;  Service: Gastroenterology;  Laterality: N/A;   ENTEROSCOPY N/A 12/20/2021   Procedure: ENTEROSCOPY;  Surgeon: Albertina Hugger, MD;  Location: [facility] ENDOSCOPY;  Service: Gastroenterology;  Laterality: N/A;   ENTEROSCOPY N/A 02/26/2024   Procedure: ENTEROSCOPY;  Surgeon: Nannette Babe, MD;  Location: [facility] ENDOSCOPY;  Service: Gastroenterology;  Laterality: N/A;   ENTEROSCOPY N/A 02/28/2024   Procedure: ENTEROSCOPY;  Surgeon: Annis Kinder, DO;  Location: [facility] ENDOSCOPY;  Service: Gastroenterology;  Laterality: N/A;   ESOPHAGOGASTRODUODENOSCOPY (EGD) WITH PROPOFOL  N/A 03/18/2021   Procedure: ESOPHAGOGASTRODUODENOSCOPY (EGD) WITH PROPOFOL ;  Surgeon: Albertina Hugger, MD;  Location: [facility] ENDOSCOPY;  Service: Gastroenterology;  Laterality: N/A;   GIVENS CAPSULE STUDY N/A 03/19/2021   Procedure: GIVENS CAPSULE STUDY;  Surgeon: Albertina Hugger, MD;  Location: [facility] ENDOSCOPY;  Service: Gastroenterology;  Laterality: N/A;   GIVENS CAPSULE STUDY N/A 02/29/2024   Procedure: IMAGING PROCEDURE, GI TRACT, INTRALUMINAL, VIA CAPSULE;  Surgeon: Annis Kinder, DO;  Location: [facility] ENDOSCOPY;  Service: Gastroenterology;  Laterality: N/A;   HEMOSTASIS CLIP PLACEMENT  12/20/2021   Procedure: HEMOSTASIS CLIP PLACEMENT;  Surgeon: Albertina Hugger, MD;  Location: [facility] ENDOSCOPY;  Service: Gastroenterology;;   HOT HEMOSTASIS N/A 03/21/2021   Procedure: HOT HEMOSTASIS (ARGON PLASMA COAGULATION/BICAP);  Surgeon: Albertina Hugger, MD;  Location: [facility] ENDOSCOPY;  Service: Gastroenterology;  Laterality: N/A;   HOT HEMOSTASIS N/A 12/20/2021   Procedure: HOT HEMOSTASIS (  ARGON PLASMA COAGULATION/BICAP);  Surgeon: Albertina Hugger, MD;  Location: [facility] ENDOSCOPY;  Service: Gastroenterology;  Laterality: N/A;   HYSTEROSCOPY     LOWER EXTREMITY ANGIOGRAPHY N/A 02/03/2024   Procedure: Lower Extremity  Angiography;  Surgeon: Avanell Leigh, MD;  Location: [facility] INVASIVE CV LAB;  Service: Cardiovascular;  Laterality: N/A;   POLYPECTOMY  03/18/2021   Procedure: POLYPECTOMY;  Surgeon: Albertina Hugger, MD;  Location: [facility] ENDOSCOPY;  Service: Gastroenterology;;   HPI:  Anna Carr is an 83 yo female presenting to ED 5/2 with progressive weakness. Admitted with acute on chronic anemia. Underwent small bowel enteroscopy 5/8, during which she suffered cardiac arrest with ROSC achieved after brief CPR. Intubated <24 hrs. PMH includes ESRD on HD, T2DM, non-rheumatic mitral valve regurgitation, HFpEF, advanced PVD, anemia of chronic disease, history of duodenal/jejunal angiectasias 2022    Assessment / Plan / Recommendation  Clinical Impression  Pt describes coughing PTA with both solids and liquids. She coughs frequently in the absence of POs but primarily stifles these attempts due to chest pain s/p CPR. Significantly delayed coughing was noted after pt completed the 3 oz water  test. It is difficult to determine the relation of her coughing and PO intake at bedside. Mastication was prolonged and pt was only agreeable to trying simulated Dys 2 textures due to missing dentition. Discussed completing an MBS, with which pt and her family are eager to proceed. In the interim, recommend initiating a diet of Dys 2 solids with thin liquids. SLP will f/u as scheduling allows to complete an MBS. SLP Visit Diagnosis: Dysphagia, unspecified (R13.10)    Aspiration Risk  Mild aspiration risk    Diet Recommendation Dysphagia 2 (Fine chop);Thin liquid    Liquid Administration via: Cup;Straw Medication Administration: Whole meds with puree Supervision: Staff to assist with self feeding;Full supervision/cueing for compensatory strategies Compensations: Slow rate;Small sips/bites Postural Changes: Seated upright at 90 degrees;Remain upright for at least 30 minutes after po intake    Other  Recommendations Oral  Care Recommendations: Oral care BID    Recommendations for follow up therapy are one component of a multi-disciplinary discharge planning process, led by the attending physician.  Recommendations may be updated based on patient status, additional functional criteria and insurance authorization.  Follow up Recommendations Other (comment) (TBA)      Assistance Recommended at Discharge    Functional Status Assessment Patient has had a recent decline in their functional status and demonstrates the ability to make significant improvements in function in a reasonable and predictable amount of time.  Frequency and Duration min 2x/week  2 weeks       Prognosis Prognosis for improved oropharyngeal function: Good      Swallow Study   General HPI: Anna Carr is an 83 yo female presenting to ED 5/2 with progressive weakness. Admitted with acute on chronic anemia. Underwent small bowel enteroscopy 5/8, during which she suffered cardiac arrest with ROSC achieved after brief CPR. Intubated <24 hrs. PMH includes ESRD on HD, T2DM, non-rheumatic mitral valve regurgitation, HFpEF, advanced PVD, anemia of chronic disease, history of duodenal/jejunal angiectasias 2022 Type of Study: Bedside Swallow Evaluation Previous Swallow Assessment: none in chart Diet Prior to this Study: NPO Temperature Spikes Noted: No Respiratory Status: Nasal cannula History of Recent Intubation: Yes Total duration of intubation (days): 1 days Date extubated: 03/02/24 Behavior/Cognition: Alert;Cooperative Oral Cavity Assessment: Within Functional Limits Oral Care Completed by SLP: No Oral Cavity - Dentition: Poor condition;Missing dentition Vision: Functional  for self-feeding Self-Feeding Abilities: Needs assist Patient Positioning: Upright in bed Baseline Vocal Quality: Normal Volitional Cough: Congested Volitional Swallow: Able to elicit    Oral/Motor/Sensory Function Overall Oral Motor/Sensory Function: Within  functional limits   Ice Chips Ice chips: Not tested   Thin Liquid Thin Liquid: Impaired Presentation: Straw Pharyngeal  Phase Impairments: Cough - Delayed    Nectar Thick Nectar Thick Liquid: Not tested   Honey Thick Honey Thick Liquid: Not tested   Puree Puree: Impaired Presentation: Spoon Pharyngeal Phase Impairments: Cough - Delayed   Solid     Solid: Impaired Presentation: Self Fed Oral Phase Impairments: Impaired mastication      Amil Kale, M.A., CCC-SLP Speech Language Pathology, Acute Rehabilitation Services  Secure Chat preferred [phone]  03/03/2024,1:15 PM

## 2024-03-03 NOTE — Progress Notes (Addendum)
 I am  NAME:  Anna Carr, MRN:  [id number], DOB:  [birth year], LOS: 2 ADMISSION DATE:  02/25/2024, CONSULTATION DATE: 03/02/2024 REFERRING MD: Anesthesia, CHIEF COMPLAINT: Status post cardiac arrest  History of Present Illness:  83 year old female with a plethora of health issues that are well-documented below was taken to the endoscopy suite for endoscopic evaluation.  During examination she had a cardiac arrest requiring brief CPR with  broken ribs.  She required intubation surgical placement in the right internal jugular vein and a right radial arterial line.  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  She been transferred to the intensive care unit she is hemodynamically stable.  She notable for a pH of 7.11 this is treated with an increased respiratory rate on the ventilator and 1 amp of sodium bicarbonate .  She went to 2 H and underwent HD with extubation following.  Pertinent  Medical History   Past Medical History:  Diagnosis Date   Anemia, chronic disease 01/23/2017   Arteriovenous fistula, acquired ([name]) 11/02/2019   AVM (arteriovenous malformation)    C. 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 rate 18, height 5\' 1"  (1.549 m), weight 72.8 kg, SpO2 99%.    Vent Mode: PSV;CPAP FiO2 (%):  [50 %-100 %] 50 % Set Rate:  [20 bmp] 20 bmp Vt Set:  [380 mL] 380 mL PEEP:  [5 cmH20] 5 cmH20 Pressure Support:  [8 cmH20] 8 cmH20 Plateau Pressure:  [21 cmH20] 21 cmH20   Intake/Output Summary (Last 24 hours) at 03/03/2024 0858 Last data filed at 03/03/2024 0006 Gross per 24 hour  Intake 1078.47 ml  Output 3000 ml  Net -1921.53 ml   Filed Weights   02/29/24 0845 02/29/24 1215 03/01/24 0600  Weight: 76.4 kg 74.4 kg 72.8 kg    Examination: General: Elderly female sitting up in bed Lungs: Scattered rhonchi throughout Cardiovascular: Heart sounds with murmur adequate blood pressure Abdomen: Obese soft nontender Extremities: Bilateral knees with bandages most likely from abrasions from falls.  She has a left biceps AV fistula with positive bruit and thrill Neuro: Alert and oriented, no focal deficits GU: End-stage renal disease  Resolved Hospital Problem list     Assessment & Plan:  Acute hypoxic respiratory failure in the setting of cardiac arrest during endoscopy  procedure on 03/02/2024 requiring brief CPR intubation, improving  - Extubated overnight and oxygenating well on 3 L [place] - Pulmonary hygiene protocol - SLP to evaluate today - At risk for aspiration during events yesterday, if uptrending oxygen requirements, development of fever or wbc will consider addition of abx coverage.   Hypotension, resolved - Post arrest vasodilation likely. 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 5/8 returned to endoscopy for small bowel enteroscopy but post intubation had a brief arrest and then ongoing hypoxia despite intubation and procedure aborted.  The non bleeding angiectasis were treated with argon plasma coagulation.  - CBC q 8 hours - PPI  Chronic HFpEF No further IVF resuscitation, normal hemodynamics.   Diabetes Type II - SSI AC/HS with mealtime insulin   End-stage renal disease - LUE fistula. HD TTS - Nephrology following - Plan for HD tomorrow per nephrology , continued home PO lasix   Hyponatremia, acute on chronic - Improved with HD overnight - Baseline sodium 127-low 130's  CCT: 40 Best Practice (right click and "Reselect all SmartList Selections" daily)   Diet/type: NPO Pending SLP eval DVT prophylaxis other contraindicated due to concern for GIB with transfusion 5/8 Pressure ulcer(s): N/A GI prophylaxis: PPI Lines: Central line and Arterial Line - d/c aline and attempt PIVs Foley:  Yes, and it is still needed Code Status:  full code Last date of multidisciplinary goals of care discussion [Daughter updated at bedside] Plan to re-evaluate this afternoon, may be a candidate for transfer if continues to be stable.   Labs   CBC: Recent Labs  Lab 03/01/24 0731 03/02/24 0550 03/02/24 1032 03/02/24 1107 03/02/24 1132 03/02/24 1145 03/02/24 1223 03/03/24 0500  WBC 6.2 6.4  --   --  6.0 7.4  --  8.0  HGB 8.8* 9.0*   < > 6.8* 7.0* 9.9* 7.1* 9.1*  HCT 27.4* 27.0*   < > 20.0* 21.6* 28.5* 21.0* 27.1*  MCV 104.6* 102.3*  --   --  106.4* 96.9  --  97.8  PLT 157 146*  --   --  129* 142*  --  141*   < > = values in this interval not displayed.    Basic Metabolic Panel: Recent Labs  Lab 02/28/24 1224 02/29/24 0459 03/01/24 0731 03/02/24 0550 03/02/24 1032 03/02/24 1107 03/02/24 1132 03/02/24 1223  03/03/24 0500  NA 128* 123* 124* 120* 119* 118* 121* 119* 126*  K 5.1 4.5 4.2 5.6* 5.3* 5.1 5.3* 5.7* 4.4  CL 91* 86* 87* 84*  --   --  88*  --  87*  CO2 23 21* 25 23  --   --  18*  --  27  GLUCOSE 136* 66* 172* 250*  --   --  256*  --  101*  BUN 62* 66* 37* 55*  --   --  55*  --  29*  CREATININE 5.57* 5.71* 4.36* 5.39*  --   --  5.18*  --  3.50*  CALCIUM  9.0 8.7* 8.8* 8.8*  --   --  8.6*  --  8.3*  MG  --   --   --   --   --   --  1.9  --   --   PHOS 3.7 3.5 3.4  --   --   --  5.5*  --  3.6   GFR: Estimated Creatinine Clearance: 11.3 mL/min (  A) (by C-G formula based on SCr of 3.5 mg/dL (H)). Recent Labs  Lab 03/02/24 0550 03/02/24 1132 03/02/24 1145 03/03/24 0500  WBC 6.4 6.0 7.4 8.0    Liver Function Tests: Recent Labs  Lab 02/25/24 1008 02/26/24 0544 02/28/24 1224 02/29/24 0459 03/01/24 0731 03/02/24 1132 03/03/24 0500  AST 32  --   --   --   --  74*  --   ALT 26  --   --   --   --  52*  --   ALKPHOS 65  --   --   --   --  57  --   BILITOT 1.1  --   --   --   --  1.1  --   PROT 5.1*  --   --   --   --  4.8*  --   ALBUMIN  2.5*   < > 2.6* 2.5* 2.6* 2.7* 2.6*   < > = values in this interval not displayed.   Recent Labs  Lab 03/02/24 1132  LIPASE 66*  AMYLASE 29   No results for input(s): "AMMONIA" in the last 168 hours.  ABG    Component Value Date/Time   PHART 7.315 (L) 03/02/2024 1223   PCO2ART 44.1 03/02/2024 1223   PO2ART 395 (H) 03/02/2024 1223   HCO3 22.5 03/02/2024 1223   TCO2 24 03/02/2024 1223   ACIDBASEDEF 3.0 (H) 03/02/2024 1223   O2SAT 100 03/02/2024 1223     Coagulation Profile: Recent Labs  Lab 03/02/24 1132  INR 1.4*    Cardiac Enzymes: No results for input(s): "CKTOTAL", "CKMB", "CKMBINDEX", "TROPONINI" in the last 168 hours.  HbA1C: Hgb A1c MFr Bld  Date/Time Value Ref Range Status  02/25/2024 03:00 PM 5.6 4.8 - 5.6 % Final    Comment:    (NOTE) Pre diabetes:          5.7%-6.4%  Diabetes:              >6.4%  Glycemic  control for   <7.0% adults with diabetes   03/16/2021 12:33 AM 6.9 (H) 4.8 - 5.6 % Final    Comment:    (NOTE) Pre diabetes:          5.7%-6.4%  Diabetes:              >6.4%  Glycemic control for   <7.0% adults with diabetes     CBG: Recent Labs  Lab 03/01/24 2203 03/02/24 0752 03/02/24 1108 03/02/24 1640 03/02/24 2205  GLUCAP 189* 237* 240* 228* 111*    Review of Systems:   na  Past Medical History:  She,  has a past medical history of Anemia, chronic disease (01/23/2017), Arteriovenous fistula, acquired ([name]) (11/02/2019), AVM (arteriovenous malformation), C. difficile colitis (03/30/2021), CHF with left ventricular diastolic dysfunction, NYHA class 1 ([name]) (02/02/2017), Cholelithiasis (03/30/2021), Colon polyp, COPD mixed type ([name]), ESRD (end stage renal disease) ([name]) (07/18/2019), History of blood transfusion, Hyperlipidemia, Hypertension, Hypervolemia (11/13/2019), Hypovolemia (10/27/2021), Insomnia, Major depressive disorder, recurrent ([name]) (12/22/2015), Malaise and fatigue (12/23/2015), Osteoarthritis, Osteopenia, PAC (premature atrial contraction) (10/04/2020), Pain, unspecified (12/06/2019), Pneumonia, Primary osteoarthritis (12/22/2015), SIADH (syndrome of inappropriate ADH production) ([name]) (03/29/2019), and Type 2 diabetes mellitus, with long-term current use of insulin  ([name]) (03/11/2019).   Surgical History:   Past Surgical History:  Procedure Laterality Date   A/V FISTULAGRAM Left 02/17/2021   Procedure: A/V FISTULAGRAM;  Surgeon: Adine Hoof, MD;  Location[facility] INVASIVE CV LAB;  Service: Cardiovascular;  Laterality: Left;  APPENDECTOMY     AV FISTULA PLACEMENT Left    AV FISTULA PLACEMENT Left 03/12/2021   Procedure: INSERTION OF LEFT UPPER ARM ARTERIOVENOUS (AV) GORE-TEX GRAFT;  Surgeon: Adine Hoof, MD;  Location: [facility] OR;  Service: Vascular;  Laterality: Left;   COLONOSCOPY WITH PROPOFOL  N/A 03/18/2021   Procedure: COLONOSCOPY WITH  PROPOFOL ;  Surgeon: Albertina Hugger, MD;  Location: [facility] ENDOSCOPY;  Service: Gastroenterology;  Laterality: N/A;   CYSTECTOMY     ENTEROSCOPY N/A 03/21/2021   Procedure: ENTEROSCOPY;  Surgeon: Albertina Hugger, MD;  Location: [facility] ENDOSCOPY;  Service: Gastroenterology;  Laterality: N/A;   ENTEROSCOPY N/A 12/20/2021   Procedure: ENTEROSCOPY;  Surgeon: Albertina Hugger, MD;  Location: [facility] ENDOSCOPY;  Service: Gastroenterology;  Laterality: N/A;   ENTEROSCOPY N/A 02/26/2024   Procedure: ENTEROSCOPY;  Surgeon: Nannette Babe, MD;  Location: [facility] ENDOSCOPY;  Service: Gastroenterology;  Laterality: N/A;   ENTEROSCOPY N/A 02/28/2024   Procedure: ENTEROSCOPY;  Surgeon: Annis Kinder, DO;  Location: [facility] ENDOSCOPY;  Service: Gastroenterology;  Laterality: N/A;   ESOPHAGOGASTRODUODENOSCOPY (EGD) WITH PROPOFOL  N/A 03/18/2021   Procedure: ESOPHAGOGASTRODUODENOSCOPY (EGD) WITH PROPOFOL ;  Surgeon: Albertina Hugger, MD;  Location: [facility] ENDOSCOPY;  Service: Gastroenterology;  Laterality: N/A;   GIVENS CAPSULE STUDY N/A 03/19/2021   Procedure: GIVENS CAPSULE STUDY;  Surgeon: Albertina Hugger, MD;  Location: [facility] ENDOSCOPY;  Service: Gastroenterology;  Laterality: N/A;   GIVENS CAPSULE STUDY N/A 02/29/2024   Procedure: IMAGING PROCEDURE, GI TRACT, INTRALUMINAL, VIA CAPSULE;  Surgeon: Annis Kinder, DO;  Location: [facility] ENDOSCOPY;  Service: Gastroenterology;  Laterality: N/A;   HEMOSTASIS CLIP PLACEMENT  12/20/2021   Procedure: HEMOSTASIS CLIP PLACEMENT;  Surgeon: Albertina Hugger, MD;  Location: [facility] ENDOSCOPY;  Service: Gastroenterology;;   HOT HEMOSTASIS N/A 03/21/2021   Procedure: HOT HEMOSTASIS (ARGON PLASMA COAGULATION/BICAP);  Surgeon: Albertina Hugger, MD;  Location: [facility] ENDOSCOPY;  Service: Gastroenterology;  Laterality: N/A;   HOT HEMOSTASIS N/A 12/20/2021   Procedure: HOT HEMOSTASIS (ARGON PLASMA COAGULATION/BICAP);  Surgeon: Albertina Hugger, MD;  Location: [facility] ENDOSCOPY;  Service: Gastroenterology;   Laterality: N/A;   HYSTEROSCOPY     LOWER EXTREMITY ANGIOGRAPHY N/A 02/03/2024   Procedure: Lower Extremity Angiography;  Surgeon: Avanell Leigh, MD;  Location: [facility] INVASIVE CV LAB;  Service: Cardiovascular;  Laterality: N/A;   POLYPECTOMY  03/18/2021   Procedure: POLYPECTOMY;  Surgeon: Albertina Hugger, MD;  Location: [facility] ENDOSCOPY;  Service: Gastroenterology;;     Social History:   reports that she has quit smoking. Her smoking use included cigarettes. She has never used smokeless tobacco. She reports that she does not currently use alcohol . She reports that she does not use drugs.   Family History:  Her family history includes Diabetes in her maternal grandfather and mother; Hypertension in her mother; Liver disease in an other family member.   Allergies No Known Allergies   Home Medications  Prior to Admission medications   Medication Sig Start Date End Date Taking? Authorizing Provider  acetaminophen  (TYLENOL ) 500 MG tablet Take 500 mg by mouth every 6 (six) hours as needed for mild pain (pain score 1-3) or moderate pain (pain score 4-6).   Yes [provider]  ascorbic acid (VITAMIN C) 1000 MG tablet Take 1,000 mg by mouth daily in the afternoon. 12/07/19  Yes [provider]  aspirin  EC 81 MG tablet Take 1 tablet (81 mg total) by mouth daily. Swallow whole. 12/01/23  Yes  Krasowski, Robert J, MD  atorvastatin  (LIPITOR) 10 MG tablet Take 1 tablet (10 mg total) by mouth daily. Patient taking differently: Take 10 mg by mouth at bedtime. 12/01/23  Yes Krasowski, Robert J, MD  Biotin 5000 MCG TABS Take 5,000 mcg by mouth daily in the afternoon.   Yes [provider]  carvedilol  (COREG ) 6.25 MG tablet Take 6.25 mg by mouth 2 (two) times daily. 12/07/19  Yes [provider]  cyanocobalamin (VITAMIN B12) 1000 MCG tablet Take 1,000 mcg by mouth every evening.   Yes [provider]  ferric citrate  (AURYXIA ) 1 GM 210 MG(Fe) tablet Take 210 mg by mouth  3 (three) times daily with meals.   Yes [provider]  fluticasone  (FLONASE ) 50 MCG/ACT nasal spray Place 1 spray into both nostrils daily as needed (Congestion).   Yes [provider]  furosemide  (LASIX ) 80 MG tablet Take 80 mg by mouth daily. 04/03/21  Yes [provider]  Insulin  Glargine-Lixisenatide  (SOLIQUA ) 100-33 UNT-MCG/ML SOPN Inject 12 Units into the skin in the morning. Patient taking differently: Inject 12 Units into the skin daily at 12 noon. 03/23/21  Yes Samtani, Jai-Gurmukh, MD  Lactobacillus (PROBIOTIC ACIDOPHILUS PO) Take 1 capsule by mouth in the morning.   Yes [provider]  lidocaine -prilocaine  (EMLA ) cream Apply 1 application topically as needed (prior to fisula access). Tuesday, Thursday, Saturday for dialysis 07/04/20  Yes [provider]  Omega-3 Fatty Acids (FISH OIL PO) Take 2,400 mg by mouth in the morning and at bedtime.   Yes [provider]  pantoprazole  (PROTONIX ) 40 MG tablet Take 40 mg by mouth daily.   Yes [provider]  polyethylene glycol (MIRALAX  / GLYCOLAX ) 17 g packet Take 17 g by mouth daily as needed for moderate constipation.   Yes [provider]  Vitamin E 670 MG (1000 UT) CAPS Take 1,000 Units by mouth in the morning. 01/09/20  Yes [provider]  carboxymethylcellulose (REFRESH PLUS) 0.5 % SOLN Place 1 drop into both eyes 3 (three) times daily as needed (dry eyes).    [provider]  pregabalin (LYRICA) 75 MG capsule Take 75 mg by mouth daily. Patient not taking: Reported on 02/25/2024    [provider]     Critical care time: 60 min     Siegfried Dress Minor ACNP Acute Care Nurse Practitioner [facility] Pulmonary/Critical Care Please consult Amion 03/03/2024, 8:58 AM

## 2024-03-03 NOTE — Progress Notes (Signed)
 [facility] ROUNDING NOTE   Subjective: No recurrent bleeding overnight.  H/H stable at 9.1/27.  Does have chest pain from rib fractures from CPR.   Objective: Vital signs in last 24 hours: Temp:  [92.5 F (33.6 C)-98.4 F (36.9 C)] 98.2 F (36.8 C) (05/09 1130) Pulse Rate:  [55-94] 78 (05/09 0800) Resp:  [12-30] 18 (05/09 0800) BP: (104-160)/(44-87) 123/73 (05/09 0800) SpO2:  [83 %-100 %] 99 % (05/09 0800) Arterial Line BP: (115-189)/(41-131) 126/90 (05/09 0800) FiO2 (%):  [50 %] 50 % (05/08 1245) Last BM Date : 02/29/24 General: NAD Abdomen:  Soft, NT, ND    Intake/Output from previous day: 05/08 0701 - 05/09 0700 In: 1078.5 [I.V.:218.5; Blood:360; IV Piggyback:500] Out: 3000  Intake/Output this shift: Total I/O In: 240 [P.O.:240] Out: -    Lab Results: Recent Labs    03/02/24 1132 03/02/24 1145 03/02/24 1223 03/03/24 0500  WBC 6.0 7.4  --  8.0  HGB 7.0* 9.9* 7.1* 9.1*  PLT 129* 142*  --  141*  MCV 106.4* 96.9  --  97.8   BMET Recent Labs    03/02/24 0550 03/02/24 1032 03/02/24 1132 03/02/24 1223 03/03/24 0500  NA 120*   < > 121* 119* 126*  K 5.6*   < > 5.3* 5.7* 4.4  CL 84*  --  88*  --  87*  CO2 23  --  18*  --  27  GLUCOSE 250*  --  256*  --  101*  BUN 55*  --  55*  --  29*  CREATININE 5.39*  --  5.18*  --  3.50*  CALCIUM  8.8*  --  8.6*  --  8.3*   < > = values in this interval not displayed.   LFT Recent Labs    03/01/24 0731 03/02/24 1132 03/03/24 0500  PROT  --  4.8*  --   ALBUMIN  2.6* 2.7* 2.6*  AST  --  74*  --   ALT  --  52*  --   ALKPHOS  --  57  --   BILITOT  --  1.1  --    PT/INR Recent Labs    03/02/24 1132  INR 1.4*      Imaging/Other results: DG Chest Port 1 View Result Date: 03/03/2024 CLINICAL DATA:  Weakness, anemia. EXAM: PORTABLE CHEST 1 VIEW COMPARISON:  Mar 02, 2024. FINDINGS: Stable cardiomediastinal silhouette. Mild central pulmonary vascular congestion is noted. Bibasilar edema or  atelectasis is noted with associated effusions. Endotracheal and nasogastric tubes have been removed. Left internal jugular catheter is unchanged. IMPRESSION: Mild central pulmonary vascular congestion with bibasilar edema or atelectasis with associated pleural effusions. Electronically Signed   By: Rosalene Colon M.D.   On: 03/03/2024 10:43   ECHOCARDIOGRAM COMPLETE Result Date: 03/02/2024    ECHOCARDIOGRAM REPORT   Patient Name:   Anna Carr Date of Exam: 03/02/2024 Medical Rec #:  [id number]         Height:       61.0 in Accession #:    [id number]        Weight:       160.6 lb Date of Birth:  11/26/1940         BSA:          1.721 m Patient Age:    83 years          BP:           143/66 mmHg Patient Gender: F  HR:           93 bpm. Exam Location:  Inpatient Procedure: 2D Echo, Cardiac Doppler and Color Doppler (Both Spectral and Color            Flow Doppler were utilized during procedure). Indications:    CHF-Acute Systolic  History:        Patient has prior history of Echocardiogram examinations. CHF,                 COPD; Risk Factors:Hypertension.  Sonographer:    Willey Harrier Referring Phys: [id number] Sammie Crigler S MINOR IMPRESSIONS  1. Left ventricular ejection fraction, by estimation, is 50 to 55%. The left ventricle has low normal function. The left ventricle demonstrates regional wall motion abnormalities (see scoring diagram/findings for description). Left ventricular diastolic  parameters are consistent with Grade II diastolic dysfunction (pseudonormalization).  2. Right ventricular systolic function is normal. The right ventricular size is normal.  3. Left atrial size was severely dilated.  4. The mitral valve is normal in structure. Moderate to severe mitral valve regurgitation. Moderate mitral stenosis. The mean mitral valve gradient is 7.5 mmHg with average heart rate of 92 bpm. Moderate mitral annular calcification.  5. Tricuspid valve regurgitation is moderate.  6. The aortic valve  is tricuspid. There is mild calcification of the aortic valve. There is mild thickening of the aortic valve. Aortic valve regurgitation is not visualized. No aortic stenosis is present.  7. Pulmonic valve regurgitation is moderate to severe.  8. IVC 2.5 cm. FINDINGS  Left Ventricle: Left ventricular ejection fraction, by estimation, is 50 to 55%. The left ventricle has low normal function. The left ventricle demonstrates regional wall motion abnormalities. The left ventricular internal cavity size was normal in size. There is no left ventricular hypertrophy. Left ventricular diastolic parameters are consistent with Grade II diastolic dysfunction (pseudonormalization).  LV Wall Scoring: The anterior septum is normal. Right Ventricle: The right ventricular size is normal. No increase in right ventricular wall thickness. Right ventricular systolic function is normal. Left Atrium: Left atrial size was severely dilated. Right Atrium: Right atrial size was normal in size. Pericardium: There is no evidence of pericardial effusion. Mitral Valve: The mitral valve is normal in structure. There is moderate thickening of the mitral valve leaflet(s). There is moderate calcification of the mitral valve leaflet(s). Moderate mitral annular calcification. Moderate to severe mitral valve regurgitation. Moderate mitral valve stenosis. MV peak gradient, 12.8 mmHg. The mean mitral valve gradient is 7.5 mmHg with average heart rate of 92 bpm. Tricuspid Valve: The tricuspid valve is normal in structure. Tricuspid valve regurgitation is moderate . No evidence of tricuspid stenosis. Aortic Valve: The aortic valve is tricuspid. There is mild calcification of the aortic valve. There is mild thickening of the aortic valve. Aortic valve regurgitation is not visualized. No aortic stenosis is present. Aortic valve peak gradient measures 13.0 mmHg. Pulmonic Valve: The pulmonic valve was normal in structure. Pulmonic valve regurgitation is moderate  to severe. No evidence of pulmonic stenosis. Aorta: The aortic root is normal in size and structure. Venous: IVC 2.5 cm. IVC assessment for right atrial pressure unable to be performed due to mechanical ventilation. IAS/Shunts: No atrial level shunt detected by color flow Doppler.  LEFT VENTRICLE PLAX 2D LVIDd:         4.80 cm   Diastology LVIDs:         4.10 cm   LV e' medial:    6.85 cm/s LV PW:  0.80 cm   LV E/e' medial:  24.1 LV IVS:        0.80 cm   LV e' lateral:   7.62 cm/s LVOT diam:     1.90 cm   LV E/e' lateral: 21.7 LV SV:         53 LV SV Index:   31 LVOT Area:     2.84 cm  RIGHT VENTRICLE            IVC RV Basal diam:  3.60 cm    IVC diam: 2.50 cm RV S prime:     8.59 cm/s TAPSE (M-mode): 1.2 cm LEFT ATRIUM              Index        RIGHT ATRIUM           Index LA diam:        4.20 cm  2.44 cm/m   RA Area:     18.30 cm LA Vol (A2C):   105.0 ml 61.03 ml/m  RA Volume:   51.80 ml  30.11 ml/m LA Vol (A4C):   76.5 ml  44.46 ml/m LA Biplane Vol: 90.3 ml  52.48 ml/m  AORTIC VALVE AV Area (Vmax): 1.36 cm AV Vmax:        180.00 cm/s AV Peak Grad:   13.0 mmHg LVOT Vmax:      86.20 cm/s LVOT Vmean:     62.100 cm/s LVOT VTI:       0.188 m  AORTA Ao Root diam: 3.00 cm Ao Asc diam:  3.00 cm MITRAL VALVE                TRICUSPID VALVE MV Area (PHT): 3.81 cm     TR Peak grad:   21.2 mmHg MV Area VTI:   1.64 cm     TR Vmax:        230.00 cm/s MV Peak grad:  12.8 mmHg MV Mean grad:  7.5 mmHg     SHUNTS MV Vmax:       1.79 m/s     Systemic VTI:  0.19 m MV Vmean:      131.5 cm/s   Systemic Diam: 1.90 cm MV Decel Time: 199 msec MV E velocity: 165.00 cm/s MV A velocity: 145.00 cm/s MV E/A ratio:  1.14 Maudine Sos MD Electronically signed by Maudine Sos MD Signature Date/Time: 03/02/2024/4:19:45 PM    Final    DG Abd 1 View Result Date: 03/02/2024 CLINICAL DATA:  Encounter for orogastric tube placement. EXAM: ABDOMEN - 1 VIEW COMPARISON:  CT 03/30/2021 FINDINGS: Tip and side port of the enteric tube  below the diaphragm in the stomach. Increased air throughout nondilated small bowel centrally in the abdomen. Extensive aortic atherosclerosis. Gallstones. IMPRESSION: Tip and side port of the enteric tube below the diaphragm in the stomach. Electronically Signed   By: Chadwick Colonel M.D.   On: 03/02/2024 13:18   DG CHEST PORT 1 VIEW Result Date: 03/02/2024 CLINICAL DATA:  [id number] Intubation of airway performed without difficulty [id number]. EXAM: PORTABLE CHEST 1 VIEW COMPARISON:  02/15/2024. FINDINGS: Examination is limited due to multiple external objects predominantly overlying the left hemithorax. There are nonspecific heterogeneous alveolar and interstitial opacities predominantly overlying the left upper mid lung zones, which appear more pronounced than the prior exam. These are nonspecific. There is also new left retrocardiac opacity obscuring the left hemidiaphragm, obscuring the descending thoracic aorta and blunting the left lateral costophrenic angle, which may represent combination  of left lung atelectasis and/or consolidation with pleural effusion. There is also new/increasing blunting of right lateral costophrenic angle, suggesting small right pleural effusion. There is diffuse mild-to-moderate pulmonary vascular congestion. No pneumothorax. Stable cardio-mediastinal silhouette. No acute osseous abnormalities. The soft tissues are within normal limits. An endotracheal tube is seen with its tip approximately 4.3 cm above the carina. And enteric tube is seen coursing below the left hemidiaphragm with its tip and side hole outside the field of view. Left IJ central venous catheter noted with its tip overlying the proximal portion of left brachiocephalic vein. IMPRESSION: 1. New/increasing heterogeneous alveolar and interstitial opacities overlying the left upper to mid lung zones. New/increasing left retrocardiac opacity obscuring the left hemidiaphragm and descending thoracic aorta. These are  nonspecific and differential diagnosis includes asymmetric pulmonary edema versus multifocal pneumonia. 2. There is also new/increasing blunting of right lateral costophrenic angle, suggesting small right pleural effusion. 3. Support apparatus, as described above. Electronically Signed   By: Beula Brunswick M.D.   On: 03/02/2024 11:44      Assessment and Plan:  1) Acute on chronic anemia 2) Small bowel AVMs Has now undergone push enteroscopy x 2, VCE (blood in the mid small bowel), followed by single balloon enteroscopy x 1 (although balloon apparatus never inflated during the procedure) during this admission for presumed anemia 2/2 small bowel AVMs.  Given bradycardia and arrest on most recent endoscopic procedure, I recommend against all future endoscopic procedures for this patient.  Any recurrent bleed should be treated supportively.  I discussed that with the patient and her daughter at bedside again today and they are in agreement. - Serial CBC checks with blood products as needed per protocol  3) Cardiac arrest Arrest immediately after most recent endoscopy with immediate CPR and ROSC.  Now extubated and hemodynamically stable.  Off all pressors.  4) ESRD - HD per Nephrology  Inpatient GI service will follow peripherally at this juncture.  Please do not hesitate to contact us  with additional questions or concerns.    Annis Kinder, DO  03/03/2024, 12:30 PM [facility] Pager [phone]

## 2024-03-03 NOTE — Progress Notes (Signed)
 This patient was admitted to the Internal Medicine Teaching Service, but was transferred to the ICU yesterday after she was taken to the endoscopy suite and had a brief cardiac arrest and was subsequently intubated. The patient is now extubated and stable to transfer back to the floors.   IMTS will assume care tomorrow, 5/10, at 7 am.   Pearlina Friedly, DO Internal Medicine Residenty, PGY-3

## 2024-03-03 NOTE — Progress Notes (Signed)
 Inpatient Rehab Admissions Coordinator Note:   Per therapy recommendations patient was screened for CIR candidacy by Mickey Alar, PT. At this time, pt appears to be a potential candidate for CIR. I will place an order for rehab consult for full assessment, per our protocol.  Please contact me any with questions.Loye Rumble, PT, DPT [phone] 03/03/24 2:54 PM

## 2024-03-03 NOTE — Evaluation (Signed)
 Occupational Therapy Re-Evaluation Patient Details Name: Anna Carr MRN: [id number] DOB: Apr 23, 1941 Today's Date: 03/03/2024   History of Present Illness   83 yo female who presents 5/2 with a few days of worsening weakness and admitted for concern for UGI bleed. 5/5 small bowel endoscopy. 5/8 endoscopy where pt had cardiac arrest requiring brief CPR with broken ribs. 5/8 admit to ICU. Multiple prior falls. PMH: ESRD on HD TThS, T2DM, nonrheumatic mitral valve regurgitation, HFpEF, advanced PVD, anemia of chronic disease, and hx of duodenal/jejunal angioectasias in 2022.     Clinical Impressions Re-evaluation completed due to medical condition changes noted above since last OT session on 5/7. Pt currently demonstrates ability to complete UB ADLs Independent to Contact guard assist, LB ADLs with Mod assist of +1 to +2 for safety, bed mobility with Min to Mod assist +2, and functional transfers with a RW with Mod assist +2 for safety. Pt participated well in session and has been making overall progress toward OT goals. However, pt with decrease in functional level this session secondary to increased pain, decreased activity tolerance, and increased signs of anxiety this session following cardiac arrest requiring CPR with broken ribs during endoscopy on 5/8. OT goals and discharge recommendation updated this day based on pt current functional level. Pt will benefit from continued acute skilled OT services to address deficits outlined below and increase safety and independence with functional tasks. Post acute discharge, OT recommends intensive inpatient skilled rehab services > 3 hours per day to maximize rehab potential.      If plan is discharge home, recommend the following:   Two people to help with walking and/or transfers;Two people to help with bathing/dressing/bathroom;Assistance with cooking/housework;Assist for transportation;Help with stairs or ramp for entrance     Functional  Status Assessment   Patient has had a recent decline in their functional status and demonstrates the ability to make significant improvements in function in a reasonable and predictable amount of time.     Equipment Recommendations   BSC/3in1;Tub/shower seat     Recommendations for Other Services   Rehab consult     Precautions/Restrictions   Precautions Precautions: Fall;Other (comment) Precaution/Restrictions Comments: rib fxs Restrictions Weight Bearing Restrictions Per Provider Order: No     Mobility Bed Mobility Overal bed mobility: Needs Assistance Bed Mobility: Rolling, Sit to Sidelying Rolling: Min assist       Sit to sidelying: Mod assist, +2 for safety/equipment, HOB elevated General bed mobility comments: ModAx2 for safety to bring LEs onto EOB, MinA to roll. Pt holding pillow to chest    Transfers Overall transfer level: Needs assistance Equipment used: Rolling walker (2 wheels) Transfers: Sit to/from Stand, Bed to chair/wheelchair/BSC Sit to Stand: Mod assist, +2 safety/equipment, +2 physical assistance     Step pivot transfers: Mod assist, +2 safety/equipment     General transfer comment: Able to push with B UE on recliner handles. ModAx2 for boost-up with pt initially leaning posteriorly. Able to take steps towards bed with CGAx2 for safety. Cues for sequencing and direction      Balance Overall balance assessment: Mild deficits observed, not formally tested, History of Falls                                         ADL either performed or assessed with clinical judgement   ADL Overall ADL's : Needs assistance/impaired Eating/Feeding: Independent;Sitting Eating/Feeding Details (indicate  cue type and reason): eating ice chips with a spoon; pt otherwise NPO at this time Grooming: Set up;Supervision/safety;Sitting;Cueing for compensatory techniques Grooming Details (indicate cue type and reason): cues for compensatory  strategies for increased comfort/decreased pain due to rib fxs Upper Body Bathing: Contact guard assist;Sitting;Cueing for compensatory techniques Upper Body Bathing Details (indicate cue type and reason): cues for compensatory strategies for increased comfort/decreased pain due to rib fxs Lower Body Bathing: Moderate assistance;Sitting/lateral leans;Sit to/from stand;Cueing for compensatory techniques;+2 for safety/equipment Lower Body Bathing Details (indicate cue type and reason): cues for compensatory strategies for increased comfort/decreased pain due to rib fxs Upper Body Dressing : Contact guard assist;Sitting;Cueing for compensatory techniques Upper Body Dressing Details (indicate cue type and reason): cues for compensatory strategies for increased comfort/decreased pain due to rib fxs Lower Body Dressing: Moderate assistance;Sitting/lateral leans;Sit to/from stand;Cueing for compensatory techniques;+2 for safety/equipment Lower Body Dressing Details (indicate cue type and reason): cues for compensatory strategies for increased comfort/decreased pain due to rib fxs Toilet Transfer: Moderate assistance;+2 for safety/equipment;BSC/3in1;Rolling walker (2 wheels) (step-pivot transfer) Toilet Transfer Details (indicate cue type and reason): simulated chair to bed; cues for compensatory strategies for increased comfort/decreased pain due to rib fxs Toileting- Clothing Manipulation and Hygiene: Moderate assistance;Cueing for compensatory techniques;Sit to/from stand;Sitting/lateral lean Toileting - Clothing Manipulation Details (indicate cue type and reason): cues for compensatory strategies for increased comfort/decreased pain due to rib fxs     Functional mobility during ADLs:  (deferred this session pt pt request as pt with increased signs of anxiety and report of fear of falling in standing and stepping this session) General ADL Comments: Pt with decreased activity tolerance and pain affecting  functional level.     Vision   Vision Assessment?: No apparent visual deficits     Perception         Praxis         Pertinent Vitals/Pain Pain Assessment Pain Assessment: Faces Faces Pain Scale: Hurts whole lot Pain Location: chest with movement, buttocks in sitting Pain Descriptors / Indicators: Aching, Discomfort, Crying, Grimacing Pain Intervention(s): Limited activity within patient's tolerance, Monitored during session, Repositioned     Extremity/Trunk Assessment Upper Extremity Assessment Upper Extremity Assessment: Right hand dominant;Generalized weakness (Pt with mildly increased soreness in chest with B shoulder flexion and B shoulder horizontal abduction.)   Lower Extremity Assessment Lower Extremity Assessment: Defer to PT evaluation   Cervical / Trunk Assessment Cervical / Trunk Assessment: Normal   Communication Communication Communication: Impaired Factors Affecting Communication: Hearing impaired   Cognition Arousal: Alert Behavior During Therapy: WFL for tasks assessed/performed, Anxious Cognition: No apparent impairments             OT - Cognition Comments: Pt AAOx4 and pleasant throughout session. Pt with mild signs of anxiety during transfers with pt requiring encouragement to participate and benefitting from positive reinforcement.                 Following commands: Intact       Cueing  General Comments   Cueing Techniques: Verbal cues  Pt's daughter and son present during session. NT present during a portion of session.   Exercises     Shoulder Instructions      Home Living Family/patient expects to be discharged to:: Private residence Living Arrangements:  (daughter and son next door) Available Help at Discharge: Family;Available 24 hours/day Type of Home: House Home Access: Ramped entrance     Home Layout: One level     Bathroom Shower/Tub: Tub/shower  unit;Walk-in shower   Bathroom Toilet: Standard Bathroom  Accessibility: No   Home Equipment: Agricultural consultant (2 wheels);Grab bars - tub/shower;Wheelchair Financial trader (4 wheels);BSC/3in1;Cane - single point          Prior Functioning/Environment Prior Level of Function : Needs assist;History of Falls (last six months)             Mobility Comments: At baseline, pt ambulates with no AD in the home and with a cane in the community. Pt reports multiple falls beginning approx. one week prior to admission. ADLs Comments: At baseline, pt is Independent with ADLs and light meal prep. Pt receives assistance from family for cleaning and other home management tasks.    OT Problem List: Decreased strength;Decreased activity tolerance;Decreased safety awareness;Impaired balance (sitting and/or standing);Decreased knowledge of use of DME or AE;Decreased knowledge of precautions;Cardiopulmonary status limiting activity;Pain   OT Treatment/Interventions: Self-care/ADL training;Therapeutic exercise;Energy conservation;DME and/or AE instruction;Therapeutic activities;Patient/family education;Balance training      OT Goals(Current goals can be found in the care plan section)   Acute Rehab OT Goals Patient Stated Goal: to feel better, be more independent, and return home Time For Goal Achievement: 03/17/24 Potential to Achieve Goals: Good ADL Goals Pt Will Perform Upper Body Dressing: with modified independence;sitting Pt Will Perform Lower Body Dressing: with supervision;sitting/lateral leans;sit to/from stand Pt Will Transfer to Toilet: with supervision;ambulating;bedside commode (with least restrictive AD) Pt Will Perform Tub/Shower Transfer: Shower transfer;Tub transfer;with supervision;ambulating;shower seat (with least restrictive AD)   OT Frequency:  Min 2X/week    Co-evaluation PT/OT/SLP Co-Evaluation/Treatment: Yes Reason for Co-Treatment: Complexity of the patient's impairments (multi-system involvement);For patient/therapist safety    OT goals addressed during session: ADL's and self-care      AM-PAC OT "6 Clicks" Daily Activity     Outcome Measure Help from another person eating meals?: None Help from another person taking care of personal grooming?: A Little Help from another person toileting, which includes using toliet, bedpan, or urinal?: A Lot Help from another person bathing (including washing, rinsing, drying)?: A Lot Help from another person to put on and taking off regular upper body clothing?: A Little Help from another person to put on and taking off regular lower body clothing?: A Lot 6 Click Score: 16   End of Session Equipment Utilized During Treatment: Rolling walker (2 wheels);Oxygen Nurse Communication: Mobility status  Activity Tolerance: Patient tolerated treatment well;Other (comment);Patient limited by pain (Pt with mildly self-limiting behaviors due to signs of increased anxiety in standing.stepping and pt reported fear of falling.) Patient left: in bed;with call bell/phone within reach;with family/visitor present  OT Visit Diagnosis: Unsteadiness on feet (R26.81);Other abnormalities of gait and mobility (R26.89);Muscle weakness (generalized) (M62.81);History of falling (Z91.81);Pain;Other (comment) (decreased activity tolerance)                Time: 1610-9604 OT Time Calculation (min): 30 min Charges:  OT General Charges $OT Visit: 1 Visit OT Evaluation $OT Re-eval: 1 Re-eval  Alize Borrayo "Darral Ellis., OTR/L, MA Acute Rehab [phone]   Walt Gunner 03/03/2024, 1:11 PM

## 2024-03-04 ENCOUNTER — Inpatient Hospital Stay (HOSPITAL_COMMUNITY)

## 2024-03-04 DIAGNOSIS — N186 End stage renal disease: Secondary | ICD-10-CM | POA: Diagnosis not present

## 2024-03-04 DIAGNOSIS — K31811 Angiodysplasia of stomach and duodenum with bleeding: Secondary | ICD-10-CM | POA: Diagnosis not present

## 2024-03-04 DIAGNOSIS — I132 Hypertensive heart and chronic kidney disease with heart failure and with stage 5 chronic kidney disease, or end stage renal disease: Secondary | ICD-10-CM | POA: Diagnosis not present

## 2024-03-04 DIAGNOSIS — Z66 Do not resuscitate: Secondary | ICD-10-CM | POA: Diagnosis not present

## 2024-03-04 LAB — RENAL FUNCTION PANEL
Albumin: 2.7 g/dL — ABNORMAL LOW (ref 3.5–5.0)
Anion gap: 15 (ref 5–15)
BUN: 42 mg/dL — ABNORMAL HIGH (ref 8–23)
CO2: 26 mmol/L (ref 22–32)
Calcium: 9 mg/dL (ref 8.9–10.3)
Chloride: 83 mmol/L — ABNORMAL LOW (ref 98–111)
Creatinine, Ser: 4.75 mg/dL — ABNORMAL HIGH (ref 0.44–1.00)
GFR, Estimated: 9 mL/min — ABNORMAL LOW (ref >60)
Glucose, Bld: 174 mg/dL — ABNORMAL HIGH (ref 70–99)
Phosphorus: 4.6 mg/dL (ref 2.5–4.6)
Potassium: 4.7 mmol/L (ref 3.5–5.1)
Sodium: 124 mmol/L — ABNORMAL LOW (ref 135–145)

## 2024-03-04 LAB — CBC
HCT: 28.1 % — ABNORMAL LOW (ref 36.0–46.0)
Hemoglobin: 9.4 g/dL — ABNORMAL LOW (ref 12.0–15.0)
MCH: 33.2 pg (ref 26.0–34.0)
MCHC: 33.5 g/dL (ref 30.0–36.0)
MCV: 99.3 fL (ref 80.0–100.0)
Platelets: 177 10*3/uL (ref 150–400)
RBC: 2.83 MIL/uL — ABNORMAL LOW (ref 3.87–5.11)
RDW: 20.8 % — ABNORMAL HIGH (ref 11.5–15.5)
WBC: 7.6 10*3/uL (ref 4.0–10.5)
nRBC: 0 % (ref 0.0–0.2)

## 2024-03-04 LAB — GLUCOSE, CAPILLARY
Glucose-Capillary: 194 mg/dL — ABNORMAL HIGH (ref 70–99)
Glucose-Capillary: 247 mg/dL — ABNORMAL HIGH (ref 70–99)
Glucose-Capillary: 111 mg/dL — ABNORMAL HIGH (ref 70–99)

## 2024-03-04 MED ORDER — ANTICOAGULANT SODIUM CITRATE 4% (200MG/5ML) IV SOLN: 5.0000 mL | Status: DC | PRN

## 2024-03-04 MED ORDER — PENTAFLUOROPROP-TETRAFLUOROETH EX AERO: 1.0000 | INHALATION_SPRAY | CUTANEOUS | Status: DC | PRN

## 2024-03-04 MED ORDER — ACETAMINOPHEN 500 MG PO TABS
1000.0000 mg | ORAL_TABLET | Freq: Three times a day (TID) | ORAL | Status: DC
  Administered 2024-03-04 – 2024-03-11 (×20): 1000 mg via ORAL
  Filled 2024-03-04 (×20): qty 2

## 2024-03-04 MED ORDER — LIDOCAINE-PRILOCAINE 2.5-2.5 % EX CREA: 1.0000 | TOPICAL_CREAM | CUTANEOUS | Status: DC | PRN

## 2024-03-04 MED ORDER — PANTOPRAZOLE SODIUM 40 MG IV SOLR: 40.0000 mg | Freq: Two times a day (BID) | INTRAVENOUS | Status: DC

## 2024-03-04 MED ORDER — LIDOCAINE HCL (PF) 1 % IJ SOLN: 5.0000 mL | INTRAMUSCULAR | Status: DC | PRN

## 2024-03-04 MED ORDER — SENNOSIDES-DOCUSATE SODIUM 8.6-50 MG PO TABS
1.0000 | ORAL_TABLET | Freq: Every day | ORAL | Status: DC
  Administered 2024-03-04 – 2024-03-07 (×4): 1 via ORAL
  Filled 2024-03-04 (×4): qty 1

## 2024-03-04 MED ORDER — LIDOCAINE-PRILOCAINE 2.5-2.5 % EX CREA
1.0000 | TOPICAL_CREAM | CUTANEOUS | Status: DC | PRN
Start: 2024-03-04 — End: 2024-03-04

## 2024-03-04 MED ORDER — DICLOFENAC SODIUM 1 % EX GEL
4.0000 g | Freq: Four times a day (QID) | CUTANEOUS | Status: DC
  Administered 2024-03-05 – 2024-03-11 (×20): 4 g via TOPICAL
  Filled 2024-03-04: qty 100

## 2024-03-04 MED ORDER — LIDOCAINE HCL (PF) 1 % IJ SOLN
5.0000 mL | INTRAMUSCULAR | Status: DC | PRN
  Filled 2024-03-04: qty 5

## 2024-03-04 MED ORDER — PENTAFLUOROPROP-TETRAFLUOROETH EX AERO
1.0000 | INHALATION_SPRAY | CUTANEOUS | Status: DC | PRN
Start: 2024-03-04 — End: 2024-03-04

## 2024-03-04 MED ORDER — ALTEPLASE 2 MG IJ SOLR
2.0000 mg | Freq: Once | INTRAMUSCULAR | Status: DC | PRN
  Filled 2024-03-04: qty 2

## 2024-03-04 MED ORDER — HEPARIN SODIUM (PORCINE) 1000 UNIT/ML DIALYSIS
1000.0000 [IU] | INTRAMUSCULAR | Status: DC | PRN
  Filled 2024-03-04: qty 1

## 2024-03-04 MED ORDER — PANTOPRAZOLE SODIUM 40 MG PO TBEC
40.0000 mg | DELAYED_RELEASE_TABLET | Freq: Two times a day (BID) | ORAL | Status: DC
  Administered 2024-03-04 – 2024-03-11 (×14): 40 mg via ORAL
  Filled 2024-03-04 (×15): qty 1

## 2024-03-04 MED ORDER — ALTEPLASE 2 MG IJ SOLR: 2.0000 mg | Freq: Once | INTRAMUSCULAR | Status: DC | PRN

## 2024-03-04 MED ORDER — HEPARIN SODIUM (PORCINE) 1000 UNIT/ML DIALYSIS: 1000.0000 [IU] | INTRAMUSCULAR | Status: DC | PRN

## 2024-03-04 MED ORDER — ATORVASTATIN CALCIUM 10 MG PO TABS
10.0000 mg | ORAL_TABLET | Freq: Every day | ORAL | Status: DC
  Administered 2024-03-05 – 2024-03-11 (×4): 10 mg via ORAL
  Filled 2024-03-04 (×5): qty 1

## 2024-03-04 MED ORDER — HEPARIN SODIUM (PORCINE) 1000 UNIT/ML DIALYSIS
1000.0000 [IU] | INTRAMUSCULAR | Status: DC | PRN
Start: 2024-03-04 — End: 2024-03-04

## 2024-03-04 MED ORDER — INSULIN GLARGINE-YFGN 100 UNIT/ML ~~LOC~~ SOLN
6.0000 [IU] | Freq: Every day | SUBCUTANEOUS | Status: DC
  Administered 2024-03-04 – 2024-03-11 (×8): 6 [IU] via SUBCUTANEOUS
  Filled 2024-03-04 (×9): qty 0.06

## 2024-03-04 MED ORDER — ANTICOAGULANT SODIUM CITRATE 4% (200MG/5ML) IV SOLN
5.0000 mL | Status: DC | PRN
  Filled 2024-03-04: qty 5

## 2024-03-04 NOTE — Progress Notes (Signed)
   03/04/24 0157  Pain Assessment  Pain Scale 0-10  Pain Score 8  Pain Type Acute pain  Pain Location Chest  Pain Orientation Mid  Pain Radiating Towards To Back and Left Shoulder  Pain Descriptors / Indicators Aching;Constant  Pain Frequency Constant  Pain Onset On-going  Patients Stated Pain Goal 0  Pain Intervention(s) Medication (See eMAR);Emotional support  Provider Notification  Provider Name/Title Critical Care  Date Provider Notified 03/04/24  Time Provider Notified 0227  Method of Notification Page  Notification Reason Change in status  Provider response See new orders  Date of Provider Response 03/04/24   Patient has c/o mid sternal chest pain since beginning of shift secondary to CPR.  This morning she stated she felt like "something tore" when attempting to reposition self in bed.  She states mid sternal chest pain now radiates to back to left shoulder.  Rates pain as 8/10.  VS are stable.  Gave 5 mg oxycodone  IR at 0157.  Critical Care made aware.  Order received to do EKG.  EKG done and placed in chart.  Verified with Central Telemetry Monitoring, that patient has been NSR - lowest HR 71.  Will continue to monitor patient.  Necia Bali RN

## 2024-03-04 NOTE — Progress Notes (Addendum)
 [facility] Progress Note   Subjective:    Seen in room. Looks uncomfortable. Chest is sore from CPR. Breathing ok. Plan for dialysis today.   Objective Vitals:   03/03/24 2052 03/04/24 0211 03/04/24 0445 03/04/24 0730  BP: (!) 118/49 117/64 (!) 131/53 (!) 142/67  Pulse: 94 90 89 (!) 102  Resp: 19 19 17 19   Temp: 98.6 F (37 C) 98.5 F (36.9 C) 98.7 F (37.1 C) 98.4 F (36.9 C)  TempSrc: Oral Oral Oral   SpO2: 94% 96% 96% 93%  Weight:      Height:       Physical Exam General: Sitting up in bed, nad  Heart: RRR, Lungs: Clear bilaterally  Abdomen: Soft, non-distended Extremities: no LE edema  Dialysis Access: LUE AVG +bruit   Filed Weights   02/29/24 0845 02/29/24 1215 03/01/24 0600  Weight: 76.4 kg 74.4 kg 72.8 kg    Intake/Output Summary (Last 24 hours) at 03/04/2024 1139 Last data filed at 03/04/2024 0948 Gross per 24 hour  Intake 720 ml  Output 0 ml  Net 720 ml    Additional Objective Labs: Basic Metabolic Panel: Recent Labs  Lab 03/02/24 1132 03/02/24 1223 03/03/24 0500 03/04/24 0757  NA 121* 119* 126* 124*  K 5.3* 5.7* 4.4 4.7  CL 88*  --  87* 83*  CO2 18*  --  27 26  GLUCOSE 256*  --  101* 174*  BUN 55*  --  29* 42*  CREATININE 5.18*  --  3.50* 4.75*  CALCIUM  8.6*  --  8.3* 9.0  PHOS 5.5*  --  3.6 4.6   Liver Function Tests: Recent Labs  Lab 03/02/24 1132 03/03/24 0500 03/04/24 0757  AST 74*  --   --   ALT 52*  --   --   ALKPHOS 57  --   --   BILITOT 1.1  --   --   PROT 4.8*  --   --   ALBUMIN  2.7* 2.6* 2.7*   Recent Labs  Lab 03/02/24 1132  LIPASE 66*  AMYLASE 29   CBC: Recent Labs  Lab 03/02/24 1132 03/02/24 1145 03/02/24 1223 03/03/24 0500 03/03/24 1606 03/04/24 0757  WBC 6.0 7.4  --  8.0 10.4 7.6  HGB 7.0* 9.9*   < > 9.1* 9.5* 9.4*  HCT 21.6* 28.5*   < > 27.1* 28.5* 28.1*  MCV 106.4* 96.9  --  97.8 100.7* 99.3  PLT 129* 142*  --  141* 165 177   < > = values in this interval not displayed.   Blood  Culture    Component Value Date/Time   SDES BLOOD RIGHT HAND 03/16/2021 0046   SPECREQUEST  03/16/2021 0046    BOTTLES DRAWN AEROBIC ONLY Blood Culture results may not be optimal due to an inadequate volume of blood received in culture bottles   CULT  03/16/2021 0046    NO GROWTH 5 DAYS Performed at [facility], [address], [place], [place] [address]    REPTSTATUS 03/21/2021 FINAL 03/16/2021 0046    Cardiac Enzymes: No results for input(s): "CKTOTAL", "CKMB", "CKMBINDEX", "TROPONINI" in the last 168 hours. CBG: Recent Labs  Lab 03/03/24 1147 03/03/24 1642 03/03/24 2138 03/04/24 0732 03/04/24 1135  GLUCAP 132* 245* 268* 194* 247*   Iron  Studies: No results for input(s): "IRON ", "TIBC", "TRANSFERRIN", "FERRITIN" in the last 72 hours. Lab Results  Component Value Date   INR 1.4 (H) 03/02/2024   INR 1.0 03/29/2021   Studies/Results: DG  Chest Port 1 View Result Date: 03/04/2024 CLINICAL DATA:  Rib fractures EXAM: PORTABLE CHEST 1 VIEW COMPARISON:  Yesterday FINDINGS: Right more than left pleural effusion and hazy lower chest opacification. Some improvement in aeration at the bases. Cardiomegaly. Bulky mitral annular calcification. Left IJ line with tip near the SVC origin. History of rib fractures not well characterized. IMPRESSION: Borderline improvement in aeration. There is still vascular congestion, pleural fluid, and atelectasis at the bases. Electronically Signed   By: Ronnette Coke M.D.   On: 03/04/2024 06:54   DG Chest Port 1 View Result Date: 03/03/2024 CLINICAL DATA:  Weakness, anemia. EXAM: PORTABLE CHEST 1 VIEW COMPARISON:  Mar 02, 2024. FINDINGS: Stable cardiomediastinal silhouette. Mild central pulmonary vascular congestion is noted. Bibasilar edema or atelectasis is noted with associated effusions. Endotracheal and nasogastric tubes have been removed. Left internal jugular catheter is unchanged. IMPRESSION: Mild central pulmonary vascular congestion with  bibasilar edema or atelectasis with associated pleural effusions. Electronically Signed   By: Rosalene Colon M.D.   On: 03/03/2024 10:43   ECHOCARDIOGRAM COMPLETE Result Date: 03/02/2024    ECHOCARDIOGRAM REPORT   Patient Name:   Anna Carr Date of Exam: 03/02/2024 Medical Rec #:  [id number]         Height:       61.0 in Accession #:    [id number]        Weight:       160.6 lb Date of Birth:  September 10, 1941         BSA:          1.721 m Patient Age:    83 years          BP:           143/66 mmHg Patient Gender: F                 HR:           93 bpm. Exam Location:  Inpatient Procedure: 2D Echo, Cardiac Doppler and Color Doppler (Both Spectral and Color            Flow Doppler were utilized during procedure). Indications:    CHF-Acute Systolic  History:        Patient has prior history of Echocardiogram examinations. CHF,                 COPD; Risk Factors:Hypertension.  Sonographer:    Willey Harrier Referring Phys: [id number] Sammie Crigler S MINOR IMPRESSIONS  1. Left ventricular ejection fraction, by estimation, is 50 to 55%. The left ventricle has low normal function. The left ventricle demonstrates regional wall motion abnormalities (see scoring diagram/findings for description). Left ventricular diastolic  parameters are consistent with Grade II diastolic dysfunction (pseudonormalization).  2. Right ventricular systolic function is normal. The right ventricular size is normal.  3. Left atrial size was severely dilated.  4. The mitral valve is normal in structure. Moderate to severe mitral valve regurgitation. Moderate mitral stenosis. The mean mitral valve gradient is 7.5 mmHg with average heart rate of 92 bpm. Moderate mitral annular calcification.  5. Tricuspid valve regurgitation is moderate.  6. The aortic valve is tricuspid. There is mild calcification of the aortic valve. There is mild thickening of the aortic valve. Aortic valve regurgitation is not visualized. No aortic stenosis is present.  7. Pulmonic valve  regurgitation is moderate to severe.  8. IVC 2.5 cm. FINDINGS  Left Ventricle: Left ventricular ejection fraction, by estimation, is 50 to 55%. The left  ventricle has low normal function. The left ventricle demonstrates regional wall motion abnormalities. The left ventricular internal cavity size was normal in size. There is no left ventricular hypertrophy. Left ventricular diastolic parameters are consistent with Grade II diastolic dysfunction (pseudonormalization).  LV Wall Scoring: The anterior septum is normal. Right Ventricle: The right ventricular size is normal. No increase in right ventricular wall thickness. Right ventricular systolic function is normal. Left Atrium: Left atrial size was severely dilated. Right Atrium: Right atrial size was normal in size. Pericardium: There is no evidence of pericardial effusion. Mitral Valve: The mitral valve is normal in structure. There is moderate thickening of the mitral valve leaflet(s). There is moderate calcification of the mitral valve leaflet(s). Moderate mitral annular calcification. Moderate to severe mitral valve regurgitation. Moderate mitral valve stenosis. MV peak gradient, 12.8 mmHg. The mean mitral valve gradient is 7.5 mmHg with average heart rate of 92 bpm. Tricuspid Valve: The tricuspid valve is normal in structure. Tricuspid valve regurgitation is moderate . No evidence of tricuspid stenosis. Aortic Valve: The aortic valve is tricuspid. There is mild calcification of the aortic valve. There is mild thickening of the aortic valve. Aortic valve regurgitation is not visualized. No aortic stenosis is present. Aortic valve peak gradient measures 13.0 mmHg. Pulmonic Valve: The pulmonic valve was normal in structure. Pulmonic valve regurgitation is moderate to severe. No evidence of pulmonic stenosis. Aorta: The aortic root is normal in size and structure. Venous: IVC 2.5 cm. IVC assessment for right atrial pressure unable to be performed due to mechanical  ventilation. IAS/Shunts: No atrial level shunt detected by color flow Doppler.  LEFT VENTRICLE PLAX 2D LVIDd:         4.80 cm   Diastology LVIDs:         4.10 cm   LV e' medial:    6.85 cm/s LV PW:         0.80 cm   LV E/e' medial:  24.1 LV IVS:        0.80 cm   LV e' lateral:   7.62 cm/s LVOT diam:     1.90 cm   LV E/e' lateral: 21.7 LV SV:         53 LV SV Index:   31 LVOT Area:     2.84 cm  RIGHT VENTRICLE            IVC RV Basal diam:  3.60 cm    IVC diam: 2.50 cm RV S prime:     8.59 cm/s TAPSE (M-mode): 1.2 cm LEFT ATRIUM              Index        RIGHT ATRIUM           Index LA diam:        4.20 cm  2.44 cm/m   RA Area:     18.30 cm LA Vol (A2C):   105.0 ml 61.03 ml/m  RA Volume:   51.80 ml  30.11 ml/m LA Vol (A4C):   76.5 ml  44.46 ml/m LA Biplane Vol: 90.3 ml  52.48 ml/m  AORTIC VALVE AV Area (Vmax): 1.36 cm AV Vmax:        180.00 cm/s AV Peak Grad:   13.0 mmHg LVOT Vmax:      86.20 cm/s LVOT Vmean:     62.100 cm/s LVOT VTI:       0.188 m  AORTA Ao Root diam: 3.00 cm Ao Asc diam:  3.00 cm  MITRAL VALVE                TRICUSPID VALVE MV Area (PHT): 3.81 cm     TR Peak grad:   21.2 mmHg MV Area VTI:   1.64 cm     TR Vmax:        230.00 cm/s MV Peak grad:  12.8 mmHg MV Mean grad:  7.5 mmHg     SHUNTS MV Vmax:       1.79 m/s     Systemic VTI:  0.19 m MV Vmean:      131.5 cm/s   Systemic Diam: 1.90 cm MV Decel Time: 199 msec MV E velocity: 165.00 cm/s MV A velocity: 145.00 cm/s MV E/A ratio:  1.14 Maudine Sos MD Electronically signed by Maudine Sos MD Signature Date/Time: 03/02/2024/4:19:45 PM    Final     Medications:    sodium chloride    Intravenous Once   acetaminophen   500 mg Oral Q6H   Chlorhexidine  Gluconate Cloth  6 each Topical Q0600   feeding supplement  237 mL Oral BID BM   ferric citrate   210 mg Oral TID WC   furosemide   80 mg Oral Daily   insulin  aspart  0-5 Units Subcutaneous QHS   insulin  aspart  0-6 Units Subcutaneous TID WC   lidocaine   1 patch Transdermal Q24H    pantoprazole   40 mg Oral BID   polyethylene glycol  17 g Oral Daily   senna-docusate  1 tablet Oral QHS   sodium chloride  flush  3 mL Intravenous Q12H    Dialysis Orders: [facility] 3.5h   B400    70.6kg    LUA AVG   Heparin  none Last OP HD 5/01, post wt 70.9 Comes off 0-2kg over, bp's wnl on HD Mircera 200mcg IV q 2 weeks- last dose 02/24/24  Assessment/Plan: 83 year old woman with ESRD on HD who presented with acute blood loss anemia, found to have GI bleed on capsule endoscopy.  Underwent EGD 5/8.  EGD was complicated by bradycardic arrest. She was transferred to the ICU postprocedure.   Symptomatic anemia/GI bleed.  Hb 6.6 on admission. 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 GI recommends against future endoscopy. Supportive treatment. Serial H/H. Transfuse prn Hgb <7.  Cardiac arrest:  2/2 hypotension and bradycardia. S/p brief CPR with rib fractures. Now extubated.  Hyponatremia: chronic mild hypoNa high 120s low 130s.   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  HD today 5/10 HTN:  BP stable Volume: euvolemic after HD Anemia of esrd + ABLA:  s/p 2 units PRBC 5/2. ESA given 02/24/24 --Next due 5/15.  Hb 9s Secondary hyperparathyroidism: Ca and phos in range, cont binders ac tid.  IDDM: per pmd Generalized weakness. Looks like being evaluated for CIR    [name] PA-C [facility] 03/04/2024,11:39 AM

## 2024-03-04 NOTE — Progress Notes (Signed)
 Received patient in bed.Alert and oriented x 4.Consent verified.  Access used: Left arm avg that worked well..  Duration of treatment: 3.5 hours.  Uf goal : Met 2.5 liters  Hand off to the patient's night HD nurse.

## 2024-03-04 NOTE — Progress Notes (Signed)
 Inpatient Rehab Admissions Coordinator:    I spoke with Pt. And daughter regarding potential CIR admit. Daughter states that they are possibly interested and can provide 24/7 support at d/c, but she is worried that pt. Is in a lot of pain and not sure she can tolerate 3 hours of therapy every day. She has specific concerns regarding pt.'s shoulder and requested MD see her. I sent secure chat to medical team.   Wandalee Gust, MS, CCC-SLP Rehab Admissions Coordinator  [phone] (celll) [phone] (office)

## 2024-03-04 NOTE — Progress Notes (Addendum)
 HD#3 SUBJECTIVE:  Patient Summary: Anna Carr is a 83 y.o. with a pertinent PMH of ESRD on HD TThS, T2DM, nonrheumatic mitral valve regurgitation, HFpEF, advanced PVD, anemia of chronic disease, and hx of duodenal/jejunal angioectasias in 2022 who presented with weakness and melenic stool and admitted for concern for UGI bleed.   She experienced brief cardiac arrest during EGD on 03/02/24 and was subsequently intubated and transferred to ICU. Extubated later on 5/8 and IMTS assumed care on medical floor on 5/10.  Overnight Events and Interim History: Arrived to floor today. Complains of L shoulder pain from straining when trying to adjust herself in bed. She has ongoing chest pain secondary to her compressions and is tender to touch. She is otherwise without complaint including abdominal pain, N/V, dyspnea, bloody Bms.  Significant Hospital Events:  5/8 Cardiac arrest during EGD on 5/8, intubated in ICU 5/8 Extubated 5/10 Returned to floor care  OBJECTIVE:  Vital Signs: Vitals:   03/03/24 2052 03/04/24 0211 03/04/24 0445 03/04/24 0730  BP: (!) 118/49 117/64 (!) 131/53 (!) 142/67  Pulse: 94 90 89 (!) 102  Resp: 19 19 17 19   Temp: 98.6 F (37 C) 98.5 F (36.9 C) 98.7 F (37.1 C) 98.4 F (36.9 C)  TempSrc: Oral Oral Oral   SpO2: 94% 96% 96% 93%  Weight:      Height:       Supplemental O2: Room Air SpO2: 93 % O2 Flow Rate (L/min): 2 L/min FiO2 (%): 50 %  Filed Weights   02/29/24 0845 02/29/24 1215 03/01/24 0600  Weight: 76.4 kg 74.4 kg 72.8 kg    Intake/Output Summary (Last 24 hours) at 03/04/2024 0846 Last data filed at 03/04/2024 0600 Gross per 24 hour  Intake 600 ml  Output 0 ml  Net 600 ml   Net IO Since Admission: -3,110.58 mL [03/04/24 0846]  Physical Exam: Physical Exam Constitutional:      General: She is not in acute distress.    Appearance: She is not ill-appearing.  HENT:     Mouth/Throat:     Mouth: Mucous membranes are moist.  Cardiovascular:      Rate and Rhythm: Normal rate and regular rhythm.     Pulses: Normal pulses.  Pulmonary:     Effort: Pulmonary effort is normal.     Breath sounds: Normal breath sounds. No wheezing or rales.  Abdominal:     General: Abdomen is flat. Bowel sounds are normal.     Tenderness: There is no abdominal tenderness. There is no guarding.  Musculoskeletal:        General: No tenderness or signs of injury.     Right lower leg: No edema.     Left lower leg: No edema.     Comments: L shoulder is not tender, swollen, or hot  Skin:    General: Skin is warm and dry.  Neurological:     General: No focal deficit present.     Mental Status: She is alert. Mental status is at baseline.  Psychiatric:        Mood and Affect: Mood normal.        Behavior: Behavior normal.    Patient Lines/Drains/Airways Status     Active Line/Drains/Airways     Name Placement date Placement time Site Days   CVC Double Lumen 03/02/24 Left Internal jugular 16 cm 03/02/24  1054  -- 2   Fistula / Graft Left Upper arm Arteriovenous vein graft 03/12/21  1254  Upper arm  1088   Wound / Incision (Open or Dehisced) 02/26/24 Other (Comment) Knee Right;Left;Anterior bilateral knee abrasion 02/26/24  1900  Knee  7            ASSESSMENT/PLAN:  Assessment: Principal Problem:   Acute on chronic anemia Active Problems:   Type 2 diabetes mellitus with chronic kidney disease on chronic dialysis, with long-term current use of insulin  ([name])   ESRD on dialysis ([name])   Angiodysplasia of small intestine   Symptomatic anemia   Cardiac arrest [facility])   Musculoskeletal chest pain   AVM (arteriovenous malformation) of duodenum, acquired   Acute upper GI bleed  Anna Carr is a 83 y.o. with a pertinent PMH of ESRD on HD TThS, T2DM, nonrheumatic mitral valve regurgitation, HFpEF, advanced PVD, anemia of chronic disease, and hx of duodenal/jejunal angioectasias in 2022 who presented with weakness and melenic stool and admitted  for concern for UGI bleed on hospital day 5.  She experienced brief cardiac arrest during EGD on 03/02/24 and was subsequently intubated and transferred to ICU. Extubated later on 5/8 and IMTS assumed care on medical floor on 5/10.  Plan: Cardiac Arrest during EGD with CPR Likely vagal, bradycardia and arrest. Short duration of CPR until ROSC. Spent two nights in ICU, was extubated 5/8 and transferred 5/10. Pt no longer candidate for endoscopy. - lidocaine  patch, tylenol , oxycodone  prn for chest wall pain - At risk for aspiration, will monitor respiratory status  Acute on Chronic Anemia from Acute Upper GI bleed Hx of duodenal/jejunal angioectasias Hx Diverticulosis (2022)  EGD on 5/8 with argon plasma coagulation of non bleeding AVMs in the duodenum, otherwise no ulcers identified. Hg stable since. No gross blood per rectum. Pt chronically has black stools. - transfusion goal > 7 - High dose PPI BID - Dys diet diet - GI following peripherally, thank you - continue to work with PT/OT for strength and endurance - Medically stable, pending CIR  L shoulder pain Etiology from moving herself in bed. ROM limited by pain. No acute changes otherwise.  - Voltaren Gel, L shoulder XR  ESRD on HD TThS Resume dialysis schedule. Monitor fluid balance and electrolytes. HD today. - No piccs   T2DM Patient was hypoglycemic earlier in hospitalization and so maintenance insulin  stopped. Will resume at lower dose as sugars are trending above 200. -continue to monitor glucose, continue SSI -A1c below 6, on 12 semglee  PTH, restart at 6 units -increase insulin  aspart prior to meals if needed    Acute on Chronic Hyponatremia Improved with return to diet, potentially due to recently being NPO and ESRD. Baseline 127-130 range. No symptoms. - continue to monitor with metabolic panel  - Ensure feeding supplement   HFpEF Stable, not in exacerbation. Last echo 12/2023 with 60-65 EF and normal function of the  LV. Has moderate to severe MR. No aortic stenosis.  - Continue lasix  and resume coreg  near discharge - Daily weights, I&Os. Net -3L since admit due to HD   Best Practice: Diet: Dys 2 IVF: None VTE: SCDs Start: 03/02/24 1129 SCDs Start: 02/25/24 1331 Code: Full AB: None Pain Medicine: Tylenol , oxycodone  5 q4 prn, lidocaine  patch Bowel Regimen: Miralax  daily, sennakot Therapy Recs: CIR DISPO: Anticipated discharge in 1-3 days to CIR pending evaluation/bed availability.  Signature: Carleen Chary, D.O.  Internal Medicine Resident, PGY-1 [facility]  Pager: # [phone]. 8:46 AM, 03/04/2024

## 2024-03-04 NOTE — Progress Notes (Signed)
 eLink Physician-Brief Progress Note Patient Name: Anna Carr DOB: 1941/01/13 MRN: [id number]   Date of Service  03/04/2024  HPI/Events of Note  Patient with clearly reproducible musculoskeletal pain s/p CPR, EKG is normal, and saturation is normal as well, she feels better after getting a dose of Oxycodone .  eICU Interventions  Will order a CXR for completeness (to r/o fractured ribs).        Janeal Abadi U Tripton Ned 03/04/2024, 4:01 AM

## 2024-03-04 NOTE — Progress Notes (Signed)
 Mobility Specialist Progress Note   03/04/24 1445  Pain Assessment  Pain Assessment 0-10  Pain Score 8  Faces Pain Scale 8  Pain Location Left Shoulder  Pain Descriptors / Indicators Burning;Grimacing;Guarding;Sharp;Discomfort  Pain Intervention(s) Limited activity within patient's tolerance  Mobility  Activity Refused mobility   Attempted to see patient but complaining of severe shoulder pain. Stated that while she was repositioning in bed, she felt severe pain + tear sensation in L. Shoulder while pulling herself up in bed. During assessment, HD arrived for treatment. Will f/u as time permits.  [place] Dovey Fatzinger, BS EXP Mobility Specialist Please contact via SecureChat or Rehab office at [phone]

## 2024-03-05 ENCOUNTER — Inpatient Hospital Stay (HOSPITAL_COMMUNITY)

## 2024-03-05 ENCOUNTER — Encounter (HOSPITAL_COMMUNITY): Payer: Self-pay | Admitting: Gastroenterology

## 2024-03-05 DIAGNOSIS — M25512 Pain in left shoulder: Secondary | ICD-10-CM

## 2024-03-05 DIAGNOSIS — D649 Anemia, unspecified: Secondary | ICD-10-CM | POA: Diagnosis not present

## 2024-03-05 HISTORY — DX: Pain in left shoulder: M25.512

## 2024-03-05 LAB — CBC
HCT: 28.7 % — ABNORMAL LOW (ref 36.0–46.0)
Hemoglobin: 9.4 g/dL — ABNORMAL LOW (ref 12.0–15.0)
MCH: 33.6 pg (ref 26.0–34.0)
MCHC: 32.8 g/dL (ref 30.0–36.0)
MCV: 102.5 fL — ABNORMAL HIGH (ref 80.0–100.0)
Platelets: 162 10*3/uL (ref 150–400)
RBC: 2.8 MIL/uL — ABNORMAL LOW (ref 3.87–5.11)
RDW: 20.9 % — ABNORMAL HIGH (ref 11.5–15.5)
WBC: 7.3 10*3/uL (ref 4.0–10.5)
nRBC: 0 % (ref 0.0–0.2)

## 2024-03-05 LAB — RENAL FUNCTION PANEL
Albumin: 2.7 g/dL — ABNORMAL LOW (ref 3.5–5.0)
Anion gap: 12 (ref 5–15)
BUN: 25 mg/dL — ABNORMAL HIGH (ref 8–23)
CO2: 26 mmol/L (ref 22–32)
Calcium: 9.1 mg/dL (ref 8.9–10.3)
Chloride: 91 mmol/L — ABNORMAL LOW (ref 98–111)
Creatinine, Ser: 3.42 mg/dL — ABNORMAL HIGH (ref 0.44–1.00)
GFR, Estimated: 13 mL/min — ABNORMAL LOW (ref >60)
Glucose, Bld: 226 mg/dL — ABNORMAL HIGH (ref 70–99)
Phosphorus: 3.8 mg/dL (ref 2.5–4.6)
Potassium: 4.4 mmol/L (ref 3.5–5.1)
Sodium: 129 mmol/L — ABNORMAL LOW (ref 135–145)

## 2024-03-05 LAB — GLUCOSE, CAPILLARY
Glucose-Capillary: 204 mg/dL — ABNORMAL HIGH (ref 70–99)
Glucose-Capillary: 185 mg/dL — ABNORMAL HIGH (ref 70–99)
Glucose-Capillary: 257 mg/dL — ABNORMAL HIGH (ref 70–99)

## 2024-03-05 MED ORDER — METHOCARBAMOL 500 MG PO TABS
500.0000 mg | ORAL_TABLET | Freq: Once | ORAL | Status: AC
  Administered 2024-03-05: 500 mg via ORAL
  Filled 2024-03-05: qty 1

## 2024-03-05 NOTE — Progress Notes (Signed)
 Anna Carr Progress Note   Subjective:    Seen in room. Completed dialysis yesterday w no issues. Net UF 2.5L. Still having some shoulder pain.   Objective Vitals:   03/04/24 1930 03/04/24 2025 03/05/24 0505 03/05/24 0717  BP: (!) 141/61 (!) 127/52 115/60 123/68  Pulse: (!) 102 92 99 99  Resp: 19 18 18 18   Temp:  98 F (36.7 C) 98.4 F (36.9 C) 98.4 F (36.9 C)  TempSrc:  Oral Oral   SpO2: 95% 91% 90% 91%  Weight:      Height:       Physical Exam General: Sitting up in bed, nad  Heart: RRR, Lungs: Clear bilaterally  Abdomen: Soft, non-distended Extremities: no LE edema  Dialysis Access: LUE AVG +bruit   Filed Weights   03/01/24 0600 03/04/24 0703 03/04/24 1541  Weight: 72.8 kg 72.7 kg 72.9 kg    Intake/Output Summary (Last 24 hours) at 03/05/2024 1043 Last data filed at 03/05/2024 0943 Gross per 24 hour  Intake 900 ml  Output 2.5 ml  Net 897.5 ml    Additional Objective Labs: Basic Metabolic Panel: Recent Labs  Lab 03/03/24 0500 03/04/24 0757 03/05/24 0500  NA 126* 124* 129*  K 4.4 4.7 4.4  CL 87* 83* 91*  CO2 27 26 26   GLUCOSE 101* 174* 226*  BUN 29* 42* 25*  CREATININE 3.50* 4.75* 3.42*  CALCIUM  8.3* 9.0 9.1  PHOS 3.6 4.6 3.8   Liver Function Tests: Recent Labs  Lab 03/02/24 1132 03/03/24 0500 03/04/24 0757 03/05/24 0500  AST 74*  --   --   --   ALT 52*  --   --   --   ALKPHOS 57  --   --   --   BILITOT 1.1  --   --   --   PROT 4.8*  --   --   --   ALBUMIN  2.7* 2.6* 2.7* 2.7*   Recent Labs  Lab 03/02/24 1132  LIPASE 66*  AMYLASE 29   CBC: Recent Labs  Lab 03/02/24 1145 03/02/24 1223 03/03/24 0500 03/03/24 1606 03/04/24 0757 03/05/24 0500  WBC 7.4  --  8.0 10.4 7.6 7.3  HGB 9.9*   < > 9.1* 9.5* 9.4* 9.4*  HCT 28.5*   < > 27.1* 28.5* 28.1* 28.7*  MCV 96.9  --  97.8 100.7* 99.3 102.5*  PLT 142*  --  141* 165 177 162   < > = values in this interval not displayed.   Blood Culture    Component Value  Date/Time   SDES BLOOD RIGHT HAND 03/16/2021 0046   SPECREQUEST  03/16/2021 0046    BOTTLES DRAWN AEROBIC ONLY Blood Culture results may not be optimal due to an inadequate volume of blood received in culture bottles   CULT  03/16/2021 0046    NO GROWTH 5 DAYS Performed at [facility], [address], [place], [place] [address]    REPTSTATUS 03/21/2021 FINAL 03/16/2021 0046    Cardiac Enzymes: No results for input(s): "CKTOTAL", "CKMB", "CKMBINDEX", "TROPONINI" in the last 168 hours. CBG: Recent Labs  Lab 03/03/24 2138 03/04/24 0732 03/04/24 1135 03/04/24 2020 03/05/24 0719  GLUCAP 268* 194* 247* 111* 204*   Iron  Studies: No results for input(s): "IRON ", "TIBC", "TRANSFERRIN", "FERRITIN" in the last 72 hours. Lab Results  Component Value Date   INR 1.4 (H) 03/02/2024   INR 1.0 03/29/2021   Studies/Results: DG Shoulder Left Result Date: 03/04/2024 CLINICAL DATA:  Left shoulder  pain. EXAM: LEFT SHOULDER - 2+ VIEW COMPARISON:  None Available. FINDINGS: The glenohumeral and AC joints are maintained. Minimal degenerative changes for the patient's age. No acute bony findings. No bone lesions. The visualized left ribs are intact and the visualized left lung is grossly clear. IMPRESSION: Minimal degenerative changes but no acute bony findings. Electronically Signed   By: Marrian Siva M.D.   On: 03/04/2024 17:29   DG Chest Port 1 View Result Date: 03/04/2024 CLINICAL DATA:  Rib fractures EXAM: PORTABLE CHEST 1 VIEW COMPARISON:  Yesterday FINDINGS: Right more than left pleural effusion and hazy lower chest opacification. Some improvement in aeration at the bases. Cardiomegaly. Bulky mitral annular calcification. Left IJ line with tip near the SVC origin. History of rib fractures not well characterized. IMPRESSION: Borderline improvement in aeration. There is still vascular congestion, pleural fluid, and atelectasis at the bases. Electronically Signed   By: Ronnette Coke M.D.   On:  03/04/2024 06:54    Medications:  anticoagulant sodium citrate        acetaminophen   1,000 mg Oral Q8H   atorvastatin   10 mg Oral Daily   Chlorhexidine  Gluconate Cloth  6 each Topical Q0600   diclofenac Sodium  4 g Topical QID   feeding supplement  237 mL Oral BID BM   ferric citrate   210 mg Oral TID WC   furosemide   80 mg Oral Daily   insulin  aspart  0-5 Units Subcutaneous QHS   insulin  aspart  0-6 Units Subcutaneous TID WC   insulin  glargine-yfgn  6 Units Subcutaneous Daily   lidocaine   1 patch Transdermal Q24H   pantoprazole   40 mg Oral BID   polyethylene glycol  17 g Oral Daily   senna-docusate  1 tablet Oral QHS    Dialysis Orders: [facility] 3.5h   B400    70.6kg    LUA AVG   Heparin  none Last OP HD 5/01, post wt 70.9 Comes off 0-2kg over, bp's wnl on HD Mircera 200mcg IV q 2 weeks- last dose 02/24/24  Assessment/Plan: 83 year old woman with ESRD on HD who presented with acute blood loss anemia, found to have GI bleed on capsule endoscopy.  Underwent EGD 5/8.  EGD was complicated by bradycardic arrest. She was transferred to the ICU postprocedure.   Symptomatic anemia/GI bleed.  Hb 6.6 on admission. 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 GI recommends against future endoscopy d/t procedural bradycardia. Supportive management going forward. Serial H/H. Transfuse prn Hgb <7.  Cardiac arrest:  2/2 hypotension and bradycardia. S/p brief CPR with rib fractures. Now extubated.  Hyponatremia: chronic mild hypoNa high 120s low 130s. Improved with HD yesterday   ESRD: on HD TTS.  Next HD Tues HTN:  BP stable Volume: euvolemic after HD Anemia of esrd + ABLA:  s/p 2 units PRBC 5/2. ESA given 02/24/24 --Next due 5/15.  Hb 9s Secondary hyperparathyroidism: Ca and phos in range, cont binders ac tid.  IDDM: per pmd Generalized weakness. Looks like being evaluated for CIR    Anna Hal PA-C [facility] 03/05/2024,10:43 AM

## 2024-03-05 NOTE — Progress Notes (Signed)
 HD#4 SUBJECTIVE:  Patient Summary: Anna Carr is a 83 y.o. with a pertinent PMH of ESRD on HD TThS, T2DM, nonrheumatic mitral valve regurgitation, HFpEF, advanced PVD, anemia of chronic disease, and hx of duodenal/jejunal angioectasias in 2022 who presented with weakness and melenic stool and admitted for concern for UGI bleed.    She experienced brief cardiac arrest during EGD on 03/02/24 and was subsequently intubated and transferred to ICU. Extubated later on 5/8 and IMTS assumed care on medical floor on 5/10.  Overnight Events and Interim History: She reports ongoing shoulder pain located posteriorly near the medial scapula with some relief from oral and topical medicine. In relation, her chest pain from CPR is less of an issue. Denies bloody Bms, abdominal pain, continues to eat well, but not getting out of bed.   Significant Hospital Events:  5/8 Cardiac arrest during EGD on 5/8, intubated in ICU 5/8 Extubated 5/10 Returned to floor care  OBJECTIVE:  Vital Signs: Vitals:   03/04/24 1930 03/04/24 2025 03/05/24 0505 03/05/24 0717  BP: (!) 141/61 (!) 127/52 115/60 123/68  Pulse: (!) 102 92 99 99  Resp: 19 18 18 18   Temp:  98 F (36.7 C) 98.4 F (36.9 C) 98.4 F (36.9 C)  TempSrc:  Oral Oral   SpO2: 95% 91% 90% 91%  Weight:      Height:       Supplemental O2: Room Air SpO2: 91 % O2 Flow Rate (L/min): 2 L/min FiO2 (%): 50 %  Filed Weights   03/01/24 0600 03/04/24 0703 03/04/24 1541  Weight: 72.8 kg 72.7 kg 72.9 kg     Intake/Output Summary (Last 24 hours) at 03/05/2024 1610 Last data filed at 03/05/2024 0600 Gross per 24 hour  Intake 1020 ml  Output 2.5 ml  Net 1017.5 ml   Net IO Since Admission: -2,093.08 mL [03/05/24 0904]  Physical Exam: Physical Exam Constitutional:      General: She is not in acute distress.    Appearance: She is not ill-appearing.  Cardiovascular:     Rate and Rhythm: Normal rate and regular rhythm.     Pulses: Normal pulses.   Abdominal:     General: Abdomen is flat. Bowel sounds are normal.     Tenderness: There is no abdominal tenderness. There is no guarding.  Musculoskeletal:        General: Tenderness present. No swelling or deformity.     Comments: L shoulder pain localized to L medial scapular area with mild TTP, no swelling or deformity, not acutely warm or cool. She uses the arm with limited ROM for eating and self care but is limited by pain and winces often during examination.  Skin:    General: Skin is warm and dry.  Neurological:     General: No focal deficit present.     Mental Status: She is alert.    Patient Lines/Drains/Airways Status     Active Line/Drains/Airways     Name Placement date Placement time Site Days   CVC Double Lumen 03/02/24 Left Internal jugular 16 cm 03/02/24  1054  -- 3   Fistula / Graft Left Upper arm Arteriovenous vein graft 03/12/21  1254  Upper arm  1089   Wound / Incision (Open or Dehisced) 02/26/24 Other (Comment) Knee Right;Left;Anterior bilateral knee abrasion 02/26/24  1900  Knee  8             ASSESSMENT/PLAN:  Assessment: Principal Problem:   Acute on chronic anemia Active Problems:  Type 2 diabetes mellitus with chronic kidney disease on chronic dialysis, with long-term current use of insulin  ([name])   ESRD on dialysis ([name])   Angiodysplasia of small intestine   Symptomatic anemia   Cardiac arrest [facility])   Musculoskeletal chest pain   AVM (arteriovenous malformation) of duodenum, acquired   Acute upper GI bleed  Anna Carr is a 83 y.o. with a pertinent PMH of ESRD on HD TThS, T2DM, nonrheumatic mitral valve regurgitation, HFpEF, advanced PVD, anemia of chronic disease, and hx of duodenal/jejunal angioectasias in 2022 who presented with weakness and melenic stool and admitted for concern for UGI bleed on hospital day 5.   She experienced brief cardiac arrest during EGD on 03/02/24 and was subsequently intubated and transferred to ICU.  Extubated later on 5/8 and IMTS assumed care on medical floor on 5/10.  She is medically stable and disposition planning underway.  Plan: L shoulder pain, musculoskeletal Etiology from moving herself in bed, possible influence from her recent CPR. ROM reduced given pain but she uses the arm at times. She has tenderness at the medical L scapula but no other acute findings. XR yesterday unrevealing.  - Voltaren Gel & tylenol  & oxycodone  available for breakthrough, trial robaxin and aquathermia, encouraged gentle exercise - Request PT/OT work, need to consider this in light of plan for CIR vs SNF discharge - Remove CVC @ L internal jugular which might be complicating things, attempt peripheral access at R arm but okay if unable (Can't do L per fistula)  Recent Cardiac Arrest during EGD with CPR Likely vagal with intra-procedural bradycardia and arrest. Short duration of CPR until ROSC. Spent two nights in ICU, was extubated 5/8 and transferred 5/10. Pt no longer candidate for endoscopy. - lidocaine  patch, tylenol , oxycodone  prn for chest wall pain - At risk for aspiration, will monitor respiratory status, which continues to be stable   Acute on Chronic Anemia from Acute Upper GI bleed Hx of duodenal/jejunal angioectasias Hx Diverticulosis (2022)  This was her presenting issue. EGD on 5/8 with argon plasma coagulation of non bleeding AVMs in the duodenum, otherwise no ulcers identified. Hg stable since - mid 9s. No gross blood per rectum. Pt chronically has black stools. - transfusion goal > 7 - Will likely resume her aspirin  but would stop for good if she bleeds again in future - High dose PPI BID - Dys diet diet - GI following peripherally, thank you - continue to work with PT/OT for strength and endurance - Medically stable, pending placement   ESRD on HD TThS Resume dialysis schedule. Monitor fluid balance and electrolytes. Had HD yesterday. - No piccs   T2DM Patient was hypoglycemic  earlier in hospitalization and so maintenance insulin  stopped. Will resume at lower dose as sugars are trending above 200. -continue to monitor glucose, continue SSI -A1c below 6, on 12 semglee  PTH, restarted at 6 units daily   Acute on Chronic Hyponatremia Improved with return to diet, potentially due to recently being NPO and ESRD. Baseline 127-130 range. No symptoms. - continue to monitor with metabolic panel  - Ensure feeding supplement   HFpEF Stable, not in exacerbation. Last echo 12/2023 with 60-65 EF and normal function of the LV. Has moderate to severe MR. No aortic stenosis.  - Continue lasix  and resume coreg  near discharge - Daily weights, I&Os. Net -3L since admit due to HD   Best Practice: Diet: Dys 2  IVF: n/a VTE: SCDs Start: 03/02/24 1129 SCDs Start: 02/25/24 1331 Code:  Full AB: n/a Pain Medicine: Tylenol , oxycodone  5 q4 prn, lidocaine  patch, aquathermia Bowel Regimen: Miralax  daily, senokot  Therapy Recs: CIR vs SNF DISPO: Anticipated discharge in 1-3 days to CIR vs SNF pending evaluation/bed availability.   Signature: Carleen Chary, D.O.  Internal Medicine Resident, PGY-1 [facility]  Pager: # [phone]. 9:04 AM, 03/05/2024

## 2024-03-05 NOTE — PMR Pre-admission (Shared)
 PMR Admission Coordinator Pre-Admission Assessment  Patient: Anna Carr is an 83 y.o., female MRN: [id number] DOB: 04-Jul-1941 Height: 5\' 1"  (154.9 cm) Weight: 72.9 kg  Insurance Information HMO: yes    PPO:      PCP:      IPA:      80/20:      OTHER:  PRIMARY: U[employer] Medicare      Policy#: ***      Subscriber: *** CM Name: ***      Phone#: ***     Fax#: *** Pre-Cert#: ***      Employer: *** Benefits:  Phone #: ***     Name: *** Eff. Date: ***     Deduct: ***      Out of Pocket Max: ***      Life Max: *** CIR: ***      SNF: *** Outpatient: ***     Co-Pay: *** Home Health: ***      Co-Pay: *** DME: ***     Co-Pay: *** Providers: *** SECONDARY:       Policy#:      Phone#:   Financial Counselor:       Phone#:   The "Data Collection Information Summary" for patients in Inpatient Rehabilitation Facilities with attached "Privacy Act Statement-Health Care Records" was provided and verbally reviewed with: Patient  Emergency Contact Information Contact Information     Name Relation Home Work Mobile   [place] Son [phone]  [phone]   Mosie Argue Daughter [phone]  [phone]      Other Contacts   None on File     Current Medical History  Patient Admitting Diagnosis: Cardiac Arrest   History of Present Illness: Anna Carr is an 83 yo female with ESRD on HD TThS, T2DM, nonrheumatic mitral valve regurgitation, HFpEF, advanced PVD, anemia of chronic disease, and hx of duodenal/jejunal angioectasias in 2022 who presented to [facility] ED on 02/25/24 with a few days of worsening weakness and admitted for concern for UGI bleed. 5/5 small bowel endoscopy. 5/8 endoscopy where pt had cardiac arrest requiring brief CPR with broken ribs. 5/8 admit to ICU. She was seen by PT/OT during her admission and they recommend CIR to assist return to PLOF.     Patient's medical record from [facility]  has been reviewed by the rehabilitation  admission coordinator and physician.  Past Medical History  Past Medical History:  Diagnosis Date   Anemia, chronic disease 01/23/2017   Arteriovenous fistula, acquired ([name]) 11/02/2019   AVM (arteriovenous malformation)    C. 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 No  Family History   family history includes  Diabetes in her maternal grandfather and mother; Hypertension in her mother; Liver disease in an other family member.  Current Medications  Current Facility-Administered Medications:    acetaminophen  (TYLENOL ) tablet 1,000 mg, 1,000 mg, Oral, Q8H, Zheng, Michael, DO, 1,000 mg at 03/05/24 2536   alteplase  (CATHFLO ACTIVASE ) injection 2 mg, 2 mg, Intracatheter, Once PRN, Kruska, Lindsay A, MD   anticoagulant sodium citrate  solution 5 mL, 5 mL, Intracatheter, PRN, Kruska, Lindsay A, MD   atorvastatin  (LIPITOR) tablet 10 mg, 10 mg, Oral, Daily, Zheng, Michael, DO, 10 mg at 03/05/24 6440   benzonatate  (TESSALON ) capsule 200 mg, 200 mg, Oral, TID PRN, Aurora Lees, DO, 200 mg at 03/02/24 0504   Chlorhexidine  Gluconate Cloth 2 % PADS 6 each, 6 each, Topical, Q0600, Kitty Perkins, NP, 6 each at  03/04/24 3474   diclofenac Sodium (VOLTAREN) 1 % topical gel 4 g, 4 g, Topical, QID, Zheng, Michael, DO, 4 g at 03/05/24 0816   feeding supplement (ENSURE ENLIVE / ENSURE PLUS) liquid 237 mL, 237 mL, Oral, BID BM, Juberg, Christopher, DO, 237 mL at 03/01/24 1317   ferric citrate  (AURYXIA ) tablet 210 mg, 210 mg, Oral, TID WC, Cirigliano, Vito V, DO, 210 mg at 03/05/24 0811   furosemide  (LASIX ) tablet 80 mg, 80 mg, Oral, Daily, Cirigliano, Vito V, DO, 80 mg at 03/05/24 2595   heparin  injection 1,000 Units, 1,000 Units, Intracatheter, PRN, Kruska, Lindsay A, MD   insulin  aspart (novoLOG ) injection 0-5 Units, 0-5 Units, Subcutaneous, QHS, Cirigliano, Vito V, DO, 3 Units at 03/03/24 2225   insulin  aspart (novoLOG ) injection 0-6 Units, 0-6 Units, Subcutaneous, TID WC, Cirigliano, Vito V, DO, 2 Units at 03/05/24 0815   insulin  glargine-yfgn (SEMGLEE ) injection 6 Units, 6 Units, Subcutaneous, Daily, Juberg, Christopher, DO, 6 Units at 03/05/24 0917   lidocaine  (LIDODERM ) 5 % 1 patch, 1 patch, Transdermal, Q24H, Payne, John D, PA-C, 1 patch at 03/05/24 6387   lidocaine  (PF) (XYLOCAINE ) 1 % injection 5 mL, 5 mL, Intradermal, PRN, Kruska, Lindsay A, MD   melatonin tablet 3 mg, 3 mg, Oral, QHS PRN, Aurora Lees, DO, 3 mg at 03/04/24 2129   oxyCODONE  (Oxy IR/ROXICODONE ) immediate release tablet 5 mg, 5 mg, Oral, Q4H PRN, Joesph Mussel, DO, 5 mg at 03/04/24 2130   pantoprazole  (PROTONIX ) EC tablet 40 mg, 40 mg, Oral, BID, Zheng, Michael, DO, 40 mg at 03/05/24 5643   pentafluoroprop-tetrafluoroeth (GEBAUERS) aerosol 1 Application, 1 Application, Topical, PRN, Kruska, Lindsay A, MD   pentafluoroprop-tetrafluoroeth (GEBAUERS) aerosol 1 Application, 1 Application, Topical, PRN, Anderson, Courtney E, NP   polyethylene glycol (MIRALAX  / GLYCOLAX ) packet 17 g, 17 g, Oral, Daily, Young Hensen, RPH, 17 g at 03/05/24 0813   senna-docusate (Senokot-S) tablet 1 tablet, 1 tablet, Oral, QHS, Juberg, Christopher, DO, 1  tablet at 03/04/24 2131  Patients Current Diet:  Diet Order             DIET DYS 2 Room service appropriate? Yes with Assist; Fluid consistency: Thin  Diet effective now                   Precautions / Restrictions Precautions Precautions: Fall, Other (comment) Precaution/Restrictions Comments: rib fxs Restrictions Weight Bearing Restrictions Per Provider Order: No   Has the patient had 2 or more falls or a fall with injury in the past year? No  Prior Activity Level Limited Community (1-2x/wk): Pt went out for appts  Prior Functional Level Self Care: Did the patient need help bathing, dressing,  using the toilet or eating? Independent  Indoor Mobility: Did the patient need assistance with walking from room to room (with or without device)? Independent  Stairs: Did the patient need assistance with internal or external stairs (with or without device)? Independent  Functional Cognition: Did the patient need help planning regular tasks such as shopping or remembering to take medications? Independent  Patient Information Are you of Hispanic, Latino/a,or Spanish origin?: A. No, not of Hispanic, Latino/a, or Spanish origin What is your race?: A. White Do you need or want an interpreter to communicate with a doctor or health care staff?: 0. No  Patient's Response To:  Health Literacy and Transportation Is the patient able to respond to health literacy and transportation needs?: Yes Health Literacy - How often do you need to have someone help you when you read instructions, pamphlets, or other written material from your doctor or pharmacy?: Never In the past 12 months, has lack of transportation kept you from medical appointments or from getting medications?: No In the past 12 months, has lack of transportation kept you from meetings, work, or from getting things needed for daily living?: No  Home Assistive Devices / Equipment Home Equipment: Agricultural consultant (2 wheels), Grab  bars - tub/shower, Wheelchair - manual, Rollator (4 wheels), BSC/3in1, Cane - single point  Prior Device Use: Indicate devices/aids used by the patient prior to current illness, exacerbation or injury? Walker  Current Functional Level Cognition  Orientation Level: Oriented X4    Extremity Assessment (includes Sensation/Coordination)  Upper Extremity Assessment: Right hand dominant (Pt with mildly increased soreness in chest with B shoulder flexion and B shoulder horizontal abduction.)  Lower Extremity Assessment: Defer to PT evaluation    ADLs  Overall ADL's : Needs assistance/impaired Eating/Feeding: Independent, Sitting Eating/Feeding Details (indicate cue type and reason): eating ice chips with a spoon; pt otherwise NPO at this time Grooming: Set up, Supervision/safety, Sitting, Cueing for compensatory techniques Grooming Details (indicate cue type and reason): cues for compensatory strategies for increased comfort/decreased pain due to rib fxs Upper Body Bathing: Contact guard assist, Sitting, Cueing for compensatory techniques Upper Body Bathing Details (indicate cue type and reason): cues for compensatory strategies for increased comfort/decreased pain due to rib fxs Lower Body Bathing: Moderate assistance, Sitting/lateral leans, Sit to/from stand, Cueing for compensatory techniques, +2 for safety/equipment Lower Body Bathing Details (indicate cue type and reason): cues for compensatory strategies for increased comfort/decreased pain due to rib fxs Upper Body Dressing : Contact guard assist, Sitting, Cueing for compensatory techniques Upper Body Dressing Details (indicate cue type and reason): cues for compensatory strategies for increased comfort/decreased pain due to rib fxs Lower Body Dressing: Moderate assistance, Sitting/lateral leans, Sit to/from stand, Cueing for compensatory techniques, +2 for safety/equipment Lower Body Dressing Details (indicate cue type and reason): cues  for compensatory strategies for increased comfort/decreased pain due to rib fxs Toilet Transfer: Moderate assistance, +2 for safety/equipment, BSC/3in1, Rolling walker (2 wheels) (step-pivot transfer) Toilet Transfer Details (indicate cue type and reason): simulated chair to bed; cues for compensatory strategies for increased comfort/decreased pain due to rib fxs Toileting- Clothing Manipulation and Hygiene: Moderate assistance, Cueing for compensatory techniques, Sit to/from stand, Sitting/lateral lean Toileting - Clothing Manipulation Details (indicate cue type and reason): cues for compensatory strategies for increased comfort/decreased pain due to rib fxs Tub/ Shower Transfer: Engineer, petroleum Details (indicate cue type and reason): See bathing above. Functional mobility during ADLs:  (deferred this session pt pt request as pt with  increased signs of anxiety and report of fear of falling in standing and stepping this session) General ADL Comments: Pt with decreased activity tolerance and pain affecting functional level.    Mobility  Overal bed mobility: Needs Assistance Bed Mobility: Rolling, Sit to Sidelying Rolling: Min assist Sit to sidelying: Mod assist, +2 for safety/equipment, HOB elevated General bed mobility comments: ModAx2 for safety to bring LEs onto EOB, MinA to roll. Pt holding pillow to chest    Transfers  Overall transfer level: Needs assistance Equipment used: Rolling walker (2 wheels) Transfers: Sit to/from Stand, Bed to chair/wheelchair/BSC Sit to Stand: Mod assist, +2 safety/equipment, +2 physical assistance Bed to/from chair/wheelchair/BSC transfer type:: Step pivot Step pivot transfers: Mod assist, +2 safety/equipment General transfer comment: Able to push with B UE on recliner handles. ModAx2 for boost-up with pt initially leaning posteriorly. Able to take steps towards bed with CGAx2 for safety. Cues for sequencing and direction     Ambulation / Gait / Stairs / Wheelchair Mobility  Ambulation/Gait Ambulation/Gait assistance: Contact guard assist Gait Distance (Feet): 30 Feet Assistive device: Rolling walker (2 wheels) Gait Pattern/deviations: Step-through pattern, Decreased stride length, Drifts right/left, Staggering left General Gait Details: pt declined 2/2 pain Gait velocity: dec Gait velocity interpretation: <1.8 ft/sec, indicate of risk for recurrent falls    Posture / Balance Balance Overall balance assessment: Mild deficits observed, not formally tested, History of Falls    Special needs/care consideration Special service needs ***   Previous Home Environment (from acute therapy documentation) Living Arrangements: Children (daughter and son next door)  Lives With: Spouse Available Help at Discharge: Family, Available 24 hours/day Type of Home: House Home Layout: One level Home Access: Ramped entrance Bathroom Shower/Tub: Tub/shower unit, Health visitor: Standard Bathroom Accessibility: No Home Care Services: No  Discharge Living Setting Plans for Discharge Living Setting: Patient's home Type of Home at Discharge: House Discharge Home Layout: One level Discharge Home Access: Ramped entrance Discharge Bathroom Shower/Tub: Tub/shower unit Discharge Bathroom Toilet: Standard Discharge Bathroom Accessibility: Yes How Accessible: Accessible via walker Does the patient have any problems obtaining your medications?: No  Social/Family/Support Systems Patient Roles: Other (Comment) Contact Information: [phone] Anticipated Caregiver: Lives with son Sam Creighton, daughter also can assist Anticipated Caregiver's Contact Information: min A Ability/Limitations of Caregiver: 24/7 Caregiver Availability: 24/7 Discharge Plan Discussed with Primary Caregiver: No Is Caregiver In Agreement with Plan?: No Does Caregiver/Family have Issues with Lodging/Transportation while Pt is in Rehab?:  No  Goals Patient/Family Goal for Rehab: PT/OT/SLP min A Expected length of stay: 12-14 days Pt/Family Agrees to Admission and willing to participate: Yes Program Orientation Provided & Reviewed with Pt/Caregiver Including Roles  & Responsibilities: Yes  Decrease burden of Care through IP rehab admission: not anticipated  Possible need for SNF placement upon discharge: not anticipated  Patient Condition: {PATIENT'S CONDITION:22832}  Preadmission Screen Completed By:  Dorena Gander, 03/05/2024 10:27 AM ______________________________________________________________________   Discussed status with Dr. Aaron Aas on *** at *** and received approval for admission today.  Admission Coordinator:  Dorena Gander, CCC-SLP, time Aaron AasAlanna Hu ***   Assessment/Plan: Diagnosis: *** Does the need for close, 24 hr/day Medical supervision in concert with the patient's rehab needs make it unreasonable for this patient to be served in a less intensive setting? {yes_no_potentially:3041433} Co-Morbidities requiring supervision/potential complications: *** Due to {due UJ:8119147}, does the patient require 24 hr/day rehab nursing? {yes_no_potentially:3041433} Does the patient require coordinated care of a physician, rehab nurse, PT, OT, and SLP to address physical and  functional deficits in the context of the above medical diagnosis(es)? {yes_no_potentially:3041433} Addressing deficits in the following areas: {deficits:3041436} Can the patient actively participate in an intensive therapy program of at least 3 hrs of therapy 5 days a week? {yes_no_potentially:3041433} The potential for patient to make measurable gains while on inpatient rehab is {potential:3041437} Anticipated functional outcomes upon discharge from inpatient rehab: {functional outcomes:304600100} PT, {functional outcomes:304600100} OT, {functional outcomes:304600100} SLP Estimated rehab length of stay to reach the above functional goals is:  *** Anticipated discharge destination: {anticipated dc setting:21604} 10. Overall Rehab/Functional Prognosis: {potential:3041437}   MD Signature: ***

## 2024-03-05 NOTE — Progress Notes (Signed)
 Modified Barium Swallow Study  Patient Details  Name: Anna Carr MRN: [id number] Date of Birth: 06-10-1941  Today's Date: 03/05/2024  Modified Barium Swallow completed.  Full report located under Chart Review in the Imaging Section.  History of Present Illness Anna Carr is an 83 yo female presenting to ED 5/2 with progressive weakness. Admitted with acute on chronic anemia. Underwent small bowel enteroscopy 5/8, during which she suffered cardiac arrest with ROSC achieved after brief CPR. Intubated <24 hrs. PMH includes ESRD on HD, T2DM, non-rheumatic mitral valve regurgitation, HFpEF, advanced PVD, anemia of chronic disease, history of duodenal/jejunal angiectasias 2022   Clinical Impression Pt presents with mild oropharyngeal dysphagia secondary to missing dentition and reduced pharyngeal strength. She achieves complete anterior hyoid movement and epiglottic inversion, yielding excellent airway protection but has increased pyrifrom sinus residue especially with thicker consistency boluses. This was cleared with a cued subswallow. Thin liquids are transiently penetrated (PAS 2), which is considered WFL. The barium tablet required multiple sips of thin liquids and bites of puree to transit from her lingual surface. There was noted esophageal stasis in the distal esophagus during the sweep that was noted to clear given subsequent sips of liquid. Pt had notable coughing during this time that was not representative of airway invasion. Question esophageal component given noted retention. Recommend continuing Dys 2 solids with thin liquids per pt preference. Feel she could upgrade to Dys 3 if she chooses. No further SLP f/u is clinically indicated at this time, will sign off. Factors that may increase risk of adverse event in presence of aspiration Roderick Civatte & Jessy Morocco 2021): Limited mobility  Swallow Evaluation Recommendations Recommendations: PO diet PO Diet Recommendation: Dysphagia 2  (Finely chopped);Thin liquids (Level 0) Liquid Administration via: Cup;Straw Medication Administration: Whole meds with liquid Supervision: Patient able to self-feed Swallowing strategies  : Minimize environmental distractions;Slow rate;Small bites/sips Postural changes: Position pt fully upright for meals;Stay upright 30-60 min after meals Oral care recommendations: Oral care BID (2x/day)    Amil Kale, M.A., CCC-SLP Speech Language Pathology, Acute Rehabilitation Services  Secure Chat preferred [phone]  03/05/2024,11:44 AM

## 2024-03-06 ENCOUNTER — Ambulatory Visit: Admitting: Cardiovascular Disease

## 2024-03-06 DIAGNOSIS — S46002D Unspecified injury of muscle(s) and tendon(s) of the rotator cuff of left shoulder, subsequent encounter: Secondary | ICD-10-CM

## 2024-03-06 DIAGNOSIS — R5381 Other malaise: Secondary | ICD-10-CM | POA: Diagnosis not present

## 2024-03-06 DIAGNOSIS — D62 Acute posthemorrhagic anemia: Secondary | ICD-10-CM | POA: Diagnosis not present

## 2024-03-06 DIAGNOSIS — K922 Gastrointestinal hemorrhage, unspecified: Secondary | ICD-10-CM | POA: Diagnosis not present

## 2024-03-06 LAB — CBC
HCT: 25.6 % — ABNORMAL LOW (ref 36.0–46.0)
Hemoglobin: 8.3 g/dL — ABNORMAL LOW (ref 12.0–15.0)
MCH: 33.5 pg (ref 26.0–34.0)
MCHC: 32.4 g/dL (ref 30.0–36.0)
MCV: 103.2 fL — ABNORMAL HIGH (ref 80.0–100.0)
Platelets: 153 10*3/uL (ref 150–400)
RBC: 2.48 MIL/uL — ABNORMAL LOW (ref 3.87–5.11)
RDW: 20.2 % — ABNORMAL HIGH (ref 11.5–15.5)
WBC: 6.2 10*3/uL (ref 4.0–10.5)
nRBC: 0 % (ref 0.0–0.2)

## 2024-03-06 LAB — RENAL FUNCTION PANEL
Albumin: 2.4 g/dL — ABNORMAL LOW (ref 3.5–5.0)
Anion gap: 12 (ref 5–15)
BUN: 38 mg/dL — ABNORMAL HIGH (ref 8–23)
CO2: 27 mmol/L (ref 22–32)
Calcium: 8.8 mg/dL — ABNORMAL LOW (ref 8.9–10.3)
Chloride: 89 mmol/L — ABNORMAL LOW (ref 98–111)
Creatinine, Ser: 5.07 mg/dL — ABNORMAL HIGH (ref 0.44–1.00)
GFR, Estimated: 8 mL/min — ABNORMAL LOW (ref >60)
Glucose, Bld: 96 mg/dL (ref 70–99)
Phosphorus: 4.1 mg/dL (ref 2.5–4.6)
Potassium: 4 mmol/L (ref 3.5–5.1)
Sodium: 128 mmol/L — ABNORMAL LOW (ref 135–145)

## 2024-03-06 LAB — GLUCOSE, CAPILLARY
Glucose-Capillary: 98 mg/dL (ref 70–99)
Glucose-Capillary: 172 mg/dL — ABNORMAL HIGH (ref 70–99)
Glucose-Capillary: 250 mg/dL — ABNORMAL HIGH (ref 70–99)
Glucose-Capillary: 273 mg/dL — ABNORMAL HIGH (ref 70–99)

## 2024-03-06 MED ORDER — IRON SUCROSE 300 MG IVPB - SIMPLE MED
300.0000 mg | Freq: Once | Status: AC
  Administered 2024-03-06: 300 mg via INTRAVENOUS
  Filled 2024-03-06: qty 300

## 2024-03-06 MED ORDER — CHLORHEXIDINE GLUCONATE CLOTH 2 % EX PADS: 6.0000 | MEDICATED_PAD | Freq: Every day | CUTANEOUS | Status: DC

## 2024-03-06 MED ORDER — MUSCLE RUB 10-15 % EX CREA
TOPICAL_CREAM | Freq: Two times a day (BID) | CUTANEOUS | Status: DC
  Filled 2024-03-06: qty 85

## 2024-03-06 NOTE — Consult Note (Signed)
 Physical Medicine and Rehabilitation Consult Reason for Consult:debility after prolonged hospital stay Referring Physician: Adriane Albe   HPI: Anna Carr is a 83 y.o. female with a histor of ESRD on HD, DM, MVR, CHF, PVD who presented on 5/2 with increased weakness over the course of a few days along with melenic stool and was admitted for GI bleed.. Pt experienced cardiac arrest during EGD and was intubated briefly. 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 She's had chest pain since the arrest due to chest compressions and also complains of left shoulder pain (potentially from using her arms to adjust herself in bed). Course complicated by hyponatremia and labile blood sugars. She was last seen by therapy on 5/9 and was mod assist for sit-std transfers and took only a few steps near bed d/t pain. Pt lives with daughter in a one level house with ramped entrance. She was independent to mod I overall prior to recent health issues, uses a cane in the community. She' had falls really only over the last week prior to admit.      Home: Home Living Family/patient expects to be discharged to:: Private residence Living Arrangements: Children (daughter and son next door) Available Help at Discharge: Family, Available 24 hours/day Type of Home: House Home Access: Ramped entrance Home Layout: One level Bathroom Shower/Tub: Tub/shower unit, Health visitor: Standard Bathroom Accessibility: No Home Equipment: Agricultural consultant (2 wheels), Grab bars - tub/shower, Wheelchair - manual, Rollator (4 wheels), BSC/3in1, Cane - single point  Lives With: Spouse  Functional History: Prior Function Prior Level of Function : Needs assist, History of Falls (last six months) Mobility Comments: At baseline, pt ambulates with no AD in the home and with a cane in the community. Pt reports multiple falls beginning approx. one week prior to admission. ADLs Comments: At baseline, pt is  Independent with ADLs and light meal prep. Pt receives assistance from family for cleaning and other home management tasks. Functional Status:  Mobility: Bed Mobility Overal bed mobility: Needs Assistance Bed Mobility: Rolling, Sit to Sidelying Rolling: Min assist Sit to sidelying: Mod assist, +2 for safety/equipment, HOB elevated General bed mobility comments: ModAx2 for safety to bring LEs onto EOB, MinA to roll. Pt holding pillow to chest Transfers Overall transfer level: Needs assistance Equipment used: Rolling walker (2 wheels) Transfers: Sit to/from Stand, Bed to chair/wheelchair/BSC Sit to Stand: Mod assist, +2 safety/equipment, +2 physical assistance Bed to/from chair/wheelchair/BSC transfer type:: Step pivot Step pivot transfers: Mod assist, +2 safety/equipment General transfer comment: Able to push with B UE on recliner handles. ModAx2 for boost-up with pt initially leaning posteriorly. Able to take steps towards bed with CGAx2 for safety. Cues for sequencing and direction Ambulation/Gait Ambulation/Gait assistance: Contact guard assist Gait Distance (Feet): 30 Feet Assistive device: Rolling walker (2 wheels) Gait Pattern/deviations: Step-through pattern, Decreased stride length, Drifts right/left, Staggering left General Gait Details: pt declined 2/2 pain Gait velocity: dec Gait velocity interpretation: <1.8 ft/sec, indicate of risk for recurrent falls    ADL: ADL Overall ADL's : Needs assistance/impaired Eating/Feeding: Independent, Sitting Eating/Feeding Details (indicate cue type and reason): eating ice chips with a spoon; pt otherwise NPO at this time Grooming: Set up, Supervision/safety, Sitting, Cueing for compensatory techniques Grooming Details (indicate cue type and reason): cues for compensatory strategies for increased comfort/decreased pain due to rib fxs Upper Body Bathing: Contact guard assist, Sitting, Cueing for compensatory techniques Upper Body Bathing  Details (indicate cue type and reason): cues for  compensatory strategies for increased comfort/decreased pain due to rib fxs Lower Body Bathing: Moderate assistance, Sitting/lateral leans, Sit to/from stand, Cueing for compensatory techniques, +2 for safety/equipment Lower Body Bathing Details (indicate cue type and reason): cues for compensatory strategies for increased comfort/decreased pain due to rib fxs Upper Body Dressing : Contact guard assist, Sitting, Cueing for compensatory techniques Upper Body Dressing Details (indicate cue type and reason): cues for compensatory strategies for increased comfort/decreased pain due to rib fxs Lower Body Dressing: Moderate assistance, Sitting/lateral leans, Sit to/from stand, Cueing for compensatory techniques, +2 for safety/equipment Lower Body Dressing Details (indicate cue type and reason): cues for compensatory strategies for increased comfort/decreased pain due to rib fxs Toilet Transfer: Moderate assistance, +2 for safety/equipment, BSC/3in1, Rolling walker (2 wheels) (step-pivot transfer) Toilet Transfer Details (indicate cue type and reason): simulated chair to bed; cues for compensatory strategies for increased comfort/decreased pain due to rib fxs Toileting- Clothing Manipulation and Hygiene: Moderate assistance, Cueing for compensatory techniques, Sit to/from stand, Sitting/lateral lean Toileting - Clothing Manipulation Details (indicate cue type and reason): cues for compensatory strategies for increased comfort/decreased pain due to rib fxs Tub/ Shower Transfer: Engineer, petroleum Details (indicate cue type and reason): See bathing above. Functional mobility during ADLs:  (deferred this session pt pt request as pt with increased signs of anxiety and report of fear of falling in standing and stepping this session) General ADL Comments: Pt with decreased activity tolerance and pain affecting functional  level.  Cognition: Cognition Orientation Level: Oriented X4 Cognition Arousal: Alert Behavior During Therapy: WFL for tasks assessed/performed, Anxious   Review of Systems  Constitutional: Negative.   HENT: Negative.    Eyes: Negative.   Respiratory:  Positive for cough and sputum production.   Cardiovascular:  Positive for chest pain.  Gastrointestinal: Negative.   Genitourinary: Negative.   Musculoskeletal:  Positive for back pain and myalgias.  Skin: Negative.   Neurological:  Positive for weakness.  Psychiatric/Behavioral:  Negative for suicidal ideas.    Past Medical History:  Diagnosis Date   Anemia, chronic disease 01/23/2017   Arteriovenous fistula, acquired ([name]) 11/02/2019   AVM (arteriovenous malformation)    C. 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   Type 2 diabetes mellitus, with long-term current use of insulin  ([name]) 03/11/2019   Past Surgical History:  Procedure Laterality Date   A/V FISTULAGRAM Left 02/17/2021   Procedure: A/V FISTULAGRAM;  Surgeon: Adine Hoof, MD;  Location: [facility] INVASIVE CV LAB;  Service: Cardiovascular;  Laterality: Left;   APPENDECTOMY     AV FISTULA PLACEMENT Left    AV FISTULA PLACEMENT Left 03/12/2021   Procedure: INSERTION OF LEFT UPPER ARM ARTERIOVENOUS (AV) GORE-TEX GRAFT;  Surgeon: Adine Hoof, MD;  Location: [facility] OR;  Service: Vascular;  Laterality: Left;   BALLOON ENTEROSCOPY  03/02/2024   Procedure: ENTEROSCOPY, USING BALLOON;  Surgeon: Annis Kinder, DO;  Location: [facility] ENDOSCOPY;  Service: Gastroenterology;;   COLONOSCOPY WITH PROPOFOL  N/A 03/18/2021   Procedure: COLONOSCOPY WITH PROPOFOL ;  Surgeon: Albertina Hugger, MD;  Location: [facility] ENDOSCOPY;  Service: Gastroenterology;  Laterality: N/A;   CYSTECTOMY     ENTEROSCOPY N/A 03/21/2021   Procedure: ENTEROSCOPY;  Surgeon: Albertina Hugger, MD;  Location: [facility] ENDOSCOPY;  Service: Gastroenterology;  Laterality: N/A;   ENTEROSCOPY N/A 12/20/2021   Procedure: ENTEROSCOPY;  Surgeon: Albertina Hugger, MD;  Location: [facility] ENDOSCOPY;  Service: Gastroenterology;  Laterality: N/A;   ENTEROSCOPY N/A 02/26/2024   Procedure: ENTEROSCOPY;  Surgeon: Nannette Babe, MD;  Location: [facility] ENDOSCOPY;  Service: Gastroenterology;  Laterality: N/A;   ENTEROSCOPY N/A 02/28/2024   Procedure: ENTEROSCOPY;  Surgeon: Annis Kinder, DO;  Location: [facility] ENDOSCOPY;  Service: Gastroenterology;  Laterality: N/A;   ESOPHAGOGASTRODUODENOSCOPY (EGD) WITH PROPOFOL  N/A 03/18/2021   Procedure: ESOPHAGOGASTRODUODENOSCOPY (EGD) WITH PROPOFOL ;  Surgeon: Albertina Hugger, MD;  Location: [facility] ENDOSCOPY;  Service: Gastroenterology;  Laterality: N/A;   GIVENS CAPSULE STUDY N/A 03/19/2021   Procedure: GIVENS CAPSULE STUDY;  Surgeon: Albertina Hugger, MD;  Location: [facility] ENDOSCOPY;  Service: Gastroenterology;  Laterality: N/A;   GIVENS CAPSULE STUDY N/A 02/29/2024   Procedure: IMAGING PROCEDURE, GI TRACT, INTRALUMINAL, VIA CAPSULE;  Surgeon: Annis Kinder, DO;  Location: [facility] ENDOSCOPY;  Service: Gastroenterology;  Laterality: N/A;   HEMOSTASIS CLIP PLACEMENT  12/20/2021   Procedure: HEMOSTASIS CLIP PLACEMENT;  Surgeon: Albertina Hugger, MD;  Location: [facility] ENDOSCOPY;  Service: Gastroenterology;;   HOT HEMOSTASIS N/A 03/21/2021   Procedure: HOT HEMOSTASIS  (ARGON PLASMA COAGULATION/BICAP);  Surgeon: Albertina Hugger, MD;  Location: [facility] ENDOSCOPY;  Service: Gastroenterology;  Laterality: N/A;   HOT HEMOSTASIS N/A 12/20/2021   Procedure: HOT HEMOSTASIS (ARGON PLASMA COAGULATION/BICAP);  Surgeon: Albertina Hugger, MD;  Location: [facility] ENDOSCOPY;  Service: Gastroenterology;  Laterality: N/A;   HOT HEMOSTASIS N/A 03/02/2024   Procedure: EGD, WITH ARGON PLASMA COAGULATION;  Surgeon: Annis Kinder, DO;  Location: [facility] ENDOSCOPY;  Service: Gastroenterology;  Laterality: N/A;   HYSTEROSCOPY     LOWER EXTREMITY ANGIOGRAPHY N/A 02/03/2024   Procedure: Lower Extremity Angiography;  Surgeon: Avanell Leigh, MD;  Location: [facility] INVASIVE CV LAB;  Service: Cardiovascular;  Laterality: N/A;   POLYPECTOMY  03/18/2021   Procedure: POLYPECTOMY;  Surgeon: Albertina Hugger, MD;  Location: [facility] ENDOSCOPY;  Service: Gastroenterology;;   Family History  Problem Relation Age of Onset   Diabetes Mother    Hypertension Mother    Diabetes Maternal Grandfather    Liver disease Other    Social History:  reports that she has quit smoking. Her smoking use included cigarettes. She has never used smokeless tobacco. She reports that she does not currently use alcohol . She reports that she does not use drugs. Allergies: No Known Allergies Medications Prior to Admission  Medication Sig Dispense Refill   acetaminophen  (TYLENOL ) 500 MG tablet Take 500 mg by mouth every 6 (six) hours as needed for mild pain (pain score 1-3) or moderate pain (pain score 4-6).     ascorbic acid (VITAMIN C) 1000 MG tablet Take 1,000 mg by mouth daily in the afternoon.     aspirin  EC 81 MG tablet Take 1 tablet (81 mg total) by mouth daily. Swallow whole.     atorvastatin  (LIPITOR) 10 MG tablet Take 1 tablet (10 mg total)  by mouth daily. (Patient taking differently: Take 10 mg by mouth at bedtime.) 90 tablet 3   Biotin 5000 MCG TABS Take 5,000 mcg by mouth daily in the afternoon.     carvedilol  (COREG )  6.25 MG tablet Take 6.25 mg by mouth 2 (two) times daily.     cyanocobalamin (VITAMIN B12) 1000 MCG tablet Take 1,000 mcg by mouth every evening.     ferric citrate  (AURYXIA ) 1 GM 210 MG(Fe) tablet Take 210 mg by mouth 3 (three) times daily with meals.     fluticasone  (FLONASE ) 50 MCG/ACT nasal spray Place 1 spray into both nostrils daily as needed (Congestion).     furosemide  (LASIX ) 80 MG tablet Take 80 mg by mouth daily.     Insulin  Glargine-Lixisenatide  (SOLIQUA ) 100-33 UNT-MCG/ML SOPN Inject 12 Units into the skin in the morning. (Patient taking differently: Inject 12 Units into the skin daily at 12 noon.)     Lactobacillus (PROBIOTIC ACIDOPHILUS PO) Take 1 capsule by mouth in the morning.     lidocaine -prilocaine  (EMLA ) cream Apply 1 application topically as needed (prior to fisula access). Tuesday, Thursday, Saturday for dialysis     Omega-3 Fatty Acids (FISH OIL PO) Take 2,400 mg by mouth in the morning and at bedtime.     pantoprazole  (PROTONIX ) 40 MG tablet Take 40 mg by mouth daily.     polyethylene glycol (MIRALAX  / GLYCOLAX ) 17 g packet Take 17 g by mouth daily as needed for moderate constipation.     Vitamin E 670 MG (1000 UT) CAPS Take 1,000 Units by mouth in the morning.     carboxymethylcellulose (REFRESH PLUS) 0.5 % SOLN Place 1 drop into both eyes 3 (three) times daily as needed (dry eyes).     pregabalin (LYRICA) 75 MG capsule Take 75 mg by mouth daily. (Patient not taking: Reported on 02/25/2024)       Blood pressure (!) 112/46, pulse 85, temperature 97.9 F (36.6 C), resp. rate 18, height 5\' 1"  (1.549 m), weight 73.8 kg, SpO2 93%. Physical Exam Constitutional:      General: She is not in acute distress.    Appearance: She is obese.  HENT:     Head: Normocephalic.     Right Ear: External ear normal.     Left Ear: External ear normal.     Nose: Nose normal.  Eyes:     Extraocular Movements: Extraocular movements intact.     Pupils: Pupils are equal, round, and reactive  to light.  Cardiovascular:     Rate and Rhythm: Normal rate.  Pulmonary:     Effort: Pulmonary effort is normal.     Comments: Frequent productive cough Abdominal:     Palpations: Abdomen is soft.  Musculoskeletal:     Cervical back: Normal range of motion.     Comments: Left shoulder tender with ER/IR as well as ABD. Chest wall tender. Some pain with palpation RLE  Neurological:     Comments: Alert and oriented x 3. Normal insight and awareness. Intact Memory. Normal language and speech. Cranial nerve exam unremarkable. MMT: RUE 4/5. LUE 3/5 (pain), BLE 3/5 HF, 3+ to 4- KE and 4/5 ADF/PF. Decreased sense of LT/pain in both feet? No abnl tone. DTR's 1+  Psychiatric:        Mood and Affect: Mood normal.        Behavior: Behavior normal.     Results for orders placed or performed during the hospital encounter of 02/25/24 (from the past 24 hours)  Glucose, capillary     Status: Abnormal   Collection Time: 03/05/24  4:52 PM  Result Value Ref Range   Glucose-Capillary 185 (H) 70 - 99 mg/dL  Glucose, capillary     Status: Abnormal   Collection Time: 03/05/24  8:41 PM  Result Value Ref Range   Glucose-Capillary 257 (H) 70 - 99 mg/dL   Comment 1 Notify RN    Comment 2 Document in Chart   Renal function panel     Status: Abnormal   Collection Time: 03/06/24  4:26 AM  Result Value Ref Range   Sodium 128 (L) 135 - 145 mmol/L   Potassium 4.0 3.5 - 5.1 mmol/L   Chloride 89 (L) 98 - 111 mmol/L   CO2 27 22 - 32 mmol/L   Glucose, Bld 96 70 - 99 mg/dL   BUN 38 (H) 8 - 23 mg/dL   Creatinine, Ser 0.98 (H) 0.44 - 1.00 mg/dL   Calcium  8.8 (L) 8.9 - 10.3 mg/dL   Phosphorus 4.1 2.5 - 4.6 mg/dL   Albumin  2.4 (L) 3.5 - 5.0 g/dL   GFR, Estimated 8 (L) >60 mL/min   Anion gap 12 5 - 15  CBC     Status: Abnormal   Collection Time: 03/06/24  4:26 AM  Result Value Ref Range   WBC 6.2 4.0 - 10.5 K/uL   RBC 2.48 (L) 3.87 - 5.11 MIL/uL   Hemoglobin 8.3 (L) 12.0 - 15.0 g/dL   HCT 11.9 (L) 14.7 - 82.9  %   MCV 103.2 (H) 80.0 - 100.0 fL   MCH 33.5 26.0 - 34.0 pg   MCHC 32.4 30.0 - 36.0 g/dL   RDW 56.2 (H) 13.0 - 86.5 %   Platelets 153 150 - 400 K/uL   nRBC 0.0 0.0 - 0.2 %  Glucose, capillary     Status: None   Collection Time: 03/06/24  7:33 AM  Result Value Ref Range   Glucose-Capillary 98 70 - 99 mg/dL   DG Swallowing Func-Speech Pathology Result Date: 03/05/2024 Table formatting from the original result was not included. Modified Barium Swallow Study Patient Details Name: Anna Carr MRN: [id number] Date of Birth: [birth year] Today's Date: 03/05/2024 HPI/PMH: HPI: MADDI KOBES is an 83 yo female presenting to ED 5/2 with progressive weakness. Admitted with acute on chronic anemia. Underwent small bowel enteroscopy 5/8, during which she suffered cardiac arrest with ROSC achieved after brief CPR. Intubated <24 hrs. PMH includes ESRD on HD, T2DM, non-rheumatic mitral valve regurgitation, HFpEF, advanced PVD, anemia of chronic disease, history of duodenal/jejunal angiectasias 2022 Clinical Impression: Clinical Impression: Pt presents with mild oropharyngeal dysphagia secondary to missing dentition and reduced pharyngeal strength. She achieves complete anterior hyoid movement and epiglottic inversion, yielding excellent airway protection but has increased pyrifrom sinus residue especially with thicker consistency boluses. This was cleared with a cued subswallow. Thin liquids are transiently penetrated (PAS 2), which is considered WFL. The barium tablet required multiple sips of thin liquids and bites of puree to transit from her lingual surface. There was noted esophageal stasis in the distal esophagus during the sweep that was noted to clear given subsequent sips of liquid. Pt had notable coughing during this time that was not representative of airway invasion. Question esophageal component given noted retention. Recommend continuing Dys 2 solids with thin liquids per pt preference. Feel she  could upgrade to Dys 3 if she chooses. No further SLP f/u is clinically indicated at this time, will sign off. Factors that  may increase risk of adverse event in presence of aspiration Roderick Civatte & Jessy Morocco 2021): Factors that may increase risk of adverse event in presence of aspiration Roderick Civatte & Jessy Morocco 2021): Limited mobility Recommendations/Plan: Swallowing Evaluation Recommendations Swallowing Evaluation Recommendations Recommendations: PO diet PO Diet Recommendation: Dysphagia 2 (Finely chopped); Thin liquids (Level 0) Liquid Administration via: Cup; Straw Medication Administration: Whole meds with liquid Supervision: Patient able to self-feed Swallowing strategies  : Minimize environmental distractions; Slow rate; Small bites/sips Postural changes: Position pt fully upright for meals; Stay upright 30-60 min after meals Oral care recommendations: Oral care BID (2x/day) Treatment Plan Treatment Plan Treatment recommendations: No treatment recommended at this time Follow-up recommendations: No SLP follow up Functional status assessment: Patient has not had a recent decline in their functional status. Recommendations Recommendations for follow up therapy are one component of a multi-disciplinary discharge planning process, led by the attending physician.  Recommendations may be updated based on patient status, additional functional criteria and insurance authorization. Assessment: Orofacial Exam: Orofacial Exam Oral Cavity: Oral Hygiene: WFL Oral Cavity - Dentition: Poor condition; Missing dentition Orofacial Anatomy: WFL Oral Motor/Sensory Function: WFL Anatomy: Anatomy: Suspected cervical osteophytes Boluses Administered: Boluses Administered Boluses Administered: Thin liquids (Level 0); Mildly thick liquids (Level 2, nectar thick); Moderately thick liquids (Level 3, honey thick); Puree; Solid  Oral Impairment Domain: Oral Impairment Domain Lip Closure: No labial escape Tongue control during bolus hold: Cohesive  bolus between tongue to palatal seal Bolus preparation/mastication: Timely and efficient chewing and mashing Bolus transport/lingual motion: Brisk tongue motion Oral residue: Complete oral clearance Location of oral residue : N/A Initiation of pharyngeal swallow : Pyriform sinuses  Pharyngeal Impairment Domain: Pharyngeal Impairment Domain Soft palate elevation: No bolus between soft palate (SP)/pharyngeal wall (PW) Laryngeal elevation: Complete superior movement of thyroid cartilage with complete approximation of arytenoids to epiglottic petiole Anterior hyoid excursion: Complete anterior movement Epiglottic movement: Complete inversion Laryngeal vestibule closure: Complete, no air/contrast in laryngeal vestibule Pharyngeal stripping wave : Present - complete Pharyngeal contraction (A/P view only): N/A Pharyngoesophageal segment opening: Complete distension and complete duration, no obstruction of flow Tongue base retraction: Trace column of contrast or air between tongue base and PPW Pharyngeal residue: Collection of residue within or on pharyngeal structures Location of pharyngeal residue: Pyriform sinuses  Esophageal Impairment Domain: Esophageal Impairment Domain Esophageal clearance upright position: Esophageal retention Pill: Pill Consistency administered: Thin liquids (Level 0) Thin liquids (Level 0): [facility] Penetration/Aspiration Scale Score: Penetration/Aspiration Scale Score 1.  Material does not enter airway: Mildly thick liquids (Level 2, nectar thick); Moderately thick liquids (Level 3, honey thick); Puree; Solid; Pill 2.  Material enters airway, remains ABOVE vocal cords then ejected out: Thin liquids (Level 0) Compensatory Strategies: Compensatory Strategies Compensatory strategies: No   General Information: Caregiver present: No  Diet Prior to this Study: Dysphagia 2 (finely chopped); Thin liquids (Level 0)   Temperature : Normal   Respiratory Status: WFL   Supplemental O2: None (Room air)   History of  Recent Intubation: Yes  Behavior/Cognition: Alert; Cooperative Self-Feeding Abilities: Able to self-feed Baseline vocal quality/speech: Normal Volitional Cough: Able to elicit Volitional Swallow: Able to elicit Exam Limitations: No limitations Goal Planning: Prognosis for improved oropharyngeal function: Good No data recorded No data recorded Patient/Family Stated Goal: none stated Consulted and agree with results and recommendations: Patient Pain: Pain Assessment Pain Assessment: Faces Pain Score: 8 Faces Pain Scale: 6 Pain Location: L shoulder Pain Descriptors / Indicators: Grimacing; Guarding; Sharp; Discomfort Pain Intervention(s): Monitored during session End of  Session: Start Time:SLP Start Time (ACUTE ONLY): 1116 Stop Time: SLP Stop Time (ACUTE ONLY): 1133 Time Calculation:SLP Time Calculation (min) (ACUTE ONLY): 17 min Charges: SLP Evaluations $ SLP Speech Visit: 1 Visit SLP Evaluations $MBS Swallow: 1 Procedure SLP visit diagnosis: SLP Visit Diagnosis: Dysphagia, oropharyngeal phase (R13.12) Past Medical History: Past Medical History: Diagnosis Date  Anemia, chronic disease 01/23/2017  Arteriovenous fistula, acquired ([name]) 11/02/2019  AVM (arteriovenous malformation)   C. 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  Type 2 diabetes mellitus, with long-term current use of insulin  ([name]) 03/11/2019 Past Surgical History:  Past Surgical History: Procedure Laterality Date  A/V FISTULAGRAM Left 02/17/2021  Procedure: A/V FISTULAGRAM;  Surgeon: Adine Hoof, MD;  Location: [facility] INVASIVE CV LAB;  Service: Cardiovascular;  Laterality: Left;  APPENDECTOMY    AV FISTULA PLACEMENT Left   AV FISTULA PLACEMENT Left 03/12/2021  Procedure: INSERTION OF LEFT UPPER ARM ARTERIOVENOUS (AV) GORE-TEX GRAFT;  Surgeon: Adine Hoof, MD;  Location: [facility] OR;  Service: Vascular;  Laterality: Left;  COLONOSCOPY WITH PROPOFOL  N/A 03/18/2021  Procedure: COLONOSCOPY WITH PROPOFOL ;  Surgeon: Albertina Hugger, MD;  Location: [facility] ENDOSCOPY;  Service: Gastroenterology;  Laterality: N/A;  CYSTECTOMY    ENTEROSCOPY N/A 03/21/2021  Procedure: ENTEROSCOPY;  Surgeon: Albertina Hugger, MD;  Location: [facility] ENDOSCOPY;  Service: Gastroenterology;  Laterality: N/A;  ENTEROSCOPY N/A 12/20/2021  Procedure: ENTEROSCOPY;  Surgeon: Albertina Hugger, MD;  Location: [facility] ENDOSCOPY;  Service: Gastroenterology;  Laterality: N/A;  ENTEROSCOPY N/A 02/26/2024  Procedure: ENTEROSCOPY;  Surgeon: Nannette Babe, MD;  Location: [facility] ENDOSCOPY;  Service: Gastroenterology;  Laterality: N/A;  ENTEROSCOPY N/A 02/28/2024  Procedure: ENTEROSCOPY;  Surgeon: Annis Kinder, DO;  Location: [facility] ENDOSCOPY;  Service: Gastroenterology;  Laterality: N/A;  ESOPHAGOGASTRODUODENOSCOPY (EGD) WITH PROPOFOL  N/A 03/18/2021  Procedure: ESOPHAGOGASTRODUODENOSCOPY (EGD) WITH PROPOFOL ;  Surgeon: Albertina Hugger, MD;  Location: [facility] ENDOSCOPY;  Service: Gastroenterology;  Laterality: N/A;  GIVENS CAPSULE STUDY N/A 03/19/2021  Procedure: GIVENS CAPSULE STUDY;  Surgeon: Albertina Hugger, MD;  Location: [facility] ENDOSCOPY;  Service: Gastroenterology;  Laterality: N/A;  GIVENS CAPSULE STUDY N/A 02/29/2024  Procedure: IMAGING PROCEDURE, GI TRACT, INTRALUMINAL, VIA CAPSULE;  Surgeon: Annis Kinder, DO;  Location: [facility] ENDOSCOPY;  Service: Gastroenterology;  Laterality: N/A;  HEMOSTASIS CLIP PLACEMENT   12/20/2021  Procedure: HEMOSTASIS CLIP PLACEMENT;  Surgeon: Albertina Hugger, MD;  Location: [facility] ENDOSCOPY;  Service: Gastroenterology;;  HOT HEMOSTASIS N/A 03/21/2021  Procedure: HOT HEMOSTASIS (ARGON PLASMA COAGULATION/BICAP);  Surgeon: Albertina Hugger, MD;  Location: [facility] ENDOSCOPY;  Service: Gastroenterology;  Laterality: N/A;  HOT HEMOSTASIS N/A 12/20/2021  Procedure: HOT HEMOSTASIS (ARGON PLASMA COAGULATION/BICAP);  Surgeon: Albertina Hugger, MD;  Location: [facility] ENDOSCOPY;  Service: Gastroenterology;  Laterality: N/A;  HYSTEROSCOPY    LOWER EXTREMITY ANGIOGRAPHY N/A 02/03/2024  Procedure: Lower Extremity Angiography;  Surgeon: Avanell Leigh, MD;  Location: [facility] INVASIVE CV LAB;  Service: Cardiovascular;  Laterality: N/A;  POLYPECTOMY  03/18/2021  Procedure: POLYPECTOMY;  Surgeon: Albertina Hugger, MD;  Location: [facility] ENDOSCOPY;  Service: Gastroenterology;; Amil Kale, M.A., CCC-SLP Speech Language Pathology, Acute Rehabilitation Services Secure Chat preferred [phone] 03/05/2024, 11:46 AM  DG Shoulder Left Result Date: 03/04/2024 CLINICAL DATA:  Left shoulder pain. EXAM: LEFT SHOULDER - 2+ VIEW COMPARISON:  None Available. FINDINGS: The glenohumeral and AC joints are maintained. Minimal degenerative changes for the patient's age. No acute bony findings. No bone lesions. The visualized left ribs are intact and the visualized left lung is grossly clear. IMPRESSION: Minimal degenerative changes but no acute bony findings. Electronically Signed   By: Marrian Siva M.D.   On: 03/04/2024 17:29    Assessment/Plan: Diagnosis: 83 yo female with debility due to upper GI bleed and cardiac arrest with complicated hospital stay. Pt also appears to have left rotator cuff injury. Does the need for close, 24 hr/day medical supervision in concert with the patient's rehab needs make it unreasonable for this patient to be served in a less intensive setting? Yes Co-Morbidities requiring supervision/potential  complications:  -anemia -ESRD on hemodialysis -labile diabetes -hyponatremia -chest wall and left shoulder pain mgt -hx of MVR/CHF Due to bladder management, bowel management, safety, skin/wound care, disease management, medication administration, pain management, and patient education, does the patient require 24 hr/day rehab nursing? Yes Does the patient require coordinated care of a physician, rehab nurse, therapy disciplines of PT, OT to address physical and functional deficits in the context of the above medical diagnosis(es)? Yes Addressing deficits in the following areas: balance, endurance, locomotion, strength, transferring, bowel/bladder control, bathing, dressing, feeding, grooming, toileting, and psychosocial support Can the patient actively participate in an intensive therapy program of at least 3 hrs of therapy per day at least 5 days per week? Yes and Potentially The potential for patient to make measurable gains while on inpatient rehab is excellent Anticipated functional outcomes upon discharge from inpatient rehab are supervision  with PT, supervision and min assist with OT, n/a with SLP. Estimated rehab length of stay to reach the above functional goals is: 10-13 days Anticipated discharge destination: Home Overall Rehab/Functional Prognosis: excellent  POST ACUTE RECOMMENDATIONS: This patient's condition is appropriate for continued rehabilitative care in the following setting: CIR Patient has agreed to participate in recommended program. Yes Note that insurance prior authorization may be required for reimbursement for recommended care.  Comment: Pt was mod I until recent slew of medical issues. Has supportive family and is motivated, herself, to regain functional mobility. Rehab Admissions Coordinator to follow up.     MEDICAL RECOMMENDATIONS:  Pt benefits from heat to left shoulder. She also might do well with a muscle rub.  Therapy to address scapulo-humeral rhythm  and ROM as well as strengthening.    I have personally performed a face to face diagnostic evaluation of this patient. Additionally, I have examined the patient's medical record including any pertinent labs and radiographic images.    Thanks,  Rawland Caddy, MD 03/06/2024

## 2024-03-06 NOTE — Progress Notes (Signed)
 Occupational Therapy Treatment Patient Details Name: Anna Carr MRN: [id number] DOB: Feb 13, 1941 Today's Date: 03/06/2024   History of present illness 83 yo female who presents 5/2 with a few days of worsening weakness and admitted for concern for UGI bleed. 5/5 small bowel endoscopy. 5/8 endoscopy where pt had cardiac arrest requiring brief CPR with broken ribs. 5/8 admit to ICU. Multiple prior falls. PMH: ESRD on HD TThS, T2DM, nonrheumatic mitral valve regurgitation, HFpEF, advanced PVD, anemia of chronic disease, and hx of duodenal/jejunal angioectasias in 2022.   OT comments  Pt is progressing towards OT goals this session, demonstrating increased activity tolerance and very motivated to participate in functional activities today. Pain in L shoulder is new - Rehab MD present and evaluating when OT arrived. Pt demonstrating ROM WFL - but painful especially with abduction and resisted Adduction. Pt able to complete toilet transfer (mod A to stand from lower surface), and standing grooming today for approx 15 min with seated rest breaks x3, in room mobility, continues to require mod to max A for LB ADL, generalized weakness and will be able to tolerate 3 hours therapy daily and requires post-acute rehab to maximize safety and independence in ADL and functional transfers.       If plan is discharge home, recommend the following:  Two people to help with walking and/or transfers;Two people to help with bathing/dressing/bathroom;Assistance with cooking/housework;Assist for transportation;Help with stairs or ramp for entrance   Equipment Recommendations  BSC/3in1;Tub/shower seat    Recommendations for Other Services      Precautions / Restrictions Precautions Precautions: Fall;Other (comment) Recall of Precautions/Restrictions: Intact Precaution/Restrictions Comments: rib fxs, L shoulder tender/sore Restrictions Weight Bearing Restrictions Per Provider Order: No       Mobility Bed  Mobility               General bed mobility comments: OOB in recliner at beginning and end of session    Transfers Overall transfer level: Needs assistance Equipment used: Rolling walker (2 wheels) Transfers: Sit to/from Stand Sit to Stand: Min assist, Mod assist           General transfer comment: Pt able to push from recliner arm rests, pulled up and required mod A for boost from low toilet     Balance Overall balance assessment: History of Falls, Needs assistance Sitting-balance support: No upper extremity supported, Feet supported Sitting balance-Leahy Scale: Fair     Standing balance support: Bilateral upper extremity supported, During functional activity Standing balance-Leahy Scale: Poor Standing balance comment: unsteady in unsupported standing                           ADL either performed or assessed with clinical judgement   ADL Overall ADL's : Needs assistance/impaired     Grooming: Wash/dry hands;Wash/dry face;Oral care;Contact guard assist;Cueing for safety;Standing Grooming Details (indicate cue type and reason): x 3 seated rest breaks                 Toilet Transfer: Minimal assistance;Cueing for safety;Cueing for sequencing;Ambulation;Rolling walker (2 wheels);Regular Teacher, adult education Details (indicate cue type and reason): walked into bathroom Toileting- Clothing Manipulation and Hygiene: Moderate assistance;Sit to/from stand Toileting - Clothing Manipulation Details (indicate cue type and reason): peri care in standing with warm wash cloth     Functional mobility during ADLs: Minimal assistance;Cueing for sequencing;Rolling walker (2 wheels);Cueing for safety General ADL Comments: LUE impacted, decreased activity tolerance, generalized weakness  Extremity/Trunk Assessment Upper Extremity Assessment Upper Extremity Assessment: Right hand dominant;LUE deficits/detail LUE Deficits / Details: Pt complaining of LUE shoulder  pain with movement and resistance movements. Functionally able to complete ROM WFL for FF, Aduction, ER, horizontal AB/ADDuction LUE: Shoulder pain with ROM LUE Sensation: WNL   Lower Extremity Assessment Lower Extremity Assessment: Defer to PT evaluation        Vision   Vision Assessment?: Vision impaired- to be further tested in functional context Additional Comments: Pt stating that she cannot see anything most of the time and needs to go to eye MD - since January. However she was able to navigate with RW and find gooming items without cues. Reports that she has a cataract in R eye   Perception     Praxis     Communication Communication Communication: Impaired Factors Affecting Communication: Hearing impaired   Cognition Arousal: Alert Behavior During Therapy: WFL for tasks assessed/performed Cognition: Cognition impaired     Awareness: Intellectual awareness intact, Online awareness impaired     Executive functioning impairment (select all impairments): Sequencing, Reasoning, Problem solving OT - Cognition Comments: Pt with decreased safety awareness, pleasant and cooperative                 Following commands: Intact        Cueing   Cueing Techniques: Verbal cues  Exercises      Shoulder Instructions       General Comments Dqughter Anna Carr present throughout session and supportive/encouraging    Pertinent Vitals/ Pain       Pain Assessment Pain Assessment: Faces Faces Pain Scale: Hurts little more Pain Location: L shoulder, chest Pain Descriptors / Indicators: Guarding, Sharp, Discomfort Pain Intervention(s): Monitored during session, Repositioned, Heat applied  Home Living                                          Prior Functioning/Environment              Frequency  Min 2X/week        Progress Toward Goals  OT Goals(current goals can now be found in the care plan section)  Progress towards OT goals: Progressing  toward goals  Acute Rehab OT Goals Patient Stated Goal: be able to do for herself again OT Goal Formulation: With patient/family Time For Goal Achievement: 03/17/24 Potential to Achieve Goals: Good  Plan      Co-evaluation                 AM-PAC OT "6 Clicks" Daily Activity     Outcome Measure   Help from another person eating meals?: None Help from another person taking care of personal grooming?: A Little Help from another person toileting, which includes using toliet, bedpan, or urinal?: A Lot Help from another person bathing (including washing, rinsing, drying)?: A Lot Help from another person to put on and taking off regular upper body clothing?: A Little Help from another person to put on and taking off regular lower body clothing?: A Lot 6 Click Score: 16    End of Session Equipment Utilized During Treatment: Rolling walker (2 wheels);Gait belt  OT Visit Diagnosis: Unsteadiness on feet (R26.81);Other abnormalities of gait and mobility (R26.89);Muscle weakness (generalized) (M62.81);History of falling (Z91.81);Pain Pain - Right/Left: Left Pain - part of body: Shoulder (and chest)   Activity Tolerance Patient tolerated treatment well   Patient  Left in chair;with call bell/phone within reach;with chair alarm set   Nurse Communication Mobility status        Time: 1131-1203 OT Time Calculation (min): 32 min  Charges: OT General Charges $OT Visit: 1 Visit OT Treatments $Self Care/Home Management : 8-22 mins $Therapeutic Activity: 8-22 mins  [place] OTR/L Acute Rehabilitation Services Office: [phone]  Ebony Goldstein [facility] 03/06/2024, 1:14 PM

## 2024-03-06 NOTE — Progress Notes (Signed)
 Physical Therapy Treatment Patient Details Name: Anna Carr MRN: [id number] DOB: 1941-08-14 Today's Date: 03/06/2024   History of Present Illness 83 yo female who presents 5/2 with a few days of worsening weakness and admitted for concern for UGI bleed. 5/5 small bowel endoscopy. 5/8 endoscopy where pt had cardiac arrest requiring brief CPR with broken ribs. 5/8 admit to ICU. Multiple prior falls. PMH: ESRD on HD TThS, T2DM, nonrheumatic mitral valve regurgitation, HFpEF, advanced PVD, anemia of chronic disease, and hx of duodenal/jejunal angioectasias in 2022.    PT Comments  Pt received in bed, does endorse some L shoulder pain, inferior aspect of shoulder but pt can elevate L arm without pain or shrugging. Seems to have most pain with abduction and resisted adduction. Pt was able to mobilize keeping LUE close to self which minimized pain. Pt needed min A to come to EOB on L and stand. Ambulated with RW and min A. Expect she would be able to tolerate CIR level therapies at this point. PT will continue to follow.     If plan is discharge home, recommend the following: A lot of help with walking and/or transfers;A lot of help with bathing/dressing/bathroom;Assistance with cooking/housework;Assist for transportation;Help with stairs or ramp for entrance   Can travel by private vehicle        Equipment Recommendations  None recommended by PT    Recommendations for Other Services Rehab consult     Precautions / Restrictions Precautions Precautions: Fall;Other (comment) Recall of Precautions/Restrictions: Intact Precaution/Restrictions Comments: rib fxs Restrictions Weight Bearing Restrictions Per Provider Order: No     Mobility  Bed Mobility Overal bed mobility: Needs Assistance Bed Mobility: Rolling, Sidelying to Sit Rolling: Supervision Sidelying to sit: Min assist       General bed mobility comments: pt able to roll to R with supervision, HHA given with transition from  SL to sitting and pt instructed to keep LUE close to body as pain occurs when she abducts and pushes down.    Transfers Overall transfer level: Needs assistance Equipment used: Rolling walker (2 wheels) Transfers: Sit to/from Stand Sit to Stand: Min assist           General transfer comment: pt's familiar pattern is to put hands on RW and push through handles to stand up, however, cued to leave LUE down so it remains close to body with sit>stand as she reports that pain is aggravated with resisted adduction    Ambulation/Gait Ambulation/Gait assistance: Contact guard assist Gait Distance (Feet): 30 Feet Assistive device: Rolling walker (2 wheels) Gait Pattern/deviations: Step-through pattern, Decreased stride length, Drifts right/left, Staggering left Gait velocity: dec Gait velocity interpretation: <1.8 ft/sec, indicate of risk for recurrent falls   General Gait Details: no increased shoulder pain while holding RW. Needs a rest at 5'   Stairs             Wheelchair Mobility     Tilt Bed    Modified Rankin (Stroke Patients Only)       Balance Overall balance assessment: History of Falls, Needs assistance Sitting-balance support: No upper extremity supported, Feet supported Sitting balance-Leahy Scale: Fair     Standing balance support: Bilateral upper extremity supported, During functional activity Standing balance-Leahy Scale: Poor Standing balance comment: unsteady in unsupported standing                            Communication Communication Communication: Impaired Factors Affecting Communication: Hearing impaired  Cognition Arousal: Alert Behavior During Therapy: WFL for tasks assessed/performed   PT - Cognitive impairments: No apparent impairments                       PT - Cognition Comments: decreased insight into limitations Following commands: Intact      Cueing Cueing Techniques: Verbal cues  Exercises Other  Exercises Other Exercises: L elbow flexion then shoulder elevation x5    General Comments        Pertinent Vitals/Pain Pain Assessment Pain Assessment: Faces Faces Pain Scale: Hurts little more Pain Location: L shoulder, chest Pain Descriptors / Indicators: Guarding, Sharp, Discomfort Pain Intervention(s): Limited activity within patient's tolerance, Monitored during session    Home Living                          Prior Function            PT Goals (current goals can now be found in the care plan section) Acute Rehab PT Goals Patient Stated Goal: to need less help when moving PT Goal Formulation: With patient/family Time For Goal Achievement: 03/17/24 Potential to Achieve Goals: Good Progress towards PT goals: Progressing toward goals    Frequency    Min 3X/week      PT Plan      Co-evaluation              AM-PAC PT "6 Clicks" Mobility   Outcome Measure  Help needed turning from your back to your side while in a flat bed without using bedrails?: A Little Help needed moving from lying on your back to sitting on the side of a flat bed without using bedrails?: A Little Help needed moving to and from a bed to a chair (including a wheelchair)?: A Lot Help needed standing up from a chair using your arms (e.g., wheelchair or bedside chair)?: A Lot Help needed to walk in hospital room?: A Lot Help needed climbing 3-5 steps with a railing? : Total 6 Click Score: 13    End of Session   Activity Tolerance: Patient tolerated treatment well Patient left: with call bell/phone within reach;in chair;with chair alarm set Nurse Communication: Mobility status PT Visit Diagnosis: Unsteadiness on feet (R26.81);Other abnormalities of gait and mobility (R26.89);Muscle weakness (generalized) (M62.81);History of falling (Z91.81);Difficulty in walking, not elsewhere classified (R26.2)     Time: 1610-9604 PT Time Calculation (min) (ACUTE ONLY): 17 min  Charges:     $Gait Training: 8-22 mins PT General Charges $$ ACUTE PT VISIT: 1 Visit                     Amey Ka, PT  Acute Rehab Services Secure chat preferred Office [phone]    Deloris Fetters Lemuel Boodram 03/06/2024, 12:09 PM

## 2024-03-06 NOTE — TOC Progression Note (Signed)
 Transition of Care [facility]) - Progression Note    Patient Details  Name: Anna Carr MRN: [id number] Date of Birth: 11/29/1940  Transition of Care [facility]) CM/SW Contact  Tom-Johnson, Danna Sewell Daphne, RN Phone Number: 03/06/2024, 10:51 AM  Clinical Narrative:     CIR following for candidacy to admit.   CM will continue to follow as patient progresses with care towards discharge.       Expected Discharge Plan: IP Rehab Facility Barriers to Discharge: Continued Medical Work up, English as a second language teacher  Expected Discharge Plan and Services       Living arrangements for the past 2 months: Single Family Home                                       Social Determinants of Health (SDOH) Interventions SDOH Screenings   Food Insecurity: No Food Insecurity (02/26/2024)  Housing: Low Risk  (02/26/2024)  Transportation Needs: No Transportation Needs (02/26/2024)  Utilities: Not At Risk (02/26/2024)  Financial Resource Strain: Low Risk  (10/27/2021)   Received from [facility], [facility]  Social Connections: Socially Isolated (03/04/2024)  Stress: No Stress Concern Present (10/27/2021)   Received from [facility], [facility]  Tobacco Use: Medium Risk (03/02/2024)    Readmission Risk Interventions     No data to display

## 2024-03-06 NOTE — Progress Notes (Signed)
 Inpatient Rehab Admissions Coordinator:    CIR following. Rehab MD to consult today to weigh on on tolerance/candidacy for CIR admit. I will follow up after.   Wandalee Gust, MS, CCC-SLP Rehab Admissions Coordinator  [phone] (celll) [phone] (office)

## 2024-03-06 NOTE — Progress Notes (Signed)
 [facility] Progress Note   Subjective:    Seen in room. She is reports some chest pain from CPR. She is wearing a lidocaine  patch above her sternum. No reported dyspnea or CP today. Last HD 03/04/24 with 2.5 UF.   Objective Vitals:   03/05/24 2044 03/06/24 0518 03/06/24 0603 03/06/24 0842  BP: (!) 128/47 125/64  (!) 112/46  Pulse: 85 82  85  Resp: 18 19  18   Temp: 98.1 F (36.7 C) 97.9 F (36.6 C)  97.9 F (36.6 C)  TempSrc: Oral Oral    SpO2: 95% 96%  93%  Weight:   73.8 kg   Height:       Physical Exam General: Sitting up in bed, nad  Heart: RRR, Lungs: Clear bilaterally  Abdomen: Soft, non-distended Extremities: no LE edema  Dialysis Access: LUE AVG +bruit   Filed Weights   03/04/24 1541 03/05/24 0717 03/06/24 0603  Weight: 72.9 kg 72.9 kg 73.8 kg    Intake/Output Summary (Last 24 hours) at 03/06/2024 1248 Last data filed at 03/06/2024 0600 Gross per 24 hour  Intake 840 ml  Output 0 ml  Net 840 ml    Additional Objective Labs: Basic Metabolic Panel: Recent Labs  Lab 03/04/24 0757 03/05/24 0500 03/06/24 0426  NA 124* 129* 128*  K 4.7 4.4 4.0  CL 83* 91* 89*  CO2 26 26 27   GLUCOSE 174* 226* 96  BUN 42* 25* 38*  CREATININE 4.75* 3.42* 5.07*  CALCIUM  9.0 9.1 8.8*  PHOS 4.6 3.8 4.1   Liver Function Tests: Recent Labs  Lab 03/02/24 1132 03/03/24 0500 03/04/24 0757 03/05/24 0500 03/06/24 0426  AST 74*  --   --   --   --   ALT 52*  --   --   --   --   ALKPHOS 57  --   --   --   --   BILITOT 1.1  --   --   --   --   PROT 4.8*  --   --   --   --   ALBUMIN  2.7*   < > 2.7* 2.7* 2.4*   < > = values in this interval not displayed.   Recent Labs  Lab 03/02/24 1132  LIPASE 66*  AMYLASE 29   CBC: Recent Labs  Lab 03/03/24 0500 03/03/24 1606 03/04/24 0757 03/05/24 0500 03/06/24 0426  WBC 8.0 10.4 7.6 7.3 6.2  HGB 9.1* 9.5* 9.4* 9.4* 8.3*  HCT 27.1* 28.5* 28.1* 28.7* 25.6*  MCV 97.8 100.7* 99.3 102.5* 103.2*  PLT 141* 165  177 162 153   Blood Culture    Component Value Date/Time   SDES BLOOD RIGHT HAND 03/16/2021 0046   SPECREQUEST  03/16/2021 0046    BOTTLES DRAWN AEROBIC ONLY Blood Culture results may not be optimal due to an inadequate volume of blood received in culture bottles   CULT  03/16/2021 0046    NO GROWTH 5 DAYS Performed at [facility], [address], [place], [place] [address]    REPTSTATUS 03/21/2021 FINAL 03/16/2021 0046    Cardiac Enzymes: No results for input(s): "CKTOTAL", "CKMB", "CKMBINDEX", "TROPONINI" in the last 168 hours. CBG: Recent Labs  Lab 03/05/24 0719 03/05/24 1652 03/05/24 2041 03/06/24 0733 03/06/24 1117  GLUCAP 204* 185* 257* 98 172*   Iron  Studies: No results for input(s): "IRON ", "TIBC", "TRANSFERRIN", "FERRITIN" in the last 72 hours. Lab Results  Component Value Date   INR 1.4 (H) 03/02/2024  INR 1.0 03/29/2021     Medications:  anticoagulant sodium citrate        acetaminophen   1,000 mg Oral Q8H   atorvastatin   10 mg Oral Daily   Chlorhexidine  Gluconate Cloth  6 each Topical Q0600   diclofenac Sodium  4 g Topical QID   feeding supplement  237 mL Oral BID BM   ferric citrate   210 mg Oral TID WC   furosemide   80 mg Oral Daily   insulin  aspart  0-5 Units Subcutaneous QHS   insulin  aspart  0-6 Units Subcutaneous TID WC   insulin  glargine-yfgn  6 Units Subcutaneous Daily   lidocaine   1 patch Transdermal Q24H   pantoprazole   40 mg Oral BID   polyethylene glycol  17 g Oral Daily   senna-docusate  1 tablet Oral QHS    Dialysis Orders: [place] [facility] 3.5h   B400    70.6kg    LUA AVG   Heparin  none Last OP HD 5/01, post wt 70.9 Comes off 0-2kg over, bp's wnl on HD Mircera 200mcg IV q 2 weeks- last dose 02/24/24  Assessment/Plan: 83 year old woman with ESRD on HD who presented with acute blood loss anemia, found to have GI bleed on capsule endoscopy.  Underwent EGD 5/8.  EGD was complicated by bradycardic arrest. She was transferred to the  ICU postprocedure.   Symptomatic anemia/GI bleed.  Hb 6.6 on admission. 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 GI recommends against future endoscopy d/t procedural bradycardia. Supportive management going forward. Serial H/H. Transfuse prn Hgb <7.  Cardiac arrest:  2/2 hypotension and bradycardia. S/p brief CPR with rib fractures. Now extubated.  Hyponatremia: chronic mild hypoNa high 120s low 130s. Improved with HD yesterday   ESRD: on HD TTS.  Next HD 5/13 HTN:  BP stable Volume: euvolemic after HD Anemia of esrd + ABLA:  s/p 2 units PRBC 5/2. ESA given 02/24/24 --Next due 5/15.  Hb 9s Secondary hyperparathyroidism: Cor Ca and phos in range, cont binders ac tid.  IDDM: per pmd Generalized weakness. Looks like being evaluated for CIR. Continue protein supplements. Alb 2.4   Anna Lorenzo, NP [facility] 03/06/2024,12:48 PM

## 2024-03-06 NOTE — Progress Notes (Signed)
 Inpatient Rehab Admissions Coordinator:    Pt. Participating better today and rehab MD feels Pt. Is a good candidate. I will send case to insurance and pursue for admit.   Wandalee Gust, MS, CCC-SLP Rehab Admissions Coordinator  [phone] (celll) [phone] (office)

## 2024-03-06 NOTE — Progress Notes (Addendum)
 HD#5 SUBJECTIVE:  Patient Summary: Anna Carr is a 83 y.o. with a pertinent PMH of ESRD on HD TThS, T2DM, nonrheumatic mitral valve regurgitation, HFpEF, advanced PVD, anemia of chronic disease, and hx of duodenal/jejunal angioectasias in 2022 who presented with weakness and melenic stool and admitted for concern for UGI bleed.   She experienced brief cardiac arrest during EGD on 03/02/24 and was subsequently intubated and transferred to ICU. Extubated later on 5/8 and IMTS assumed care on medical floor on 5/10.  Overnight Events and Interim History: Pain is improving, still intermittent at shoulder and chest. Eating and drinking well. No recent BM, no blood per rectum.  Significant Hospital Events:  5/8 Cardiac arrest during EGD on 5/8, intubated in ICU 5/8 Extubated 5/10 Returned to floor care  OBJECTIVE:  Vital Signs: Vitals:   03/05/24 2044 03/06/24 0518 03/06/24 0603 03/06/24 0842  BP: (!) 128/47 125/64  (!) 112/46  Pulse: 85 82  85  Resp: 18 19  18   Temp: 98.1 F (36.7 C) 97.9 F (36.6 C)  97.9 F (36.6 C)  TempSrc: Oral Oral    SpO2: 95% 96%  93%  Weight:   73.8 kg   Height:       Supplemental O2: Room Air SpO2: 93 % O2 Flow Rate (L/min): 2 L/min FiO2 (%): 50 %  Filed Weights   03/04/24 1541 03/05/24 0717 03/06/24 0603  Weight: 72.9 kg 72.9 kg 73.8 kg     Intake/Output Summary (Last 24 hours) at 03/06/2024 1116 Last data filed at 03/06/2024 0600 Gross per 24 hour  Intake 840 ml  Output 0 ml  Net 840 ml   Net IO Since Admission: -1,133.08 mL [03/06/24 1116]  Physical Exam: Physical Exam Constitutional:      General: She is not in acute distress.    Appearance: She is not ill-appearing.  Cardiovascular:     Rate and Rhythm: Normal rate and regular rhythm.     Pulses: Normal pulses.  Abdominal:     General: Abdomen is flat. Bowel sounds are normal.     Tenderness: There is no abdominal tenderness. There is no guarding.  Musculoskeletal:         General: Tenderness present. No swelling or deformity.     Comments: L shoulder pain localized to L medial scapular area with mild TTP, no swelling or deformity, not acutely warm or cool. She uses the arm with limited ROM for eating and self care. Less pain and wincing today vs yesterday. Skin:    General: Skin is warm and dry.  Neurological:     General: No focal deficit present.     Mental Status: She is alert.   Patient Lines/Drains/Airways Status     Active Line/Drains/Airways     Name Placement date Placement time Site Days   Peripheral IV 03/05/24 20 G 1.88" Right;Anterior Forearm 03/05/24  1106  Forearm  1   Fistula / Graft Left Upper arm Arteriovenous vein graft 03/12/21  1254  Upper arm  1090   Wound / Incision (Open or Dehisced) 02/26/24 Other (Comment) Knee Right;Left;Anterior bilateral knee abrasion 02/26/24  1900  Knee  9            ASSESSMENT/PLAN:  Assessment: Principal Problem:   Acute on chronic anemia Active Problems:   Type 2 diabetes mellitus with chronic kidney disease on chronic dialysis, with long-term current use of insulin  ([name])   ESRD on dialysis ([name])   Angiodysplasia of small intestine   Symptomatic  anemia   Cardiac arrest [facility])   Musculoskeletal chest pain   AVM (arteriovenous malformation) of duodenum, acquired   Acute upper GI bleed   Acute pain of left shoulder  Anna Carr is a 83 y.o. with a pertinent PMH of ESRD on HD TThS, T2DM, nonrheumatic mitral valve regurgitation, HFpEF, advanced PVD, anemia of chronic disease, and hx of duodenal/jejunal angioectasias in 2022 who presented with weakness and melenic stool and admitted for concern for UGI bleed on hospital day 5.  She experienced brief cardiac arrest during EGD on 03/02/24 and was subsequently intubated and transferred to ICU. Extubated later on 5/8 and IMTS assumed care on medical floor on 5/10.  She is medically stable and discharge planning underway.  Plan: Acute on Chronic  Anemia from Acute Upper GI bleed Hx of duodenal/jejunal angioectasias Hx Diverticulosis (2022)  This was her presenting issue. Slight drop in Hg today below 9, no evidence of bleeding, perhaps dilutional. EGD on 5/8 with argon plasma coagulation of non bleeding AVMs in the duodenum, otherwise no ulcers identified. Without gross blood per rectum. Pt chronically has black stools. - transfusion goal > 7 - Will likely resume her aspirin  for PAD but would stop for good if she bleeds again in future - High dose PPI BID - Dys diet diet - GI following peripherally, thank you - continue to work with PT/OT for strength and endurance - Will look into iron  infusion to rebuild, particularly useful given nutritional status and contraindications to future procedural intervention - Medically stable, pending placement  L shoulder pain, musculoskeletal Etiology from moving herself in bed, possible influence from her recent CPR. Quite painful over the weekend but improving. She has tenderness at the medical L scapula but no other acute findings. XR unrevealing.  - Voltaren Gel & tylenol  & oxycodone  available for breakthrough, aquathermia, robaxin, encouraged gentle exercise - Request PT/OT work, need to consider this in light of plan for CIR vs SNF discharge  Recent Cardiac Arrest during EGD with CPR Chest main MSK from CPR Likely vagal with intra-procedural bradycardia and arrest. Short duration of CPR until ROSC. Spent two nights in ICU, was extubated 5/8 and transferred 5/10. Pt no longer candidate for endoscopy. - lidocaine  patch, tylenol , oxycodone  prn for chest wall pain - At risk for aspiration, will monitor respiratory status, which continues to be stable  Acute on Chronic Hyponatremia Improved with return to diet, potentially due to recently being NPO and ESRD. Baseline 127-130 range. No symptoms. - continue to monitor with metabolic panel  - Ensure feeding supplement  Stable Issues  ESRD on HD  TThS Resume dialysis schedule. Monitor fluid balance and electrolytes. - No piccs  T2DM Patient was hypoglycemic earlier in hospitalization and so maintenance insulin  stopped. Will resume at lower dose as sugars are trending above 200. -continue to monitor glucose, continue SSI -A1c below 6, on 12 semglee  PTH, restarted at 6 units daily  HFpEF Stable, not in exacerbation. Last echo 12/2023 with 60-65 EF and normal function of the LV. Has moderate to severe MR. No aortic stenosis.  - Continue lasix  and resume coreg  near discharge - Daily weights, I&Os. Net -1.1L since admit due to HD   Best Practice: Diet: Dys 2  IVF: n/a VTE: SCDs Start: 03/02/24 1129 SCDs Start: 02/25/24 1331 Code: Full AB: n/a Pain Medicine: Tylenol , oxycodone  5 q4 prn, lidocaine  patch, aquathermia Bowel Regimen: Miralax  daily, senokot  Therapy Recs: CIR vs SNF DISPO: Anticipated discharge in 1-3 days to CIR vs  SNF pending evaluation/bed availability.   Signature: Carleen Chary, D.O.  Internal Medicine Resident, PGY-1 [facility]  Pager: # [phone]. 11:16 AM, 03/06/2024

## 2024-03-07 DIAGNOSIS — K922 Gastrointestinal hemorrhage, unspecified: Secondary | ICD-10-CM | POA: Diagnosis not present

## 2024-03-07 DIAGNOSIS — D62 Acute posthemorrhagic anemia: Secondary | ICD-10-CM | POA: Diagnosis not present

## 2024-03-07 LAB — RENAL FUNCTION PANEL
Albumin: 2.5 g/dL — ABNORMAL LOW (ref 3.5–5.0)
Anion gap: 12 (ref 5–15)
BUN: 52 mg/dL — ABNORMAL HIGH (ref 8–23)
CO2: 25 mmol/L (ref 22–32)
Calcium: 9 mg/dL (ref 8.9–10.3)
Chloride: 90 mmol/L — ABNORMAL LOW (ref 98–111)
Creatinine, Ser: 6.15 mg/dL — ABNORMAL HIGH (ref 0.44–1.00)
GFR, Estimated: 6 mL/min — ABNORMAL LOW (ref >60)
Glucose, Bld: 148 mg/dL — ABNORMAL HIGH (ref 70–99)
Phosphorus: 4.3 mg/dL (ref 2.5–4.6)
Potassium: 5 mmol/L (ref 3.5–5.1)
Sodium: 127 mmol/L — ABNORMAL LOW (ref 135–145)

## 2024-03-07 LAB — GLUCOSE, CAPILLARY
Glucose-Capillary: 141 mg/dL — ABNORMAL HIGH (ref 70–99)
Glucose-Capillary: 131 mg/dL — ABNORMAL HIGH (ref 70–99)
Glucose-Capillary: 249 mg/dL — ABNORMAL HIGH (ref 70–99)
Glucose-Capillary: 347 mg/dL — ABNORMAL HIGH (ref 70–99)
Glucose-Capillary: 314 mg/dL — ABNORMAL HIGH (ref 70–99)

## 2024-03-07 LAB — HEMOGLOBIN AND HEMATOCRIT, BLOOD
HCT: 28.2 % — ABNORMAL LOW (ref 36.0–46.0)
Hemoglobin: 9.2 g/dL — ABNORMAL LOW (ref 12.0–15.0)

## 2024-03-07 MED ORDER — ALBUTEROL SULFATE (2.5 MG/3ML) 0.083% IN NEBU
2.5000 mg | INHALATION_SOLUTION | Freq: Four times a day (QID) | RESPIRATORY_TRACT | Status: DC
  Administered 2024-03-07 (×2): 2.5 mg via RESPIRATORY_TRACT
  Filled 2024-03-07 (×2): qty 3

## 2024-03-07 MED ORDER — LIDOCAINE-PRILOCAINE 2.5-2.5 % EX CREA: 1.0000 | TOPICAL_CREAM | CUTANEOUS | Status: DC | PRN

## 2024-03-07 MED ORDER — PENTAFLUOROPROP-TETRAFLUOROETH EX AERO: 1.0000 | INHALATION_SPRAY | CUTANEOUS | Status: DC | PRN

## 2024-03-07 MED ORDER — ALBUTEROL SULFATE (2.5 MG/3ML) 0.083% IN NEBU: 2.5000 mg | INHALATION_SOLUTION | Freq: Four times a day (QID) | RESPIRATORY_TRACT | Status: DC | PRN

## 2024-03-07 MED ORDER — HEPARIN SODIUM (PORCINE) 1000 UNIT/ML DIALYSIS: 1000.0000 [IU] | INTRAMUSCULAR | Status: DC | PRN

## 2024-03-07 MED ORDER — DARBEPOETIN ALFA 100 MCG/0.5ML IJ SOSY
100.0000 ug | PREFILLED_SYRINGE | INTRAMUSCULAR | Status: DC
  Administered 2024-03-09: 100 ug via SUBCUTANEOUS
  Filled 2024-03-07: qty 0.5

## 2024-03-07 MED ORDER — LIDOCAINE HCL (PF) 1 % IJ SOLN: 5.0000 mL | INTRAMUSCULAR | Status: DC | PRN

## 2024-03-07 NOTE — Progress Notes (Signed)
 Inpatient Rehab Admissions Coordinator:    CIR following, case pending with insurance.   Wandalee Gust, MS, CCC-SLP Rehab Admissions Coordinator  [phone] (celll) [phone] (office)

## 2024-03-07 NOTE — Progress Notes (Signed)
 [facility] Progress Note   Subjective:    Seen in room. Patient had successful HD today with UF of 3L. She denies any dyspnea. She is still having discomfort in her chest and shoulder. BP acceptable today. Her daughter was in the room and we were able to discuss her labs. Pt dispo is CIR pending insurance.   Objective Vitals:   03/07/24 1115 03/07/24 1243 03/07/24 1251 03/07/24 1347  BP: (!) 131/45 (!) 124/49 (!) 128/91 133/74  Pulse:    (!) 118  Resp:      Temp:   98.5 F (36.9 C) 98.3 F (36.8 C)  TempSrc:   Oral Oral  SpO2:    96%  Weight:      Height:       Physical Exam General: Sitting up in bed, nad  Heart: RRR, Lungs: Clear bilaterally  Abdomen: Soft, non-distended Extremities: no LE edema  Dialysis Access: LUE AVG +bruit   Filed Weights   03/06/24 0603 03/07/24 0406 03/07/24 0850  Weight: 73.8 kg 75.9 kg 78 kg    Intake/Output Summary (Last 24 hours) at 03/07/2024 1436 Last data filed at 03/07/2024 1251 Gross per 24 hour  Intake 360 ml  Output 3000 ml  Net -2640 ml    Additional Objective Labs: Basic Metabolic Panel: Recent Labs  Lab 03/05/24 0500 03/06/24 0426 03/07/24 0503  NA 129* 128* 127*  K 4.4 4.0 5.0  CL 91* 89* 90*  CO2 26 27 25   GLUCOSE 226* 96 148*  BUN 25* 38* 52*  CREATININE 3.42* 5.07* 6.15*  CALCIUM  9.1 8.8* 9.0  PHOS 3.8 4.1 4.3   Liver Function Tests: Recent Labs  Lab 03/02/24 1132 03/03/24 0500 03/05/24 0500 03/06/24 0426 03/07/24 0503  AST 74*  --   --   --   --   ALT 52*  --   --   --   --   ALKPHOS 57  --   --   --   --   BILITOT 1.1  --   --   --   --   PROT 4.8*  --   --   --   --   ALBUMIN  2.7*   < > 2.7* 2.4* 2.5*   < > = values in this interval not displayed.   Recent Labs  Lab 03/02/24 1132  LIPASE 66*  AMYLASE 29   CBC: Recent Labs  Lab 03/03/24 0500 03/03/24 1606 03/04/24 0757 03/05/24 0500 03/06/24 0426 03/07/24 0503  WBC 8.0 10.4 7.6 7.3 6.2  --   HGB 9.1* 9.5* 9.4* 9.4*  8.3* 9.2*  HCT 27.1* 28.5* 28.1* 28.7* 25.6* 28.2*  MCV 97.8 100.7* 99.3 102.5* 103.2*  --   PLT 141* 165 177 162 153  --    Blood Culture    Component Value Date/Time   SDES BLOOD RIGHT HAND 03/16/2021 0046   SPECREQUEST  03/16/2021 0046    BOTTLES DRAWN AEROBIC ONLY Blood Culture results may not be optimal due to an inadequate volume of blood received in culture bottles   CULT  03/16/2021 0046    NO GROWTH 5 DAYS Performed at [facility], [address], [place], [place] [address]    REPTSTATUS 03/21/2021 FINAL 03/16/2021 0046    Cardiac Enzymes: No results for input(s): "CKTOTAL", "CKMB", "CKMBINDEX", "TROPONINI" in the last 168 hours. CBG: Recent Labs  Lab 03/06/24 1117 03/06/24 1655 03/06/24 2139 03/07/24 0727 03/07/24 1342  GLUCAP 172* 250* 273* 141* 131*   Iron   Studies: No results for input(s): "IRON ", "TIBC", "TRANSFERRIN", "FERRITIN" in the last 72 hours. Lab Results  Component Value Date   INR 1.4 (H) 03/02/2024   INR 1.0 03/29/2021     Medications:     acetaminophen   1,000 mg Oral Q8H   albuterol   2.5 mg Nebulization Q6H   atorvastatin   10 mg Oral Daily   Chlorhexidine  Gluconate Cloth  6 each Topical Q0600   diclofenac Sodium  4 g Topical QID   feeding supplement  237 mL Oral BID BM   ferric citrate   210 mg Oral TID WC   furosemide   80 mg Oral Daily   insulin  aspart  0-5 Units Subcutaneous QHS   insulin  aspart  0-6 Units Subcutaneous TID WC   insulin  glargine-yfgn  6 Units Subcutaneous Daily   lidocaine   1 patch Transdermal Q24H   Muscle Rub   Topical BID   pantoprazole   40 mg Oral BID   polyethylene glycol  17 g Oral Daily   senna-docusate  1 tablet Oral QHS    Dialysis Orders: [place] [facility] 3.5h   B400    70.6kg    LUA AVG   Heparin  none Last OP HD 5/01, post wt 70.9 Comes off 0-2kg over, bp's wnl on HD Mircera 200mcg IV q 2 weeks- last dose 02/24/24  Assessment/Plan: 83 year old woman with ESRD on HD who presented with acute blood  loss anemia, found to have GI bleed on capsule endoscopy.  Underwent EGD 5/8.  EGD was complicated by bradycardic arrest. She was transferred to the ICU postprocedure.   Symptomatic anemia/GI bleed.  Hb 6.6 on admission. 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 GI recommends against future endoscopy d/t procedural bradycardia. Supportive management going forward. Serial H/H. Transfuse prn Hgb <7.  Cardiac arrest:  2/2 hypotension and bradycardia. S/p brief CPR with rib fractures. Now extubated.  Hyponatremia: chronic mild hypoNa high 120s low 130s. Improved with HD yesterday. Na 127 today.  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  Next HD 5/15 HTN:  BP stable Volume: euvolemic after HD Anemia of esrd + ABLA:  s/p 2 units PRBC 5/2. ESA given 02/24/24 --Next due 5/15.  Hb 9s.  Aranesp  ordered for 03/09/24 Secondary hyperparathyroidism: Cor Ca and phos in range, cont binders ac tid.  IDDM: per pmd Generalized weakness. Looks like being evaluated for CIR. Continue protein supplements. Alb 2.5   Hersey Lorenzo, NP [facility] 03/07/2024,2:36 PM

## 2024-03-07 NOTE — Progress Notes (Signed)
 PT Cancellation Note  Patient Details Name: RACHELANNE BASSANO MRN: [id number] DOB: 08-05-1941   Cancelled Treatment:    Reason Eval/Treat Not Completed: Patient at procedure or test/unavailable (HD). Will check back as time allows.   Amey Ka, PT  Acute Rehab Services Secure chat preferred Office [phone]    Deloris Fetters Jonice Cerra 03/07/2024, 10:03 AM

## 2024-03-07 NOTE — Progress Notes (Signed)
 HD#6 SUBJECTIVE:  Patient Summary: Anna Carr is a 83 y.o. with a pertinent PMH of ESRD on HD TThS, T2DM, nonrheumatic mitral valve regurgitation, HFpEF, advanced PVD, anemia of chronic disease, and hx of duodenal/jejunal angioectasias in 2022 who presented with weakness and melenic stool and admitted for concern for UGI bleed.    She experienced brief cardiac arrest during EGD on 03/02/24 and was subsequently intubated and transferred to ICU. Extubated later on 5/8 and IMTS assumed care on medical floor on 5/10.   Overnight Events and Interim History: Seen in dialysis. No complaints other than mild cough. No fevers, chills, dyspnea, CP, N/V, blood in stools.   Significant Hospital Events:  5/8 Cardiac arrest during EGD on 5/8, intubated in ICU 5/8 Extubated 5/10 Returned to floor care  OBJECTIVE:  Vital Signs: Vitals:   03/07/24 0851 03/07/24 0930 03/07/24 0945 03/07/24 1000  BP:  (!) 131/47 (!) 132/47 (!) 112/59  Pulse:      Resp:      Temp: 98 F (36.7 C)     TempSrc: Oral     SpO2:      Weight:      Height:       Supplemental O2: Room Air SpO2: 100 % O2 Flow Rate (L/min): 2 L/min FiO2 (%): 50 %  Filed Weights   03/06/24 0603 03/07/24 0406 03/07/24 0850  Weight: 73.8 kg 75.9 kg 78 kg     Intake/Output Summary (Last 24 hours) at 03/07/2024 1053 Last data filed at 03/06/2024 2200 Gross per 24 hour  Intake 360 ml  Output --  Net 360 ml   Net IO Since Admission: -533.08 mL [03/07/24 1053]  Physical Exam: Physical Exam Constitutional:      General: She is not in acute distress.    Appearance: She is not ill-appearing.  Cardiovascular:     Rate and Rhythm: Normal rate and regular rhythm.     Pulses: Normal pulses.  Abdominal:     General: Abdomen is flat. Bowel sounds are normal.     Tenderness: There is no abdominal tenderness. There is no guarding.  Musculoskeletal:        General: Tenderness present. No swelling or deformity.     Comments: L shoulder  pain localized to L medial scapular area with mild TTP, no swelling or deformity. Skin:    General: Skin is warm and dry.  Neurological:     General: No focal deficit present.     Mental Status: She is alert.   Patient Lines/Drains/Airways Status     Active Line/Drains/Airways     Name Placement date Placement time Site Days   Peripheral IV 03/05/24 20 G 1.88" Right;Anterior Forearm 03/05/24  1106  Forearm  2   Fistula / Graft Left Upper arm Arteriovenous vein graft 03/12/21  1254  Upper arm  1091   Wound / Incision (Open or Dehisced) 02/26/24 Other (Comment) Knee Right;Left;Anterior bilateral knee abrasion 02/26/24  1900  Knee  10             ASSESSMENT/PLAN:  Assessment: Principal Problem:   Acute on chronic anemia Active Problems:   Type 2 diabetes mellitus with chronic kidney disease on chronic dialysis, with long-term current use of insulin  ([name])   ESRD on dialysis ([name])   Angiodysplasia of small intestine   Symptomatic anemia   Cardiac arrest [facility])   Musculoskeletal chest pain   AVM (arteriovenous malformation) of duodenum, acquired   Acute upper GI bleed   Acute pain  of left shoulder  Anna Carr is a 83 y.o. with a pertinent PMH of ESRD on HD TThS, T2DM, nonrheumatic mitral valve regurgitation, HFpEF, advanced PVD, anemia of chronic disease, and hx of duodenal/jejunal angioectasias in 2022 who presented with weakness and melenic stool and admitted for concern for UGI bleed.   She experienced brief cardiac arrest during EGD on 03/02/24 and was subsequently intubated and transferred to ICU. Extubated later on 5/8 and IMTS assumed care on medical floor on 5/10.   She is medically stable and discharge planning underway.   Plan: Acute on Chronic Anemia from Acute Upper GI bleed Hx of duodenal/jejunal angioectasias Hx Diverticulosis (2022)  This was her presenting issue. Hg above 9 again and no evidence of bleeding. EGD on 5/8 with argon plasma coagulation of non  bleeding AVMs in the duodenum. Pt chronically has black stools. - transfusion goal > 7 - Will likely resume her aspirin  for PAD but would stop for good if she bleeds again in future - High dose PPI BID - Dys diet diet per speech - GI following peripherally, thank you - continue to work with PT/OT for strength and endurance - Iron  infusions - Medically stable, pending CIR insurance auth   L shoulder pain, musculoskeletal Improving. Etiology from moving herself in bed, possible influence from her recent CPR. XR unrevealing.  - Voltaren Gel & tylenol  & oxycodone  available for breakthrough, aquathermia, robaxin, encouraged gentle exercise - PT/OT   Recent Cardiac Arrest during EGD with CPR Chest main MSK from CPR Likely vagal with intra-procedural bradycardia and arrest. Short duration of CPR until ROSC. Spent two nights in ICU, was extubated 5/8 and transferred 5/10. Pt no longer candidate for endoscopy. - lidocaine  patch, tylenol , oxycodone  prn for chest wall pain - At risk for aspiration, will monitor respiratory status, which continues to be stable    Stable Issues  Chronic Hyponatremia As low as 120 but improved with return to diet, potentially due to recently being NPO and ESRD. Baseline 127-130 range. No symptoms. - Ensure feeding supplement   ESRD on HD TThS Resume dialysis schedule. Monitor fluid balance and electrolytes. - No piccs   T2DM Patient was hypoglycemic earlier in hospitalization and so maintenance insulin  stopped. Resumed at lower dose when am sugars trended above 200. -continue to monitor glucose, continue SSI -A1c below 6, on 12 semglee  PTH, restarted at 6 units daily   HFpEF Stable, not in exacerbation. Last echo 12/2023 with 60-65 EF and normal function of the LV. Has moderate to severe MR. No aortic stenosis.  - Continue lasix  and resume coreg  near discharge - Daily weights, I&Os. Net -1.1L since admit due to HD   Best Practice: Diet: Dys 2  IVF:  n/a VTE: SCDs Start: 03/02/24 1129 SCDs Start: 02/25/24 1331 Code: Full AB: n/a Pain Medicine: Tylenol , oxycodone  5 q4 prn, lidocaine  patch, aquathermia Bowel Regimen: Miralax  daily, senokot  Therapy Recs: CIR vs SNF DISPO: Anticipated discharge in 1-3 days to CIR pending insurance and bed availability.   Signature: Carleen Chary, D.O.  Internal Medicine Resident, PGY-1 [facility]  Pager: # [phone]. 10:53 AM, 03/07/2024

## 2024-03-07 NOTE — TOC Progression Note (Signed)
 Transition of Care [facility]) - Progression Note    Patient Details  Name: Anna Carr MRN: [id number] Date of Birth: 07-04-1941  Transition of Care [facility]) CM/SW Contact  Tom-Johnson, Yuri Flener Daphne, RN Phone Number: 03/07/2024, 5:06 PM  Clinical Narrative:     CM notified by CMA office that patient is denied Insurance Authorization for CIR but will approve for a lower level of care.  CM called [facility] Transition [phone]) and spoke with Avanell Bob. Avanell Bob states Peer to Peer was offered and no response by deadline. Currently, patient does not meet criteria for CIR but more appropriate for SNF. States insurance will be authorized for SNF if patient chooses. MD and LCSW notified.   TOC will continue to follow as patient progresses with care towards discharge.       Expected Discharge Plan: IP Rehab Facility Barriers to Discharge: Continued Medical Work up, English as a second language teacher  Expected Discharge Plan and Services       Living arrangements for the past 2 months: Single Family Home                                       Social Determinants of Health (SDOH) Interventions SDOH Screenings   Food Insecurity: No Food Insecurity (02/26/2024)  Housing: Low Risk  (02/26/2024)  Transportation Needs: No Transportation Needs (02/26/2024)  Utilities: Not At Risk (02/26/2024)  Financial Resource Strain: Low Risk  (10/27/2021)   Received from [facility], [facility]  Social Connections: Socially Isolated (03/04/2024)  Stress: No Stress Concern Present (10/27/2021)   Received from [facility]  Tobacco Use: Medium Risk (03/02/2024)    Readmission Risk Interventions     No data to display

## 2024-03-07 NOTE — Telephone Encounter (Signed)
 Payment taken into epic/ipay. Process completed./kbl 03/07/24

## 2024-03-07 NOTE — Progress Notes (Signed)
 Received patient in bed. Alert and oriented x 4.Consent verified.  Access used : Left avg that worked well.  Duration of treatment : 3.5 hours.  Uf goal:Met 3 liters.Tolerated treatment.  Medicine given : Tessalon  200 mg.  Hand off to the patient's nurse into her room via transporter with stable condition.

## 2024-03-07 NOTE — Plan of Care (Signed)

## 2024-03-08 DIAGNOSIS — K922 Gastrointestinal hemorrhage, unspecified: Secondary | ICD-10-CM | POA: Diagnosis not present

## 2024-03-08 DIAGNOSIS — D62 Acute posthemorrhagic anemia: Secondary | ICD-10-CM | POA: Diagnosis not present

## 2024-03-08 LAB — RENAL FUNCTION PANEL
Albumin: 2.4 g/dL — ABNORMAL LOW (ref 3.5–5.0)
Anion gap: 11 (ref 5–15)
BUN: 37 mg/dL — ABNORMAL HIGH (ref 8–23)
CO2: 26 mmol/L (ref 22–32)
Calcium: 8.6 mg/dL — ABNORMAL LOW (ref 8.9–10.3)
Chloride: 91 mmol/L — ABNORMAL LOW (ref 98–111)
Creatinine, Ser: 4.72 mg/dL — ABNORMAL HIGH (ref 0.44–1.00)
GFR, Estimated: 9 mL/min — ABNORMAL LOW (ref >60)
Glucose, Bld: 161 mg/dL — ABNORMAL HIGH (ref 70–99)
Phosphorus: 3.3 mg/dL (ref 2.5–4.6)
Potassium: 4.2 mmol/L (ref 3.5–5.1)
Sodium: 128 mmol/L — ABNORMAL LOW (ref 135–145)

## 2024-03-08 LAB — GLUCOSE, CAPILLARY
Glucose-Capillary: 143 mg/dL — ABNORMAL HIGH (ref 70–99)
Glucose-Capillary: 205 mg/dL — ABNORMAL HIGH (ref 70–99)
Glucose-Capillary: 258 mg/dL — ABNORMAL HIGH (ref 70–99)
Glucose-Capillary: 258 mg/dL — ABNORMAL HIGH (ref 70–99)

## 2024-03-08 LAB — HEMOGLOBIN AND HEMATOCRIT, BLOOD
HCT: 28.6 % — ABNORMAL LOW (ref 36.0–46.0)
Hemoglobin: 9 g/dL — ABNORMAL LOW (ref 12.0–15.0)

## 2024-03-08 MED ORDER — SENNOSIDES-DOCUSATE SODIUM 8.6-50 MG PO TABS
2.0000 | ORAL_TABLET | Freq: Every day | ORAL | Status: DC
  Administered 2024-03-08 – 2024-03-09 (×2): 2 via ORAL
  Filled 2024-03-08 (×4): qty 2

## 2024-03-08 MED ORDER — SENNOSIDES-DOCUSATE SODIUM 8.6-50 MG PO TABS: 2.0000 | ORAL_TABLET | Freq: Every day | ORAL | Status: DC

## 2024-03-08 MED ORDER — CHLORHEXIDINE GLUCONATE CLOTH 2 % EX PADS: 6.0000 | MEDICATED_PAD | Freq: Every day | CUTANEOUS | Status: DC

## 2024-03-08 MED ORDER — BENZONATATE 100 MG PO CAPS
200.0000 mg | ORAL_CAPSULE | Freq: Three times a day (TID) | ORAL | Status: DC
  Administered 2024-03-08 – 2024-03-11 (×9): 200 mg via ORAL
  Filled 2024-03-08 (×9): qty 2

## 2024-03-08 NOTE — Progress Notes (Signed)
 [facility] Progress Note   Subjective:    Seen in room. Pt had no complaints with HD yesterday. No dyspnea reported. She does have +1 LE edema. Her daughter was in the room and we were able to discuss her labs. Pt dispo is CIR pending insurance.   Objective Vitals:   03/07/24 2011 03/08/24 0452 03/08/24 0754 03/08/24 0756  BP:  (!) 137/54 110/62 110/62  Pulse:  88 82 88  Resp:  18  18  Temp:  98.4 F (36.9 C) 98.4 F (36.9 C)   TempSrc:  Oral Oral   SpO2: 98% 96% 95%   Weight:      Height:       Physical Exam General: Sitting up in chair, nad  Heart: RRR, Lungs: Clear bilaterally  Abdomen: Soft, non-distended Extremities: no LE edema  Dialysis Access: LUE AVG +bruit   Filed Weights   03/06/24 0603 03/07/24 0406 03/07/24 0850  Weight: 73.8 kg 75.9 kg 78 kg    Intake/Output Summary (Last 24 hours) at 03/08/2024 1439 Last data filed at 03/08/2024 0830 Gross per 24 hour  Intake 600 ml  Output --  Net 600 ml    Additional Objective Labs: Basic Metabolic Panel: Recent Labs  Lab 03/06/24 0426 03/07/24 0503 03/08/24 0502  NA 128* 127* 128*  K 4.0 5.0 4.2  CL 89* 90* 91*  CO2 27 25 26   GLUCOSE 96 148* 161*  BUN 38* 52* 37*  CREATININE 5.07* 6.15* 4.72*  CALCIUM  8.8* 9.0 8.6*  PHOS 4.1 4.3 3.3   Liver Function Tests: Recent Labs  Lab 03/02/24 1132 03/03/24 0500 03/06/24 0426 03/07/24 0503 03/08/24 0502  AST 74*  --   --   --   --   ALT 52*  --   --   --   --   ALKPHOS 57  --   --   --   --   BILITOT 1.1  --   --   --   --   PROT 4.8*  --   --   --   --   ALBUMIN  2.7*   < > 2.4* 2.5* 2.4*   < > = values in this interval not displayed.   Recent Labs  Lab 03/02/24 1132  LIPASE 66*  AMYLASE 29   CBC: Recent Labs  Lab 03/03/24 0500 03/03/24 1606 03/04/24 0757 03/05/24 0500 03/06/24 0426 03/07/24 0503 03/08/24 0502  WBC 8.0 10.4 7.6 7.3 6.2  --   --   HGB 9.1* 9.5* 9.4* 9.4* 8.3* 9.2* 9.0*  HCT 27.1* 28.5* 28.1* 28.7* 25.6*  28.2* 28.6*  MCV 97.8 100.7* 99.3 102.5* 103.2*  --   --   PLT 141* 165 177 162 153  --   --    Blood Culture    Component Value Date/Time   SDES BLOOD RIGHT HAND 03/16/2021 0046   SPECREQUEST  03/16/2021 0046    BOTTLES DRAWN AEROBIC ONLY Blood Culture results may not be optimal due to an inadequate volume of blood received in culture bottles   CULT  03/16/2021 0046    NO GROWTH 5 DAYS Performed at [facility], [address] [place], [place] [address]    REPTSTATUS 03/21/2021 FINAL 03/16/2021 0046    Cardiac Enzymes: No results for input(s): "CKTOTAL", "CKMB", "CKMBINDEX", "TROPONINI" in the last 168 hours. CBG: Recent Labs  Lab 03/07/24 1633 03/07/24 1959 03/07/24 2200 03/08/24 0723 03/08/24 1117  GLUCAP 249* 347* 314* 143* 205*   Iron  Studies:  No results for input(s): "IRON ", "TIBC", "TRANSFERRIN", "FERRITIN" in the last 72 hours. Lab Results  Component Value Date   INR 1.4 (H) 03/02/2024   INR 1.0 03/29/2021     Medications:     acetaminophen   1,000 mg Oral Q8H   atorvastatin   10 mg Oral Daily   benzonatate   200 mg Oral TID   Chlorhexidine  Gluconate Cloth  6 each Topical Q0600   [START ON 03/09/2024] Chlorhexidine  Gluconate Cloth  6 each Topical Q0600   [START ON 03/09/2024] darbepoetin (ARANESP ) injection - DIALYSIS  100 mcg Subcutaneous Q Thu-1800   diclofenac Sodium  4 g Topical QID   feeding supplement  237 mL Oral BID BM   ferric citrate   210 mg Oral TID WC   furosemide   80 mg Oral Daily   insulin  aspart  0-5 Units Subcutaneous QHS   insulin  aspart  0-6 Units Subcutaneous TID WC   insulin  glargine-yfgn  6 Units Subcutaneous Daily   lidocaine   1 patch Transdermal Q24H   Muscle Rub   Topical BID   pantoprazole   40 mg Oral BID   polyethylene glycol  17 g Oral Daily   senna-docusate  2 tablet Oral Daily    Dialysis Orders: [facility] 3.5h   B400    70.6kg    LUA AVG   Heparin  none Last OP HD 5/01, post wt 70.9 Comes off 0-2kg over, bp's  wnl on HD Mircera 200mcg IV q 2 weeks- last dose 02/24/24  Assessment/Plan: 83 year old woman with ESRD on HD who presented with acute blood loss anemia, found to have GI bleed on capsule endoscopy.  Underwent EGD 5/8.  EGD was complicated by bradycardic arrest. She was transferred to the ICU postprocedure.   Symptomatic anemia/GI bleed.  Hb 6.6 on admission. 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 GI recommends against future endoscopy d/t procedural bradycardia. Supportive management going forward. Serial H/H. Transfuse prn Hgb <7.  Cardiac arrest:  2/2 hypotension and bradycardia. S/p brief CPR with rib fractures. Now extubated.  Hyponatremia: chronic mild hypoNa high 120s low 130s. Improved with HD yesterday. Na 128 today.  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  Next HD 5/15 HTN:  BP stable Volume: +1 LE edema today.  Anemia of esrd + ABLA:  s/p 2 units PRBC 5/2. ESA given 02/24/24 --Next due 5/15.  Hb 9s.  Aranesp  ordered for 03/09/24 Secondary hyperparathyroidism: Cor Ca and phos in range, cont binders ac tid.  IDDM: per pmd Generalized weakness. Looks like being evaluated for CIR. Continue protein supplements. Alb 2.4   Hersey Lorenzo, NP [facility] 03/08/2024,2:39 PM

## 2024-03-08 NOTE — Care Management Obs Status (Signed)
 MEDICARE OBSERVATION STATUS NOTIFICATION   Patient Details  Name: YENTY COCKRAN MRN: [id number] Date of Birth: 12-Jul-1941   Medicare Observation Status Notification Given:  Yes    Leon Goodnow 03/08/2024, 10:02 AM

## 2024-03-08 NOTE — NC FL2 (Signed)
 [place]  MEDICAID FL2 LEVEL OF CARE FORM     IDENTIFICATION  Patient Name: Anna Carr Birthdate: 03-20-1941 Sex: female Admission Date (Current Location): 02/25/2024  [facility] and [place] Number:  [employer] and Address:  [facility] [place], [address], [place], [place] [address]      Provider Number: [id number]  Attending Physician Name and Address:  Priscella Brooms, DO  Relative Name and Phone Number:       Current Level of Care: Hospital Recommended Level of Care: Skilled Nursing Facility Prior Approval Number:    Date Approved/Denied:   PASRR Number: [id number]  Discharge Plan: SNF    Current Diagnoses: Patient Active Problem List   Diagnosis Date Noted   Acute pain of left shoulder 03/05/2024   Musculoskeletal chest pain 03/03/2024   AVM (arteriovenous malformation) of duodenum, acquired 03/03/2024   Acute upper GI bleed 03/03/2024   Cardiac arrest ([name]) 03/02/2024   Symptomatic anemia 02/28/2024   Acute on chronic anemia 02/25/2024   Angiodysplasia of small intestine 02/25/2024   Peripheral vascular disease, unspecified ([name]) critical stenosis lower extremity 12/01/2023   Aortic stenosis moderate 2024 06/21/2023   Pulmonary nodule 06/09/2023   AVM (arteriovenous malformation) of small bowel, acquired with hemorrhage    Anemia due to chronic blood loss    GI bleed 12/19/2021   Mitral regurgitation moderate to severe based on echocardiogram from 2022 11/03/2021   Pulmonary hypertension, unspecified ([name]) 11/03/2021   AVM (arteriovenous malformation) of duodenum, acquired with hemorrhage 10/30/2021   Cholelithiasis 03/30/2021   Heme positive stool    Elevated troponin 03/16/2021   Cellulitis 03/16/2021   COVID-19 10/29/2020   Contact with and (suspected) exposure to covid-19 10/28/2020   Cough, unspecified 10/28/2020   PAT (paroxysmal atrial tachycardia) ([name]) 10/04/2020   AV fistula ([name])    COPD mixed type ([name])     Depression    Dyspnea on exertion    Hypertension    Osteoarthritis    Mild protein-calorie malnutrition ([name]) 03/26/2020   Complication of vascular dialysis catheter 03/05/2020   Fever, unspecified 01/23/2020   Diverticulosis of large intestine without perforation or abscess without bleeding 01/08/2020   History of colonic polyps 01/08/2020   Other disorders of phosphorus metabolism 01/08/2020   Unspecified hemorrhoids 01/08/2020   ESRD on hemodialysis ([name]) 01/02/2020   GIB (gastrointestinal bleeding) 01/02/2020   Encounter for removal of sutures 12/28/2019   Coagulation defect, unspecified ([name]) 12/07/2019   Encounter for screening for respiratory tuberculosis 12/06/2019   Iron  deficiency anemia, unspecified 12/06/2019   Pruritus, unspecified 12/06/2019   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/2019   Dyspnea, unspecified 11/02/2019   ESRD on dialysis ([name]) 09/18/2019   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   Hyponatremia 03/11/2019   Hypoxia 03/11/2019   Hypokalemia 03/11/2019   Chronic laryngitis 02/17/2017   CHF with left ventricular diastolic dysfunction, NYHA class 1 ([name]) 02/02/2017   Anemia in chronic kidney disease 01/23/2017   Type 2 diabetes mellitus with chronic kidney disease on chronic dialysis, with long-term current use of insulin  ([name]) 12/23/2015   Chronic constipation 12/22/2015   Colon polyp 12/22/2015   High risk medication use 12/22/2015   HLD (hyperlipidemia) 12/22/2015   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   Osteopenia 12/22/2015   Insulin  long-term use ([name]) 12/22/2015   Insomnia 12/22/2015   Major depressive disorder, recurrent ([name]) 12/22/2015    Orientation RESPIRATION BLADDER Height & Weight     Self, Time, Situation,  Place  Normal Continent Weight: 171 lb 15.3 oz (78 kg) Height:  5\' 1"  (154.9 cm)  BEHAVIORAL SYMPTOMS/MOOD NEUROLOGICAL BOWEL NUTRITION STATUS      Continent Diet (see dc summary)   AMBULATORY STATUS COMMUNICATION OF NEEDS Skin   Limited Assist Verbally Skin abrasions (knee abrasion)                       Personal Care Assistance Level of Assistance  Bathing, Feeding, Dressing Bathing Assistance: Limited assistance Feeding assistance: Independent Dressing Assistance: Limited assistance     Functional Limitations Info  Hearing   Hearing Info: Impaired      SPECIAL CARE FACTORS FREQUENCY  PT (By licensed PT), OT (By licensed OT)     PT Frequency: 5x/week OT Frequency: 5x/week            Contractures Contractures Info: Not present    Additional Factors Info  Code Status, Allergies Code Status Info: Full Allergies Info: NKA           Current Medications (03/08/2024):  This is the current hospital active medication list Current Facility-Administered Medications  Medication Dose Route Frequency Provider Last Rate Last Admin   acetaminophen  (TYLENOL ) tablet 1,000 mg  1,000 mg Oral Q8H Zheng, Michael, DO   1,000 mg at 03/08/24 1610   albuterol  (PROVENTIL ) (2.5 MG/3ML) 0.083% nebulizer solution 2.5 mg  2.5 mg Nebulization Q6H PRN Chucky Craver, MD       atorvastatin  (LIPITOR) tablet 10 mg  10 mg Oral Daily Zheng, Michael, DO   10 mg at 03/06/24 9604   benzonatate  (TESSALON ) capsule 200 mg  200 mg Oral TID PRN Aurora Lees, DO   200 mg at 03/07/24 0956   Chlorhexidine  Gluconate Cloth 2 % PADS 6 each  6 each Topical Q0600 Rubbie Cornwall, NP       [START ON 03/09/2024] Darbepoetin Alfa  (ARANESP ) injection 100 mcg  100 mcg Subcutaneous Q Thu-1800 Dickinson, Brooksie Cap, NP       diclofenac Sodium (VOLTAREN) 1 % topical gel 4 g  4 g Topical QID Zheng, Michael, DO   4 g at 03/08/24 0850   feeding supplement (ENSURE ENLIVE / ENSURE PLUS) liquid 237 mL  237 mL Oral BID BM Carleen Chary, DO   237 mL at 03/08/24 0850   ferric citrate  (AURYXIA ) tablet 210 mg  210 mg Oral TID WC Cirigliano, Vito V, DO   210 mg at 03/08/24 0848   furosemide  (LASIX )  tablet 80 mg  80 mg Oral Daily Cirigliano, Vito V, DO   80 mg at 03/08/24 0849   insulin  aspart (novoLOG ) injection 0-5 Units  0-5 Units Subcutaneous QHS Cirigliano, Vito V, DO   4 Units at 03/07/24 2207   insulin  aspart (novoLOG ) injection 0-6 Units  0-6 Units Subcutaneous TID WC Cirigliano, Vito V, DO   2 Units at 03/07/24 1710   insulin  glargine-yfgn (SEMGLEE ) injection 6 Units  6 Units Subcutaneous Daily Juberg, Christopher, DO   6 Units at 03/07/24 1404   lidocaine  (LIDODERM ) 5 % 1 patch  1 patch Transdermal Q24H Payne, John D, PA-C   1 patch at 03/08/24 0849   melatonin tablet 3 mg  3 mg Oral QHS PRN Aurora Lees, DO   3 mg at 03/05/24 2134   Muscle Rub CREA   Topical BID Rawland Caddy, MD   Given at 03/07/24 2154   oxyCODONE  (Oxy IR/ROXICODONE ) immediate release tablet 5 mg  5 mg Oral Q4H PRN Fulton Job,  Roselyn Connor, DO   5 mg at 03/05/24 2140   pantoprazole  (PROTONIX ) EC tablet 40 mg  40 mg Oral BID Zheng, Michael, DO   40 mg at 03/08/24 0849   polyethylene glycol (MIRALAX  / GLYCOLAX ) packet 17 g  17 g Oral Daily Young Hensen, RPH   17 g at 03/07/24 1348   senna-docusate (Senokot-S) tablet 1 tablet  1 tablet Oral QHS Juberg, Christopher, DO   1 tablet at 03/07/24 2154     Discharge Medications: Please see discharge summary for a list of discharge medications.  Relevant Imaging Results:  Relevant Lab Results:   Additional Information SSN: [id number].  Requires transport to out-pt HD at [facility] on TTS 10:45 am chair time  Jannice Mends, LCSW

## 2024-03-08 NOTE — TOC Progression Note (Addendum)
 Transition of Care [facility]) - Progression Note    Patient Details  Name: Anna Carr MRN: [id number] Date of Birth: Jun 30, 1941  Transition of Care [facility]) CM/SW Contact  Simaya Lumadue A [place], LCSW Phone Number: 03/08/2024, 12:19 PM  Clinical Narrative:     CSW spoke with pt's daughter Ammon Bales at bedside with pt. Discussed options for SNF in event CIR is not a possibility. Pt stated that she may just want to return home as well, with Home Health, and says son is at home too and can assist pt with ADL's. RNCM notified regarding possible home dispo.   Preference for facility in [place], [place] due to outpatient HD transportation.   Auth still pending for appeal with CIR.    TOC will continue to follow.   Expected Discharge Plan: IP Rehab Facility Barriers to Discharge: Continued Medical Work up, English as a second language teacher  Expected Discharge Plan and Services       Living arrangements for the past 2 months: Single Family Home                                       Social Determinants of Health (SDOH) Interventions SDOH Screenings   Food Insecurity: No Food Insecurity (02/26/2024)  Housing: Low Risk  (02/26/2024)  Transportation Needs: No Transportation Needs (02/26/2024)  Utilities: Not At Risk (02/26/2024)  Financial Resource Strain: Low Risk  (10/27/2021)   Received from [facility]  Social Connections: Socially Isolated (03/04/2024)  Stress: No Stress Concern Present (10/27/2021)   Received from [facility]  Tobacco Use: Medium Risk (03/02/2024)    Readmission Risk Interventions     No data to display

## 2024-03-08 NOTE — Progress Notes (Signed)
 Physical Therapy Treatment Patient Details Name: Anna Carr MRN: [id number] DOB: Feb 11, 1941 Today's Date: 03/08/2024   History of Present Illness 83 yo female who presents 5/2 with a few days of worsening weakness and admitted for concern for UGI bleed. 5/5 small bowel endoscopy. 5/8 endoscopy where pt had cardiac arrest requiring brief CPR with broken ribs. 5/8 admit to ICU. Multiple prior falls. PMH: ESRD on HD TThS, T2DM, nonrheumatic mitral valve regurgitation, HFpEF, advanced PVD, anemia of chronic disease, and hx of duodenal/jejunal angioectasias in 2022.    PT Comments  Patient progressing with mobility increasing ambulation distance and with less assistance.  Son in the room and supportive though pt remains limited with activity tolerance due to dyspnea, pain and quickly fatigues.  She has potential to achieve independence with mobility and ADL's and for reduced fall risk with intensive inpatient rehab prior to d/c home.  PT will continue to follow.    If plan is discharge home, recommend the following: A little help with walking and/or transfers;A little help with bathing/dressing/bathroom;Assist for transportation;Help with stairs or ramp for entrance   Can travel by private vehicle        Equipment Recommendations  None recommended by PT    Recommendations for Other Services       Precautions / Restrictions Precautions Precautions: Fall Recall of Precautions/Restrictions: Intact Precaution/Restrictions Comments: rib fxs, L shoulder tender/sore     Mobility  Bed Mobility Overal bed mobility: Needs Assistance Bed Mobility: Rolling, Sidelying to Sit Rolling: Supervision Sidelying to sit: Supervision, Used rails     Sit to sidelying: Min assist General bed mobility comments: light assist for brining legs up (per pt request)    Transfers Overall transfer level: Needs assistance Equipment used: Rolling walker (2 wheels) Transfers: Sit to/from Stand Sit to  Stand: Min assist           General transfer comment: some assist for balance with anterior weight shift    Ambulation/Gait Ambulation/Gait assistance: Contact guard assist Gait Distance (Feet): 70 Feet Assistive device: Rolling walker (2 wheels) Gait Pattern/deviations: Step-through pattern, Decreased stride length       General Gait Details: mild dyspnea, though SpO2 not reading on finger (cool to touch) self limited distance in hallway   Stairs             Wheelchair Mobility     Tilt Bed    Modified Rankin (Stroke Patients Only)       Balance Overall balance assessment: Needs assistance   Sitting balance-Leahy Scale: Good Sitting balance - Comments: leaning to complete toilet hygiene in bathroom   Standing balance support: No upper extremity supported, During functional activity Standing balance-Leahy Scale: Poor Standing balance comment: washing hands at sink leaning on sink for support                            Communication Communication Communication: Impaired Factors Affecting Communication: Hearing impaired  Cognition Arousal: Alert Behavior During Therapy: WFL for tasks assessed/performed, Flat affect   PT - Cognitive impairments: No apparent impairments                         Following commands: Intact      Cueing Cueing Techniques: Verbal cues  Exercises      General Comments General comments (skin integrity, edema, etc.): son in the room and supportive, rubbed cream on her shoulder for pain relief; noted pedal  edema socks removed to rest feet with indentation from elastic      Pertinent Vitals/Pain Pain Assessment Pain Assessment: Faces Faces Pain Scale: Hurts little more Pain Location: L shoulder, chest Pain Descriptors / Indicators: Guarding, Sharp, Discomfort Pain Intervention(s): Monitored during session, Repositioned    Home Living                          Prior Function             PT Goals (current goals can now be found in the care plan section) Progress towards PT goals: Progressing toward goals    Frequency    Min 3X/week      PT Plan      Co-evaluation              AM-PAC PT "6 Clicks" Mobility   Outcome Measure  Help needed turning from your back to your side while in a flat bed without using bedrails?: A Little Help needed moving from lying on your back to sitting on the side of a flat bed without using bedrails?: A Little Help needed moving to and from a bed to a chair (including a wheelchair)?: A Little Help needed standing up from a chair using your arms (e.g., wheelchair or bedside chair)?: A Little Help needed to walk in hospital room?: A Little Help needed climbing 3-5 steps with a railing? : Total 6 Click Score: 16    End of Session Equipment Utilized During Treatment: Gait belt Activity Tolerance: Patient tolerated treatment well Patient left: in bed;with call bell/phone within reach;with family/visitor present   PT Visit Diagnosis: Other abnormalities of gait and mobility (R26.89);Muscle weakness (generalized) (M62.81);History of falling (Z91.81)     Time: 1610-9604 PT Time Calculation (min) (ACUTE ONLY): 21 min  Charges:    $Gait Training: 8-22 mins PT General Charges $$ ACUTE PT VISIT: 1 Visit                     Abigail Hoff, PT Acute Rehabilitation Services Office:[phone] 03/08/2024    Anna Carr 03/08/2024, 5:46 PM

## 2024-03-08 NOTE — Progress Notes (Signed)
 Mobility Specialist Progress Note:   03/08/24 0800  Mobility  Activity Ambulated with assistance in hallway  Level of Assistance Contact guard assist, steadying assist  Assistive Device Front wheel walker  Distance Ambulated (ft) 60 ft  Activity Response Tolerated well  Mobility Referral Yes  Mobility visit 1 Mobility  Mobility Specialist Start Time (ACUTE ONLY) [id number]  Mobility Specialist Stop Time (ACUTE ONLY) [id number]  Mobility Specialist Time Calculation (min) (ACUTE ONLY) 8 min   Pt received in BR and agreeable. C/o L shoulder pain and BLE weakness. Able to ambulate in hallway. Returned to room and and able to stand at sink to brush teeth. Pt left in chair w/ NT present.  D'Vante Nolon Baxter Mobility Specialist Please contact via Special educational needs teacher or Rehab office at [phone]

## 2024-03-08 NOTE — Inpatient Diabetes Management (Signed)
 Inpatient Diabetes Program Recommendations  AACE/ADA: New Consensus Statement on Inpatient Glycemic Control (2015)  Target Ranges:  Prepandial:   less than 140 mg/dL      Peak postprandial:   less than 180 mg/dL (1-2 hours)      Critically ill patients:  140 - 180 mg/dL   Lab Results  Component Value Date   GLUCAP 205 (H) 03/08/2024   HGBA1C 5.6 02/25/2024    Review of Glycemic Control  Latest Reference Range & Units 03/07/24 13:42 03/07/24 16:33 03/07/24 19:59 03/07/24 22:00 03/08/24 07:23 03/08/24 11:17  Glucose-Capillary 70 - 99 mg/dL 540 (H) 981 (H) 191 (H) 314 (H) 143 (H) 205 (H)   Diabetes history: DM 2 Outpatient Diabetes medications:  Soliqua  12 units daily Current orders for Inpatient glycemic control:  Novolog  0-6 units tid with meals and HS Semglee  6 units daily  Inpatient Diabetes Program Recommendations:    May consider adding Novolog  2 units tid with meals (hold if patient eats less than 50% or NPO).  Thanks  Josefa Ni, RN, BC-ADM Inpatient Diabetes Coordinator Pager [phone]  (8a-5p)

## 2024-03-08 NOTE — Progress Notes (Addendum)
 HD#7 SUBJECTIVE:  Patient Summary: Anna Carr is a 83 y.o. with a pertinent PMH of ESRD on HD TThS, T2DM, nonrheumatic mitral valve regurgitation, HFpEF, advanced PVD, anemia of chronic disease, and hx of duodenal/jejunal angioectasias in 2022 who presented with weakness and melenic stool and admitted for concern for UGI bleed.    She experienced brief cardiac arrest during EGD on 03/02/24 and was subsequently intubated and transferred to ICU. Extubated later on 5/8 and IMTS assumed care on medical floor on 5/10.  Overnight Events and Interim History: NEO. She is out of bed, sitting up in chair. Complains of her shoulder pain but otherwise without concern. A recent bowel movement was small and without blood. She has a lingering cough but no dyspnea, respiratory distress.  Significant Hospital Events:  5/8 Cardiac arrest during EGD on 5/8, intubated in ICU 5/8 Extubated 5/10 Returned to floor care  OBJECTIVE:  Vital Signs: Vitals:   03/07/24 2011 03/08/24 0452 03/08/24 0754 03/08/24 0756  BP:  (!) 137/54 110/62 110/62  Pulse:  88 82 88  Resp:  18  18  Temp:  98.4 F (36.9 C) 98.4 F (36.9 C)   TempSrc:  Oral Oral   SpO2: 98% 96% 95%   Weight:      Height:       Supplemental O2: Room Air SpO2: 95 % O2 Flow Rate (L/min): 2 L/min FiO2 (%): 50 %  Filed Weights   03/06/24 0603 03/07/24 0406 03/07/24 0850  Weight: 73.8 kg 75.9 kg 78 kg     Intake/Output Summary (Last 24 hours) at 03/08/2024 1011 Last data filed at 03/08/2024 0830 Gross per 24 hour  Intake 840 ml  Output 3000 ml  Net -2160 ml   Net IO Since Admission: -2,693.08 mL [03/08/24 1011]  Physical Exam: Physical Exam Constitutional:      General: She is not in acute distress.    Appearance: She is not ill-appearing.  Cardiovascular:     Rate and Rhythm: Normal rate and regular rhythm.     Pulses: Normal pulses.  Abdominal:     General: Abdomen is flat. Bowel sounds are normal.     Tenderness: There is  no abdominal tenderness. There is no guarding.  Musculoskeletal:        General: Tenderness present. No swelling or deformity.     Comments: L shoulder pain localized to L medial scapular area with mild TTP, no swelling or deformity. Skin:    General: Skin is warm and dry.  Neurological:     General: No focal deficit present.     Mental Status: She is alert.   Patient Lines/Drains/Airways Status     Active Line/Drains/Airways     Name Placement date Placement time Site Days   Peripheral IV 03/05/24 20 G 1.88" Right;Anterior Forearm 03/05/24  1106  Forearm  3   Fistula / Graft Left Upper arm Arteriovenous vein graft 03/12/21  1254  Upper arm  1092   Wound / Incision (Open or Dehisced) 02/26/24 Other (Comment) Knee Right;Left;Anterior bilateral knee abrasion 02/26/24  1900  Knee  11             ASSESSMENT/PLAN:  Assessment: Principal Problem:   Acute on chronic anemia Active Problems:   Type 2 diabetes mellitus with chronic kidney disease on chronic dialysis, with long-term current use of insulin  ([name])   ESRD on dialysis ([name])   Angiodysplasia of small intestine   Symptomatic anemia   Cardiac arrest [facility])   Musculoskeletal  chest pain   AVM (arteriovenous malformation) of duodenum, acquired   Acute upper GI bleed   Acute pain of left shoulder  Anna Carr is a 83 y.o. with a pertinent PMH of ESRD on HD TThS, T2DM, nonrheumatic mitral valve regurgitation, HFpEF, advanced PVD, anemia of chronic disease, and hx of duodenal/jejunal angioectasias in 2022 who presented with weakness and melenic stool and admitted for concern for UGI bleed.   She experienced brief cardiac arrest during EGD on 03/02/24 and was subsequently intubated and transferred to ICU. Extubated later on 5/8 and IMTS assumed care on medical floor on 5/10.   Discharge planning underway. Remains medically well with no further active treatment.  Plan: Acute on Chronic Anemia from Acute Upper GI bleed  (stable) Hx of duodenal/jejunal angioectasias Hx Diverticulosis (2022)  This was her presenting issue. Hg remains 9 again and no evidence of bleeding. EGD on 5/8 with argon plasma coagulation of non bleeding AVMs in the duodenum. Pt chronically has black stools. - transfusion goal > 7 - Will likely resume her aspirin  for PAD but would stop for good if she bleeds again in future - High dose PPI BID - GI following peripherally, thank you - continue to work with PT/OT for strength and endurance - Iron  infusions - Medically stable, pending CIR appeal vs SNF   L shoulder pain, musculoskeletal Improving. Etiology from moving herself in bed, possible influence from her recent CPR. XR unrevealing.  - Voltaren Gel & tylenol  & robaxin for breakthrough, aquathermia, encouraged gentle exercise - PT/OT   Recent Cardiac Arrest during EGD with CPR Chest pain MSK from CPR Likely vagal with intra-procedural bradycardia and arrest. Short duration of CPR until ROSC. Spent two nights in ICU, was extubated 5/8 and transferred 5/10. Pt no longer candidate for endoscopy. - lidocaine  patch, tylenol , oxycodone  prn for chest wall pain - Dys diet diet per speech - At risk for aspiration, will monitor respiratory status, which continues to be stable    Stable Issues   Chronic Hyponatremia As low as 120 early in course but improved with return to diet, potentially due to recently being NPO and ESRD. Baseline 127-130 range. No symptoms. - Ensure feeding supplement   ESRD on HD TThS Resume dialysis schedule. Monitor fluid balance and electrolytes. - No piccs   T2DM Patient was hypoglycemic earlier in hospitalization and so maintenance insulin  stopped. Resumed at lower dose when am sugars trended above 200. -continue to monitor glucose, continue SSI -A1c below 6, on 12 semglee  PTH, restarted at 6 units daily   HFpEF Stable, not in exacerbation. Last echo 12/2023 with 60-65 EF and normal function of the LV.  Has moderate to severe MR. No aortic stenosis.  - Continue lasix  and resume coreg  near discharge - Daily weights, I&Os. Net -1.1L since admit due to HD    Best Practice: Diet: Dys 2  IVF: n/a VTE: SCDs Start: 03/02/24 1129 SCDs Start: 02/25/24 1331 Code: Full AB: n/a Pain Medicine: Tylenol , lidocaine  patch, aquathermia Bowel Regimen: Miralax  daily, senokot  Therapy Recs: CIR vs SNF DISPO: Anticipated discharge in 1-3 days to CIR vs SNF pending insurance and bed availability.   Signature: Carleen Chary, D.O.  Internal Medicine Resident, PGY-1 [facility]  Pager: # [phone]. 10:11 AM, 03/08/2024

## 2024-03-08 NOTE — Progress Notes (Signed)
 Inpatient Rehab Admissions Coordinator:    Insurance denied case for CIR. Pt. Wishes to appeal and expedited appeal was sent yesterday PM.  Wandalee Gust, MS, CCC-SLP Rehab Admissions Coordinator  [phone] (celll) [phone] (office)

## 2024-03-08 NOTE — Care Management Important Message (Signed)
 Important Message  Patient Details  Name: Anna Carr MRN: [id number] Date of Birth: 03-Feb-1941   Important Message Given:  Yes - Medicare IM     [name] 03/08/2024, 3:12 PM

## 2024-03-08 NOTE — Plan of Care (Signed)

## 2024-03-09 ENCOUNTER — Inpatient Hospital Stay (HOSPITAL_COMMUNITY)

## 2024-03-09 DIAGNOSIS — N186 End stage renal disease: Secondary | ICD-10-CM | POA: Diagnosis not present

## 2024-03-09 DIAGNOSIS — Z66 Do not resuscitate: Secondary | ICD-10-CM | POA: Diagnosis not present

## 2024-03-09 DIAGNOSIS — K31811 Angiodysplasia of stomach and duodenum with bleeding: Secondary | ICD-10-CM | POA: Diagnosis not present

## 2024-03-09 DIAGNOSIS — I132 Hypertensive heart and chronic kidney disease with heart failure and with stage 5 chronic kidney disease, or end stage renal disease: Secondary | ICD-10-CM | POA: Diagnosis not present

## 2024-03-09 LAB — CBC
HCT: 28.1 % — ABNORMAL LOW (ref 36.0–46.0)
Hemoglobin: 9.1 g/dL — ABNORMAL LOW (ref 12.0–15.0)
MCH: 32.4 pg (ref 26.0–34.0)
MCHC: 32.4 g/dL (ref 30.0–36.0)
MCV: 100 fL (ref 80.0–100.0)
Platelets: 183 10*3/uL (ref 150–400)
RBC: 2.81 MIL/uL — ABNORMAL LOW (ref 3.87–5.11)
RDW: 18.7 % — ABNORMAL HIGH (ref 11.5–15.5)
WBC: 7 10*3/uL (ref 4.0–10.5)
nRBC: 0 % (ref 0.0–0.2)

## 2024-03-09 LAB — RENAL FUNCTION PANEL
Albumin: 2.4 g/dL — ABNORMAL LOW (ref 3.5–5.0)
Anion gap: 10 (ref 5–15)
BUN: 52 mg/dL — ABNORMAL HIGH (ref 8–23)
CO2: 26 mmol/L (ref 22–32)
Calcium: 8.6 mg/dL — ABNORMAL LOW (ref 8.9–10.3)
Chloride: 88 mmol/L — ABNORMAL LOW (ref 98–111)
Creatinine, Ser: 6.03 mg/dL — ABNORMAL HIGH (ref 0.44–1.00)
GFR, Estimated: 7 mL/min — ABNORMAL LOW (ref >60)
Glucose, Bld: 144 mg/dL — ABNORMAL HIGH (ref 70–99)
Phosphorus: 3.8 mg/dL (ref 2.5–4.6)
Potassium: 4.5 mmol/L (ref 3.5–5.1)
Sodium: 124 mmol/L — ABNORMAL LOW (ref 135–145)

## 2024-03-09 LAB — GLUCOSE, CAPILLARY
Glucose-Capillary: 102 mg/dL — ABNORMAL HIGH (ref 70–99)
Glucose-Capillary: 373 mg/dL — ABNORMAL HIGH (ref 70–99)
Glucose-Capillary: 203 mg/dL — ABNORMAL HIGH (ref 70–99)

## 2024-03-09 MED ORDER — LIDOCAINE-PRILOCAINE 2.5-2.5 % EX CREA: 1.0000 | TOPICAL_CREAM | CUTANEOUS | Status: DC | PRN

## 2024-03-09 MED ORDER — ONDANSETRON HCL 4 MG/2ML IJ SOLN
4.0000 mg | Freq: Once | INTRAMUSCULAR | Status: AC
  Administered 2024-03-09: 4 mg via INTRAVENOUS
  Filled 2024-03-09: qty 2

## 2024-03-09 MED ORDER — HEPARIN SODIUM (PORCINE) 1000 UNIT/ML DIALYSIS: 1000.0000 [IU] | INTRAMUSCULAR | Status: DC | PRN

## 2024-03-09 MED ORDER — ANTICOAGULANT SODIUM CITRATE 4% (200MG/5ML) IV SOLN: 5.0000 mL | Status: DC | PRN

## 2024-03-09 MED ORDER — PENTAFLUOROPROP-TETRAFLUOROETH EX AERO: 1.0000 | INHALATION_SPRAY | CUTANEOUS | Status: DC | PRN

## 2024-03-09 MED ORDER — METHOCARBAMOL 500 MG PO TABS: 500.0000 mg | ORAL_TABLET | Freq: Once | ORAL | Status: DC

## 2024-03-09 MED ORDER — ALTEPLASE 2 MG IJ SOLR: 2.0000 mg | Freq: Once | INTRAMUSCULAR | Status: DC | PRN

## 2024-03-09 MED ORDER — LIDOCAINE HCL (PF) 1 % IJ SOLN: 5.0000 mL | INTRAMUSCULAR | Status: DC | PRN

## 2024-03-09 MED ORDER — OXYCODONE HCL 5 MG PO TABS
5.0000 mg | ORAL_TABLET | ORAL | Status: DC | PRN
  Administered 2024-03-10: 5 mg via ORAL
  Filled 2024-03-09: qty 1

## 2024-03-09 MED ORDER — ORAL CARE MOUTH RINSE: 15.0000 mL | OROMUCOSAL | Status: DC | PRN

## 2024-03-09 NOTE — Progress Notes (Signed)
 [name] Progress Note   Subjective:   Patient seen on HD this morning. She had no complaints. She was able to complete her full treatment today, and had 3L UF. BP stable. No reported SOB or CP today.    Objective Vitals:   03/09/24 1000 03/09/24 1030 03/09/24 1104 03/09/24 1118  BP: (!) 122/58 (!) 96/44 (!) 121/51 136/71  Pulse: 79 74 86 82  Resp: 19 15 14 16   Temp:    97.9 F (36.6 C)  TempSrc:      SpO2: 96% 94% 97% 99%  Weight:    73.1 kg  Height:       Physical Exam General: Laying in bed, nad  Heart: RRR, Lungs: Clear bilaterally  Abdomen: Soft, non-distended Extremities: no LE edema  Dialysis Access: LUE AVG +bruit   Filed Weights   03/09/24 0500 03/09/24 0727 03/09/24 1118  Weight: 79.4 kg 76.1 kg 73.1 kg    Intake/Output Summary (Last 24 hours) at 03/09/2024 1228 Last data filed at 03/09/2024 1118 Gross per 24 hour  Intake 580 ml  Output 3000 ml  Net -2420 ml    Additional Objective Labs: Basic Metabolic Panel: Recent Labs  Lab 03/07/24 0503 03/08/24 0502 03/09/24 0538  NA 127* 128* 124*  K 5.0 4.2 4.5  CL 90* 91* 88*  CO2 25 26 26   GLUCOSE 148* 161* 144*  BUN 52* 37* 52*  CREATININE 6.15* 4.72* 6.03*  CALCIUM  9.0 8.6* 8.6*  PHOS 4.3 3.3 3.8   Liver Function Tests: Recent Labs  Lab 03/07/24 0503 03/08/24 0502 03/09/24 0538  ALBUMIN  2.5* 2.4* 2.4*   No results for input(s): "LIPASE", "AMYLASE" in the last 168 hours.  CBC: Recent Labs  Lab 03/03/24 1606 03/04/24 0757 03/05/24 0500 03/06/24 0426 03/07/24 0503 03/08/24 0502 03/09/24 0744  WBC 10.4 7.6 7.3 6.2  --   --  7.0  HGB 9.5* 9.4* 9.4* 8.3* 9.2* 9.0* 9.1*  HCT 28.5* 28.1* 28.7* 25.6* 28.2* 28.6* 28.1*  MCV 100.7* 99.3 102.5* 103.2*  --   --  100.0  PLT 165 177 162 153  --   --  183   Blood Culture    Component Value Date/Time   SDES BLOOD RIGHT HAND 03/16/2021 0046   SPECREQUEST  03/16/2021 0046    BOTTLES DRAWN AEROBIC ONLY Blood Culture results may  not be optimal due to an inadequate volume of blood received in culture bottles   CULT  03/16/2021 0046    NO GROWTH 5 DAYS Performed at [facility], [address], [place], [place] [address]    REPTSTATUS 03/21/2021 FINAL 03/16/2021 0046    Cardiac Enzymes: No results for input(s): "CKTOTAL", "CKMB", "CKMBINDEX", "TROPONINI" in the last 168 hours. CBG: Recent Labs  Lab 03/07/24 2200 03/08/24 0723 03/08/24 1117 03/08/24 1631 03/08/24 2109  GLUCAP 314* 143* 205* 258* 258*   Iron  Studies: No results for input(s): "IRON ", "TIBC", "TRANSFERRIN", "FERRITIN" in the last 72 hours. Lab Results  Component Value Date   INR 1.4 (H) 03/02/2024   INR 1.0 03/29/2021     Medications:  anticoagulant sodium citrate         acetaminophen   1,000 mg Oral Q8H   atorvastatin   10 mg Oral Daily   benzonatate   200 mg Oral TID   Chlorhexidine  Gluconate Cloth  6 each Topical Q0600   Chlorhexidine  Gluconate Cloth  6 each Topical Q0600   darbepoetin (ARANESP ) injection - DIALYSIS  100 mcg Subcutaneous Q Thu-1800   diclofenac Sodium  4 g Topical QID   feeding supplement  237 mL Oral BID BM   ferric citrate   210 mg Oral TID WC   furosemide   80 mg Oral Daily   insulin  aspart  0-5 Units Subcutaneous QHS   insulin  aspart  0-6 Units Subcutaneous TID WC   insulin  glargine-yfgn  6 Units Subcutaneous Daily   lidocaine   1 patch Transdermal Q24H   Muscle Rub   Topical BID   pantoprazole   40 mg Oral BID   polyethylene glycol  17 g Oral Daily   senna-docusate  2 tablet Oral Daily    Dialysis Orders: [facility] 3.5h   B400    70.6kg    LUA AVG   Heparin  none Last OP HD 5/01, post wt 70.9 Comes off 0-2kg over, bp's wnl on HD Mircera 200mcg IV q 2 weeks- last dose 02/24/24  Assessment/Plan: 83 year old woman with ESRD on HD who presented with acute blood loss anemia, found to have GI bleed on capsule endoscopy.  Underwent EGD 5/8.  EGD was complicated by bradycardic arrest. She was transferred  to the ICU postprocedure.   Symptomatic anemia/GI bleed.  Hb 6.6 on admission. 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 GI recommends against future endoscopy d/t procedural bradycardia. Supportive management going forward. Serial H/H. Transfuse prn Hgb <7.  Cardiac arrest:  2/2 hypotension and bradycardia. S/p brief CPR with rib fractures. Now extubated.  Hyponatremia: chronic mild hypoNa high 120s low 130s. Improved with HD yesterday. Na 128 today.  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  Next HD 5/17 HTN:  BP stable today Volume: trace edema today.  Anemia of esrd + ABLA:  s/p 2 units PRBC 5/2. ESA given 02/24/24 --Next due 5/15.  Hb 9s.  Aranesp  ordered for 03/09/24 Secondary hyperparathyroidism: Cor Ca and phos in range, cont home binders ac tid.  IDDM: per pmd Generalized weakness. Looks like being evaluated for CIR. Continue protein supplements. Alb 2.4   Anna Lorenzo, NP [facility] 03/09/2024,12:28 PM

## 2024-03-09 NOTE — Procedures (Signed)
 Received patient in bed to unit.  Alert and oriented.  Informed consent signed and in chart.   TX duration: 3.25 hours  Patient tolerated well.  Transported back to the room  Alert, without acute distress.  Hand-off given to patient's nurse.   Access used: left fistula Access issues: none  Total UF removed: 3 liters   Clover Dao, RN [facility]

## 2024-03-09 NOTE — Progress Notes (Signed)
 IP rehab admissions - We have received a denial after appeal from insurance carrier for acute inpatient rehab admission.  I will make the patient/family aware of denial.  Options now will be HH versus SNF placement with insurance approval if patient is agreeable.  [phone]

## 2024-03-09 NOTE — Plan of Care (Signed)
  Problem: Health Behavior/Discharge Planning: Goal: Ability to manage health-related needs will improve Outcome: Progressing   Problem: Clinical Measurements: Goal: Ability to maintain clinical measurements within normal limits will improve Outcome: Progressing Goal: Will remain free from infection Outcome: Progressing   Problem: Activity: Goal: Risk for activity intolerance will decrease Outcome: Progressing   Problem: Nutrition: Goal: Adequate nutrition will be maintained Outcome: Progressing   Problem: Coping: Goal: Level of anxiety will decrease Outcome: Progressing   Problem: Safety: Goal: Ability to remain free from injury will improve Outcome: Progressing   

## 2024-03-09 NOTE — Progress Notes (Signed)
 Evaluated patient at bedside after RN paged with concerns of chest pain and a cough. At bedside, patient is not in acute distress. She is sitting at the edge of the bed. She reports that the chest pain is mid-chest and is not new or changed from her prior chest pain due to the CPR compressions. She denies constant chest pain at rest and stated that the chest pain is only present with the cough. She also has a cough that is not new. The cough is not productive nor is she short of breath. The cough is bothersome because it causes chest pain every time she coughs.   Regular rate and rhythm auscultated, bilateral coarse inspiratory breath sounds bilaterally, chest wall tenderness present along the inferior sternum, chest pain is replicated with the palpitation.   Vitals:   03/08/24 0756 03/08/24 1602 03/08/24 1941 03/09/24 0421  BP: 110/62 (!) 146/64 120/67 139/62  Pulse: 88 95 94 95  Temp:  98.2 F (36.8 C) 98.2 F (36.8 C) 98.5 F (36.9 C)  Resp: 18 18 18  (!) 22  Height:      Weight:      SpO2:  96% 96% 97%  TempSrc:   Oral Oral  BMI (Calculated):         Suspect the cough is due to atelectasis from limited respiratory effort due to chest wall pain vs aspiration.  Suspect the chest pain is due to the CPR compressions.  Plan: -CXR -EKG -Lidocaine  patch, please apply to the chest, Voltaren gel -Oxycodone  5mg  q4prn ordered

## 2024-03-09 NOTE — Progress Notes (Signed)
 Occupational Therapy Treatment Patient Details Name: Anna Carr MRN: [id number] DOB: 06-Aug-1941 Today's Date: 03/09/2024   History of present illness 83 yo female who presents 5/2 with a few days of worsening weakness and admitted for concern for UGI bleed. 5/5 small bowel endoscopy. 5/8 endoscopy where pt had cardiac arrest requiring brief CPR with broken ribs. 5/8 admit to ICU. Multiple prior falls. PMH: ESRD on HD TThS, T2DM, nonrheumatic mitral valve regurgitation, HFpEF, advanced PVD, anemia of chronic disease, and hx of duodenal/jejunal angioectasias in 2022.   OT comments  Pt doing well, c/o no pain, but states when pain meds wear off still has pain in chest and L shoudler. Pt ambulating to toilet independently without RW upon entry, no LOB, able to complete without physical assist, supervision for safety. Pt able to complete UB/LB dressing at supervision level, encouraged use of RW for safety. Pt ambulated 80 feet down hall and back using RW, no LOB, did state she was starting to become fatigued, but completed without rest break, c/o no pain. Pt has full ROM to BUEs, did not c/o pain to L shoulder. At this time Pt is doing well, limited by mild decreased activity tolerance, and pain in between taking pain meds. Family is worried about going home just yet, would benefit from postacute rehab >3hrs/day to return to PLOF at mod I level to maximize safety in home setting. Will continue to follow acutely to return to PLOF.       If plan is discharge home, recommend the following:  Two people to help with walking and/or transfers;Two people to help with bathing/dressing/bathroom;Assistance with cooking/housework;Assist for transportation;Help with stairs or ramp for entrance   Equipment Recommendations  BSC/3in1;Tub/shower seat    Recommendations for Other Services      Precautions / Restrictions Precautions Precautions: Fall Recall of Precautions/Restrictions:  Intact Precaution/Restrictions Comments: rib fxs, L shoulder tender/sore Restrictions Weight Bearing Restrictions Per Provider Order: No       Mobility Bed Mobility Overal bed mobility: Needs Assistance             General bed mobility comments: in recliner    Transfers Overall transfer level: Modified independent                 General transfer comment: has been independently ambulating around room to toilet and back multiple times daily, with/without RW for support, no LOB.     Balance Overall balance assessment: Mild deficits observed, not formally tested                                         ADL either performed or assessed with clinical judgement   ADL Overall ADL's : Needs assistance/impaired                                       General ADL Comments: supervision for safety, able to complete ambulation unassisted.    Extremity/Trunk Assessment Upper Extremity Assessment Upper Extremity Assessment: Overall WFL for tasks assessed LUE Deficits / Details: full ROM today, no apparent deficits            Vision       Perception     Praxis     Communication Communication Communication: No apparent difficulties   Cognition Arousal: Alert Behavior During Therapy: [facility] for  tasks assessed/performed, Flat affect Cognition: No apparent impairments             OT - Cognition Comments: able to fully participate in therapy, did not formally assess, able to maintain safety awareness, has been ambulating to bathroom independently during the day.                 Following commands: Intact        Cueing   Cueing Techniques: Verbal cues  Exercises      Shoulder Instructions       General Comments      Pertinent Vitals/ Pain       Pain Assessment Pain Assessment: No/denies pain  Home Living                                          Prior Functioning/Environment               Frequency  Min 2X/week        Progress Toward Goals  OT Goals(current goals can now be found in the care plan section)  Progress towards OT goals: Progressing toward goals  Acute Rehab OT Goals Patient Stated Goal: to return home OT Goal Formulation: With patient/family Time For Goal Achievement: 03/17/24 Potential to Achieve Goals: Good ADL Goals Pt Will Perform Upper Body Dressing: with modified independence;sitting Pt Will Perform Lower Body Dressing: with supervision;sitting/lateral leans;sit to/from stand Pt Will Transfer to Toilet: with supervision;ambulating;bedside commode Pt Will Perform Tub/Shower Transfer: Shower transfer;Tub transfer;with supervision;ambulating;shower seat  Plan      Co-evaluation                 AM-PAC OT "6 Clicks" Daily Activity     Outcome Measure   Help from another person eating meals?: None Help from another person taking care of personal grooming?: A Little Help from another person toileting, which includes using toliet, bedpan, or urinal?: A Little Help from another person bathing (including washing, rinsing, drying)?: A Little Help from another person to put on and taking off regular upper body clothing?: A Little Help from another person to put on and taking off regular lower body clothing?: A Little 6 Click Score: 19    End of Session Equipment Utilized During Treatment: Gait belt;Rolling walker (2 wheels)  OT Visit Diagnosis: Unsteadiness on feet (R26.81);Other abnormalities of gait and mobility (R26.89);Muscle weakness (generalized) (M62.81);History of falling (Z91.81);Pain Pain - Right/Left: Left Pain - part of body: Shoulder   Activity Tolerance Patient tolerated treatment well   Patient Left in chair;with call bell/phone within reach;with family/visitor present   Nurse Communication Mobility status        Time: 1610-9604 OT Time Calculation (min): 25 min  Charges: OT General Charges $OT Visit: 1  Visit OT Treatments $Self Care/Home Management : 8-22 mins $Therapeutic Activity: 8-22 mins  Quierra Silverio, OTR/L   [name] 03/09/2024, 2:31 PM

## 2024-03-09 NOTE — Progress Notes (Signed)
 HD#8 SUBJECTIVE:  Patient Summary:Anna Carr is a 83 y.o. with a pertinent PMH of ESRD on HD TThS, T2DM, nonrheumatic mitral valve regurgitation, HFpEF, advanced PVD, anemia of chronic disease, and hx of duodenal/jejunal angioectasias in 2022 who presented with weakness and melenic stool and admitted for concern for UGI bleed.    She experienced brief cardiac arrest during EGD on 03/02/24 and was subsequently intubated and transferred to ICU. Extubated later on 5/8 and IMTS assumed care on medical floor on 5/10.  Overnight Events and Interim History: Some MSK chest pain overnight. No complaints this am. Seen in dialysis. Good spirits. Awaiting CIR decision.  Significant Hospital Events:  5/8 Cardiac arrest during EGD on 5/8, intubated in ICU 5/8 Extubated 5/10 Returned to floor care  OBJECTIVE:  Vital Signs: Vitals:   03/09/24 1000 03/09/24 1030 03/09/24 1104 03/09/24 1118  BP: (!) 122/58 (!) 96/44 (!) 121/51 136/71  Pulse: 79 74 86 82  Resp: 19 15 14 16   Temp:    97.9 F (36.6 C)  TempSrc:      SpO2: 96% 94% 97% 99%  Weight:    73.1 kg  Height:       Supplemental O2: Room Air SpO2: 99 % O2 Flow Rate (L/min): 2 L/min FiO2 (%): 50 %  Filed Weights   03/09/24 0500 03/09/24 0727 03/09/24 1118  Weight: 79.4 kg 76.1 kg 73.1 kg     Intake/Output Summary (Last 24 hours) at 03/09/2024 1305 Last data filed at 03/09/2024 1118 Gross per 24 hour  Intake 580 ml  Output 3000 ml  Net -2420 ml   Net IO Since Admission: -5,113.08 mL [03/09/24 1305]  Physical Exam: Physical Exam Constitutional:      General: She is not in acute distress.    Appearance: She is not ill-appearing.  Cardiovascular:     Rate and Rhythm: Normal rate and regular rhythm.     Pulses: Normal pulses.  Abdominal:     General: Abdomen is flat. Bowel sounds are normal.     Tenderness: There is no abdominal tenderness. There is no guarding.  Musculoskeletal:        General: Tenderness present. No  swelling or deformity.     Comments: L shoulder pain localized to L medial scapular area with mild TTP, no swelling or deformity. Skin:    General: Skin is warm and dry.  Neurological:     General: No focal deficit present.     Mental Status: She is alert.   Patient Lines/Drains/Airways Status     Active Line/Drains/Airways     Name Placement date Placement time Site Days   Peripheral IV 03/05/24 20 G 1.88" Right;Anterior Forearm 03/05/24  1106  Forearm  4   Fistula / Graft Left Upper arm Arteriovenous vein graft 03/12/21  1254  Upper arm  1093   Wound / Incision (Open or Dehisced) 02/26/24 Other (Comment) Knee Right;Left;Anterior bilateral knee abrasion 02/26/24  1900  Knee  12            ASSESSMENT/PLAN:  Assessment: Principal Problem:   Acute on chronic anemia Active Problems:   Type 2 diabetes mellitus with chronic kidney disease on chronic dialysis, with long-term current use of insulin  ([name])   ESRD on dialysis ([name])   Angiodysplasia of small intestine   Symptomatic anemia   Cardiac arrest [facility])   Musculoskeletal chest pain   AVM (arteriovenous malformation) of duodenum, acquired   Acute upper GI bleed   Acute pain of left shoulder  Anna Carr is a 83 y.o. with a pertinent PMH of ESRD on HD TThS, T2DM, nonrheumatic mitral valve regurgitation, HFpEF, advanced PVD, anemia of chronic disease, and hx of duodenal/jejunal angioectasias in 2022 who presented with weakness and melenic stool and admitted for concern for UGI bleed.   She experienced brief cardiac arrest during EGD on 03/02/24 and was subsequently intubated and transferred to ICU. Extubated later on 5/8 and IMTS assumed care on medical floor on 5/10.   Discharge planning underway. Remains medically well with no further active treatment.  Plan: Acute on Chronic Anemia from Acute Upper GI bleed (stable) Hx of duodenal/jejunal angioectasias Hx Diverticulosis (2022)  This was her presenting issue. Hg  remains 9 again and no evidence of bleeding. EGD on 5/8 with argon plasma coagulation of non bleeding AVMs in the duodenum. She chronically has black stools. - transfusion goal > 7 - Will likely resume her aspirin  for PAD but would stop for good if she bleeds again in future - High dose PPI BID - continue to work with PT/OT for strength and endurance - Iron  infusions - Medically stable, pending CIR appeal   L shoulder pain, musculoskeletal Improving. Etiology from moving herself in bed, possible influence from her recent CPR. XR unrevealing.  - Voltaren Gel & tylenol  & robaxin for breakthrough, aquathermia, encouraged gentle exercise - PT/OT   Intra-procedural Cardiac Arrest during EGD with CPR Chest pain MSK from CPR Likely vagal with intra-procedural bradycardia and arrest. Short duration of CPR until ROSC. Spent two nights in ICU, was extubated 5/8 and transferred 5/10. Pt no longer candidate for endoscopy. - lidocaine  patch, tylenol , oxycodone  prn for chest wall pain - Dys diet diet per speech - At risk for aspiration, will monitor respiratory status, which continues to be stable    Stable Issues   Chronic Hyponatremia As low as 120 early in course but improved with return to diet, potentially due to recently being NPO and ESRD. Baseline 127-130 range. No symptoms. - Ensure feeding supplement   ESRD on HD TThS Resume dialysis schedule. Monitor fluid balance and electrolytes. - No piccs   T2DM Patient was hypoglycemic earlier in hospitalization and so maintenance insulin  stopped. Resumed at lower dose when am sugars trended above 200. -continue to monitor glucose, continue SSI -A1c below 6, on 12 semglee  PTH, restarted at 6 units daily   HFpEF Stable, not in exacerbation. Last echo 12/2023 with 60-65 EF and normal function of the LV. Has moderate to severe MR. No aortic stenosis.  - Continue lasix  and resume coreg  near discharge - Daily weights, I&Os. Net -5L since admit due  to HD    Best Practice: Diet: Regular IVF: n/a VTE: SCDs Start: 03/02/24 1129 SCDs Start: 02/25/24 1331 Code: Full AB: n/a Pain Medicine: Tylenol , lidocaine  patch, aquathermia Bowel Regimen: Miralax  daily, senokot  Therapy Recs: CIR vs SNF DISPO: Anticipated discharge in 1-3 days to CIR vs SNF pending insurance and bed availability.    Signature: Carleen Chary, D.O.  Internal Medicine Resident, PGY-1 [facility]  Pager: # [phone]. 1:05 PM, 03/09/2024

## 2024-03-10 DIAGNOSIS — N186 End stage renal disease: Secondary | ICD-10-CM | POA: Diagnosis not present

## 2024-03-10 DIAGNOSIS — K31811 Angiodysplasia of stomach and duodenum with bleeding: Secondary | ICD-10-CM | POA: Diagnosis not present

## 2024-03-10 DIAGNOSIS — Z66 Do not resuscitate: Secondary | ICD-10-CM | POA: Diagnosis not present

## 2024-03-10 DIAGNOSIS — D62 Acute posthemorrhagic anemia: Secondary | ICD-10-CM | POA: Diagnosis not present

## 2024-03-10 DIAGNOSIS — I132 Hypertensive heart and chronic kidney disease with heart failure and with stage 5 chronic kidney disease, or end stage renal disease: Secondary | ICD-10-CM | POA: Diagnosis not present

## 2024-03-10 LAB — CBC
HCT: 28.4 % — ABNORMAL LOW (ref 36.0–46.0)
Hemoglobin: 9.1 g/dL — ABNORMAL LOW (ref 12.0–15.0)
MCH: 32.5 pg (ref 26.0–34.0)
MCHC: 32 g/dL (ref 30.0–36.0)
MCV: 101.4 fL — ABNORMAL HIGH (ref 80.0–100.0)
Platelets: 204 10*3/uL (ref 150–400)
RBC: 2.8 MIL/uL — ABNORMAL LOW (ref 3.87–5.11)
RDW: 18.8 % — ABNORMAL HIGH (ref 11.5–15.5)
WBC: 6.5 10*3/uL (ref 4.0–10.5)
nRBC: 0 % (ref 0.0–0.2)

## 2024-03-10 LAB — GLUCOSE, CAPILLARY
Glucose-Capillary: 122 mg/dL — ABNORMAL HIGH (ref 70–99)
Glucose-Capillary: 200 mg/dL — ABNORMAL HIGH (ref 70–99)
Glucose-Capillary: 350 mg/dL — ABNORMAL HIGH (ref 70–99)
Glucose-Capillary: 257 mg/dL — ABNORMAL HIGH (ref 70–99)
Glucose-Capillary: 194 mg/dL — ABNORMAL HIGH (ref 70–99)

## 2024-03-10 LAB — RENAL FUNCTION PANEL
Albumin: 2.4 g/dL — ABNORMAL LOW (ref 3.5–5.0)
Anion gap: 12 (ref 5–15)
BUN: 36 mg/dL — ABNORMAL HIGH (ref 8–23)
CO2: 27 mmol/L (ref 22–32)
Calcium: 8.8 mg/dL — ABNORMAL LOW (ref 8.9–10.3)
Chloride: 90 mmol/L — ABNORMAL LOW (ref 98–111)
Creatinine, Ser: 4.73 mg/dL — ABNORMAL HIGH (ref 0.44–1.00)
GFR, Estimated: 9 mL/min — ABNORMAL LOW (ref >60)
Glucose, Bld: 137 mg/dL — ABNORMAL HIGH (ref 70–99)
Phosphorus: 3 mg/dL (ref 2.5–4.6)
Potassium: 5.4 mmol/L — ABNORMAL HIGH (ref 3.5–5.1)
Sodium: 129 mmol/L — ABNORMAL LOW (ref 135–145)

## 2024-03-10 LAB — BASIC METABOLIC PANEL WITH GFR
Anion gap: 14 (ref 5–15)
BUN: 45 mg/dL — ABNORMAL HIGH (ref 8–23)
CO2: 25 mmol/L (ref 22–32)
Calcium: 9 mg/dL (ref 8.9–10.3)
Chloride: 87 mmol/L — ABNORMAL LOW (ref 98–111)
Creatinine, Ser: 5.4 mg/dL — ABNORMAL HIGH (ref 0.44–1.00)
GFR, Estimated: 7 mL/min — ABNORMAL LOW (ref >60)
Glucose, Bld: 268 mg/dL — ABNORMAL HIGH (ref 70–99)
Potassium: 4.8 mmol/L (ref 3.5–5.1)
Sodium: 126 mmol/L — ABNORMAL LOW (ref 135–145)

## 2024-03-10 LAB — POTASSIUM: Potassium: 6.2 mmol/L — ABNORMAL HIGH (ref 3.5–5.1)

## 2024-03-10 MED ORDER — DEXTROSE 50 % IV SOLN
1.0000 | Freq: Once | INTRAVENOUS | Status: AC
  Administered 2024-03-10: 50 mL via INTRAVENOUS
  Filled 2024-03-10: qty 50

## 2024-03-10 MED ORDER — SODIUM ZIRCONIUM CYCLOSILICATE 10 G PO PACK
10.0000 g | PACK | Freq: Once | ORAL | Status: AC
  Administered 2024-03-10: 10 g via ORAL
  Filled 2024-03-10: qty 1

## 2024-03-10 MED ORDER — CALCIUM GLUCONATE-NACL 1-0.675 GM/50ML-% IV SOLN
1.0000 g | Freq: Once | INTRAVENOUS | Status: AC
  Administered 2024-03-10: 1000 mg via INTRAVENOUS
  Filled 2024-03-10: qty 50

## 2024-03-10 MED ORDER — CHLORHEXIDINE GLUCONATE CLOTH 2 % EX PADS: 6.0000 | MEDICATED_PAD | Freq: Every day | CUTANEOUS | Status: DC

## 2024-03-10 MED ORDER — INSULIN ASPART 100 UNIT/ML IV SOLN
5.0000 [IU] | Freq: Once | INTRAVENOUS | Status: AC
  Administered 2024-03-10: 5 [IU] via INTRAVENOUS

## 2024-03-10 NOTE — Discharge Instructions (Addendum)
 You were hospitalized for GI bleed which has resolved. No further endoscopies due to cardiac arrest. Your blood counts (hemoglobin) remain stable. You received IV iron  and continued dialysis in hospital. Your chest wall pain and shoulder pain will continue to improve. Please resume your home physical therapy. Thank you for allowing us  to be part of your care.   Schedule follow-up appointments: Abbe Hoard., MD (Call their office for hospital follow up in 1-2 weeks)  Please note these changes made to your medications:  *Please START taking:  -Soliqua  (Insulin  Glargine-Lixisenatide ) 6 units daily (REDUCED from previous dose)  -Protonix  (pantoprazole ) 40 mg twice a day  -Tessalon  200 mg three times a day as needed for cough -Lidocaine  patch for muscle pain/chest wall pain -Voltaren  gel apply 4 times a day to areas of pain -Muscle rub (Bengay) twice a day to shoulder -Tylenol  [phone] every 8 hours as needed for pain   *Please STOP taking:  -Aspirin  81 mg (until your primary care follow-up appt) -Coreg  6.25 mg (until your primary care follow-up appt)  Please make sure to schedule hospital follow-up appointment with your primary doctor Eluterio Hamburg, Murvin Arthurs., MD).

## 2024-03-10 NOTE — Progress Notes (Signed)
 Physical Therapy Treatment Patient Details Name: Anna Carr MRN: [id number] DOB: Aug 29, 1941 Today's Date: 03/10/2024   History of Present Illness 83 yo female who presents 5/2 with a few days of worsening weakness and admitted for concern for UGI bleed. 5/5 small bowel endoscopy. 5/8 endoscopy where pt had cardiac arrest requiring brief CPR with broken ribs. 5/8 admit to ICU. Multiple prior falls. PMH: ESRD on HD TThS, T2DM, nonrheumatic mitral valve regurgitation, HFpEF, advanced PVD, anemia of chronic disease, and hx of duodenal/jejunal angioectasias in 2022.    PT Comments  Patient is making progress with functional independence. She was able to stand from various surfaces with no physical assistance required. Standing and ambulation distance is limited by fatigue. Mild dyspnea with exertion with energy conservation techniques encouraged. Patient will likely require intermittent initial assistance for safe return home. Recommend to continue PT to maximize independence and facilitate return to prior level of function.    If plan is discharge home, recommend the following: A little help with walking and/or transfers;A little help with bathing/dressing/bathroom;Assist for transportation;Help with stairs or ramp for entrance   Can travel by private vehicle        Equipment Recommendations  None recommended by PT    Recommendations for Other Services       Precautions / Restrictions Precautions Precautions: Fall Recall of Precautions/Restrictions: Intact Precaution/Restrictions Comments: rib fxs, L shoulder tender/sore Restrictions Weight Bearing Restrictions Per Provider Order: No     Mobility  Bed Mobility               General bed mobility comments: not assessed as patient sitting up on arrival and post session    Transfers Overall transfer level: Needs assistance Equipment used: Rolling walker (2 wheels) Transfers: Sit to/from Stand Sit to Stand: Supervision            General transfer comment: no physical assistance required for standing from various surfaces including recliner, bed, and bed side commode    Ambulation/Gait Ambulation/Gait assistance: Supervision, Contact guard assist Gait Distance (Feet):  (33ft, 28ft) Assistive device: Rolling walker (2 wheels) Gait Pattern/deviations: Step-through pattern Gait velocity: decreased     General Gait Details: mild dyspnea with exertion. energy conservation strategies encouraged. patient is fatigued with mobility.   Stairs             Wheelchair Mobility     Tilt Bed    Modified Rankin (Stroke Patients Only)       Balance Overall balance assessment: Needs assistance Sitting-balance support: No upper extremity supported Sitting balance-Leahy Scale: Good Sitting balance - Comments: patient able to reach outside base of support and complete hygiene after toileting without loss of balance   Standing balance support: Single extremity supported, Bilateral upper extremity supported, No upper extremity supported Standing balance-Leahy Scale: Poor Standing balance comment: patient relying on the sink for UE support while standing for 1-2 minutes to brush teeth. patient relying on rolling walker for support with walking which is encouraged                            Communication Communication Communication: No apparent difficulties  Cognition Arousal: Alert Behavior During Therapy: WFL for tasks assessed/performed, Flat affect   PT - Cognitive impairments: No apparent impairments                         Following commands: Intact      Cueing  Exercises      General Comments        Pertinent Vitals/Pain Pain Assessment Pain Assessment: Faces Faces Pain Scale: Hurts a little bit Pain Location: L shoulder, chest Pain Descriptors / Indicators: Discomfort Pain Intervention(s): Monitored during session, Limited activity within patient's  tolerance, Repositioned    Home Living                          Prior Function            PT Goals (current goals can now be found in the care plan section) Acute Rehab PT Goals Patient Stated Goal: to just go home PT Goal Formulation: With patient/family Time For Goal Achievement: 03/17/24 Potential to Achieve Goals: Good Progress towards PT goals: Progressing toward goals    Frequency    Min 3X/week      PT Plan      Co-evaluation              AM-PAC PT "6 Clicks" Mobility   Outcome Measure  Help needed turning from your back to your side while in a flat bed without using bedrails?: A Little Help needed moving from lying on your back to sitting on the side of a flat bed without using bedrails?: A Little Help needed moving to and from a bed to a chair (including a wheelchair)?: A Little Help needed standing up from a chair using your arms (e.g., wheelchair or bedside chair)?: A Little Help needed to walk in hospital room?: A Little Help needed climbing 3-5 steps with a railing? : A Lot 6 Click Score: 17    End of Session   Activity Tolerance: Patient tolerated treatment well Patient left: in chair;with call bell/phone within reach;with chair alarm set Nurse Communication: Mobility status PT Visit Diagnosis: Other abnormalities of gait and mobility (R26.89);Muscle weakness (generalized) (M62.81);History of falling (Z91.81)     Time: 1610-9604 PT Time Calculation (min) (ACUTE ONLY): 12 min  Charges:    $Therapeutic Activity: 8-22 mins PT General Charges $$ ACUTE PT VISIT: 1 Visit                     Ozie Bo, PT, MPT   Anna Carr 03/10/2024, 12:27 PM

## 2024-03-10 NOTE — Progress Notes (Signed)
 Contacted attending to inquire about possible d/c today. Attending states potassium is elevated and they plan to keep pt overnight and will receive HD here tomorrow. Will assist as needed.   Lauraine Polite Renal Navigator [phone]

## 2024-03-10 NOTE — TOC Progression Note (Signed)
 Transition of Care [facility]) - Progression Note    Patient Details  Name: Anna Carr MRN: [id number] Date of Birth: 07/17/1941  [facility]) CM/SW Contact  Tom-Johnson, Bentleigh Stankus Daphne, RN Phone Number: 03/10/2024, 4:57 PM  Clinical Narrative:     Appeal for CIR admission was denied by patient's Insurance carrier. Patient and family requests to go home with home health. CM spoke with patient's daughter [name] and she informed CM that patient is currently active with [facility] and would like to resume disciplines at discharge. Home health order placed and info on AVS.  Patient not Medically ready for discharge.  CM will continue to follow as patient progresses with care towards discharge.            .   Expected Discharge Plan: IP Rehab Facility Barriers to Discharge: Continued Medical Work up, English as a second language teacher  Expected Discharge Plan and Services       Living arrangements for the past 2 months: Single Family Home                                       Social Determinants of Health (SDOH) Interventions SDOH Screenings   Food Insecurity: No Food Insecurity (02/26/2024)  Housing: Low Risk  (02/26/2024)  Transportation Needs: No Transportation Needs (02/26/2024)  Utilities: Not At Risk (02/26/2024)  Financial Resource Strain: Low Risk  (10/27/2021)   Received from [facility]  Social Connections: Socially Isolated (03/04/2024)  Stress: No Stress Concern Present (10/27/2021)   Received from [facility], [facility]  Tobacco Use: Medium Risk (03/02/2024)    Readmission Risk Interventions     No data to display

## 2024-03-10 NOTE — Progress Notes (Signed)
 [facility] Progress Note   Subjective:   Patient seen in her room today. She did not have any family at bedside. Her only complaint is that her LE swelling is worse today. She is requesting to have more fluid removed during HD to help out with her swelling. Possible d/c today. Her K is elevated today at 6.2. I explained that her OP HD facility can challenge her with UF to help remove some fluid. BP stable today.   Objective Vitals:   03/09/24 1637 03/09/24 1944 03/10/24 0444 03/10/24 0808  BP: 117/72 136/63 (!) 112/56 (!) 133/59  Pulse: 97 96 69 (!) 101  Resp: 19 19 18 18   Temp: 98.3 F (36.8 C) 97.9 F (36.6 C) 98.2 F (36.8 C) 98.4 F (36.9 C)  TempSrc:  Oral Oral   SpO2: 97% 100% 94% 99%  Weight:      Height:       Physical Exam General: Sitting in chair, nad  Heart: RRR, no MGR Lungs: Clear bilaterally  Abdomen: Soft, non-distended Extremities: no LE edema  Dialysis Access: LUE AVG +bruit   Filed Weights   03/09/24 0500 03/09/24 0727 03/09/24 1118  Weight: 79.4 kg 76.1 kg 73.1 kg    Intake/Output Summary (Last 24 hours) at 03/10/2024 1407 Last data filed at 03/10/2024 1229 Gross per 24 hour  Intake 460 ml  Output 0 ml  Net 460 ml    Additional Objective Labs: Basic Metabolic Panel: Recent Labs  Lab 03/08/24 0502 03/09/24 0538 03/10/24 0607 03/10/24 1023  NA 128* 124* 129*  --   K 4.2 4.5 5.4* 6.2*  CL 91* 88* 90*  --   CO2 26 26 27   --   GLUCOSE 161* 144* 137*  --   BUN 37* 52* 36*  --   CREATININE 4.72* 6.03* 4.73*  --   CALCIUM  8.6* 8.6* 8.8*  --   PHOS 3.3 3.8 3.0  --    Liver Function Tests: Recent Labs  Lab 03/08/24 0502 03/09/24 0538 03/10/24 0607  ALBUMIN  2.4* 2.4* 2.4*   No results for input(s): "LIPASE", "AMYLASE" in the last 168 hours.  CBC: Recent Labs  Lab 03/04/24 0757 03/05/24 0500 03/06/24 0426 03/07/24 0503 03/08/24 0502 03/09/24 0744 03/10/24 0607  WBC 7.6 7.3 6.2  --   --  7.0 6.5  HGB 9.4* 9.4*  8.3*   < > 9.0* 9.1* 9.1*  HCT 28.1* 28.7* 25.6*   < > 28.6* 28.1* 28.4*  MCV 99.3 102.5* 103.2*  --   --  100.0 101.4*  PLT 177 162 153  --   --  183 204   < > = values in this interval not displayed.   Blood Culture    Component Value Date/Time   SDES BLOOD RIGHT HAND 03/16/2021 0046   SPECREQUEST  03/16/2021 0046    BOTTLES DRAWN AEROBIC ONLY Blood Culture results may not be optimal due to an inadequate volume of blood received in culture bottles   CULT  03/16/2021 0046    NO GROWTH 5 DAYS Performed at [facility], [address], [place], [place] [address]    REPTSTATUS 03/21/2021 FINAL 03/16/2021 0046    Cardiac Enzymes: No results for input(s): "CKTOTAL", "CKMB", "CKMBINDEX", "TROPONINI" in the last 168 hours. CBG: Recent Labs  Lab 03/09/24 1327 03/09/24 1705 03/09/24 2055 03/10/24 0809 03/10/24 1112  GLUCAP 102* 373* 203* 122* 200*   Iron  Studies: No results for input(s): "IRON ", "TIBC", "TRANSFERRIN", "FERRITIN" in the last 72  hours. Lab Results  Component Value Date   INR 1.4 (H) 03/02/2024   INR 1.0 03/29/2021     Medications:  calcium  gluconate        acetaminophen   1,000 mg Oral Q8H   atorvastatin   10 mg Oral Daily   benzonatate   200 mg Oral TID   Chlorhexidine  Gluconate Cloth  6 each Topical Q0600   Chlorhexidine  Gluconate Cloth  6 each Topical Q0600   darbepoetin (ARANESP ) injection - DIALYSIS  100 mcg Subcutaneous Q Thu-1800   insulin  aspart  5 Units Intravenous Once   And   dextrose   1 ampule Intravenous Once   diclofenac Sodium  4 g Topical QID   feeding supplement  237 mL Oral BID BM   ferric citrate   210 mg Oral TID WC   furosemide   80 mg Oral Daily   insulin  aspart  0-5 Units Subcutaneous QHS   insulin  aspart  0-6 Units Subcutaneous TID WC   insulin  glargine-yfgn  6 Units Subcutaneous Daily   lidocaine   1 patch Transdermal Q24H   Muscle Rub   Topical BID   pantoprazole   40 mg Oral BID   polyethylene glycol  17 g Oral Daily    senna-docusate  2 tablet Oral Daily   sodium zirconium cyclosilicate   10 g Oral Once    Dialysis Orders: [facility] 3.5h   B400    70.6kg    LUA AVG   Heparin  none Last OP HD 5/01, post wt 70.9 Comes off 0-2kg over, bp's wnl on HD Mircera 200mcg IV q 2 weeks- last dose 02/24/24  Assessment/Plan: 83 year old woman with ESRD on HD who presented with acute blood loss anemia, found to have GI bleed on capsule endoscopy.  Underwent EGD 5/8.  EGD was complicated by bradycardic arrest. She was transferred to the ICU postprocedure.   Symptomatic anemia/GI bleed.  Hb 6.6 on admission. 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 GI recommends against future endoscopy d/t procedural bradycardia. Supportive management going forward. Serial H/H. Transfuse prn Hgb <7.  Cardiac arrest:  2/2 hypotension and bradycardia. S/p brief CPR with rib fractures. Now extubated and on room air.  Hyponatremia: chronic mild hypoNa high 120s low 130s. Improved with HD yesterday. Na 129 today.  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  Next HD 5/17 HTN:  BP stable today Volume: trace edema today.  Anemia of esrd + ABLA:  s/p 2 units PRBC 5/2. ESA given 02/24/24 --Next due 5/15.  Hb 9s.  Aranesp  ordered for 03/09/24 Secondary hyperparathyroidism: Cor Ca and phos in range, cont home binders ac tid.  IDDM: per pmd Generalized weakness. Now appears to be going home with [facility]. Continue protein supplements. Alb 2.4   Hersey Lorenzo, NP [facility] 03/10/2024,2:07 PM

## 2024-03-10 NOTE — Inpatient Diabetes Management (Signed)
 Inpatient Diabetes Program Recommendations  AACE/ADA: New Consensus Statement on Inpatient Glycemic Control (2015)  Target Ranges:  Prepandial:   less than 140 mg/dL      Peak postprandial:   less than 180 mg/dL (1-2 hours)      Critically ill patients:  140 - 180 mg/dL   Lab Results  Component Value Date   GLUCAP 200 (H) 03/10/2024   HGBA1C 5.6 02/25/2024    Review of Glycemic Control  Latest Reference Range & Units 03/09/24 13:27 03/09/24 17:05 03/09/24 20:55 03/10/24 08:09 03/10/24 11:12  Glucose-Capillary 70 - 99 mg/dL 664 (H) 403 (H) 474 (H) 122 (H) 200 (H)   Diabetes history: DM 2 Outpatient Diabetes medications:  Soliqua  12 units daily Current orders for Inpatient glycemic control:  Novolog  0-6 units tid with meals and HS Semglee  6 units daily  Note: Fasting glucose at goal. Postprandial trends increase  Inpatient Diabetes Program Recommendations:    -  Consider adding Novolog  2 units tid with meals (hold if patient eats less than 50% or NPO).  Thanks  Eloise Hake RN, MSN, BC-ADM Inpatient Diabetes Coordinator Team Pager [phone] (8a-5p)

## 2024-03-10 NOTE — Discharge Planning (Signed)
 [facility] Patient Discharge Orders - [facility] CLINIC: [facility]  Patient's name: Anna Carr Admit/DC Dates: 02/25/2024 -   DISCHARGE DIAGNOSES: Acute on chronic symptomatic anemia  Cardiac arrest  HD ORDER CHANGES: Heparin  change: no heparin  EDW Change: yes New EDW: 73 kg Bath Change: no change  ANEMIA MANAGEMENT: Aranesp : Given: yes   Amount/Date of last dose: 100 mcg given on 03/09/24 ESA dose for discharge: mircera per protocol IV Iron  dose at discharge: n/a Transfusion: Given: 3U given on 02/25/24 x2 and 03/02/24 x1  BONE/MINERAL MEDICATIONS: Hectorol/Calcitriol change: per protocol Sensipar/Parsabiv change: per protocol  ACCESS INTERVENTION/CHANGE: no change Details:   RECENT LABS: Recent Labs  Lab 03/10/24 0607 03/10/24 1023  HGB 9.1*  --   NA 129*  --   K 5.4* 6.2*  CALCIUM  8.8*  --   PHOS 3.0  --   ALBUMIN  2.4*  --     IV ANTIBIOTICS: no Details:  OTHER ANTICOAGULATION: On Coumadin?: no Last INR: Managed By:  OTHER/APPTS/LAB ORDERS:   D/C Meds to be reconciled by nurse after every discharge.  Completed By: Hersey Lorenzo, NP-C   Reviewed by: MD:______ RN_______

## 2024-03-10 NOTE — Plan of Care (Signed)
  Problem: Health Behavior/Discharge Planning: Goal: Ability to manage health-related needs will improve Outcome: Progressing   Problem: Clinical Measurements: Goal: Ability to maintain clinical measurements within normal limits will improve Outcome: Progressing Goal: Will remain free from infection Outcome: Progressing Goal: Diagnostic test results will improve Outcome: Progressing Goal: Respiratory complications will improve Outcome: Progressing Goal: Cardiovascular complication will be avoided Outcome: Progressing   Problem: Activity: Goal: Risk for activity intolerance will decrease Outcome: Progressing   Problem: Nutrition: Goal: Adequate nutrition will be maintained Outcome: Progressing   Problem: Coping: Goal: Level of anxiety will decrease Outcome: Progressing   Problem: Elimination: Goal: Will not experience complications related to bowel motility Outcome: Progressing Goal: Will not experience complications related to urinary retention Outcome: Progressing   Problem: Pain Managment: Goal: General experience of comfort will improve and/or be controlled Outcome: Progressing   Problem: Safety: Goal: Ability to remain free from injury will improve Outcome: Progressing   Problem: Skin Integrity: Goal: Risk for impaired skin integrity will decrease Outcome: Progressing   Problem: Education: Goal: Ability to describe self-care measures that may prevent or decrease complications (Diabetes Survival Skills Education) will improve Outcome: Progressing Goal: Individualized Educational Video(s) Outcome: Progressing   Problem: Coping: Goal: Ability to adjust to condition or change in health will improve Outcome: Progressing   Problem: Fluid Volume: Goal: Ability to maintain a balanced intake and output will improve Outcome: Progressing   Problem: Health Behavior/Discharge Planning: Goal: Ability to identify and utilize available resources and services will  improve Outcome: Progressing Goal: Ability to manage health-related needs will improve Outcome: Progressing   Problem: Metabolic: Goal: Ability to maintain appropriate glucose levels will improve Outcome: Progressing   Problem: Nutritional: Goal: Maintenance of adequate nutrition will improve Outcome: Progressing Goal: Progress toward achieving an optimal weight will improve Outcome: Progressing   Problem: Skin Integrity: Goal: Risk for impaired skin integrity will decrease Outcome: Progressing   Problem: Tissue Perfusion: Goal: Adequacy of tissue perfusion will improve Outcome: Progressing  K improved p interventions.

## 2024-03-10 NOTE — Progress Notes (Signed)
 HD#9 SUBJECTIVE:  Patient Summary:Anna Carr is a 83 y.o. with a pertinent PMH of ESRD on HD TThS, T2DM, nonrheumatic mitral valve regurgitation, HFpEF, advanced PVD, anemia of chronic disease, and hx of duodenal/jejunal angioectasias in 2022 who presented with weakness and melenic stool and admitted for concern for UGI bleed.    She experienced brief cardiac arrest during EGD on 03/02/24 and was subsequently intubated and transferred to ICU. Extubated later on 5/8 and IMTS assumed care on medical floor on 5/10.  Overnight Events and Interim History:  Denies any new concerns. Still has some shoulder pain but better with muscle rub cream. No worsening cough. No fever or SOB. No abdominal pain. Would prefer HH over SNF given denied for CIR appeal.   Significant Hospital Events:  5/8 Cardiac arrest during EGD on 5/8, intubated in ICU 5/8 Extubated 5/10 Returned to floor care  OBJECTIVE:  Vital Signs: Vitals:   03/09/24 1118 03/09/24 1637 03/09/24 1944 03/10/24 0444  BP: 136/71 117/72 136/63 (!) 112/56  Pulse: 82 97 96 69  Resp: 16 19 19 18   Temp: 97.9 F (36.6 C) 98.3 F (36.8 C) 97.9 F (36.6 C) 98.2 F (36.8 C)  TempSrc:   Oral Oral  SpO2: 99% 97% 100% 94%  Weight: 73.1 kg     Height:       Supplemental O2: Room Air SpO2: 94 % O2 Flow Rate (L/min): 2 L/min FiO2 (%): 50 %  Filed Weights   03/09/24 0500 03/09/24 0727 03/09/24 1118  Weight: 79.4 kg 76.1 kg 73.1 kg     Intake/Output Summary (Last 24 hours) at 03/10/2024 1610 Last data filed at 03/09/2024 1832 Gross per 24 hour  Intake --  Output 3000 ml  Net -3000 ml   Net IO Since Admission: -5,113.08 mL [03/10/24 0639]  Physical Exam: Physical Exam Constitutional:      General: She is not in acute distress.    Appearance: She is not ill-appearing.  Cardiovascular:     Rate and Rhythm: Regular rate and rhythm  Musculoskeletal:        General: Tenderness present. No swelling or deformity.     Comments: L  shoulder pain localized to L medial scapular area with mild TTP, no swelling or deformity. Skin:    General: Skin is warm and dry.  Neurological:     General: No focal deficit present.     Mental Status: She is alert.   Patient Lines/Drains/Airways Status     Active Line/Drains/Airways     Name Placement date Placement time Site Days   Peripheral IV 03/05/24 20 G 1.88" Right;Anterior Forearm 03/05/24  1106  Forearm  4   Fistula / Graft Left Upper arm Arteriovenous vein graft 03/12/21  1254  Upper arm  1093   Wound / Incision (Open or Dehisced) 02/26/24 Other (Comment) Knee Right;Left;Anterior bilateral knee abrasion 02/26/24  1900  Knee  12            ASSESSMENT/PLAN:  Assessment: Principal Problem:   Acute on chronic anemia Active Problems:   Type 2 diabetes mellitus with chronic kidney disease on chronic dialysis, with long-term current use of insulin  ([name])   ESRD on dialysis ([name])   Angiodysplasia of small intestine   Symptomatic anemia   Cardiac arrest [facility])   Musculoskeletal chest pain   AVM (arteriovenous malformation) of duodenum, acquired   Acute upper GI bleed   Acute pain of left shoulder  Anna Carr is a 83 y.o. with a  pertinent PMH of ESRD on HD TThS, T2DM, nonrheumatic mitral valve regurgitation, HFpEF, advanced PVD, anemia of chronic disease, and hx of duodenal/jejunal angioectasias in 2022 who presented with weakness and melenic stool and admitted for concern for UGI bleed.   She experienced brief cardiac arrest during EGD on 03/02/24 and was subsequently intubated and transferred to ICU. Extubated later on 5/8 and IMTS assumed care on medical floor on 5/10.  Plan: Acute on Chronic Anemia from Acute Upper GI bleed (stable) Hx of duodenal/jejunal angioectasias Hx Diverticulosis (2022)  This was her presenting issue. Hg stable at 9 and no evidence of bleeding. EGD on 5/8 with argon plasma coagulation of non bleeding AVMs in the duodenum. She chronically  has black stools. - transfusion goal > 7 - Holding her home ASA, needs f/u with PCP given recurrent hx of GIB - PPI 40 mg BID - continue to work with PT/OT for strength and endurance - Medically stable, was denied CIR, prefers HH, plan for d/c today pending K level  Hyperkalemia K mildly up at 5.4. HD yesterday. Will repeat K this morning, address based on repeat level.    L shoulder pain, musculoskeletal Improving. Etiology from moving herself in bed, possible influence from her recent CPR. XR unrevealing.  - Voltaren Gel & tylenol  & robaxin for breakthrough, aquathermia, encouraged gentle exercise - PT/OT   Intra-procedural Cardiac Arrest during EGD with CPR Chest pain MSK from CPR Likely vagal with intra-procedural bradycardia and arrest. Short duration of CPR until ROSC. Spent two nights in ICU, was extubated 5/8 and transferred 5/10. Pt no longer candidate for endoscopy. - lidocaine  patch, tylenol , oxycodone  prn for chest wall pain - Dys diet diet per speech - At risk for aspiration, will monitor respiratory status, which continues to be stable    Stable Issues Chronic Hyponatremia As low as 120 early in course but improved with return to diet, potentially due to recently being NPO and ESRD. Baseline 127-130 range. No symptoms. - Ensure feeding supplement   ESRD on HD TThS Resume dialysis schedule. Monitor fluid balance and electrolytes.   T2DM Patient was hypoglycemic earlier in hospitalization and so maintenance insulin  stopped. Resumed at lower dose when am sugars trended above 200. -continue to monitor glucose, continue SSI -A1c below 6, on 12 semglee  PTH, restarted at 6 units daily   HFpEF Stable, not in exacerbation. Last echo 12/2023 with 60-65 EF and normal function of the LV. Has moderate to severe MR. No aortic stenosis.  - Continue lasix  and resume coreg  near discharge - Daily weights, I&Os. On HD.    Best Practice: Diet: Regular IVF: n/a VTE: SCDs Start:  03/02/24 1129 SCDs Start: 02/25/24 1331 Code: Full AB: n/a Pain Medicine: Tylenol , lidocaine  patch, aquathermia Bowel Regimen: Miralax  daily, senokot  Therapy Recs: CIR vs SNF vs HH DISPO: Anticipated discharge in 0-1 days to Home pending medical stability.    Signature: Jearldine Mina, DO Internal Medicine Resident PGY-2 Pager: # [phone]. 6:39 AM, 03/10/2024

## 2024-03-10 NOTE — Progress Notes (Signed)
 Mobility Specialist Progress Note:   03/10/24 0900  Mobility  Activity Ambulated with assistance to bathroom;Ambulated with assistance in hallway  Level of Assistance Contact guard assist, steadying assist  Assistive Device Front wheel walker  Distance Ambulated (ft) 80 ft (20+60)  Activity Response Tolerated well  Mobility Referral Yes  Mobility visit 1 Mobility  Mobility Specialist Start Time (ACUTE ONLY) 0911  Mobility Specialist Stop Time (ACUTE ONLY) [id number]  Mobility Specialist Time Calculation (min) (ACUTE ONLY) 11 min   Pt received in chair and agreeable. C/o persistent L shoulder pain. Successful void in BR before hallway ambulation. C/o fatigue at EOS. VSS throughout. Pt left in chair with call bell and all needs met. Chair alarm on.  D'Vante Nolon Baxter Mobility Specialist Please contact via Special educational needs teacher or Rehab office at [phone]

## 2024-03-11 ENCOUNTER — Other Ambulatory Visit (HOSPITAL_COMMUNITY): Payer: Self-pay

## 2024-03-11 DIAGNOSIS — K31811 Angiodysplasia of stomach and duodenum with bleeding: Principal | ICD-10-CM

## 2024-03-11 DIAGNOSIS — D62 Acute posthemorrhagic anemia: Secondary | ICD-10-CM | POA: Diagnosis not present

## 2024-03-11 LAB — RENAL FUNCTION PANEL
Albumin: 2.3 g/dL — ABNORMAL LOW (ref 3.5–5.0)
Anion gap: 15 (ref 5–15)
BUN: 53 mg/dL — ABNORMAL HIGH (ref 8–23)
CO2: 24 mmol/L (ref 22–32)
Calcium: 9 mg/dL (ref 8.9–10.3)
Chloride: 86 mmol/L — ABNORMAL LOW (ref 98–111)
Creatinine, Ser: 5.91 mg/dL — ABNORMAL HIGH (ref 0.44–1.00)
GFR, Estimated: 7 mL/min — ABNORMAL LOW (ref >60)
Glucose, Bld: 129 mg/dL — ABNORMAL HIGH (ref 70–99)
Phosphorus: 3.4 mg/dL (ref 2.5–4.6)
Potassium: 4.9 mmol/L (ref 3.5–5.1)
Sodium: 125 mmol/L — ABNORMAL LOW (ref 135–145)

## 2024-03-11 LAB — GLUCOSE, CAPILLARY
Glucose-Capillary: 132 mg/dL — ABNORMAL HIGH (ref 70–99)
Glucose-Capillary: 152 mg/dL — ABNORMAL HIGH (ref 70–99)
Glucose-Capillary: 108 mg/dL — ABNORMAL HIGH (ref 70–99)

## 2024-03-11 MED ORDER — PANTOPRAZOLE SODIUM 40 MG PO TBEC
40.0000 mg | DELAYED_RELEASE_TABLET | Freq: Two times a day (BID) | ORAL | 0 refills | Status: DC
  Filled 2024-03-11: qty 60, 30d supply, fill #0

## 2024-03-11 MED ORDER — ALBUMIN HUMAN 25 % IV SOLN
INTRAVENOUS | Status: AC
  Filled 2024-03-11: qty 100

## 2024-03-11 MED ORDER — BENZONATATE 200 MG PO CAPS
200.0000 mg | ORAL_CAPSULE | Freq: Three times a day (TID) | ORAL | 0 refills | Status: DC | PRN
  Filled 2024-03-11: qty 20, 7d supply, fill #0

## 2024-03-11 MED ORDER — ANTICOAGULANT SODIUM CITRATE 4% (200MG/5ML) IV SOLN: 5.0000 mL | Status: DC | PRN

## 2024-03-11 MED ORDER — MUSCLE RUB 10-15 % EX CREA
1.0000 | TOPICAL_CREAM | Freq: Two times a day (BID) | CUTANEOUS | 0 refills | Status: DC
  Filled 2024-03-11: qty 85, 43d supply, fill #0

## 2024-03-11 MED ORDER — LIDOCAINE-PRILOCAINE 2.5-2.5 % EX CREA: 1.0000 | TOPICAL_CREAM | CUTANEOUS | Status: DC | PRN

## 2024-03-11 MED ORDER — SOLIQUA 100-33 UNT-MCG/ML ~~LOC~~ SOPN: 6.0000 [IU] | PEN_INJECTOR | Freq: Every morning | SUBCUTANEOUS | Status: DC

## 2024-03-11 MED ORDER — LIDOCAINE HCL (PF) 1 % IJ SOLN: 5.0000 mL | INTRAMUSCULAR | Status: DC | PRN

## 2024-03-11 MED ORDER — MIDODRINE HCL 5 MG PO TABS
ORAL_TABLET | ORAL | Status: AC
  Filled 2024-03-11: qty 1

## 2024-03-11 MED ORDER — HEPARIN SODIUM (PORCINE) 1000 UNIT/ML DIALYSIS: 1000.0000 [IU] | INTRAMUSCULAR | Status: DC | PRN

## 2024-03-11 MED ORDER — PENTAFLUOROPROP-TETRAFLUOROETH EX AERO: 1.0000 | INHALATION_SPRAY | CUTANEOUS | Status: DC | PRN

## 2024-03-11 MED ORDER — ALTEPLASE 2 MG IJ SOLR
2.0000 mg | Freq: Once | INTRAMUSCULAR | Status: DC | PRN
Start: 2024-03-11 — End: 2024-03-12

## 2024-03-11 MED ORDER — LIDOCAINE 5 % EX PTCH
1.0000 | MEDICATED_PATCH | CUTANEOUS | 0 refills | Status: DC
  Filled 2024-03-11: qty 30, 30d supply, fill #0

## 2024-03-11 NOTE — Discharge Summary (Signed)
 Name: THERSA MOHIUDDIN MRN: [id number] DOB: November 27, 1940 83 y.o. PCP: Abbe Hoard., MD  Date of Admission: 02/25/2024  9:53 AM Date of Discharge: 03/11/2024 Attending Physician: Dr. Alwin Baars  Discharge Diagnosis: Principal Problem:   Acute on chronic anemia Active Problems:   Type 2 diabetes mellitus with chronic kidney disease on chronic dialysis, with long-term current use of insulin  ([name])   ESRD on dialysis ([name])   Angiodysplasia of small intestine   Symptomatic anemia   Cardiac arrest [facility])   Musculoskeletal chest pain   AVM (arteriovenous malformation) of duodenum, acquired   Acute upper GI bleed   Acute pain of left shoulder    Discharge Medications: Allergies as of 03/11/2024   No Known Allergies      Medication List     PAUSE taking these medications    aspirin  EC 81 MG tablet Wait to take this until your doctor or other care provider tells you to start again. Take 1 tablet (81 mg total) by mouth daily. Swallow whole.   carvedilol  6.25 MG tablet Wait to take this until your doctor or other care provider tells you to start again. Commonly known as: COREG  Take 6.25 mg by mouth 2 (two) times daily.       STOP taking these medications    pregabalin 75 MG capsule Commonly known as: LYRICA       TAKE these medications    acetaminophen  500 MG tablet Commonly known as: TYLENOL  Take 500 mg by mouth every 6 (six) hours as needed for mild pain (pain score 1-3) or moderate pain (pain score 4-6).   ascorbic acid 1000 MG tablet Commonly known as: VITAMIN C Take 1,000 mg by mouth daily in the afternoon.   atorvastatin  10 MG tablet Commonly known as: LIPITOR Take 1 tablet (10 mg total) by mouth daily. What changed: when to take this   benzonatate  200 MG capsule Commonly known as: TESSALON  Take 1 capsule (200 mg total) by mouth 3 (three) times daily as needed for cough.   Biotin 5000 MCG Tabs Take 5,000 mcg by mouth daily in the afternoon.    carboxymethylcellulose 0.5 % Soln Commonly known as: REFRESH PLUS Place 1 drop into both eyes 3 (three) times daily as needed (dry eyes).   cyanocobalamin 1000 MCG tablet Commonly known as: VITAMIN B12 Take 1,000 mcg by mouth every evening.   ferric citrate  1 GM 210 MG(Fe) tablet Commonly known as: AURYXIA  Take 210 mg by mouth 3 (three) times daily with meals.   FISH OIL PO Take 2,400 mg by mouth in the morning and at bedtime.   fluticasone  50 MCG/ACT nasal spray Commonly known as: FLONASE  Place 1 spray into both nostrils daily as needed (Congestion).   furosemide  80 MG tablet Commonly known as: LASIX  Take 80 mg by mouth daily.   lidocaine  5 % Commonly known as: LIDODERM  Place 1 patch onto the skin daily. Remove & Discard patch within 12 hours or as directed by MD   lidocaine -prilocaine  cream Commonly known as: EMLA  Apply 1 application topically as needed (prior to fisula access). Tuesday, Thursday, Saturday for dialysis   Muscle Rub 10-15 % Crea Apply 1 Application topically 2 (two) times daily. Apply to left shoulder.   pantoprazole  40 MG tablet Commonly known as: PROTONIX  Take 1 tablet (40 mg total) by mouth 2 (two) times daily. What changed: when to take this   polyethylene glycol 17 g packet Commonly known as: MIRALAX  / GLYCOLAX  Take 17 g by mouth daily  as needed for moderate constipation.   PROBIOTIC ACIDOPHILUS PO Take 1 capsule by mouth in the morning.   Soliqua  100-33 UNT-MCG/ML Sopn Generic drug: Insulin  Glargine-Lixisenatide  Inject 6 Units into the skin in the morning. What changed: how much to take   Vitamin E 670 MG (1000 UT) Caps Take 1,000 Units by mouth in the morning.         Disposition and follow-up:   Ms.Jeena DARIAN ACE was discharged from [facility] in Stable condition.  At the hospital follow up visit please address:  1.  Follow-up:  a.  AoC Anemia 2/2 UGIB: ensure symptoms resolved, repeat CBC, ensure taking  PPI BID and GI f/u, holding ASA given hx of prior GIB    b. Post-arrest MSK pain: assess chest wall and shoulder pain, continue supportive care (tylenol , lidocaine  patch, voltaren  gel and bengay cream) if needed    c. T2DM: Reduced home long acting to 6 units day, assess glycemic control and adjust outpatient    d. Meds: Re-evaluate for restarting Coreg  at PCP f/u  2.  Labs / imaging needed at time of follow-up: CBC, renal function panel 3.  Pending labs/ test needing follow-up: none 4.  Medication Changes START:  -Soliqua  (Insulin  Glargine-Lixisenatide ) 6 units daily (REDUCED from previous dose)  -Protonix  (pantoprazole ) 40 mg twice a day  -Tessalon  perles 200 mg TID as needed for cough -Lidocaine  patch for muscle pain -Voltaren  gel apply 4 times a day to areas of pain -Muscle rub (Bengay) twice a day to shoulder   STOP: Aspirin  81 mg and Coreg  6.25 mg (until your primary care follow-up appt)  Follow-up Appointments:  Follow-up Information     [facility] Follow up.   Specialty: Home Health Services Why: Someone will call you to schedule resumption of care visit. Contact information: [address] [place] [address] [phone]         Abbe Hoard., MD. Schedule an appointment as soon as possible for a visit.   Specialty: Internal Medicine Contact information: [address] [place] [address] [phone]                 Hospital Course by problem list: Patient Summary:Alexa MARSA MATTEO is a 83 y.o. with a pertinent PMH of ESRD on HD TThS, T2DM, nonrheumatic mitral valve regurgitation, HFpEF, advanced PVD, anemia of chronic disease, and hx of duodenal/jejunal angioectasias in 2022 who presented with weakness and melenic stool and admitted for concern for UGI bleed.   Acute on Chronic Anemia from Acute Upper GI bleed (stable) Hx of duodenal/jejunal angioectasias Hx Diverticulosis (2022)  This was her presenting issue. Pt with a history  of AVMs. Started aspirin  for CAD recently. Admission 5/2 with Hg of 6.6 and received blood, but thereafter stable. EGD and capsule endoscopy without clear source of bleed. 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 During that procedure, pt experience bradycardic arrest and quick ROSC after brief CPR. Thereafter bleeding remained stable with no reports of blood per rectum and stable Hbg at discharge of 9.1. Received IV iron  infusion and to continue oral supplement. Held ASA given hx of recurrent GI bleeds, will f/u with her PCP.    Intraprocedure Cardiac Arrest during EGD with CPR Chest pain MSK from CPR Likely vagal with intra-procedural bradycardia and arrest. Short duration of CPR until ROSC. Spent two nights in ICU, was extubated 5/8 and transferred 5/10. Pt no longer candidate for  endoscopy. MSK pain improved with lidocaine  patch, tylenol , oxycodone  PRN. At risk for aspiration, monitored respiratory status, which remains stable, normal WBC and afebrile. Was declined for inpatient rehab, patient preferred to return home, resumption of [facility] services (PT/OT/RN/SW and aide).  L shoulder pain, musculoskeletal Improving. Etiology from moving herself in bed, possible influence from her recent CPR. L shoulder XR unrevealing. Supportive care with voltaren  gel, tylenol  and aquathermia. PT/OT following.   Chronic Stable Issues Chronic Hyponatremia As low as 120 early in course but improved with return to diet, potentially due to recently being NPO and ESRD. Baseline 127-130 range. No symptoms. - Ensure feeding supplement   Hyperkalemia, resolved ESRD on HD TThS Resume dialysis schedule. Stable. Briefly had hyperkalemia, EKG stable, received temporizing and Lokelma , normalized to 4.8. Received inpatient HD last on 5/17.    Insulin  dependent T2DM, controlled  Patient was hypoglycemic earlier in hospitalization and so maintenance insulin  stopped. Resumed at  lower dose of 6 units of long acting. Will d/c with reduced dose of 6 units daily of her Soliqua    HFpEF Stable, not in exacerbation. Last echo 12/2023 with 60-65 EF and normal function of the LV. Has moderate to severe MR. No aortic stenosis. Continue lasix  daily. Restart home Coreg  at PCP f/u. Receives HD for volume management.     Discharge Subjective: Denies any new concerns. No worsening cough, Tessalon  has helped. No fever or SOB. Shoulder pain has greatly improved. Still has chest wall pain but lidocaine  patch has helped. Plan for HD today and hopeful for discharge afterwards.    Discharge Exam:   BP 117/70   Pulse 80   Temp 98.7 F (37.1 C)   Resp 13   Ht 5\' 1"  (1.549 m)   Wt 76.1 kg   SpO2 100%   BMI 31.70 kg/m  General: Alert, sitting up in chair comfortably, no acute distress Cardio: Regular rate and rhythm Chest: Has chest wall tenderness with palpation Pulmonary: Clear to ausculation bilaterally, no wheezing or crackles, normal effort on room air   Neuro: Awake and alert  Skin: warm and dry    Pertinent Labs, Studies, and Procedures:     Latest Ref Rng & Units 03/10/2024    6:07 AM 03/09/2024    7:44 AM 03/08/2024    5:02 AM  CBC  WBC 4.0 - 10.5 K/uL 6.5  7.0    Hemoglobin 12.0 - 15.0 g/dL 9.1  9.1  9.0   Hematocrit 36.0 - 46.0 % 28.4  28.1  28.6   Platelets 150 - 400 K/uL 204  183         Latest Ref Rng & Units 03/11/2024    4:53 AM 03/10/2024    4:02 PM 03/10/2024   10:23 AM  CMP  Glucose 70 - 99 mg/dL 811  914    BUN 8 - 23 mg/dL 53  45    Creatinine 7.82 - 1.00 mg/dL 9.56  2.13    Sodium 086 - 145 mmol/L 125  126    Potassium 3.5 - 5.1 mmol/L 4.9  4.8  6.2   Chloride 98 - 111 mmol/L 86  87    CO2 22 - 32 mmol/L 24  25    Calcium  8.9 - 10.3 mg/dL 9.0  9.0      No results found.   Discharge Instructions: Discharge Instructions     Call MD for:  difficulty breathing, headache or visual disturbances   Complete by: As directed    Call MD for:  persistant nausea and vomiting   Complete by: As directed    Call MD for:  temperature >100.4   Complete by: As directed    Diet - low sodium heart healthy   Complete by: As directed    Discharge instructions   Complete by: As directed    You were hospitalized for GI bleed which has resolved. No further endoscopies due to cardiac arrest. Your blood counts (hemoglobin) remain stable. You received IV iron  and continued dialysis in hospital. Your chest wall and shoulder pain will continue to improve. Continue working with therapy at home. Thank you for allowing us  to be part of your care.    Schedule follow-up appointments: Abbe Hoard., MD (Call their office for hospital follow up in 1-2 weeks)   Please note these changes made to your medications:  *Please START taking:  -Soliqua  (Insulin  Glargine-Lixisenatide ) 6 units daily (REDUCED from previous dose)  -Protonix  (pantoprazole ) 40 mg twice a day  -Lidocaine  patch for muscle pain/chest wall pain -Voltaren  gel apply 4 times a day to areas of pain -Muscle rub (Bengay) twice a day to shoulder -Tylenol  [phone] every 8 hours as needed for pain    *Please STOP taking:  -Aspirin  81 mg (until your primary care follow-up appt) -Coreg  6.25 mg (until your primary care follow-up appt)   Please make sure to schedule hospital follow-up appointment with your primary doctor Eluterio Hamburg, Murvin Arthurs., MD).   Increase activity slowly   Complete by: As directed    No wound care   Complete by: As directed        Signed: Jearldine Mina, DO 03/11/2024, 6:19 PM

## 2024-03-11 NOTE — Progress Notes (Addendum)
 Received patient in bed to unit.  bBay 7 Alert and oriented. A&O x4 Informed consent signed and in chart. Yes  TX duration:3.5  Patient tolerated well. Yes Transported back to the room Via bed Alert, without acute distress.  Hand-off given to patient's nurse. RN  Access used: Left AVG Access issues: None  Total UF removed: 4000 Medication(s) given: Albumin  and Midodrine Post HD weight: 72.1 KG Post HD VS: 101/57, 67 MAP, 15 Resp, 100% @L  per N/C, 91 P   Jyoti Harju S Vineeth Fell RN, DNP [facility] D[facility]

## 2024-03-11 NOTE — Progress Notes (Signed)
 [facility] Progress Note   Subjective:   Pt reports cough and chest pain with coughing. Denies SOB. Notes her legs seem more swollen and feel itchy.   Objective Vitals:   03/10/24 1630 03/10/24 2023 03/11/24 0443 03/11/24 0833  BP: (!) 129/114 (!) 122/51 113/70 133/72  Pulse: (!) 103 95 88 98  Resp: 19 18 19    Temp: 98.2 F (36.8 C) 97.8 F (36.6 C) (!) 97.5 F (36.4 C)   TempSrc:      SpO2: 99% 96% 98% 100%  Weight:      Height:       Physical Exam General: Alert female in NAD Heart: RRR, no murmurs, rubs or gallops Lungs: + expiratory wheeze LLL. No rhonchi or rales auscultated Abdomen: Soft, non-distended Extremities: 1+ pitting edema b/l lower extremities Dialysis Access:  LUE AVG +T/b  Additional Objective Labs: Basic Metabolic Panel: Recent Labs  Lab 03/09/24 0538 03/10/24 0607 03/10/24 1023 03/10/24 1602 03/11/24 0453  NA 124* 129*  --  126* 125*  K 4.5 5.4* 6.2* 4.8 4.9  CL 88* 90*  --  87* 86*  CO2 26 27  --  25 24  GLUCOSE 144* 137*  --  268* 129*  BUN 52* 36*  --  45* 53*  CREATININE 6.03* 4.73*  --  5.40* 5.91*  CALCIUM  8.6* 8.8*  --  9.0 9.0  PHOS 3.8 3.0  --   --  3.4   Liver Function Tests: Recent Labs  Lab 03/09/24 0538 03/10/24 0607 03/11/24 0453  ALBUMIN  2.4* 2.4* 2.3*   No results for input(s): "LIPASE", "AMYLASE" in the last 168 hours. CBC: Recent Labs  Lab 03/05/24 0500 03/06/24 0426 03/07/24 0503 03/08/24 0502 03/09/24 0744 03/10/24 0607  WBC 7.3 6.2  --   --  7.0 6.5  HGB 9.4* 8.3*   < > 9.0* 9.1* 9.1*  HCT 28.7* 25.6*   < > 28.6* 28.1* 28.4*  MCV 102.5* 103.2*  --   --  100.0 101.4*  PLT 162 153  --   --  183 204   < > = values in this interval not displayed.   Blood Culture    Component Value Date/Time   SDES BLOOD RIGHT HAND 03/16/2021 0046   SPECREQUEST  03/16/2021 0046    BOTTLES DRAWN AEROBIC ONLY Blood Culture results may not be optimal due to an inadequate volume of blood received in culture  bottles   CULT  03/16/2021 0046    NO GROWTH 5 DAYS Performed at [facility], [address], [place], [place] [address]    REPTSTATUS 03/21/2021 FINAL 03/16/2021 0046    Cardiac Enzymes: No results for input(s): "CKTOTAL", "CKMB", "CKMBINDEX", "TROPONINI" in the last 168 hours. CBG: Recent Labs  Lab 03/10/24 1112 03/10/24 1450 03/10/24 1632 03/10/24 2247 03/11/24 0753  GLUCAP 200* 350* 257* 194* 132*   Iron  Studies: No results for input(s): "IRON ", "TIBC", "TRANSFERRIN", "FERRITIN" in the last 72 hours. @lablastinr3 @ Studies/Results: No results found. Medications:   acetaminophen   1,000 mg Oral Q8H   atorvastatin   10 mg Oral Daily   benzonatate   200 mg Oral TID   Chlorhexidine  Gluconate Cloth  6 each Topical Q0600   Chlorhexidine  Gluconate Cloth  6 each Topical Q0600   darbepoetin (ARANESP ) injection - DIALYSIS  100 mcg Subcutaneous Q Thu-1800   diclofenac  Sodium  4 g Topical QID   feeding supplement  237 mL Oral BID BM   ferric citrate   210 mg Oral TID WC  furosemide   80 mg Oral Daily   insulin  aspart  0-5 Units Subcutaneous QHS   insulin  aspart  0-6 Units Subcutaneous TID WC   insulin  glargine-yfgn  6 Units Subcutaneous Daily   lidocaine   1 patch Transdermal Q24H   Muscle Rub   Topical BID   pantoprazole   40 mg Oral BID   polyethylene glycol  17 g Oral Daily   senna-docusate  2 tablet Oral Daily    Dialysis Orders: [facility] 3.5h   B400    70.6kg    LUA AVG   Heparin  none Last OP HD 5/01, post wt 70.9 Comes off 0-2kg over, bp's wnl on HD Mircera 200mcg IV q 2 weeks- last dose 02/24/24  Assessment/Plan: Symptomatic anemia/GI bleed.  Hb 6.6 on admission. 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 GI recommends against future endoscopy d/t procedural bradycardia. Supportive management going forward. Serial H/H. Transfuse prn Hgb <7. Continue weekly aranesp  Cardiac arrest:  2/2 hypotension and bradycardia. S/p brief CPR with rib  fractures. Now extubated.  Hyponatremia: chronic mild hypoNa high 120s low 130s. 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  Next HD 5/17 HTN:  BP well controlled. Not on antihypertensive medications Volume: Does appear volume overloaded and Na trending down, see above. Attempting higher UF goal today if BP tolerates Secondary hyperparathyroidism: Cor Ca and phos in range, cont home binders ac tid.  Generalized weakness. Denies for CIR, plan is to d/c home with home health    Ramona Burner, PA-C 03/11/2024, 10:34 AM  [facility] Pager: [phone]

## 2024-03-11 NOTE — Progress Notes (Signed)
 HD#10 SUBJECTIVE:  Patient Summary:Anna Carr is a 83 y.o. with a pertinent PMH of ESRD on HD TThS, T2DM, nonrheumatic mitral valve regurgitation, HFpEF, advanced PVD, anemia of chronic disease, and hx of duodenal/jejunal angioectasias in 2022 who presented with weakness and melenic stool and admitted for concern for UGI bleed.    She experienced brief cardiac arrest during EGD on 03/02/24 and was subsequently intubated and transferred to ICU. Extubated later on 5/8 and IMTS assumed care on medical floor on 5/10.  Overnight Events and Interim History:  Denies any new concerns. No worsening cough. No fever or SOB. Shoulder pain has greatly improved. Plan for HD today and hopeful for discharge afterwards.    Significant Hospital Events:  5/8 Cardiac arrest during EGD on 5/8, intubated in ICU 5/8 Extubated 5/10 Returned to floor care  OBJECTIVE:  Vital Signs: Vitals:   03/10/24 1630 03/10/24 2023 03/11/24 0443 03/11/24 0833  BP: (!) 129/114 (!) 122/51 113/70 133/72  Pulse: (!) 103 95 88 98  Resp: 19 18 19    Temp: 98.2 F (36.8 C) 97.8 F (36.6 C) (!) 97.5 F (36.4 C)   TempSrc:      SpO2: 99% 96% 98% 100%  Weight:      Height:       Supplemental O2: Room Air SpO2: 100 % O2 Flow Rate (L/min): 2 L/min FiO2 (%): 50 %  Filed Weights   03/09/24 0500 03/09/24 0727 03/09/24 1118  Weight: 79.4 kg 76.1 kg 73.1 kg     Intake/Output Summary (Last 24 hours) at 03/11/2024 1234 Last data filed at 03/11/2024 4540 Gross per 24 hour  Intake 630 ml  Output 0 ml  Net 630 ml   Net IO Since Admission: -4,023.08 mL [03/11/24 1234]  Physical Exam: General: Alert, sitting up in chair comfortably, no acute distress Cardio: Regular rate and rhythm Chest: Has chest wall tenderness with palpation Pulmonary: Clear to ausculation bilaterally, no wheezing or crackles, normal effort on room air   Neuro: Awake and alert  Skin: warm and dry   Patient Lines/Drains/Airways Status      Active Line/Drains/Airways     Name Placement date Placement time Site Days   Peripheral IV 03/05/24 20 G 1.88" Right;Anterior Forearm 03/05/24  1106  Forearm  4   Fistula / Graft Left Upper arm Arteriovenous vein graft 03/12/21  1254  Upper arm  1093   Wound / Incision (Open or Dehisced) 02/26/24 Other (Comment) Knee Right;Left;Anterior bilateral knee abrasion 02/26/24  1900  Knee  12            ASSESSMENT/PLAN:  Assessment: Principal Problem:   Acute on chronic anemia Active Problems:   Type 2 diabetes mellitus with chronic kidney disease on chronic dialysis, with long-term current use of insulin  ([name])   ESRD on dialysis ([name])   Angiodysplasia of small intestine   Symptomatic anemia   Cardiac arrest [facility])   Musculoskeletal chest pain   AVM (arteriovenous malformation) of duodenum, acquired   Acute upper GI bleed   Acute pain of left shoulder  Anna Carr is a 83 y.o. with a pertinent PMH of ESRD on HD TThS, T2DM, nonrheumatic mitral valve regurgitation, HFpEF, advanced PVD, anemia of chronic disease, and hx of duodenal/jejunal angioectasias in 2022 who presented with weakness and melenic stool and admitted for concern for UGI bleed.   She experienced brief cardiac arrest during EGD on 03/02/24 and was subsequently intubated and transferred to ICU. Extubated later on 5/8 and IMTS  assumed care on medical floor on 5/10.  Plan: Acute on Chronic Anemia from Acute Upper GI bleed (stable) Hx of duodenal/jejunal angioectasias Hx Diverticulosis (2022)  This was her presenting issue. Hg stable at 9.1 and no evidence of bleeding. EGD on 5/8 with argon plasma coagulation of non bleeding AVMs in the duodenum. She chronically has black stools. - transfusion goal > 7 - Holding her home ASA given recurrent GI bleeds - PPI 40 mg BID - continue to work with PT/OT for strength and endurance - Medically stable, was denied CIR, prefers HH, plan for d/c today after HD   Hyperkalemia K up  to 6.2 yesterday. EKG stable. Received temporizing measures and Lokelma . K improved and stable at 4.8-4.9.   L shoulder pain, musculoskeletal Improving. Etiology from moving herself in bed, possible influence from her recent CPR. XR unrevealing.  - Voltaren  Gel & tylenol  & robaxin  for breakthrough, aquathermia, encouraged gentle exercise - PT/OT   Intra-procedural Cardiac Arrest during EGD with CPR Chest pain MSK from CPR Likely vagal with intra-procedural bradycardia and arrest. Short duration of CPR until ROSC. Spent two nights in ICU, was extubated 5/8 and transferred 5/10. Pt no longer candidate for endoscopy. - lidocaine  patch, tylenol , oxycodone  prn for chest wall pain - Dys diet diet per speech - At risk for aspiration, will monitor respiratory status, which continues to be stable on RA and clear lung exam    Stable Issues Chronic Hyponatremia As low as 120 early in course but improved with return to diet, potentially due to recently being NPO and ESRD. Baseline 127-130 range. No symptoms. - Ensure feeding supplement   ESRD on HD TThS Resume dialysis schedule. Monitor fluid balance and electrolytes.   T2DM Patient was hypoglycemic earlier in hospitalization and so maintenance insulin  stopped. Resumed at lower dose when am sugars trended above 200. -continue to monitor glucose, continue SSI -A1c below 6, on 12 semglee  PTA, restarted at 6 units daily and continue at d/c at reduced dose until PCP f/u   HFpEF Stable, not in exacerbation. Last echo 12/2023 with 60-65 EF and normal function of the LV. Has moderate to severe MR. No aortic stenosis.  - Continue lasix  and resume coreg  at PCP f/u  - Daily weights, I&Os. On HD.    Best Practice: Diet: Dys 3 IVF: n/a VTE: SCDs Start: 03/02/24 1129 Code: Full AB: n/a Pain Medicine: Tylenol , lidocaine  patch, aquathermia Bowel Regimen: Miralax  daily, senokot  Therapy Recs: CIR vs SNF vs HH DISPO: Anticipated discharge today to Home  pending HD session.    Signature: Jearldine Mina, DO Internal Medicine Resident PGY-2 Pager: # [phone]. 12:34 PM, 03/11/2024

## 2024-03-12 ENCOUNTER — Telehealth: Payer: Self-pay | Admitting: Physician Assistant

## 2024-03-12 NOTE — Discharge Planning (Signed)
 [facility] Patient Discharge Orders- [facility] CLINIC: [place]  Patient's name: Anna Carr Admit/DC Dates: 02/25/2024 - 03/11/2024  Discharge Diagnoses: Acute on chronic anemia   Intraprocedure cardiac arrest  Aranesp : Given: yes   Date and amount of last dose: 100mcg on 03/09/24  Last Hgb: 9.1 PRBC's Given: yes Date/# of units: 2 units on 02/25/24, 1 unit on 03/02/24 ESA dose for discharge: mircera 200 mcg IV q 2 weeks  IV Iron  dose at discharge: none  Heparin  change: no  EDW Change: no New EDW:   [place] Change: no  Access intervention/Change: no Details:  Hectorol/Calcitriol change: no  Discharge Labs: Calcium  9.0 Phosphorus 3.4 Albumin  2.3 K+ 4.9  IV Antibiotics: no Details:  On Coumadin?: no Last INR: Next INR: Managed By:   OTHER/APPTS/LAB ORDERS:    D/C Meds to be reconciled by nurse after every discharge.  Completed By: Ramona Burner, PA-C 03/12/2024, 12:54 PM  [facility] Pager: [phone]    Reviewed by: MD:______ RN_______

## 2024-03-12 NOTE — Telephone Encounter (Signed)
 Transition of care contact from inpatient facility  Date of Discharge: 03/11/24 Date of Contact: 03/12/24 Method of contact: Phone  Attempted to contact patient to discuss transition of care from inpatient admission. Patient did not answer the phone. Message was left on the patient's voicemail with call back instructions.  Ramona Burner, PA-C 03/12/2024, 1:12 PM  [employer] Pager: [phone]

## 2024-03-13 ENCOUNTER — Other Ambulatory Visit: Payer: Self-pay | Admitting: Student

## 2024-03-13 DIAGNOSIS — N186 End stage renal disease: Secondary | ICD-10-CM

## 2024-03-13 NOTE — Progress Notes (Signed)
 Late Note Entry- Mar 13, 2024  Pt was d/c on Saturday. Contacted [facility] this morning to be advised of pt's d/c date and that pt should resume care tomorrow.   Lauraine Polite Renal Navigator [phone]

## 2024-03-17 ENCOUNTER — Encounter (HOSPITAL_COMMUNITY): Payer: Self-pay | Admitting: Anesthesiology

## 2024-03-21 ENCOUNTER — Telehealth: Payer: Self-pay | Admitting: Cardiology

## 2024-03-21 NOTE — Telephone Encounter (Signed)
 Patients daughter came in saying that mothers spine doctor is wanting her to take ibuprofen but was told by Dr. Linnell Richardson that she shouldn't take that and is requesting a call back to check and see if this is okay for her to take since she has a pinched nerve in her back. CB # [phone]

## 2024-03-22 NOTE — Telephone Encounter (Signed)
 Called Anna Carr and informed her of Dr. Tonja Fray recommendation below:  "Should be fine to take ibuprofen"  Anna Carr verbalized understanding and had no further questions at this time.

## 2024-04-04 DIAGNOSIS — Q2730 Arteriovenous malformation, site unspecified: Secondary | ICD-10-CM | POA: Insufficient documentation

## 2024-04-04 DIAGNOSIS — J189 Pneumonia, unspecified organism: Secondary | ICD-10-CM | POA: Insufficient documentation

## 2024-04-04 DIAGNOSIS — Z9289 Personal history of other medical treatment: Secondary | ICD-10-CM | POA: Insufficient documentation

## 2024-04-05 ENCOUNTER — Ambulatory Visit: Admitting: Cardiology

## 2024-04-10 ENCOUNTER — Inpatient Hospital Stay (HOSPITAL_COMMUNITY)

## 2024-04-10 ENCOUNTER — Emergency Department (HOSPITAL_COMMUNITY)

## 2024-04-10 ENCOUNTER — Inpatient Hospital Stay (HOSPITAL_COMMUNITY)
Admission: EM | Admit: 2024-04-10 | Discharge: 2024-04-14 | DRG: 853 | Disposition: A | Discharge status: Home Health | Attending: Infectious Diseases | Admitting: Infectious Diseases

## 2024-04-10 ENCOUNTER — Encounter (HOSPITAL_COMMUNITY): Payer: Self-pay | Admitting: Pulmonary Disease

## 2024-04-10 ENCOUNTER — Other Ambulatory Visit: Payer: Self-pay

## 2024-04-10 DIAGNOSIS — I959 Hypotension, unspecified: Secondary | ICD-10-CM

## 2024-04-10 DIAGNOSIS — Y832 Surgical operation with anastomosis, bypass or graft as the cause of abnormal reaction of the patient, or of later complication, without mention of misadventure at the time of the procedure: Secondary | ICD-10-CM | POA: Diagnosis not present

## 2024-04-10 DIAGNOSIS — J189 Pneumonia, unspecified organism: Secondary | ICD-10-CM

## 2024-04-10 DIAGNOSIS — D500 Iron deficiency anemia secondary to blood loss (chronic): Secondary | ICD-10-CM | POA: Diagnosis present

## 2024-04-10 DIAGNOSIS — J90 Pleural effusion, not elsewhere classified: Secondary | ICD-10-CM | POA: Diagnosis present

## 2024-04-10 DIAGNOSIS — I340 Nonrheumatic mitral (valve) insufficiency: Secondary | ICD-10-CM | POA: Diagnosis present

## 2024-04-10 DIAGNOSIS — E1151 Type 2 diabetes mellitus with diabetic peripheral angiopathy without gangrene: Secondary | ICD-10-CM | POA: Diagnosis present

## 2024-04-10 DIAGNOSIS — R627 Adult failure to thrive: Secondary | ICD-10-CM | POA: Diagnosis present

## 2024-04-10 DIAGNOSIS — I5030 Unspecified diastolic (congestive) heart failure: Secondary | ICD-10-CM | POA: Diagnosis present

## 2024-04-10 DIAGNOSIS — I10 Essential (primary) hypertension: Secondary | ICD-10-CM | POA: Diagnosis present

## 2024-04-10 DIAGNOSIS — D631 Anemia in chronic kidney disease: Secondary | ICD-10-CM

## 2024-04-10 DIAGNOSIS — E119 Type 2 diabetes mellitus without complications: Secondary | ICD-10-CM

## 2024-04-10 DIAGNOSIS — Z860100 Personal history of colon polyps, unspecified: Secondary | ICD-10-CM

## 2024-04-10 DIAGNOSIS — E440 Moderate protein-calorie malnutrition: Secondary | ICD-10-CM | POA: Diagnosis present

## 2024-04-10 DIAGNOSIS — L03115 Cellulitis of right lower limb: Secondary | ICD-10-CM | POA: Diagnosis not present

## 2024-04-10 DIAGNOSIS — Z8674 Personal history of sudden cardiac arrest: Secondary | ICD-10-CM

## 2024-04-10 DIAGNOSIS — E1122 Type 2 diabetes mellitus with diabetic chronic kidney disease: Secondary | ICD-10-CM | POA: Diagnosis present

## 2024-04-10 DIAGNOSIS — K922 Gastrointestinal hemorrhage, unspecified: Secondary | ICD-10-CM | POA: Diagnosis not present

## 2024-04-10 DIAGNOSIS — J181 Lobar pneumonia, unspecified organism: Secondary | ICD-10-CM | POA: Diagnosis present

## 2024-04-10 DIAGNOSIS — K7682 Hepatic encephalopathy: Secondary | ICD-10-CM | POA: Diagnosis present

## 2024-04-10 DIAGNOSIS — A419 Sepsis, unspecified organism: Principal | ICD-10-CM

## 2024-04-10 DIAGNOSIS — T82818A Embolism of vascular prosthetic devices, implants and grafts, initial encounter: Secondary | ICD-10-CM | POA: Diagnosis not present

## 2024-04-10 DIAGNOSIS — R6521 Severe sepsis with septic shock: Secondary | ICD-10-CM | POA: Diagnosis present

## 2024-04-10 DIAGNOSIS — I272 Pulmonary hypertension, unspecified: Secondary | ICD-10-CM | POA: Diagnosis present

## 2024-04-10 DIAGNOSIS — Z79899 Other long term (current) drug therapy: Secondary | ICD-10-CM

## 2024-04-10 DIAGNOSIS — Q2730 Arteriovenous malformation, site unspecified: Secondary | ICD-10-CM

## 2024-04-10 DIAGNOSIS — E785 Hyperlipidemia, unspecified: Secondary | ICD-10-CM | POA: Diagnosis present

## 2024-04-10 DIAGNOSIS — K31811 Angiodysplasia of stomach and duodenum with bleeding: Secondary | ICD-10-CM | POA: Diagnosis present

## 2024-04-10 DIAGNOSIS — I132 Hypertensive heart and chronic kidney disease with heart failure and with stage 5 chronic kidney disease, or end stage renal disease: Secondary | ICD-10-CM | POA: Diagnosis present

## 2024-04-10 DIAGNOSIS — R579 Shock, unspecified: Secondary | ICD-10-CM | POA: Diagnosis present

## 2024-04-10 DIAGNOSIS — M898X9 Other specified disorders of bone, unspecified site: Secondary | ICD-10-CM | POA: Diagnosis present

## 2024-04-10 DIAGNOSIS — I5032 Chronic diastolic (congestive) heart failure: Secondary | ICD-10-CM | POA: Diagnosis present

## 2024-04-10 DIAGNOSIS — Z6830 Body mass index (BMI) 30.0-30.9, adult: Secondary | ICD-10-CM

## 2024-04-10 DIAGNOSIS — N2581 Secondary hyperparathyroidism of renal origin: Secondary | ICD-10-CM | POA: Diagnosis present

## 2024-04-10 DIAGNOSIS — K921 Melena: Secondary | ICD-10-CM

## 2024-04-10 DIAGNOSIS — Z7982 Long term (current) use of aspirin: Secondary | ICD-10-CM

## 2024-04-10 DIAGNOSIS — E872 Acidosis, unspecified: Secondary | ICD-10-CM | POA: Diagnosis present

## 2024-04-10 DIAGNOSIS — Z833 Family history of diabetes mellitus: Secondary | ICD-10-CM

## 2024-04-10 DIAGNOSIS — S2241XA Multiple fractures of ribs, right side, initial encounter for closed fracture: Secondary | ICD-10-CM | POA: Diagnosis present

## 2024-04-10 DIAGNOSIS — Z992 Dependence on renal dialysis: Secondary | ICD-10-CM | POA: Diagnosis not present

## 2024-04-10 DIAGNOSIS — E870 Hyperosmolality and hypernatremia: Secondary | ICD-10-CM | POA: Diagnosis present

## 2024-04-10 DIAGNOSIS — N186 End stage renal disease: Secondary | ICD-10-CM | POA: Diagnosis present

## 2024-04-10 DIAGNOSIS — R21 Rash and other nonspecific skin eruption: Secondary | ICD-10-CM | POA: Diagnosis not present

## 2024-04-10 DIAGNOSIS — E871 Hypo-osmolality and hyponatremia: Secondary | ICD-10-CM | POA: Diagnosis present

## 2024-04-10 DIAGNOSIS — J9601 Acute respiratory failure with hypoxia: Secondary | ICD-10-CM | POA: Diagnosis present

## 2024-04-10 DIAGNOSIS — L8990 Pressure ulcer of unspecified site, unspecified stage: Secondary | ICD-10-CM | POA: Insufficient documentation

## 2024-04-10 DIAGNOSIS — E8809 Other disorders of plasma-protein metabolism, not elsewhere classified: Secondary | ICD-10-CM | POA: Diagnosis present

## 2024-04-10 DIAGNOSIS — I2510 Atherosclerotic heart disease of native coronary artery without angina pectoris: Secondary | ICD-10-CM | POA: Diagnosis present

## 2024-04-10 DIAGNOSIS — Y92009 Unspecified place in unspecified non-institutional (private) residence as the place of occurrence of the external cause: Secondary | ICD-10-CM | POA: Diagnosis not present

## 2024-04-10 DIAGNOSIS — Z8249 Family history of ischemic heart disease and other diseases of the circulatory system: Secondary | ICD-10-CM

## 2024-04-10 DIAGNOSIS — Z604 Social exclusion and rejection: Secondary | ICD-10-CM | POA: Diagnosis present

## 2024-04-10 DIAGNOSIS — R578 Other shock: Secondary | ICD-10-CM

## 2024-04-10 DIAGNOSIS — Z794 Long term (current) use of insulin: Secondary | ICD-10-CM

## 2024-04-10 DIAGNOSIS — E778 Other disorders of glycoprotein metabolism: Secondary | ICD-10-CM | POA: Diagnosis present

## 2024-04-10 DIAGNOSIS — D649 Anemia, unspecified: Secondary | ICD-10-CM | POA: Diagnosis not present

## 2024-04-10 DIAGNOSIS — W19XXXA Unspecified fall, initial encounter: Secondary | ICD-10-CM | POA: Diagnosis present

## 2024-04-10 DIAGNOSIS — J440 Chronic obstructive pulmonary disease with acute lower respiratory infection: Secondary | ICD-10-CM | POA: Diagnosis present

## 2024-04-10 DIAGNOSIS — N189 Chronic kidney disease, unspecified: Secondary | ICD-10-CM | POA: Diagnosis present

## 2024-04-10 DIAGNOSIS — L97519 Non-pressure chronic ulcer of other part of right foot with unspecified severity: Secondary | ICD-10-CM | POA: Diagnosis present

## 2024-04-10 DIAGNOSIS — Z8616 Personal history of COVID-19: Secondary | ICD-10-CM

## 2024-04-10 DIAGNOSIS — G4700 Insomnia, unspecified: Secondary | ICD-10-CM | POA: Diagnosis present

## 2024-04-10 DIAGNOSIS — Z888 Allergy status to other drugs, medicaments and biological substances status: Secondary | ICD-10-CM

## 2024-04-10 DIAGNOSIS — H538 Other visual disturbances: Secondary | ICD-10-CM | POA: Diagnosis present

## 2024-04-10 DIAGNOSIS — Z87891 Personal history of nicotine dependence: Secondary | ICD-10-CM

## 2024-04-10 DIAGNOSIS — E11621 Type 2 diabetes mellitus with foot ulcer: Secondary | ICD-10-CM | POA: Diagnosis present

## 2024-04-10 LAB — CBC WITH DIFFERENTIAL/PLATELET
Abs Immature Granulocytes: 0.2 10*3/uL — ABNORMAL HIGH (ref 0.00–0.07)
Band Neutrophils: 5 %
Basophils Absolute: 0 10*3/uL (ref 0.0–0.1)
Basophils Relative: 0 %
Eosinophils Absolute: 0 10*3/uL (ref 0.0–0.5)
Eosinophils Relative: 0 %
HCT: 28.1 % — ABNORMAL LOW (ref 36.0–46.0)
Hemoglobin: 8.7 g/dL — ABNORMAL LOW (ref 12.0–15.0)
Lymphocytes Relative: 7 %
Lymphs Abs: 0.6 10*3/uL — ABNORMAL LOW (ref 0.7–4.0)
MCH: 31.9 pg (ref 26.0–34.0)
MCHC: 31 g/dL (ref 30.0–36.0)
MCV: 102.9 fL — ABNORMAL HIGH (ref 80.0–100.0)
Metamyelocytes Relative: 2 %
Monocytes Absolute: 0.1 10*3/uL (ref 0.1–1.0)
Monocytes Relative: 1 %
Neutro Abs: 8.1 10*3/uL — ABNORMAL HIGH (ref 1.7–7.7)
Neutrophils Relative %: 85 %
Platelets: 149 10*3/uL — ABNORMAL LOW (ref 150–400)
RBC: 2.73 MIL/uL — ABNORMAL LOW (ref 3.87–5.11)
RDW: 18.6 % — ABNORMAL HIGH (ref 11.5–15.5)
Smear Review: NORMAL
WBC: 9 10*3/uL (ref 4.0–10.5)
nRBC: 0 % (ref 0.0–0.2)

## 2024-04-10 LAB — ECHOCARDIOGRAM LIMITED
AR max vel: 1.03 cm2
AV Area VTI: 0.96 cm2
AV Area mean vel: 1.06 cm2
AV Mean grad: 4 mmHg
AV Peak grad: 7.1 mmHg
Ao pk vel: 1.34 m/s
Area-P 1/2: 3.37 cm2
Height: 61 in
MV VTI: 0.77 cm2
S' Lateral: 3.3 cm
Weight: 2596.14 [oz_av]

## 2024-04-10 LAB — GLUCOSE, CAPILLARY: Glucose-Capillary: 106 mg/dL — ABNORMAL HIGH (ref 70–99)

## 2024-04-10 LAB — RESP PANEL BY RT-PCR (RSV, FLU A&B, COVID)  RVPGX2
Influenza A by PCR: NEGATIVE
Influenza B by PCR: NEGATIVE
Resp Syncytial Virus by PCR: NEGATIVE
SARS Coronavirus 2 by RT PCR: NEGATIVE

## 2024-04-10 LAB — URINALYSIS, W/ REFLEX TO CULTURE (INFECTION SUSPECTED)
Glucose, UA: NEGATIVE mg/dL
Ketones, ur: 15 mg/dL — AB
Leukocytes,Ua: NEGATIVE
Nitrite: NEGATIVE
Protein, ur: 100 mg/dL — AB
Specific Gravity, Urine: 1.025 (ref 1.005–1.030)
pH: 5.5 (ref 5.0–8.0)

## 2024-04-10 LAB — I-STAT CHEM 8, ED
BUN: 52 mg/dL — ABNORMAL HIGH (ref 8–23)
Calcium, Ion: 1.07 mmol/L — ABNORMAL LOW (ref 1.15–1.40)
Chloride: 94 mmol/L — ABNORMAL LOW (ref 98–111)
Creatinine, Ser: 6.3 mg/dL — ABNORMAL HIGH (ref 0.44–1.00)
Glucose, Bld: 107 mg/dL — ABNORMAL HIGH (ref 70–99)
HCT: 27 % — ABNORMAL LOW (ref 36.0–46.0)
Hemoglobin: 9.2 g/dL — ABNORMAL LOW (ref 12.0–15.0)
Potassium: 5 mmol/L (ref 3.5–5.1)
Sodium: 131 mmol/L — ABNORMAL LOW (ref 135–145)
TCO2: 26 mmol/L (ref 22–32)

## 2024-04-10 LAB — COMPREHENSIVE METABOLIC PANEL WITH GFR
ALT: 22 U/L (ref 0–44)
AST: 26 U/L (ref 15–41)
Albumin: 2.6 g/dL — ABNORMAL LOW (ref 3.5–5.0)
Alkaline Phosphatase: 126 U/L (ref 38–126)
Anion gap: 17 — ABNORMAL HIGH (ref 5–15)
BUN: 56 mg/dL — ABNORMAL HIGH (ref 8–23)
CO2: 24 mmol/L (ref 22–32)
Calcium: 9.2 mg/dL (ref 8.9–10.3)
Chloride: 93 mmol/L — ABNORMAL LOW (ref 98–111)
Creatinine, Ser: 5.51 mg/dL — ABNORMAL HIGH (ref 0.44–1.00)
GFR, Estimated: 7 mL/min — ABNORMAL LOW (ref >60)
Glucose, Bld: 105 mg/dL — ABNORMAL HIGH (ref 70–99)
Potassium: 5.1 mmol/L (ref 3.5–5.1)
Sodium: 134 mmol/L — ABNORMAL LOW (ref 135–145)
Total Bilirubin: 1.3 mg/dL — ABNORMAL HIGH (ref 0.0–1.2)
Total Protein: 5.8 g/dL — ABNORMAL LOW (ref 6.5–8.1)

## 2024-04-10 LAB — POC OCCULT BLOOD, ED: Fecal Occult Bld: POSITIVE — AB

## 2024-04-10 LAB — HEMOGLOBIN AND HEMATOCRIT, BLOOD
HCT: 28.9 % — ABNORMAL LOW (ref 36.0–46.0)
HCT: 36.5 % (ref 36.0–46.0)
Hemoglobin: 9.1 g/dL — ABNORMAL LOW (ref 12.0–15.0)
Hemoglobin: 11.2 g/dL — ABNORMAL LOW (ref 12.0–15.0)

## 2024-04-10 LAB — IRON AND TIBC
Iron: 28 ug/dL (ref 28–170)
Saturation Ratios: 13 % (ref 10.4–31.8)
TIBC: 220 ug/dL — ABNORMAL LOW (ref 250–450)
UIBC: 192 ug/dL

## 2024-04-10 LAB — MRSA NEXT GEN BY PCR, NASAL: MRSA by PCR Next Gen: DETECTED — AB

## 2024-04-10 LAB — PREPARE RBC (CROSSMATCH)

## 2024-04-10 LAB — FERRITIN: Ferritin: 1300 ng/mL — ABNORMAL HIGH (ref 11–307)

## 2024-04-10 LAB — PROTIME-INR
INR: 1.3 — ABNORMAL HIGH (ref 0.8–1.2)
Prothrombin Time: 15.9 s — ABNORMAL HIGH (ref 11.4–15.2)

## 2024-04-10 LAB — I-STAT CG4 LACTIC ACID, ED: Lactic Acid, Venous: 3.2 mmol/L (ref 0.5–1.9)

## 2024-04-10 LAB — PROCALCITONIN: Procalcitonin: 10.24 ng/mL

## 2024-04-10 LAB — LACTIC ACID, PLASMA
Lactic Acid, Venous: 3.7 mmol/L (ref 0.5–1.9)
Lactic Acid, Venous: 6.3 mmol/L (ref 0.5–1.9)

## 2024-04-10 MED ORDER — SODIUM CHLORIDE 0.9% IV SOLUTION: Freq: Once | INTRAVENOUS | Status: AC

## 2024-04-10 MED ORDER — NAPHAZOLINE-PHENIRAMINE 0.025-0.3 % OP SOLN
1.0000 [drp] | Freq: Two times a day (BID) | OPHTHALMIC | Status: DC
  Administered 2024-04-11 – 2024-04-14 (×5): 1 [drp] via OPHTHALMIC
  Filled 2024-04-10 (×2): qty 15

## 2024-04-10 MED ORDER — SENNA 8.6 MG PO TABS
1.0000 | ORAL_TABLET | Freq: Every day | ORAL | Status: DC | PRN
  Administered 2024-04-14: 8.6 mg via ORAL
  Filled 2024-04-10: qty 1

## 2024-04-10 MED ORDER — INSULIN ASPART 100 UNIT/ML IJ SOLN
0.0000 [IU] | Freq: Three times a day (TID) | INTRAMUSCULAR | Status: DC
  Administered 2024-04-11 (×2): 2 [IU] via SUBCUTANEOUS
  Administered 2024-04-12 – 2024-04-14 (×3): 1 [IU] via SUBCUTANEOUS

## 2024-04-10 MED ORDER — VANCOMYCIN HCL 1500 MG/300ML IV SOLN
1500.0000 mg | Freq: Once | INTRAVENOUS | Status: AC
  Filled 2024-04-10 (×2): qty 300

## 2024-04-10 MED ORDER — MUPIROCIN 2 % EX OINT
1.0000 | TOPICAL_OINTMENT | Freq: Two times a day (BID) | CUTANEOUS | Status: DC
  Administered 2024-04-11 – 2024-04-14 (×5): 1 via NASAL
  Filled 2024-04-10 (×2): qty 22

## 2024-04-10 MED ORDER — ATORVASTATIN CALCIUM 10 MG PO TABS
10.0000 mg | ORAL_TABLET | Freq: Every day | ORAL | Status: DC
  Administered 2024-04-11 – 2024-04-12 (×2): 10 mg via ORAL
  Filled 2024-04-10 (×5): qty 1

## 2024-04-10 MED ORDER — ACETAMINOPHEN 325 MG PO TABS: 650.0000 mg | ORAL_TABLET | Freq: Four times a day (QID) | ORAL | Status: DC | PRN

## 2024-04-10 MED ORDER — CHLORHEXIDINE GLUCONATE CLOTH 2 % EX PADS
6.0000 | MEDICATED_PAD | Freq: Every day | CUTANEOUS | Status: AC
  Administered 2024-04-11 – 2024-04-14 (×3): 6 via TOPICAL

## 2024-04-10 MED ORDER — ALBUTEROL SULFATE (2.5 MG/3ML) 0.083% IN NEBU
2.5000 mg | INHALATION_SOLUTION | RESPIRATORY_TRACT | Status: DC | PRN
  Administered 2024-04-11: 2.5 mg via RESPIRATORY_TRACT
  Filled 2024-04-10 (×2): qty 3

## 2024-04-10 MED ORDER — SODIUM CHLORIDE 0.9 % IV SOLN
1.0000 g | INTRAVENOUS | Status: DC
  Administered 2024-04-11: 1 g via INTRAVENOUS
  Filled 2024-04-10 (×4): qty 10

## 2024-04-10 MED ORDER — ONDANSETRON HCL 4 MG/2ML IJ SOLN
4.0000 mg | Freq: Four times a day (QID) | INTRAMUSCULAR | Status: DC | PRN
  Filled 2024-04-10: qty 2

## 2024-04-10 MED ORDER — SODIUM CHLORIDE 0.9 % IV SOLN
2.0000 g | INTRAVENOUS | Status: DC
  Filled 2024-04-10: qty 20

## 2024-04-10 MED ORDER — ORAL CARE MOUTH RINSE: 15.0000 mL | OROMUCOSAL | Status: DC | PRN

## 2024-04-10 MED ORDER — NOREPINEPHRINE 4 MG/250ML-% IV SOLN
0.0000 ug/min | INTRAVENOUS | Status: DC
  Administered 2024-04-11: 6 ug/min via INTRAVENOUS
  Filled 2024-04-10 (×2): qty 250

## 2024-04-10 MED ORDER — SODIUM CHLORIDE 0.9 % IV BOLUS: 500.0000 mL | Freq: Once | INTRAVENOUS | Status: DC

## 2024-04-10 MED ORDER — LACTATED RINGERS IV BOLUS: 1000.0000 mL | Freq: Once | INTRAVENOUS | Status: AC

## 2024-04-10 MED ORDER — PANTOPRAZOLE SODIUM 40 MG IV SOLR
40.0000 mg | Freq: Two times a day (BID) | INTRAVENOUS | Status: DC
  Administered 2024-04-11 (×2): 40 mg via INTRAVENOUS
  Filled 2024-04-10 (×3): qty 10

## 2024-04-10 MED ORDER — ACETAMINOPHEN 650 MG RE SUPP: 650.0000 mg | Freq: Four times a day (QID) | RECTAL | Status: DC | PRN

## 2024-04-10 MED ORDER — VANCOMYCIN VARIABLE DOSE PER UNSTABLE RENAL FUNCTION (PHARMACIST DOSING): Status: DC

## 2024-04-10 MED ORDER — PANTOPRAZOLE SODIUM 40 MG IV SOLR
40.0000 mg | Freq: Once | INTRAVENOUS | Status: AC
Start: 2024-04-10 — End: 2024-04-10
  Filled 2024-04-10: qty 10

## 2024-04-10 MED ORDER — ONDANSETRON HCL 4 MG/2ML IJ SOLN: 4.0000 mg | Freq: Once | INTRAMUSCULAR | Status: DC

## 2024-04-10 MED ORDER — COSYNTROPIN 0.25 MG IJ SOLR
0.2500 mg | Freq: Once | INTRAMUSCULAR | Status: DC
  Filled 2024-04-10: qty 0.25

## 2024-04-10 NOTE — ED Triage Notes (Addendum)
 Pt BIB [facility] EMS from home due possible GI bleed.  Pt was here a month ago for the same.  Pt has been incontinent with dark coffee ground stools.  Pt weak and somewhat confused.  Pt does get dialysis on left arm.  T, T, Sat days. VS BP 82/48, HR 110, SpO2 83% RA 4L 98%. 18g right AC.

## 2024-04-10 NOTE — Consult Note (Signed)
 WOC Nurse Consult Note: Reason for Consult: Consult requested for right leg wounds. Performed remotely after review of progress notes and photos in the EMR.  Right leg with red moist scattered areas of full thickness skin loss with mod amt tan drainage.  Dressing procedure/placement/frequency: Topical treatment orders provided for bedside nurses to perform as follows to absorb drainage and provide antimicrobial benefits and promote healing: Apply a piece of Aquacel to right leg wounds Q day Timm Foot # [phone]), then cover with foam dressing. Change foam dressing daily or PRN soiling  Thank-you,  Wiliam Harder MSN, RN, [place], [place], [place] [phone]

## 2024-04-10 NOTE — Progress Notes (Signed)
 ED Pharmacy Antibiotic Sign Off An antibiotic consult was received from an ED provider for vancomycin per pharmacy dosing for sepsis. A chart review was completed to assess appropriateness.   The following one time order(s) were placed:  Vancomycin 1500mg  IV x 1   Further antibiotic and/or antibiotic pharmacy consults should be ordered by the admitting provider if indicated.   Thank you for allowing pharmacy to be a part of this patient's care.   Harvest Lineman, [name]  Clinical Pharmacist 04/10/24 2:50 PM

## 2024-04-10 NOTE — Plan of Care (Signed)

## 2024-04-10 NOTE — ED Notes (Signed)
 Phlebotomy to collect blood cultures and lactic acid.

## 2024-04-10 NOTE — Progress Notes (Signed)
 VAST consult received to place a 2nd large bore IV d/t GI bleed. Patient is a dialysis patient with a AVF in left arm, making it restricted. Right arm assessed utilizing ultrasound; vessels too small for large bore IV placement. Able to place a 22G 1.75 inch catheter in right forearm. Unit RN notified. If further access needed, recommend central line be placed due to poor vasculature.

## 2024-04-10 NOTE — ED Provider Notes (Addendum)
 [place] EMERGENCY DEPARTMENT AT [facility] Provider Note   CSN: [id number] Arrival date & time: 04/10/24  1021     Patient presents with: No chief complaint on file.   Anna Carr is a 83 y.o. female.   HPI Patient brought in by us  for GI bleed.  Reportedly was hypoxic.  Reportedly has had dark stools.  Patient states she has had dark stools for a long time.  Found to be hypotensive for EMS and hypotensive upon arrival.  Also had some hypoxia.  Patient has bruising to right face.  Reportedly had fall relatively recently but did have CT scan to rule out severe injury.    Prior to Admission medications   Medication Sig Start Date End Date Taking? Authorizing Provider  acetaminophen  (TYLENOL ) 500 MG tablet Take 500 mg by mouth every 6 (six) hours as needed for mild pain (pain score 1-3) or moderate pain (pain score 4-6).   Yes [provider]  ascorbic acid (VITAMIN C) 1000 MG tablet Take 1,000 mg by mouth at bedtime. 12/07/19  Yes [provider]  aspirin  EC 81 MG tablet Take 1 tablet (81 mg total) by mouth daily. Swallow whole. 12/01/23  Yes Krasowski, Robert J, MD  Biotin 5000 MCG TABS Take 5,000 mcg by mouth daily in the afternoon.   Yes [provider]  carboxymethylcellulose (REFRESH PLUS) 0.5 % SOLN Place 1 drop into both eyes 3 (three) times daily as needed (dry eyes).   Yes [provider]  carvedilol  (COREG ) 6.25 MG tablet Take 6.25 mg by mouth daily. 12/07/19  Yes [provider]  cyanocobalamin (VITAMIN B12) 1000 MCG tablet Take 1,000 mcg by mouth every evening.   Yes [provider]  ferric citrate  (AURYXIA ) 1 GM 210 MG(Fe) tablet Take 210 mg by mouth 3 (three) times daily with meals.   Yes [provider]  fluticasone  (FLONASE ) 50 MCG/ACT nasal spray Place 1 spray into both nostrils daily as needed (Congestion).   Yes [provider]  furosemide  (LASIX ) 80 MG tablet Take 80 mg by mouth  daily in the afternoon. 04/03/21  Yes [provider]  ibuprofen (ADVIL) 200 MG tablet Take 200-400 mg by mouth every 6 (six) hours as needed for mild pain (pain score 1-3).   Yes [provider]  Insulin  Glargine-Lixisenatide  (SOLIQUA ) 100-33 UNT-MCG/ML SOPN Inject 6 Units into the skin in the morning. 03/11/24  Yes Zheng, Michael, DO  Lactobacillus (PROBIOTIC ACIDOPHILUS PO) Take 1 capsule by mouth in the morning.   Yes [provider]  lidocaine -prilocaine  (EMLA ) cream Apply 1 application topically as needed (prior to fisula access). Tuesday, Thursday, Saturday for dialysis 07/04/20  Yes [provider]  meloxicam (MOBIC) 7.5 MG tablet Take 7.5-15 mg by mouth daily as needed for pain. 04/05/24  Yes [provider]  Menthol -Methyl Salicylate  (MUSCLE RUB) 10-15 % CREA Apply 1 Application topically 2 (two) times daily. Apply to left shoulder. Patient taking differently: Apply 1 Application topically 2 (two) times daily as needed for muscle pain. Apply to left shoulder. 03/11/24  Yes Jearldine Mina, DO  Omega-3 Fatty Acids (FISH OIL PO) Take 1 capsule by mouth in the morning and at bedtime.   Yes [provider]  pantoprazole  (PROTONIX ) 40 MG tablet Take 1 tablet (40 mg total) by mouth 2 (two) times daily. 03/11/24  Yes Zheng, Michael, DO  polyethylene glycol (MIRALAX  / GLYCOLAX ) 17 g packet Take 17 g by mouth 2 (two) times daily as needed  for moderate constipation.   Yes [provider]  Vitamin E 670 MG (1000 UT) CAPS Take 1,000 Units by mouth in the morning. 01/09/20  Yes [provider]  atorvastatin  (LIPITOR) 10 MG tablet Take 1 tablet (10 mg total) by mouth daily. Patient not taking: Reported on 04/10/2024 12/01/23   Krasowski, Robert J, MD  benzonatate  (TESSALON ) 200 MG capsule Take 1 capsule (200 mg total) by mouth 3 (three) times daily as needed for cough. Patient not taking: Reported on 04/10/2024 03/11/24   Zheng, Michael, DO   cephALEXin (KEFLEX) 500 MG capsule Take 500 mg by mouth 2 (two) times daily. Patient not taking: Reported on 04/10/2024 04/07/24   [provider]  lidocaine  (LIDODERM ) 5 % Place 1 patch onto the skin daily. Remove & Discard patch within 12 hours or as directed by MD Patient not taking: Reported on 04/10/2024 03/11/24   Zheng, Michael, DO    Allergies: Atorvastatin     Review of Systems  Updated Vital Signs BP (!) 94/39   Pulse 84   Temp 98.3 F (36.8 C) (Oral)   Resp (!) 24   SpO2 99%   Physical Exam Vitals reviewed.  Constitutional:      Comments: Somewhat sleepy, however arouses with stimulation.  HENT:     Head:     Comments: Ecchymosis to right face and down to his neck.  Mild discharge from eye on the right.  Does have slight conjunctival hemorrhage on the right eye.  Eyes:     Extraocular Movements: Extraocular movements intact.    Cardiovascular:     Rate and Rhythm: Normal rate.  Pulmonary:     Breath sounds: No wheezing or rhonchi.  Abdominal:     Tenderness: There is no abdominal tenderness.   Skin:    Capillary Refill: Capillary refill takes less than 2 seconds.   Neurological:     Comments: Awake and answers questions.  Moves extremities.    (all labs ordered are listed, but only abnormal results are displayed) Labs Reviewed  CBC WITH DIFFERENTIAL/PLATELET - Abnormal; Notable for the following components:      Result Value   RBC 2.73 (*)    Hemoglobin 8.7 (*)    HCT 28.1 (*)    MCV 102.9 (*)    RDW 18.6 (*)    Platelets 149 (*)    Neutro Abs 8.1 (*)    Lymphs Abs 0.6 (*)    Abs Immature Granulocytes 0.20 (*)    All other components within normal limits  COMPREHENSIVE METABOLIC PANEL WITH GFR - Abnormal; Notable for the following components:   Sodium 134 (*)    Chloride 93 (*)    Glucose, Bld 105 (*)    BUN 56 (*)    Creatinine, Ser 5.51 (*)    Total Protein 5.8 (*)    Albumin  2.6 (*)    Total Bilirubin 1.3 (*)    GFR, Estimated 7  (*)    Anion gap 17 (*)    All other components within normal limits  PROTIME-INR - Abnormal; Notable for the following components:   Prothrombin Time 15.9 (*)    INR 1.3 (*)    All other components within normal limits  POC OCCULT BLOOD, ED - Abnormal; Notable for the following components:   Fecal Occult Bld POSITIVE (*)    All other components within normal limits  I-STAT CHEM 8, ED - Abnormal; Notable for the following components:   Sodium 131 (*)    Chloride  94 (*)    BUN 52 (*)    Creatinine, Ser 6.30 (*)    Glucose, Bld 107 (*)    Calcium , Ion 1.07 (*)    Hemoglobin 9.2 (*)    HCT 27.0 (*)    All other components within normal limits  I-STAT CG4 LACTIC ACID, ED - Abnormal; Notable for the following components:   Lactic Acid, Venous 3.2 (*)    All other components within normal limits  CULTURE, BLOOD (ROUTINE X 2)  CULTURE, BLOOD (ROUTINE X 2)  RESP PANEL BY RT-PCR (RSV, FLU A&B, COVID)  RVPGX2  MRSA NEXT GEN BY PCR, NASAL  URINALYSIS, W/ REFLEX TO CULTURE (INFECTION SUSPECTED)  HEMOGLOBIN AND HEMATOCRIT, BLOOD  HEMOGLOBIN AND HEMATOCRIT, BLOOD  HEMOGLOBIN AND HEMATOCRIT, BLOOD  HEMOGLOBIN AND HEMATOCRIT, BLOOD  IRON  AND TIBC  FERRITIN  I-STAT CG4 LACTIC ACID, ED  TYPE AND SCREEN  PREPARE RBC (CROSSMATCH)    EKG: EKG Interpretation Date/Time:  Monday April 10 2024 10:50:23 EDT Ventricular Rate:  91 PR Interval:  154 QRS Duration:  87 QT Interval:  378 QTC Calculation: 466 R Axis:   100  Text Interpretation: Sinus rhythm Atrial premature complexes Low voltage with right axis deviation Minimal ST depression, lateral leads Confirmed by Mozell Arias [phone]) on 04/10/2024 11:28:49 AM  Radiology: DG Chest Portable 1 View Result Date: 04/10/2024 CLINICAL DATA:  Hypoxia EXAM: PORTABLE CHEST 1 VIEW COMPARISON:  Mar 09, 2024 FINDINGS: Persistent bilateral pulmonary infiltrates that correlate with congestive changes with persistent bilateral pleural effusions right  larger than left left. Basilar atelectasis. Calcification of the annulus of the mitral valve. Heart upper limits of normal. IMPRESSION: Mild residual congestive changes and pleural effusions Electronically Signed   By: Fredrich Jefferson M.D.   On: 04/10/2024 11:59     Procedures   Medications Ordered in the ED  norepinephrine  (LEVOPHED ) 4mg  in (0.016 mg/mL) premix infusion (2 mcg/min Intravenous New Bag/Given 04/10/24 1258)  acetaminophen  (TYLENOL ) tablet 650 mg (has no administration in time range)    Or  acetaminophen  (TYLENOL ) suppository 650 mg (has no administration in time range)  senna (SENOKOT) tablet 8.6 mg (has no administration in time range)  albuterol  (PROVENTIL ) (2.5 MG/3ML) 0.083% nebulizer solution 2.5 mg (has no administration in time range)  cefTRIAXone  (ROCEPHIN ) 2 g in sodium chloride  0.9 % 100 mL IVPB (has no administration in time range)  vancomycin (VANCOREADY) IVPB 1500 mg/300 mL (has no administration in time range)  pantoprazole  (PROTONIX ) injection 40 mg (40 mg Intravenous Given 04/10/24 1059)  0.9 %  sodium chloride  infusion (Manually program via Guardrails IV Fluids) (0 mLs Intravenous Stopped 04/10/24 1254)                                    Medical Decision Making Amount and/or Complexity of Data Reviewed Labs: ordered. Radiology: ordered.  Risk Prescription drug management. Decision regarding hospitalization.   Patient with hypertension hypoxia.  History of GI bleeds.  Has had to have transfusions.  Reportedly black stools.  Differential diagnosis does include GI bleed.  Reviewed recent discharge note.  Will give small fluid bolus.  Is a dialysis patient due for dialysis tomorrow.  With hypoxia is questionable for volume overload.  Will get x-ray.  With hypotension and guaiac positive stools.  Recent anemia patient transfused with emergent release blood.  Presumed hemorrhagic shock.  Patient was not febrile.  Discussed with patient's daughter.  Reportedly hemoglobin was recently 9.  Hemoglobin came back at 8.7.  However this potentially could be before adjustment from an active bleed.  Blood pressure is improving.  Now 90s to 100 systolic.  X-ray stable to prior.  No infection seen.  Will discuss with internal medicine for admission since got off their service under a month ago.   CRITICAL CARE Performed by: Mozell Arias Total critical care time: 30 minutes Critical care time was exclusive of separately billable procedures and treating other patients. Critical care was necessary to treat or prevent imminent or life-threatening deterioration. Critical care was time spent personally by me on the following activities: development of treatment plan with patient and/or surrogate as well as nursing, discussions with consultants, evaluation of patient's response to treatment, examination of patient, obtaining history from patient or surrogate, ordering and performing treatments and interventions, ordering and review of laboratory studies, ordering and review of radiographic studies, pulse oximetry and re-evaluation of patient's condition.  Patient has had return of the hypotension.  Mental status decreased somewhat again.  Blood pressure still reassuring.  Will start peripheral Levophed .     Final diagnoses:  Gastrointestinal hemorrhage, unspecified gastrointestinal hemorrhage type  Hypotension, unspecified hypotension type    ED Discharge Orders     None          Mozell Arias, MD 04/10/24 1218    Mozell Arias, MD 04/10/24 [phone]

## 2024-04-10 NOTE — Progress Notes (Incomplete)
 Attending note: I have seen and examined the patient. History, labs and imaging reviewed.  83 y.o. with a pertinent PMH of ESRD on HD TThS, T2DM, nonrheumatic mitral valve regurgitation, HFpEF, advanced PVD, anemia of chronic disease, and hx of duodenal/jejunal angioectasias admitted, hemoglobin 8.7. No recent admission on May 2025 for acute on chronic anemia and had EGD and capsule endoscopy without clear source of bleed. 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 During that procedure, pt experience bradycardic arrest and quick ROSC after brief CPR.  Had a fall at home on 6/10 after she tripped.  Evaluated at [facility] with negative chest x-ray knee and CT head and significant for periorbital swelling    Blood pressure (!) 109/56, pulse 91, temperature 98.4 F (36.9 C), temperature source Oral, resp. rate (!) 23, SpO2 100%. Gen:      No acute distress, right eye redness, periorbital swelling, bruising over right side of the face HEENT:  EOMI, sclera anicteric Neck:     No masses; no thyromegaly Lungs:    Clear to auscultation bilaterally; normal respiratory effort CV:         Regular rate and rhythm; no murmurs Abd:      + bowel sounds; soft, non-tender; no palpable masses, no distension Ext:    Ulceration over toes on the right, right lower extremity bandages Neuro: alert and oriented x 3 Psych: normal mood and affect   Labs/Imaging personally reviewed, significant for Sodium 131, BUN/creatinine 52/6.30 AST 26, ALT 22, total bilirubin 1.3 WBC 9.0, hemoglobin 8.7, platelets 149 X-ray with congestive changes, persistent pleural effusions  Assessment/plan: Hypotension May have a GI bleed given recent admission for same and presentation with dark stools but hemoglobin is stable Not a candidate for further EGD due to cardiac arrest during last procedure Admit to the ICU.  Continue peripheral pressors.  Wean as tolerated Transfuse  PRBC Monitor labs.  Continue PPI Empiric antibiotic therapy Follow cultures.  The patient is critically ill with multiple organ systems failure and requires high complexity decision making for assessment and support, frequent evaluation and titration of therapies, application of advanced monitoring technologies and extensive interpretation of multiple databases.  Critical care time - 35 mins. This represents my time independent of the NPs time taking care of the pt.  Brendy Ficek MD [facility] 04/10/2024, 1:17 PM

## 2024-04-10 NOTE — ED Notes (Signed)
 Per MD [place] emergency release bag to be administered in 30 minutes time.  658ml/hr rate.

## 2024-04-10 NOTE — Consult Note (Addendum)
 Attending physician's note   I have taken a history, reviewed the chart, and examined the patient. I performed a substantive portion of this encounter, including complete performance of at least one of the key components, in conjunction with the APP. I agree with the APP's note, impression, and recommendations with my edits.   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  She recovered in the ICU and was discharged home.  Readmitted now with shock, AMS (mental status now at baseline), hypertension.  Recent fall after dialysis last week..  GI service consulted for dark stools and anemia.  She does take oral iron  and stools tend to remain dark.  While she does have heme positive stool, hemoglobin not terribly different from recent hospital admission.  As previously discussed with the patient and her family and well-documented in the chart, I recommend against any endoscopic procedures due to significantly elevated periprocedural risks and prior life-threatening events.  Strong recommendation for conservative management alone from a GI bleed standpoint.  - Trend serial CBCs with blood products as needed per protocol - Continue periodic CBC checks as outpatient with blood products, IV iron , etc. as outpatient through the dialysis center -Infectious workup and management per primary ICU team - GI service will remain available on an as-needed basis  [name], DO, FACG [phone] office         Consultation  Referring Provider: No ref. provider found Primary Care Physician:  Abbe Hoard., MD Primary Gastroenterologist:  Dr.Danis  Reason for Consultation: Anemia, heme  positive stool  HPI: Anna Carr is a 83 y.o. 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 Patient was found by her family early this morning altered and weak in bed and by ER notes was hypoxic and hypotensive on arrival. Patient had related dark stools, but on further conversation she is on chronic iron  and says that her stools are always dark and she cannot tell the difference between melena and her usual dark stools. Currently admitted to [facility], has required pressors and is undergoing workup for hypotension with suspicion for sepsis.  Patient denies any abdominal pain, no nausea or vomiting, no change in her usual bowel habits. She is not on any antiplatelet therapy.  Patient had very recent capsule endoscopy on 02/29/2024 with finding of more active oozing of heme appeared to be coming from the mid small bowel, there was also a streak of heme noted just a couple of minutes beyond the first duodenal image.  Subsequent EGD was done on 03/02/2024 showing a normal esophagus, there were 2 nonbleeding AVMs in the duodenum treated with APC the examined portion of the jejunum was normal. Just after the conclusion of the procedure the patient developed bradycardia then asystole and required CPR with rapid ROSC and did not require shock.  Patient was admitted thereafter to the intensive care unit. She also had a procedure earlier in May with EGD and also had bradycardia during that procedure.  Decision was made by GI not to pursue any further endoscopic evaluation for this patient noting forward..  Labs today  BBC 9.0/hemoglobin 8.7/hematocrit 28.1 overall stable since 03/10/2024 when hemoglobin was 9.1/hematocrit 28.4 Sodium 134/potassium 5.1/BUN 56/creatinine 5.51 T. bili 1.3 LFTs otherwise unremarkable INR 1.3 Blood cultures  pending Lactate 3.2  Chest x-ray today shows persistent bilateral pulmonary infiltrates and persistent bilateral pleural effusions right greater than left.  Patient also relates that she had a fall last week due to a weakness at dialysis, she did not syncopized but hit her face. She also mentions that she was supposed to be considered for a new medication at the time of last discharge from the hospital due to hypotension but did not receive a prescription for that on discharge. .   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 difficile colitis 03/30/2021   Cardiac arrest ([name]) 03/02/2024   Cellulitis 03/16/2021   CHF with left ventricular diastolic dysfunction, NYHA class 1 ([name]) 02/02/2017   Cholelithiasis 03/30/2021   Chronic constipation 12/22/2015   Chronic laryngitis 02/17/2017   Last Assessment & Plan:   Formatting of this note might be different from the original.  Evaluation of larynx.  Was hospitalized for pneumonia and during that time she started noticing worsening hoarseness.  Denies any throat pain.  No obvious heartburn.  She did recently start PPI therapy.  She smoked in the past.  Denies any difficulty swallowing.  Hoarseness is improving.  EXAM shows mildly ras   Coagulation defect, unspecified ([name]) 12/07/2019   Colon  polyp    Complication of vascular dialysis catheter 03/05/2020   Contact with and (suspected) exposure to covid-19 10/28/2020   COPD mixed type ([name])    Cough, unspecified 10/28/2020   COVID-19 10/29/2020   Depression    Diverticulosis of large intestine without perforation or abscess without bleeding 01/08/2020   Dyspnea on exertion    Dyspnea, unspecified 11/02/2019   Elevated troponin 03/16/2021   Encounter for removal of sutures 12/28/2019   Encounter for screening for respiratory tuberculosis 12/06/2019   ESRD (end stage renal disease) ([name]) 07/18/2019   ESRD on dialysis ([name]) 09/18/2019   ESRD on hemodialysis ([name]) 01/02/2020   Fever, unspecified 01/23/2020   GI bleed 12/19/2021   GIB (gastrointestinal bleeding) 01/02/2020   Heme positive stool    High risk medication use 12/22/2015   History of blood transfusion    History of colonic polyps 01/08/2020   HLD (hyperlipidemia) 12/22/2015   Hypertension    Hypervolemia 11/13/2019   Hypokalemia 03/11/2019   Hyponatremia 03/11/2019   Hypovolemia 10/27/2021   Hypoxia 03/11/2019   Insomnia    Insulin  long-term use ([name]) 12/22/2015   Iron  deficiency anemia, unspecified 12/06/2019   Major depressive disorder, recurrent ([name]) 12/22/2015   Malaise and fatigue 12/23/2015   Mild protein-calorie malnutrition ([name]) 03/26/2020   Mitral regurgitation moderate to severe based on echocardiogram from 2022 11/03/2021   Musculoskeletal chest pain 03/03/2024   Osteoarthritis  Osteopenia    Other disorders of phosphorus metabolism 01/08/2020   PAC (premature atrial contraction) 10/04/2020   Pain, unspecified 12/06/2019   PAT (paroxysmal atrial tachycardia) ([name]) 10/04/2020   Peripheral vascular disease, unspecified ([name]) critical stenosis lower extremity 12/01/2023   Peripheral arterial disease     Pneumonia    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  Chodri CT annually.     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 dialysis, with long-term current use of insulin  ([name]) 12/23/2015   Type 2 diabetes mellitus, with long-term current use of insulin  ([name]) 03/11/2019   Unspecified hemorrhoids 01/08/2020    Past Surgical History:  Procedure Laterality Date   A/V FISTULAGRAM Left 02/17/2021   Procedure: A/V FISTULAGRAM;  Surgeon: Adine Hoof, MD;  Location: [facility] INVASIVE CV LAB;  Service: Cardiovascular;  Laterality: Left;   APPENDECTOMY     AV FISTULA PLACEMENT Left    AV FISTULA PLACEMENT Left 03/12/2021   Procedure: INSERTION OF LEFT UPPER ARM ARTERIOVENOUS (AV) GORE-TEX GRAFT;  Surgeon: Adine Hoof, MD;  Location: [facility] OR;  Service: Vascular;  Laterality: Left;   BALLOON ENTEROSCOPY  03/02/2024   Procedure: ENTEROSCOPY, USING BALLOON;  Surgeon: Annis Kinder, DO;  Location: [facility] ENDOSCOPY;  Service: Gastroenterology;;   COLONOSCOPY WITH PROPOFOL  N/A 03/18/2021   Procedure: COLONOSCOPY WITH PROPOFOL ;  Surgeon: Albertina Hugger, MD;  Location: [facility] ENDOSCOPY;  Service: Gastroenterology;  Laterality: N/A;   CYSTECTOMY     ENTEROSCOPY N/A 03/21/2021   Procedure: ENTEROSCOPY;  Surgeon: Albertina Hugger, MD;  Location: [facility] ENDOSCOPY;  Service: Gastroenterology;  Laterality: N/A;   ENTEROSCOPY N/A 12/20/2021   Procedure: ENTEROSCOPY;  Surgeon: Albertina Hugger, MD;  Location: [facility] ENDOSCOPY;  Service: Gastroenterology;  Laterality: N/A;   ENTEROSCOPY N/A 02/26/2024   Procedure: ENTEROSCOPY;  Surgeon: Nannette Babe, MD;  Location: [facility] ENDOSCOPY;  Service: Gastroenterology;  Laterality: N/A;   ENTEROSCOPY N/A 02/28/2024   Procedure: ENTEROSCOPY;  Surgeon: Annis Kinder, DO;   Location: [facility] ENDOSCOPY;  Service: Gastroenterology;  Laterality: N/A;   ESOPHAGOGASTRODUODENOSCOPY (EGD) WITH PROPOFOL  N/A 03/18/2021   Procedure: ESOPHAGOGASTRODUODENOSCOPY (EGD) WITH PROPOFOL ;  Surgeon: Albertina Hugger, MD;  Location[facility] ENDOSCOPY;  Service: Gastroenterology;  Laterality: N/A;   GIVENS CAPSULE STUDY N/A 03/19/2021   Procedure: GIVENS CAPSULE STUDY;  Surgeon: Albertina Hugger, MD;  Location: [facility] ENDOSCOPY;  Service: Gastroenterology;  Laterality: N/A;   GIVENS CAPSULE STUDY N/A 02/29/2024   Procedure: IMAGING PROCEDURE, GI TRACT, INTRALUMINAL, VIA CAPSULE;  Surgeon: Annis Kinder, DO;  Location: [facility] ENDOSCOPY;  Service: Gastroenterology;  Laterality: N/A;   HEMOSTASIS CLIP PLACEMENT  12/20/2021   Procedure: HEMOSTASIS CLIP PLACEMENT;  Surgeon: Albertina Hugger, MD;  Location: [facility] ENDOSCOPY;  Service: Gastroenterology;;   HOT HEMOSTASIS N/A 03/21/2021   Procedure: HOT HEMOSTASIS (ARGON PLASMA COAGULATION/BICAP);  Surgeon: Albertina Hugger, MD;  Location: [facility] ENDOSCOPY;  Service: Gastroenterology;  Laterality: N/A;   HOT HEMOSTASIS N/A 12/20/2021   Procedure: HOT HEMOSTASIS (ARGON PLASMA COAGULATION/BICAP);  Surgeon: Albertina Hugger, MD;  Location: [facility] ENDOSCOPY;  Service: Gastroenterology;  Laterality: N/A;   HOT HEMOSTASIS N/A 03/02/2024   Procedure: EGD, WITH ARGON PLASMA COAGULATION;  Surgeon: Annis Kinder, DO;  Location: [facility] ENDOSCOPY;  Service: Gastroenterology;  Laterality: N/A;   HYSTEROSCOPY     LOWER EXTREMITY ANGIOGRAPHY N/A 02/03/2024   Procedure: Lower Extremity Angiography;  Surgeon: Avanell Leigh, MD;  Location: [facility] INVASIVE CV LAB;  Service: Cardiovascular;  Laterality: N/A;   POLYPECTOMY  03/18/2021   Procedure: POLYPECTOMY;  Surgeon: Albertina Hugger, MD;  Location: [facility] ENDOSCOPY;  Service: Gastroenterology;;    Prior to Admission medications   Medication Sig Start Date End Date Taking? Authorizing Provider  acetaminophen  (TYLENOL ) 500 MG tablet  Take 500 mg by mouth every 6 (six) hours as needed for mild pain (pain score 1-3) or moderate pain (pain score 4-6).   Yes [provider]  ascorbic acid (VITAMIN C) 1000 MG tablet Take 1,000 mg by mouth at bedtime. 12/07/19  Yes [provider]  aspirin  EC 81 MG tablet Take 1 tablet (81 mg total) by mouth daily. Swallow whole. 12/01/23  Yes Krasowski, Robert J, MD  Biotin 5000 MCG TABS Take 5,000 mcg by mouth daily in the afternoon.   Yes [provider]  carboxymethylcellulose (REFRESH PLUS) 0.5 % SOLN Place 1 drop into both eyes 3 (three) times daily as needed (dry eyes).   Yes [provider]  carvedilol  (COREG ) 6.25 MG tablet Take 6.25 mg by mouth daily. 12/07/19  Yes [provider]  cyanocobalamin (VITAMIN B12) 1000 MCG tablet Take 1,000 mcg by mouth every evening.   Yes [provider]  ferric citrate  (AURYXIA ) 1 GM 210 MG(Fe) tablet Take 210 mg by mouth 3 (three) times daily with meals.   Yes [provider]  fluticasone  (FLONASE ) 50 MCG/ACT nasal spray Place 1 spray into both nostrils daily as needed (Congestion).   Yes [provider]  furosemide  (LASIX ) 80 MG tablet Take 80 mg by mouth daily in the afternoon. 04/03/21  Yes [provider]  ibuprofen (ADVIL) 200 MG tablet Take 200-400 mg by mouth every 6 (six) hours as needed for mild pain (pain score 1-3).   Yes [provider]  Insulin  Glargine-Lixisenatide  (SOLIQUA ) 100-33 UNT-MCG/ML SOPN Inject 6 Units into the skin in the morning. 03/11/24  Yes Zheng, Michael, DO  Lactobacillus (PROBIOTIC ACIDOPHILUS PO) Take 1 capsule by mouth in the morning.   Yes [provider]  lidocaine -prilocaine  (EMLA ) cream Apply 1 application topically as needed (prior to fisula access). Tuesday, Thursday, Saturday for dialysis 07/04/20  Yes [provider]  meloxicam (MOBIC) 7.5 MG tablet Take 7.5-15 mg by mouth daily as needed for pain. 04/05/24  Yes [provider]  Menthol -Methyl Salicylate  (MUSCLE RUB) 10-15 % CREA Apply 1 Application topically 2 (two) times daily. Apply to left shoulder. Patient taking differently: Apply 1 Application topically 2 (two) times daily as needed for muscle pain. Apply to left shoulder. 03/11/24  Yes Jearldine Mina, DO  Omega-3 Fatty Acids (FISH OIL PO) Take 1 capsule by mouth in the morning and at bedtime.   Yes [provider]  pantoprazole  (PROTONIX ) 40 MG tablet Take 1 tablet (40 mg total) by mouth 2 (two) times daily. 03/11/24  Yes Zheng, Michael, DO  polyethylene glycol (MIRALAX  / GLYCOLAX ) 17 g packet Take 17 g by mouth 2 (two) times daily as needed for moderate constipation.   Yes [provider]  Vitamin E 670 MG (1000 UT) CAPS Take 1,000 Units by mouth in the morning. 01/09/20  Yes [provider]  atorvastatin  (LIPITOR) 10 MG tablet Take 1 tablet (  10 mg total) by mouth daily. Patient not taking: Reported on 04/10/2024 12/01/23   Krasowski, Robert J, MD  benzonatate  (TESSALON ) 200 MG capsule Take 1 capsule (200 mg total) by mouth 3 (three) times daily as needed for cough. Patient not taking: Reported on 04/10/2024 03/11/24   Zheng, Michael, DO  cephALEXin (KEFLEX) 500 MG capsule Take 500 mg by mouth 2 (two) times daily. Patient not taking: Reported on 04/10/2024 04/07/24   [provider]  lidocaine  (LIDODERM ) 5 % Place 1 patch onto the skin daily. Remove & Discard patch within 12 hours or as directed by MD Patient not taking: Reported on 04/10/2024 03/11/24   Zheng, Michael, DO    Current Facility-Administered Medications  Medication Dose Route Frequency Provider Last Rate Last Admin   acetaminophen  (TYLENOL ) tablet 650 mg  650 mg Oral Q6H PRN Khan, Ghalib, MD       Or   acetaminophen  (TYLENOL ) suppository 650 mg  650 mg Rectal Q6H PRN Jackolyn Masker, MD       albuterol  (PROVENTIL ) (2.5 MG/3ML) 0.083% nebulizer solution 2.5 mg  2.5 mg Nebulization Q2H PRN Jackolyn Masker, MD        atorvastatin  (LIPITOR) tablet 10 mg  10 mg Oral Daily Jackolyn Masker, MD       cefTRIAXone  (ROCEPHIN ) 2 g in sodium chloride  0.9 % 100 mL IVPB  2 g Intravenous Q24H Khan, Ghalib, MD 200 mL/hr at 04/10/24 1520 2 g at 04/10/24 1520   [START ON 04/11/2024] cosyntropin (CORTROSYN) injection 0.25 mg  0.25 mg Intravenous Once Khan, Ghalib, MD       insulin  aspart (novoLOG ) injection 0-6 Units  0-6 Units Subcutaneous TID WC Khan, Ghalib, MD       naphazoline-pheniramine (NAPHCON-A) 0.025-0.3 % ophthalmic solution 1 drop  1 drop Right Eye BID Jackolyn Masker, MD       norepinephrine  (LEVOPHED ) 4mg  in (0.016 mg/mL) premix infusion  0-40 mcg/min Intravenous Continuous Mozell Arias, MD 7.5 mL/hr at 04/10/24 1500 2 mcg/min at 04/10/24 1500   pantoprazole  (PROTONIX ) injection 40 mg  40 mg Intravenous Q12H Khan, Ghalib, MD       senna [facility]) tablet 8.6 mg  1 tablet Oral Daily PRN Khan, Ghalib, MD       vancomycin (VANCOREADY) IVPB 1500 mg/300 mL  1,500 mg Intravenous Once Utomwen, Adesuwa, RPH 150 mL/hr at 04/10/24 1557 1,500 mg at 04/10/24 1557   vancomycin variable dose per unstable renal function (pharmacist dosing)   Does not apply See admin instructions Mannam, Praveen, MD        Allergies as of 04/10/2024 - Review Complete 04/10/2024  Allergen Reaction Noted   Atorvastatin  Other (See Comments) 04/10/2024    Family History  Problem Relation Age of Onset   Diabetes Mother    Hypertension Mother    Diabetes Maternal Grandfather    Liver disease Other     Social History   Socioeconomic History   Marital status: Widowed    Spouse name: Not on file   Number of children: 3   Years of education: Not on file   Highest education level: Not on file  Occupational History   Not on file  Tobacco Use   Smoking status: Former    Types: Cigarettes   Smokeless tobacco: Never   Tobacco comments:    quit in 1973  Vaping Use   Vaping status: Never Used  Substance and Sexual Activity    Alcohol  use: Not Currently   Drug use: Never  Sexual activity: Not Currently  Other Topics Concern   Not on file  Social History Narrative   Not on file   Social Drivers of Health   Financial Resource Strain: Low Risk  (10/27/2021)   Received from [facility]   Overall Financial Resource Strain (CARDIA)    Difficulty of Paying Living Expenses: Not hard at all  Food Insecurity: No Food Insecurity (04/10/2024)   Hunger Vital Sign    Worried About Running Out of Food in the Last Year: Never true    Ran Out of Food in the Last Year: Never true  Transportation Needs: No Transportation Needs (04/10/2024)   PRAPARE - Administrator, Civil Service (Medical): No    Lack of Transportation (Non-Medical): No  Physical Activity: Not on file  Stress: No Stress Concern Present (10/27/2021)   Received from [facility] - Occupational Stress Questionnaire    Feeling of Stress : Not at all  Social Connections: Socially Isolated (04/10/2024)   Social Connection and Isolation Panel    Frequency of Communication with Friends and Family: Twice a week    Frequency of Social Gatherings with Friends and Family: Three times a week    Attends Religious Services: Never    Active Member of Clubs or Organizations: No    Attends Banker Meetings: Never    Marital Status: Widowed  Intimate Partner Violence: Not At Risk (02/26/2024)   Humiliation, Afraid, Rape, and Kick questionnaire    Fear of Current or Ex-Partner: No    Emotionally Abused: No    Physically Abused: No    Sexually Abused: No    Review of Systems: Pertinent positive and negative review of systems were noted in the above HPI section.  All other review of systems was otherwise negative.   Physical Exam: Vital signs in last 24 hours: Temp:  [98.3 F (36.8 C)-98.9 F (37.2 C)] 98.9 F (37.2 C) (06/16 1500) Pulse Rate:  [76-94] 81 (06/16 1530) Resp:  [22-27] 27 (06/16  1530) BP: (73-109)/(36-58) 109/46 (06/16 1530) SpO2:  [90 %-100 %] 90 % (06/16 1530) Weight:  [73.6 kg] 73.6 kg (06/16 1500) Last BM Date : 04/10/24 General:   Alert,  Well-developed, chronically ill-appearing elderly white female, pleasant and cooperative in NAD Head:  Normocephalic tensive facial bruising over the right half of the face Eyes:  Sclera clear, no icterus.   Conjunctiva hemorrhage on the right Ears:  Normal auditory acuity. Nose:  No deformity, discharge,  or lesions. Mouth:  No deformity or lesions.   Neck:  Supple; no masses or thyromegaly. Lungs:  Clear throughout to auscultation.   No wheezes, crackles, or rhonchi.  Heart:  Regular rate and rhythm; no murmurs, clicks, rubs,  or gallops. Abdomen:  Soft,nontender, BS active,nonpalp mass or hsm.   Rectal: Done, documented heme positive Msk:  Symmetrical without gross deformities. . Pulses:  Normal pulses noted. Extremities:  Without clubbing or edema.  Small ulcers on 2 toes on the right foot Neurologic:  Alert and  oriented x4;  grossly normal neurologically. Skin:  Intact without significant lesions or rashes.. Psych:  Alert and cooperative. Normal mood and affect.  Intake/Output from previous day: No intake/output data recorded. Intake/Output this shift: Total I/O In: 20 [I.V.:20] Out: -   Lab Results: Recent Labs    04/10/24 1106 04/10/24 1112 04/10/24 1520  WBC 9.0  --   --   HGB 8.7* 9.2* 9.1*  HCT 28.1*  27.0* 28.9*  PLT 149*  --   --    BMET Recent Labs    04/10/24 1106 04/10/24 1112  NA 134* 131*  K 5.1 5.0  CL 93* 94*  CO2 24  --   GLUCOSE 105* 107*  BUN 56* 52*  CREATININE 5.51* 6.30*  CALCIUM  9.2  --    LFT Recent Labs    04/10/24 1106  PROT 5.8*  ALBUMIN  2.6*  AST 26  ALT 22  ALKPHOS 126  BILITOT 1.3*   PT/INR Recent Labs    04/10/24 1106  LABPROT 15.9*  INR 1.3*   Hepatitis Panel No results for input(s): HEPBSAG, HCVAB, HEPAIGM, HEPBIGM in the last 72  hours.    IMPRESSION:  #67 83 year old white female found weak and altered in bed at home this morning by family, hypotensive and hypoxic on admission. Currently requiring pressor support Workup in progress as to etiology-suspect sepsis  While she does have heme positive stool and chronically has dark stools she is not having evidence currently of active GI bleeding and doubt that GI bleeding precipitated her presentation this morning Hemoglobin on admission stable for her  CT chest abdomen pelvis pending  #2 end-stage renal disease on hemodialysis #3 history of congestive heart failure with preserved EF #4 history of recurrent anemia and low-grade GI bleeding secondary to previously documented small bowel AVMs. She had APC of duodenal AVMs in May 2025 and also was documented on capsule endoscopy to have evidence of bleeding in her mid small bowel/low-grade again likely secondary to AVMs but beyond the reach of enteroscopy  Patient had bradycardic arrest immediately after last endoscopic procedure required CPR, and also had hemodynamically significant bradycardia with EGD done earlier in May.  Plan; IV PPI once daily Trend hemoglobin and transfuse as indicated We do not plan any further endoscopic evaluation on this patient, and would manage her supportively now and in the future for any GI bleeding. Await results of CT chest abdomen and pelvis.   Amy EsterwoodPA-C  04/10/2024, 4:20 PM

## 2024-04-10 NOTE — Progress Notes (Addendum)
 Pharmacy Antibiotic Note  AMYBETH SIEG is a 83 y.o. female admitted on 04/10/2024 with shock and now with sepsis per consult.  Pharmacy has been consulted for vancomycin and cefepime dosing.  Patient has a history of ESRD on HD (normally TThSat). Now on vasopressors.   Plan: Was given vancomycin 1500 mg x1, order for PRN dosing based on dialysis Cefepime 1000 mg daily  Follow-up for HD schedule vs other modalities of RRT  Follow-up MRSA nasal PCR and culture results   Height: 5' 1 (154.9 cm) Weight: 73.6 kg (162 lb 4.1 oz) IBW/kg (Calculated) : 47.8  Temp (24hrs), Avg:98.5 F (36.9 C), Min:98.3 F (36.8 C), Max:98.9 F (37.2 C)  Recent Labs  Lab 04/10/24 1106 04/10/24 1112 04/10/24 1338  WBC 9.0  --   --   CREATININE 5.51* 6.30*  --   LATICACIDVEN  --   --  3.2*    Estimated Creatinine Clearance: 6.3 mL/min (A) (by C-G formula based on SCr of 6.3 mg/dL (H)).    Allergies  Allergen Reactions   Atorvastatin  Other (See Comments)    Constipation    Antimicrobials this admission: Ceftriaxone  6/16 x1 Cefepime 6/16 >>  Vancomycin 6/16 >> c  Microbiology results: 6/16 BCx: pending 6/16 MRSA PCR: pending  Thank you for allowing pharmacy to be a part of this patient's care.  Patience Bonito 04/10/2024 3:26 PM

## 2024-04-10 NOTE — Progress Notes (Signed)
 eLink Physician-Brief Progress Note Patient Name: RANI IDLER DOB: 29-Aug-1941 MRN: [id number]   Date of Service  04/10/2024  HPI/Events of Note  83 y.o. with a pertinent PMH of ESRD on HD TThS, T2DM, nonrheumatic mitral valve regurgitation, HFpEF, advanced PVD, anemia of chronic disease, and hx of duodenal/jejunal angioectasias admitted for  sepsis. Having nausea, no PRNs available.  QTc 466  eICU Interventions  Zofran  as needed     Intervention Category Minor Interventions: Routine modifications to care plan (e.g. PRN medications for pain, fever)  Jemila Camille 04/10/2024, 8:50 PM

## 2024-04-10 NOTE — ED Notes (Signed)
 Emergency release blood ordered by MD Val Garin

## 2024-04-10 NOTE — H&P (Addendum)
 NAME:  Anna Carr, MRN:  [id number], DOB:  06-09-1941, LOS: 0 ADMISSION DATE:  04/10/2024, CONSULTATION DATE:  06/16 REFERRING MD:  EDP CHIEF COMPLAINT:  hypotension   History of Present Illness:  Pt is a 83 year old female with PMHx of chronic anemia 2/2 angioectasias, ESRD on HD TThSat for 5 years, IDDM, HFpEF presenting to the ED after being found altered and weak by her son.   Per the son, she was at baseline yesterday and was weak and not following commands this am. They didn't note anything out of the ordinary for this patient. On further hx, pt had a fall 06/10. She has some wounds after the fall and got CTH which was negative. It appears she was started on Keflex for her wound as a precaution. Per pt and son, they have not picked this up yet. Of note, her aspirin  was held during admission but pt reports she restarted this medication.   At baseline pt is independent in ADLs but needs assistance with iADLs. She needs cane to mobilize.     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 States her right foot hurts. Denies any headaches, chest pain or any other acute problems.   Objective   Blood pressure (!) 94/39, pulse 84, temperature 98.9 F (37.2 C), temperature source Oral, resp. rate (!) 24, height 5' 1 (1.549 m), weight 73.6 kg, SpO2 99%.       No intake or output data in the 24 hours ending 04/10/24 1508 Filed Weights   04/10/24 1500  Weight: 73.6 kg    Examination: General: NAD, sitting on bed HENT: bruising on right face, nasal canula in place, decreased visual acuity in right eye Lungs: CTAB Cardiovascular: sinus rhythm, decreases peripheral pulses Abdomen: TTP of epigastric/umbilical region, good  bowel sounds Extremities: no asymmetry, bilateral lower extremity pitting edema Neuro: alert and oriented x4, good strength and sensation GU: no foley catheter  Labs   CBC: Recent Labs  Lab 04/10/24 1106 04/10/24 1112  WBC 9.0  --   NEUTROABS 8.1*  --   HGB 8.7* 9.2*  HCT 28.1* 27.0*  MCV 102.9*  --   PLT 149*  --    Basic Metabolic Panel: Recent Labs  Lab 04/10/24 1106 04/10/24 1112  NA 134* 131*  K 5.1 5.0  CL 93* 94*  CO2 24  --   GLUCOSE 105* 107*  BUN 56* 52*  CREATININE 5.51* 6.30*  CALCIUM  9.2  --    GFR: Estimated Creatinine Clearance: 6.3 mL/min (A) (by C-G formula based on SCr of 6.3 mg/dL (H)). Recent Labs  Lab 04/10/24 1106 04/10/24 1338  WBC 9.0  --   LATICACIDVEN  --  3.2*   Liver Function Tests: Recent Labs  Lab 04/10/24 1106  AST 26  ALT 22  ALKPHOS 126  BILITOT 1.3*  PROT 5.8*  ALBUMIN  2.6*   No results for input(s): LIPASE, AMYLASE in the last 168 hours. No results for input(s): AMMONIA in the last 168 hours. ABG    Component Value Date/Time   PHART 7.315 (L) 03/02/2024 1223   PCO2ART 44.1 03/02/2024 1223   PO2ART 395 (H) 03/02/2024 1223   HCO3 22.5 03/02/2024 1223   TCO2 26 04/10/2024  1112   ACIDBASEDEF 3.0 (H) 03/02/2024 1223   O2SAT 100 03/02/2024 1223    Coagulation Profile: Recent Labs  Lab 04/10/24 1106  INR 1.3*   Cardiac Enzymes: No results for input(s): CKTOTAL, CKMB, CKMBINDEX, TROPONINI in the last 168 hours. HbA1C: Hgb A1c MFr Bld  Date/Time Value Ref Range Status  02/25/2024 03:00 PM 5.6 4.8 - 5.6 % Final    Comment:    (NOTE) Pre diabetes:          5.7%-6.4%  Diabetes:              >6.4%  Glycemic control for   <7.0% adults with diabetes   03/16/2021 12:33 AM 6.9 (H) 4.8 - 5.6 % Final    Comment:    (NOTE) Pre diabetes:          5.7%-6.4%  Diabetes:              >6.4%  Glycemic control for   <7.0% adults with diabetes    CBG: No results for input(s): GLUCAP in the last 168  hours.  Consults  GI consult Nephrology Assessment & Plan:  Principal Problem:   Shock, unspecified ([name]) Active Problems:   Anemia in chronic kidney disease   CHF with left ventricular diastolic dysfunction, NYHA class 1 ([name])   Hyponatremia   ESRD on hemodialysis ([name])   Hypertension   AVM (arteriovenous malformation)   Type 2 diabetes mellitus, with long-term current use of insulin  ([name])  Shock, undifferentiated Chronic anemia with baseline hemoglobin around 9 Hx of GI bleed, angioectasia  Exact etiology of her hypotension not known at the moment.  Could be multifactorial given her multiple chronic comorbidities.  On the differential is GI bleeding versus septic shock versus hypovolemic shock versus intra-abdominal bleeding secondary to fall.  Will monitor her H&H every 4 hours.  Goal hemoglobin greater than 7.  Given her history of angioectasia, concern for GI bleed will consult GI.  Less concern for active bleed at this time given her hgb but her report of stool change with hemo-occult positive. May need to discontinue aspirin  altogether.  Will start her on ceftriaxone  and vancomycin for empiric coverage.  My suspicion for infection is high given her low diastolic pressures which may be due to vasodilation from infectious process.  Will get urine cultures, Procal level.  Her CBC does show left shift along with a neutrophil predominance making this unlikely etiology.  Hypovolemic shock is possible but unlikely as patient is adherent to her hemodialysis.  No signs of volume overload on exam. Her pleural effusion appear secondary to third spacing due to hypoalbuminemia rather than volume overload.  She has chronic extremity edema. Will get echocardiogram to look for pericardial effusion as pt has pleural effusions and low voltages on EKG.  -Follow up H/H q4hrs. Transfuse as needed.  -Trend lactic acid, follow up CT imaging -IV PPI BID. Gi consult. Appreciate their input.  -Continue abx and  look for source of infection.  -Follow up cultures, pro cal -Support pressure with levophed  but wean as able  ESRD on HD -HD on TThSat.  Will consult nephrology to make them aware that she is admitted.  No urgent HD needs currently.  Right lower extremity wounds: Secondary to fall. Started on keflex by PCP but didn't start. Pt does have some green drainage from the wound but overall wound is small in size. Wound care consulted.   Blurry vision Right eye with blurry vision. Per patient this has been the  case since her fall. No pain, or drainage from the eye. Has vision in all fields but states it is blurry. Eye was shut on my initial exam which appears to be the case when she followed with PCP few days ago and was opened by me. There was crusting around her right eye. Will monitor for improvement. May need ophthalmology visit outpatient but no acute needs identified. Advised pt to report any changes.   Elevated bilirubin: Mild. Continue to trend.  Insulin -dependent diabetes mellitus -Patient currently n.p.o. and will find etiology of her hypotension.  Will start SSI.  Hyperlipidemia/CAD - LDL 82 in 03/25.  Continue statin  Chronic hyponatremia -Improved.  At 134.  CTM  HTN/HFpEF Chronic. Holding home coreg  and lasix . Will get echo as pt has pleural effusion and is in shock to look for any pericardial effusion.   Failure to thrive:  Pt with multiple co-morbidities who has poor functional status with recent worsening. She has multiple wounds from her recent falls. Will consult dietician and PT/OT to help improve her functional status. She has poor long term prognosis given her ESRD and functional decline. She may benefit from GOC conversations at some point.   Best Practice (right click and Reselect all SmartList Selections daily)  Diet/type: NPO DVT prophylaxis: SCD GI prophylaxis: PPI Lines: PIV Foley:  N/A Continuous: Levophed   Code Status:  full code Last date of  multidisciplinary goals of care discussion [06/16]  Home Medications  Prior to Admission medications   Medication Sig Start Date End Date Taking? Authorizing Provider  aspirin  EC 81 MG tablet Take 1 tablet (81 mg total) by mouth daily. Swallow whole. 12/01/23  Yes Krasowski, Robert J, MD  Biotin 5000 MCG TABS Take 5,000 mcg by mouth daily in the afternoon.   Yes [provider]  carvedilol  (COREG ) 6.25 MG tablet Take 6.25 mg by mouth daily. 12/07/19  Yes [provider]  ferric citrate  (AURYXIA ) 1 GM 210 MG(Fe) tablet Take 210 mg by mouth 3 (three) times daily with meals.   Yes [provider]  furosemide  (LASIX ) 80 MG tablet Take 80 mg by mouth daily in the afternoon. 04/03/21  Yes [provider]  Insulin  Glargine-Lixisenatide  (SOLIQUA ) 100-33 UNT-MCG/ML SOPN Inject 6 Units into the skin in the morning. 03/11/24  Yes Jearldine Mina, DO  Omega-3 Fatty Acids (FISH OIL PO) Take 1 capsule by mouth in the morning and at bedtime.   Yes [provider]  pantoprazole  (PROTONIX ) 40 MG tablet Take 1 tablet (40 mg total) by mouth 2 (two) times daily. 03/11/24  Yes Zheng, Michael, DO  acetaminophen  (TYLENOL ) 500 MG tablet Take 500 mg by mouth every 6 (six) hours as needed for mild pain (pain score 1-3) or moderate pain (pain score 4-6).    [provider]  ascorbic acid (VITAMIN C) 1000 MG tablet Take 1,000 mg by mouth daily in the afternoon. 12/07/19   [provider]  atorvastatin  (LIPITOR) 10 MG tablet Take 1 tablet (10 mg total) by mouth daily. Patient not taking: Reported on 04/10/2024 12/01/23   Krasowski, Robert J, MD  benzonatate  (TESSALON ) 200 MG capsule Take 1 capsule (200 mg total) by mouth 3 (three) times daily as needed for cough. 03/11/24   Zheng, Michael, DO  carboxymethylcellulose (REFRESH PLUS) 0.5 % SOLN Place 1 drop into both eyes 3 (three) times daily as needed (dry eyes).    [provider]  cyanocobalamin (VITAMIN B12) 1000  MCG tablet Take 1,000 mcg by mouth every  evening.    [provider]  fluticasone  (FLONASE ) 50 MCG/ACT nasal spray Place 1 spray into both nostrils daily as needed (Congestion).    [provider]  Lactobacillus (PROBIOTIC ACIDOPHILUS PO) Take 1 capsule by mouth in the morning.    [provider]  lidocaine  (LIDODERM ) 5 % Place 1 patch onto the skin daily. Remove & Discard patch within 12 hours or as directed by MD 03/11/24   Jearldine Mina, DO  lidocaine -prilocaine  (EMLA ) cream Apply 1 application topically as needed (prior to fisula access). Tuesday, Thursday, Saturday for dialysis 07/04/20   [provider]  Menthol -Methyl Salicylate  (MUSCLE RUB) 10-15 % CREA Apply 1 Application topically 2 (two) times daily. Apply to left shoulder. 03/11/24   Zheng, Michael, DO  polyethylene glycol (MIRALAX  / GLYCOLAX ) 17 g packet Take 17 g by mouth daily as needed for moderate constipation.    [provider]  Vitamin E 670 MG (1000 UT) CAPS Take 1,000 Units by mouth in the morning. 01/09/20   [provider]    Allergies Allergies  Allergen Reactions   Atorvastatin  Other (See Comments)    Constipation

## 2024-04-11 ENCOUNTER — Inpatient Hospital Stay (HOSPITAL_COMMUNITY)

## 2024-04-11 DIAGNOSIS — J90 Pleural effusion, not elsewhere classified: Secondary | ICD-10-CM

## 2024-04-11 LAB — RENAL FUNCTION PANEL
Albumin: 2.7 g/dL — ABNORMAL LOW (ref 3.5–5.0)
Anion gap: 18 — ABNORMAL HIGH (ref 5–15)
BUN: 76 mg/dL — ABNORMAL HIGH (ref 8–23)
CO2: 22 mmol/L (ref 22–32)
Calcium: 9 mg/dL (ref 8.9–10.3)
Chloride: 85 mmol/L — ABNORMAL LOW (ref 98–111)
Creatinine, Ser: 6.11 mg/dL — ABNORMAL HIGH (ref 0.44–1.00)
GFR, Estimated: 6 mL/min — ABNORMAL LOW (ref >60)
Glucose, Bld: 224 mg/dL — ABNORMAL HIGH (ref 70–99)
Phosphorus: 5.1 mg/dL — ABNORMAL HIGH (ref 2.5–4.6)
Potassium: 4.7 mmol/L (ref 3.5–5.1)
Sodium: 125 mmol/L — ABNORMAL LOW (ref 135–145)

## 2024-04-11 LAB — BPAM RBC
Blood Product Expiration Date: 202507112359
ISSUE DATE / TIME: 202506161059
Unit Type and Rh: 5100

## 2024-04-11 LAB — TYPE AND SCREEN
ABO/RH(D): A POS
Antibody Screen: NEGATIVE
Unit division: 0

## 2024-04-11 LAB — LACTATE DEHYDROGENASE: LDH: 241 U/L — ABNORMAL HIGH (ref 98–192)

## 2024-04-11 LAB — CBC
HCT: 36 % (ref 36.0–46.0)
Hemoglobin: 11.4 g/dL — ABNORMAL LOW (ref 12.0–15.0)
MCH: 31 pg (ref 26.0–34.0)
MCHC: 31.7 g/dL (ref 30.0–36.0)
MCV: 97.8 fL (ref 80.0–100.0)
Platelets: 164 10*3/uL (ref 150–400)
RBC: 3.68 MIL/uL — ABNORMAL LOW (ref 3.87–5.11)
RDW: 18.5 % — ABNORMAL HIGH (ref 11.5–15.5)
WBC: 10.2 10*3/uL (ref 4.0–10.5)
nRBC: 0 % (ref 0.0–0.2)

## 2024-04-11 LAB — GLUCOSE, PLEURAL OR PERITONEAL FLUID: Glucose, Fluid: 141 mg/dL

## 2024-04-11 LAB — COMPREHENSIVE METABOLIC PANEL WITH GFR
ALT: 26 U/L (ref 0–44)
AST: 28 U/L (ref 15–41)
Albumin: 2.7 g/dL — ABNORMAL LOW (ref 3.5–5.0)
Alkaline Phosphatase: 146 U/L — ABNORMAL HIGH (ref 38–126)
Anion gap: 19 — ABNORMAL HIGH (ref 5–15)
BUN: 77 mg/dL — ABNORMAL HIGH (ref 8–23)
CO2: 21 mmol/L — ABNORMAL LOW (ref 22–32)
Calcium: 9.1 mg/dL (ref 8.9–10.3)
Chloride: 85 mmol/L — ABNORMAL LOW (ref 98–111)
Creatinine, Ser: 6.29 mg/dL — ABNORMAL HIGH (ref 0.44–1.00)
GFR, Estimated: 6 mL/min — ABNORMAL LOW (ref >60)
Glucose, Bld: 223 mg/dL — ABNORMAL HIGH (ref 70–99)
Potassium: 4.8 mmol/L (ref 3.5–5.1)
Sodium: 125 mmol/L — ABNORMAL LOW (ref 135–145)
Total Bilirubin: 1.2 mg/dL (ref 0.0–1.2)
Total Protein: 6.1 g/dL — ABNORMAL LOW (ref 6.5–8.1)

## 2024-04-11 LAB — BODY FLUID CELL COUNT WITH DIFFERENTIAL
Eos, Fluid: 2 %
Lymphs, Fluid: 49 %
Monocyte-Macrophage-Serous Fluid: 31 % — ABNORMAL LOW (ref 50–90)
Neutrophil Count, Fluid: 18 % (ref 0–25)
Total Nucleated Cell Count, Fluid: 98 uL (ref 0–1000)

## 2024-04-11 LAB — GLUCOSE, CAPILLARY
Glucose-Capillary: 122 mg/dL — ABNORMAL HIGH (ref 70–99)
Glucose-Capillary: 205 mg/dL — ABNORMAL HIGH (ref 70–99)
Glucose-Capillary: 220 mg/dL — ABNORMAL HIGH (ref 70–99)
Glucose-Capillary: 181 mg/dL — ABNORMAL HIGH (ref 70–99)
Glucose-Capillary: 220 mg/dL — ABNORMAL HIGH (ref 70–99)

## 2024-04-11 LAB — LACTATE DEHYDROGENASE, PLEURAL OR PERITONEAL FLUID: LD, Fluid: 112 U/L — ABNORMAL HIGH (ref 3–23)

## 2024-04-11 LAB — HEPATITIS B SURFACE ANTIGEN: Hepatitis B Surface Ag: NONREACTIVE

## 2024-04-11 LAB — PROTEIN, PLEURAL OR PERITONEAL FLUID: Total protein, fluid: <3 g/dL

## 2024-04-11 LAB — LACTIC ACID, PLASMA: Lactic Acid, Venous: 2.8 mmol/L (ref 0.5–1.9)

## 2024-04-11 MED ORDER — FERRIC CITRATE 1 GM 210 MG(FE) PO TABS
420.0000 mg | ORAL_TABLET | Freq: Three times a day (TID) | ORAL | Status: DC
  Administered 2024-04-11 – 2024-04-14 (×5): 420 mg via ORAL
  Filled 2024-04-11 (×11): qty 2

## 2024-04-11 MED ORDER — CALCITRIOL 0.25 MCG PO CAPS
0.2500 ug | ORAL_CAPSULE | ORAL | Status: DC
  Filled 2024-04-11: qty 1

## 2024-04-11 MED ORDER — DARBEPOETIN ALFA 200 MCG/0.4ML IJ SOSY
200.0000 ug | PREFILLED_SYRINGE | INTRAMUSCULAR | Status: DC
  Filled 2024-04-11: qty 0.4

## 2024-04-11 MED ORDER — ENSURE PLUS HIGH PROTEIN PO LIQD
237.0000 mL | Freq: Two times a day (BID) | ORAL | Status: DC
  Administered 2024-04-11: 237 mL via ORAL

## 2024-04-11 MED ORDER — SODIUM ZIRCONIUM CYCLOSILICATE 10 G PO PACK
10.0000 g | PACK | Freq: Once | ORAL | Status: AC
  Administered 2024-04-11: 10 g via ORAL
  Filled 2024-04-11: qty 1

## 2024-04-11 MED ORDER — VANCOMYCIN HCL 750 MG/150ML IV SOLN
750.0000 mg | INTRAVENOUS | Status: DC
  Filled 2024-04-11: qty 150

## 2024-04-11 NOTE — Evaluation (Signed)
 Physical Therapy Evaluation Patient Details Name: Anna Carr MRN: [id number] DOB: 04/13/1941 Today's Date: 04/11/2024  History of Present Illness  Pt is an 83 y/o F presenting to ED on 6/16 wtih possible GIB after fall, dark stools, admittedf or AMS and shock. Recently here 1 month ago for similar. PMH includes ESRD on HD, DM2, nonrheumatic mitral valve regurgitation, HFpEF, advanced PVD, anemia of chronic disease, duodenal/jejunal angioectasais, small bowel enteroscopy on 5/3 complicated by bradycardia, VCE then repeat enteroscopy on 5/8 which complicated by hypoxia and bradycardia and asystole requriing CPR with rapid ROSC.  Clinical Impression  PTA pt living with family in single story home with ramped entrance. Pt independent in ambulation with RW in home uses wheelchair for mobilization in community. Family assists for set with ADLs. Pt is currently limited in safe mobility by increased O2 demand, decreased skin integrity, and decreased strength, balance and endurance. Pt is min A for bed mobility and min Ax2 for transfer to and from [facility]. Pt recommending HHPT at discharge and hospital bed with air mattress overlay due to DTI to heel and possibly sacrum. PT will continue to follow acutely.       If plan is discharge home, recommend the following: A little help with walking and/or transfers;A little help with bathing/dressing/bathroom;Assistance with cooking/housework;Direct supervision/assist for medications management;Direct supervision/assist for financial management;Assist for transportation;Help with stairs or ramp for entrance   Can travel by private vehicle    Yes    Equipment Recommendations Other (comment) (hospital bed with air mattress overlay for pressure injuries)     Functional Status Assessment Patient has had a recent decline in their functional status and demonstrates the ability to make significant improvements in function in a reasonable and predictable amount of  time.     Precautions / Restrictions Precautions Precautions: Fall Restrictions Weight Bearing Restrictions Per Provider Order: No      Mobility  Bed Mobility Overal bed mobility: Needs Assistance Bed Mobility: Supine to Sit, Sit to Supine     Supine to sit: Min assist Sit to supine: Min assist        Transfers Overall transfer level: Needs assistance Equipment used: Rolling walker (2 wheels) Transfers: Sit to/from Stand, Bed to chair/wheelchair/BSC Sit to Stand: Min assist, +2 safety/equipment Stand pivot transfers: Min assist, +2 physical assistance, +2 safety/equipment              Ambulation/Gait               General Gait Details: deferred due to        Balance Overall balance assessment: Needs assistance Sitting-balance support: Feet supported Sitting balance-Leahy Scale: Fair     Standing balance support: During functional activity, Reliant on assistive device for balance Standing balance-Leahy Scale: Poor Standing balance comment: RW in standing                             Pertinent Vitals/Pain Pain Assessment Pain Assessment: Faces Faces Pain Scale: Hurts little more Pain Location: bilateral heels with weightbearing Pain Descriptors / Indicators: Grimacing, Guarding, Pressure Pain Intervention(s): Limited activity within patient's tolerance, Monitored during session, Repositioned    Home Living Family/patient expects to be discharged to:: Private residence Living Arrangements: Children (with son and daughter lives next door) Available Help at Discharge: Family;Available 24 hours/day Type of Home: House Home Access: Ramped entrance       Home Layout: One level Home Equipment: Rolling Walker (2 wheels);Grab bars -  tub/shower;Wheelchair - manual;Rollator (4 wheels);BSC/3in1;Cane - single point;Shower seat      Prior Function Prior Level of Function : Needs assist;History of Falls (last six months)              Mobility Comments: RW in the home, w/c to HD, RW does not fit into bathroom, holds onto walls in bathroom ADLs Comments: manages own meds, set up A for ADLs     Extremity/Trunk Assessment   Upper Extremity Assessment Upper Extremity Assessment: Defer to OT evaluation    Lower Extremity Assessment Lower Extremity Assessment: RLE deficits/detail;LLE deficits/detail RLE Deficits / Details: deep tissue injury to heel, injury to R shin, ROM and strength WFL RLE Sensation: WNL RLE Coordination: WNL LLE Deficits / Details: deep tissue injury to heel, ROM and strength WFL LLE Sensation: WNL LLE Coordination: WNL    Cervical / Trunk Assessment Cervical / Trunk Assessment: Kyphotic  Communication   Communication Communication: Impaired Factors Affecting Communication: Hearing impaired    Cognition Arousal: Alert Behavior During Therapy: WFL for tasks assessed/performed                             Following commands: Intact       Cueing Cueing Techniques: Verbal cues     General Comments General comments (skin integrity, edema, etc.): SpO2 down to 86% on RA with supine position, improved to low 90s sitting EOB and standing. 2L replaced at end of session        Assessment/Plan    PT Assessment Patient needs continued PT services  PT Problem List Decreased activity tolerance;Decreased mobility;Cardiopulmonary status limiting activity;Pain;Decreased skin integrity       PT Treatment Interventions DME instruction;Gait training;Functional mobility training;Therapeutic exercise;Therapeutic activities;Balance training;Cognitive remediation;Patient/family education    PT Goals (Current goals can be found in the Care Plan section)  Acute Rehab PT Goals PT Goal Formulation: With patient/family Time For Goal Achievement: 04/25/24 Potential to Achieve Goals: Fair    Frequency Min 2X/week        AM-PAC PT 6 Clicks Mobility  Outcome Measure Help needed turning  from your back to your side while in a flat bed without using bedrails?: None Help needed moving from lying on your back to sitting on the side of a flat bed without using bedrails?: A Little Help needed moving to and from a bed to a chair (including a wheelchair)?: A Little Help needed standing up from a chair using your arms (e.g., wheelchair or bedside chair)?: A Little Help needed to walk in hospital room?: A Little Help needed climbing 3-5 steps with a railing? : A Lot 6 Click Score: 18    End of Session Equipment Utilized During Treatment: Gait belt;Oxygen Activity Tolerance: Other (comment) (increased WoB and coughing) Patient left: in bed;with call bell/phone within reach;with family/visitor present Nurse Communication: Mobility status PT Visit Diagnosis: Unsteadiness on feet (R26.81);Other abnormalities of gait and mobility (R26.89);Muscle weakness (generalized) (M62.81);Difficulty in walking, not elsewhere classified (R26.2);Pain Pain - Right/Left:  (bilateral) Pain - part of body: Ankle and joints of foot    Time: 1610-9604 PT Time Calculation (min) (ACUTE ONLY): 36 min   Charges:   PT Evaluation $PT Eval Moderate Complexity: 1 Mod   PT General Charges $$ ACUTE PT VISIT: 1 Visit         Braelynn Benning B. Jewel Mortimer PT, DPT Acute Rehabilitation Services Please use secure chat or  Call Office [phone]   Acey Ace  [place] 04/11/2024, 12:05 PM

## 2024-04-11 NOTE — Consult Note (Signed)
 WOC Nurse Consult Note: Reason for Consult: re-consulted for heel wound Wound type: full thickness area; pin point opening right heel Pressure Injury POA: Yes Measurement: see nursing flow sheets Wound bed:100% clean, small full thickness opening Drainage (amount, consistency, odor) see nursing flow sheets Periwound:intact  Dressing procedure/placement/frequency: Silicone foam dressings to the    heels                    change every 3 days. ASSESS UNDER dressings each shift for any acute changes in the wounds.    Offload heels with Prevalon boots    Discussed POC with  bedside nurse.  Re consult if needed, will not follow at this time. Thanks  Debbi Strandberg [employer], RN,CWOCN, CNS, CWON-AP [phone])

## 2024-04-11 NOTE — TOC CM/SW Note (Signed)
 Transition of Care [facility]) - Inpatient Brief Assessment   Patient Details  Name: Anna Carr MRN: [id number] Date of Birth: January 05, 1941  Transition of [facility]) CM/SW Contact:    Tom-Johnson, Angelique Ken, RN Phone Number: 04/11/2024, 2:27 PM   Clinical Narrative:  Patient presented to the ED with report of dark Stools, found to be Hypotensive and Hypoxic on admit. Patient noted with bruising to Rt side of her Face. Patient reportedly had a recent Fall when she sustained Bruising.  Patient was evaluated at [facility] with negative Chest X-ray, CT head significant for Periorbital Swelling. Patient's Hgb is 11.2. GI following. On IV abx, 2L O2 acute, not on home O2. Has Hx of ESRD on TTS outpatient HD schedule. Nephrology following for iHD.   CM spoke with patient at bedside about needs for post hospital transition. From home with her son, has two supportive children. Daughter, Anna Carr lives next door and at bedside with patient. Children transports to and from appointments. Has a cane, walker, w/c and bsc at home.  PCP is  Abbe Hoard., MD and uses [facility] in [place].  Home health recommended, patient states she is active with [facility]. CM called in resumption of care referral to [facility] with acceptance voiced, info o AVS.  Patient not Medically ready for discharge.  CM will continue to follow as patient progresses with care towards discharge.              Transition of Care Asessment: Insurance and Status: Insurance coverage has been reviewed Patient has primary care physician: Yes Home environment has been reviewed: Yes Prior level of function:: Independent Prior/Current Home Services: Current home services [facility]) Social Drivers of Health Review: SDOH reviewed no interventions necessary Readmission risk has been reviewed: Yes Transition of care needs: transition of care needs identified, TOC will continue to follow

## 2024-04-11 NOTE — Progress Notes (Signed)
 Seen prior to HD.  L AVF converted to AVG by Dr. Vikki Graves 03/12/2021 Notified by RN that no bruit/thrill L AVG. She HAD T/B in AVG when seen earlier this AM around 10 AM. Now no T/B present. Has been on on Levophed , current BP much lower than usual. Will order Declot in IR. K+ 5.0. Will order Lokelma  10 grams PO now.   Jacobo Masters [facility] [phone]

## 2024-04-11 NOTE — Consult Note (Addendum)
 [facility] Renal Consultation Note    Indication for Consultation:  Management of ESRD/hemodialysis; anemia, hypertension/volume and secondary hyperparathyroidism  Nephrologist: Dr. Higinio Love  HPI: Anna Carr is a 83 y.o. 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 PMH: DMT2, HTN, HFpEF G1DD, ,Anemia, SPHT, AS, cardiac arrest, GIB. Patient had mechanical fall 04/04/2024. She was seen at [facility] for evaluation. She was brought to ED for AMS, weakness.  She has been admitted for sepsis, hypotension. CXR with Mild residual congestive changes and pleural effusions. BC NGTD. SCr 5.5 BUN 56 Co2 24 Lactic acid 3.2 WBC 9.0 HGB 8.7 PLT 149. Currently off levophed  as of this morning, up in room with OT. O2 sats 94% on RA, no WOB but has dependent edema. She has no specific complaints. Face is very bruised from fall, has wound RLE covered in dressing. She denies SOB but says they may pull fluid off my lung with a needle. Pleurocentesis being considered.  We have been asked to manage hemodialysis today..    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 difficile colitis 03/30/2021   Cardiac arrest ([name]) 03/02/2024   Cellulitis 03/16/2021   CHF with left ventricular diastolic dysfunction, NYHA class 1 ([name])  02/02/2017   Cholelithiasis 03/30/2021   Chronic constipation 12/22/2015   Chronic laryngitis 02/17/2017   Last Assessment & Plan:   Formatting of this note might be different from the original.  Evaluation of larynx.  Was hospitalized for pneumonia and during that time she started noticing worsening hoarseness.  Denies any throat pain.  No obvious heartburn.  She did recently start PPI therapy.  She smoked in the past.  Denies any difficulty swallowing.  Hoarseness is improving.  EXAM shows mildly ras   Coagulation defect, unspecified ([name]) 12/07/2019   Colon polyp    Complication of vascular dialysis catheter 03/05/2020   Contact with and (suspected) exposure to covid-19 10/28/2020   COPD mixed type ([name])    Cough, unspecified 10/28/2020   COVID-19 10/29/2020   Depression    Diverticulosis of large intestine without perforation or abscess without bleeding 01/08/2020   Dyspnea on exertion    Dyspnea, unspecified 11/02/2019   Elevated troponin 03/16/2021   Encounter for removal of sutures 12/28/2019   Encounter for screening for respiratory tuberculosis 12/06/2019   ESRD (end stage renal disease) ([name]) 07/18/2019   ESRD on dialysis ([name]) 09/18/2019   ESRD on hemodialysis ([name]) 01/02/2020   Fever, unspecified 01/23/2020   GI bleed 12/19/2021   GIB (gastrointestinal bleeding) 01/02/2020   Heme positive stool    High risk medication use 12/22/2015   History of blood transfusion    History of colonic polyps 01/08/2020   HLD (hyperlipidemia) 12/22/2015   Hypertension    Hypervolemia 11/13/2019   Hypokalemia 03/11/2019   Hyponatremia 03/11/2019   Hypovolemia 10/27/2021  Hypoxia 03/11/2019   Insomnia    Insulin  long-term use ([name]) 12/22/2015   Iron  deficiency anemia, unspecified 12/06/2019   Major depressive disorder, recurrent ([name]) 12/22/2015   Malaise and fatigue 12/23/2015   Mild protein-calorie malnutrition ([name]) 03/26/2020   Mitral regurgitation moderate to severe based on  echocardiogram from 2022 11/03/2021   Musculoskeletal chest pain 03/03/2024   Osteoarthritis    Osteopenia    Other disorders of phosphorus metabolism 01/08/2020   PAC (premature atrial contraction) 10/04/2020   Pain, unspecified 12/06/2019   PAT (paroxysmal atrial tachycardia) ([name]) 10/04/2020   Peripheral vascular disease, unspecified ([name]) critical stenosis lower extremity 12/01/2023   Peripheral arterial disease     Pneumonia    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  Chodri CT annually.     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 dialysis, with long-term current use of insulin  ([name]) 12/23/2015   Type 2 diabetes mellitus, with long-term current use of insulin  ([name]) 03/11/2019   Unspecified hemorrhoids 01/08/2020   Past Surgical History:  Procedure Laterality Date   A/V FISTULAGRAM Left 02/17/2021   Procedure: A/V FISTULAGRAM;  Surgeon: Adine Hoof, MD;  Location: [facility] INVASIVE CV LAB;  Service: Cardiovascular;  Laterality: Left;   APPENDECTOMY     AV FISTULA PLACEMENT Left    AV FISTULA PLACEMENT Left 03/12/2021   Procedure: INSERTION OF LEFT UPPER ARM ARTERIOVENOUS (AV) GORE-TEX GRAFT;  Surgeon: Adine Hoof, MD;  Location: [facility] OR;  Service: Vascular;  Laterality: Left;   BALLOON ENTEROSCOPY  03/02/2024   Procedure: ENTEROSCOPY, USING BALLOON;  Surgeon: Annis Kinder, DO;  Location: [facility] ENDOSCOPY;  Service: Gastroenterology;;   COLONOSCOPY WITH PROPOFOL  N/A 03/18/2021   Procedure: COLONOSCOPY WITH PROPOFOL ;  Surgeon: Albertina Hugger, MD;  Location: [facility] ENDOSCOPY;  Service: Gastroenterology;  Laterality: N/A;   CYSTECTOMY      ENTEROSCOPY N/A 03/21/2021   Procedure: ENTEROSCOPY;  Surgeon: Albertina Hugger, MD;  Location: [facility] ENDOSCOPY;  Service: Gastroenterology;  Laterality: N/A;   ENTEROSCOPY N/A 12/20/2021   Procedure: ENTEROSCOPY;  Surgeon: Albertina Hugger, MD;  Location: [facility] ENDOSCOPY;  Service: Gastroenterology;  Laterality: N/A;   ENTEROSCOPY N/A 02/26/2024   Procedure: ENTEROSCOPY;  Surgeon: Nannette Babe, MD;  Location: [facility] ENDOSCOPY;  Service: Gastroenterology;  Laterality: N/A;   ENTEROSCOPY N/A 02/28/2024   Procedure: ENTEROSCOPY;  Surgeon: Annis Kinder, DO;  Location: [facility] ENDOSCOPY;  Service: Gastroenterology;  Laterality: N/A;   ESOPHAGOGASTRODUODENOSCOPY (EGD) WITH PROPOFOL  N/A 03/18/2021   Procedure: ESOPHAGOGASTRODUODENOSCOPY (EGD) WITH PROPOFOL ;  Surgeon: Albertina Hugger, MD;  Location: [facility] ENDOSCOPY;  Service: Gastroenterology;  Laterality: N/A;   GIVENS CAPSULE STUDY N/A 03/19/2021   Procedure: GIVENS CAPSULE STUDY;  Surgeon: Albertina Hugger, MD;  Location: [facility] ENDOSCOPY;  Service: Gastroenterology;  Laterality: N/A;   GIVENS CAPSULE STUDY N/A 02/29/2024   Procedure: IMAGING PROCEDURE, GI TRACT, INTRALUMINAL, VIA CAPSULE;  Surgeon: Annis Kinder, DO;  Location: [facility] ENDOSCOPY;  Service: Gastroenterology;  Laterality: N/A;   HEMOSTASIS CLIP PLACEMENT  12/20/2021   Procedure: HEMOSTASIS CLIP PLACEMENT;  Surgeon: Albertina Hugger, MD;  Location: [facility] ENDOSCOPY;  Service: Gastroenterology;;   HOT HEMOSTASIS N/A 03/21/2021   Procedure: HOT HEMOSTASIS (ARGON  PLASMA COAGULATION/BICAP);  Surgeon: Albertina Hugger, MD;  Location: [facility] ENDOSCOPY;  Service: Gastroenterology;  Laterality: N/A;   HOT HEMOSTASIS N/A 12/20/2021   Procedure: HOT HEMOSTASIS (ARGON PLASMA COAGULATION/BICAP);  Surgeon: Albertina Hugger, MD;  Location: [facility] ENDOSCOPY;  Service: Gastroenterology;  Laterality: N/A;   HOT HEMOSTASIS N/A 03/02/2024   Procedure: EGD, WITH ARGON PLASMA COAGULATION;  Surgeon: Annis Kinder, DO;   Location: [facility] ENDOSCOPY;  Service: Gastroenterology;  Laterality: N/A;   HYSTEROSCOPY     LOWER EXTREMITY ANGIOGRAPHY N/A 02/03/2024   Procedure: Lower Extremity Angiography;  Surgeon: Avanell Leigh, MD;  Location: [facility] INVASIVE CV LAB;  Service: Cardiovascular;  Laterality: N/A;   POLYPECTOMY  03/18/2021   Procedure: POLYPECTOMY;  Surgeon: Albertina Hugger, MD;  Location: [facility] ENDOSCOPY;  Service: Gastroenterology;;   Family History  Problem Relation Age of Onset   Diabetes Mother    Hypertension Mother    Diabetes Maternal Grandfather    Liver disease Other    Social History:  reports that she has quit smoking. Her smoking use included cigarettes. She has never used smokeless tobacco. She reports that she does not currently use alcohol . She reports that she does not use drugs. Allergies  Allergen Reactions   Atorvastatin  Other (See Comments)    Constipation   Prior to Admission medications   Medication Sig Start Date End Date Taking? Authorizing Provider  acetaminophen  (TYLENOL ) 500 MG tablet Take 500 mg by mouth every 6 (six) hours as needed for mild pain (pain score 1-3) or moderate pain (pain score 4-6).   Yes [provider]  ascorbic acid (VITAMIN C) 1000 MG tablet Take 1,000 mg by mouth at bedtime. 12/07/19  Yes [provider]  aspirin  EC 81 MG tablet Take 1 tablet (81 mg total) by mouth daily. Swallow whole. 12/01/23  Yes Krasowski, Robert J, MD  Biotin 5000 MCG TABS Take 5,000 mcg by mouth daily in the afternoon.   Yes [provider]  carboxymethylcellulose (REFRESH PLUS) 0.5 % SOLN Place 1 drop into both eyes 3 (three) times daily as needed (dry eyes).   Yes [provider]  carvedilol  (COREG ) 6.25 MG tablet Take 6.25 mg by mouth daily. 12/07/19  Yes [provider]  cyanocobalamin (VITAMIN B12) 1000 MCG tablet Take 1,000 mcg by mouth every evening.   Yes [provider]  ferric citrate  (AURYXIA ) 1 GM 210 MG(Fe) tablet Take  210 mg by mouth 3 (three) times daily with meals.   Yes [provider]  fluticasone  (FLONASE ) 50 MCG/ACT nasal spray Place 1 spray into both nostrils daily as needed (Congestion).   Yes [provider]  furosemide  (LASIX ) 80 MG tablet Take 80 mg by mouth daily in the afternoon. 04/03/21  Yes [provider]  ibuprofen (ADVIL) 200 MG tablet Take 200-400 mg by mouth every 6 (six) hours as needed for mild pain (pain score 1-3).   Yes [provider]  Insulin  Glargine-Lixisenatide  (SOLIQUA ) 100-33 UNT-MCG/ML SOPN Inject 6 Units into the skin in the morning. 03/11/24  Yes Zheng, Michael, DO  Lactobacillus (PROBIOTIC ACIDOPHILUS PO) Take 1 capsule by mouth in the morning.   Yes [provider]  lidocaine -prilocaine  (EMLA ) cream Apply 1 application topically as needed (prior to fisula access). Tuesday, Thursday, Saturday for dialysis 07/04/20  Yes [provider]  meloxicam (MOBIC) 7.5 MG tablet Take 7.5-15 mg by mouth daily as needed for pain. 04/05/24  Yes [provider]  Menthol -Methyl Salicylate  (MUSCLE RUB)  10-15 % CREA Apply 1 Application topically 2 (two) times daily. Apply to left shoulder. Patient taking differently: Apply 1 Application topically 2 (two) times daily as needed for muscle pain. Apply to left shoulder. 03/11/24  Yes Jearldine Mina, DO  Omega-3 Fatty Acids (FISH OIL PO) Take 1 capsule by mouth in the morning and at bedtime.   Yes [provider]  pantoprazole  (PROTONIX ) 40 MG tablet Take 1 tablet (40 mg total) by mouth 2 (two) times daily. 03/11/24  Yes Zheng, Michael, DO  polyethylene glycol (MIRALAX  / GLYCOLAX ) 17 g packet Take 17 g by mouth 2 (two) times daily as needed for moderate constipation.   Yes [provider]  Vitamin E 670 MG (1000 UT) CAPS Take 1,000 Units by mouth in the morning. 01/09/20  Yes [provider]  atorvastatin  (LIPITOR) 10 MG tablet Take 1 tablet (10 mg total) by mouth  daily. Patient not taking: Reported on 04/10/2024 12/01/23   Krasowski, Robert J, MD  benzonatate  (TESSALON ) 200 MG capsule Take 1 capsule (200 mg total) by mouth 3 (three) times daily as needed for cough. Patient not taking: Reported on 04/10/2024 03/11/24   Zheng, Michael, DO  cephALEXin (KEFLEX) 500 MG capsule Take 500 mg by mouth 2 (two) times daily. Patient not taking: Reported on 04/10/2024 04/07/24   [provider]  lidocaine  (LIDODERM ) 5 % Place 1 patch onto the skin daily. Remove & Discard patch within 12 hours or as directed by MD Patient not taking: Reported on 04/10/2024 03/11/24   Zheng, Michael, DO   Current Facility-Administered Medications  Medication Dose Route Frequency Provider Last Rate Last Admin   acetaminophen  (TYLENOL ) tablet 650 mg  650 mg Oral Q6H PRN Jackolyn Masker, MD       Or   acetaminophen  (TYLENOL ) suppository 650 mg  650 mg Rectal Q6H PRN Jackolyn Masker, MD       albuterol  (PROVENTIL ) (2.5 MG/3ML) 0.083% nebulizer solution 2.5 mg  2.5 mg Nebulization Q2H PRN Khan, Ghalib, MD   2.5 mg at 04/11/24 0645   atorvastatin  (LIPITOR) tablet 10 mg  10 mg Oral Daily Khan, Ghalib, MD   10 mg at 04/11/24 0903   ceFEPIme (MAXIPIME) 1 g in sodium chloride  0.9 % 100 mL IVPB  1 g Intravenous Q24H Mannam, Praveen, MD   Stopped at 04/10/24 2033   Chlorhexidine  Gluconate Cloth 2 % PADS 6 each  6 each Topical Daily Mannam, Praveen, MD   6 each at 04/10/24 1749   cosyntropin (CORTROSYN) injection 0.25 mg  0.25 mg Intravenous Once Jackolyn Masker, MD       insulin  aspart (novoLOG ) injection 0-6 Units  0-6 Units Subcutaneous TID WC Khan, Ghalib, MD       mupirocin ointment (BACTROBAN) 2 % 1 Application  1 Application Nasal BID Mannam, Praveen, MD   1 Application at 04/11/24 0905   naphazoline-pheniramine (NAPHCON-A) 0.025-0.3 % ophthalmic solution 1 drop  1 drop Right Eye BID Jackolyn Masker, MD   1 drop at 04/11/24 0904   norepinephrine  (LEVOPHED ) 4mg  in (0.016 mg/mL) premix infusion   0-40 mcg/min Intravenous Continuous Mozell Arias, MD 18.75 mL/hr at 04/11/24 0500 5 mcg/min at 04/11/24 0500   ondansetron  (ZOFRAN ) injection 4 mg  4 mg Intravenous Q6H PRN Paliwal, Aditya, MD       Oral care mouth rinse  15 mL Mouth Rinse PRN Mannam, Praveen, MD       pantoprazole  (PROTONIX ) injection 40 mg  40 mg Intravenous Q12H Jackolyn Masker, MD  40 mg at 04/11/24 4401   senna (SENOKOT) tablet 8.6 mg  1 tablet Oral Daily PRN Jackolyn Masker, MD       vancomycin variable dose per unstable renal function (pharmacist dosing)   Does not apply See admin instructions Phyllis Breeze, MD       Labs: Basic Metabolic Panel: Recent Labs  Lab 04/10/24 1106 04/10/24 1112  NA 134* 131*  K 5.1 5.0  CL 93* 94*  CO2 24  --   GLUCOSE 105* 107*  BUN 56* 52*  CREATININE 5.51* 6.30*  CALCIUM  9.2  --    Liver Function Tests: Recent Labs  Lab 04/10/24 1106  AST 26  ALT 22  ALKPHOS 126  BILITOT 1.3*  PROT 5.8*  ALBUMIN  2.6*   No results for input(s): LIPASE, AMYLASE in the last 168 hours. No results for input(s): AMMONIA in the last 168 hours. CBC: Recent Labs  Lab 04/10/24 1106 04/10/24 1112 04/10/24 1520 04/10/24 2306  WBC 9.0  --   --   --   NEUTROABS 8.1*  --   --   --   HGB 8.7* 9.2* 9.1* 11.2*  HCT 28.1* 27.0* 28.9* 36.5  MCV 102.9*  --   --   --   PLT 149*  --   --   --    Cardiac Enzymes: No results for input(s): CKTOTAL, CKMB, CKMBINDEX, TROPONINI in the last 168 hours. CBG: Recent Labs  Lab 04/10/24 1907 04/11/24 0716  GLUCAP 106* 122*   Iron  Studies:  Recent Labs    04/10/24 1520  IRON  28  TIBC 220*  FERRITIN 1,300*   Studies/Results: ECHOCARDIOGRAM LIMITED Result Date: 04/10/2024    ECHOCARDIOGRAM LIMITED REPORT   Patient Name:   Anna Carr Date of Exam: 04/10/2024 Medical Rec #:  [id number]         Height:       61.0 in Accession #:    [id number]        Weight:       162.3 lb Date of Birth:  Apr 30, 1941         BSA:          1.728 m  Patient Age:    83 years          BP:           82/53 mmHg Patient Gender: F                 HR:           68 bpm. Exam Location:  Inpatient Procedure: 2D Echo, Cardiac Doppler and Color Doppler (Both Spectral and Color            Flow Doppler were utilized during procedure). Indications:    Septic Shock  History:        Patient has no prior history of Echocardiogram examinations and                 Patient has prior history of Echocardiogram examinations, most                 recent 03/02/2024. CHF, COPD; Risk Factors:Hypertension and                 Diabetes.  Sonographer:    Sulema Endo RDCS, FE, PE Referring Phys: [id number] PRAVEEN MANNAM IMPRESSIONS  1. Left ventricular ejection fraction, by estimation, is 50 to 55%. The left ventricle has low normal function. There is mild concentric left ventricular hypertrophy. Left ventricular  diastolic parameters are consistent with Grade I diastolic dysfunction (impaired relaxation). Elevated left atrial pressure.  2. Left atrial size was severely dilated.  3. Right atrial size was mildly dilated.  4. Mitral valve area by planimmetry 1.46 cm ^2. The mitral valve is degenerative. Moderate to severe mitral valve regurgitation. Mild mitral stenosis.  5. Tricuspid valve regurgitation is moderate.  6. There is moderate calcification of the aortic valve. There is mild thickening of the aortic valve. Aortic valve sclerosis/calcification is present, without any evidence of aortic stenosis. Aortic valve area, by VTI measures 0.96 cm. Aortic valve mean gradient measures 4.0 mmHg. Aortic valve Vmax measures 1.34 m/s.  7. There is normal pulmonary artery systolic pressure.  8. The inferior vena cava is dilated in size with >50% respiratory variability, suggesting right atrial pressure of 8 mmHg. FINDINGS  Left Ventricle: Left ventricular ejection fraction, by estimation, is 50 to 55%. The left ventricle has low normal function. There is mild concentric left ventricular hypertrophy.  Left ventricular diastolic parameters are consistent with Grade I diastolic dysfunction (impaired relaxation). Elevated left atrial pressure. Right Ventricle: There is normal pulmonary artery systolic pressure. The tricuspid regurgitant velocity is 2.28 m/s, and with an assumed right atrial pressure of 8 mmHg, the estimated right ventricular systolic pressure is 28.8 mmHg. Left Atrium: Left atrial size was severely dilated. Right Atrium: Right atrial size was mildly dilated. Mitral Valve: Mitral valve area by planimmetry 1.46 cm ^2. The mitral valve is degenerative in appearance. Moderate to severe mitral valve regurgitation. Mild mitral valve stenosis. MV peak gradient, 9.6 mmHg. The mean mitral valve gradient is 3.0 mmHg. Tricuspid Valve: Tricuspid valve regurgitation is moderate. Aortic Valve: There is moderate calcification of the aortic valve. There is mild thickening of the aortic valve. Aortic valve sclerosis/calcification is present, without any evidence of aortic stenosis. Aortic valve mean gradient measures 4.0 mmHg. Aortic valve peak gradient measures 7.1 mmHg. Aortic valve area, by VTI measures 0.96 cm. Venous: The inferior vena cava is dilated in size with greater than 50% respiratory variability, suggesting right atrial pressure of 8 mmHg. LEFT VENTRICLE PLAX 2D LVIDd:         4.40 cm   Diastology LVIDs:         3.30 cm   LV e' medial:    5.98 cm/s LV PW:         1.20 cm   LV E/e' medial:  24.2 LV IVS:        1.10 cm   LV e' lateral:   9.03 cm/s LVOT diam:     1.80 cm   LV E/e' lateral: 16.1 LV SV:         31 LV SV Index:   18 LVOT Area:     2.54 cm  RIGHT VENTRICLE RV S prime:     6.31 cm/s TAPSE (M-mode): 0.8 cm LEFT ATRIUM             Index        RIGHT ATRIUM           Index LA Vol (A2C):   96.8 ml 56.01 ml/m  RA Area:     19.30 cm LA Vol (A4C):   83.6 ml 48.38 ml/m  RA Volume:   55.40 ml  32.06 ml/m LA Biplane Vol: 90.4 ml 52.31 ml/m  AORTIC VALVE AV Area (Vmax):    1.03 cm AV Area  (Vmean):   1.06 cm AV Area (VTI):     0.96 cm AV  Vmax:           133.50 cm/s AV Vmean:          91.250 cm/s AV VTI:            0.320 m AV Peak Grad:      7.1 mmHg AV Mean Grad:      4.0 mmHg LVOT Vmax:         54.00 cm/s LVOT Vmean:        37.900 cm/s LVOT VTI:          0.120 m LVOT/AV VTI ratio: 0.38  AORTA Ao Root diam: 2.70 cm MITRAL VALVE                TRICUSPID VALVE MV Area (PHT): 3.37 cm     TR Peak grad:   20.8 mmHg MV Area VTI:   0.77 cm     TR Vmax:        228.00 cm/s MV Peak grad:  9.6 mmHg MV Mean grad:  3.0 mmHg     SHUNTS MV Vmax:       1.55 m/s     Systemic VTI:  0.12 m MV Vmean:      81.2 cm/s    Systemic Diam: 1.80 cm MV Decel Time: 225 msec MV E velocity: 145.00 cm/s MV A velocity: 94.60 cm/s MV E/A ratio:  1.53 Maudine Sos MD Electronically signed by Maudine Sos MD Signature Date/Time: 04/10/2024/11:07:19 PM    Final    CT CHEST ABDOMEN PELVIS WO CONTRAST Result Date: 04/10/2024 CLINICAL DATA:  Recent fall, hypotension. EXAM: CT CHEST, ABDOMEN AND PELVIS WITHOUT CONTRAST TECHNIQUE: Multidetector CT imaging of the chest, abdomen and pelvis was performed following the standard protocol without IV contrast. RADIATION DOSE REDUCTION: This exam was performed according to the departmental dose-optimization program which includes automated exposure control, adjustment of the mA and/or kV according to patient size and/or use of iterative reconstruction technique. COMPARISON:  None Available. FINDINGS: CT CHEST FINDINGS Cardiovascular: No significant vascular findings. Normal heart size. No pericardial effusion. Mediastinum/Nodes: No axillary or supraclavicular adenopathy. No mediastinal or hilar adenopathy. No pericardial fluid. Esophagus normal. Lungs/Pleura: Lobar airspace disease in the LEFT lower lobe small effusion. Large RIGHT pleural effusion. Musculoskeletal: Fracture of the RIGHT second and fourth ribs anterior and laterally appear chronic. Fracture of the anterolateral fifth rib  (image 72/5 appears more acute. Fracture of the anterior RIGHT seventh rib (image 112/series 5 also appears acute CT ABDOMEN AND PELVIS FINDINGS Hepatobiliary: No focal hepatic lesion. Large gallstone. No gallbladder inflammation. Pancreas: Pancreas is normal. No ductal dilatation. No pancreatic inflammation. Spleen: Normal spleen Adrenals/urinary tract: Adrenal glands normal. LEFT kidney small compared to the RIGHT. Ureters and bladder normal. Stomach/Bowel: Stomach, small bowel, appendix, and cecum are normal. Multiple diverticula of the descending colon and sigmoid colon without acute inflammation. Vascular/Lymphatic: Abdominal aorta is normal caliber with atherosclerotic calcification. There is no retroperitoneal or periportal lymphadenopathy. No pelvic lymphadenopathy. Reproductive: Uterus and adnexa unremarkable. Other: Moderate amount free fluid the pelvis. Some fluid along the RIGHT margin liver. Musculoskeletal: No pelvic fracture.  No acute spine fracture. IMPRESSION: CHEST: 1. Lobar airspace disease in the LEFT lower lobe with small effusion. Findings consistent with LEFT lower lobe pneumonia 2. Acute or subacute fractures of the RIGHT fifth and seventh ribs. 3. More remote fractures of the RIGHT second and fourth ribs. 4. Large asymmetric RIGHT pleural effusion.  Etiology unclear. PELVIS: 1. Moderate amount of free fluid in the pelvis and along the  RIGHT margin of the liver. 2. No pelvic fracture. No acute spine fracture. 3. Cholelithiasis without evidence of cholecystitis. 4.  Aortic Atherosclerosis (ICD10-I70.0). Electronically Signed   By: Deboraha Fallow M.D.   On: 04/10/2024 18:59   DG Chest Portable 1 View Result Date: 04/10/2024 CLINICAL DATA:  Hypoxia EXAM: PORTABLE CHEST 1 VIEW COMPARISON:  Mar 09, 2024 FINDINGS: Persistent bilateral pulmonary infiltrates that correlate with congestive changes with persistent bilateral pleural effusions right larger than left left. Basilar atelectasis.  Calcification of the annulus of the mitral valve. Heart upper limits of normal. IMPRESSION: Mild residual congestive changes and pleural effusions Electronically Signed   By: Fredrich Jefferson M.D.   On: 04/10/2024 11:59    ROS: As per HPI otherwise negative.   Physical Exam: Vitals:   04/11/24 0845 04/11/24 0900 04/11/24 0915 04/11/24 0930  BP: 119/79 (!) 122/55 (!) 108/46 95/71  Pulse: 70 71 70 70  Resp: (!) 22 18 (!) 23 (!) 25  Temp:      TempSrc:      SpO2: 97% 98% 95% 98%  Weight:      Height:         General: Chronically ill appearing female in NAD Head: Normocephalic, bruised face, R eye with slight conjunctival hemorrhage  Neck: Supple. JVD not elevated. Lungs: Decreased in bases, few crackles L base. No WOB.  Heart: S1,S2 No M/R/G Abdomen: NABS, NT Lower extremities:Trace-1+ BLE, Wound RLE Foam drsg R knee/R ankle Neuro: Alert and oriented X 3. Moves all extremities spontaneously. Psych:  Responds to questions appropriately with a normal affect. Dialysis Access: L AVG +T/B  Dialysis Orders: Center: [facility] T,Th,S  3.5 hrs 180NRe 400/Autoflow 1.5 70 kg 2.0 K/2.5 Ca - No heparin  - Calcitriol 0.25 mcg PO three times per week - Mircera 225 mcg IV Q 2 weeks (Last dose 03/28/2024 due 04/11/2024)  Assessment/Plan:  Shock: 2/2 PNA Management per PCCM. Off pressors.  R Pleural effusion: PCCM doing thoracentesis at bedside.Yield 1.4 liters Per PCCM.   ESRD -  T,Th,S. HD today on schedule  Hypotension/volume  - BP stable off pressors. BLE edema. She has been leaving above OP EDW. Very much above EDW per weights if accurate. Will attempt to lower EDW as tolerated.   Anemia  - HGB 8.7 Low Tsat. Hold off venofer  until [facility] results. Max ESA dose today.   Metabolic bone disease -  Continue Auryxia  binder, VDRA   Nutrition - Albumin  2.6. FFT, add protein supplements  DMT2- per primary  9.    FFT-OT, PT needed   Armelia Penton H. Bevin Bucks, NP-C 04/11/2024, 9:59 AM  [employer]  [phone]

## 2024-04-11 NOTE — Plan of Care (Signed)

## 2024-04-11 NOTE — Procedures (Addendum)
 Anna Carr  Procedure Note  Anna Carr  [id number]  1941/05/18  Date:04/11/24  Time:10:53 AM   Provider Performing:Terreon Ekholm R Azam Gervasi   Procedure: Anna Carr with imaging guidance ([id number])  Indication(s) Pleural Effusion  Consent Risks of the procedure as well as the alternatives and risks of each were explained to the patient and/or caregiver.  Consent for the procedure was obtained and is signed in the bedside chart  Anesthesia Topical only with 1% lidocaine     Time Out Verified patient identification, verified procedure, site/side was marked, verified correct patient position, special equipment/implants available, medications/allergies/relevant history reviewed, required imaging and test results available.   Sterile Technique Maximal sterile technique including full sterile barrier drape, hand hygiene, sterile gown, sterile gloves, mask, hair covering, sterile ultrasound probe cover (if used).  Procedure Description Ultrasound was used to identify appropriate pleural anatomy for placement and overlying skin marked.  Area of drainage cleaned and draped in sterile fashion. Lidocaine  was used to anesthetize the skin and subcutaneous tissue.  1200 cc's of straw colored clear- appearing fluid was drained from the right pleural space. Catheter then removed and bandaid applied to site.   Complications/Tolerance None; patient tolerated the procedure well. Chest X-ray is ordered to confirm no post-procedural complication.   EBL Minimal   Specimen(s) Pleural fluid        Anna Carr Anna Pitter, PA-C

## 2024-04-11 NOTE — Progress Notes (Signed)
 Bedside ultrasound procedure   Bedside ultrasound performed on the right lung to evaluate pleural fluid and need for thoracentesis Patient positioned appropriately and ultrasound performed showed adequate pocket of fluid for thoracetesis     Phyllis Breeze MD [facility] See Amion for pager  If no response to pager , please call [phone] until 7pm After 7:00 pm call Elink  [phone] 04/11/2024, 1:26 PM

## 2024-04-11 NOTE — Progress Notes (Signed)
 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   Per the son, she was at baseline yesterday and was weak and not following commands this am. They didn't note anything out of the ordinary for this patient. On further hx, pt had a fall 06/10. She has some wounds after the fall and got CTH which was negative. It appears she was started on Keflex for her wound as a precaution. Per pt and son, they have not picked this up yet. Of note, her aspirin  was held during admission but pt reports she restarted this medication.   At baseline pt is independent in ADLs but needs assistance with iADLs. She needs cane to mobilize.     Pertinent  Medical History  Chronic anemia 2/2 to angioectasia ESRD on HD TThSat IDDM HFpEF HLD  Significant Hospital Events: Including procedures, antibiotic start and stop dates in addition to other pertinent events   06/16: admitted to ICU     Interim History / Subjective:  Off pressors overnight, on 2L this AM, c/o some R rib pain  Objective   Blood pressure (!) 106/95, pulse 69, temperature 97.9 F (36.6 C), temperature source Oral, resp. rate (!) 22, height 5' 1 (1.549 m), weight 77 kg, SpO2 100%.        Intake/Output Summary (Last 24 hours) at 04/11/2024 0807 Last data filed at 04/11/2024 0600 Gross per 24 hour  Intake 1897.84 ml  Output 10 ml  Net 1887.84 ml   Filed Weights   04/10/24 1500 04/11/24 0500  Weight: 73.6 kg 77 kg    Examination: General: well nourished elderly F resting in bed in NAD HENT: bruising on right face, nasal canula in place, decreased visual acuity in right eye Lungs: mildly decreased in the  RLL, no rhonchi, wheezing or distress Cardiovascular: sinus rhythm, decreases peripheral pulses Abdomen: TTP of epigastric/umbilical region, good bowel sounds Extremities: scattered ecchymosis, normal muscle tone and bulk Neuro: alert and oriented x4, good strength and sensation GU: no foley catheter   Labs pending this AM  Labs   CBC: Recent Labs  Lab 04/10/24 1106 04/10/24 1112 04/10/24 1520 04/10/24 2306  WBC 9.0  --   --   --   NEUTROABS 8.1*  --   --   --   HGB 8.7* 9.2* 9.1* 11.2*  HCT 28.1* 27.0* 28.9* 36.5  MCV 102.9*  --   --   --   PLT 149*  --   --   --    Basic Metabolic Panel: Recent Labs  Lab 04/10/24 1106 04/10/24 1112  NA 134* 131*  K 5.1 5.0  CL 93* 94*  CO2 24  --   GLUCOSE 105* 107*  BUN 56* 52*  CREATININE 5.51* 6.30*  CALCIUM  9.2  --    GFR: Estimated Creatinine Clearance: 6.5 mL/min (A) (by C-G formula based on SCr of 6.3 mg/dL (H)). Recent Labs  Lab 04/10/24 1106 04/10/24 1338 04/10/24 1520 04/10/24 1631 04/10/24 2026  PROCALCITON  --   --  10.24  --   --   WBC 9.0  --   --   --   --  LATICACIDVEN  --  3.2*  --  3.7* 6.3*   Liver Function Tests: Recent Labs  Lab 04/10/24 1106  AST 26  ALT 22  ALKPHOS 126  BILITOT 1.3*  PROT 5.8*  ALBUMIN  2.6*   No results for input(s): LIPASE, AMYLASE in the last 168 hours. No results for input(s): AMMONIA in the last 168 hours. ABG    Component Value Date/Time   PHART 7.315 (L) 03/02/2024 1223   PCO2ART 44.1 03/02/2024 1223   PO2ART 395 (H) 03/02/2024 1223   HCO3 22.5 03/02/2024 1223   TCO2 26 04/10/2024 1112   ACIDBASEDEF 3.0 (H) 03/02/2024 1223   O2SAT 100 03/02/2024 1223    Coagulation Profile: Recent Labs  Lab 04/10/24 1106  INR 1.3*   Cardiac Enzymes: No results for input(s): CKTOTAL, CKMB, CKMBINDEX, TROPONINI in the last 168 hours. HbA1C: Hgb A1c MFr Bld  Date/Time Value Ref Range Status  02/25/2024 03:00 PM 5.6 4.8 - 5.6 % Final    Comment:     (NOTE) Pre diabetes:          5.7%-6.4%  Diabetes:              >6.4%  Glycemic control for   <7.0% adults with diabetes   03/16/2021 12:33 AM 6.9 (H) 4.8 - 5.6 % Final    Comment:    (NOTE) Pre diabetes:          5.7%-6.4%  Diabetes:              >6.4%  Glycemic control for   <7.0% adults with diabetes    CBG: Recent Labs  Lab 04/10/24 1907 04/11/24 0716  GLUCAP 106* 122*    Consults  GI consult Nephrology Assessment & Plan:  Principal Problem:   Shock, unspecified ([name]) Active Problems:   Anemia in chronic kidney disease   CHF with left ventricular diastolic dysfunction, NYHA class 1 ([name])   Hyponatremia   ESRD on hemodialysis ([name])   Hypertension   AVM (arteriovenous malformation)   Type 2 diabetes mellitus, with long-term current use of insulin  ([name])  Shock, likely septic secondary to LLL pneumonia  R pleural effusion Acute hypoxic respiratory failure Chronic anemia with baseline hemoglobin around 9 Hx of GI bleed, angioectasia  CT chest abdomen and pelvis with LLL infiltrate, R-sided rib fractures and large R pleural effusion -no further evidence of GIB -off pressors -repeat labs pending to trend lactic acid -Follow up H/H q4hrs. Transfuse as needed.  -IV PPI BID. Gi consult. Appreciate their input.  -Continue broad spectrum abx for now, await culture results  -thoracentesis completed with 1.2L out, send fluid studies, if remains off pressors through dialysis is stable for transfer out of the ICU   ESRD on HD -HD on TThSat.   -nephrology consulted    Right lower extremity wounds: -Secondary to fall. Started on keflex by PCP but didn't start. Pt does have some green drainage from the wound but overall wound is small in size. Wound care consulted.   Blurry vision Right eye with blurry vision. Per patient this has been the case since her fall. No pain, or drainage from the eye. Has vision in all fields but states it is blurry. Eye was shut on my  initial exam which appears to be the case when she followed with PCP few days ago  -may need outpatient ophthalmology consult   Elevated bilirubin: - Mild. Continue to trend.  Insulin -dependent diabetes mellitus -Patient currently n.p.o. and will find etiology of her  hypotension.  Will start SSI.  Hyperlipidemia/CAD - LDL 82 in 03/25.  Continue statin  Chronic hyponatremia -Improved.  At 134.  CTM  HTN/HFpEF -Chronic. Holding home coreg  and lasix . -echo with preserved EF, continued atrial dilation and diastolic failure     Best Practice (right click and Reselect all SmartList Selections daily)  Diet/type: NPO DVT prophylaxis: SCD GI prophylaxis: PPI Lines: PIV Foley:  N/A Code Status:  full code Last date of multidisciplinary goals of care discussion [patient and daughter updated 6/17  Home Medications  Prior to Admission medications   Medication Sig Start Date End Date Taking? Authorizing Provider  aspirin  EC 81 MG tablet Take 1 tablet (81 mg total) by mouth daily. Swallow whole. 12/01/23  Yes Krasowski, Robert J, MD  Biotin 5000 MCG TABS Take 5,000 mcg by mouth daily in the afternoon.   Yes [provider]  carvedilol  (COREG ) 6.25 MG tablet Take 6.25 mg by mouth daily. 12/07/19  Yes [provider]  ferric citrate  (AURYXIA ) 1 GM 210 MG(Fe) tablet Take 210 mg by mouth 3 (three) times daily with meals.   Yes [provider]  furosemide  (LASIX ) 80 MG tablet Take 80 mg by mouth daily in the afternoon. 04/03/21  Yes [provider]  Insulin  Glargine-Lixisenatide  (SOLIQUA ) 100-33 UNT-MCG/ML SOPN Inject 6 Units into the skin in the morning. 03/11/24  Yes Jearldine Mina, DO  Omega-3 Fatty Acids (FISH OIL PO) Take 1 capsule by mouth in the morning and at bedtime.   Yes [provider]  pantoprazole  (PROTONIX ) 40 MG tablet Take 1 tablet (40 mg total) by mouth 2 (two) times daily. 03/11/24  Yes Zheng, Michael, DO  acetaminophen  (TYLENOL ) 500 MG  tablet Take 500 mg by mouth every 6 (six) hours as needed for mild pain (pain score 1-3) or moderate pain (pain score 4-6).    [provider]  ascorbic acid (VITAMIN C) 1000 MG tablet Take 1,000 mg by mouth daily in the afternoon. 12/07/19   [provider]  atorvastatin  (LIPITOR) 10 MG tablet Take 1 tablet (10 mg total) by mouth daily. Patient not taking: Reported on 04/10/2024 12/01/23   Krasowski, Robert J, MD  benzonatate  (TESSALON ) 200 MG capsule Take 1 capsule (200 mg total) by mouth 3 (three) times daily as needed for cough. 03/11/24   Zheng, Michael, DO  carboxymethylcellulose (REFRESH PLUS) 0.5 % SOLN Place 1 drop into both eyes 3 (three) times daily as needed (dry eyes).    [provider]  cyanocobalamin (VITAMIN B12) 1000 MCG tablet Take 1,000 mcg by mouth every evening.    [provider]  fluticasone  (FLONASE ) 50 MCG/ACT nasal spray Place 1 spray into both nostrils daily as needed (Congestion).    [provider]  Lactobacillus (PROBIOTIC ACIDOPHILUS PO) Take 1 capsule by mouth in the morning.    [provider]  lidocaine  (LIDODERM ) 5 % Place 1 patch onto the skin daily. Remove & Discard patch within 12 hours or as directed by MD 03/11/24   Zheng, Michael, DO  lidocaine -prilocaine  (EMLA ) cream Apply 1 application topically as needed (prior to fisula access). Tuesday, Thursday, Saturday for dialysis 07/04/20   [provider]  Menthol -Methyl Salicylate  (MUSCLE RUB) 10-15 % CREA Apply 1 Application topically 2 (two) times daily. Apply to left shoulder. 03/11/24   Zheng, Michael, DO  polyethylene glycol (MIRALAX  / GLYCOLAX ) 17 g packet Take 17 g by mouth daily as needed for moderate constipation.    [provider]  Vitamin E  670 MG (1000 UT) CAPS Take 1,000 Units by mouth in the morning. 01/09/20   [provider]    Allergies Allergies  Allergen Reactions   Atorvastatin  Other (See Comments)    Constipation       [name] Babbie Dondlinger, PA-C [facility] See Amion for pager If no response to pager , please call [phone] until 7pm After 7:00 pm call Elink  [phone]

## 2024-04-11 NOTE — Progress Notes (Signed)
 Discussed with Dr. Fulton Job. Pt ready for floor transfer. Discharged from [facility] last month. [facility] will assume care on 6/18 at 7am. Carleen Chary, DO [facility] PGY1

## 2024-04-11 NOTE — Hospital Course (Addendum)
 CAP AV Fistula Clot The patient was admitted with weakness and encephalopathy, suspected to be secondary to a gastrointestinal (GI) bleed. She had a prior hospitalization in May for acute on chronic anemia, during which both an EGD and capsule endoscopy were performed without identifying a clear source of bleeding. A third endoscopy on May 8 revealed non-bleeding arteriovenous malformations (AVMs) in the duodenum, which were treated with argon plasma coagulation. The procedure was complicated by a bradycardic arrest; however, return of spontaneous circulation (ROSC) was achieved promptly after brief CPR.  During the current admission, a chest X-ray revealed pneumonia, and she was started on antibiotics. She subsequently developed hypotension, necessitating a brief transfer to the ICU for vasopressor support. After stabilization, she was transferred back to the medical floor, where antibiotics were continued. At discharge, her antibiotic regimen was de-escalated to Augmentin and Doxycycline.Lasix  was paused on admission due to hypotension. Please review her vitals and volume status and resume when appropriate.    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 Post-procedure, the fistula was reassessed and used for dialysis. Nephrology followed her closely throughout her stay, and she received dialysis as clinically indicated.  Additionally, there were concerns for intermittent nocturnal hypoxia, for which she required 2-3 liters of supplemental oxygen at night. She desat to 70- 80s during on ambulatory O2.She was sent home on portable oxygen.   Her ambulatory O2 stats were: SATURATION QUALIFICATIONS:    Patient Saturations on Room Air at Rest = 96%   Patient Saturations on Room Air while Ambulating = 76%   Patient Saturations on 0 Liters of oxygen while Ambulating = 76%   After seated rest on room air per pt request,  O2 sat increased to 94%.

## 2024-04-11 NOTE — Progress Notes (Signed)
   04/11/24 1415  Vitals  Temp 97.8 F (36.6 C)  Pulse Rate 71  Resp 19  BP (!) 99/47  SpO2 100 %   PT ARRIVED TO UNIT UPON ASSESSMENT LAVG -/- NP IN UNIT TO BS FOR EVALUATION AGREED NO BLOOD FLOW NOTED TO ACCESS CONSULTING VVS FOR INTERVENTIONS PT INFORMED

## 2024-04-11 NOTE — Evaluation (Signed)
 Occupational Therapy Evaluation Patient Details Name: Anna Carr MRN: [id number] DOB: 17-Apr-1941 Today's Date: 04/11/2024   History of Present Illness   Pt is an 83 y/o F presenting to ED on 6/16 wtih possible GIB after fall, dark stools, admittedf or AMS and shock. Recently here 1 month ago for similar. PMH includes ESRD on HD, DM2, nonrheumatic mitral valve regurgitation, HFpEF, advanced PVD, anemia of chronic disease, duodenal/jejunal angioectasais, small bowel enteroscopy on 5/3 complicated by bradycardia, VCE then repeat enteroscopy on 5/8 which complicated by hypoxia and bradycardia and asystole requriing CPR with rapid ROSC.     Clinical Impressions Pt lives with family, was using RW for mobility and w/c outside the home, manages her own meds and family provides set up A for ADLs. Pt currently needing up to max A for ADLs, min A for bed mobility and min +2 for transfers with RW. Pt SpO2 in 90s on RA with sitting EOB and during transfers, decr to 86% on RA once semi reclined in bed. 2L O2 donned at end of session. Pt presenting with impairments listed below, will follow acutely. Recommend HHOT at d/c.      If plan is discharge home, recommend the following:   A little help with walking and/or transfers;A little help with bathing/dressing/bathroom;Assistance with cooking/housework;Direct supervision/assist for medications management;Direct supervision/assist for financial management;Assist for transportation;Help with stairs or ramp for entrance     Functional Status Assessment   Patient has had a recent decline in their functional status and demonstrates the ability to make significant improvements in function in a reasonable and predictable amount of time.     Equipment Recommendations   Hospital bed (with air mattress overlay)     Recommendations for Other Services   PT consult     Precautions/Restrictions   Precautions Precautions: Fall Restrictions Weight  Bearing Restrictions Per Provider Order: No     Mobility Bed Mobility Overal bed mobility: Needs Assistance Bed Mobility: Supine to Sit, Sit to Supine     Supine to sit: Min assist Sit to supine: Min assist        Transfers Overall transfer level: Needs assistance Equipment used: Rolling walker (2 wheels) Transfers: Sit to/from Stand, Bed to chair/wheelchair/BSC Sit to Stand: Min assist, +2 safety/equipment Stand pivot transfers: Min assist, +2 physical assistance, +2 safety/equipment                Balance Overall balance assessment: Needs assistance Sitting-balance support: Feet supported Sitting balance-Leahy Scale: Fair     Standing balance support: During functional activity, Reliant on assistive device for balance Standing balance-Leahy Scale: Poor Standing balance comment: RW in standing                           ADL either performed or assessed with clinical judgement   ADL Overall ADL's : Needs assistance/impaired Eating/Feeding: Set up;Sitting   Grooming: Minimal assistance;Sitting;Standing   Upper Body Bathing: Minimal assistance;Sitting   Lower Body Bathing: Maximal assistance;Bed level   Upper Body Dressing : Minimal assistance;Standing   Lower Body Dressing: Maximal assistance;Bed level   Toilet Transfer: Minimal assistance;+2 for safety/equipment   Toileting- Clothing Manipulation and Hygiene: Maximal assistance       Functional mobility during ADLs: Minimal assistance;+2 for physical assistance       Vision   Additional Comments: reports blurred vision in R eye since fall     Perception Perception: Not tested       Praxis  Praxis: Not tested       Pertinent Vitals/Pain Pain Assessment Pain Assessment: No/denies pain     Extremity/Trunk Assessment Upper Extremity Assessment Upper Extremity Assessment: Generalized weakness   Lower Extremity Assessment Lower Extremity Assessment: Defer to PT evaluation    Cervical / Trunk Assessment Cervical / Trunk Assessment: Kyphotic   Communication Communication Communication: Impaired Factors Affecting Communication: Hearing impaired   Cognition Arousal: Alert Behavior During Therapy: WFL for tasks assessed/performed                                 Following commands: Intact       Cueing  General Comments   Cueing Techniques: Verbal cues  SpO2 down to 86% on RA with supine position, improved to low 90s sitting EOB and standing. 2L replaced at end of session   Exercises     Shoulder Instructions      Home Living Family/patient expects to be discharged to:: Private residence Living Arrangements: Children (with son and daughter lives next door) Available Help at Discharge: Family;Available 24 hours/day Type of Home: House Home Access: Ramped entrance     Home Layout: One level     Bathroom Shower/Tub: Tub/shower unit;Walk-in shower   Bathroom Toilet: Standard Bathroom Accessibility: No   Home Equipment: Agricultural consultant (2 wheels);Grab bars - tub/shower;Wheelchair - manual;Rollator (4 wheels);BSC/3in1;Cane - single point;Shower seat          Prior Functioning/Environment Prior Level of Function : Needs assist;History of Falls (last six months)             Mobility Comments: RW in the home, w/c to HD, RW does not fit into bathroom, holds onto walls in bathroom ADLs Comments: manages own meds, set up A for ADLs    OT Problem List: Decreased strength;Decreased range of motion;Decreased activity tolerance;Impaired balance (sitting and/or standing);Decreased safety awareness;Decreased cognition;Cardiopulmonary status limiting activity   OT Treatment/Interventions: Self-care/ADL training;Therapeutic exercise;Energy conservation;DME and/or AE instruction;Therapeutic activities;Patient/family education;Balance training      OT Goals(Current goals can be found in the care plan section)   Acute Rehab OT  Goals Patient Stated Goal: none stated OT Goal Formulation: With patient Time For Goal Achievement: 04/25/24 Potential to Achieve Goals: Good ADL Goals Pt Will Perform Upper Body Dressing: with supervision;sitting Pt Will Perform Lower Body Dressing: with min assist;sitting/lateral leans;sit to/from stand Pt Will Transfer to Toilet: with min assist;ambulating;regular height toilet Additional ADL Goal #1: pt will perform bed mobility with supervision in prep for ADLs   OT Frequency:  Min 2X/week    Co-evaluation              AM-PAC OT 6 Clicks Daily Activity     Outcome Measure Help from another person eating meals?: A Little Help from another person taking care of personal grooming?: A Little Help from another person toileting, which includes using toliet, bedpan, or urinal?: A Lot Help from another person bathing (including washing, rinsing, drying)?: A Lot Help from another person to put on and taking off regular upper body clothing?: A Little Help from another person to put on and taking off regular lower body clothing?: A Lot 6 Click Score: 15   End of Session Equipment Utilized During Treatment: Gait belt;Rolling walker (2 wheels);Oxygen Nurse Communication: Mobility status  Activity Tolerance: Patient tolerated treatment well Patient left: in bed;with call bell/phone within reach;with bed alarm set;with family/visitor present  OT Visit Diagnosis: Unsteadiness on feet (R26.81);Other  abnormalities of gait and mobility (R26.89);Muscle weakness (generalized) (M62.81)                Time: 2536-6440 OT Time Calculation (min): 34 min Charges:  OT General Charges $OT Visit: 1 Visit OT Evaluation $OT Eval Moderate Complexity: 1 Mod  Sollie Vultaggio K, OTD, OTR/L [employer] Preferred Acute Rehab [phone]   Lataunya Ruud K Koonce 04/11/2024, 10:50 AM

## 2024-04-11 NOTE — Progress Notes (Signed)
 Initial Nutrition Assessment  DOCUMENTATION CODES:  Not applicable  INTERVENTION:  Liberalize to regular diet in setting of advanced age and increased nutrition needs Ensure Plus High Protein po BID, each supplement provides 350 kcal and 20 grams of protein.  NUTRITION DIAGNOSIS:  Increased nutrient needs related to acute illness as evidenced by estimated needs.  GOAL:  Patient will meet greater than or equal to 90% of their needs  MONITOR:  PO intake, Supplement acceptance, Labs, Weight trends, Diet advancement, I & O's, Skin  REASON FOR ASSESSMENT:  Consult Assessment of nutrition requirement/status  ASSESSMENT:  Pt admitted with hypotension, found to have sepsis with unknown etiology. 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  GI consulted for dark stools and anemia.  No evidence of current active GI bleed. Hgb stable on admission.  CT chest abdomen pelvis- LLL infiltrate, R-sided rib fractures and large R pleural effusion, no further evidence of GIB  Septic shock likely secondary to LLL PNA.  R pleural effusion s/p thoracentesis yield 1.2L   Plans for HD today and transfer out of ICU if stable without pressors.    Pt endorses having a good appetite. She ate oatmeal and eggs for breakfast this morning with coffee.  Pt states that prior to admission in May, she was cooking for herself and independent with ADLs. Since that admission she is more reliant on her son and daughter for assistance. She lives with her son and her daughter lives next door. Her son prepares breakfast for her and her daughter cooks lunch and dinner.  She eats 3 meals per day and on dialysis days, she eats 2 meals. Pt does not consume protein supplements. Her daily beverage intake include diet mountain dew, water  and coffee.   She wears dentures which she does not currently have with her. Pt feels that she is able to select items she can chew without difficulty.   She  does take a phosphorus binder at home but cannot recall which one. She mentions that her last known phosphorus lab was within normal limits.    Going for HD today.  EDW: 70 Pt notably has been leaving HD above OP EDW.  Medications:  auryxia , SSI 0-6 units TID, IV protonix  Drips: Abx Levo stopped this morning  Labs:  Sodium 131 BUN 52 Cr 6.30 Anion gap 17 Ionized calcium  1.07 GFR 7 Lactic acid 6.3 CBG's 122, 106 x12 hours HgbA1c 5.6% (02/25/24)  NUTRITION - FOCUSED PHYSICAL EXAM: Flowsheet Row Most Recent Value  Orbital Region No depletion  Upper Arm Region No depletion  Thoracic and Lumbar Region No depletion  Buccal Region No depletion  Temple Region No depletion  Clavicle Bone Region No depletion  Clavicle and Acromion Bone Region No depletion  Scapular Bone Region Mild depletion  Dorsal Hand Mild depletion  Patellar Region No depletion  Anterior Thigh Region No depletion  Posterior Calf Region Mild depletion  Edema (RD Assessment) Mild  [non-pitting generalized edema]  Hair Reviewed  Eyes Reviewed  Mouth Reviewed  Skin Reviewed  Nails Reviewed    Diet Order:   Diet Order             Diet renal with fluid restriction Fluid restriction: 2000 mL Fluid; Room service appropriate? Yes; Fluid consistency: Thin  Diet effective now                   EDUCATION NEEDS:   Education needs have been addressed  Skin:  Skin Assessment:  Skin Integrity Issues: Skin Integrity Issues:: Other (Comment) Other: R leg wound  Last BM:  6/17 x2 (type 6 medium, type 7 large)  Height:   Ht Readings from Last 1 Encounters:  04/10/24 5' 1 (1.549 m)    Weight:   Wt Readings from Last 1 Encounters:  04/11/24 77 kg   BMI:  Body mass index is 32.07 kg/m.  Estimated Nutritional Needs:   Kcal:  1500-1700  Protein:  85-95g  Fluid:  1L + UOP  Rocklin Chute, RDN, LDN Clinical Nutrition See AMiON for contact information.

## 2024-04-12 ENCOUNTER — Inpatient Hospital Stay (HOSPITAL_COMMUNITY)

## 2024-04-12 DIAGNOSIS — N186 End stage renal disease: Secondary | ICD-10-CM

## 2024-04-12 DIAGNOSIS — K922 Gastrointestinal hemorrhage, unspecified: Secondary | ICD-10-CM | POA: Diagnosis not present

## 2024-04-12 DIAGNOSIS — Z992 Dependence on renal dialysis: Secondary | ICD-10-CM

## 2024-04-12 DIAGNOSIS — R579 Shock, unspecified: Secondary | ICD-10-CM | POA: Diagnosis not present

## 2024-04-12 DIAGNOSIS — E440 Moderate protein-calorie malnutrition: Secondary | ICD-10-CM | POA: Diagnosis not present

## 2024-04-12 DIAGNOSIS — L8990 Pressure ulcer of unspecified site, unspecified stage: Secondary | ICD-10-CM | POA: Insufficient documentation

## 2024-04-12 HISTORY — PX: IR THROMBECTOMY AV FISTULA W/THROMBOLYSIS/PTA INC/SHUNT/IMG LEFT: IMG6106

## 2024-04-12 HISTORY — PX: IR US GUIDE VASC ACCESS LEFT: IMG2389

## 2024-04-12 LAB — RENAL FUNCTION PANEL
Albumin: 2.8 g/dL — ABNORMAL LOW (ref 3.5–5.0)
Anion gap: 19 — ABNORMAL HIGH (ref 5–15)
BUN: 85 mg/dL — ABNORMAL HIGH (ref 8–23)
CO2: 19 mmol/L — ABNORMAL LOW (ref 22–32)
Calcium: 9.2 mg/dL (ref 8.9–10.3)
Chloride: 87 mmol/L — ABNORMAL LOW (ref 98–111)
Creatinine, Ser: 6.79 mg/dL — ABNORMAL HIGH (ref 0.44–1.00)
GFR, Estimated: 6 mL/min — ABNORMAL LOW (ref >60)
Glucose, Bld: 191 mg/dL — ABNORMAL HIGH (ref 70–99)
Phosphorus: 5.4 mg/dL — ABNORMAL HIGH (ref 2.5–4.6)
Potassium: 5.2 mmol/L — ABNORMAL HIGH (ref 3.5–5.1)
Sodium: 125 mmol/L — ABNORMAL LOW (ref 135–145)

## 2024-04-12 LAB — CBC WITH DIFFERENTIAL/PLATELET
Abs Immature Granulocytes: 0.03 10*3/uL (ref 0.00–0.07)
Basophils Absolute: 0.1 10*3/uL (ref 0.0–0.1)
Basophils Relative: 1 %
Eosinophils Absolute: 0.1 10*3/uL (ref 0.0–0.5)
Eosinophils Relative: 1 %
HCT: 35.4 % — ABNORMAL LOW (ref 36.0–46.0)
Hemoglobin: 11.5 g/dL — ABNORMAL LOW (ref 12.0–15.0)
Immature Granulocytes: 0 %
Lymphocytes Relative: 10 %
Lymphs Abs: 0.7 10*3/uL (ref 0.7–4.0)
MCH: 31.3 pg (ref 26.0–34.0)
MCHC: 32.5 g/dL (ref 30.0–36.0)
MCV: 96.2 fL (ref 80.0–100.0)
Monocytes Absolute: 0.3 10*3/uL (ref 0.1–1.0)
Monocytes Relative: 5 %
Neutro Abs: 6.4 10*3/uL (ref 1.7–7.7)
Neutrophils Relative %: 83 %
Platelets: 156 10*3/uL (ref 150–400)
RBC: 3.68 MIL/uL — ABNORMAL LOW (ref 3.87–5.11)
RDW: 17.9 % — ABNORMAL HIGH (ref 11.5–15.5)
WBC: 7.6 10*3/uL (ref 4.0–10.5)
nRBC: 0 % (ref 0.0–0.2)

## 2024-04-12 LAB — CBC
HCT: 33.6 % — ABNORMAL LOW (ref 36.0–46.0)
Hemoglobin: 10.7 g/dL — ABNORMAL LOW (ref 12.0–15.0)
MCH: 30.7 pg (ref 26.0–34.0)
MCHC: 31.8 g/dL (ref 30.0–36.0)
MCV: 96.6 fL (ref 80.0–100.0)
Platelets: 147 10*3/uL — ABNORMAL LOW (ref 150–400)
RBC: 3.48 MIL/uL — ABNORMAL LOW (ref 3.87–5.11)
RDW: 18.1 % — ABNORMAL HIGH (ref 11.5–15.5)
WBC: 8.8 10*3/uL (ref 4.0–10.5)
nRBC: 0 % (ref 0.0–0.2)

## 2024-04-12 LAB — COMPREHENSIVE METABOLIC PANEL WITH GFR
ALT: 23 U/L (ref 0–44)
AST: 21 U/L (ref 15–41)
Albumin: 2.5 g/dL — ABNORMAL LOW (ref 3.5–5.0)
Alkaline Phosphatase: 124 U/L (ref 38–126)
Anion gap: 18 — ABNORMAL HIGH (ref 5–15)
BUN: 77 mg/dL — ABNORMAL HIGH (ref 8–23)
CO2: 19 mmol/L — ABNORMAL LOW (ref 22–32)
Calcium: 8.6 mg/dL — ABNORMAL LOW (ref 8.9–10.3)
Chloride: 89 mmol/L — ABNORMAL LOW (ref 98–111)
Creatinine, Ser: 6.42 mg/dL — ABNORMAL HIGH (ref 0.44–1.00)
GFR, Estimated: 6 mL/min — ABNORMAL LOW (ref >60)
Glucose, Bld: 184 mg/dL — ABNORMAL HIGH (ref 70–99)
Potassium: 4.9 mmol/L (ref 3.5–5.1)
Sodium: 126 mmol/L — ABNORMAL LOW (ref 135–145)
Total Bilirubin: 1.2 mg/dL (ref 0.0–1.2)
Total Protein: 5.5 g/dL — ABNORMAL LOW (ref 6.5–8.1)

## 2024-04-12 LAB — GLUCOSE, CAPILLARY
Glucose-Capillary: 173 mg/dL — ABNORMAL HIGH (ref 70–99)
Glucose-Capillary: 150 mg/dL — ABNORMAL HIGH (ref 70–99)
Glucose-Capillary: 152 mg/dL — ABNORMAL HIGH (ref 70–99)
Glucose-Capillary: 189 mg/dL — ABNORMAL HIGH (ref 70–99)

## 2024-04-12 MED ORDER — MIDAZOLAM HCL 2 MG/2ML IJ SOLN
INTRAMUSCULAR | Status: AC | PRN
  Administered 2024-04-12: 1 mg via INTRAVENOUS
  Administered 2024-04-12: .5 mg via INTRAVENOUS

## 2024-04-12 MED ORDER — ALTEPLASE 2 MG IJ SOLR
INTRAMUSCULAR | Status: AC
Start: 2024-04-12 — End: 2024-04-12
  Filled 2024-04-12: qty 4

## 2024-04-12 MED ORDER — AZITHROMYCIN 250 MG PO TABS: 500.0000 mg | ORAL_TABLET | Freq: Every day | ORAL | Status: DC

## 2024-04-12 MED ORDER — LIDOCAINE-EPINEPHRINE 1 %-1:100000 IJ SOLN
INTRAMUSCULAR | Status: AC
  Filled 2024-04-12: qty 1

## 2024-04-12 MED ORDER — HEPARIN SODIUM (PORCINE) 1000 UNIT/ML IJ SOLN
INTRAMUSCULAR | Status: AC
  Filled 2024-04-12: qty 10

## 2024-04-12 MED ORDER — IOHEXOL 300 MG/ML  SOLN
100.0000 mL | Freq: Once | INTRAMUSCULAR | Status: AC | PRN
  Administered 2024-04-12: 56 mL via INTRAVENOUS

## 2024-04-12 MED ORDER — ALTEPLASE 2 MG IJ SOLR
INTRAMUSCULAR | Status: AC | PRN
  Administered 2024-04-12 (×2): 2 mg

## 2024-04-12 MED ORDER — SODIUM CHLORIDE 0.9 % IV SOLN: 2.0000 g | INTRAVENOUS | Status: DC

## 2024-04-12 MED ORDER — DOXYCYCLINE HYCLATE 100 MG PO TABS
100.0000 mg | ORAL_TABLET | Freq: Two times a day (BID) | ORAL | Status: DC
  Administered 2024-04-12 – 2024-04-14 (×2): 100 mg via ORAL
  Filled 2024-04-12 (×3): qty 1

## 2024-04-12 MED ORDER — FENTANYL CITRATE (PF) 100 MCG/2ML IJ SOLN
INTRAMUSCULAR | Status: AC
  Filled 2024-04-12: qty 2

## 2024-04-12 MED ORDER — MIDAZOLAM HCL 2 MG/2ML IJ SOLN
INTRAMUSCULAR | Status: AC
  Filled 2024-04-12: qty 2

## 2024-04-12 MED ORDER — LIDOCAINE-EPINEPHRINE 1 %-1:100000 IJ SOLN
20.0000 mL | Freq: Once | INTRAMUSCULAR | Status: AC
  Administered 2024-04-12: 10 mL via INTRADERMAL

## 2024-04-12 MED ORDER — PANTOPRAZOLE SODIUM 40 MG IV SOLR
40.0000 mg | INTRAVENOUS | Status: DC
  Administered 2024-04-14: 40 mg via INTRAVENOUS
  Filled 2024-04-12 (×2): qty 10

## 2024-04-12 MED ORDER — HEPARIN SODIUM (PORCINE) 1000 UNIT/ML IJ SOLN
INTRAMUSCULAR | Status: AC | PRN
  Administered 2024-04-12: 5000 [IU] via INTRAVENOUS

## 2024-04-12 MED ORDER — FENTANYL CITRATE (PF) 100 MCG/2ML IJ SOLN
INTRAMUSCULAR | Status: AC | PRN
  Administered 2024-04-12 (×2): 25 ug via INTRAVENOUS

## 2024-04-12 NOTE — Progress Notes (Signed)
 Pt receives out-pt HD at [facility] on TTS 11:00 am chair time. Will assist as needed.   Lauraine Polite Renal Navigator [phone]

## 2024-04-12 NOTE — Plan of Care (Signed)
  Problem: Education: Goal: Knowledge of General Education information will improve Description: Including pain rating scale, medication(s)/side effects and non-pharmacologic comfort measures Outcome: Progressing   Problem: Health Behavior/Discharge Planning: Goal: Ability to manage health-related needs will improve Outcome: Progressing   Problem: Clinical Measurements: Goal: Ability to maintain clinical measurements within normal limits will improve Outcome: Progressing   Problem: Activity: Goal: Risk for activity intolerance will decrease Outcome: Progressing   Problem: Nutrition: Goal: Adequate nutrition will be maintained Outcome: Progressing   Problem: Coping: Goal: Level of anxiety will decrease Outcome: Progressing   Problem: Pain Managment: Goal: General experience of comfort will improve and/or be controlled Outcome: Progressing   Problem: Safety: Goal: Ability to remain free from injury will improve Outcome: Progressing

## 2024-04-12 NOTE — Progress Notes (Signed)
 Mobility Specialist Progress Note;   04/12/24 1129  Mobility  Activity Ambulated with assistance in hallway;Ambulated with assistance in room  Level of Assistance Contact guard assist, steadying assist  Assistive Device Front wheel walker  Distance Ambulated (ft) 40 ft  Activity Response Tolerated well  Mobility Referral Yes  Mobility visit 1 Mobility  Mobility Specialist Start Time (ACUTE ONLY) 1129  Mobility Specialist Stop Time (ACUTE ONLY) 1136  Mobility Specialist Time Calculation (min) (ACUTE ONLY) 7 min   Pt in chair upon arrival, agreeable to mobility. Required MinG assistance during ambulation for safety. VSS throughout. C/o legs feeling fatigued, otherwise asx. Pt returned back to chair with all needs met, alarm on. Family present.   Janit Meline Mobility Specialist Please contact via SecureChat or [employer] [phone]

## 2024-04-12 NOTE — Progress Notes (Signed)
  [facility] Progress Note   Subjective: Seen in room. Slightly SOB but O2 sats 100% no WOB. Going for declot, hopefully AVG salvage today in IR then HD.     Objective Vitals:   04/12/24 0100 04/12/24 0453 04/12/24 0500 04/12/24 0718  BP: (!) 127/57 132/64  90/66  Pulse: 81 85  86  Resp: 18 18  16   Temp: 97.6 F (36.4 C) (!) 97.5 F (36.4 C)  97.7 F (36.5 C)  TempSrc: Oral Oral  Oral  SpO2: 100% 100%  98%  Weight:   73.3 kg   Height:        Additional Objective Labs: Basic Metabolic Panel: Recent Labs  Lab 04/10/24 1106 04/10/24 1112 04/11/24 2037 04/12/24 0416  NA 134* 131* 125*  125* 126*  K 5.1 5.0 4.8  4.7 4.9  CL 93* 94* 85*  85* 89*  CO2 24  --  21*  22 19*  GLUCOSE 105* 107* 223*  224* 184*  BUN 56* 52* 77*  76* 77*  CREATININE 5.51* 6.30* 6.29*  6.11* 6.42*  CALCIUM  9.2  --  9.1  9.0 8.6*  PHOS  --   --  5.1*  --    Liver Function Tests: Recent Labs  Lab 04/10/24 1106 04/11/24 2037 04/12/24 0416  AST 26 28 21   ALT 22 26 23   ALKPHOS 126 146* 124  BILITOT 1.3* 1.2 1.2  PROT 5.8* 6.1* 5.5*  ALBUMIN  2.6* 2.7*  2.7* 2.5*   No results for input(s): LIPASE, AMYLASE in the last 168 hours. CBC: Recent Labs  Lab 04/10/24 1106 04/10/24 1112 04/10/24 2306 04/11/24 2037 04/12/24 0416  WBC 9.0  --   --  10.2 8.8  NEUTROABS 8.1*  --   --   --   --   HGB 8.7*   < > 11.2* 11.4* 10.7*  HCT 28.1*   < > 36.5 36.0 33.6*  MCV 102.9*  --   --  97.8 96.6  PLT 149*  --   --  164 147*   < > = values in this interval not displayed.   Blood Culture    Component Value Date/Time   SDES PLEURAL 04/11/2024 1101   SPECREQUEST NONE 04/11/2024 1101   CULT PENDING 04/11/2024 1101   REPTSTATUS PENDING 04/11/2024 1101    Cardiac Enzymes: No results for input(s): CKTOTAL, CKMB, CKMBINDEX, TROPONINI in the last 168 hours. CBG: Recent Labs  Lab 04/11/24 1112 04/11/24 1541 04/11/24 1716 04/11/24 2134 04/12/24 0717  GLUCAP  205* 220* 181* 220* 173*   Iron  Studies:  Recent Labs    04/10/24 1520  IRON  28  TIBC 220*  FERRITIN 1,300*   @lablastinr3 @ Studies/Results: DG CHEST PORT 1 VIEW Result Date: 04/11/2024 CLINICAL DATA:  Status post thoracentesis. EXAM: PORTABLE CHEST 1 VIEW COMPARISON:  04/10/2024. FINDINGS: Prominent cardiac silhouette. Small bilateral pleural effusions. No pneumothorax. Normal pulmonary vasculature. Calcified aorta. Thoracic degenerative disc disease. IMPRESSION: Small bilateral pleural effusions. No pneumothorax. Electronically Signed   By: Sydell Eva M.D.   On: 04/11/2024 13:33   ECHOCARDIOGRAM LIMITED Result Date: 04/10/2024    ECHOCARDIOGRAM LIMITED REPORT   Patient Name:   Anna Carr Date of Exam: 04/10/2024 Medical Rec #:  [id number]         Height:       61.0 in Accession #:    [id number]        Weight:       162.3 lb Date of Birth:  1941/06/30  BSA:          1.728 m Patient Age:    83 years          BP:           82/53 mmHg Patient Gender: F                 HR:           68 bpm. Exam Location:  Inpatient Procedure: 2D Echo, Cardiac Doppler and Color Doppler (Both Spectral and Color            Flow Doppler were utilized during procedure). Indications:    Septic Shock  History:        Patient has no prior history of Echocardiogram examinations and                 Patient has prior history of Echocardiogram examinations, most                 recent 03/02/2024. CHF, COPD; Risk Factors:Hypertension and                 Diabetes.  Sonographer:    Sulema Endo RDCS, FE, PE Referring Phys: [id number] PRAVEEN MANNAM IMPRESSIONS  1. Left ventricular ejection fraction, by estimation, is 50 to 55%. The left ventricle has low normal function. There is mild concentric left ventricular hypertrophy. Left ventricular diastolic parameters are consistent with Grade I diastolic dysfunction (impaired relaxation). Elevated left atrial pressure.  2. Left atrial size was severely dilated.  3. Right  atrial size was mildly dilated.  4. Mitral valve area by planimmetry 1.46 cm ^2. The mitral valve is degenerative. Moderate to severe mitral valve regurgitation. Mild mitral stenosis.  5. Tricuspid valve regurgitation is moderate.  6. There is moderate calcification of the aortic valve. There is mild thickening of the aortic valve. Aortic valve sclerosis/calcification is present, without any evidence of aortic stenosis. Aortic valve area, by VTI measures 0.96 cm. Aortic valve mean gradient measures 4.0 mmHg. Aortic valve Vmax measures 1.34 m/s.  7. There is normal pulmonary artery systolic pressure.  8. The inferior vena cava is dilated in size with >50% respiratory variability, suggesting right atrial pressure of 8 mmHg. FINDINGS  Left Ventricle: Left ventricular ejection fraction, by estimation, is 50 to 55%. The left ventricle has low normal function. There is mild concentric left ventricular hypertrophy. Left ventricular diastolic parameters are consistent with Grade I diastolic dysfunction (impaired relaxation). Elevated left atrial pressure. Right Ventricle: There is normal pulmonary artery systolic pressure. The tricuspid regurgitant velocity is 2.28 m/s, and with an assumed right atrial pressure of 8 mmHg, the estimated right ventricular systolic pressure is 28.8 mmHg. Left Atrium: Left atrial size was severely dilated. Right Atrium: Right atrial size was mildly dilated. Mitral Valve: Mitral valve area by planimmetry 1.46 cm ^2. The mitral valve is degenerative in appearance. Moderate to severe mitral valve regurgitation. Mild mitral valve stenosis. MV peak gradient, 9.6 mmHg. The mean mitral valve gradient is 3.0 mmHg. Tricuspid Valve: Tricuspid valve regurgitation is moderate. Aortic Valve: There is moderate calcification of the aortic valve. There is mild thickening of the aortic valve. Aortic valve sclerosis/calcification is present, without any evidence of aortic stenosis. Aortic valve mean gradient  measures 4.0 mmHg. Aortic valve peak gradient measures 7.1 mmHg. Aortic valve area, by VTI measures 0.96 cm. Venous: The inferior vena cava is dilated in size with greater than 50% respiratory variability, suggesting right atrial pressure of 8 mmHg. LEFT VENTRICLE  PLAX 2D LVIDd:         4.40 cm   Diastology LVIDs:         3.30 cm   LV e' medial:    5.98 cm/s LV PW:         1.20 cm   LV E/e' medial:  24.2 LV IVS:        1.10 cm   LV e' lateral:   9.03 cm/s LVOT diam:     1.80 cm   LV E/e' lateral: 16.1 LV SV:         31 LV SV Index:   18 LVOT Area:     2.54 cm  RIGHT VENTRICLE RV S prime:     6.31 cm/s TAPSE (M-mode): 0.8 cm LEFT ATRIUM             Index        RIGHT ATRIUM           Index LA Vol (A2C):   96.8 ml 56.01 ml/m  RA Area:     19.30 cm LA Vol (A4C):   83.6 ml 48.38 ml/m  RA Volume:   55.40 ml  32.06 ml/m LA Biplane Vol: 90.4 ml 52.31 ml/m  AORTIC VALVE AV Area (Vmax):    1.03 cm AV Area (Vmean):   1.06 cm AV Area (VTI):     0.96 cm AV Vmax:           133.50 cm/s AV Vmean:          91.250 cm/s AV VTI:            0.320 m AV Peak Grad:      7.1 mmHg AV Mean Grad:      4.0 mmHg LVOT Vmax:         54.00 cm/s LVOT Vmean:        37.900 cm/s LVOT VTI:          0.120 m LVOT/AV VTI ratio: 0.38  AORTA Ao Root diam: 2.70 cm MITRAL VALVE                TRICUSPID VALVE MV Area (PHT): 3.37 cm     TR Peak grad:   20.8 mmHg MV Area VTI:   0.77 cm     TR Vmax:        228.00 cm/s MV Peak grad:  9.6 mmHg MV Mean grad:  3.0 mmHg     SHUNTS MV Vmax:       1.55 m/s     Systemic VTI:  0.12 m MV Vmean:      81.2 cm/s    Systemic Diam: 1.80 cm MV Decel Time: 225 msec MV E velocity: 145.00 cm/s MV A velocity: 94.60 cm/s MV E/A ratio:  1.53 Maudine Sos MD Electronically signed by Maudine Sos MD Signature Date/Time: 04/10/2024/11:07:19 PM    Final    CT CHEST ABDOMEN PELVIS WO CONTRAST Result Date: 04/10/2024 CLINICAL DATA:  Recent fall, hypotension. EXAM: CT CHEST, ABDOMEN AND PELVIS WITHOUT CONTRAST  TECHNIQUE: Multidetector CT imaging of the chest, abdomen and pelvis was performed following the standard protocol without IV contrast. RADIATION DOSE REDUCTION: This exam was performed according to the departmental dose-optimization program which includes automated exposure control, adjustment of the mA and/or kV according to patient size and/or use of iterative reconstruction technique. COMPARISON:  None Available. FINDINGS: CT CHEST FINDINGS Cardiovascular: No significant vascular findings. Normal heart size. No pericardial effusion. Mediastinum/Nodes: No axillary or supraclavicular adenopathy. No mediastinal or  hilar adenopathy. No pericardial fluid. Esophagus normal. Lungs/Pleura: Lobar airspace disease in the LEFT lower lobe small effusion. Large RIGHT pleural effusion. Musculoskeletal: Fracture of the RIGHT second and fourth ribs anterior and laterally appear chronic. Fracture of the anterolateral fifth rib (image 72/5 appears more acute. Fracture of the anterior RIGHT seventh rib (image 112/series 5 also appears acute CT ABDOMEN AND PELVIS FINDINGS Hepatobiliary: No focal hepatic lesion. Large gallstone. No gallbladder inflammation. Pancreas: Pancreas is normal. No ductal dilatation. No pancreatic inflammation. Spleen: Normal spleen Adrenals/urinary tract: Adrenal glands normal. LEFT kidney small compared to the RIGHT. Ureters and bladder normal. Stomach/Bowel: Stomach, small bowel, appendix, and cecum are normal. Multiple diverticula of the descending colon and sigmoid colon without acute inflammation. Vascular/Lymphatic: Abdominal aorta is normal caliber with atherosclerotic calcification. There is no retroperitoneal or periportal lymphadenopathy. No pelvic lymphadenopathy. Reproductive: Uterus and adnexa unremarkable. Other: Moderate amount free fluid the pelvis. Some fluid along the RIGHT margin liver. Musculoskeletal: No pelvic fracture.  No acute spine fracture. IMPRESSION: CHEST: 1. Lobar airspace  disease in the LEFT lower lobe with small effusion. Findings consistent with LEFT lower lobe pneumonia 2. Acute or subacute fractures of the RIGHT fifth and seventh ribs. 3. More remote fractures of the RIGHT second and fourth ribs. 4. Large asymmetric RIGHT pleural effusion.  Etiology unclear. PELVIS: 1. Moderate amount of free fluid in the pelvis and along the RIGHT margin of the liver. 2. No pelvic fracture. No acute spine fracture. 3. Cholelithiasis without evidence of cholecystitis. 4.  Aortic Atherosclerosis (ICD10-I70.0). Electronically Signed   By: Deboraha Fallow M.D.   On: 04/10/2024 18:59   DG Chest Portable 1 View Result Date: 04/10/2024 CLINICAL DATA:  Hypoxia EXAM: PORTABLE CHEST 1 VIEW COMPARISON:  Mar 09, 2024 FINDINGS: Persistent bilateral pulmonary infiltrates that correlate with congestive changes with persistent bilateral pleural effusions right larger than left left. Basilar atelectasis. Calcification of the annulus of the mitral valve. Heart upper limits of normal. IMPRESSION: Mild residual congestive changes and pleural effusions Electronically Signed   By: Fredrich Jefferson M.D.   On: 04/10/2024 11:59   Medications:  cefTRIAXone  (ROCEPHIN )  IV     norepinephrine  (LEVOPHED ) Adult infusion Stopped (04/11/24 0609)    atorvastatin   10 mg Oral Daily   azithromycin  500 mg Oral Daily   calcitRIOL  0.25 mcg Oral Q T,Th,Sa-HD   Chlorhexidine  Gluconate Cloth  6 each Topical Daily   cosyntropin  0.25 mg Intravenous Once   darbepoetin (ARANESP ) injection - DIALYSIS  200 mcg Subcutaneous Q Tue-1800   feeding supplement  237 mL Oral BID BM   ferric citrate   420 mg Oral TID WC   insulin  aspart  0-6 Units Subcutaneous TID WC   mupirocin ointment  1 Application Nasal BID   naphazoline-pheniramine  1 drop Right Eye BID   pantoprazole  (PROTONIX ) IV  40 mg Intravenous Q24H    Physical Exam General: Chronically ill appearing female in NAD Head: Normocephalic, bruised face, R eye with slight  conjunctival hemorrhage  Neck: Supple. JVD not elevated. Lungs: Decreased in bases CTAB Heart: S1,S2 No M/R/G Abdomen: NABS, NT Lower extremities:Trace-1+ BLE, Wound RLE Foam drsg R knee/R ankle Neuro: Alert and oriented X 3. Moves all extremities spontaneously. Psych:  Responds to questions appropriately with a normal affect. Dialysis Access: L AVG  No bruit   Dialysis Orders: Center: [facility] T,Th,S  3.5 hrs 180NRe 400/Autoflow 1.5 70 kg 2.0 K/2.5 Ca - No heparin  - Calcitriol 0.25 mcg PO three times per week -  Mircera 225 mcg IV Q 2 weeks (Last dose 03/28/2024 due 04/11/2024)   Assessment/Plan:  Shock: 2/2 PNA Management per PCCM. Off pressors.  R Pleural effusion: PCCM doing thoracentesis at bedside.Yield 1.4 liters Per PCCM.   ESRD -  T,Th,S. AVG clotted 04/11/2024. HD today and short HD tomorrow to resume T,Th,S schedule 04/13/2024  Hypotension/volume  - BP stable off pressors. BLE edema. She has been leaving above OP EDW. Very much above EDW per weights if accurate. Will attempt to lower EDW as tolerated.   Anemia  - HGB 10.7 Low Tsat. Start Fe load. Max ESA dose today.   Metabolic bone disease -  Continue Auryxia  binder, VDRA   Nutrition - Albumin  2.6. FFT, add protein supplements  DMT2- per primary  9.    FFT-OT, PT needed-   Waverly Chavarria H. Abdulla Pooley NP-C 04/12/2024, 8:29 AM  [employer] [phone]

## 2024-04-12 NOTE — Procedures (Signed)
 Interventional Radiology Procedure Note  Procedure: Successful declot of LUE brachiocephalic arteriovenous graft. PTA of venous anastomotic stricture to 7 mm.   Complications: None  Estimated Blood Loss: None  Recommendations: - May resume dialysis    Signed,  Roxie Cord, MD

## 2024-04-12 NOTE — Progress Notes (Addendum)
 HD#2 Subjective:   Summary: Anna Carr is a 83 y.o. 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   Interval Events: Patient has been off pressors since 04/11/24 0600. At HD on 04/11/24 1400, patient's L AVG was found to have no bruit/thrill and IR was consulted to de-clot. Patient was transferred from ICU to IMTS 04/12/24 0700.   Overnight Events: Overnight, MAP recorded at 60 at 1500 but otherwise ranged from 69-97. RR ranging from 7-29 per comprehensive flowsheet. Otherwise, vital signs stable with MEWS 0 and 1 on RA and 1 L via .   This morning, patient endorsed feeling poorly. When asked if improved from her condition on admission, she shared that she did not know how she felt on admission. She endorsed one dark bowel movement yesterday, unchanged from prior. She denied pain, shortness of breath, and productive cough.   Objective:  Vital signs in last 24 hours: Vitals:   04/12/24 0100 04/12/24 0453 04/12/24 0500 04/12/24 0718  BP: (!) 127/57 132/64  90/66  Pulse: 81 85  86  Resp: 18 18  16   Temp: 97.6 F (36.4 C) (!) 97.5 F (36.4 C)  97.7 F (36.5 C)  TempSrc: Oral Oral  Oral  SpO2: 100% 100%  98%  Weight:   73.3 kg   Height:       Supplemental O2: Room Air (nasal cannula in place during exam but O2 flow at 0 L/min) SpO2: 98 % O2 Flow Rate (L/min): 1 L/min   Physical Exam:  Constitutional: Chronically ill but non-toxic appearing older woman lying comfortably in bed in no acute distress. HENT: Small scattered bruising over right forehead, large purple bruise covering right cheek and right jaw. CV: Regular rate and rhythm. L AVG no palpable thrill or bruit on auscultation. Pulm: Nasal cannula in place with 0 L O2. Normal work of breathing on room  air. Intermittent non-productive cough. Decreased breath sounds at lung bases bilaterally. No adventitious lung sounds appreciated. Neuro: Alert and responding appropriately. Psych: Normal mood and affect.  Labs:    Latest Ref Rng & Units 04/12/2024    4:16 AM 04/11/2024    8:37 PM 04/10/2024   11:06 PM  CBC  WBC 4.0 - 10.5 K/uL 8.8  10.2    Hemoglobin 12.0 - 15.0 g/dL 84.1  32.4  40.1   Hematocrit 36.0 - 46.0 % 33.6  36.0  36.5   Platelets 150 - 400 K/uL 147  164         Latest Ref Rng & Units 04/12/2024    4:16 AM 04/11/2024    8:37 PM 04/10/2024   11:12 AM  CMP  Glucose 70 - 99 mg/dL 027  253    664  403   BUN 8 - 23 mg/dL 77  76    77  52   Creatinine 0.44 - 1.00 mg/dL 4.74  2.59    5.63  8.75   Sodium 135 - 145 mmol/L 126  125    125  131   Potassium 3.5 - 5.1 mmol/L 4.9  4.7    4.8  5.0   Chloride 98 - 111 mmol/L 89  85    85  94   CO2 22 - 32 mmol/L 19  22    21     Calcium   8.9 - 10.3 mg/dL 8.6  9.0    9.1    Total Protein 6.5 - 8.1 g/dL 5.5  6.1    Total Bilirubin 0.0 - 1.2 mg/dL 1.2  1.2    Alkaline Phos 38 - 126 U/L 124  146    AST 15 - 41 U/L 21  28    ALT 0 - 44 U/L 23  26     CBG (last 3)  Recent Labs    04/11/24 1716 04/11/24 2134 04/12/24 0717  GLUCAP 181* 220* 173*   Lactate dehydrogenase, serum: 241 Lactate dehydrogenase, pleural: 112 Protein, serum: 5.5 Protein, pleural: < 3.0 Glucose, pleural: 141  Pleural fluid protein to serum protein ratio: 0.545 (using 3.0 for pleural fluid protein) Pleural fluid LDH to Serum LDH ratio: 0.465 Pleural fluid LDH to Upper limit of normal serum LDH ratio: 0.583  Given protein ratio > 0.5, likely exudative per Light's Criteria.  Micro Pleural fluid: 1.4 L collected, straw colored and hazy, total nucleated cell count 98, no WBCs or organisms seen. Culture pending. Blood cultures: no growth at 2 days. MRSA nares: detected (04/10/24).  Imaging DG Chest s/p thoracentesis: Small bilateral pleural effusions.  No pneumothorax.   Assessment/Plan:   Patient Summary: Anna Carr is a 83 y.o. 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  Hospital Problems #LLL CAP? Pneumonia #Exudative? Pleural Effusion #Bilateral pleural effusions on CXR #Acute hypoxic respiratory failure, improving Patient clinically improving with normal work of breathing, high O2 saturations on room air, and HDS off pressors. WBCs down-trending 10.2 to 8.8. Pleural effusion possibly exudative by Light's criteria, but low suspicion for parapneumonic effusion or empyema given low cell count and high glucose. Plan to de-escalate antibiotic coverage for presumed CAP. Low suspicion for MRSA pneumonia despite positive MRSA nares given clinical improvement and lack of classic imaging findings suspicious for MRSA PNA (ex. cavitations, empyema). Plan:  - Discontinue vancomycin and cefepime - Start ceftriaxone  2 g IV daily and doxycycline 100 mg oral BID for presumed CAP - Follow up on pleural fluid culture pending - Trend CBC with differential - Renal function panel tomorrow AM  #Chronic anemia with baseline hemoglobin around 9 s/p transfusion x 1 #Hx of GI bleed and angiectasias Patient reported another episode of dark stool yesterday. Suspects dark stool is due to her iron  supplementation .Hemoglobin down-trended from 11.4 to 10.7. Plan:  - Trend CBC and renal function panel as above - Ferric citrate  tablet 420 mg TID - Darbepoetin Alfa  200 mcg SQ Tuesdays - Protonix  40 mg IV daily  #ESRD on HD #L AVG with Clot #Chronic hyponatremia #HAGMA #Metabolic bone disease  Patient was found to have no bruit/thrill on L AVG yesterday before dialysis. IR consulted to de-clot. HAGMA  suspected 2/2 no HD yesterday. Plan: - Consulted IR; appreciate their recommendations  - NPO before de-clot intervention  - IR Declot Thromb Agent Implant Vasc Device/Cath today   - Consulted nephrology; appreciate their recommendations - Resume HD today and short HD tomorrow (04/13/24) to resume Tue/Thur/Sat - Auryxia  binder and VDRA per nephrology   #Hypoproteinemia (5.5) #Hypoalbuminemia (2.5) - Ensure feeding supplement 237 mL 2 times daily between meals  #Right lower extremity wounds 2/2 fall PCP prescribed Keflex but patient didn't start. Wound care consulted.  -  Continue to monitor   #R Eye Blurry Vision - Consider outpatient ophthalmology consult   Chronic Stable Conditions #Insulin -dependent diabetes mellitus Patient currently NPO.  - Insulin  aspart 0-6 SQ TID with meals - Monitor CBGs   #Hyperlipidemia/CAD (LDL 82 in 03/25) - Continue atorvastatin    #HTN/HFpEF - Continue holding home Coreg  and Lasix   Resolved #Elevated bilirubin, resolved  Diet: NPO in anticipation of IR intervention IVF: None,None VTE: SCDs Code: Full PT/OT recs: Home Health, wheelchair. Family Update: Called daughter 04/12/24 0900.  Dispo: Anticipated discharge to Home in 2 days pending medical stabilization and IV antibiotic treatment.   Lynnea Satchel  Attestation for Student Documentation:  I personally was present and re-performed the history, physical exam and medical decision-making activities of this service and have verified that the service and findings are accurately documented in the student's note.  Anna Carr is a 83 y.o. 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 She has gotten 2 doses of vancomycin and cefepime. De-escalating  to Ceftriaxone  and Vacomycin today. Her HD fistula is with clot and scheduled for IR Declot Thromb Agent Implant Vasc Device/Cath today.Will reassess her after the procedure.    Fay Hoop, MD 04/12/2024, 10:03 AM

## 2024-04-12 NOTE — Consult Note (Signed)
 Chief Complaint: Patient was seen in consultation today for clotted Left arm dialysis graft--- declot intervention at the request of Dr Dewight Fore  Supervising Physician: Fernando Hoyer  Patient Status: [facility] - In-pt  History of Present Illness: Anna Carr is a 83 y.o. female   FULL Code status per pt  ESRD- hemodialysis DM; HTN; CHF; Ao Stenosis 6/10 fall at home; weakness Adm 6/16 from ED Rt pleural effusion--- yesterday thoracentesis with PCCM ~1200 cc Last dialysis 04/08/24 per pt-- complete Unable to have dialysis yesterday--- clotted left arm graft  Request for IR evaluation and possible intervention Planned for IR today   Past Medical History:  Diagnosis Date   Acute on chronic anemia 02/25/2024   Acute pain of left shoulder 03/05/2024   Acute upper GI bleed 03/03/2024   Anemia due to chronic blood loss    Anemia in chronic kidney disease 01/23/2017   Anemia, chronic disease 01/23/2017   Angiodysplasia of small intestine 02/25/2024   Aortic stenosis moderate 2024 06/21/2023   Arteriovenous fistula, acquired ([name]) 11/02/2019   AV fistula ([name])    AVM (arteriovenous malformation)    AVM (arteriovenous malformation) of duodenum, acquired 03/03/2024   AVM (arteriovenous malformation) of duodenum, acquired with hemorrhage 10/30/2021   AVM (arteriovenous malformation) of small bowel, acquired with hemorrhage    C. difficile colitis 03/30/2021   Cardiac arrest ([name]) 03/02/2024   Cellulitis 03/16/2021   CHF with left ventricular diastolic dysfunction, NYHA class 1 ([name]) 02/02/2017   Cholelithiasis 03/30/2021   Chronic constipation 12/22/2015   Chronic laryngitis 02/17/2017   Last Assessment & Plan:   Formatting of this note might be different from the original.  Evaluation of larynx.  Was hospitalized for pneumonia and during that time she started noticing worsening hoarseness.  Denies any throat pain.  No obvious heartburn.  She did recently start PPI  therapy.  She smoked in the past.  Denies any difficulty swallowing.  Hoarseness is improving.  EXAM shows mildly ras   Coagulation defect, unspecified ([name]) 12/07/2019   Colon polyp    Complication of vascular dialysis catheter 03/05/2020   Contact with and (suspected) exposure to covid-19 10/28/2020   COPD mixed type ([name])    Cough, unspecified 10/28/2020   COVID-19 10/29/2020   Depression    Diverticulosis of large intestine without perforation or abscess without bleeding 01/08/2020   Dyspnea on exertion    Dyspnea, unspecified 11/02/2019   Elevated troponin 03/16/2021   Encounter for removal of sutures 12/28/2019   Encounter for screening for respiratory tuberculosis 12/06/2019   ESRD (end stage renal disease) ([name]) 07/18/2019   ESRD on dialysis ([name]) 09/18/2019   ESRD on hemodialysis ([name]) 01/02/2020   Fever, unspecified 01/23/2020   GI bleed 12/19/2021   GIB (gastrointestinal bleeding) 01/02/2020   Heme positive stool    High risk medication use 12/22/2015   History of blood transfusion    History of colonic polyps 01/08/2020   HLD (hyperlipidemia) 12/22/2015   Hypertension    Hypervolemia 11/13/2019   Hypokalemia 03/11/2019   Hyponatremia 03/11/2019   Hypovolemia 10/27/2021   Hypoxia 03/11/2019   Insomnia    Insulin  long-term use ([name]) 12/22/2015   Iron  deficiency anemia, unspecified 12/06/2019   Major depressive disorder, recurrent ([name]) 12/22/2015   Malaise and fatigue 12/23/2015   Mild protein-calorie malnutrition ([name]) 03/26/2020   Mitral regurgitation moderate to severe based on echocardiogram from 2022 11/03/2021   Musculoskeletal chest pain 03/03/2024   Osteoarthritis    Osteopenia  Other disorders of phosphorus metabolism 01/08/2020   PAC (premature atrial contraction) 10/04/2020   Pain, unspecified 12/06/2019   PAT (paroxysmal atrial tachycardia) ([name]) 10/04/2020   Peripheral vascular disease, unspecified ([name]) critical stenosis lower extremity  12/01/2023   Peripheral arterial disease     Pneumonia    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  Chodri CT annually.     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 dialysis, with long-term current use of insulin  ([name]) 12/23/2015   Type 2 diabetes mellitus, with long-term current use of insulin  ([name]) 03/11/2019   Unspecified hemorrhoids 01/08/2020    Past Surgical History:  Procedure Laterality Date   A/V FISTULAGRAM Left 02/17/2021   Procedure: A/V FISTULAGRAM;  Surgeon: Adine Hoof, MD;  Location: [facility] INVASIVE CV LAB;  Service: Cardiovascular;  Laterality: Left;   APPENDECTOMY     AV FISTULA PLACEMENT Left    AV FISTULA PLACEMENT Left 03/12/2021   Procedure: INSERTION OF LEFT UPPER ARM ARTERIOVENOUS (AV) GORE-TEX GRAFT;  Surgeon: Adine Hoof, MD;  Location: [facility] OR;  Service: Vascular;  Laterality: Left;   BALLOON ENTEROSCOPY  03/02/2024   Procedure: ENTEROSCOPY, USING BALLOON;  Surgeon: Annis Kinder, DO;  Location: [facility] ENDOSCOPY;  Service: Gastroenterology;;   COLONOSCOPY WITH PROPOFOL  N/A 03/18/2021   Procedure: COLONOSCOPY WITH PROPOFOL ;  Surgeon: Albertina Hugger, MD;  Location: [facility] ENDOSCOPY;  Service: Gastroenterology;  Laterality: N/A;   CYSTECTOMY     ENTEROSCOPY N/A 03/21/2021   Procedure: ENTEROSCOPY;  Surgeon: Albertina Hugger, MD;  Location: [facility] ENDOSCOPY;  Service: Gastroenterology;  Laterality: N/A;   ENTEROSCOPY N/A 12/20/2021   Procedure: ENTEROSCOPY;  Surgeon: Albertina Hugger, MD;  Location: [facility] ENDOSCOPY;  Service: Gastroenterology;  Laterality: N/A;   ENTEROSCOPY N/A 02/26/2024   Procedure: ENTEROSCOPY;   Surgeon: Nannette Babe, MD;  Location: [facility] ENDOSCOPY;  Service: Gastroenterology;  Laterality: N/A;   ENTEROSCOPY N/A 02/28/2024   Procedure: ENTEROSCOPY;  Surgeon: Annis Kinder, DO;  Location: [facility] ENDOSCOPY;  Service: Gastroenterology;  Laterality: N/A;   ESOPHAGOGASTRODUODENOSCOPY (EGD) WITH PROPOFOL  N/A 03/18/2021   Procedure: ESOPHAGOGASTRODUODENOSCOPY (EGD) WITH PROPOFOL ;  Surgeon: Albertina Hugger, MD;  Location: [facility] ENDOSCOPY;  Service: Gastroenterology;  Laterality: N/A;   GIVENS CAPSULE STUDY N/A 03/19/2021   Procedure: GIVENS CAPSULE STUDY;  Surgeon: Albertina Hugger, MD;  Location: [facility] ENDOSCOPY;  Service: Gastroenterology;  Laterality: N/A;   GIVENS CAPSULE STUDY N/A 02/29/2024   Procedure: IMAGING PROCEDURE, GI TRACT, INTRALUMINAL, VIA CAPSULE;  Surgeon: Annis Kinder, DO;  Location: [facility] ENDOSCOPY;  Service: Gastroenterology;  Laterality: N/A;   HEMOSTASIS CLIP PLACEMENT  12/20/2021   Procedure: HEMOSTASIS CLIP PLACEMENT;  Surgeon: Albertina Hugger, MD;  Location: [facility] ENDOSCOPY;  Service: Gastroenterology;;   HOT HEMOSTASIS N/A 03/21/2021   Procedure: HOT HEMOSTASIS (ARGON PLASMA COAGULATION/BICAP);  Surgeon: Albertina Hugger, MD;  Location: [facility] ENDOSCOPY;  Service: Gastroenterology;  Laterality: N/A;   HOT HEMOSTASIS N/A 12/20/2021   Procedure: HOT HEMOSTASIS (ARGON PLASMA COAGULATION/BICAP);  Surgeon: Albertina Hugger, MD;  Location: [facility] ENDOSCOPY;  Service: Gastroenterology;  Laterality: N/A;   HOT HEMOSTASIS N/A 03/02/2024   Procedure: EGD, WITH ARGON PLASMA COAGULATION;  Surgeon: Annis Kinder,  DO;  Location: [facility] ENDOSCOPY;  Service: Gastroenterology;  Laterality: N/A;   HYSTEROSCOPY     LOWER EXTREMITY ANGIOGRAPHY N/A 02/03/2024   Procedure: Lower Extremity Angiography;  Surgeon: Avanell Leigh, MD;  Location: [facility] INVASIVE CV LAB;  Service: Cardiovascular;  Laterality: N/A;   POLYPECTOMY  03/18/2021   Procedure: POLYPECTOMY;  Surgeon: Albertina Hugger, MD;   Location: [facility] ENDOSCOPY;  Service: Gastroenterology;;    Allergies: Atorvastatin   Medications: Prior to Admission medications   Medication Sig Start Date End Date Taking? Authorizing Provider  acetaminophen  (TYLENOL ) 500 MG tablet Take 500 mg by mouth every 6 (six) hours as needed for mild pain (pain score 1-3) or moderate pain (pain score 4-6).   Yes [provider]  ascorbic acid (VITAMIN C) 1000 MG tablet Take 1,000 mg by mouth at bedtime. 12/07/19  Yes [provider]  aspirin  EC 81 MG tablet Take 1 tablet (81 mg total) by mouth daily. Swallow whole. 12/01/23  Yes Krasowski, Robert J, MD  Biotin 5000 MCG TABS Take 5,000 mcg by mouth daily in the afternoon.   Yes [provider]  carboxymethylcellulose (REFRESH PLUS) 0.5 % SOLN Place 1 drop into both eyes 3 (three) times daily as needed (dry eyes).   Yes [provider]  carvedilol  (COREG ) 6.25 MG tablet Take 6.25 mg by mouth daily. 12/07/19  Yes [provider]  cyanocobalamin (VITAMIN B12) 1000 MCG tablet Take 1,000 mcg by mouth every evening.   Yes [provider]  ferric citrate  (AURYXIA ) 1 GM 210 MG(Fe) tablet Take 210 mg by mouth 3 (three) times daily with meals.   Yes [provider]  fluticasone  (FLONASE ) 50 MCG/ACT nasal spray Place 1 spray into both nostrils daily as needed (Congestion).   Yes [provider]  furosemide  (LASIX ) 80 MG tablet Take 80 mg by mouth daily in the afternoon. 04/03/21  Yes [provider]  ibuprofen (ADVIL) 200 MG tablet Take 200-400 mg by mouth every 6 (six) hours as needed for mild pain (pain score 1-3).   Yes [provider]  Insulin  Glargine-Lixisenatide  (SOLIQUA ) 100-33 UNT-MCG/ML SOPN Inject 6 Units into the skin in the morning. 03/11/24  Yes Zheng, Michael, DO  Lactobacillus (PROBIOTIC ACIDOPHILUS PO) Take 1 capsule by mouth in the morning.   Yes [provider]  lidocaine -prilocaine  (EMLA ) cream Apply 1  application topically as needed (prior to fisula access). Tuesday, Thursday, Saturday for dialysis 07/04/20  Yes [provider]  meloxicam (MOBIC) 7.5 MG tablet Take 7.5-15 mg by mouth daily as needed for pain. 04/05/24  Yes [provider]  Menthol -Methyl Salicylate  (MUSCLE RUB) 10-15 % CREA Apply 1 Application topically 2 (two) times daily. Apply to left shoulder. Patient taking differently: Apply 1 Application topically 2 (two) times daily as needed for muscle pain. Apply to left shoulder. 03/11/24  Yes Jearldine Mina, DO  Omega-3 Fatty Acids (FISH OIL PO) Take 1 capsule by mouth in the morning and at bedtime.   Yes [provider]  pantoprazole  (PROTONIX ) 40 MG tablet Take 1 tablet (40 mg total) by mouth 2 (two) times daily. 03/11/24  Yes Zheng, Michael, DO  polyethylene glycol (MIRALAX  / GLYCOLAX ) 17 g packet Take 17 g by mouth 2 (two) times daily as needed for moderate constipation.   Yes [provider]  Vitamin E 670 MG (1000 UT) CAPS Take 1,000 Units by mouth in the morning. 01/09/20  Yes [provider]  atorvastatin  (LIPITOR) 10 MG tablet Take 1 tablet (  10 mg total) by mouth daily. Patient not taking: Reported on 04/10/2024 12/01/23   Krasowski, Robert J, MD  benzonatate  (TESSALON ) 200 MG capsule Take 1 capsule (200 mg total) by mouth 3 (three) times daily as needed for cough. Patient not taking: Reported on 04/10/2024 03/11/24   Zheng, Michael, DO  cephALEXin (KEFLEX) 500 MG capsule Take 500 mg by mouth 2 (two) times daily. Patient not taking: Reported on 04/10/2024 04/07/24   [provider]  lidocaine  (LIDODERM ) 5 % Place 1 patch onto the skin daily. Remove & Discard patch within 12 hours or as directed by MD Patient not taking: Reported on 04/10/2024 03/11/24   Jearldine Mina, DO     Family History  Problem Relation Age of Onset   Diabetes Mother    Hypertension Mother    Diabetes Maternal Grandfather    Liver disease Other     Social  History   Socioeconomic History   Marital status: Widowed    Spouse name: Not on file   Number of children: 3   Years of education: Not on file   Highest education level: Not on file  Occupational History   Not on file  Tobacco Use   Smoking status: Former    Types: Cigarettes   Smokeless tobacco: Never   Tobacco comments:    quit in 1973  Vaping Use   Vaping status: Never Used  Substance and Sexual Activity   Alcohol  use: Not Currently   Drug use: Never   Sexual activity: Not Currently  Other Topics Concern   Not on file  Social History Narrative   Not on file   Social Drivers of Health   Financial Resource Strain: Low Risk  (10/27/2021)   Received from [facility]   Overall Financial Resource Strain (CARDIA)    Difficulty of Paying Living Expenses: Not hard at all  Food Insecurity: No Food Insecurity (04/10/2024)   Hunger Vital Sign    Worried About Running Out of Food in the Last Year: Never true    Ran Out of Food in the Last Year: Never true  Transportation Needs: No Transportation Needs (04/10/2024)   PRAPARE - Administrator, Civil Service (Medical): No    Lack of Transportation (Non-Medical): No  Physical Activity: Not on file  Stress: No Stress Concern Present (10/27/2021)   Received from [facility] of Occupational Health - Occupational Stress Questionnaire    Feeling of Stress : Not at all  Social Connections: Socially Isolated (04/10/2024)   Social Connection and Isolation Panel    Frequency of Communication with Friends and Family: Twice a week    Frequency of Social Gatherings with Friends and Family: Three times a week    Attends Religious Services: Never    Active Member of Clubs or Organizations: No    Attends Banker Meetings: Never    Marital Status: Widowed    Review of Systems: A 12 point ROS discussed and pertinent positives are indicated in the HPI above.  All other systems are negative.  Review  of Systems  Constitutional:  Positive for activity change. Negative for fever and unexpected weight change.  Respiratory:  Positive for shortness of breath.   Cardiovascular:  Negative for chest pain.  Psychiatric/Behavioral:  Negative for behavioral problems and confusion.     Vital Signs: BP 90/66 (BP Location: Right Arm)   Pulse 86   Temp 97.7 F (36.5 C) (Oral)   Resp 16  Ht 5' 1 (1.549 m)   Wt 161 lb 11.2 oz (73.3 kg)   SpO2 98%   BMI 30.55 kg/m   Advance Care Plan: The advanced care plan/surrogate decision maker was discussed at the time of visit and documented in the medical record.    Physical Exam HENT:     Mouth/Throat:     Mouth: Mucous membranes are moist.   Cardiovascular:     Rate and Rhythm: Normal rate and regular rhythm.     Heart sounds: Murmur heard.  Pulmonary:     Effort: Pulmonary effort is normal.     Breath sounds: Normal breath sounds.  Abdominal:     Palpations: Abdomen is soft.   Musculoskeletal:        General: Normal range of motion.     Comments: Left upper arm dialysis graft No thrill; no bruit   Skin:    General: Skin is warm.   Neurological:     Mental Status: She is alert and oriented to person, place, and time.   Psychiatric:        Behavior: Behavior normal.     Imaging: DG CHEST PORT 1 VIEW Result Date: 04/11/2024 CLINICAL DATA:  Status post thoracentesis. EXAM: PORTABLE CHEST 1 VIEW COMPARISON:  04/10/2024. FINDINGS: Prominent cardiac silhouette. Small bilateral pleural effusions. No pneumothorax. Normal pulmonary vasculature. Calcified aorta. Thoracic degenerative disc disease. IMPRESSION: Small bilateral pleural effusions. No pneumothorax. Electronically Signed   By: Sydell Eva M.D.   On: 04/11/2024 13:33   ECHOCARDIOGRAM LIMITED Result Date: 04/10/2024    ECHOCARDIOGRAM LIMITED REPORT   Patient Name:   Anna Carr Date of Exam: 04/10/2024 Medical Rec #:  [id number]         Height:       61.0 in Accession #:     [id number]        Weight:       162.3 lb Date of Birth:  02-14-1941         BSA:          1.728 m Patient Age:    83 years          BP:           82/53 mmHg Patient Gender: F                 HR:           68 bpm. Exam Location:  Inpatient Procedure: 2D Echo, Cardiac Doppler and Color Doppler (Both Spectral and Color            Flow Doppler were utilized during procedure). Indications:    Septic Shock  History:        Patient has no prior history of Echocardiogram examinations and                 Patient has prior history of Echocardiogram examinations, most                 recent 03/02/2024. CHF, COPD; Risk Factors:Hypertension and                 Diabetes.  Sonographer:    Sulema Endo RDCS, FE, PE Referring Phys: [id number] PRAVEEN MANNAM IMPRESSIONS  1. Left ventricular ejection fraction, by estimation, is 50 to 55%. The left ventricle has low normal function. There is mild concentric left ventricular hypertrophy. Left ventricular diastolic parameters are consistent with Grade I diastolic dysfunction (impaired relaxation). Elevated left atrial pressure.  2. Left atrial size  was severely dilated.  3. Right atrial size was mildly dilated.  4. Mitral valve area by planimmetry 1.46 cm ^2. The mitral valve is degenerative. Moderate to severe mitral valve regurgitation. Mild mitral stenosis.  5. Tricuspid valve regurgitation is moderate.  6. There is moderate calcification of the aortic valve. There is mild thickening of the aortic valve. Aortic valve sclerosis/calcification is present, without any evidence of aortic stenosis. Aortic valve area, by VTI measures 0.96 cm. Aortic valve mean gradient measures 4.0 mmHg. Aortic valve Vmax measures 1.34 m/s.  7. There is normal pulmonary artery systolic pressure.  8. The inferior vena cava is dilated in size with >50% respiratory variability, suggesting right atrial pressure of 8 mmHg. FINDINGS  Left Ventricle: Left ventricular ejection fraction, by estimation, is 50 to 55%.  The left ventricle has low normal function. There is mild concentric left ventricular hypertrophy. Left ventricular diastolic parameters are consistent with Grade I diastolic dysfunction (impaired relaxation). Elevated left atrial pressure. Right Ventricle: There is normal pulmonary artery systolic pressure. The tricuspid regurgitant velocity is 2.28 m/s, and with an assumed right atrial pressure of 8 mmHg, the estimated right ventricular systolic pressure is 28.8 mmHg. Left Atrium: Left atrial size was severely dilated. Right Atrium: Right atrial size was mildly dilated. Mitral Valve: Mitral valve area by planimmetry 1.46 cm ^2. The mitral valve is degenerative in appearance. Moderate to severe mitral valve regurgitation. Mild mitral valve stenosis. MV peak gradient, 9.6 mmHg. The mean mitral valve gradient is 3.0 mmHg. Tricuspid Valve: Tricuspid valve regurgitation is moderate. Aortic Valve: There is moderate calcification of the aortic valve. There is mild thickening of the aortic valve. Aortic valve sclerosis/calcification is present, without any evidence of aortic stenosis. Aortic valve mean gradient measures 4.0 mmHg. Aortic valve peak gradient measures 7.1 mmHg. Aortic valve area, by VTI measures 0.96 cm. Venous: The inferior vena cava is dilated in size with greater than 50% respiratory variability, suggesting right atrial pressure of 8 mmHg. LEFT VENTRICLE PLAX 2D LVIDd:         4.40 cm   Diastology LVIDs:         3.30 cm   LV e' medial:    5.98 cm/s LV PW:         1.20 cm   LV E/e' medial:  24.2 LV IVS:        1.10 cm   LV e' lateral:   9.03 cm/s LVOT diam:     1.80 cm   LV E/e' lateral: 16.1 LV SV:         31 LV SV Index:   18 LVOT Area:     2.54 cm  RIGHT VENTRICLE RV S prime:     6.31 cm/s TAPSE (M-mode): 0.8 cm LEFT ATRIUM             Index        RIGHT ATRIUM           Index LA Vol (A2C):   96.8 ml 56.01 ml/m  RA Area:     19.30 cm LA Vol (A4C):   83.6 ml 48.38 ml/m  RA Volume:   55.40 ml  32.06  ml/m LA Biplane Vol: 90.4 ml 52.31 ml/m  AORTIC VALVE AV Area (Vmax):    1.03 cm AV Area (Vmean):   1.06 cm AV Area (VTI):     0.96 cm AV Vmax:           133.50 cm/s AV Vmean:  91.250 cm/s AV VTI:            0.320 m AV Peak Grad:      7.1 mmHg AV Mean Grad:      4.0 mmHg LVOT Vmax:         54.00 cm/s LVOT Vmean:        37.900 cm/s LVOT VTI:          0.120 m LVOT/AV VTI ratio: 0.38  AORTA Ao Root diam: 2.70 cm MITRAL VALVE                TRICUSPID VALVE MV Area (PHT): 3.37 cm     TR Peak grad:   20.8 mmHg MV Area VTI:   0.77 cm     TR Vmax:        228.00 cm/s MV Peak grad:  9.6 mmHg MV Mean grad:  3.0 mmHg     SHUNTS MV Vmax:       1.55 m/s     Systemic VTI:  0.12 m MV Vmean:      81.2 cm/s    Systemic Diam: 1.80 cm MV Decel Time: 225 msec MV E velocity: 145.00 cm/s MV A velocity: 94.60 cm/s MV E/A ratio:  1.53 Maudine Sos MD Electronically signed by Maudine Sos MD Signature Date/Time: 04/10/2024/11:07:19 PM    Final    CT CHEST ABDOMEN PELVIS WO CONTRAST Result Date: 04/10/2024 CLINICAL DATA:  Recent fall, hypotension. EXAM: CT CHEST, ABDOMEN AND PELVIS WITHOUT CONTRAST TECHNIQUE: Multidetector CT imaging of the chest, abdomen and pelvis was performed following the standard protocol without IV contrast. RADIATION DOSE REDUCTION: This exam was performed according to the departmental dose-optimization program which includes automated exposure control, adjustment of the mA and/or kV according to patient size and/or use of iterative reconstruction technique. COMPARISON:  None Available. FINDINGS: CT CHEST FINDINGS Cardiovascular: No significant vascular findings. Normal heart size. No pericardial effusion. Mediastinum/Nodes: No axillary or supraclavicular adenopathy. No mediastinal or hilar adenopathy. No pericardial fluid. Esophagus normal. Lungs/Pleura: Lobar airspace disease in the LEFT lower lobe small effusion. Large RIGHT pleural effusion. Musculoskeletal: Fracture of the RIGHT second  and fourth ribs anterior and laterally appear chronic. Fracture of the anterolateral fifth rib (image 72/5 appears more acute. Fracture of the anterior RIGHT seventh rib (image 112/series 5 also appears acute CT ABDOMEN AND PELVIS FINDINGS Hepatobiliary: No focal hepatic lesion. Large gallstone. No gallbladder inflammation. Pancreas: Pancreas is normal. No ductal dilatation. No pancreatic inflammation. Spleen: Normal spleen Adrenals/urinary tract: Adrenal glands normal. LEFT kidney small compared to the RIGHT. Ureters and bladder normal. Stomach/Bowel: Stomach, small bowel, appendix, and cecum are normal. Multiple diverticula of the descending colon and sigmoid colon without acute inflammation. Vascular/Lymphatic: Abdominal aorta is normal caliber with atherosclerotic calcification. There is no retroperitoneal or periportal lymphadenopathy. No pelvic lymphadenopathy. Reproductive: Uterus and adnexa unremarkable. Other: Moderate amount free fluid the pelvis. Some fluid along the RIGHT margin liver. Musculoskeletal: No pelvic fracture.  No acute spine fracture. IMPRESSION: CHEST: 1. Lobar airspace disease in the LEFT lower lobe with small effusion. Findings consistent with LEFT lower lobe pneumonia 2. Acute or subacute fractures of the RIGHT fifth and seventh ribs. 3. More remote fractures of the RIGHT second and fourth ribs. 4. Large asymmetric RIGHT pleural effusion.  Etiology unclear. PELVIS: 1. Moderate amount of free fluid in the pelvis and along the RIGHT margin of the liver. 2. No pelvic fracture. No acute spine fracture. 3. Cholelithiasis without evidence of cholecystitis. 4.  Aortic Atherosclerosis (ICD10-I70.0).  Electronically Signed   By: Deboraha Fallow M.D.   On: 04/10/2024 18:59   DG Chest Portable 1 View Result Date: 04/10/2024 CLINICAL DATA:  Hypoxia EXAM: PORTABLE CHEST 1 VIEW COMPARISON:  Mar 09, 2024 FINDINGS: Persistent bilateral pulmonary infiltrates that correlate with congestive changes with  persistent bilateral pleural effusions right larger than left left. Basilar atelectasis. Calcification of the annulus of the mitral valve. Heart upper limits of normal. IMPRESSION: Mild residual congestive changes and pleural effusions Electronically Signed   By: Fredrich Jefferson M.D.   On: 04/10/2024 11:59    Labs:  CBC: Recent Labs    03/10/24 0607 04/10/24 1106 04/10/24 1112 04/10/24 1520 04/10/24 2306 04/11/24 2037 04/12/24 0416  WBC 6.5 9.0  --   --   --  10.2 8.8  HGB 9.1* 8.7*   < > 9.1* 11.2* 11.4* 10.7*  HCT 28.4* 28.1*   < > 28.9* 36.5 36.0 33.6*  PLT 204 149*  --   --   --  164 147*   < > = values in this interval not displayed.    COAGS: Recent Labs    03/02/24 1132 04/10/24 1106  INR 1.4* 1.3*  APTT 33  --     BMP: Recent Labs    03/11/24 0453 04/10/24 1106 04/10/24 1112 04/11/24 2037 04/12/24 0416  NA 125* 134* 131* 125*  125* 126*  K 4.9 5.1 5.0 4.8  4.7 4.9  CL 86* 93* 94* 85*  85* 89*  CO2 24 24  --  21*  22 19*  GLUCOSE 129* 105* 107* 223*  224* 184*  BUN 53* 56* 52* 77*  76* 77*  CALCIUM  9.0 9.2  --  9.1  9.0 8.6*  CREATININE 5.91* 5.51* 6.30* 6.29*  6.11* 6.42*  GFRNONAA 7* 7*  --  6*  6* 6*    LIVER FUNCTION TESTS: Recent Labs    03/02/24 1132 03/03/24 0500 03/11/24 0453 04/10/24 1106 04/11/24 2037 04/12/24 0416  BILITOT 1.1  --   --  1.3* 1.2 1.2  AST 74*  --   --  26 28 21   ALT 52*  --   --  22 26 23   ALKPHOS 57  --   --  126 146* 124  PROT 4.8*  --   --  5.8* 6.1* 5.5*  ALBUMIN  2.7*   < > 2.3* 2.6* 2.7*  2.7* 2.5*   < > = values in this interval not displayed.    TUMOR MARKERS: No results for input(s): AFPTM, CEA, CA199, CHROMGRNA in the last 8760 hours.  Assessment and Plan:  Scheduled for left upper arm dialysis graft evaluation and possible intervention Possible tunneled dialysis catheter if needed Risks and benefits discussed with the patient including, but not limited to bleeding, infection,  vascular injury, pulmonary embolism, need for tunneled HD catheter placement or even death.  All of the patient's questions were answered, patient is agreeable to proceed. Consent signed and in chart.  Thank you for this interesting consult.  I greatly enjoyed meeting BOBBYE PETTI and look forward to participating in their care.  A copy of this report was sent to the requesting provider on this date.  Electronically Signed: Ellen Guppy, PA-C 04/12/2024, 9:04 AM   I spent a total of 20 Minutes    in face to face in clinical consultation, greater than 50% of which was counseling/coordinating care for left upper arm dialysis graft declot

## 2024-04-13 DIAGNOSIS — D649 Anemia, unspecified: Secondary | ICD-10-CM

## 2024-04-13 LAB — GLUCOSE, CAPILLARY
Glucose-Capillary: 165 mg/dL — ABNORMAL HIGH (ref 70–99)
Glucose-Capillary: 200 mg/dL — ABNORMAL HIGH (ref 70–99)
Glucose-Capillary: 241 mg/dL — ABNORMAL HIGH (ref 70–99)
Glucose-Capillary: 131 mg/dL — ABNORMAL HIGH (ref 70–99)
Glucose-Capillary: 191 mg/dL — ABNORMAL HIGH (ref 70–99)

## 2024-04-13 LAB — CBC
HCT: 30.9 % — ABNORMAL LOW (ref 36.0–46.0)
Hemoglobin: 9.8 g/dL — ABNORMAL LOW (ref 12.0–15.0)
MCH: 30.8 pg (ref 26.0–34.0)
MCHC: 31.7 g/dL (ref 30.0–36.0)
MCV: 97.2 fL (ref 80.0–100.0)
Platelets: 163 10*3/uL (ref 150–400)
RBC: 3.18 MIL/uL — ABNORMAL LOW (ref 3.87–5.11)
RDW: 17.9 % — ABNORMAL HIGH (ref 11.5–15.5)
WBC: 6.2 10*3/uL (ref 4.0–10.5)
nRBC: 0 % (ref 0.0–0.2)

## 2024-04-13 LAB — PATHOLOGIST SMEAR REVIEW

## 2024-04-13 LAB — RENAL FUNCTION PANEL
Albumin: 2.5 g/dL — ABNORMAL LOW (ref 3.5–5.0)
Anion gap: 17 — ABNORMAL HIGH (ref 5–15)
BUN: 44 mg/dL — ABNORMAL HIGH (ref 8–23)
CO2: 23 mmol/L (ref 22–32)
Calcium: 8.6 mg/dL — ABNORMAL LOW (ref 8.9–10.3)
Chloride: 90 mmol/L — ABNORMAL LOW (ref 98–111)
Creatinine, Ser: 4.44 mg/dL — ABNORMAL HIGH (ref 0.44–1.00)
GFR, Estimated: 9 mL/min — ABNORMAL LOW (ref >60)
Glucose, Bld: 236 mg/dL — ABNORMAL HIGH (ref 70–99)
Phosphorus: 3.7 mg/dL (ref 2.5–4.6)
Potassium: 4.3 mmol/L (ref 3.5–5.1)
Sodium: 130 mmol/L — ABNORMAL LOW (ref 135–145)

## 2024-04-13 LAB — CYTOLOGY - NON PAP

## 2024-04-13 LAB — HEMOGLOBIN AND HEMATOCRIT, BLOOD
HCT: 30.3 % — ABNORMAL LOW (ref 36.0–46.0)
Hemoglobin: 9.7 g/dL — ABNORMAL LOW (ref 12.0–15.0)

## 2024-04-13 LAB — HEPATITIS B SURFACE ANTIBODY, QUANTITATIVE: Hep B S AB Quant (Post): <3.5 m[IU]/mL — ABNORMAL LOW

## 2024-04-13 MED ORDER — PENTAFLUOROPROP-TETRAFLUOROETH EX AERO: 1.0000 | INHALATION_SPRAY | CUTANEOUS | Status: DC | PRN

## 2024-04-13 MED ORDER — LIDOCAINE HCL (PF) 1 % IJ SOLN: 5.0000 mL | INTRAMUSCULAR | Status: DC | PRN

## 2024-04-13 MED ORDER — AMOXICILLIN-POT CLAVULANATE 500-125 MG PO TABS
1.0000 | ORAL_TABLET | ORAL | Status: DC
  Filled 2024-04-13 (×2): qty 1

## 2024-04-13 MED ORDER — AMOXICILLIN-POT CLAVULANATE 500-125 MG PO TABS: 1.0000 | ORAL_TABLET | ORAL | Status: DC

## 2024-04-13 MED ORDER — AMOXICILLIN-POT CLAVULANATE 500-125 MG PO TABS
1.0000 | ORAL_TABLET | Freq: Two times a day (BID) | ORAL | Status: DC
  Filled 2024-04-13 (×2): qty 1

## 2024-04-13 MED ORDER — LIDOCAINE-PRILOCAINE 2.5-2.5 % EX CREA: 1.0000 | TOPICAL_CREAM | CUTANEOUS | Status: DC | PRN

## 2024-04-13 MED ORDER — MELATONIN 5 MG PO TABS
5.0000 mg | ORAL_TABLET | Freq: Every day | ORAL | Status: DC
  Filled 2024-04-13: qty 1

## 2024-04-13 MED ORDER — GLUCERNA SHAKE PO LIQD
237.0000 mL | Freq: Three times a day (TID) | ORAL | Status: DC
  Administered 2024-04-14 (×2): 237 mL via ORAL

## 2024-04-13 MED ORDER — CALCITRIOL 0.25 MCG PO CAPS
ORAL_CAPSULE | ORAL | Status: AC
  Filled 2024-04-13: qty 1

## 2024-04-13 MED ORDER — HEPARIN SODIUM (PORCINE) 1000 UNIT/ML DIALYSIS: 1000.0000 [IU] | INTRAMUSCULAR | Status: DC | PRN

## 2024-04-13 NOTE — Progress Notes (Signed)
   04/13/24 0454  Vitals  Temp 97.7 F (36.5 C)  Temp Source Oral  BP (!) 129/102  MAP (mmHg) 111  BP Location Right Arm  BP Method Automatic  Pulse Rate (!) 128  ECG Heart Rate 97  Resp (!) 28  Oxygen Therapy  SpO2 100 %  O2 Device Nasal Cannula  O2 Flow Rate (L/min) 2 L/min  During Treatment Monitoring  Blood Flow Rate (mL/min) 299 mL/min  Arterial Pressure (mmHg) -121.2 mmHg  Venous Pressure (mmHg) 151.91 mmHg  TMP (mmHg) 22.02 mmHg  Ultrafiltration Rate (mL/min) 1176 mL/min  Dialysate Flow Rate (mL/min) 299 ml/min  Dialysate Potassium Concentration 2  Dialysate Calcium  Concentration 2.5  Duration of HD Treatment -hour(s) 3.47 hour(s)  Cumulative Fluid Removed (mL) per Treatment  2365.41  HD Safety Checks Performed Yes  Intra-Hemodialysis Comments Tx completed  Dialysis Fluid Bolus Normal Saline  Bolus Amount (mL) 300 mL  Post Treatment  Dialyzer Clearance Lightly streaked  Liters Processed 70.2  Fluid Removed (mL) 2400 mL  Tolerated HD Treatment Yes  AVG/AVF Arterial Site Held (minutes) 10 minutes  AVG/AVF Venous Site Held (minutes) 10 minutes  Fistula / Graft Left Upper arm Arteriovenous vein graft  Placement Date/Time: 03/12/21 1254   Orientation: Left  Access Location: Upper arm  Access Type: Arteriovenous vein graft  Site Condition No complications  Fistula / Graft Assessment Present;Thrill;Bruit (recent de clotting of AV access)  Status Deaccessed  Needle Size 15  Drainage Description None

## 2024-04-13 NOTE — Progress Notes (Signed)
 [facility] Progress Note   Subjective: Finished HD this AM around 0500 Net UF 2.4 liters. Short HD today to resume T,Th,S schedule.   Seen in room. O2 sats down to 83% on RA. Placed on 4L/M [place] with O2 sats improved to 94-97%. Sleepy, very tired. No C/Os.   Objective Vitals:   04/13/24 0527 04/13/24 0559 04/13/24 0648 04/13/24 0820  BP: (!) 129/91 (!) 143/76  (!) 126/48  Pulse:  95 93 91  Resp: 20 20  18   Temp:  97.9 F (36.6 C)  97.8 F (36.6 C)  TempSrc:  Oral  Oral  SpO2:  93% 92% 97%  Weight:  74.1 kg    Height:        Additional Objective Labs: Basic Metabolic Panel: Recent Labs  Lab 04/11/24 2037 04/12/24 0416 04/12/24 2137  NA 125*  125* 126* 125*  K 4.8  4.7 4.9 5.2*  CL 85*  85* 89* 87*  CO2 21*  22 19* 19*  GLUCOSE 223*  224* 184* 191*  BUN 77*  76* 77* 85*  CREATININE 6.29*  6.11* 6.42* 6.79*  CALCIUM  9.1  9.0 8.6* 9.2  PHOS 5.1*  --  5.4*   Liver Function Tests: Recent Labs  Lab 04/10/24 1106 04/11/24 2037 04/12/24 0416 04/12/24 2137  AST 26 28 21   --   ALT 22 26 23   --   ALKPHOS 126 146* 124  --   BILITOT 1.3* 1.2 1.2  --   PROT 5.8* 6.1* 5.5*  --   ALBUMIN  2.6* 2.7*  2.7* 2.5* 2.8*   No results for input(s): LIPASE, AMYLASE in the last 168 hours. CBC: Recent Labs  Lab 04/10/24 1106 04/10/24 1112 04/11/24 2037 04/12/24 0416 04/12/24 2137  WBC 9.0  --  10.2 8.8 7.6  NEUTROABS 8.1*  --   --   --  6.4  HGB 8.7*   < > 11.4* 10.7* 11.5*  HCT 28.1*   < > 36.0 33.6* 35.4*  MCV 102.9*  --  97.8 96.6 96.2  PLT 149*  --  164 147* 156   < > = values in this interval not displayed.   Blood Culture    Component Value Date/Time   SDES PLEURAL 04/11/2024 1101   SPECREQUEST NONE 04/11/2024 1101   CULT  04/11/2024 1101    NO GROWTH < 24 HOURS Performed at [facility], [address] [place], [place] [address]    REPTSTATUS PENDING 04/11/2024 1101    Cardiac Enzymes: No results for input(s):  CKTOTAL, CKMB, CKMBINDEX, TROPONINI in the last 168 hours. CBG: Recent Labs  Lab 04/12/24 0717 04/12/24 1013 04/12/24 1623 04/12/24 2119 04/13/24 0818  GLUCAP 173* 150* 152* 189* 165*   Iron  Studies:  Recent Labs    04/10/24 1520  IRON  28  TIBC 220*  FERRITIN 1,300*   @lablastinr3 @ Studies/Results: IR THROMBECTOMY AV FISTULA W/THROMBOLYSIS/PTA INC/SHUNT/IMG LEFT Result Date: 04/12/2024 INDICATION: 83 year old female with end-stage renal disease on hemodialysis via a left upper extremity arteriovenous fistula which was partially repaired with a 4-7 mm straight graft replacing a portion of the draining cephalic vein. She comes in with thrombosis of the access. EXAM: IR ULTRASOUND GUIDANCE VASC ACCESS LEFT; IR THROMBECTOMY AV FISTULA W/THROMBOLYSIS/PTA INC/SHUNT/IMG LEFT MEDICATIONS: 5000 units heparin  given intravenously by the Radiology nurse. 4 mg tPA administered into the fistula by me. ANESTHESIA/SEDATION: A total of 1.5 mg Versed  and 50 mcg fentanyl  was administered intravenously by the Radiology nurse. Moderate Sedation Time:  54  minutes The patient's vital signs and level of consciousness were continuously monitored during the procedure by the interventional radiology nurse under my direct supervision. FLUOROSCOPY TIME:  Fluoroscopy Time:  minutes  seconds ( mGy). COMPLICATIONS: None immediate. PROCEDURE: Informed written consent was obtained from the patient after a thorough discussion of the procedural risks, benefits and alternatives. All questions were addressed. Maximal Sterile Barrier Technique was utilized including caps, mask, sterile gowns, sterile gloves, sterile drape, hand hygiene and skin antiseptic. A timeout was performed prior to the initiation of the procedure. The proximal aspect of the graft was interrogated with ultrasound and found to be thrombosed. An image was obtained and stored for the medical record. Local anesthesia was attained by infiltration with 1%  lidocaine . A small dermatotomy was made. Under real-time sonographic guidance, the vessel was punctured in antegrade fashion with a 21 gauge micropuncture needle. Using standard technique, the initial micro needle was exchanged over a 0.018 micro wire for a transitional 4 [place] micro sheath. 2 mg tPA were administered through the micro sheath into the proximal aspect of the graft. The peripheral aspect of the graft was interrogated with ultrasound and found to be thrombosed. An image was obtained and stored for the medical record. Local anesthesia was attained by infiltration with 1% lidocaine . A small dermatotomy was made. Under real-time sonographic guidance, the vessel was punctured in retrograde fashion directed toward the arterial anastomosis with a 21 gauge micropuncture needle. Using standard technique, the initial micro needle was exchanged over a 0.018 micro wire for a transitional 4 [place] micro sheath. 2 mg tPA were administered through the micro sheath into the more peripheral aspect of the graft in the upper arm. Attention was turned proximal access. The 4 French micro sheath was exchanged over a Bentson wire for a 6 [place] sheath. An angled catheter was advanced over the Bentson wire into the left axillary vein. A central venogram was performed. The axillary, subclavian, and innominate veins are all widely patent as is the superior vena cava. No evidence of central stenosis. A pull-back left upper extremity venogram was performed. The cephalic arch is widely patent. Thrombus begins within the cephalic vein in the upper arm and extends through the graft. There is obvious stenosis at the graft to cephalic venous anastomosis. The wire and catheter were removed. A 6 French cleaner device was advanced through the sheath and into the thrombosed segment of the graft and vein. Mechanical thrombectomy was then performed. The wire was then reintroduced into the central veins. A 7 x 100 mm mustang balloon was  advanced through the distal graft and into the vein. Inflation was performed. The balloon was inflated to full effacement. There was significant narrowing at the venous anastomosis. The inflation was maintained for 60 seconds. The balloon was then deflated and removed. Attention was turned to the retrograde access. Over a 0.035 wire, the micro sheath was exchanged for a 7 [place] vascular sheath. An angled catheter and Glidewire were used to navigate into the brachial artery. A brachial arteriogram was performed confirming persistent occlusion of the arterial anastomosis. No evidence of distal embolization. A Bentson wire was advanced into the brachial artery. The angled catheter was removed. A Fogarty catheter was advanced over the wire. The Fogarty catheter was inflated and used to pull the arterial plug back into the graft. This was performed several times. Attempts were made to aspirate the arterial plug which was unsuccessful. The 5 French catheter was reintroduced into the brachial artery. Contrast injection demonstrates  patency of the arterial anastomosis but there is still no significant forward flow into the fistula. Therefore, a 4 x 40 mm mustang balloon was advanced across the arterial anastomosis in this was angioplastied to 4 mm. The balloon was then used to angioplasty the proximal aspect of the graft. Follow-up contrast injection demonstrates a persistent occlusive thrombus positioned in the mid aspect of the graft. Therefore, the 7 x 100 mm balloon was reintroduced and used to angioplasty the central aspect of the graft. Following this, repeat venography demonstrates wide patency of the graft with brisk flow. Repeat venography was also performed centrally demonstrating continued adequate flow and no evidence of residual stenosis. IMPRESSION: 1. Completely thrombosed left upper extremity brachiocephalic arteriovenous graft/fistula. 2. Thrombosis appears to be due to a high-grade stenosis of  approximately 60% at the graft to cephalic venous anastomosis. 3. Successful declot procedure including pharmacological and mechanical thrombectomy. 4. Successful angioplasty of venous anastomotic stenosis with less than 5% residual stenosis. ACCESS: This access remains amenable to future percutaneous interventions as clinically indicated. Electronically Signed   By: Fernando Hoyer M.D.   On: 04/12/2024 16:32   IR US  Guide Vasc Access Left Result Date: 04/12/2024 INDICATION: 83 year old female with end-stage renal disease on hemodialysis via a left upper extremity arteriovenous fistula which was partially repaired with a 4-7 mm straight graft replacing a portion of the draining cephalic vein. She comes in with thrombosis of the access. EXAM: IR ULTRASOUND GUIDANCE VASC ACCESS LEFT; IR THROMBECTOMY AV FISTULA W/THROMBOLYSIS/PTA INC/SHUNT/IMG LEFT MEDICATIONS: 5000 units heparin  given intravenously by the Radiology nurse. 4 mg tPA administered into the fistula by me. ANESTHESIA/SEDATION: A total of 1.5 mg Versed  and 50 mcg fentanyl  was administered intravenously by the Radiology nurse. Moderate Sedation Time:  54 minutes The patient's vital signs and level of consciousness were continuously monitored during the procedure by the interventional radiology nurse under my direct supervision. FLUOROSCOPY TIME:  Fluoroscopy Time:  minutes  seconds ( mGy). COMPLICATIONS: None immediate. PROCEDURE: Informed written consent was obtained from the patient after a thorough discussion of the procedural risks, benefits and alternatives. All questions were addressed. Maximal Sterile Barrier Technique was utilized including caps, mask, sterile gowns, sterile gloves, sterile drape, hand hygiene and skin antiseptic. A timeout was performed prior to the initiation of the procedure. The proximal aspect of the graft was interrogated with ultrasound and found to be thrombosed. An image was obtained and stored for the medical record.  Local anesthesia was attained by infiltration with 1% lidocaine . A small dermatotomy was made. Under real-time sonographic guidance, the vessel was punctured in antegrade fashion with a 21 gauge micropuncture needle. Using standard technique, the initial micro needle was exchanged over a 0.018 micro wire for a transitional 4 [place] micro sheath. 2 mg tPA were administered through the micro sheath into the proximal aspect of the graft. The peripheral aspect of the graft was interrogated with ultrasound and found to be thrombosed. An image was obtained and stored for the medical record. Local anesthesia was attained by infiltration with 1% lidocaine . A small dermatotomy was made. Under real-time sonographic guidance, the vessel was punctured in retrograde fashion directed toward the arterial anastomosis with a 21 gauge micropuncture needle. Using standard technique, the initial micro needle was exchanged over a 0.018 micro wire for a transitional 4 [place] micro sheath. 2 mg tPA were administered through the micro sheath into the more peripheral aspect of the graft in the upper arm. Attention was turned proximal access. The 4 [place]  micro sheath was exchanged over a Bentson wire for a 6 [place] sheath. An angled catheter was advanced over the Bentson wire into the left axillary vein. A central venogram was performed. The axillary, subclavian, and innominate veins are all widely patent as is the superior vena cava. No evidence of central stenosis. A pull-back left upper extremity venogram was performed. The cephalic arch is widely patent. Thrombus begins within the cephalic vein in the upper arm and extends through the graft. There is obvious stenosis at the graft to cephalic venous anastomosis. The wire and catheter were removed. A 6 French cleaner device was advanced through the sheath and into the thrombosed segment of the graft and vein. Mechanical thrombectomy was then performed. The wire was then reintroduced into  the central veins. A 7 x 100 mm mustang balloon was advanced through the distal graft and into the vein. Inflation was performed. The balloon was inflated to full effacement. There was significant narrowing at the venous anastomosis. The inflation was maintained for 60 seconds. The balloon was then deflated and removed. Attention was turned to the retrograde access. Over a 0.035 wire, the micro sheath was exchanged for a 7 [place] vascular sheath. An angled catheter and Glidewire were used to navigate into the brachial artery. A brachial arteriogram was performed confirming persistent occlusion of the arterial anastomosis. No evidence of distal embolization. A Bentson wire was advanced into the brachial artery. The angled catheter was removed. A Fogarty catheter was advanced over the wire. The Fogarty catheter was inflated and used to pull the arterial plug back into the graft. This was performed several times. Attempts were made to aspirate the arterial plug which was unsuccessful. The 5 French catheter was reintroduced into the brachial artery. Contrast injection demonstrates patency of the arterial anastomosis but there is still no significant forward flow into the fistula. Therefore, a 4 x 40 mm mustang balloon was advanced across the arterial anastomosis in this was angioplastied to 4 mm. The balloon was then used to angioplasty the proximal aspect of the graft. Follow-up contrast injection demonstrates a persistent occlusive thrombus positioned in the mid aspect of the graft. Therefore, the 7 x 100 mm balloon was reintroduced and used to angioplasty the central aspect of the graft. Following this, repeat venography demonstrates wide patency of the graft with brisk flow. Repeat venography was also performed centrally demonstrating continued adequate flow and no evidence of residual stenosis. IMPRESSION: 1. Completely thrombosed left upper extremity brachiocephalic arteriovenous graft/fistula. 2. Thrombosis  appears to be due to a high-grade stenosis of approximately 60% at the graft to cephalic venous anastomosis. 3. Successful declot procedure including pharmacological and mechanical thrombectomy. 4. Successful angioplasty of venous anastomotic stenosis with less than 5% residual stenosis. ACCESS: This access remains amenable to future percutaneous interventions as clinically indicated. Electronically Signed   By: Fernando Hoyer M.D.   On: 04/12/2024 16:32   DG CHEST PORT 1 VIEW Result Date: 04/12/2024 CLINICAL DATA:  Follow-up pleural effusions and probable left lower lobe pneumonia. EXAM: PORTABLE CHEST 1 VIEW COMPARISON:  Portable chest and chest, abdomen and pelvis CT dated 04/10/2024. Post thoracentesis portable chest dated 04/11/2024. FINDINGS: Stable enlarged cardiac silhouette. Stable small bilateral pleural effusions. Interval patchy density in the right lower lung zone and mildly progressive patchy density in the left lower lung zone. No pneumothorax. Tortuous and partially calcified thoracic aorta. Thoracolumbar spine degenerative changes. IMPRESSION: 1. Stable small bilateral pleural effusions. 2. Interval patchy density in the right lower lung zone and  mildly progressive patchy density in the left lower lung zone, which could represent a combination of atelectasis, pneumonia and alveolar edema. 3. Stable cardiomegaly. Electronically Signed   By: Catherin Closs M.D.   On: 04/12/2024 11:59   DG CHEST PORT 1 VIEW Result Date: 04/11/2024 CLINICAL DATA:  Status post thoracentesis. EXAM: PORTABLE CHEST 1 VIEW COMPARISON:  04/10/2024. FINDINGS: Prominent cardiac silhouette. Small bilateral pleural effusions. No pneumothorax. Normal pulmonary vasculature. Calcified aorta. Thoracic degenerative disc disease. IMPRESSION: Small bilateral pleural effusions. No pneumothorax. Electronically Signed   By: Sydell Eva M.D.   On: 04/11/2024 13:33   Medications:  cefTRIAXone  (ROCEPHIN )  IV 2 g (04/13/24 0640)    norepinephrine  (LEVOPHED ) Adult infusion Stopped (04/11/24 0609)    atorvastatin   10 mg Oral Daily   calcitRIOL  0.25 mcg Oral Q T,Th,Sa-HD   Chlorhexidine  Gluconate Cloth  6 each Topical Daily   cosyntropin  0.25 mg Intravenous Once   darbepoetin (ARANESP ) injection - DIALYSIS  200 mcg Subcutaneous Q Tue-1800   doxycycline  100 mg Oral Q12H   feeding supplement  237 mL Oral BID BM   ferric citrate   420 mg Oral TID WC   insulin  aspart  0-6 Units Subcutaneous TID WC   mupirocin ointment  1 Application Nasal BID   naphazoline-pheniramine  1 drop Right Eye BID   pantoprazole  (PROTONIX ) IV  40 mg Intravenous Q24H     Physical Exam General: Chronically ill appearing female in NAD Head: Normocephalic, bruised face, R eye with slight conjunctival hemorrhage  Neck: Supple. JVD not elevated. Lungs: Decreased in bases CTAB Heart: S1,S2 No M/R/G Abdomen: NABS, NT Lower extremities:Trace-1+ BLE, Wound RLE Foam drsg R knee/R ankle Neuro: Alert and oriented X 3. Moves all extremities spontaneously. Psych:  Responds to questions appropriately with a normal affect. Dialysis Access: L AVG  No bruit   Dialysis Orders: Center: [facility] T,Th,S  3.5 hrs 180NRe 400/Autoflow 1.5 70 kg 2.0 K/2.5 Ca - No heparin  - Calcitriol 0.25 mcg PO three times per week - Mircera 225 mcg IV Q 2 weeks (Last dose 03/28/2024 due 04/11/2024)   Assessment/Plan:  Shock: 2/2 PNA Management per PCCM. Off pressors.  R Pleural effusion: PCCM doing thoracentesis at bedside.Yield 1.4 liters Per PCCM. Still with O2 requirements.   ESRD -  T,Th,S. AVG clotted 04/11/2024. HD 04/12/2024 and short HD tomorrow to resume T,Th,S schedule 04/13/2024  Hypotension/volume  - BP stable off pressors. BLE edema. She has been leaving above OP EDW. Very much above EDW per weights if accurate. Will attempt to lower EDW as tolerated.   Anemia  - HGB 10.7 Low Tsat. Start Fe load. Max ESA dose today.   Metabolic bone disease -  Continue Auryxia   binder, VDRA   Nutrition - Albumin  2.6. FFT, add protein supplements  DMT2- per primary  9.    FFT-OT, PT needed-   [place] Greb H. Marguita Venning NP-C 04/13/2024, 8:41 AM  [employer] [phone]

## 2024-04-13 NOTE — Progress Notes (Addendum)
 HD#3 Subjective:   Summary: Anna Carr is a 83 y.o. 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   Interval Events: Patient had her IVF declotted and got dialyzed. This morning she feels better. She has no new concerns and wanted to know when she could go home. She has no issues with her AVF site .  Objective:  Vital signs in last 24 hours: Vitals:   04/13/24 0559 04/13/24 0648 04/13/24 0820 04/13/24 1142  BP: (!) 143/76  (!) 126/48 (!) 137/49  Pulse: 95 93 91 92  Resp: 20  18 20   Temp: 97.9 F (36.6 C)  97.8 F (36.6 C) 98.4 F (36.9 C)  TempSrc: Oral  Oral Oral  SpO2: 93% 92% 97% 93%  Weight: 74.1 kg     Height:       Supplemental O2: Room Air (nasal cannula in place during exam but O2 flow at 0 L/min) SpO2: 93 % O2 Flow Rate (L/min): 4 L/min   Physical Exam:  Constitutional: Chronically ill but non-toxic appearing older woman lying comfortably in bed in no acute distress.. CV: Regular rate and rhythm. Palpable thrill or bruit on auscultation. Pulm: Nasal cannula in place with 0 L O2. Normal work of breathing on room air. Intermittent non-productive cough. Decreased breath sounds at lung bases bilaterally. No adventitious lung sounds appreciated. Neuro: Alert and responding appropriately. Psych: Normal mood and affect.  Labs:    Latest Ref Rng & Units 04/13/2024    9:41 AM 04/12/2024    9:37 PM 04/12/2024    4:16 AM  CBC  WBC 4.0 - 10.5 K/uL  7.6  8.8   Hemoglobin 12.0 - 15.0 g/dL 9.7  16.1  09.6   Hematocrit 36.0 - 46.0 % 30.3  35.4  33.6   Platelets 150 - 400 K/uL  156  147        Latest Ref Rng & Units 04/12/2024    9:37 PM 04/12/2024    4:16 AM 04/11/2024    8:37 PM  BMP  Glucose 70 - 99 mg/dL 045  409  811    914   BUN 8 - 23  mg/dL 85  77  76    77   Creatinine 0.44 - 1.00 mg/dL 7.82  9.56  2.13    0.86   Sodium 135 - 145 mmol/L 125  126  125    125   Potassium 3.5 - 5.1 mmol/L 5.2  4.9  4.7    4.8   Chloride 98 - 111 mmol/L 87  89  85    85   CO2 22 - 32 mmol/L 19  19  22    21    Calcium  8.9 - 10.3 mg/dL 9.2  8.6  9.0    9.1      Assessment/Plan:   Anna Carr is a 83 y.o. 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 Pneumonia #Exudative? Pleural Effusion #Bilateral pleural effusions on CXR #Acute hypoxic respiratory failure, improving - Switch to Augmentin 500 mg BID / Doxycycline End date 6/20  - Weaned  to RA, Will supplement oxygen as needed   #Chronic anemia with baseline hemoglobin around 9 s/p transfusion x 1 #Hx of GI bleed and angiectasias Hgb stable at 9.7 this morning.  - Continue Ferric citrate  tablet 420 mg TID - Continue Darbepoetin Alfa  200 mcg SQ Tuesdays - Continue Protonix  40 mg IV daily  #ESRD on HD #L AVG with Clot #Chronic hyponatremia #HAGMA #Metabolic bone disease  - AVF declotted yesterday by IR - Dialysis Tuesdays, Thursdays and Saturdays  - Nephrology to dialysis again today   #Hypoproteinemia (5.5) #Hypoalbuminemia (2.5) - Ensure feeding supplement 237 mL 2 times daily between meal    #R Eye Blurry Vision - Consider outpatient ophthalmology consult after discharge   Chronic Stable Conditions #Insulin -dependent diabetes mellitus Patient currently NPO.  - Insulin  aspart 0-6 SQ TID with meals - Monitor CBGs   #Hyperlipidemia/CAD (LDL 82 in 03/25) - Continue atorvastatin    #HTN/HFpEF - Continue holding home Coreg  and Lasix   Resolved #Elevated bilirubin, resolved  Diet: NPO in  anticipation of IR intervention IVF: None,None VTE: SCDs Code: Full PT/OT recs: Home Health, wheelchair. Family Update: Called daughter 04/12/24 0900.  Dispo: Anticipated discharge to Home in 2 days pending medical stabilization and IV antibiotic treatment.    Anna Hoop, MD 04/13/2024, 12:53 PM

## 2024-04-13 NOTE — Progress Notes (Signed)
 Occupational Therapy Treatment Patient Details Name: Anna Carr MRN: [id number] DOB: October 19, 1941 Today's Date: 04/13/2024   History of present illness Pt is an 83 y/o F presenting to ED on 6/16 wtih possible GIB after fall, dark stools, admittedf or AMS and shock. Recently here 1 month ago for similar. PMH includes ESRD on HD, DM2, nonrheumatic mitral valve regurgitation, HFpEF, advanced PVD, anemia of chronic disease, duodenal/jejunal angioectasais, small bowel enteroscopy on 5/3 complicated by bradycardia, VCE then repeat enteroscopy on 5/8 which complicated by hypoxia and bradycardia and asystole requriing CPR with rapid ROSC.   OT comments  Patient received in supine and asking to use BSC. Patient was supervision to get to EOB and CGA for transfer to [facility] with RW and cues for safety and hand placement. Patient performed toilet hygiene seated and required assistance with complete in standing. Patient performed hand hygiene following and asked to return to bed due to fatigue from early HD and  lack of sleep. Acute OT to continue to follow to address established goals.       If plan is discharge home, recommend the following:  A little help with walking and/or transfers;A little help with bathing/dressing/bathroom;Assistance with cooking/housework;Direct supervision/assist for medications management;Direct supervision/assist for financial management;Assist for transportation;Help with stairs or ramp for entrance   Equipment Recommendations  Hospital bed (with air mattress overlay)    Recommendations for Other Services      Precautions / Restrictions Precautions Precautions: Fall Restrictions Weight Bearing Restrictions Per Provider Order: No       Mobility Bed Mobility Overal bed mobility: Needs Assistance Bed Mobility: Supine to Sit, Sit to Supine     Supine to sit: Supervision Sit to supine: Min assist   General bed mobility comments: able able to get to EOB without  assistance and patient attempted to return to supine without assistance but required min assist with BLEs    Transfers Overall transfer level: Needs assistance Equipment used: Rolling walker (2 wheels) Transfers: Sit to/from Stand, Bed to chair/wheelchair/BSC Sit to Stand: Contact guard assist     Step pivot transfers: Contact guard assist     General transfer comment: CGA and verbal cues for safety and hand placement for transfer to [facility] and EOB     Balance Overall balance assessment: Needs assistance Sitting-balance support: Feet supported Sitting balance-Leahy Scale: Fair     Standing balance support: Single extremity supported, Bilateral upper extremity supported, During functional activity Standing balance-Leahy Scale: Poor Standing balance comment: reliant on UE support when standing                           ADL either performed or assessed with clinical judgement   ADL Overall ADL's : Needs assistance/impaired Eating/Feeding: Set up;Sitting   Grooming: Wash/dry hands;Wash/dry face;Set up;Sitting Grooming Details (indicate cue type and reason): on EOB                 Toilet Transfer: Contact guard assist;BSC/3in1;Rolling walker (2 wheels) Toilet Transfer Details (indicate cue type and reason): cues for safety Toileting- Clothing Manipulation and Hygiene: Moderate assistance;Sit to/from stand Toileting - Clothing Manipulation Details (indicate cue type and reason): patient performed toilet hygiene seated and required assistance to complete in standing            Extremity/Trunk Assessment              Vision       Perception     Praxis  Communication Communication Communication: Impaired Factors Affecting Communication: Hearing impaired   Cognition Arousal: Alert Behavior During Therapy: WFL for tasks assessed/performed               OT - Cognition Comments: patient slightly agitated over difficulty getting sleep in  hospital                 Following commands: Intact        Cueing   Cueing Techniques: Verbal cues  Exercises      Shoulder Instructions       General Comments on RA with SpO2 85% during toileting and 90's when supine    Pertinent Vitals/ Pain       Pain Assessment Pain Assessment: Faces Faces Pain Scale: Hurts a little bit Pain Location: abdomin Pain Descriptors / Indicators: Discomfort, Grimacing Pain Intervention(s): Limited activity within patient's tolerance, Repositioned  Home Living                                          Prior Functioning/Environment              Frequency  Min 2X/week        Progress Toward Goals  OT Goals(current goals can now be found in the care plan section)  Progress towards OT goals: Progressing toward goals  Acute Rehab OT Goals Patient Stated Goal: to rest OT Goal Formulation: With patient Time For Goal Achievement: 04/25/24 Potential to Achieve Goals: Good ADL Goals Pt Will Perform Upper Body Dressing: with supervision;sitting Pt Will Perform Lower Body Dressing: with min assist;sitting/lateral leans;sit to/from stand Pt Will Transfer to Toilet: with min assist;ambulating;regular height toilet Additional ADL Goal #1: pt will perform bed mobility with supervision in prep for ADLs  Plan      Co-evaluation                 AM-PAC OT 6 Clicks Daily Activity     Outcome Measure   Help from another person eating meals?: A Little Help from another person taking care of personal grooming?: A Little Help from another person toileting, which includes using toliet, bedpan, or urinal?: A Lot Help from another person bathing (including washing, rinsing, drying)?: A Lot Help from another person to put on and taking off regular upper body clothing?: A Little Help from another person to put on and taking off regular lower body clothing?: A Lot 6 Click Score: 15    End of Session Equipment  Utilized During Treatment: Rolling walker (2 wheels)  OT Visit Diagnosis: Unsteadiness on feet (R26.81);Other abnormalities of gait and mobility (R26.89);Muscle weakness (generalized) (M62.81)   Activity Tolerance Patient limited by fatigue   Patient Left in bed;with call bell/phone within reach;with bed alarm set   Nurse Communication Mobility status        Time: 1478-2956 OT Time Calculation (min): 22 min  Charges: OT General Charges $OT Visit: 1 Visit OT Treatments $Self Care/Home Management : 8-22 mins  Anitra Barn, OTA Acute Rehabilitation Services  Office [phone]   Jovita Nipper 04/13/2024, 11:48 AM

## 2024-04-13 NOTE — TOC Initial Note (Signed)
 Transition of Care [facility]) - Initial/Assessment Note    Patient Details  Name: Anna Carr MRN: [id number] Date of Birth: 03-25-1941  Transition of Care [facility]) CM/SW Contact:    Cosimo Diones, RN Phone Number: 04/13/2024, 12:04 PM  Clinical Narrative:  Patient presented for hypotension and sepsis. Hx ESRD- TTS schedule. PTA patient reports that she was from home with her son. Patient has PCP and states she gets to appointments without any issues. Patient has DME nebulizer Machine, wheelchair, rolling walker, and cane. Patient is currently active with [facility] for RN/PT/OT/ CSW and will need orders once stable with F2F. Patient states she will have transportation home once stable. No further needs identified at this time.                 Expected Discharge Plan: Home w Home Health Services Barriers to Discharge: No Barriers Identified   Patient Goals and CMS Choice Patient states their goals for this hospitalization and ongoing recovery are:: patient wants to return home with family support.   Choice offered to / list presented to :  (Active with [facility])      Expected Discharge Plan and Services In-house Referral: NA Discharge Planning Services: CM Consult Post Acute Care Choice: Resumption of Svcs/PTA Provider, Home Health Living arrangements for the past 2 months: Single Family Home                   DME Agency: NA       HH Arranged: RN, Disease Management, PT, OT, Social Work [employer] Agency: [facility] Date [facility] Agency Contacted: 04/13/24 Time HH Agency Contacted: 1203 Representative spoke with at [facility] Agency: Rexene Catching  Prior Living Arrangements/Services Living arrangements for the past 2 months: Single Family Home Lives with:: Adult Children Patient language and need for interpreter reviewed:: Yes Do you feel safe going back to the place where you live?: Yes      Need for Family Participation in Patient Care:  No (Comment) Care giver support system in place?: Yes (comment) Current home services: DME (wheelchair, nebulizer machine, rolling walker, and cane.) Criminal Activity/Legal Involvement Pertinent to Current Situation/Hospitalization: No - Comment as needed  Activities of Daily Living   ADL Screening (condition at time of admission) Independently performs ADLs?: Yes (appropriate for developmental age) Is the patient deaf or have difficulty hearing?: Yes Does the patient have difficulty seeing, even when wearing glasses/contacts?: Yes Does the patient have difficulty concentrating, remembering, or making decisions?: No  Permission Sought/Granted Permission sought to share information with : Family Supports, Case Production designer, theatre/television/film, Oceanographer granted to share information with : Yes, Verbal Permission Granted     Permission granted to share info w AGENCY: [facility]        Emotional Assessment Appearance:: Appears stated age Attitude/Demeanor/Rapport: Engaged Affect (typically observed): Appropriate Orientation: : Oriented to Self, Oriented to Place, Oriented to  Time, Oriented to Situation Alcohol  / Substance Use: Not Applicable Psych Involvement: No (comment)  Admission diagnosis:  Shock, unspecified ([name]) [R57.9] Hypotension, unspecified hypotension type [I95.9] Gastrointestinal hemorrhage, unspecified gastrointestinal hemorrhage type [K92.2] Patient Active Problem List   Diagnosis Date Noted   Pressure injury of skin 04/12/2024   Shock, unspecified ([name]) 04/10/2024   AVM (arteriovenous malformation)    History of blood transfusion    Pneumonia    Acute pain of left shoulder 03/05/2024   Musculoskeletal chest pain 03/03/2024   AVM (arteriovenous malformation) of duodenum, acquired  03/03/2024   Acute upper GI bleed 03/03/2024   Cardiac arrest ([name]) 03/02/2024   Symptomatic anemia 02/28/2024   Acute on chronic anemia 02/25/2024    Angiodysplasia of small intestine 02/25/2024   Peripheral vascular disease, unspecified ([name]) critical stenosis lower extremity 12/01/2023   Aortic stenosis moderate 2024 06/21/2023   Pulmonary nodule 06/09/2023   AVM (arteriovenous malformation) of small bowel, acquired with hemorrhage    Anemia due to chronic blood loss    GI bleed 12/19/2021   Mitral regurgitation moderate to severe based on echocardiogram from 2022 11/03/2021   Pulmonary hypertension, unspecified ([name]) 11/03/2021   AVM (arteriovenous malformation) of duodenum, acquired with hemorrhage 10/30/2021   Cholelithiasis 03/30/2021   Heme positive stool    Elevated troponin 03/16/2021   Cellulitis 03/16/2021   COVID-19 10/29/2020   Contact with and (suspected) exposure to covid-19 10/28/2020   Cough, unspecified 10/28/2020   PAT (paroxysmal atrial tachycardia) ([name]) 10/04/2020   AV fistula ([name])    COPD mixed type ([name])    Depression    Dyspnea on exertion    Hypertension    Osteoarthritis    Moderate protein-calorie malnutrition ([name]) 03/26/2020   Complication of vascular dialysis catheter 03/05/2020   Fever, unspecified 01/23/2020   Diverticulosis of large intestine without perforation or abscess without bleeding 01/08/2020   History of colonic polyps 01/08/2020   Other disorders of phosphorus metabolism 01/08/2020   Unspecified hemorrhoids 01/08/2020   ESRD on hemodialysis ([name]) 01/02/2020   GIB (gastrointestinal bleeding) 01/02/2020   Encounter for removal of sutures 12/28/2019   Coagulation defect, unspecified ([name]) 12/07/2019   Encounter for screening for respiratory tuberculosis 12/06/2019   Iron  deficiency anemia, unspecified 12/06/2019   Pruritus, unspecified 12/06/2019   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/2019   Dyspnea, unspecified 11/02/2019   ESRD on dialysis ([name]) 09/18/2019   ESRD (end stage renal disease) ([name]) 07/18/2019   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/2019   Hyponatremia 03/11/2019   Hypoxia 03/11/2019   Hypokalemia 03/11/2019   Type 2 diabetes mellitus, with long-term current use of insulin  ([name]) 03/11/2019   Chronic laryngitis 02/17/2017   CHF with left ventricular diastolic dysfunction, NYHA class 1 ([name]) 02/02/2017   Anemia in chronic kidney disease 01/23/2017   Anemia, chronic disease 01/23/2017   Type 2 diabetes mellitus with chronic kidney disease on chronic dialysis, with long-term current use of insulin  ([name]) 12/23/2015   Chronic constipation 12/22/2015   Colon polyp 12/22/2015   High risk medication use 12/22/2015   HLD (hyperlipidemia) 12/22/2015   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   Osteopenia 12/22/2015   Insulin  long-term use ([name]) 12/22/2015   Insomnia 12/22/2015   Major depressive disorder, recurrent ([name]) 12/22/2015   PCP:  Abbe Hoard., MD Pharmacy:   [facility] [address] [place], [place] - [address] [address] [place] [address] Phone: [phone] Fax: [phone]  [facility] [address] [place] [address] Phone: [phone] Fax: [phone]     Social Drivers of Health (SDOH) Social History: SDOH Screenings   Food Insecurity: No Food Insecurity (04/10/2024)  Housing: Low Risk  (04/10/2024)  Transportation Needs: No Transportation Needs (04/10/2024)  Utilities: Not At Risk (04/10/2024)  Financial Resource Strain: Low Risk  (10/27/2021)   Received from [facility]  Social Connections: Socially Isolated (04/10/2024)  Stress: No Stress Concern Present (10/27/2021)   Received from [facility]  Tobacco Use: Medium Risk (04/10/2024)  SDOH Interventions: Transportation Interventions: Inpatient TOC, Patient Resources (Friends/Family), Intervention Not Indicated   Readmission Risk Interventions    04/11/2024    2:26 PM  Readmission Risk Prevention Plan  Transportation Screening Complete  PCP or Specialist Appt within  5-7 Days Complete  Home Care Screening Complete  Medication Review (RN CM) Referral to Pharmacy

## 2024-04-13 NOTE — Progress Notes (Signed)
 Mobility Specialist Progress Note;   04/13/24 1021  Mobility  Activity Transferred to/from [facility]  Level of Assistance Minimal assist, patient does 75% or more  Assistive Device None  Distance Ambulated (ft) 3 ft  Activity Response Tolerated fair  Mobility Referral Yes  Mobility visit 1 Mobility  Mobility Specialist Start Time (ACUTE ONLY) 1021  Mobility Specialist Stop Time (ACUTE ONLY) 1029  Mobility Specialist Time Calculation (min) (ACUTE ONLY) 8 min   Pt requesting assistance to [facility]. Required MinA to safely transfer pt to and from [facility]. VSS throughout. C/o feeling fatigued, otherwise asx. Pt left comfortably in bed with all needs met, call bell in reach.   Janit Meline Mobility Specialist Please contact via SecureChat or [employer] [phone]

## 2024-04-13 NOTE — Progress Notes (Signed)
 Physical Therapy Treatment Patient Details Name: Anna Carr MRN: [id number] DOB: July 29, 1941 Today's Date: 04/13/2024   History of Present Illness Pt is an 83 y/o F presenting to ED on 6/16 wtih possible GIB after fall, dark stools, admittedf or AMS and shock. Recently here 1 month ago for similar. PMH includes ESRD on HD, DM2, nonrheumatic mitral valve regurgitation, HFpEF, advanced PVD, anemia of chronic disease, duodenal/jejunal angioectasais, small bowel enteroscopy on 5/3 complicated by bradycardia, VCE then repeat enteroscopy on 5/8 which complicated by hypoxia and bradycardia and asystole requriing CPR with rapid ROSC.    PT Comments  Pt greeted supine in bed, pleasant and agreeable to PT session. She engaged in gait training using RW. Pt ambulated ~12ft with CGA. She had no LOB, but reported fatigue and general weakness. Weaned pt to RA prior to mobility and she maintained SpO2 >90% with activity. Upon returning to supine in bed with HOB flat and following a coughing fit pt desaturated to 86-87% SpO2 and 2L O2 were donned by RN. Her primarily limiting factor is cardiopulmonary endurance. Will continue to follow acutely and advance appropriately.      If plan is discharge home, recommend the following: A little help with walking and/or transfers;A little help with bathing/dressing/bathroom;Assistance with cooking/housework;Direct supervision/assist for medications management;Direct supervision/assist for financial management;Assist for transportation;Help with stairs or ramp for entrance   Can travel by private vehicle        Equipment Recommendations  Hospital bed (Air mattress overlay for pressure injuries)    Recommendations for Other Services       Precautions / Restrictions Precautions Precautions: Fall Recall of Precautions/Restrictions: Intact Precaution/Restrictions Comments: watch SpO2 Restrictions Weight Bearing Restrictions Per Provider Order: No     Mobility   Bed Mobility Overal bed mobility: Needs Assistance Bed Mobility: Supine to Sit, Sit to Supine     Supine to sit: Supervision Sit to supine: Supervision   General bed mobility comments: Pt peformed supine<>sit on the L side of the bed with increased time. No physical assist or cues for sequencing. HOB flat. Supervision for safety and line management.    Transfers Overall transfer level: Needs assistance Equipment used: Rolling walker (2 wheels) Transfers: Sit to/from Stand Sit to Stand: Contact guard assist           General transfer comment: Pt stood from lowest bed height. Cues for proper hand placement on RW. Powered up with CGA. Good eccentric control with sitting.    Ambulation/Gait Ambulation/Gait assistance: Contact guard assist Gait Distance (Feet): 100 Feet Assistive device: Rolling walker (2 wheels) Gait Pattern/deviations: Step-through pattern, Decreased stride length, Trunk flexed Gait velocity: decr Gait velocity interpretation: <1.31 ft/sec, indicative of household ambulator   General Gait Details: Pt ambulated with a reciprocal gait pattern, even weight shift, and good foot clearence. She maintained body inside RW at all times and manuever within room/hallway well. She c/o general weakness and fatigue throughout.   Stairs             Wheelchair Mobility     Tilt Bed    Modified Rankin (Stroke Patients Only)       Balance Overall balance assessment: Needs assistance Sitting-balance support: Feet supported Sitting balance-Leahy Scale: Fair Sitting balance - Comments: Pt sat EOB with supervision   Standing balance support: Bilateral upper extremity supported, During functional activity, Reliant on assistive device for balance Standing balance-Leahy Scale: Poor Standing balance comment: Pt dependent on RW and CGA for stability.  Communication Communication Communication: Impaired Factors Affecting  Communication: Hearing impaired  Cognition Arousal: Alert Behavior During Therapy: WFL for tasks assessed/performed   PT - Cognitive impairments: No apparent impairments                         Following commands: Intact      Cueing Cueing Techniques: Verbal cues  Exercises      General Comments General comments (skin integrity, edema, etc.): Pt greeted on 4L O2 via [place], weaned to RA and pt maintained SpO2 >90% throughout mobility. Upon returning to supine in bed and coughing pt desaturated to 86-87% SpO2. RN entered and reapplied 2L O2 with SpO2 100%.      Pertinent Vitals/Pain Pain Assessment Pain Assessment: Faces Faces Pain Scale: Hurts little more Pain Location: Generalized Pain Descriptors / Indicators: Discomfort, Aching, Sore Pain Intervention(s): Limited activity within patient's tolerance, Monitored during session    Home Living                          Prior Function            PT Goals (current goals can now be found in the care plan section) Acute Rehab PT Goals Patient Stated Goal: Feel stronger and move independently. Progress towards PT goals: Progressing toward goals    Frequency    Min 2X/week      PT Plan      Co-evaluation              AM-PAC PT 6 Clicks Mobility   Outcome Measure  Help needed turning from your back to your side while in a flat bed without using bedrails?: None Help needed moving from lying on your back to sitting on the side of a flat bed without using bedrails?: A Little Help needed moving to and from a bed to a chair (including a wheelchair)?: A Little Help needed standing up from a chair using your arms (e.g., wheelchair or bedside chair)?: A Little Help needed to walk in hospital room?: A Little Help needed climbing 3-5 steps with a railing? : A Lot 6 Click Score: 18    End of Session Equipment Utilized During Treatment: Gait belt;Oxygen Activity Tolerance: Patient tolerated treatment  well Patient left: in bed;with call bell/phone within reach;with bed alarm set Nurse Communication: Mobility status PT Visit Diagnosis: Unsteadiness on feet (R26.81);Other abnormalities of gait and mobility (R26.89);Muscle weakness (generalized) (M62.81);Difficulty in walking, not elsewhere classified (R26.2);Pain     Time: 1256-1316 PT Time Calculation (min) (ACUTE ONLY): 20 min  Charges:    $Gait Training: 8-22 mins PT General Charges $$ ACUTE PT VISIT: 1 Visit                     Glenford Lanes, PT, DPT Acute Rehabilitation Services Office: [phone] Secure Chat Preferred  Riva Chester 04/13/2024, 1:26 PM

## 2024-04-13 NOTE — Procedures (Signed)
 Received patient in bed to unit.  Alert and oriented.  Informed consent signed and in chart.   TX duration: 2.5 hours  Patient tolerated well.  Transported back to the room  Alert, without acute distress.  Hand-off given to patient's nurse.   Access used: left graft Access issues: none  Total UF removed: 2 liters Medication(s) given: calcitriol  Clover Dao, RN [facility]

## 2024-04-14 ENCOUNTER — Other Ambulatory Visit (HOSPITAL_COMMUNITY): Payer: Self-pay

## 2024-04-14 LAB — BODY FLUID CULTURE W GRAM STAIN
Culture: NO GROWTH
Gram Stain: NONE SEEN

## 2024-04-14 LAB — CBC
HCT: 31.1 % — ABNORMAL LOW (ref 36.0–46.0)
Hemoglobin: 9.8 g/dL — ABNORMAL LOW (ref 12.0–15.0)
MCH: 31.7 pg (ref 26.0–34.0)
MCHC: 31.5 g/dL (ref 30.0–36.0)
MCV: 100.6 fL — ABNORMAL HIGH (ref 80.0–100.0)
Platelets: 139 10*3/uL — ABNORMAL LOW (ref 150–400)
RBC: 3.09 MIL/uL — ABNORMAL LOW (ref 3.87–5.11)
RDW: 18 % — ABNORMAL HIGH (ref 11.5–15.5)
WBC: 5.4 10*3/uL (ref 4.0–10.5)
nRBC: 0 % (ref 0.0–0.2)

## 2024-04-14 LAB — GLUCOSE, CAPILLARY
Glucose-Capillary: 161 mg/dL — ABNORMAL HIGH (ref 70–99)
Glucose-Capillary: 176 mg/dL — ABNORMAL HIGH (ref 70–99)

## 2024-04-14 LAB — BASIC METABOLIC PANEL WITH GFR
Anion gap: 17 — ABNORMAL HIGH (ref 5–15)
BUN: 36 mg/dL — ABNORMAL HIGH (ref 8–23)
CO2: 24 mmol/L (ref 22–32)
Calcium: 8.8 mg/dL — ABNORMAL LOW (ref 8.9–10.3)
Chloride: 90 mmol/L — ABNORMAL LOW (ref 98–111)
Creatinine, Ser: 3.95 mg/dL — ABNORMAL HIGH (ref 0.44–1.00)
GFR, Estimated: 11 mL/min — ABNORMAL LOW (ref >60)
Glucose, Bld: 224 mg/dL — ABNORMAL HIGH (ref 70–99)
Potassium: 4 mmol/L (ref 3.5–5.1)
Sodium: 131 mmol/L — ABNORMAL LOW (ref 135–145)

## 2024-04-14 MED ORDER — DARBEPOETIN ALFA 200 MCG/0.4ML IJ SOSY: 200.0000 ug | PREFILLED_SYRINGE | Freq: Once | INTRAMUSCULAR | Status: DC

## 2024-04-14 MED ORDER — DARBEPOETIN ALFA 40 MCG/0.4ML IJ SOSY
40.0000 ug | PREFILLED_SYRINGE | Freq: Once | INTRAMUSCULAR | Status: AC
  Administered 2024-04-14: 40 ug via SUBCUTANEOUS
  Filled 2024-04-14: qty 0.4

## 2024-04-14 MED ORDER — DOXYCYCLINE HYCLATE 100 MG PO TABS
100.0000 mg | ORAL_TABLET | Freq: Two times a day (BID) | ORAL | 0 refills | Status: DC
Start: 2024-04-14 — End: 2024-04-19
  Filled 2024-04-14: qty 5, 3d supply, fill #0

## 2024-04-14 MED ORDER — SENNA 8.6 MG PO TABS
1.0000 | ORAL_TABLET | Freq: Every day | ORAL | 0 refills | Status: DC | PRN
  Filled 2024-04-14: qty 120, 120d supply, fill #0

## 2024-04-14 MED ORDER — HYDROCORTISONE 1 % EX OINT
1.0000 | TOPICAL_OINTMENT | Freq: Two times a day (BID) | CUTANEOUS | 0 refills | Status: DC
  Filled 2024-04-14: qty 30, 10d supply, fill #0

## 2024-04-14 MED ORDER — DOXYCYCLINE HYCLATE 100 MG PO TABS
100.0000 mg | ORAL_TABLET | Freq: Two times a day (BID) | ORAL | 0 refills | Status: DC
  Filled 2024-04-14: qty 1, 1d supply, fill #0

## 2024-04-14 NOTE — Discharge Planning (Signed)
 [facility] Patient Discharge Orders- [facility] CLINIC:  [place]  Patient's name: Anna Carr Admit/DC Dates: 04/10/2024 - 04/14/2024  Discharge Diagnoses: Shock, unspecified   AVG clot CAP  Aranesp : Given: Yes   Date and amount of last dose: 40 mcg [place] 04/14/2024  PRBC's Given: No Date/# of units: NA Last Hgb: 9.8 ESA dose for discharge: mircera 150 mcg IV q 2 weeks  IV Iron  dose at discharge: Per protocol  Heparin  change: No  EDW Change: Yes New EDW: 71 kg  [facility] Change: No  Access intervention/Change: Yes Details: Declot of AVG Dr. Marne Sings IR 04/12/2024  Hectorol/Calcitriol change: no  Discharge Labs: Calcium  8.8 Phosphorus 3.7 Albumin  2.5 K+ 4.0  IV Antibiotics: No Details:  On Coumadin?: No Last INR: Next INR: Managed By:   OTHER/APPTS/LAB ORDERS:    D/C Meds to be reconciled by nurse after every discharge.  Completed By: Jacobo Masters [facility] [phone]    Reviewed by: MD:______ RN_______

## 2024-04-14 NOTE — Progress Notes (Signed)
 AVS given and reviewed with pt and daughter at bedside. TOC meds delivered to bedside and discussed with pt and daughter. All questions answered to satisfaction. Pt and daughter verbalized understanding of information given. Pt to be escorted off the unit with all belongings via wheelchair by staff member.

## 2024-04-14 NOTE — TOC Progression Note (Addendum)
 Transition of Care [facility]) - Progression Note    Patient Details  Name: Anna Carr MRN: [id number] Date of Birth: May 17, 1941  [facility] CM/SW Contact  Ronni Colace, RN Phone Number: 04/14/2024, 11:48 AM  Clinical Narrative:    Walking test done, qualifies for 2LPM. Called [employer] for oxygen. Home health orders in , [facility] has already accepted TOC will continue to follow 1240- need to amend oxygen order and cosign walking saturations MD messaged regarding this, the patient cannot receive the oxygen until this is done in the sytme 1420 [employer] texted this RNCM stating that the note for walk test needs to be placed by MD or countersigned, messaged provider. 1430  found that the incorrect walk test was placed in DC summary Messaged and spoke to [employer], messaged exact wording to provider and asked them to write a separate note.  1445 Awaiting note so the paitent can receive oxygen and be discharged [facility] health aware of  patient DC 1520 Addendum placed oxygen being  expidiated to patient 1730  called [employer] as the oxygen has not arrived yet, spoke with Mitch at [employer]  he will check on this right away and message this RNCM back 1744 oxygen was delivered to bedside.  Expected Discharge Plan: Home w Home Health Services Barriers to Discharge: No Barriers Identified  Expected Discharge Plan and Services In-house Referral: NA Discharge Planning Services: CM Consult Post Acute Care Choice: Resumption of Svcs/PTA Provider, Home Health Living arrangements for the past 2 months: Single Family Home                 DME Arranged: Oxygen DME Agency: [employer] Date DME Agency Contacted: 04/14/24 Time DME Agency Contacted: 1148 Representative spoke with at DME Agency: Gladys Lamp HH Arranged: RN, Disease Management, PT, OT, Social Work [employer] Agency: [facility] Date [facility] Agency Contacted: 04/13/24 Time HH Agency Contacted: 1203 Representative spoke  with at [facility] Agency: Rexene Catching   Social Determinants of Health (SDOH) Interventions SDOH Screenings   Food Insecurity: No Food Insecurity (04/10/2024)  Housing: Low Risk  (04/10/2024)  Transportation Needs: No Transportation Needs (04/10/2024)  Utilities: Not At Risk (04/10/2024)  Financial Resource Strain: Low Risk  (10/27/2021)   Received from [facility]  Social Connections: Socially Isolated (04/10/2024)  Stress: No Stress Concern Present (10/27/2021)   Received from [facility]  Tobacco Use: Medium Risk (04/10/2024)    Readmission Risk Interventions    04/11/2024    2:26 PM  Readmission Risk Prevention Plan  Transportation Screening Complete  PCP or Specialist Appt within 5-7 Days Complete  Home Care Screening Complete  Medication Review (RN CM) Referral to Pharmacy

## 2024-04-14 NOTE — Progress Notes (Signed)
 DME O2 delivered to bedside.  Pt escorted off unit via wheelchair with all belongings.

## 2024-04-14 NOTE — Progress Notes (Signed)
 I was called to the bedside by the patient's daughter regarding a rash on the patient's left leg. On examination, there was a mild, dry rash located on the left lower leg just above the ankle. The rash did not appear infected. The findings are suggestive of age-related skin changes rather than irritation from her right sock. Given concern for cellulitis, the patient has been receiving doxycycline and azithromycin for community-acquired pneumonia (CAP). The plan is to extend doxycycline for an additional 2 days after discharge.  - Continue doxycycline 100 mg twice daily until 04/16/2024.  Richrd Char MD 04/14/2024, 3:45 PM

## 2024-04-14 NOTE — Progress Notes (Signed)
 D/C order noted. Contacted [facility] to be advised of pt's d/c today and that pt should resume care tomorrow.   Olivia Canter Renal Navigator [phone]

## 2024-04-14 NOTE — Care Management Important Message (Signed)
 Important Message  Patient Details  Name: Anna Carr MRN: [id number] Date of Birth: [birth year]   Important Message Given:  Yes - Medicare IM     Janith Melnick 04/14/2024, 11:25 AM

## 2024-04-14 NOTE — Progress Notes (Signed)
 Updated Ambulatory Oxygen saturations  Nurse requested Mobility Specialist to perform oxygen saturation test with pt which includes removing pt from oxygen both at rest and while ambulating. Below are the results from that testing.   Patient Saturations on Room Air at Rest = spO2 94%  Patient Saturations on Room Air while Ambulating = sp02 81% .   Patient Saturations on 2 Liters of oxygen while Ambulating = sp02 95%  At end of testing pt left in room on 0 Liters of oxygen.

## 2024-04-14 NOTE — Progress Notes (Addendum)
 [facility] Progress Note   Subjective: Up in chair on RA says she is going home today but noted new cellulitis R foot. On Augmentin/Doxy. AVG +T/B but bruised.  Objective Vitals:   04/13/24 2325 04/14/24 0335 04/14/24 0719 04/14/24 0745  BP: (!) 136/57 111/64  128/69  Pulse: 93 99  80  Resp: 16 16  18   Temp: 98.3 F (36.8 C) 98.4 F (36.9 C)  97.6 F (36.4 C)  TempSrc: Oral Oral  Axillary  SpO2: 94% 92% 97%   Weight:  73.1 kg    Height:        Additional Objective Labs: Basic Metabolic Panel: Recent Labs  Lab 04/11/24 2037 04/12/24 0416 04/12/24 2137 04/13/24 1258  NA 125*  125* 126* 125* 130*  K 4.8  4.7 4.9 5.2* 4.3  CL 85*  85* 89* 87* 90*  CO2 21*  22 19* 19* 23  GLUCOSE 223*  224* 184* 191* 236*  BUN 77*  76* 77* 85* 44*  CREATININE 6.29*  6.11* 6.42* 6.79* 4.44*  CALCIUM  9.1  9.0 8.6* 9.2 8.6*  PHOS 5.1*  --  5.4* 3.7   Liver Function Tests: Recent Labs  Lab 04/10/24 1106 04/11/24 2037 04/12/24 0416 04/12/24 2137 04/13/24 1258  AST 26 28 21   --   --   ALT 22 26 23   --   --   ALKPHOS 126 146* 124  --   --   BILITOT 1.3* 1.2 1.2  --   --   PROT 5.8* 6.1* 5.5*  --   --   ALBUMIN  2.6* 2.7*  2.7* 2.5* 2.8* 2.5*   No results for input(s): LIPASE, AMYLASE in the last 168 hours. CBC: Recent Labs  Lab 04/10/24 1106 04/10/24 1112 04/11/24 2037 04/12/24 0416 04/12/24 2137 04/13/24 0941 04/13/24 1258 04/14/24 0354  WBC 9.0  --  10.2 8.8 7.6  --  6.2 5.4  NEUTROABS 8.1*  --   --   --  6.4  --   --   --   HGB 8.7*   < > 11.4* 10.7* 11.5* 9.7* 9.8* 9.8*  HCT 28.1*   < > 36.0 33.6* 35.4* 30.3* 30.9* 31.1*  MCV 102.9*  --  97.8 96.6 96.2  --  97.2 100.6*  PLT 149*  --  164 147* 156  --  163 139*   < > = values in this interval not displayed.   Blood Culture    Component Value Date/Time   SDES PLEURAL 04/11/2024 1101   SPECREQUEST NONE 04/11/2024 1101   CULT  04/11/2024 1101    NO GROWTH 2 DAYS Performed at [facility], [address] [place], [place] [address]    REPTSTATUS PENDING 04/11/2024 1101    Cardiac Enzymes: No results for input(s): CKTOTAL, CKMB, CKMBINDEX, TROPONINI in the last 168 hours. CBG: Recent Labs  Lab 04/13/24 1010 04/13/24 1222 04/13/24 1823 04/13/24 2107 04/14/24 0822  GLUCAP 200* 241* 131* 191* 161*   Iron  Studies: No results for input(s): IRON , TIBC, TRANSFERRIN, FERRITIN in the last 72 hours. @lablastinr3 @ Studies/Results: IR THROMBECTOMY AV FISTULA W/THROMBOLYSIS/PTA INC/SHUNT/IMG LEFT Result Date: 04/12/2024 INDICATION: 83 year old female with end-stage renal disease on hemodialysis via a left upper extremity arteriovenous fistula which was partially repaired with a 4-7 mm straight graft replacing a portion of the draining cephalic vein. She comes in with thrombosis of the access. EXAM: IR ULTRASOUND GUIDANCE VASC ACCESS LEFT; IR THROMBECTOMY AV FISTULA W/THROMBOLYSIS/PTA INC/SHUNT/IMG LEFT MEDICATIONS: 5000 units heparin  given  intravenously by the Radiology nurse. 4 mg tPA administered into the fistula by me. ANESTHESIA/SEDATION: A total of 1.5 mg Versed  and 50 mcg fentanyl  was administered intravenously by the Radiology nurse. Moderate Sedation Time:  54 minutes The patient's vital signs and level of consciousness were continuously monitored during the procedure by the interventional radiology nurse under my direct supervision. FLUOROSCOPY TIME:  Fluoroscopy Time:  minutes  seconds ( mGy). COMPLICATIONS: None immediate. PROCEDURE: Informed written consent was obtained from the patient after a thorough discussion of the procedural risks, benefits and alternatives. All questions were addressed. Maximal Sterile Barrier Technique was utilized including caps, mask, sterile gowns, sterile gloves, sterile drape, hand hygiene and skin antiseptic. A timeout was performed prior to the initiation of the procedure. The proximal aspect of the graft was  interrogated with ultrasound and found to be thrombosed. An image was obtained and stored for the medical record. Local anesthesia was attained by infiltration with 1% lidocaine . A small dermatotomy was made. Under real-time sonographic guidance, the vessel was punctured in antegrade fashion with a 21 gauge micropuncture needle. Using standard technique, the initial micro needle was exchanged over a 0.018 micro wire for a transitional 4 [place] micro sheath. 2 mg tPA were administered through the micro sheath into the proximal aspect of the graft. The peripheral aspect of the graft was interrogated with ultrasound and found to be thrombosed. An image was obtained and stored for the medical record. Local anesthesia was attained by infiltration with 1% lidocaine . A small dermatotomy was made. Under real-time sonographic guidance, the vessel was punctured in retrograde fashion directed toward the arterial anastomosis with a 21 gauge micropuncture needle. Using standard technique, the initial micro needle was exchanged over a 0.018 micro wire for a transitional 4 [place] micro sheath. 2 mg tPA were administered through the micro sheath into the more peripheral aspect of the graft in the upper arm. Attention was turned proximal access. The 4 French micro sheath was exchanged over a Bentson wire for a 6 [place] sheath. An angled catheter was advanced over the Bentson wire into the left axillary vein. A central venogram was performed. The axillary, subclavian, and innominate veins are all widely patent as is the superior vena cava. No evidence of central stenosis. A pull-back left upper extremity venogram was performed. The cephalic arch is widely patent. Thrombus begins within the cephalic vein in the upper arm and extends through the graft. There is obvious stenosis at the graft to cephalic venous anastomosis. The wire and catheter were removed. A 6 French cleaner device was advanced through the sheath and into the  thrombosed segment of the graft and vein. Mechanical thrombectomy was then performed. The wire was then reintroduced into the central veins. A 7 x 100 mm mustang balloon was advanced through the distal graft and into the vein. Inflation was performed. The balloon was inflated to full effacement. There was significant narrowing at the venous anastomosis. The inflation was maintained for 60 seconds. The balloon was then deflated and removed. Attention was turned to the retrograde access. Over a 0.035 wire, the micro sheath was exchanged for a 7 [place] vascular sheath. An angled catheter and Glidewire were used to navigate into the brachial artery. A brachial arteriogram was performed confirming persistent occlusion of the arterial anastomosis. No evidence of distal embolization. A Bentson wire was advanced into the brachial artery. The angled catheter was removed. A Fogarty catheter was advanced over the wire. The Fogarty catheter was inflated and used to  pull the arterial plug back into the graft. This was performed several times. Attempts were made to aspirate the arterial plug which was unsuccessful. The 5 French catheter was reintroduced into the brachial artery. Contrast injection demonstrates patency of the arterial anastomosis but there is still no significant forward flow into the fistula. Therefore, a 4 x 40 mm mustang balloon was advanced across the arterial anastomosis in this was angioplastied to 4 mm. The balloon was then used to angioplasty the proximal aspect of the graft. Follow-up contrast injection demonstrates a persistent occlusive thrombus positioned in the mid aspect of the graft. Therefore, the 7 x 100 mm balloon was reintroduced and used to angioplasty the central aspect of the graft. Following this, repeat venography demonstrates wide patency of the graft with brisk flow. Repeat venography was also performed centrally demonstrating continued adequate flow and no evidence of residual stenosis.  IMPRESSION: 1. Completely thrombosed left upper extremity brachiocephalic arteriovenous graft/fistula. 2. Thrombosis appears to be due to a high-grade stenosis of approximately 60% at the graft to cephalic venous anastomosis. 3. Successful declot procedure including pharmacological and mechanical thrombectomy. 4. Successful angioplasty of venous anastomotic stenosis with less than 5% residual stenosis. ACCESS: This access remains amenable to future percutaneous interventions as clinically indicated. Electronically Signed   By: Fernando Hoyer M.D.   On: 04/12/2024 16:32   IR US  Guide Vasc Access Left Result Date: 04/12/2024 INDICATION: 83 year old female with end-stage renal disease on hemodialysis via a left upper extremity arteriovenous fistula which was partially repaired with a 4-7 mm straight graft replacing a portion of the draining cephalic vein. She comes in with thrombosis of the access. EXAM: IR ULTRASOUND GUIDANCE VASC ACCESS LEFT; IR THROMBECTOMY AV FISTULA W/THROMBOLYSIS/PTA INC/SHUNT/IMG LEFT MEDICATIONS: 5000 units heparin  given intravenously by the Radiology nurse. 4 mg tPA administered into the fistula by me. ANESTHESIA/SEDATION: A total of 1.5 mg Versed  and 50 mcg fentanyl  was administered intravenously by the Radiology nurse. Moderate Sedation Time:  54 minutes The patient's vital signs and level of consciousness were continuously monitored during the procedure by the interventional radiology nurse under my direct supervision. FLUOROSCOPY TIME:  Fluoroscopy Time:  minutes  seconds ( mGy). COMPLICATIONS: None immediate. PROCEDURE: Informed written consent was obtained from the patient after a thorough discussion of the procedural risks, benefits and alternatives. All questions were addressed. Maximal Sterile Barrier Technique was utilized including caps, mask, sterile gowns, sterile gloves, sterile drape, hand hygiene and skin antiseptic. A timeout was performed prior to the initiation of the  procedure. The proximal aspect of the graft was interrogated with ultrasound and found to be thrombosed. An image was obtained and stored for the medical record. Local anesthesia was attained by infiltration with 1% lidocaine . A small dermatotomy was made. Under real-time sonographic guidance, the vessel was punctured in antegrade fashion with a 21 gauge micropuncture needle. Using standard technique, the initial micro needle was exchanged over a 0.018 micro wire for a transitional 4 [place] micro sheath. 2 mg tPA were administered through the micro sheath into the proximal aspect of the graft. The peripheral aspect of the graft was interrogated with ultrasound and found to be thrombosed. An image was obtained and stored for the medical record. Local anesthesia was attained by infiltration with 1% lidocaine . A small dermatotomy was made. Under real-time sonographic guidance, the vessel was punctured in retrograde fashion directed toward the arterial anastomosis with a 21 gauge micropuncture needle. Using standard technique, the initial micro needle was exchanged over a 0.018 micro  wire for a transitional 4 [place] micro sheath. 2 mg tPA were administered through the micro sheath into the more peripheral aspect of the graft in the upper arm. Attention was turned proximal access. The 4 French micro sheath was exchanged over a Bentson wire for a 6 [place] sheath. An angled catheter was advanced over the Bentson wire into the left axillary vein. A central venogram was performed. The axillary, subclavian, and innominate veins are all widely patent as is the superior vena cava. No evidence of central stenosis. A pull-back left upper extremity venogram was performed. The cephalic arch is widely patent. Thrombus begins within the cephalic vein in the upper arm and extends through the graft. There is obvious stenosis at the graft to cephalic venous anastomosis. The wire and catheter were removed. A 6 French cleaner device was  advanced through the sheath and into the thrombosed segment of the graft and vein. Mechanical thrombectomy was then performed. The wire was then reintroduced into the central veins. A 7 x 100 mm mustang balloon was advanced through the distal graft and into the vein. Inflation was performed. The balloon was inflated to full effacement. There was significant narrowing at the venous anastomosis. The inflation was maintained for 60 seconds. The balloon was then deflated and removed. Attention was turned to the retrograde access. Over a 0.035 wire, the micro sheath was exchanged for a 7 [place] vascular sheath. An angled catheter and Glidewire were used to navigate into the brachial artery. A brachial arteriogram was performed confirming persistent occlusion of the arterial anastomosis. No evidence of distal embolization. A Bentson wire was advanced into the brachial artery. The angled catheter was removed. A Fogarty catheter was advanced over the wire. The Fogarty catheter was inflated and used to pull the arterial plug back into the graft. This was performed several times. Attempts were made to aspirate the arterial plug which was unsuccessful. The 5 French catheter was reintroduced into the brachial artery. Contrast injection demonstrates patency of the arterial anastomosis but there is still no significant forward flow into the fistula. Therefore, a 4 x 40 mm mustang balloon was advanced across the arterial anastomosis in this was angioplastied to 4 mm. The balloon was then used to angioplasty the proximal aspect of the graft. Follow-up contrast injection demonstrates a persistent occlusive thrombus positioned in the mid aspect of the graft. Therefore, the 7 x 100 mm balloon was reintroduced and used to angioplasty the central aspect of the graft. Following this, repeat venography demonstrates wide patency of the graft with brisk flow. Repeat venography was also performed centrally demonstrating continued adequate  flow and no evidence of residual stenosis. IMPRESSION: 1. Completely thrombosed left upper extremity brachiocephalic arteriovenous graft/fistula. 2. Thrombosis appears to be due to a high-grade stenosis of approximately 60% at the graft to cephalic venous anastomosis. 3. Successful declot procedure including pharmacological and mechanical thrombectomy. 4. Successful angioplasty of venous anastomotic stenosis with less than 5% residual stenosis. ACCESS: This access remains amenable to future percutaneous interventions as clinically indicated. Electronically Signed   By: Fernando Hoyer M.D.   On: 04/12/2024 16:32   Medications:  norepinephrine  (LEVOPHED ) Adult infusion Stopped (04/11/24 0609)    amoxicillin-clavulanate  1 tablet Oral Q24H   atorvastatin   10 mg Oral Daily   calcitRIOL  0.25 mcg Oral Q T,Th,Sa-HD   cosyntropin  0.25 mg Intravenous Once   darbepoetin (ARANESP ) injection - DIALYSIS  200 mcg Subcutaneous Q Tue-1800   doxycycline  100 mg Oral Q12H  feeding supplement (GLUCERNA SHAKE)  237 mL Oral TID BM   ferric citrate   420 mg Oral TID WC   insulin  aspart  0-6 Units Subcutaneous TID WC   melatonin  5 mg Oral QHS   mupirocin ointment  1 Application Nasal BID   naphazoline-pheniramine  1 drop Right Eye BID   pantoprazole  (PROTONIX ) IV  40 mg Intravenous Q24H     Physical Exam General: Chronically ill appearing female in NAD Head: Normocephalic, bruised face, R eye with slight conjunctival hemorrhage  Neck: Supple. JVD not elevated. Lungs: Decreased in bases CTAB Heart: S1,S2 No M/R/G Abdomen: NABS, NT Lower extremities:Trace BLE, Wound RLE Foam drsg R knee/R ankle. Erythema noted R foot and R ankle-new.  Neuro: Alert and oriented X 3. Moves all extremities spontaneously. Psych:  Responds to questions appropriately with a normal affect. Dialysis Access: L AVG + T/B very bruised.   Dialysis Orders: Center: [facility] T,Th,S  3.5 hrs 180NRe 400/Autoflow 1.5 70 kg 2.0 K/2.5 Ca -  No heparin  - Calcitriol 0.25 mcg PO three times per week - Mircera 225 mcg IV Q 2 weeks (Last dose 03/28/2024 due 04/11/2024)   Assessment/Plan:  Shock: 2/2 PNA Management per PCCM. Off pressors. Resolved.   R Pleural effusion: PCCM doing thoracentesis at bedside.Yield 1.4 liters Per PCCM. Serial HD 06/18, 06/19 NET UF 4.4 liters over 2 days. Now on RA.   ESRD -  T,Th,S. AVG clotted 04/11/2024. Fortunately IR able to salvage AVG.  04/15/2024 at OP Center.   Hypotension/volume  - BP stable off pressors. Serial HD 06/18, 06/19 NET UF 4.4 liters over 2 days. Still with trace edema but on RA. Lower EDW on discharge.   Anemia  - HGB 9.8 Low Tsat.ESA ordered 06/17 but held! Will give today prior to DC.   Metabolic bone disease -  Continue Auryxia  binder, VDRA   Nutrition - Albumin  2.6. FFT, add protein supplements Corrected Ca+/PO4 at goal.   DMT2- per primary  9.    FFT-OT, PT needed.   Milea Klink H. Calob Baskette NP-C 04/14/2024, 10:07 AM  [employer] [phone]

## 2024-04-14 NOTE — Progress Notes (Signed)
 SATURATION QUALIFICATIONS:   Patient Saturations on Room Air at Rest = 96%  Patient Saturations on Room Air while Ambulating = 76%  Patient Saturations on 0 Liters of oxygen while Ambulating = 76%  After seated rest on room air per pt request, O2 sat increased to 94%.   Please briefly explain why patient needs home oxygen:  Desaturation on room air with ambulation.

## 2024-04-14 NOTE — Progress Notes (Addendum)
 Mobility Specialist Progress Note;   04/14/24 0927  Mobility  Activity Ambulated with assistance in hallway  Level of Assistance Contact guard assist, steadying assist  Assistive Device Front wheel walker  Distance Ambulated (ft) 80 ft  Activity Response Tolerated well  Mobility Referral Yes  Mobility visit 1 Mobility  Mobility Specialist Start Time (ACUTE ONLY) [id number]  Mobility Specialist Stop Time (ACUTE ONLY) 0941  Mobility Specialist Time Calculation (min) (ACUTE ONLY) 13 min   Pt agreeable to mobility. On RA upon arrival. Required MinG assistance during ambulation, chair follow for safety. Able to ambulate on RA then SPO2 desat to 81%. Took 1x seated rest break and performed PLB, SPO2 quickly recover to 95%. Pt placed on 2LO2 to ambulate for comfort. No c/o when asked. Pt able to ambulate back to room on RA w/o fault. Pt left in chair with all needs met, alarm on. RN notified.   Janit Meline Mobility Specialist Please contact via SecureChat or [employer] [phone]

## 2024-04-14 NOTE — Plan of Care (Signed)
  Problem: Activity: Goal: Risk for activity intolerance will decrease Outcome: Progressing   Problem: Pain Managment: Goal: General experience of comfort will improve and/or be controlled Outcome: Progressing   Problem: Safety: Goal: Ability to remain free from injury will improve Outcome: Progressing   Problem: Skin Integrity: Goal: Risk for impaired skin integrity will decrease Outcome: Progressing

## 2024-04-14 NOTE — Discharge Summary (Signed)
 Name: Anna Carr MRN: [id number] DOB: May 28, 1941 83 y.o. PCP: Abbe Hoard., MD  Date of Admission: 04/10/2024 10:21 AM Date of Discharge: 04/14/2024 Attending Physician: Dr. Alwin Baars   Discharge Diagnosis: Principal Problem:   Shock, unspecified ([name]) Active Problems:   Anemia in chronic kidney disease   CHF with left ventricular diastolic dysfunction, NYHA class 1 ([name])   Hyponatremia   ESRD on hemodialysis ([name])   Moderate protein-calorie malnutrition ([name])   Hypertension   AVM (arteriovenous malformation)   Community acquired pneumonia   Type 2 diabetes mellitus, with long-term current use of insulin  ([name])   Pressure injury of skin    Discharge Medications: Allergies as of 04/14/2024       Reactions   Atorvastatin  Other (See Comments)   Constipation        Medication List     PAUSE taking these medications    aspirin  EC 81 MG tablet Wait to take this until your doctor or other care provider tells you to start again. Take 1 tablet (81 mg total) by mouth daily. Swallow whole.   carvedilol  6.25 MG tablet Wait to take this until your doctor or other care provider tells you to start again. Commonly known as: COREG  Take 6.25 mg by mouth daily.   furosemide  80 MG tablet Wait to take this until your doctor or other care provider tells you to start again. Commonly known as: LASIX  Take 80 mg by mouth daily in the afternoon.   lidocaine  5 % Wait to take this until your doctor or other care provider tells you to start again. Commonly known as: LIDODERM  Place 1 patch onto the skin daily. Remove & Discard patch within 12 hours or as directed by MD       STOP taking these medications    atorvastatin  10 MG tablet Commonly known as: LIPITOR   benzonatate  200 MG capsule Commonly known as: TESSALON    cephALEXin 500 MG capsule Commonly known as: KEFLEX       TAKE these medications    acetaminophen  500 MG tablet Commonly known as: TYLENOL  Take 500  mg by mouth every 6 (six) hours as needed for mild pain (pain score 1-3) or moderate pain (pain score 4-6).   ascorbic acid 1000 MG tablet Commonly known as: VITAMIN C Take 1,000 mg by mouth at bedtime.   Biotin 5000 MCG Tabs Take 5,000 mcg by mouth daily in the afternoon.   carboxymethylcellulose 0.5 % Soln Commonly known as: REFRESH PLUS Place 1 drop into both eyes 3 (three) times daily as needed (dry eyes).   cyanocobalamin 1000 MCG tablet Commonly known as: VITAMIN B12 Take 1,000 mcg by mouth every evening.   doxycycline 100 MG tablet Commonly known as: VIBRA-TABS Take 1 tablet (100 mg total) by mouth every 12 (twelve) hours.   ferric citrate  1 GM 210 MG(Fe) tablet Commonly known as: AURYXIA  Take 210 mg by mouth 3 (three) times daily with meals.   FISH OIL PO Take 1 capsule by mouth in the morning and at bedtime.   fluticasone  50 MCG/ACT nasal spray Commonly known as: FLONASE  Place 1 spray into both nostrils daily as needed (Congestion).   ibuprofen 200 MG tablet Commonly known as: ADVIL Take 200-400 mg by mouth every 6 (six) hours as needed for mild pain (pain score 1-3).   lidocaine -prilocaine  cream Commonly known as: EMLA  Apply 1 application topically as needed (prior to fisula access). Tuesday, Thursday, Saturday for dialysis   meloxicam 7.5 MG tablet Commonly  known as: MOBIC Take 7.5-15 mg by mouth daily as needed for pain.   Muscle Rub 10-15 % Crea Apply 1 Application topically 2 (two) times daily. Apply to left shoulder. What changed:  when to take this reasons to take this   pantoprazole  40 MG tablet Commonly known as: PROTONIX  Take 1 tablet (40 mg total) by mouth 2 (two) times daily.   polyethylene glycol 17 g packet Commonly known as: MIRALAX  / GLYCOLAX  Take 17 g by mouth 2 (two) times daily as needed for moderate constipation.   PROBIOTIC ACIDOPHILUS PO Take 1 capsule by mouth in the morning.   senna 8.6 MG Tabs tablet Commonly known as:  SENOKOT Take 1 tablet (8.6 mg total) by mouth daily as needed for mild constipation.   Soliqua  100-33 UNT-MCG/ML Sopn Generic drug: Insulin  Glargine-Lixisenatide  Inject 6 Units into the skin in the morning.   Vitamin E 670 MG (1000 UT) Caps Take 1,000 Units by mouth in the morning.               Durable Medical Equipment  (From admission, onward)           Start     Ordered   04/14/24 1249  For home use only DME oxygen  Once       Question Answer Comment  Length of Need 6 Months   Mode or (Route) Nasal cannula   Liters per Minute 2   Frequency Continuous (stationary and portable oxygen unit needed)   Oxygen conserving device Yes   Oxygen delivery system Gas      04/14/24 1249            Disposition and follow-up:   [name] was discharged from [facility] in Good condition.  At the hospital follow up visit please address:  1.  Follow-up:  a. a. The patient was discharged with home oxygen due to intermittent hypoxia noted with exertion and during nighttime. This was suspected to be related to either anxiety or recovery from a recent pneumonia. Please repeat an ambulatory oxygen assessment to determine whether continued home oxygen therapy is necessary.  b. Kindly repeat a CBC to confirm hemoglobin stability.  c. Please reinforce the importance of adherence to her dialysis schedule.   2.  Labs / imaging needed at time of follow-up: CBC     Medication Changes  Abx -  Doxycyline  End Date: 04/14/2024   Follow-up Appointments:  Follow-up Information     [facility] Follow up.   Specialty: Home Health Services Why: Registered Nurse, Physical and Occupational Therapy, Social Work-office will call you to schedule resumption of care visit. Contact information: [address] [place] [address] [phone]         Abbe Hoard., MD Follow up.   Specialty: Internal Medicine Contact information: [address] [place] [address] [phone]                 Hospital Course by problem list: CAP AV Fistula Clot The patient was admitted with weakness and encephalopathy, suspected to be secondary to a gastrointestinal (GI) bleed. She had a prior hospitalization in May for acute on chronic anemia, during which both an EGD and capsule endoscopy were performed without identifying a clear source of bleeding. A third endoscopy on May 8 revealed non-bleeding arteriovenous malformations (AVMs) in the duodenum, which were treated with argon plasma coagulation. The procedure was complicated by a bradycardic arrest; however, return of spontaneous  circulation (ROSC) was achieved promptly after brief CPR.  During the current admission, a chest X-ray revealed pneumonia, and she was started on antibiotics. She subsequently developed hypotension, necessitating a brief transfer to the ICU for vasopressor support. After stabilization, she was transferred back to the medical floor, where antibiotics were continued. At discharge, her antibiotic regimen was de-escalated to Augmentin and Doxycycline.Lasix  was paused on admission due to hypotension. Please review her vitals and volume status and resume when appropriate.    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 Post-procedure, the fistula was reassessed and used for dialysis. Nephrology followed her closely throughout her stay, and she received dialysis as clinically indicated.  Additionally, there were concerns for intermittent nocturnal hypoxia, for which she required 2-3 liters of supplemental oxygen at night. She desat to 70- 80s during on ambulatory O2.She was sent home on portable oxygen.   Her ambulatory O2 stats were: SATURATION QUALIFICATIONS:    Patient Saturations on Room Air at Rest = 96%   Patient Saturations on Room Air while Ambulating = 76%   Patient  Saturations on 0 Liters of oxygen while Ambulating = 76%   After seated rest on room air per pt request, O2 sat increased to 94%.      Discharge Subjective:  The patient was seen at the bedside today. She was seated in a chair and appeared comfortable, without acute distress. She expressed curiosity about the cause of her nighttime shortness of breath and requested home oxygen out of concern for needing it after discharge. She otherwise denied dizziness, bloody stools, or chest pain.  Discharge Exam:   BP (!) 123/49 (BP Location: Right Arm)   Pulse 86   Temp 97.6 F (36.4 C) (Axillary)   Resp 18   Ht 5' 1 (1.549 m)   Wt 73.1 kg   SpO2 98%   BMI 30.44 kg/m  Constitutional: well-appearing woman,  sitting in bed , in no acute distress Cardiovascular: regular rate and rhythm, no m/r/g Pulmonary/Chest: normal work of breathing on room air MSK: normal bulk and tone Neurological: alert & oriented x 3, 5/5 strength in bilateral upper and lower extremities, normal gait Skin: warm and dry   Pertinent Labs, Studies, and Procedures:     Latest Ref Rng & Units 04/14/2024    3:54 AM 04/13/2024   12:58 PM 04/13/2024    9:41 AM  CBC  WBC 4.0 - 10.5 K/uL 5.4  6.2    Hemoglobin 12.0 - 15.0 g/dL 9.8  9.8  9.7   Hematocrit 36.0 - 46.0 % 31.1  30.9  30.3   Platelets 150 - 400 K/uL 139  163         Latest Ref Rng & Units 04/14/2024   10:58 AM 04/13/2024   12:58 PM 04/12/2024    9:37 PM  CMP  Glucose 70 - 99 mg/dL 161  096  045   BUN 8 - 23 mg/dL 36  44  85   Creatinine 0.44 - 1.00 mg/dL 4.09  8.11  9.14   Sodium 135 - 145 mmol/L 131  130  125   Potassium 3.5 - 5.1 mmol/L 4.0  4.3  5.2   Chloride 98 - 111 mmol/L 90  90  87   CO2 22 - 32 mmol/L 24  23  19    Calcium  8.9 - 10.3 mg/dL 8.8  8.6  9.2     DG CHEST PORT 1  VIEW Result Date: 04/11/2024 CLINICAL DATA:  Status post thoracentesis. EXAM: PORTABLE CHEST 1 VIEW COMPARISON:  04/10/2024. FINDINGS: Prominent cardiac silhouette. Small  bilateral pleural effusions. No pneumothorax. Normal pulmonary vasculature. Calcified aorta. Thoracic degenerative disc disease. IMPRESSION: Small bilateral pleural effusions. No pneumothorax. Electronically Signed   By: Sydell Eva M.D.   On: 04/11/2024 13:33   ECHOCARDIOGRAM LIMITED Result Date: 04/10/2024    ECHOCARDIOGRAM LIMITED REPORT   Patient Name:   CARRISSA TAITANO Date of Exam: 04/10/2024 Medical Rec #:  [id number]         Height:       61.0 in Accession #:    [id number]        Weight:       162.3 lb Date of Birth:  10-Aug-1941         BSA:          1.728 m Patient Age:    83 years          BP:           82/53 mmHg Patient Gender: F                 HR:           68 bpm. Exam Location:  Inpatient Procedure: 2D Echo, Cardiac Doppler and Color Doppler (Both Spectral and Color            Flow Doppler were utilized during procedure). Indications:    Septic Shock  History:        Patient has no prior history of Echocardiogram examinations and                 Patient has prior history of Echocardiogram examinations, most                 recent 03/02/2024. CHF, COPD; Risk Factors:Hypertension and                 Diabetes.  Sonographer:    Sulema Endo RDCS, FE, PE Referring Phys: [id number] PRAVEEN MANNAM IMPRESSIONS  1. Left ventricular ejection fraction, by estimation, is 50 to 55%. The left ventricle has low normal function. There is mild concentric left ventricular hypertrophy. Left ventricular diastolic parameters are consistent with Grade I diastolic dysfunction (impaired relaxation). Elevated left atrial pressure.  2. Left atrial size was severely dilated.  3. Right atrial size was mildly dilated.  4. Mitral valve area by planimmetry 1.46 cm ^2. The mitral valve is degenerative. Moderate to severe mitral valve regurgitation. Mild mitral stenosis.  5. Tricuspid valve regurgitation is moderate.  6. There is moderate calcification of the aortic valve. There is mild thickening of the aortic valve. Aortic  valve sclerosis/calcification is present, without any evidence of aortic stenosis. Aortic valve area, by VTI measures 0.96 cm. Aortic valve mean gradient measures 4.0 mmHg. Aortic valve Vmax measures 1.34 m/s.  7. There is normal pulmonary artery systolic pressure.  8. The inferior vena cava is dilated in size with >50% respiratory variability, suggesting right atrial pressure of 8 mmHg. FINDINGS  Left Ventricle: Left ventricular ejection fraction, by estimation, is 50 to 55%. The left ventricle has low normal function. There is mild concentric left ventricular hypertrophy. Left ventricular diastolic parameters are consistent with Grade I diastolic dysfunction (impaired relaxation). Elevated left atrial pressure. Right Ventricle: There is normal pulmonary artery systolic pressure. The tricuspid regurgitant velocity is 2.28 m/s, and with an assumed right atrial pressure of 8 mmHg, the estimated right ventricular systolic pressure  is 28.8 mmHg. Left Atrium: Left atrial size was severely dilated. Right Atrium: Right atrial size was mildly dilated. Mitral Valve: Mitral valve area by planimmetry 1.46 cm ^2. The mitral valve is degenerative in appearance. Moderate to severe mitral valve regurgitation. Mild mitral valve stenosis. MV peak gradient, 9.6 mmHg. The mean mitral valve gradient is 3.0 mmHg. Tricuspid Valve: Tricuspid valve regurgitation is moderate. Aortic Valve: There is moderate calcification of the aortic valve. There is mild thickening of the aortic valve. Aortic valve sclerosis/calcification is present, without any evidence of aortic stenosis. Aortic valve mean gradient measures 4.0 mmHg. Aortic valve peak gradient measures 7.1 mmHg. Aortic valve area, by VTI measures 0.96 cm. Venous: The inferior vena cava is dilated in size with greater than 50% respiratory variability, suggesting right atrial pressure of 8 mmHg. LEFT VENTRICLE PLAX 2D LVIDd:         4.40 cm   Diastology LVIDs:         3.30 cm   LV e'  medial:    5.98 cm/s LV PW:         1.20 cm   LV E/e' medial:  24.2 LV IVS:        1.10 cm   LV e' lateral:   9.03 cm/s LVOT diam:     1.80 cm   LV E/e' lateral: 16.1 LV SV:         31 LV SV Index:   18 LVOT Area:     2.54 cm  RIGHT VENTRICLE RV S prime:     6.31 cm/s TAPSE (M-mode): 0.8 cm LEFT ATRIUM             Index        RIGHT ATRIUM           Index LA Vol (A2C):   96.8 ml 56.01 ml/m  RA Area:     19.30 cm LA Vol (A4C):   83.6 ml 48.38 ml/m  RA Volume:   55.40 ml  32.06 ml/m LA Biplane Vol: 90.4 ml 52.31 ml/m  AORTIC VALVE AV Area (Vmax):    1.03 cm AV Area (Vmean):   1.06 cm AV Area (VTI):     0.96 cm AV Vmax:           133.50 cm/s AV Vmean:          91.250 cm/s AV VTI:            0.320 m AV Peak Grad:      7.1 mmHg AV Mean Grad:      4.0 mmHg LVOT Vmax:         54.00 cm/s LVOT Vmean:        37.900 cm/s LVOT VTI:          0.120 m LVOT/AV VTI ratio: 0.38  AORTA Ao Root diam: 2.70 cm MITRAL VALVE                TRICUSPID VALVE MV Area (PHT): 3.37 cm     TR Peak grad:   20.8 mmHg MV Area VTI:   0.77 cm     TR Vmax:        228.00 cm/s MV Peak grad:  9.6 mmHg MV Mean grad:  3.0 mmHg     SHUNTS MV Vmax:       1.55 m/s     Systemic VTI:  0.12 m MV Vmean:      81.2 cm/s    Systemic Diam: 1.80 cm MV Decel Time:  225 msec MV E velocity: 145.00 cm/s MV A velocity: 94.60 cm/s MV E/A ratio:  1.53 Maudine Sos MD Electronically signed by Maudine Sos MD Signature Date/Time: 04/10/2024/11:07:19 PM    Final    CT CHEST ABDOMEN PELVIS WO CONTRAST Result Date: 04/10/2024 CLINICAL DATA:  Recent fall, hypotension. EXAM: CT CHEST, ABDOMEN AND PELVIS WITHOUT CONTRAST TECHNIQUE: Multidetector CT imaging of the chest, abdomen and pelvis was performed following the standard protocol without IV contrast. RADIATION DOSE REDUCTION: This exam was performed according to the departmental dose-optimization program which includes automated exposure control, adjustment of the mA and/or kV according to patient size and/or  use of iterative reconstruction technique. COMPARISON:  None Available. FINDINGS: CT CHEST FINDINGS Cardiovascular: No significant vascular findings. Normal heart size. No pericardial effusion. Mediastinum/Nodes: No axillary or supraclavicular adenopathy. No mediastinal or hilar adenopathy. No pericardial fluid. Esophagus normal. Lungs/Pleura: Lobar airspace disease in the LEFT lower lobe small effusion. Large RIGHT pleural effusion. Musculoskeletal: Fracture of the RIGHT second and fourth ribs anterior and laterally appear chronic. Fracture of the anterolateral fifth rib (image 72/5 appears more acute. Fracture of the anterior RIGHT seventh rib (image 112/series 5 also appears acute CT ABDOMEN AND PELVIS FINDINGS Hepatobiliary: No focal hepatic lesion. Large gallstone. No gallbladder inflammation. Pancreas: Pancreas is normal. No ductal dilatation. No pancreatic inflammation. Spleen: Normal spleen Adrenals/urinary tract: Adrenal glands normal. LEFT kidney small compared to the RIGHT. Ureters and bladder normal. Stomach/Bowel: Stomach, small bowel, appendix, and cecum are normal. Multiple diverticula of the descending colon and sigmoid colon without acute inflammation. Vascular/Lymphatic: Abdominal aorta is normal caliber with atherosclerotic calcification. There is no retroperitoneal or periportal lymphadenopathy. No pelvic lymphadenopathy. Reproductive: Uterus and adnexa unremarkable. Other: Moderate amount free fluid the pelvis. Some fluid along the RIGHT margin liver. Musculoskeletal: No pelvic fracture.  No acute spine fracture. IMPRESSION: CHEST: 1. Lobar airspace disease in the LEFT lower lobe with small effusion. Findings consistent with LEFT lower lobe pneumonia 2. Acute or subacute fractures of the RIGHT fifth and seventh ribs. 3. More remote fractures of the RIGHT second and fourth ribs. 4. Large asymmetric RIGHT pleural effusion.  Etiology unclear. PELVIS: 1. Moderate amount of free fluid in the pelvis  and along the RIGHT margin of the liver. 2. No pelvic fracture. No acute spine fracture. 3. Cholelithiasis without evidence of cholecystitis. 4.  Aortic Atherosclerosis (ICD10-I70.0). Electronically Signed   By: Deboraha Fallow M.D.   On: 04/10/2024 18:59   DG Chest Portable 1 View Result Date: 04/10/2024 CLINICAL DATA:  Hypoxia EXAM: PORTABLE CHEST 1 VIEW COMPARISON:  Mar 09, 2024 FINDINGS: Persistent bilateral pulmonary infiltrates that correlate with congestive changes with persistent bilateral pleural effusions right larger than left left. Basilar atelectasis. Calcification of the annulus of the mitral valve. Heart upper limits of normal. IMPRESSION: Mild residual congestive changes and pleural effusions Electronically Signed   By: Fredrich Jefferson M.D.   On: 04/10/2024 11:59     Discharge Instructions: Discharge Instructions     Diet - low sodium heart healthy   Complete by: As directed    Increase activity slowly   Complete by: As directed    No wound care   Complete by: As directed        Signed: Fay Hoop, MD Internal Medicine Teaching Service Pager [phone]

## 2024-04-14 NOTE — Progress Notes (Addendum)
 Nurse requested Mobility Specialist to perform oxygen saturation test with pt which includes removing pt from oxygen both at rest and while ambulating.  Below are the results from that testing.     Patient Saturations on Room Air at Rest = spO2 94%  Patient Saturations on Room Air while Ambulating = sp02 81% .    Patient Saturations on 2 Liters of oxygen while Ambulating = sp02 95%  At end of testing pt left in room on 0  Liters of oxygen.  Reported results to nurse.    Janit Meline Mobility Specialist Please contact via SecureChat or [employer] [phone]

## 2024-04-15 ENCOUNTER — Telehealth (HOSPITAL_COMMUNITY): Payer: Self-pay | Admitting: Nephrology

## 2024-04-15 LAB — CULTURE, BLOOD (ROUTINE X 2)
Culture: NO GROWTH
Culture: NO GROWTH

## 2024-04-15 NOTE — Telephone Encounter (Signed)
 Transition of Care - Initial Contact after Hospitalization  Date of discharge:  04/14/24 Date of contact: 04/15/24  Method: Phone Spoke to: Patient's daughter  Patient contacted to discuss transition of care from recent inpatient hospitalization. Patient was admitted to [facility] from 6/16 - 04/14/24 with sepsis/shock, ?pneumonia.  The discharge medication list was reviewed. Daughter has picked up all meds  Patient is at dialysis now. She hasn't heard yet how things went - EDW was lowered.  Daughter is wondering if her leg rash in contagious - explained likely not, but make sure to continue abx and we can follow it as an outpatient.  Izetta Boehringer, PA-C [employer] Pager [phone]

## 2024-04-17 ENCOUNTER — Other Ambulatory Visit: Payer: Self-pay

## 2024-04-17 ENCOUNTER — Encounter (HOSPITAL_COMMUNITY): Payer: Self-pay | Admitting: *Deleted

## 2024-04-17 ENCOUNTER — Emergency Department (HOSPITAL_COMMUNITY)

## 2024-04-17 ENCOUNTER — Observation Stay (HOSPITAL_COMMUNITY)
Admission: EM | Admit: 2024-04-17 | Discharge: 2024-04-19 | Disposition: A | Discharge status: Home Health | Attending: Infectious Diseases | Admitting: Infectious Diseases

## 2024-04-17 ENCOUNTER — Observation Stay (HOSPITAL_COMMUNITY)

## 2024-04-17 DIAGNOSIS — E871 Hypo-osmolality and hyponatremia: Secondary | ICD-10-CM | POA: Diagnosis present

## 2024-04-17 DIAGNOSIS — Z7982 Long term (current) use of aspirin: Secondary | ICD-10-CM | POA: Diagnosis not present

## 2024-04-17 DIAGNOSIS — Z8616 Personal history of COVID-19: Secondary | ICD-10-CM | POA: Diagnosis not present

## 2024-04-17 DIAGNOSIS — J449 Chronic obstructive pulmonary disease, unspecified: Secondary | ICD-10-CM | POA: Diagnosis not present

## 2024-04-17 DIAGNOSIS — Z4800 Encounter for change or removal of nonsurgical wound dressing: Secondary | ICD-10-CM | POA: Diagnosis not present

## 2024-04-17 DIAGNOSIS — N186 End stage renal disease: Principal | ICD-10-CM

## 2024-04-17 DIAGNOSIS — Z794 Long term (current) use of insulin: Secondary | ICD-10-CM | POA: Diagnosis not present

## 2024-04-17 DIAGNOSIS — D631 Anemia in chronic kidney disease: Secondary | ICD-10-CM | POA: Insufficient documentation

## 2024-04-17 DIAGNOSIS — Z992 Dependence on renal dialysis: Secondary | ICD-10-CM | POA: Insufficient documentation

## 2024-04-17 DIAGNOSIS — K921 Melena: Principal | ICD-10-CM

## 2024-04-17 DIAGNOSIS — K922 Gastrointestinal hemorrhage, unspecified: Secondary | ICD-10-CM | POA: Insufficient documentation

## 2024-04-17 DIAGNOSIS — I5031 Acute diastolic (congestive) heart failure: Secondary | ICD-10-CM | POA: Insufficient documentation

## 2024-04-17 DIAGNOSIS — I10 Essential (primary) hypertension: Secondary | ICD-10-CM | POA: Diagnosis present

## 2024-04-17 DIAGNOSIS — K625 Hemorrhage of anus and rectum: Secondary | ICD-10-CM | POA: Insufficient documentation

## 2024-04-17 DIAGNOSIS — D649 Anemia, unspecified: Secondary | ICD-10-CM | POA: Diagnosis present

## 2024-04-17 DIAGNOSIS — E1122 Type 2 diabetes mellitus with diabetic chronic kidney disease: Secondary | ICD-10-CM | POA: Diagnosis not present

## 2024-04-17 DIAGNOSIS — I5030 Unspecified diastolic (congestive) heart failure: Secondary | ICD-10-CM | POA: Diagnosis present

## 2024-04-17 DIAGNOSIS — Z79899 Other long term (current) drug therapy: Secondary | ICD-10-CM | POA: Diagnosis not present

## 2024-04-17 DIAGNOSIS — Z87891 Personal history of nicotine dependence: Secondary | ICD-10-CM | POA: Insufficient documentation

## 2024-04-17 DIAGNOSIS — I132 Hypertensive heart and chronic kidney disease with heart failure and with stage 5 chronic kidney disease, or end stage renal disease: Secondary | ICD-10-CM | POA: Diagnosis not present

## 2024-04-17 DIAGNOSIS — Z8679 Personal history of other diseases of the circulatory system: Secondary | ICD-10-CM | POA: Diagnosis not present

## 2024-04-17 DIAGNOSIS — R531 Weakness: Secondary | ICD-10-CM | POA: Diagnosis present

## 2024-04-17 LAB — COMPREHENSIVE METABOLIC PANEL WITH GFR
ALT: 21 U/L (ref 0–44)
AST: 28 U/L (ref 15–41)
Albumin: 2.4 g/dL — ABNORMAL LOW (ref 3.5–5.0)
Alkaline Phosphatase: 117 U/L (ref 38–126)
Anion gap: 16 — ABNORMAL HIGH (ref 5–15)
BUN: 57 mg/dL — ABNORMAL HIGH (ref 8–23)
CO2: 23 mmol/L (ref 22–32)
Calcium: 9 mg/dL (ref 8.9–10.3)
Chloride: 89 mmol/L — ABNORMAL LOW (ref 98–111)
Creatinine, Ser: 5.09 mg/dL — ABNORMAL HIGH (ref 0.44–1.00)
GFR, Estimated: 8 mL/min — ABNORMAL LOW (ref >60)
Glucose, Bld: 185 mg/dL — ABNORMAL HIGH (ref 70–99)
Potassium: 4.9 mmol/L (ref 3.5–5.1)
Sodium: 128 mmol/L — ABNORMAL LOW (ref 135–145)
Total Bilirubin: 1 mg/dL (ref 0.0–1.2)
Total Protein: 5.5 g/dL — ABNORMAL LOW (ref 6.5–8.1)

## 2024-04-17 LAB — CBC
HCT: 22.2 % — ABNORMAL LOW (ref 36.0–46.0)
Hemoglobin: 6.9 g/dL — CL (ref 12.0–15.0)
MCH: 31.8 pg (ref 26.0–34.0)
MCHC: 31.1 g/dL (ref 30.0–36.0)
MCV: 102.3 fL — ABNORMAL HIGH (ref 80.0–100.0)
Platelets: 234 10*3/uL (ref 150–400)
RBC: 2.17 MIL/uL — ABNORMAL LOW (ref 3.87–5.11)
RDW: 18.9 % — ABNORMAL HIGH (ref 11.5–15.5)
WBC: 7.4 10*3/uL (ref 4.0–10.5)
nRBC: 1.2 % — ABNORMAL HIGH (ref 0.0–0.2)

## 2024-04-17 LAB — CBG MONITORING, ED: Glucose-Capillary: 189 mg/dL — ABNORMAL HIGH (ref 70–99)

## 2024-04-17 LAB — PREPARE RBC (CROSSMATCH)

## 2024-04-17 MED ORDER — PANTOPRAZOLE SODIUM 40 MG IV SOLR
40.0000 mg | Freq: Two times a day (BID) | INTRAVENOUS | Status: DC
  Administered 2024-04-17: 40 mg via INTRAVENOUS
  Filled 2024-04-17: qty 10

## 2024-04-17 MED ORDER — CHLORHEXIDINE GLUCONATE CLOTH 2 % EX PADS: 6.0000 | MEDICATED_PAD | Freq: Every day | CUTANEOUS | Status: DC

## 2024-04-17 MED ORDER — PANTOPRAZOLE SODIUM 40 MG IV SOLR
40.0000 mg | Freq: Two times a day (BID) | INTRAVENOUS | Status: DC
  Administered 2024-04-18 – 2024-04-19 (×3): 40 mg via INTRAVENOUS
  Filled 2024-04-17 (×3): qty 10

## 2024-04-17 MED ORDER — IOHEXOL 350 MG/ML SOLN
75.0000 mL | Freq: Once | INTRAVENOUS | Status: AC | PRN
  Administered 2024-04-17: 75 mL via INTRAVENOUS

## 2024-04-17 MED ORDER — SODIUM CHLORIDE 0.9% IV SOLUTION: Freq: Once | INTRAVENOUS | Status: DC

## 2024-04-17 MED ORDER — SENNOSIDES-DOCUSATE SODIUM 8.6-50 MG PO TABS
1.0000 | ORAL_TABLET | Freq: Every evening | ORAL | Status: DC | PRN
  Administered 2024-04-19: 1 via ORAL
  Filled 2024-04-17: qty 1

## 2024-04-17 NOTE — ED Notes (Signed)
 PRBC infusing at 120 ml / hr x 15 mins , denies pain , respirations unlabored , afebrile /VSS.

## 2024-04-17 NOTE — Consult Note (Signed)
 [facility] Renal Consultation Note  Requesting MD: Bernardino Fireman, MD Indication for Consultation:  ESRD  Chief complaint: weak and my daughter saw that I was bleeding  HPI:  Anna Carr is a 83 y.o. female with a history of end-stage renal disease on hemodialysis, chronic heart failure with preserved ejection fraction, hypertension, and prior GI bleed who presented to the hospital with report of rectal bleeding and weakness.  She states that she was also lightheaded and had nausea.   ER MD is transfusing PRBCs.  He is agreeable to doing 1 unit of blood now letting that team reassess tomorrow.  Breathing is ok right now but she has had trouble in the recent past with her breathing.  She states that her daughter noticed that she had blood per rectum.  Spoke with her daughter at bedside.  States patient had gotten weaker and had red blood per rectum.  Note that she was just discharged on 6/20 after an admission for shock, clotted AV graft, and community-acquired pneumonia.      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 difficile colitis 03/30/2021   Cardiac arrest ([name]) 03/02/2024   Cellulitis 03/16/2021   CHF with left ventricular diastolic dysfunction, NYHA class 1 ([name]) 02/02/2017   Cholelithiasis  03/30/2021   Chronic constipation 12/22/2015   Chronic laryngitis 02/17/2017   Last Assessment & Plan:   Formatting of this note might be different from the original.  Evaluation of larynx.  Was hospitalized for pneumonia and during that time she started noticing worsening hoarseness.  Denies any throat pain.  No obvious heartburn.  She did recently start PPI therapy.  She smoked in the past.  Denies any difficulty swallowing.  Hoarseness is improving.  EXAM shows mildly ras   Coagulation defect, unspecified ([name]) 12/07/2019   Colon polyp    Complication of vascular dialysis catheter 03/05/2020   Contact with and (suspected) exposure to covid-19 10/28/2020   COPD mixed type ([name])    Cough, unspecified 10/28/2020   COVID-19 10/29/2020   Depression    Diverticulosis of large intestine without perforation or abscess without bleeding 01/08/2020   Dyspnea on exertion    Dyspnea, unspecified 11/02/2019   Elevated troponin 03/16/2021   Encounter for removal of sutures 12/28/2019   Encounter for screening for respiratory tuberculosis 12/06/2019   ESRD (end stage renal disease) ([name]) 07/18/2019   ESRD on dialysis ([name]) 09/18/2019   ESRD on hemodialysis ([name]) 01/02/2020   Fever, unspecified 01/23/2020   GI bleed 12/19/2021   GIB (gastrointestinal bleeding) 01/02/2020   Heme positive stool    High risk medication use 12/22/2015   History of blood transfusion    History of colonic polyps 01/08/2020   HLD (hyperlipidemia) 12/22/2015   Hypertension    Hypervolemia 11/13/2019   Hypokalemia 03/11/2019   Hyponatremia 03/11/2019  Hypovolemia 10/27/2021   Hypoxia 03/11/2019   Insomnia    Insulin  long-term use ([name]) 12/22/2015   Iron  deficiency anemia, unspecified 12/06/2019   Major depressive disorder, recurrent ([name]) 12/22/2015   Malaise and fatigue 12/23/2015   Mild protein-calorie malnutrition ([name]) 03/26/2020   Mitral regurgitation moderate to severe based on echocardiogram from 2022  11/03/2021   Musculoskeletal chest pain 03/03/2024   Osteoarthritis    Osteopenia    Other disorders of phosphorus metabolism 01/08/2020   PAC (premature atrial contraction) 10/04/2020   Pain, unspecified 12/06/2019   PAT (paroxysmal atrial tachycardia) ([name]) 10/04/2020   Peripheral vascular disease, unspecified ([name]) critical stenosis lower extremity 12/01/2023   Peripheral arterial disease     Pneumonia    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  Chodri CT annually.     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 dialysis, with long-term current use of insulin  ([name]) 12/23/2015   Type 2 diabetes mellitus, with long-term current use of insulin  ([name]) 03/11/2019   Unspecified hemorrhoids 01/08/2020    Past Surgical History:  Procedure Laterality Date   A/V FISTULAGRAM Left 02/17/2021   Procedure: A/V FISTULAGRAM;  Surgeon: Sheree Penne Bruckner, MD;  Location: [facility] INVASIVE CV LAB;  Service: Cardiovascular;  Laterality: Left;   APPENDECTOMY     AV FISTULA PLACEMENT Left    AV FISTULA PLACEMENT Left 03/12/2021   Procedure: INSERTION OF LEFT UPPER ARM ARTERIOVENOUS (AV) GORE-TEX GRAFT;  Surgeon: Sheree Penne Bruckner, MD;  Location: [facility] OR;  Service: Vascular;  Laterality: Left;   BALLOON ENTEROSCOPY  03/02/2024   Procedure: ENTEROSCOPY, USING BALLOON;  Surgeon: San Sandor GAILS, DO;  Location: [facility] ENDOSCOPY;  Service: Gastroenterology;;   COLONOSCOPY WITH PROPOFOL  N/A 03/18/2021   Procedure: COLONOSCOPY WITH PROPOFOL ;  Surgeon: Legrand Victory LITTIE DOUGLAS, MD;  Location: [facility] ENDOSCOPY;  Service: Gastroenterology;  Laterality: N/A;   CYSTECTOMY     ENTEROSCOPY N/A  03/21/2021   Procedure: ENTEROSCOPY;  Surgeon: Legrand Victory LITTIE DOUGLAS, MD;  Location: [facility] ENDOSCOPY;  Service: Gastroenterology;  Laterality: N/A;   ENTEROSCOPY N/A 12/20/2021   Procedure: ENTEROSCOPY;  Surgeon: Legrand Victory LITTIE DOUGLAS, MD;  Location: [facility] ENDOSCOPY;  Service: Gastroenterology;  Laterality: N/A;   ENTEROSCOPY N/A 02/26/2024   Procedure: ENTEROSCOPY;  Surgeon: Albertus Gordy HERO, MD;  Location: [facility] ENDOSCOPY;  Service: Gastroenterology;  Laterality: N/A;   ENTEROSCOPY N/A 02/28/2024   Procedure: ENTEROSCOPY;  Surgeon: San Sandor GAILS, DO;  Location: [facility] ENDOSCOPY;  Service: Gastroenterology;  Laterality: N/A;   ESOPHAGOGASTRODUODENOSCOPY (EGD) WITH PROPOFOL  N/A 03/18/2021   Procedure: ESOPHAGOGASTRODUODENOSCOPY (EGD) WITH PROPOFOL ;  Surgeon: Legrand Victory LITTIE DOUGLAS, MD;  Location: [facility] ENDOSCOPY;  Service: Gastroenterology;  Laterality: N/A;   GIVENS CAPSULE STUDY N/A 03/19/2021   Procedure: GIVENS CAPSULE STUDY;  Surgeon: Legrand Victory LITTIE DOUGLAS, MD;  Location: [facility] ENDOSCOPY;  Service: Gastroenterology;  Laterality: N/A;   GIVENS CAPSULE STUDY N/A 02/29/2024   Procedure: IMAGING PROCEDURE, GI TRACT, INTRALUMINAL, VIA CAPSULE;  Surgeon: San Sandor GAILS, DO;  Location: [facility] ENDOSCOPY;  Service: Gastroenterology;  Laterality: N/A;   HEMOSTASIS CLIP PLACEMENT  12/20/2021   Procedure: HEMOSTASIS CLIP PLACEMENT;  Surgeon: Legrand Victory LITTIE DOUGLAS, MD;  Location: [facility] ENDOSCOPY;  Service: Gastroenterology;;   HOT HEMOSTASIS N/A 03/21/2021  Procedure: HOT HEMOSTASIS (ARGON PLASMA COAGULATION/BICAP);  Surgeon: Legrand Victory LITTIE DOUGLAS, MD;  Location: [facility] ENDOSCOPY;  Service: Gastroenterology;  Laterality: N/A;   HOT HEMOSTASIS N/A 12/20/2021   Procedure: HOT HEMOSTASIS (ARGON PLASMA COAGULATION/BICAP);  Surgeon: Legrand Victory LITTIE DOUGLAS, MD;  Location: [facility] ENDOSCOPY;  Service: Gastroenterology;  Laterality: N/A;   HOT HEMOSTASIS N/A 03/02/2024   Procedure: EGD, WITH ARGON PLASMA COAGULATION;  Surgeon: San Sandor GAILS, DO;  Location: [facility]  ENDOSCOPY;  Service: Gastroenterology;  Laterality: N/A;   HYSTEROSCOPY     IR THROMBECTOMY AV FISTULA W/THROMBOLYSIS/PTA INC/SHUNT/IMG LEFT Left 04/12/2024   IR US  GUIDE VASC ACCESS LEFT  04/12/2024   LOWER EXTREMITY ANGIOGRAPHY N/A 02/03/2024   Procedure: Lower Extremity Angiography;  Surgeon: Court Dorn PARAS, MD;  Location: [facility] INVASIVE CV LAB;  Service: Cardiovascular;  Laterality: N/A;   POLYPECTOMY  03/18/2021   Procedure: POLYPECTOMY;  Surgeon: Legrand Victory LITTIE DOUGLAS, MD;  Location: [facility] ENDOSCOPY;  Service: Gastroenterology;;    Family Hx:  Family History  Problem Relation Age of Onset   Diabetes Mother    Hypertension Mother    Diabetes Maternal Grandfather    Liver disease Other     Social History:  reports that she has quit smoking. Her smoking use included cigarettes. She has never used smokeless tobacco. She reports that she does not currently use alcohol . She reports that she does not use drugs.  Allergies:  Allergies  Allergen Reactions   Atorvastatin  Other (See Comments)    Constipation    Medications: Prior to Admission medications   Medication Sig Start Date End Date Taking? Authorizing Provider  acetaminophen  (TYLENOL ) 500 MG tablet Take 500 mg by mouth every 6 (six) hours as needed for mild pain (pain score 1-3) or moderate pain (pain score 4-6).    [provider]  ascorbic acid (VITAMIN C) 1000 MG tablet Take 1,000 mg by mouth at bedtime. 12/07/19   [provider]  aspirin  EC 81 MG tablet Take 1 tablet (81 mg total) by mouth daily. Swallow whole. 12/01/23   Krasowski, Robert J, MD  Biotin 5000 MCG TABS Take 5,000 mcg by mouth daily in the afternoon.    [provider]  carboxymethylcellulose (REFRESH PLUS) 0.5 % SOLN Place 1 drop into both eyes 3 (three) times daily as needed (dry eyes).    [provider]  carvedilol  (COREG ) 6.25 MG tablet Take 6.25 mg by mouth daily. 12/07/19   [provider]  cyanocobalamin (VITAMIN B12)  1000 MCG tablet Take 1,000 mcg by mouth every evening.    [provider]  doxycycline  (VIBRA -TABS) 100 MG tablet Take 1 tablet (100 mg total) by mouth every 12 (twelve) hours. 04/14/24   Amoako, Prince, MD  ferric citrate  (AURYXIA ) 1 GM 210 MG(Fe) tablet Take 210 mg by mouth 3 (three) times daily with meals.    [provider]  fluticasone  (FLONASE ) 50 MCG/ACT nasal spray Place 1 spray into both nostrils daily as needed (Congestion).    [provider]  furosemide  (LASIX ) 80 MG tablet Take 80 mg by mouth daily in the afternoon. 04/03/21   [provider]  hydrocortisone  1 % ointment Apply 1 Application topically 2 (two) times daily. 04/14/24   Amoako, Prince, MD  ibuprofen (ADVIL) 200 MG tablet Take 200-400 mg by mouth every 6 (six) hours as needed for mild pain (pain score 1-3).    [provider]  Insulin  Glargine-Lixisenatide  (SOLIQUA ) 100-33 UNT-MCG/ML SOPN Inject 6 Units into the skin in the  morning. 03/11/24   Zheng, Michael, DO  Lactobacillus (PROBIOTIC ACIDOPHILUS PO) Take 1 capsule by mouth in the morning.    [provider]  lidocaine  (LIDODERM ) 5 % Place 1 patch onto the skin daily. Remove & Discard patch within 12 hours or as directed by MD Patient not taking: Reported on 04/10/2024 03/11/24   Zheng, Michael, DO  lidocaine -prilocaine  (EMLA ) cream Apply 1 application topically as needed (prior to fisula access). Tuesday, Thursday, Saturday for dialysis 07/04/20   [provider]  meloxicam (MOBIC) 7.5 MG tablet Take 7.5-15 mg by mouth daily as needed for pain. 04/05/24   [provider]  Menthol -Methyl Salicylate  (MUSCLE RUB) 10-15 % CREA Apply 1 Application topically 2 (two) times daily. Apply to left shoulder. Patient taking differently: Apply 1 Application topically 2 (two) times daily as needed for muscle pain. Apply to left shoulder. 03/11/24   Elicia Sharper, DO  Omega-3 Fatty Acids (FISH OIL PO) Take 1 capsule by mouth in  the morning and at bedtime.    [provider]  pantoprazole  (PROTONIX ) 40 MG tablet Take 1 tablet (40 mg total) by mouth 2 (two) times daily. 03/11/24   Zheng, Michael, DO  polyethylene glycol (MIRALAX  / GLYCOLAX ) 17 g packet Take 17 g by mouth 2 (two) times daily as needed for moderate constipation.    [provider]  senna (SENOKOT) 8.6 MG TABS tablet Take 1 tablet (8.6 mg total) by mouth daily as needed for mild constipation. 04/14/24   Amoako, Prince, MD  Vitamin E 670 MG (1000 UT) CAPS Take 1,000 Units by mouth in the morning. 01/09/20   [provider]    I have reviewed the patient's current and reported prior to admission medications.  Labs:     Latest Ref Rng & Units 04/17/2024    4:31 PM 04/14/2024   10:58 AM 04/13/2024   12:58 PM  BMP  Glucose 70 - 99 mg/dL 814  775  763   BUN 8 - 23 mg/dL 57  36  44   Creatinine 0.44 - 1.00 mg/dL 4.90  6.04  5.55   Sodium 135 - 145 mmol/L 128  131  130   Potassium 3.5 - 5.1 mmol/L 4.9  4.0  4.3   Chloride 98 - 111 mmol/L 89  90  90   CO2 22 - 32 mmol/L 23  24  23    Calcium  8.9 - 10.3 mg/dL 9.0  8.8  8.6     Urinalysis    Component Value Date/Time   COLORURINE YELLOW 04/10/2024 1707   APPEARANCEUR CLOUDY (A) 04/10/2024 1707   LABSPEC 1.025 04/10/2024 1707   PHURINE 5.5 04/10/2024 1707   GLUCOSEU NEGATIVE 04/10/2024 1707   HGBUR MODERATE (A) 04/10/2024 1707   BILIRUBINUR SMALL (A) 04/10/2024 1707   KETONESUR 15 (A) 04/10/2024 1707   PROTEINUR 100 (A) 04/10/2024 1707   NITRITE NEGATIVE 04/10/2024 1707   LEUKOCYTESUR NEGATIVE 04/10/2024 1707     ROS:  Pertinent items noted in HPI and remainder of comprehensive ROS otherwise negative.  Physical Exam: Vitals:   04/17/24 1830 04/17/24 1845  BP: (!) 137/56 (!) 131/54  Pulse: 87 92  Resp: (!) 21 17  Temp:    SpO2: 100% 92%     General:  elderly female in bed in NAD at rest HEENT: NCAT other than bruising on face Eyes: EOMI sclera anicteric Neck:  supple trachea midline  Heart: S1S2 no rub Lungs: clear to auscultation on the right and decreased breath sounds  on the left; on room air Abdomen: soft/nt/nd; obese habitus  Extremities: 1-2+ edema lower extremities Skin:bruising on lower face Neuro: alert and conversant provides hx and follows commands  Access LUE AVG with bruit   Outpatient HD orders:  TTS  [place] EDW 71 kg 3.5 hours 2K/2.5 Ca BF 400  DF auto 1.5 Last HD on 6/21 with post weight of 72.7 kg Last ESA on 04/14/24 - 40 mcg aranesp  and was to begin mircera 150 mcg every 2 weeks outpatient but hadn't gotten a dose Other meds: calcitriol  0.25 mcg three times a week   Assessment/Plan:    # End-stage renal disease on hemodialysis - HD tomorrow per TTS schedule.  UF as tolerated - No emergent indication for dialysis tonight  # Acute Gl bleed  - Stop any NSAID's   # Anemia of acute blood loss and CKD - PRBC's per primary team - see that 2 units are ordered and would give 1 unit and reassess tomorrow rather than two if otherwise stable  - Recently given ESA on 6/20 here  # HTN  - hx of such  - recent admission with shock and now with GI bleed - Acceptable on current regimen  - optimize volume as able with HD  # Metabolic bone disease - here with GI bleed.  Resume home binders when taking PO   Thank you for the consult.  Please do not hesitate to contact me with any questions regarding our patient.    Katheryn [name] Saba 04/17/2024, 9:06 PM

## 2024-04-17 NOTE — ED Notes (Signed)
 Assumed care on patient , patient signed consent form for patient .

## 2024-04-17 NOTE — ED Notes (Signed)
 Pt doesn't make much urine

## 2024-04-17 NOTE — ED Notes (Signed)
Patient transported to CT SCAN . 

## 2024-04-17 NOTE — ED Notes (Signed)
 Patient completed 1 unit PRBC with no adverse effect , denies pain/respirations unlabored . Afebrile .

## 2024-04-17 NOTE — ED Triage Notes (Addendum)
 Pt is here with feeling of sick on stomach, rectal bleeding, weakness. Pt is HD patient and just discharged from hospital on Friday. Pt is pale. Last HD on Saturday. Pt has left upper HD graft. Pt reports sob as well. Pt has history of falls and has bruises to face that is healing.

## 2024-04-17 NOTE — Hospital Course (Addendum)
 Anna Carr is a 83 year-old female with PMH of ESRD on HD TTS, HFpEF, T2DM, HTN who presented for weakness and lightheadedness in the setting of hematochezia and admitted on 04/17/2024 for symptomatic anemia likely secondary to an upper GI bleed.   #Symptomatic anemia, acute on chronic anemia of chronic disease #Upper GI bleed Patient presented after having 2-3 episodes of hematochezia at home. Initial labs notable for hemoglobin of 6.9, so she received 1u pRBCs with post-transfusion hemoglobin 8.2. 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 Patient was treated with IV pantoprazole , which was transitioned to PO pantoprazole  40 mg daily by discharge. Hemoglobin remained stable at 8.4. Coordinated with outpatient dialysis for patient to receive transfusion if Hgb <7 when checked 1-2 times weekly.   #HFpEF Patient initially appeared volume overloaded on exam with 2+ pitting LE edema to knees, though no crackles were present on lung auscultation. Volume status slightly improved after hemodialysis with 2+ pitting LE edema to mid-shin. Her home Lasix  80 mg daily and home carvedilol  6.25 mg BID were continued by discharge, which should continue to help with volume status.   #ESRD on HD TTS #Hyponatremia Patient is on TTS hemodialysis outpatient for ESRD with last session on 6/21 before admission. Nephrology was consulted, and patient was continued on regular dialysis schedule. Last session before discharge was on 6/24. Patient to resume outpatient dialysis as scheduled.   #T2DM Most recent HgbA1c 5.8% in 02/2024. Glucose stable between 115-256 during admission, so her home Soliqua  6 units every morning was held while hospitalized. She was instructed to restart her home insulin  dose at discharge.   #Hypertension Patient was  initially hypotensive to 82/70, so her home Lasix  and carvedilol  were held. After receiving 1u pRBCs, her blood pressure improved to SBPs 118-135, so her home Lasix  80 mg daily and home carvedilol  6.25 mg BID were restarted by discharge.   #Wound Care Patient was noted to have (1) full thickness wounds on right elbow, right knee, right lower leg, and right second digit related to a fall, (2) full thickness right heel pinpoint area draining, and (3) left heel Stage 1 pressure injury. Wound care was consulted to help with management. Ordered home health to help with wound care at discharge.

## 2024-04-17 NOTE — ED Provider Notes (Signed)
 [place] EMERGENCY DEPARTMENT AT [facility] Provider Note   CSN: [id number] Arrival date & time: 04/17/24  1520     Patient presents with: Weakness and Shortness of Breath   Anna Carr is a 83 y.o. female.    Weakness Associated symptoms: shortness of breath   Shortness of Breath Patient presents for hematochezia and generalized weakness.  Medical history includes CHF, ESRD, COPD, HTN, depression, hyponatremia, mitral regurgitation, anemia, chronic constipation, DM, prior GI bleed.  She was admitted to the hospital 1 week ago for weakness and encephalopathy.  This was in the setting of a GI bleed.  Gastroenterology did see her but did not perform any interventions.  Her hemoglobin remained stable while admitted.  She was diagnosed with pneumonia and started on antibiotics.  She required ICU stay for vasopressor support.  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  She subsequently was able to undergo dialysis while in the hospital.  She had desaturations at night as well as with ambulation during the day.  She was sent home on portable oxygen 3 days ago.  Starting today, patient had dark red blood in stool.  She had approximately 3 episodes of this.  She does have generalized weakness.  She did complete her antibiotics yesterday.  She is not on a blood thinner.     Prior to Admission medications   Medication Sig Start Date End Date Taking? Authorizing Provider  acetaminophen  (TYLENOL ) 500 MG tablet Take 500 mg by mouth every 6 (six) hours as needed for mild pain (pain score 1-3) or moderate pain (pain score 4-6).    [provider]  ascorbic acid (VITAMIN C) 1000 MG tablet Take 1,000 mg by mouth at bedtime. 12/07/19   [provider]  aspirin  EC 81 MG tablet Take 1 tablet (81 mg total) by mouth daily. Swallow whole. 12/01/23   Krasowski, Robert J, MD  Biotin 5000 MCG TABS Take 5,000 mcg by mouth daily in the  afternoon.    [provider]  carboxymethylcellulose (REFRESH PLUS) 0.5 % SOLN Place 1 drop into both eyes 3 (three) times daily as needed (dry eyes).    [provider]  carvedilol  (COREG ) 6.25 MG tablet Take 6.25 mg by mouth daily. 12/07/19   [provider]  cyanocobalamin (VITAMIN B12) 1000 MCG tablet Take 1,000 mcg by mouth every evening.    [provider]  doxycycline  (VIBRA -TABS) 100 MG tablet Take 1 tablet (100 mg total) by mouth every 12 (twelve) hours. 04/14/24   Amoako, Prince, MD  ferric citrate  (AURYXIA ) 1 GM 210 MG(Fe) tablet Take 210 mg by mouth 3 (three) times daily with meals.    [provider]  fluticasone  (FLONASE ) 50 MCG/ACT nasal spray Place 1 spray into both nostrils daily as needed (Congestion).    [provider]  furosemide  (LASIX ) 80 MG tablet Take 80 mg by mouth daily in the afternoon. 04/03/21   [provider]  hydrocortisone  1 % ointment Apply 1 Application topically 2 (two) times daily. 04/14/24   Amoako, Prince, MD  ibuprofen (ADVIL) 200 MG tablet Take 200-400 mg by mouth every 6 (six) hours as needed for mild pain (pain score 1-3).    [provider]  Insulin  Glargine-Lixisenatide  (SOLIQUA ) 100-33 UNT-MCG/ML SOPN Inject 6 Units into the skin in the morning. 03/11/24   Zheng, Michael, DO  Lactobacillus (PROBIOTIC ACIDOPHILUS PO) Take 1 capsule by mouth in the morning.  [provider]  lidocaine  (LIDODERM ) 5 % Place 1 patch onto the skin daily. Remove & Discard patch within 12 hours or as directed by MD Patient not taking: Reported on 04/10/2024 03/11/24   Zheng, Michael, DO  lidocaine -prilocaine  (EMLA ) cream Apply 1 application topically as needed (prior to fisula access). Tuesday, Thursday, Saturday for dialysis 07/04/20   [provider]  meloxicam (MOBIC) 7.5 MG tablet Take 7.5-15 mg by mouth daily as needed for pain. 04/05/24   [provider]  Menthol -Methyl Salicylate   (MUSCLE RUB) 10-15 % CREA Apply 1 Application topically 2 (two) times daily. Apply to left shoulder. Patient taking differently: Apply 1 Application topically 2 (two) times daily as needed for muscle pain. Apply to left shoulder. 03/11/24   Elicia Sharper, DO  Omega-3 Fatty Acids (FISH OIL PO) Take 1 capsule by mouth in the morning and at bedtime.    [provider]  pantoprazole  (PROTONIX ) 40 MG tablet Take 1 tablet (40 mg total) by mouth 2 (two) times daily. 03/11/24   Zheng, Michael, DO  polyethylene glycol (MIRALAX  / GLYCOLAX ) 17 g packet Take 17 g by mouth 2 (two) times daily as needed for moderate constipation.    [provider]  senna (SENOKOT) 8.6 MG TABS tablet Take 1 tablet (8.6 mg total) by mouth daily as needed for mild constipation. 04/14/24   Amoako, Prince, MD  Vitamin E 670 MG (1000 UT) CAPS Take 1,000 Units by mouth in the morning. 01/09/20   [provider]    Allergies: Atorvastatin     Review of Systems  Constitutional:  Positive for fatigue.  Respiratory:  Positive for shortness of breath.   Gastrointestinal:  Positive for blood in stool.  Neurological:  Positive for weakness (Generalized).  All other systems reviewed and are negative.   Updated Vital Signs BP 98/73   Pulse 92   Temp 97.7 F (36.5 C)   Resp 18   SpO2 99%   Physical Exam Vitals and nursing note reviewed.  Constitutional:      General: She is not in acute distress.    Appearance: She is well-developed. She is ill-appearing. She is not toxic-appearing or diaphoretic.  HENT:     Head: Normocephalic and atraumatic.     Comments: Old bruising to left side of face    Mouth/Throat:     Mouth: Mucous membranes are moist.     Pharynx: Oropharynx is clear.   Eyes:     Conjunctiva/sclera: Conjunctivae normal.    Cardiovascular:     Rate and Rhythm: Normal rate and regular rhythm.  Pulmonary:     Effort: Pulmonary effort is normal. No respiratory distress.     Breath  sounds: Normal breath sounds. No decreased breath sounds, wheezing or rhonchi.  Abdominal:     Palpations: Abdomen is soft.     Tenderness: There is no abdominal tenderness.   Musculoskeletal:        General: No swelling.     Cervical back: Normal range of motion and neck supple.     Right lower leg: Edema present.     Left lower leg: Edema present.   Skin:    General: Skin is warm and dry.     Coloration: Skin is pale. Skin is not cyanotic.   Neurological:     General: No focal deficit present.     Mental Status: She is alert and oriented to person, place, and time.   Psychiatric:        Mood  and Affect: Mood normal.        Behavior: Behavior normal.     (all labs ordered are listed, but only abnormal results are displayed) Labs Reviewed  COMPREHENSIVE METABOLIC PANEL WITH GFR - Abnormal; Notable for the following components:      Result Value   Sodium 128 (*)    Chloride 89 (*)    Glucose, Bld 185 (*)    BUN 57 (*)    Creatinine, Ser 5.09 (*)    Total Protein 5.5 (*)    Albumin  2.4 (*)    GFR, Estimated 8 (*)    Anion gap 16 (*)    All other components within normal limits  CBC - Abnormal; Notable for the following components:   RBC 2.17 (*)    Hemoglobin 6.9 (*)    HCT 22.2 (*)    MCV 102.3 (*)    RDW 18.9 (*)    nRBC 1.2 (*)    All other components within normal limits  CBG MONITORING, ED - Abnormal; Notable for the following components:   Glucose-Capillary 189 (*)    All other components within normal limits  URINALYSIS, ROUTINE W REFLEX MICROSCOPIC  HEPATITIS B SURFACE ANTIGEN  HEPATITIS B SURFACE ANTIBODY, QUANTITATIVE  PREPARE RBC (CROSSMATCH)  TYPE AND SCREEN    EKG: EKG Interpretation Date/Time:  Monday April 17 2024 16:29:06 EDT Ventricular Rate:  79 PR Interval:  116 QRS Duration:  88 QT Interval:  416 QTC Calculation: 477 R Axis:   94  Text Interpretation: Sinus rhythm with Premature supraventricular complexes Rightward axis Cannot rule  out Anterior infarct , age undetermined Confirmed by Melvenia Motto [phone]) on 04/17/2024 6:39:00 PM  Radiology: CT Angio Abd/Pel W and/or Wo Contrast Result Date: 04/17/2024 CLINICAL DATA:  Lower GI bleeding EXAM: CTA ABDOMEN AND PELVIS WITHOUT AND WITH CONTRAST TECHNIQUE: Multidetector CT imaging of the abdomen and pelvis was performed using the standard protocol during bolus administration of intravenous contrast. Multiplanar reconstructed images and MIPs were obtained and reviewed to evaluate the vascular anatomy. RADIATION DOSE REDUCTION: This exam was performed according to the departmental dose-optimization program which includes automated exposure control, adjustment of the mA and/or kV according to patient size and/or use of iterative reconstruction technique. CONTRAST:  75mL OMNIPAQUE  IOHEXOL  350 MG/ML SOLN COMPARISON:  None Available. FINDINGS: VASCULAR Aorta: Abdominal aorta demonstrates diffuse atherosclerotic calcifications. No aneurysmal dilatation or dissection is noted. Celiac: Heavy calcification is noted in the origin with only mild stenosis. SMA: Heavy calcification is noted in the origin with only mild stenosis. Renals: Renal arteries demonstrate atherosclerotic calcification. The right renal arteries are diminutive. High-grade stenosis in the left renal artery is noted as well. IMA: Patent without evidence of aneurysm, dissection, vasculitis or significant stenosis. Inflow: Atherosclerotic calcifications are noted with mild areas of stenoses throughout system. The common femoral artery on right is heavily calcified with significant stenosis noted. Stenosis at the origin of the right SFA is seen as well. Veins: Venous structures show no specific abnormality. Review of the MIP images confirms the above findings. NON-VASCULAR Lower chest: Bilateral pleural effusions are noted right considerably larger than left. Mild basilar atelectasis is noted bilaterally. Hepatobiliary: Gallbladder is well  distended with multiple gallstones. No definitive complicating factors are seen. Heterogeneity of the liver is noted without focal mass. This may be related to the timing of the contrast bolus. Pancreas: Unremarkable. No pancreatic ductal dilatation or surrounding inflammatory changes. Spleen: Normal in size without focal abnormality. Adrenals/Urinary Tract: Adrenal glands are within  normal limits. Kidneys are atrophic consistent with renal arterial disease. Bladder is decompressed. Stomach/Bowel: Scattered diverticular change of the colon is noted without evidence of diverticulitis. The appendix has been surgically removed. Small bowel and stomach appear within normal limits. No active extravasation or pooling of contrast on delayed images is identified to suggest acute GI hemorrhage. Lymphatic: No significant lymphadenopathy is noted. Reproductive: Uterus and bilateral adnexa are unremarkable. Other: Mild free fluid in the pelvis is noted of uncertain significance. No herniation is seen. Mild changes of anasarca are noted. Musculoskeletal: Bony structures are within normal limits. IMPRESSION: VASCULAR Extensive atherosclerotic calcifications are noted. Diminutive renal arteries with associated renal atrophy. No evidence of active extravasation or delayed pooling to suggest active GI hemorrhage. NON-VASCULAR Diverticulosis without diverticulitis. Large bilateral pleural effusions with associated atelectatic changes. Cholelithiasis without complicating factors. Mild free fluid in the pelvis with changes of anasarca. Electronically Signed   By: Oneil Devonshire M.D.   On: 04/17/2024 21:24     Procedures   Medications Ordered in the ED  0.9 %  sodium chloride  infusion (Manually program via Guardrails IV Fluids) ( Intravenous Not Given 04/17/24 2103)  pantoprazole  (PROTONIX ) injection 40 mg (40 mg Intravenous Given 04/17/24 1904)  Chlorhexidine  Gluconate Cloth 2 % PADS 6 each (has no administration in time range)   iohexol  (OMNIPAQUE ) 350 MG/ML injection 75 mL (75 mLs Intravenous Contrast Given 04/17/24 2048)                                    Medical Decision Making Amount and/or Complexity of Data Reviewed Labs: ordered. Radiology: ordered.  Risk Prescription drug management.   This patient presents to the ED for concern of blood in stool, generalized weakness, this involves an extensive number of treatment options, and is a complaint that carries with it a high risk of complications and morbidity.  The differential diagnosis includes colitis, diverticular bleed, intestinal AVM, upper GI bleed   Co morbidities / Chronic conditions that complicate the patient evaluation  CHF, ESRD, COPD, HTN, depression, hyponatremia, mitral regurgitation, anemia, chronic constipation, DM, prior GI bleed   Additional history obtained:  Additional history obtained from EMR External records from outside source obtained and reviewed including patient's family   Lab Tests:  I Ordered, and personally interpreted labs.  The pertinent results include: Hemoglobin today is 6.9.  This is a 30% drop from lab work from 3 days ago.  Other lab work notable for elevated creatinine and BUN consistent with ESRD.  Sodium is slightly lower than at time of recent discharge   Imaging Studies ordered:  I ordered imaging studies including CTA of abdomen and pelvis I independently visualized and interpreted imaging which showed no evidence of active extravasation; bilateral pleural effusions and mild free fluid in pelvis consistent with anasarca. I agree with the radiologist interpretation   Cardiac Monitoring: / EKG:  The patient was maintained on a cardiac monitor.  I personally viewed and interpreted the cardiac monitored which showed an underlying rhythm of: Sinus rhythm   Problem List / ED Course / Critical interventions / Medication management  Patient presenting for worsening GI bleeding and generalized  weakness.  She has history of prior GI bleeding.  She is not on a blood thinner currently.  She was admitted to the hospital a week ago and treated for pneumonia.  She had some trace blood in stool at that time.  This morning,  she has large-volume dark red stool.  Vital signs on arrival are notable for widened pulse pressure.  Hemoglobin at time of discharge was 9.8.  Today it is 6.9.  Patient was consented for blood transfusion.  PRBCs were ordered.  I spoke with nephrologist on-call, Dr. Jerrye, who will see in consult and arrange for dialysis.  He recommends 1 unit of blood being given tonight and second unit being given during dialysis.  Nursing was notified.  I spoke with gastroenterologist on-call, Dr. Abran, who will see in consult.  Patient remained hemodynamically stable.  CTA did not show any evidence of active extravasation.  She was admitted for further management. I ordered medication including Protonix  for GI bleed; PRBCs for symptomatic anemia Reevaluation of the patient after these medicines showed that the patient improved I have reviewed the patients home medicines and have made adjustments as needed   Consultations Obtained:  I requested consultation with the nephrologist and gastroenterologist, as noted above,  and discussed lab and imaging findings as well as pertinent plan - they recommend: Both will see in consult.   Social Determinants of Health:  Frequent hospitalizations  CRITICAL CARE Performed by: Bernardino Fireman   Total critical care time: 34 minutes  Critical care time was exclusive of separately billable procedures and treating other patients.  Critical care was necessary to treat or prevent imminent or life-threatening deterioration.  Critical care was time spent personally by me on the following activities: development of treatment plan with patient and/or surrogate as well as nursing, discussions with consultants, evaluation of patient's response to treatment,  examination of patient, obtaining history from patient or surrogate, ordering and performing treatments and interventions, ordering and review of laboratory studies, ordering and review of radiographic studies, pulse oximetry and re-evaluation of patient's condition.      Final diagnoses:  Hematochezia  Symptomatic anemia    ED Discharge Orders     None          Fireman Bernardino, MD 04/17/24 2230

## 2024-04-17 NOTE — H&P (Incomplete)
 Date: 04/18/2024               Patient Name:  Anna Carr MRN: [id number]  DOB: 1941-01-27 Age / Sex: 83 y.o., female   PCP: Thurmond Cathlyn LABOR., MD         Medical Service: Internal Medicine Teaching Service         Attending Physician: Dr. Lovie Clarity, MD      First Contact: Dr. Drue Grow, MD    Second Contact: Dr. Roetta Chars, MD          After Hours (After 5p/  First Contact Pager: [phone]  weekends / holidays): Second Contact Pager: [phone]   SUBJECTIVE   Chief Complaint: Lightheadedness and weakness  History of Present Illness: This is a 83 year old female with a past medical history of HFpEF, ESRD, type 2 diabetes who presents to the emergency department after having 2-3 episodes of bloody bowel movements.  She states she left the hospital on 04/14/2024 and was feeling weak.  She did improve but then 2 days ago she developed weakness and lightheadedness.  She started having bloody bowel movements this morning.  Her daughter noticed as well.  With her history of GI bleeds, they called EMS and EMS brought her to the emergency department.  She also reports having a cough which has slowly improved.  She does still have some pleuritic chest pain with cough.  She denies any phlegm.  Patient has not had any episodes where she has passed out.  She does report having some lower extremity edema.  ED Course: Initial vital signs showed blood pressure 125/42, pulse 81, afebrile, satting at 97% on room air.  Labs showed evidence of hyponatremia at 128, creatinine 5.09, hemoglobin at 6.9.  GI was consulted in emergency department.  Recommended medicine admission.  IMTS was consulted for admission.   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 difficile colitis 03/30/2021   Cardiac arrest ([name]) 03/02/2024   Cellulitis 03/16/2021   CHF with left ventricular diastolic dysfunction, NYHA class 1 ([name]) 02/02/2017   Cholelithiasis 03/30/2021   Chronic constipation 12/22/2015   Chronic laryngitis 02/17/2017   Last Assessment & Plan:   Formatting of this note might be different from the original.  Evaluation of larynx.  Was hospitalized for pneumonia and during that time she started noticing worsening hoarseness.  Denies any throat pain.  No obvious heartburn.  She did recently start PPI therapy.  She smoked in the past.  Denies any difficulty swallowing.  Hoarseness is improving.  EXAM shows mildly ras   Coagulation defect, unspecified ([name]) 12/07/2019   Colon polyp    Complication of vascular dialysis catheter 03/05/2020   Contact with and (suspected) exposure to covid-19 10/28/2020   COPD mixed type ([name])    Cough, unspecified 10/28/2020   COVID-19 10/29/2020  Depression    Diverticulosis of large intestine without perforation or abscess without bleeding 01/08/2020   Dyspnea on exertion    Dyspnea, unspecified 11/02/2019   Elevated troponin 03/16/2021   Encounter for removal of sutures 12/28/2019   Encounter for screening for respiratory tuberculosis 12/06/2019   ESRD (end stage renal disease) ([name]) 07/18/2019   ESRD on dialysis ([name]) 09/18/2019   ESRD on hemodialysis ([name]) 01/02/2020   Fever, unspecified 01/23/2020   GI bleed 12/19/2021   GIB (gastrointestinal bleeding) 01/02/2020   Heme  positive stool    High risk medication use 12/22/2015   History of blood transfusion    History of colonic polyps 01/08/2020   HLD (hyperlipidemia) 12/22/2015   Hypertension    Hypervolemia 11/13/2019   Hypokalemia 03/11/2019   Hyponatremia 03/11/2019   Hypovolemia 10/27/2021   Hypoxia 03/11/2019   Insomnia    Insulin  long-term use ([name]) 12/22/2015   Iron  deficiency anemia, unspecified 12/06/2019   Major depressive disorder, recurrent ([name]) 12/22/2015   Malaise and fatigue 12/23/2015   Mild protein-calorie malnutrition ([name]) 03/26/2020   Mitral regurgitation moderate to severe based on echocardiogram from 2022 11/03/2021   Musculoskeletal chest pain 03/03/2024   Osteoarthritis    Osteopenia    Other disorders of phosphorus metabolism 01/08/2020   PAC (premature atrial contraction) 10/04/2020   Pain, unspecified 12/06/2019   PAT (paroxysmal atrial tachycardia) ([name]) 10/04/2020   Peripheral vascular disease, unspecified ([name]) critical stenosis lower extremity 12/01/2023   Peripheral arterial disease     Pneumonia    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  Chodri CT annually.     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    Past Surgical History  Past Surgical History:  Procedure Laterality Date   A/V FISTULAGRAM Left 02/17/2021   Procedure: A/V  FISTULAGRAM;  Surgeon: Sheree Penne Bruckner, MD;  Location: [facility] INVASIVE CV LAB;  Service: Cardiovascular;  Laterality: Left;   APPENDECTOMY     AV FISTULA PLACEMENT Left    AV FISTULA PLACEMENT Left 03/12/2021   Procedure: INSERTION OF LEFT UPPER ARM ARTERIOVENOUS (AV) GORE-TEX GRAFT;  Surgeon: Sheree Penne Bruckner, MD;  Location: [facility] OR;  Service: Vascular;  Laterality: Left;   BALLOON ENTEROSCOPY  03/02/2024   Procedure: ENTEROSCOPY, USING BALLOON;  Surgeon: San Sandor GAILS, DO;  Location: [facility] ENDOSCOPY;  Service: Gastroenterology;;   COLONOSCOPY WITH PROPOFOL  N/A 03/18/2021   Procedure: COLONOSCOPY WITH PROPOFOL ;  Surgeon: Legrand Victory LITTIE DOUGLAS, MD;  Location: [facility] ENDOSCOPY;  Service: Gastroenterology;  Laterality: N/A;   CYSTECTOMY     ENTEROSCOPY N/A 03/21/2021   Procedure: ENTEROSCOPY;  Surgeon: Legrand Victory LITTIE DOUGLAS, MD;  Location: [facility] ENDOSCOPY;  Service: Gastroenterology;  Laterality: N/A;   ENTEROSCOPY N/A 12/20/2021   Procedure: ENTEROSCOPY;  Surgeon: Legrand Victory LITTIE DOUGLAS, MD;  Location: [facility] ENDOSCOPY;  Service: Gastroenterology;  Laterality: N/A;   ENTEROSCOPY N/A 02/26/2024   Procedure: ENTEROSCOPY;  Surgeon: Albertus Gordy HERO, MD;  Location: [facility]  ENDOSCOPY;  Service: Gastroenterology;  Laterality: N/A;   ENTEROSCOPY N/A 02/28/2024   Procedure: ENTEROSCOPY;  Surgeon: San Sandor GAILS, DO;  Location: [facility] ENDOSCOPY;  Service: Gastroenterology;  Laterality: N/A;   ESOPHAGOGASTRODUODENOSCOPY (EGD) WITH PROPOFOL  N/A 03/18/2021   Procedure: ESOPHAGOGASTRODUODENOSCOPY (EGD) WITH PROPOFOL ;  Surgeon: Legrand Victory LITTIE DOUGLAS, MD;  Location: [facility] ENDOSCOPY;  Service: Gastroenterology;  Laterality: N/A;   GIVENS CAPSULE STUDY N/A 03/19/2021   Procedure: GIVENS CAPSULE STUDY;  Surgeon: Legrand Victory LITTIE DOUGLAS, MD;  Location: [facility] ENDOSCOPY;  Service: Gastroenterology;  Laterality: N/A;   GIVENS CAPSULE STUDY N/A 02/29/2024   Procedure: IMAGING PROCEDURE, GI TRACT, INTRALUMINAL, VIA CAPSULE;  Surgeon: San Sandor GAILS,  DO;  Location: [facility] ENDOSCOPY;  Service: Gastroenterology;  Laterality: N/A;   HEMOSTASIS CLIP PLACEMENT  12/20/2021   Procedure: HEMOSTASIS CLIP PLACEMENT;  Surgeon: Legrand Victory LITTIE DOUGLAS, MD;  Location: [facility] ENDOSCOPY;  Service: Gastroenterology;;   HOT HEMOSTASIS N/A 03/21/2021   Procedure: HOT HEMOSTASIS (ARGON PLASMA COAGULATION/BICAP);  Surgeon: Legrand Victory LITTIE DOUGLAS, MD;  Location: [facility] ENDOSCOPY;  Service: Gastroenterology;  Laterality: N/A;   HOT HEMOSTASIS N/A 12/20/2021   Procedure: HOT HEMOSTASIS (ARGON PLASMA COAGULATION/BICAP);  Surgeon: Legrand Victory LITTIE DOUGLAS, MD;  Location: [facility] ENDOSCOPY;  Service: Gastroenterology;  Laterality: N/A;   HOT HEMOSTASIS N/A 03/02/2024   Procedure: EGD, WITH ARGON PLASMA COAGULATION;  Surgeon: San Sandor GAILS, DO;  Location: [facility] ENDOSCOPY;  Service: Gastroenterology;  Laterality: N/A;   HYSTEROSCOPY     IR THROMBECTOMY AV FISTULA W/THROMBOLYSIS/PTA INC/SHUNT/IMG LEFT Left 04/12/2024   IR US  GUIDE VASC ACCESS LEFT  04/12/2024   LOWER EXTREMITY ANGIOGRAPHY N/A 02/03/2024   Procedure: Lower Extremity Angiography;  Surgeon: Court Dorn PARAS, MD;  Location: [facility] INVASIVE CV LAB;  Service: Cardiovascular;  Laterality: N/A;   POLYPECTOMY  03/18/2021   Procedure: POLYPECTOMY;  Surgeon: Legrand Victory LITTIE DOUGLAS, MD;  Location: [facility] ENDOSCOPY;  Service: Gastroenterology;;    Social:  Lives With: Son, but daughter helps a lot Occupation: Retired Support: Good support in family Level of Function: Dependent and mostly all ADLs and IADLs ERE:Hmzh Thurmond, MD  Substances: No substance use  Family History: No pertinent family medical history  Allergies: Allergies as of 04/17/2024 - Review Complete 04/17/2024  Allergen Reaction Noted   Atorvastatin  Other (See Comments) 04/10/2024    Review of Systems: A complete ROS was negative except as per HPI.   OBJECTIVE:   Physical Exam: Blood pressure 98/73, pulse 92, temperature 97.7 F (36.5 C), resp. rate 18, SpO2 99%.    Constitutional: lying in bed, in no acute distress Eyes: conjunctiva with pallor  Cardiovascular: regular rate and rhythm, no m/r/g, bilateral 2+ LEE, 2+ DP puleses Pulmonary/Chest: normal work of breathing on room air, lungs clear to auscultation bilaterally Abdominal: soft, non-tender, non-distended MSK: left-sided AVF with palpable bruit Neurological: alert & oriented x 3, no focal deficit Skin: numerous scattered wounds/ecchymoses from fall a few weeks ago, all healing, no signs of infection, right-sided stage 2 heel ulcer (covered) Psych: normal mood and behavior   Labs: CBC    Component Value Date/Time   WBC 7.4 04/17/2024 1631   RBC 2.17 (L) 04/17/2024 1631   HGB 6.9 (LL) 04/17/2024 1631   HGB 11.3 01/12/2024 1259   HCT 22.2 (L) 04/17/2024 1631   HCT 35.0 01/12/2024 1259   PLT 234 04/17/2024 1631   PLT 208 01/12/2024 1259   MCV 102.3 (H) 04/17/2024 1631   MCV 99 (H) 01/12/2024 1259   MCH 31.8 04/17/2024 1631  MCHC 31.1 04/17/2024 1631   RDW 18.9 (H) 04/17/2024 1631   RDW 15.7 (H) 01/12/2024 1259   LYMPHSABS 0.7 04/12/2024 2137   LYMPHSABS 0.9 01/12/2024 1259   MONOABS 0.3 04/12/2024 2137   EOSABS 0.1 04/12/2024 2137   EOSABS 0.1 01/12/2024 1259   BASOSABS 0.1 04/12/2024 2137   BASOSABS 0.0 01/12/2024 1259     CMP     Component Value Date/Time   NA 128 (L) 04/17/2024 1631   NA 134 01/12/2024 1259   K 4.9 04/17/2024 1631   CL 89 (L) 04/17/2024 1631   CO2 23 04/17/2024 1631   GLUCOSE 185 (H) 04/17/2024 1631   BUN 57 (H) 04/17/2024 1631   BUN 31 (H) 01/12/2024 1259   CREATININE 5.09 (H) 04/17/2024 1631   CALCIUM  9.0 04/17/2024 1631   PROT 5.5 (L) 04/17/2024 1631   ALBUMIN  2.4 (L) 04/17/2024 1631   AST 28 04/17/2024 1631   ALT 21 04/17/2024 1631   ALKPHOS 117 04/17/2024 1631   BILITOT 1.0 04/17/2024 1631   GFRNONAA 8 (L) 04/17/2024 1631    Imaging:  CTA abdomen and pelvis: VASCULAR   Extensive atherosclerotic calcifications are noted.    Diminutive renal arteries with associated renal atrophy.   No evidence of active extravasation or delayed pooling to suggest active GI hemorrhage.   NON-VASCULAR   Diverticulosis without diverticulitis.   Large bilateral pleural effusions with associated atelectatic changes.   Cholelithiasis without complicating factors.   Mild free fluid in the pelvis with changes of anasarca.  Chest x-ray: 1. Cardiomegaly with central pulmonary vascular congestion. 2. Patchy opacities in lung bases and small right pleural effusion.   EKG: personally reviewed my interpretation is normal sinus rhythm.  Normal axis.  No ST segment changes when compared to previous EKG.  No obvious changes.   ASSESSMENT & PLAN:   Assessment & Plan by Problem: Principal Problem:   Symptomatic anemia Active Problems:   CHF with left ventricular diastolic dysfunction, NYHA class 1 ([name])   Hyponatremia   Type 2 diabetes mellitus with chronic kidney disease on chronic dialysis, with long-term current use of insulin  ([name])   ESRD on hemodialysis ([name])   Hypertension   Upper GI bleed   Anna Carr is a 83 y.o. female with past medical history of ESRD, HFpEF, type 2 diabetes, hypertension who presents for concerns of weakness and lightheadedness.  Patient found to be anemic likely secondary to a upper GI bleed, and admitted for further evaluation and management.  Symptomatic anemia, acute on chronic anemia of chronic disease Upper GI bleed Patient presents today with weakness and lightheadedness.  Hemoglobin 6.9.  Patient has had episodes of hematochezia.  On rectal exam, we do appreciate dark stool, but no obvious bright red blood.  Patient has had history of GI bleeds, with bleeding noted in the duodenum and jejunum, but due to complications with anesthesia including a cardiac arrest in Mar 02, 2024 scopes were not finished.  Again patient returns for concerns of bleeding.  Patient is symptomatic from the  standpoint.  Patient has received 1 unit of blood we will follow-up CBC.   -GI has been consulted, appreciate recommendations. - PPI 40 mg twice daily IV for 72 hours, followed by p.o. - Monitor CBC closely - Status post 1 unit packed red blood cells - Unsure if patient will be able to be scoped secondary to history of scopes leading to bradycardia - Patient has recently received ESA on 06/20 - Given history of folate  deficiency, start folate supplementation  HFpEF Patient does look volume overloaded on exam.  She does have ESRD.  Do not think Lasix  would help at this point.  Will dialyze per nephrology. - Dialyze per nephrology - Patient is not hypoxic and do not think patient has an exacerbation - Hold home Lasix  80 mg daily as she will get dialysis tomorrow - Hold home carvedilol  6.25 mg daily  ESRD on HD TTS Hyponatremia Past medical history of ESRD.  Patient does get dialysis.  She has not missed any dialysis.  Her next dialysis is due tomorrow. -Dialysis per nephrology  T2DM Past medical history of type 2 diabetes mellitus.  Home medication includes Soliqua  6 units every morning.  Given patient might need to be n.p.o., will hold off on long-acting.  Will do sliding scale insulin  for now.  A1c in Mar 15, 2024 was 5.8. - Hold Soliqua  6 units - Sliding scale insulin   Hypertension Blood pressure currently measuring 130/55.  Earlier was 98/73.  Patient likely has volume loss. At previous discharge, patient was stopped on her carvedilol  and Lasix  given that she had episodes of hypotension.  Will hold at this time as well. - Hold home carvedilol  6.25 mg daily - Hold home Lasix  80 mg daily  Wound Care Consult to wound care for scattered abrasions, pressure ulcers  Diet: Normal VTE: SCDs IVF: Blood Code: Full  Prior to Admission Living Arrangement: Home, living alone Anticipated Discharge Location: Home Barriers to Discharge: Clinical improvement, hemoglobin improvement, GI  consult  Dispo: Admit patient to Observation with expected length of stay less than 2 midnights.  Signed: Norman Lobstein, DO Internal Medicine Resident PGY-1 04/18/2024, 1:33 AM   On weekends or after 5pm please page on call intern or resident: First contact: [phone] If no answer in 15 minutes, please contact senior pager at [phone]

## 2024-04-17 NOTE — ED Notes (Signed)
 Admitting nephrologist advised RN to transfuse 1 unit of blood .

## 2024-04-17 NOTE — ED Provider Triage Note (Signed)
 Emergency Medicine Provider Triage Evaluation Note  Anna Carr , a 83 y.o. female  was evaluated in triage.  Pt complains of nausea and rectal bleeding along with generalized weakness.  Discharged from this facility on 20 June for community-acquired pneumonia.  Continue to take outpatient courses of antibiotics, returns to the ED today as he was referred here by home health for weakness, as well as bloody stools.  Concern for anemia given her hematochezia.  Last hemoglobin on 20 June was 9.8.  Review of Systems  Positive: Generalized weakness, nausea, malaise, hematochezia Negative: Vomiting  Physical Exam  BP (!) 125/42 (BP Location: Right Arm)   Pulse 81   Temp 97.7 F (36.5 C) (Oral)   Resp 18   SpO2 97%  Gen:   Awake, no distress   Resp:  Normal effort  MSK:   Moves extremities without difficulty  Other:  Ecchymosis noted to the right side of the face and to the forehead which is resolving, from prior fall.  Medical Decision Making  Medically screening exam initiated at 5:17 PM.  Appropriate orders placed.  Anna Carr was informed that the remainder of the evaluation will be completed by another provider, this initial triage assessment does not replace that evaluation, and the importance of remaining in the ED until their evaluation is complete.  Further workup for assessment of hemoglobin, metabolic process ordered.   Anna Carr, Anna Carr 04/17/24 [phone]

## 2024-04-18 DIAGNOSIS — K921 Melena: Principal | ICD-10-CM

## 2024-04-18 DIAGNOSIS — D62 Acute posthemorrhagic anemia: Secondary | ICD-10-CM | POA: Diagnosis not present

## 2024-04-18 DIAGNOSIS — K922 Gastrointestinal hemorrhage, unspecified: Secondary | ICD-10-CM | POA: Diagnosis not present

## 2024-04-18 DIAGNOSIS — K5520 Angiodysplasia of colon without hemorrhage: Secondary | ICD-10-CM

## 2024-04-18 DIAGNOSIS — I5031 Acute diastolic (congestive) heart failure: Secondary | ICD-10-CM | POA: Diagnosis not present

## 2024-04-18 DIAGNOSIS — Z992 Dependence on renal dialysis: Secondary | ICD-10-CM

## 2024-04-18 DIAGNOSIS — N186 End stage renal disease: Secondary | ICD-10-CM | POA: Diagnosis not present

## 2024-04-18 DIAGNOSIS — D649 Anemia, unspecified: Secondary | ICD-10-CM

## 2024-04-18 DIAGNOSIS — D631 Anemia in chronic kidney disease: Secondary | ICD-10-CM | POA: Diagnosis not present

## 2024-04-18 DIAGNOSIS — E871 Hypo-osmolality and hyponatremia: Secondary | ICD-10-CM

## 2024-04-18 LAB — CBC
HCT: 25.5 % — ABNORMAL LOW (ref 36.0–46.0)
Hemoglobin: 8.2 g/dL — ABNORMAL LOW (ref 12.0–15.0)
MCH: 31.5 pg (ref 26.0–34.0)
MCHC: 32.2 g/dL (ref 30.0–36.0)
MCV: 98.1 fL (ref 80.0–100.0)
Platelets: 227 10*3/uL (ref 150–400)
RBC: 2.6 MIL/uL — ABNORMAL LOW (ref 3.87–5.11)
RDW: 19.2 % — ABNORMAL HIGH (ref 11.5–15.5)
WBC: 7.3 10*3/uL (ref 4.0–10.5)
nRBC: 0.8 % — ABNORMAL HIGH (ref 0.0–0.2)

## 2024-04-18 LAB — GLUCOSE, CAPILLARY
Glucose-Capillary: 174 mg/dL — ABNORMAL HIGH (ref 70–99)
Glucose-Capillary: 115 mg/dL — ABNORMAL HIGH (ref 70–99)
Glucose-Capillary: 69 mg/dL — ABNORMAL LOW (ref 70–99)
Glucose-Capillary: 110 mg/dL — ABNORMAL HIGH (ref 70–99)
Glucose-Capillary: 256 mg/dL — ABNORMAL HIGH (ref 70–99)

## 2024-04-18 LAB — RENAL FUNCTION PANEL
Albumin: 2.4 g/dL — ABNORMAL LOW (ref 3.5–5.0)
Anion gap: 18 — ABNORMAL HIGH (ref 5–15)
BUN: 62 mg/dL — ABNORMAL HIGH (ref 8–23)
CO2: 20 mmol/L — ABNORMAL LOW (ref 22–32)
Calcium: 9 mg/dL (ref 8.9–10.3)
Chloride: 91 mmol/L — ABNORMAL LOW (ref 98–111)
Creatinine, Ser: 5.4 mg/dL — ABNORMAL HIGH (ref 0.44–1.00)
GFR, Estimated: 7 mL/min — ABNORMAL LOW (ref >60)
Glucose, Bld: 211 mg/dL — ABNORMAL HIGH (ref 70–99)
Phosphorus: 5.2 mg/dL — ABNORMAL HIGH (ref 2.5–4.6)
Potassium: 5.1 mmol/L (ref 3.5–5.1)
Sodium: 129 mmol/L — ABNORMAL LOW (ref 135–145)

## 2024-04-18 LAB — HEMOGLOBIN AND HEMATOCRIT, BLOOD
HCT: 27.6 % — ABNORMAL LOW (ref 36.0–46.0)
Hemoglobin: 8.9 g/dL — ABNORMAL LOW (ref 12.0–15.0)

## 2024-04-18 LAB — CBG MONITORING, ED: Glucose-Capillary: 127 mg/dL — ABNORMAL HIGH (ref 70–99)

## 2024-04-18 LAB — HEPATITIS B SURFACE ANTIGEN: Hepatitis B Surface Ag: NONREACTIVE

## 2024-04-18 MED ORDER — INSULIN ASPART 100 UNIT/ML IJ SOLN
0.0000 [IU] | Freq: Three times a day (TID) | INTRAMUSCULAR | Status: DC
  Administered 2024-04-18: 2 [IU] via SUBCUTANEOUS
  Administered 2024-04-19: 5 [IU] via SUBCUTANEOUS
  Administered 2024-04-19: 2 [IU] via SUBCUTANEOUS

## 2024-04-18 MED ORDER — HYDROMORPHONE HCL 2 MG PO TABS
1.0000 mg | ORAL_TABLET | Freq: Once | ORAL | Status: AC
  Administered 2024-04-18: 1 mg via ORAL
  Filled 2024-04-18: qty 1

## 2024-04-18 MED ORDER — FOLIC ACID 1 MG PO TABS
1.0000 mg | ORAL_TABLET | Freq: Every day | ORAL | Status: DC
  Administered 2024-04-19: 1 mg via ORAL
  Filled 2024-04-18 (×2): qty 1

## 2024-04-18 MED ORDER — ACETAMINOPHEN 500 MG PO TABS
500.0000 mg | ORAL_TABLET | Freq: Four times a day (QID) | ORAL | Status: DC | PRN
  Administered 2024-04-18: 500 mg via ORAL
  Filled 2024-04-18: qty 1

## 2024-04-18 MED ORDER — INSULIN ASPART 100 UNIT/ML IJ SOLN
0.0000 [IU] | Freq: Every day | INTRAMUSCULAR | Status: DC
  Administered 2024-04-18: 3 [IU] via SUBCUTANEOUS

## 2024-04-18 MED ORDER — MUPIROCIN 2 % EX OINT
TOPICAL_OINTMENT | Freq: Two times a day (BID) | CUTANEOUS | Status: DC
  Filled 2024-04-18 (×2): qty 22

## 2024-04-18 MED ORDER — GUAIFENESIN-DM 100-10 MG/5ML PO SYRP
10.0000 mL | ORAL_SOLUTION | Freq: Four times a day (QID) | ORAL | Status: DC | PRN
  Administered 2024-04-18 (×2): 10 mL via ORAL
  Filled 2024-04-18: qty 10

## 2024-04-18 NOTE — Progress Notes (Signed)
 Hypoglycemic Event  CBG: 69  Treatment: 8 oz juice/soda  Symptoms: None  Follow-up CBG: Time:1757 CBG Result:110  Possible Reasons for Event: Inadequate meal intake  Comments/MD notified: Patient was given a [place] sandwich tray prior to leaving to HD. Patient returned with the [place] sandwich tray intact. Patient notified RN that she didn't want to eat in HD because it was too cold. RN verbalize understanding. Patient was asymptomatic during this event. Rounding team made aware.     Anna Carr

## 2024-04-18 NOTE — Progress Notes (Deleted)
   04/18/24 1725  Charting Type  Charting Type Full Reassessment No Change  Focused Reassessment No Changes Neurological;Respiratory;Cardiac  [place] Work Intensity Score (Update with each assessment and as needed)  Work Intensity Score (Level) 3  Level 3 Intensity B.Frequent assistance requests (patient or family)  Neurological  Neuro (WDL) WDL  Orientation Level Oriented X4  Cognition Appropriate at baseline;Appropriate attention/concentration  Respiratory  Respiratory Pattern Regular;Unlabored  Chest Assessment Chest expansion symmetrical  Respiratory (WDL) X  Cardiac  Cardiac (WDL) X  Pulse Irregular (post HD sessions)  Musculoskeletal  Assistive Device Front wheel walker  Neurological  Level of Consciousness Alert    CCMD notified RN that patient is in A Fib during HD. RN notified MD and EKG ordered. EKG obtained and A Fib noted on tele strip on unit monitor. Patient says 0/10 chest pain and is asymptomatic. MD team to round to review EKG.

## 2024-04-18 NOTE — Progress Notes (Signed)
 Subjective:  Patient had another bloody bowel movement overnight. She feels stronger after receiving the blood transfusion and currently denies lightheadedness, weakness, or dizziness. Her legs continue to feel very swollen, which sometimes happens even though she has not missed a dialysis session. She is also hungry and ready to eat.  Objective:  Vital signs in last 24 hours: Vitals:   04/18/24 0115 04/18/24 0218 04/18/24 0250 04/18/24 0303  BP: (!) 130/55   133/80  Pulse: 95   98  Resp: (!) 22   18  Temp:  97.6 F (36.4 C)  97.7 F (36.5 C)  TempSrc:  Oral    SpO2: 97%   99%  Height:   5' 1 (1.549 m)     Intake/Output Summary (Last 24 hours) at 04/18/2024 0746 Last data filed at 04/18/2024 0300 Gross per 24 hour  Intake 555 ml  Output 0 ml  Net 555 ml   General: Older-appearing female lying comfortably in hospital bed with son and daughter at bedside, in NAD Cardiovascular: Normal rate with regular rhythm. No murmurs, rubs, or gallops. 2+ pitting symmetric LE edema. AV fistula on left UE with palpable thrill and bruit on auscultation. Pulmonary: Lungs CTAB. No wheezes, rales, or rhonchi. Normal respiratory effort on room air. Abdominal: Soft. Non-distended. No tenderness to palpation. Normal bowel sounds.   Neurological: Alert, responds appropriately to questions. Skin: Warm and dry. Multiple scattered wounds and bruises from recent falls.  Assessment/Plan:  Principal Problem:   Symptomatic anemia Active Problems:   CHF with left ventricular diastolic dysfunction, NYHA class 1 ([name])   Hyponatremia   Type 2 diabetes mellitus with chronic kidney disease on chronic dialysis, with long-term current use of insulin  ([name])   ESRD on hemodialysis ([name])   Hypertension   Upper GI bleed  Anna Carr is a 83 year-old female with PMH of ESRD on HD TTS , HFpEF, T2DM, HTN who presented for weakness and lightheadedness in the setting of hematochezia and admitted on 04/17/2024  for symptomatic anemia likely secondary to an upper GI bleed.   #Symptomatic anemia, acute on chronic anemia of chronic disease #Upper GI bleed Patient had another bloody bowel movement overnight, though she is feeling better s/p 1 unit pRBCs. Post-transfusion hemoglobin improved from 6.9 to 8.2. Given her history of bradycardic arrest after EGD on 03/02/2024, GI recommends supportive management now and for any future GI bleeds. 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 Will decrease IV pantoprazole  40 mg from BID to once daily per GI. Will repeat CBC this evening and tomorrow morning with plans to transfuse if Hgb <7.  - GI consulted; appreciate recs - IV pantoprazole  40 mg once daily -> transition to P.O. on 6/27 - Repeat CBC @ 1700 tonight and 0500 tomorrow. - Transfuse for Hgb <7 - Continue folate 1 mg daily   #HFpEF Patient appears volume overloaded on exam with 2+ pitting LE edema, though no crackles on lung auscultation. Currently holding home Lasix  and carvedilol  due to hypotension, but can consider restarting Lasix  after dialysis to help with diuresis. - Dialyze per nephrology - Hold home Lasix  80 mg daily - Hold home carvedilol  6.25 mg daily   #ESRD on HD TTS #Hyponatremia Patient is on TTS hemodialysis outpatient for ESRD. Next dialysis session is due today (6/24). She denies missing any sessions recently. Suspect hyponatremia is secondary to hypervolemic state, so will recheck BMP tomorrow morning. Appreciate nephrology's assistance while hospitalized.  -  Nephrology consulted; appreciate recs - Hemodialysis TTS - Patient has recently received ESA on 06/20 - Repeat BMP tomorrow AM   #T2DM Most recent HgbA1c 5.8% in 02/2024. Glucose stable between 115-211 since admission. Will hold home Soliqua  6 units every morning while patient is NPO, though can consider restarting once diet is resumed if blood glucose becomes elevated. Will continue  sliding scale insulin .  - Hold home Soliqua  6 units every morning - Sliding scale insulin  - CBGs AC/HS   #Hypertension Blood pressure ranging between 82/70-136/62 overnight. Will continue holding home Lasix  and carvedilol  for now given intermittent hypotension. Can consider restarting Lasix  after hemodialysis to help with LE edema if blood pressure can tolerate. - Hold home carvedilol  6.25 mg daily - Hold home Lasix  80 mg daily   #Wound Care Patient has multiple abrasions and bruises from recent falls on exam. She also has a pressure ulcers on her heels (R>L). Wound care has been consulted to help with management. - Wound care consulted; appreciate recs   LOS: 0 days   Anna Carr, Medical Student 04/18/2024, 7:46 AM

## 2024-04-18 NOTE — Progress Notes (Signed)
 New Admission Note:  Arrival Method: Stretcher from ED Mental Orientation: A&Ox4 Telemetry: Box # 16 Assessment: Initiated Skin: Healing skin tear to R elbow and R knee with pink foam, rash on BLE and feet (red and peeling) IV: PIV R AC Pain: 7/10 Tubes: None Safety Measures: Safety Fall Prevention Plan was discussed  Admission: Initiated Belongings: Clothing in bag at bedside Unit Orientation: Patient has been oriented to the room, unit, and the staff.  Orders have been reviewed and implemented. Call light has been placed within reach and bed alarm has been activated.   Levon Chancy, RN

## 2024-04-18 NOTE — Progress Notes (Signed)
 [facility] Progress Note   Subjective: Seen in room. Daughter at bedside. GI consulted for ABLA. Received 1 unit prbcs overnight. Feels weak. Breathing ok. For dialysis today.   Objective Vitals:   04/18/24 0218 04/18/24 0250 04/18/24 0303 04/18/24 0755  BP:   133/80 115/68  Pulse:   98 88  Resp:   18 18  Temp: 97.6 F (36.4 C)  97.7 F (36.5 C) 97.7 F (36.5 C)  TempSrc: Oral   Oral  SpO2:   99% 96%  Height:  5' 1 (1.549 m)       Additional Objective Labs: Basic Metabolic Panel: Recent Labs  Lab 04/12/24 2137 04/13/24 1258 04/14/24 1058 04/17/24 1631 04/18/24 0518  NA 125* 130* 131* 128* 129*  K 5.2* 4.3 4.0 4.9 5.1  CL 87* 90* 90* 89* 91*  CO2 19* 23 24 23  20*  GLUCOSE 191* 236* 224* 185* 211*  BUN 85* 44* 36* 57* 62*  CREATININE 6.79* 4.44* 3.95* 5.09* 5.40*  CALCIUM  9.2 8.6* 8.8* 9.0 9.0  PHOS 5.4* 3.7  --   --  5.2*   CBC: Recent Labs  Lab 04/12/24 2137 04/13/24 0941 04/13/24 1258 04/14/24 0354 04/17/24 1631 04/18/24 0518  WBC 7.6  --  6.2 5.4 7.4 7.3  NEUTROABS 6.4  --   --   --   --   --   HGB 11.5*   < > 9.8* 9.8* 6.9* 8.2*  HCT 35.4*   < > 30.9* 31.1* 22.2* 25.5*  MCV 96.2  --  97.2 100.6* 102.3* 98.1  PLT 156  --  163 139* 234 227   < > = values in this interval not displayed.   Blood Culture    Component Value Date/Time   SDES PLEURAL 04/11/2024 1101   SPECREQUEST NONE 04/11/2024 1101   CULT  04/11/2024 1101    NO GROWTH 3 DAYS Performed at [facility], [address] [place], [place] [address]    REPTSTATUS 04/14/2024 FINAL 04/11/2024 1101     Physical Exam General: Lying in bed, nad, room air  Heart: RRR  Lungs: Clear  Abdomen: soft non-tender Extremities: 1+ LE edema, has pitting hip edema  Dialysis Access: LUE AVF +bruit   Medications:   sodium chloride    Intravenous Once   Chlorhexidine  Gluconate Cloth  6 each Topical Q0600   folic acid   1 mg Oral Daily   insulin  aspart  0-5 Units  Subcutaneous QHS   insulin  aspart  0-9 Units Subcutaneous TID WC   mupirocin  ointment   Topical BID   pantoprazole  (PROTONIX ) IV  40 mg Intravenous Q12H    Dialysis Orders:  TTS  [facility] EDW 71 kg 3.5 hours 2K/2.5 Ca BF 400  DF auto 1.5 Last HD on 6/21 with post weight of 72.7 kg Last ESA on 04/14/24 - 40 mcg aranesp  and was to begin mircera 150 mcg every 2 weeks outpatient but hadn't gotten a dose Other meds: calcitriol  0.25 mcg three times a week   Assessment/Plan: Recurrent GIB/ABLA. Hx of AVMs. S/p 2 units prbcs. GI consulted. Of note had intra procedure cardiac arrest during EGD in May.  ESRD - HD TTS. Recent declot of AVG by IR 6/18. HD today.  HTN. BP acceptable.  Volume. Optimize volume with HD as able.  Anemia. As above. Last ESA on 6/20.  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    Anna Ronnald Acosta PA-C [facility] 04/18/2024,11:47 AM

## 2024-04-18 NOTE — Consult Note (Addendum)
 Referring Provider:  EDP Primary Care Physician:  Thurmond Cathlyn LABOR., MD Primary Gastroenterologist:  Dr. Legrand  Reason for Consultation:  GI bleed  HPI: Anna Carr is a 83 y.o. 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  She presents to the emergency department after having 2-3 episodes of bloody bowel movements.  She states she left the hospital on 04/14/2024 and shortly after began feeling weak and light-headed.  She started having bloody bowel movements yesterday morning, 6/23.  Presented with a hemoglobin of 6.9 g yesterday as compared to 9.8 g 3 days prior.  CT angio negative for GI bleeding source.  Received one unit of PRBCs with increase in Hgb to 8.2 grams.  The patient's daughter provides most of the history.  Says that she has started feeling weak and yesterday morning she had a couple bowel movements that had some maroon-colored blood in the stool.  Apparently one episode in the ED, but unaware of any further bleeding since then.  Was seen by our GI service on 6/16 for anemia and heme positive stools.  Deferred any other GI procedures.  Patient had very recent capsule endoscopy on 02/29/2024 with finding of more active oozing of heme appeared to be coming from the mid small bowel, there was also a streak of heme noted just a couple of minutes beyond the first duodenal image.   Subsequent EGD was done on 03/02/2024 showing a normal esophagus, there were 2 nonbleeding AVMs in the duodenum treated with APC the examined portion of the jejunum was normal. Just after the conclusion of the procedure the patient developed bradycardia then asystole and required CPR with rapid ROSC and did not require shock.  Patient was admitted thereafter to the intensive care unit. She also had a procedure earlier in May with EGD and  also had bradycardia during that procedure.  Decision was made by GI not to pursue any further endoscopic evaluation for this patient noting forward.  Colonoscopy 02/2021:  - Preparation of the colon was fair. - Two diminutive polyps in the transverse colon and in the cecum, removed with a cold snare. Resected and retrieved. - Diverticulosis in the left colon. - Redundant colon. - The examination was otherwise normal on direct and retroflexion views.   CT angio of the abdomen and pelvis on 6/23:  IMPRESSION: VASCULAR   Extensive atherosclerotic calcifications are noted.   Diminutive renal arteries with associated renal atrophy.   No evidence of active extravasation or delayed pooling to suggest active GI hemorrhage.   NON-VASCULAR   Diverticulosis without diverticulitis.   Large bilateral pleural effusions with associated atelectatic changes.   Cholelithiasis without complicating factors.   Mild free fluid in the pelvis with changes of anasarca.  Patient's daughter's main concern is having her Hgb checked closely and being able to have access to having that done when she is feeling weak, etc.    Past Medical History:  Diagnosis Date   Acute on chronic anemia 02/25/2024   Acute pain of left shoulder 03/05/2024   Acute upper GI bleed 03/03/2024   Anemia due to chronic blood loss    Anemia in chronic kidney disease 01/23/2017   Anemia, chronic disease 01/23/2017   Angiodysplasia of small intestine 02/25/2024   Aortic stenosis moderate 2024 06/21/2023   Arteriovenous fistula, acquired ([name]) 11/02/2019   AV fistula ([name])  AVM (arteriovenous malformation)    AVM (arteriovenous malformation) of duodenum, acquired 03/03/2024   AVM (arteriovenous malformation) of duodenum, acquired with hemorrhage 10/30/2021   AVM (arteriovenous malformation) of small bowel, acquired with hemorrhage    C. difficile colitis 03/30/2021   Cardiac arrest ([name]) 03/02/2024   Cellulitis  03/16/2021   CHF with left ventricular diastolic dysfunction, NYHA class 1 ([name]) 02/02/2017   Cholelithiasis 03/30/2021   Chronic constipation 12/22/2015   Chronic laryngitis 02/17/2017   Last Assessment & Plan:   Formatting of this note might be different from the original.  Evaluation of larynx.  Was hospitalized for pneumonia and during that time she started noticing worsening hoarseness.  Denies any throat pain.  No obvious heartburn.  She did recently start PPI therapy.  She smoked in the past.  Denies any difficulty swallowing.  Hoarseness is improving.  EXAM shows mildly ras   Coagulation defect, unspecified ([name]) 12/07/2019   Colon polyp    Complication of vascular dialysis catheter 03/05/2020   Contact with and (suspected) exposure to covid-19 10/28/2020   COPD mixed type ([name])    Cough, unspecified 10/28/2020   COVID-19 10/29/2020   Depression    Diverticulosis of large intestine without perforation or abscess without bleeding 01/08/2020   Dyspnea on exertion    Dyspnea, unspecified 11/02/2019   Elevated troponin 03/16/2021   Encounter for removal of sutures 12/28/2019   Encounter for screening for respiratory tuberculosis 12/06/2019   ESRD (end stage renal disease) ([name]) 07/18/2019   ESRD on dialysis ([name]) 09/18/2019   ESRD on hemodialysis ([name]) 01/02/2020   Fever, unspecified 01/23/2020   GI bleed 12/19/2021   GIB (gastrointestinal bleeding) 01/02/2020   Heme positive stool    High risk medication use 12/22/2015   History of blood transfusion    History of colonic polyps 01/08/2020   HLD (hyperlipidemia) 12/22/2015   Hypertension    Hypervolemia 11/13/2019   Hypokalemia 03/11/2019   Hyponatremia 03/11/2019   Hypovolemia 10/27/2021   Hypoxia 03/11/2019   Insomnia    Insulin  long-term use ([name]) 12/22/2015   Iron  deficiency anemia, unspecified 12/06/2019   Major depressive disorder, recurrent ([name]) 12/22/2015   Malaise and fatigue 12/23/2015   Mild protein-calorie  malnutrition ([name]) 03/26/2020   Mitral regurgitation moderate to severe based on echocardiogram from 2022 11/03/2021   Musculoskeletal chest pain 03/03/2024   Osteoarthritis    Osteopenia    Other disorders of phosphorus metabolism 01/08/2020   PAC (premature atrial contraction) 10/04/2020   Pain, unspecified 12/06/2019   PAT (paroxysmal atrial tachycardia) ([name]) 10/04/2020   Peripheral vascular disease, unspecified ([name]) critical stenosis lower extremity 12/01/2023   Peripheral arterial disease     Pneumonia    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  Chodri CT annually.     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 dialysis, with long-term current use of insulin  ([name]) 12/23/2015   Type 2 diabetes mellitus, with long-term current use of insulin  ([name]) 03/11/2019   Unspecified hemorrhoids 01/08/2020    Past Surgical History:  Procedure Laterality Date   A/V FISTULAGRAM Left 02/17/2021   Procedure: A/V FISTULAGRAM;  Surgeon: Sheree Penne Bruckner, MD;  Location: [facility] INVASIVE CV LAB;  Service: Cardiovascular;  Laterality: Left;  APPENDECTOMY     AV FISTULA PLACEMENT Left    AV FISTULA PLACEMENT Left 03/12/2021   Procedure: INSERTION OF LEFT UPPER ARM ARTERIOVENOUS (AV) GORE-TEX GRAFT;  Surgeon: Sheree Penne Bruckner, MD;  Location: [facility] OR;  Service: Vascular;  Laterality: Left;   BALLOON ENTEROSCOPY  03/02/2024   Procedure: ENTEROSCOPY, USING BALLOON;  Surgeon: San Sandor GAILS, DO;  Location: [facility] ENDOSCOPY;  Service: Gastroenterology;;   COLONOSCOPY WITH PROPOFOL  N/A 03/18/2021   Procedure: COLONOSCOPY WITH PROPOFOL ;  Surgeon: Legrand Victory LITTIE DOUGLAS, MD;   Location: [facility] ENDOSCOPY;  Service: Gastroenterology;  Laterality: N/A;   CYSTECTOMY     ENTEROSCOPY N/A 03/21/2021   Procedure: ENTEROSCOPY;  Surgeon: Legrand Victory LITTIE DOUGLAS, MD;  Location: [facility] ENDOSCOPY;  Service: Gastroenterology;  Laterality: N/A;   ENTEROSCOPY N/A 12/20/2021   Procedure: ENTEROSCOPY;  Surgeon: Legrand Victory LITTIE DOUGLAS, MD;  Location: [facility] ENDOSCOPY;  Service: Gastroenterology;  Laterality: N/A;   ENTEROSCOPY N/A 02/26/2024   Procedure: ENTEROSCOPY;  Surgeon: Albertus Gordy HERO, MD;  Location: [facility] ENDOSCOPY;  Service: Gastroenterology;  Laterality: N/A;   ENTEROSCOPY N/A 02/28/2024   Procedure: ENTEROSCOPY;  Surgeon: San Sandor GAILS, DO;  Location: [facility] ENDOSCOPY;  Service: Gastroenterology;  Laterality: N/A;   ESOPHAGOGASTRODUODENOSCOPY (EGD) WITH PROPOFOL  N/A 03/18/2021   Procedure: ESOPHAGOGASTRODUODENOSCOPY (EGD) WITH PROPOFOL ;  Surgeon: Legrand Victory LITTIE DOUGLAS, MD;  Location: [facility] ENDOSCOPY;  Service: Gastroenterology;  Laterality: N/A;   GIVENS CAPSULE STUDY N/A 03/19/2021   Procedure: GIVENS CAPSULE STUDY;  Surgeon: Legrand Victory LITTIE DOUGLAS, MD;  Location: [name] ENDOSCOPY;  Service: Gastroenterology;  Laterality: N/A;   GIVENS CAPSULE STUDY N/A 02/29/2024   Procedure: IMAGING PROCEDURE, GI TRACT, INTRALUMINAL, VIA CAPSULE;  Surgeon: San Sandor GAILS, DO;  Location: [facility] ENDOSCOPY;  Service: Gastroenterology;  Laterality: N/A;   HEMOSTASIS CLIP PLACEMENT  12/20/2021   Procedure: HEMOSTASIS CLIP PLACEMENT;  Surgeon: Legrand Victory LITTIE DOUGLAS, MD;  Location: [facility] ENDOSCOPY;  Service: Gastroenterology;;   HOT HEMOSTASIS N/A 03/21/2021   Procedure: HOT HEMOSTASIS (ARGON PLASMA COAGULATION/BICAP);  Surgeon: Legrand Victory LITTIE DOUGLAS, MD;  Location: [facility] ENDOSCOPY;  Service: Gastroenterology;  Laterality: N/A;   HOT HEMOSTASIS N/A 12/20/2021   Procedure: HOT HEMOSTASIS (ARGON PLASMA COAGULATION/BICAP);  Surgeon: Legrand Victory LITTIE DOUGLAS, MD;  Location: [facility] ENDOSCOPY;  Service: Gastroenterology;  Laterality: N/A;   HOT HEMOSTASIS N/A 03/02/2024    Procedure: EGD, WITH ARGON PLASMA COAGULATION;  Surgeon: San Sandor GAILS, DO;  Location: [facility] ENDOSCOPY;  Service: Gastroenterology;  Laterality: N/A;   HYSTEROSCOPY     IR THROMBECTOMY AV FISTULA W/THROMBOLYSIS/PTA INC/SHUNT/IMG LEFT Left 04/12/2024   IR US  GUIDE VASC ACCESS LEFT  04/12/2024   LOWER EXTREMITY ANGIOGRAPHY N/A 02/03/2024   Procedure: Lower Extremity Angiography;  Surgeon: Court Dorn PARAS, MD;  Location: [facility] INVASIVE CV LAB;  Service: Cardiovascular;  Laterality: N/A;   POLYPECTOMY  03/18/2021   Procedure: POLYPECTOMY;  Surgeon: Legrand Victory LITTIE DOUGLAS, MD;  Location: [facility] ENDOSCOPY;  Service: Gastroenterology;;    Prior to Admission medications   Medication Sig Start Date End Date Taking? Authorizing Provider  acetaminophen  (TYLENOL ) 500 MG tablet Take 500 mg by mouth every 6 (six) hours as needed for mild pain (pain score 1-3) or moderate pain (pain score 4-6).    [provider]  ascorbic acid (VITAMIN C) 1000 MG tablet Take 1,000 mg by mouth at bedtime. 12/07/19   [provider]  aspirin  EC 81 MG tablet Take 1 tablet (81 mg total) by mouth daily. Swallow whole. 12/01/23   Krasowski, Robert J,  MD  Biotin 5000 MCG TABS Take 5,000 mcg by mouth daily in the afternoon.    [provider]  carboxymethylcellulose (REFRESH PLUS) 0.5 % SOLN Place 1 drop into both eyes 3 (three) times daily as needed (dry eyes).    [provider]  carvedilol  (COREG ) 6.25 MG tablet Take 6.25 mg by mouth daily. 12/07/19   [provider]  cyanocobalamin (VITAMIN B12) 1000 MCG tablet Take 1,000 mcg by mouth every evening.    [provider]  doxycycline  (VIBRA -TABS) 100 MG tablet Take 1 tablet (100 mg total) by mouth every 12 (twelve) hours. 04/14/24   Amoako, Prince, MD  ferric citrate  (AURYXIA ) 1 GM 210 MG(Fe) tablet Take 210 mg by mouth 3 (three) times daily with meals.    [provider]  fluticasone  (FLONASE ) 50 MCG/ACT nasal spray Place 1 spray  into both nostrils daily as needed (Congestion).    [provider]  furosemide  (LASIX ) 80 MG tablet Take 80 mg by mouth daily in the afternoon. 04/03/21   [provider]  hydrocortisone  1 % ointment Apply 1 Application topically 2 (two) times daily. 04/14/24   Amoako, Prince, MD  ibuprofen (ADVIL) 200 MG tablet Take 200-400 mg by mouth every 6 (six) hours as needed for mild pain (pain score 1-3).    [provider]  Insulin  Glargine-Lixisenatide  (SOLIQUA ) 100-33 UNT-MCG/ML SOPN Inject 6 Units into the skin in the morning. 03/11/24   Zheng, Michael, DO  Lactobacillus (PROBIOTIC ACIDOPHILUS PO) Take 1 capsule by mouth in the morning.    [provider]  lidocaine  (LIDODERM ) 5 % Place 1 patch onto the skin daily. Remove & Discard patch within 12 hours or as directed by MD Patient not taking: Reported on 04/10/2024 03/11/24   Zheng, Michael, DO  lidocaine -prilocaine  (EMLA ) cream Apply 1 application topically as needed (prior to fisula access). Tuesday, Thursday, Saturday for dialysis 07/04/20   [provider]  meloxicam (MOBIC) 7.5 MG tablet Take 7.5-15 mg by mouth daily as needed for pain. 04/05/24   [provider]  Menthol -Methyl Salicylate  (MUSCLE RUB) 10-15 % CREA Apply 1 Application topically 2 (two) times daily. Apply to left shoulder. Patient taking differently: Apply 1 Application topically 2 (two) times daily as needed for muscle pain. Apply to left shoulder. 03/11/24   Elicia Sharper, DO  Omega-3 Fatty Acids (FISH OIL PO) Take 1 capsule by mouth in the morning and at bedtime.    [provider]  pantoprazole  (PROTONIX ) 40 MG tablet Take 1 tablet (40 mg total) by mouth 2 (two) times daily. 03/11/24   Zheng, Michael, DO  polyethylene glycol (MIRALAX  / GLYCOLAX ) 17 g packet Take 17 g by mouth 2 (two) times daily as needed for moderate constipation.    [provider]  senna (SENOKOT) 8.6 MG TABS tablet Take 1 tablet (8.6 mg total) by  mouth daily as needed for mild constipation. 04/14/24   Amoako, Prince, MD  Vitamin E 670 MG (1000 UT) CAPS Take 1,000 Units by mouth in the morning. 01/09/20   [provider]    Current Facility-Administered Medications  Medication Dose Route Frequency Provider Last Rate Last Admin   0.9 %  sodium chloride  infusion (Manually program via Guardrails IV Fluids)   Intravenous Once Melvenia Motto, MD       acetaminophen  (TYLENOL ) tablet 500 mg  500 mg Oral Q6H PRN Marylu Gee, DO   500 mg at 04/18/24 0313   Chlorhexidine  Gluconate Cloth 2 % PADS 6 each  6 each Topical Q0600 Jerrye Katheryn BROCKS, MD       folic acid  (FOLVITE ) tablet 1 mg  1 mg Oral Daily Tobie Gaines, DO       guaiFENesin -dextromethorphan  (ROBITUSSIN DM) 100-10 MG/5ML syrup 10 mL  10 mL Oral Q6H PRN Machen, Julie, MD   10 mL at 04/18/24 9687   insulin  aspart (novoLOG ) injection 0-5 Units  0-5 Units Subcutaneous QHS Patel, Amar, DO       insulin  aspart (novoLOG ) injection 0-9 Units  0-9 Units Subcutaneous TID WC Patel, Amar, DO   2 Units at 04/18/24 9185   mupirocin  ointment (BACTROBAN ) 2 %   Topical BID Machen, Julie, MD   Given at 04/18/24 9180   pantoprazole  (PROTONIX ) injection 40 mg  40 mg Intravenous Q12H Tobie Gaines, DO   40 mg at 04/18/24 9185   senna-docusate (Senokot-S) tablet 1 tablet  1 tablet Oral QHS PRN Marylu Gee, DO        Allergies as of 04/17/2024 - Review Complete 04/17/2024  Allergen Reaction Noted   Atorvastatin  Other (See Comments) 04/10/2024    Family History  Problem Relation Age of Onset   Diabetes Mother    Hypertension Mother    Diabetes Maternal Grandfather    Liver disease Other     Social History   Socioeconomic History   Marital status: Widowed    Spouse name: Not on file   Number of children: 3   Years of education: Not on file   Highest education level: Not on file  Occupational History   Not on file  Tobacco Use   Smoking status: Former    Types: Cigarettes   Smokeless  tobacco: Never   Tobacco comments:    quit in 1973  Vaping Use   Vaping status: Never Used  Substance and Sexual Activity   Alcohol  use: Not Currently   Drug use: Never   Sexual activity: Not Currently  Other Topics Concern   Not on file  Social History Narrative   Not on file   Social Drivers of Health   Financial Resource Strain: Low Risk  (10/27/2021)   Received [facility]   Overall Financial Resource Strain (CARDIA)    Difficulty of Paying Living Expenses: Not hard at all  Food Insecurity: No Food Insecurity (04/10/2024)   Hunger Vital Sign    Worried About Running Out of Food in the Last Year: Never true    Ran Out of Food in the Last Year: Never true  Transportation Needs: No Transportation Needs (04/10/2024)   PRAPARE - Administrator, Civil Service (Medical): No    Lack of Transportation (Non-Medical): No  Physical Activity: Not on file  Stress: No Stress Concern Present (10/27/2021)   Received from [facility] - Occupational Stress Questionnaire    Feeling of Stress : Not at all  Social Connections: Socially Isolated (04/10/2024)   Social Connection and Isolation Panel    Frequency of Communication with Friends and Family: Twice a week    Frequency of Social Gatherings with Friends and Family: Three times a week    Attends Religious Services: Never    Active Member of Clubs or Organizations: No    Attends Banker Meetings: Never    Marital Status: Widowed  Intimate Partner Violence: Not At Risk (04/10/2024)   Humiliation, Afraid, Rape, and Kick questionnaire    Fear of Current or Ex-Partner: No    Emotionally Abused:  No    Physically Abused: No    Sexually Abused: No    Review of Systems: ROS is O/W negative except as mentioned in HPI.  Physical Exam: Vital signs in last 24 hours: Temp:  [97.6 F (36.4 C)-98.6 F (37 C)] 97.7 F (36.5 C) (06/24 0755) Pulse Rate:  [80-99] 88 (06/24  0755) Resp:  [16-25] 18 (06/24 0755) BP: (98-149)/(42-80) 115/68 (06/24 0755) SpO2:  [92 %-100 %] 96 % (06/24 0755)   General:  Alert, chronically ill-appearing, pleasant and cooperative in NAD Head:  Normocephalic and atraumatic.  Bruising noted on her face. Eyes:  Sclera clear, no icterus.  Conjunctiva pink. Ears:  Normal auditory acuity. Mouth:  No deformity or lesions. Lungs:  Clear throughout to auscultation.  No wheezes, crackles, or rhonchi.  Heart:  Regular rate and rhythm; no murmurs, clicks, rubs, or gallops. Abdomen:  Soft, non-distended.  BS present.  Non-tender.   Msk:  Symmetrical without gross deformities. Pulses:  Normal pulses noted. Extremities:  Pitting edema in B/L LEs with bandage on right leg. Neurologic:  Alert and oriented x 4;  grossly normal neurologically. Skin:  Intact without significant lesions or rashes. Psych:  Alert and cooperative. Normal mood and affect.  Intake/Output from previous day: 06/23 0701 - 06/24 0700 In: 555 [P.O.:240; Blood:315] Out: 0   Lab Results: Recent Labs    04/17/24 1631 04/18/24 0518  WBC 7.4 7.3  HGB 6.9* 8.2*  HCT 22.2* 25.5*  PLT 234 227   BMET Recent Labs    04/17/24 1631 04/18/24 0518  NA 128* 129*  K 4.9 5.1  CL 89* 91*  CO2 23 20*  GLUCOSE 185* 211*  BUN 57* 62*  CREATININE 5.09* 5.40*  CALCIUM  9.0 9.0   LFT Recent Labs    04/17/24 1631 04/18/24 0518  PROT 5.5*  --   ALBUMIN  2.4* 2.4*  AST 28  --   ALT 21  --   ALKPHOS 117  --   BILITOT 1.0  --    Studies/Results: DG CHEST PORT 1 VIEW Result Date: 04/18/2024 CLINICAL DATA:  Cough and weakness EXAM: PORTABLE CHEST 1 VIEW COMPARISON:  chest x-ray 04/12/2024 FINDINGS: Heart is enlarged. There is central pulmonary vascular congestion. There are patchy opacities in lung bases and small right pleural effusion. No acute fractures are seen. IMPRESSION: 1. Cardiomegaly with central pulmonary vascular congestion. 2. Patchy opacities in lung bases and  small right pleural effusion. Electronically Signed   By: Greig Pique M.D.   On: 04/18/2024 00:08   CT Angio Abd/Pel W and/or Wo Contrast Result Date: 04/17/2024 CLINICAL DATA:  Lower GI bleeding EXAM: CTA ABDOMEN AND PELVIS WITHOUT AND WITH CONTRAST TECHNIQUE: Multidetector CT imaging of the abdomen and pelvis was performed using the standard protocol during bolus administration of intravenous contrast. Multiplanar reconstructed images and MIPs were obtained and reviewed to evaluate the vascular anatomy. RADIATION DOSE REDUCTION: This exam was performed according to the departmental dose-optimization program which includes automated exposure control, adjustment of the mA and/or kV according to patient size and/or use of iterative reconstruction technique. CONTRAST:  75mL OMNIPAQUE  IOHEXOL  350 MG/ML SOLN COMPARISON:  None Available. FINDINGS: VASCULAR Aorta: Abdominal aorta demonstrates diffuse atherosclerotic calcifications. No aneurysmal dilatation or dissection is noted. Celiac: Heavy calcification is noted in the origin with only mild stenosis. SMA: Heavy calcification is noted in the origin with only mild stenosis. Renals: Renal arteries demonstrate atherosclerotic calcification. The right renal arteries are diminutive. High-grade stenosis in the left renal artery  is noted as well. IMA: Patent without evidence of aneurysm, dissection, vasculitis or significant stenosis. Inflow: Atherosclerotic calcifications are noted with mild areas of stenoses throughout system. The common femoral artery on right is heavily calcified with significant stenosis noted. Stenosis at the origin of the right SFA is seen as well. Veins: Venous structures show no specific abnormality. Review of the MIP images confirms the above findings. NON-VASCULAR Lower chest: Bilateral pleural effusions are noted right considerably larger than left. Mild basilar atelectasis is noted bilaterally. Hepatobiliary: Gallbladder is well distended  with multiple gallstones. No definitive complicating factors are seen. Heterogeneity of the liver is noted without focal mass. This may be related to the timing of the contrast bolus. Pancreas: Unremarkable. No pancreatic ductal dilatation or surrounding inflammatory changes. Spleen: Normal in size without focal abnormality. Adrenals/Urinary Tract: Adrenal glands are within normal limits. Kidneys are atrophic consistent with renal arterial disease. Bladder is decompressed. Stomach/Bowel: Scattered diverticular change of the colon is noted without evidence of diverticulitis. The appendix has been surgically removed. Small bowel and stomach appear within normal limits. No active extravasation or pooling of contrast on delayed images is identified to suggest acute GI hemorrhage. Lymphatic: No significant lymphadenopathy is noted. Reproductive: Uterus and bilateral adnexa are unremarkable. Other: Mild free fluid in the pelvis is noted of uncertain significance. No herniation is seen. Mild changes of anasarca are noted. Musculoskeletal: Bony structures are within normal limits. IMPRESSION: VASCULAR Extensive atherosclerotic calcifications are noted. Diminutive renal arteries with associated renal atrophy. No evidence of active extravasation or delayed pooling to suggest active GI hemorrhage. NON-VASCULAR Diverticulosis without diverticulitis. Large bilateral pleural effusions with associated atelectatic changes. Cholelithiasis without complicating factors. Mild free fluid in the pelvis with changes of anasarca. Electronically Signed   By: Oneil Devonshire M.D.   On: 04/17/2024 21:24   IMPRESSION:  #1  Anemia/GI bleed: Presented with a hemoglobin of 6.9 g yesterday as compared to 9.8 g 3 days prior.  Received one unit of PRBCs with increase in Hgb to 8.2 grams.  Now with episodes of bleeding.  CT angio negative yesterday, 6/23.  History of recurrent anemia and low-grade GI bleeding secondary to previously documented small  bowel AVMs. She had APC of duodenal AVMs in May 2025 and also was documented on capsule endoscopy to have evidence of bleeding in her mid small bowel/low-grade again likely secondary to AVMs but beyond the reach of enteroscopy   Patient had bradycardic arrest immediately after last endoscopic procedure required CPR, and also had hemodynamically significant bradycardia with EGD done earlier in May.  #2 end-stage renal disease on hemodialysis #3 history of congestive heart failure with preserved EF #4  Chronic hyponatremia sodium 129.  PLAN: IV PPI once daily Trend hemoglobin and transfuse as indicated We do not plan any further endoscopic evaluation on this patient, and would manage her supportively now and in the future for any GI bleeding. Likely needs coordination with watching her blood counts more closely as an outpatient to try to prevent repeat admissions for weakness, falls, etc related to symptomatic anemia. I am putting her on a renal diet since no plans for procedures.  Harlene BIRCH. Shaton Lore  04/18/2024, 10:26 AM

## 2024-04-18 NOTE — Consult Note (Signed)
 WOC Nurse Consult Note: patient familiar to WOC team from previous admission with multiple scattered wounds post fall, R heel wound  Reason for Consult: wounds Wound type: 1.  Full thickness wounds R elbow, R knee, R lower leg post fall red moist  2.  Full thickness R 2nd digit now appears dry  3.  Full thickness R heel pinpoint area draining uncertain etiology  4.  L heel Stage 1 Pressure Injury  Pressure Injury POA: Yes Measurement: see nursing flowsheet  Wound bed: as above  Drainage (amount, consistency, odor) see nursing flowsheet  Periwound: Dressing procedure/placement/frequency:  Cleanse scattered wounds to R elbow, R knee, R lower leg, R 2nd digit with NS, apply Mupirocin  ointment to wound beds 2 times daily and cover with silicone foam or leave open to air depending on location.  Cleanse R heel with soap and water , dry and apply Xeroform gauze Soila [phone]) every other day. Apply silicone foam to B heels and place in Prevalon boots to offload pressure Soila [phone])   POC discussed with bedside nurse. WOC team will not follow. Re-consult if further needs arise.   Thank you,    Powell Bar MSN, RN-BC, [employer]

## 2024-04-18 NOTE — Progress Notes (Signed)
   04/18/24 1615  Vitals  Temp 97.8 F (36.6 C)  Pulse Rate 87  Resp (!) 23  BP 116/63  SpO2 96 %  O2 Device Room Air  Weight 72.5 kg  Type of Weight Post-Dialysis  Post Treatment  Dialyzer Clearance Clear  Hemodialysis Intake (mL) 0 mL  Liters Processed 84  Fluid Removed (mL) 2500 mL  Tolerated HD Treatment Yes  AVG/AVF Arterial Site Held (minutes) 10 minutes  AVG/AVF Venous Site Held (minutes) 10 minutes   Received patient in bed to unit.  Alert and oriented.  Informed consent signed and in chart.   TX duration:3.5HRS  Patient tolerated well.  Transported back to the room  Alert, without acute distress.  Hand-off given to patient's nurse.   Access used: LAVG Access issues: NONE  Total UF removed: 2.5L Medication(s) given: NONE    [facility]

## 2024-04-18 NOTE — Plan of Care (Signed)
  Problem: Fluid Volume: Goal: Ability to maintain a balanced intake and output will improve Outcome: Progressing   Problem: Health Behavior/Discharge Planning: Goal: Ability to identify and utilize available resources and services will improve Outcome: Progressing   Problem: Metabolic: Goal: Ability to maintain appropriate glucose levels will improve Outcome: Progressing

## 2024-04-18 NOTE — Progress Notes (Signed)
   04/18/24 1725  Charting Type  Charting Type Full Reassessment Changes Noted  Focused Reassessment No Changes Neurological;Respiratory;Cardiac  Focused Reassessment Changes Noted Cardiac  [place] Work Intensity Score (Update with each assessment and as needed)  Work Intensity Score (Level) 3  Level 3 Intensity B.Frequent assistance requests (patient or family)  Neurological  Neuro (WDL) WDL  Orientation Level Oriented X4  Cognition Appropriate at baseline;Appropriate attention/concentration  Respiratory  Respiratory Pattern Regular;Unlabored  Chest Assessment Chest expansion symmetrical  Respiratory (WDL) X  Cardiac  Cardiac (WDL) X  Pulse Irregular (post HD sessions)  Musculoskeletal  Assistive Device Front wheel walker  Neurological  Level of Consciousness Alert   CCMD notified RN that patient is in A Fib during HD. RN notified MD and EKG ordered. EKG obtained and A Fib noted on tele strip on unit monitor. Patient says 0/10 chest pain and is asymptomatic. MD team to round to review EKG.

## 2024-04-19 DIAGNOSIS — D649 Anemia, unspecified: Secondary | ICD-10-CM | POA: Diagnosis not present

## 2024-04-19 DIAGNOSIS — N186 End stage renal disease: Secondary | ICD-10-CM | POA: Diagnosis not present

## 2024-04-19 DIAGNOSIS — Z992 Dependence on renal dialysis: Secondary | ICD-10-CM | POA: Diagnosis not present

## 2024-04-19 LAB — CBC
HCT: 26.6 % — ABNORMAL LOW (ref 36.0–46.0)
Hemoglobin: 8.4 g/dL — ABNORMAL LOW (ref 12.0–15.0)
MCH: 31.6 pg (ref 26.0–34.0)
MCHC: 31.6 g/dL (ref 30.0–36.0)
MCV: 100 fL (ref 80.0–100.0)
Platelets: 204 10*3/uL (ref 150–400)
RBC: 2.66 MIL/uL — ABNORMAL LOW (ref 3.87–5.11)
RDW: 19.9 % — ABNORMAL HIGH (ref 11.5–15.5)
WBC: 6.9 10*3/uL (ref 4.0–10.5)
nRBC: 1.2 % — ABNORMAL HIGH (ref 0.0–0.2)

## 2024-04-19 LAB — HEPATITIS B SURFACE ANTIBODY, QUANTITATIVE: Hep B S AB Quant (Post): <3.5 m[IU]/mL — ABNORMAL LOW

## 2024-04-19 LAB — RENAL FUNCTION PANEL
Albumin: 2.4 g/dL — ABNORMAL LOW (ref 3.5–5.0)
Anion gap: 12 (ref 5–15)
BUN: 33 mg/dL — ABNORMAL HIGH (ref 8–23)
CO2: 25 mmol/L (ref 22–32)
Calcium: 8.9 mg/dL (ref 8.9–10.3)
Chloride: 92 mmol/L — ABNORMAL LOW (ref 98–111)
Creatinine, Ser: 3.84 mg/dL — ABNORMAL HIGH (ref 0.44–1.00)
GFR, Estimated: 11 mL/min — ABNORMAL LOW (ref >60)
Glucose, Bld: 177 mg/dL — ABNORMAL HIGH (ref 70–99)
Phosphorus: 4.2 mg/dL (ref 2.5–4.6)
Potassium: 4.3 mmol/L (ref 3.5–5.1)
Sodium: 129 mmol/L — ABNORMAL LOW (ref 135–145)

## 2024-04-19 LAB — GLUCOSE, CAPILLARY
Glucose-Capillary: 177 mg/dL — ABNORMAL HIGH (ref 70–99)
Glucose-Capillary: 259 mg/dL — ABNORMAL HIGH (ref 70–99)

## 2024-04-19 MED ORDER — FUROSEMIDE 40 MG PO TABS
80.0000 mg | ORAL_TABLET | Freq: Every day | ORAL | Status: DC
  Administered 2024-04-19: 80 mg via ORAL
  Filled 2024-04-19: qty 2

## 2024-04-19 MED ORDER — CARVEDILOL 6.25 MG PO TABS: 6.2500 mg | ORAL_TABLET | Freq: Two times a day (BID) | ORAL | Status: DC

## 2024-04-19 NOTE — Progress Notes (Addendum)
 Pt receives out-pt HD at [facility] on TTS 11:00 am chair time. Will assist as needed.   Randine Mungo Renal Navigator [phone]  Addendum at 2:32 pm: D/C order noted. Contacted [facility] to be advised of pt's d/c today and that pt should resume care on tomorrow.

## 2024-04-19 NOTE — Discharge Summary (Signed)
 Name: Anna Carr MRN: [id number] DOB: Jul 27, 1941 83 y.o. PCP: Thurmond Cathlyn LABOR., MD  Date of Admission: 04/17/2024  4:05 PM Date of Discharge:  04/19/2024 Attending Physician: Dr. Eben  DISCHARGE DIAGNOSIS:  Primary Problem: Symptomatic anemia   Hospital Problems: Principal Problem:   Symptomatic anemia Active Problems:   CHF with left ventricular diastolic dysfunction, NYHA class 1 ([name])   Hyponatremia   Type 2 diabetes mellitus with chronic kidney disease on chronic dialysis, with long-term current use of insulin  ([name])   ESRD on hemodialysis ([name])   Hypertension   Upper GI bleed   Hematochezia   ESRD (end stage renal disease) on dialysis ([name])    DISCHARGE MEDICATIONS:   Allergies as of 04/19/2024       Reactions   Atorvastatin  Other (See Comments)   Constipation        Medication List     PAUSE taking these medications    aspirin  EC 81 MG tablet Wait to take this until your doctor or other care provider tells you to start again. Take 1 tablet (81 mg total) by mouth daily. Swallow whole.   lidocaine  5 % Wait to take this until your doctor or other care provider tells you to start again. Commonly known as: LIDODERM  Place 1 patch onto the skin daily. Remove & Discard patch within 12 hours or as directed by MD       STOP taking these medications    doxycycline  100 MG tablet Commonly known as: VIBRA -TABS       TAKE these medications    acetaminophen  500 MG tablet Commonly known as: TYLENOL  Take 500 mg by mouth every 6 (six) hours as needed for mild pain (pain score 1-3) or moderate pain (pain score 4-6).   ascorbic acid 1000 MG tablet Commonly known as: VITAMIN C Take 1,000 mg by mouth at bedtime.   Biotin 5000 MCG Tabs Take 5,000 mcg by mouth daily in the afternoon.   carboxymethylcellulose 0.5 % Soln Commonly known as: REFRESH PLUS Place 1 drop into both eyes 3 (three) times daily as needed (dry eyes).   carvedilol  6.25 MG  tablet Commonly known as: COREG  Take 6.25 mg by mouth daily.   cyanocobalamin 1000 MCG tablet Commonly known as: VITAMIN B12 Take 1,000 mcg by mouth every evening.   ferric citrate  1 GM 210 MG(Fe) tablet Commonly known as: AURYXIA  Take 210 mg by mouth 3 (three) times daily with meals.   FISH OIL PO Take 1 capsule by mouth in the morning and at bedtime.   fluticasone  50 MCG/ACT nasal spray Commonly known as: FLONASE  Place 1 spray into both nostrils daily as needed (Congestion).   furosemide  80 MG tablet Commonly known as: LASIX  Take 80 mg by mouth daily in the afternoon.   hydrocortisone  1 % ointment Apply 1 Application topically 2 (two) times daily.   ibuprofen 200 MG tablet Commonly known as: ADVIL Take 200-400 mg by mouth every 6 (six) hours as needed for mild pain (pain score 1-3).   lidocaine -prilocaine  cream Commonly known as: EMLA  Apply 1 application topically as needed (prior to fisula access). Tuesday, Thursday, Saturday for dialysis   meloxicam 7.5 MG tablet Commonly known as: MOBIC Take 7.5-15 mg by mouth daily as needed for pain.   Muscle Rub 10-15 % Crea Apply 1 Application topically 2 (two) times daily. Apply to left shoulder. What changed:  when to take this reasons to take this   pantoprazole  40 MG tablet Commonly known as: PROTONIX   Take 1 tablet (40 mg total) by mouth 2 (two) times daily.   polyethylene glycol 17 g packet Commonly known as: MIRALAX  / GLYCOLAX  Take 17 g by mouth 2 (two) times daily as needed for moderate constipation.   PROBIOTIC ACIDOPHILUS PO Take 1 capsule by mouth in the morning.   senna 8.6 MG Tabs tablet Commonly known as: SENOKOT Take 1 tablet (8.6 mg total) by mouth daily as needed for mild constipation.   Soliqua  100-33 UNT-MCG/ML Sopn Generic drug: Insulin  Glargine-Lixisenatide  Inject 6 Units into the skin in the morning.   Vitamin E 670 MG (1000 UT) Caps Take 1,000 Units by mouth in the morning.          DISPOSITION AND FOLLOW-UP:  Ms.Joanann SAMAI Carr was discharged from [facility] in stable condition. At the hospital follow up visit please address:  Follow-up Recommendations: []  Repeat hemoglobin at dialysis, transfuse if Hgb <7  Follow-up Appointments: - Patient to schedule follow-up appointment with PCP  HOSPITAL COURSE:  Patient Summary: Anna Carr is a 83 year-old female with PMH of ESRD on HD TTS, HFpEF, T2DM, HTN who presented for weakness and lightheadedness in the setting of hematochezia and admitted on 04/17/2024 for symptomatic anemia likely secondary to an upper GI bleed.   #Symptomatic anemia, acute on chronic anemia of chronic disease #Upper GI bleed Patient presented after having 2-3 episodes of hematochezia at home. Initial labs notable for hemoglobin of 6.9, so she received 1u pRBCs with post-transfusion hemoglobin 8.2. 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 Patient was treated with IV pantoprazole , which was transitioned to PO pantoprazole  40 mg daily by discharge. Hemoglobin remained stable at 8.4. Coordinated with outpatient dialysis for patient to receive transfusion if Hgb <7 when checked 1-2 times weekly.   #HFpEF Patient initially appeared volume overloaded on exam with 2+ pitting LE edema to knees, though no crackles were present on lung auscultation. Volume status slightly improved after hemodialysis with 2+ pitting LE edema to mid-shin. Her home Lasix  80 mg daily and home carvedilol  6.25 mg BID were continued by discharge, which should continue to help with volume status.   #ESRD on HD TTS #Hyponatremia Patient is on TTS hemodialysis outpatient for ESRD with last session on 6/21 before admission. Nephrology was consulted, and patient was continued on  regular dialysis schedule. Last session before discharge was on 6/24. Patient to resume outpatient dialysis as scheduled.   #T2DM Most recent HgbA1c 5.8% in 02/2024. Glucose stable between 115-256 during admission, so her home Soliqua  6 units every morning was held while hospitalized. She was instructed to restart her home insulin  dose at discharge.   #Hypertension Patient was initially hypotensive to 82/70, so her home Lasix  and carvedilol  were held. After receiving 1u pRBCs, her blood pressure improved to SBPs 118-135, so her home Lasix  80 mg daily and home carvedilol  6.25 mg BID were restarted by discharge.   #Wound Care Patient was noted to have (1) full thickness wounds on right elbow, right knee, right lower leg, and right second digit related to a fall, (2) full thickness right heel pinpoint area draining, and (3) left heel Stage 1 pressure injury. Wound care was consulted to help with management. Ordered home health to help with wound care at discharge.   SUBJECTIVE:  Patient has not had any  bowel movements since yesterday morning, and she did not notice any blood at that time. She continues to feel weaker than her baseline, but she has been able to ambulate to the bathroom without difficulties. She reports new muscle jerks that occur both at rest and when holding objects. She feels fine about discharging since GI has not interventions to offer.   Discharge Vitals:   BP (!) 122/58 (BP Location: Right Arm)   Pulse 97   Temp 98.1 F (36.7 C)   Resp 20   Ht 5' 1 (1.549 m)   Wt 72.5 kg   SpO2 98%   BMI 30.20 kg/m   OBJECTIVE:  General: Older-appearing female sitting comfortably in hospital bed while watching TV, in NAD Cardiovascular: Normal rate with regular rhythm. No murmurs, rubs, or gallops. 2+ pitting symmetric LE edema to mid-shin. AV fistula on left UE with palpable thrill and bruit on auscultation. Pulmonary: Lungs CTAB. No wheezes, rales, or rhonchi. Normal respiratory  effort on room air. Abdominal: Soft. Non-distended. Normal bowel sounds.   Neurological: Alert, responds appropriately to questions. Skin: Warm and dry. Multiple scattered wounds and bruises from recent falls.  Pertinent Labs, Studies, and Procedures:     Latest Ref Rng & Units 04/19/2024    4:47 AM 04/18/2024    6:35 PM 04/18/2024    5:18 AM  CBC  WBC 4.0 - 10.5 K/uL 6.9   7.3   Hemoglobin 12.0 - 15.0 g/dL 8.4  8.9  8.2   Hematocrit 36.0 - 46.0 % 26.6  27.6  25.5   Platelets 150 - 400 K/uL 204   227        Latest Ref Rng & Units 04/19/2024    4:47 AM 04/18/2024    5:18 AM 04/17/2024    4:31 PM  CMP  Glucose 70 - 99 mg/dL 822  788  814   BUN 8 - 23 mg/dL 33  62  57   Creatinine 0.44 - 1.00 mg/dL 6.15  4.59  4.90   Sodium 135 - 145 mmol/L 129  129  128   Potassium 3.5 - 5.1 mmol/L 4.3  5.1  4.9   Chloride 98 - 111 mmol/L 92  91  89   CO2 22 - 32 mmol/L 25  20  23    Calcium  8.9 - 10.3 mg/dL 8.9  9.0  9.0   Total Protein 6.5 - 8.1 g/dL   5.5   Total Bilirubin 0.0 - 1.2 mg/dL   1.0   Alkaline Phos 38 - 126 U/L   117   AST 15 - 41 U/L   28   ALT 0 - 44 U/L   21     DG CHEST PORT 1 VIEW Result Date: 04/18/2024 CLINICAL DATA:  Cough and weakness EXAM: PORTABLE CHEST 1 VIEW COMPARISON:  chest x-ray 04/12/2024 FINDINGS: Heart is enlarged. There is central pulmonary vascular congestion. There are patchy opacities in lung bases and small right pleural effusion. No acute fractures are seen. IMPRESSION: 1. Cardiomegaly with central pulmonary vascular congestion. 2. Patchy opacities in lung bases and small right pleural effusion. Electronically Signed   By: Greig Pique M.D.   On: 04/18/2024 00:08   CT Angio Abd/Pel W and/or Wo Contrast Result Date: 04/17/2024 CLINICAL DATA:  Lower GI bleeding EXAM: CTA ABDOMEN AND PELVIS WITHOUT AND WITH CONTRAST TECHNIQUE: Multidetector CT imaging of the abdomen and pelvis was performed using the standard protocol during bolus administration of intravenous  contrast. Multiplanar reconstructed images and MIPs  were obtained and reviewed to evaluate the vascular anatomy. RADIATION DOSE REDUCTION: This exam was performed according to the departmental dose-optimization program which includes automated exposure control, adjustment of the mA and/or kV according to patient size and/or use of iterative reconstruction technique. CONTRAST:  75mL OMNIPAQUE  IOHEXOL  350 MG/ML SOLN COMPARISON:  None Available. FINDINGS: VASCULAR Aorta: Abdominal aorta demonstrates diffuse atherosclerotic calcifications. No aneurysmal dilatation or dissection is noted. Celiac: Heavy calcification is noted in the origin with only mild stenosis. SMA: Heavy calcification is noted in the origin with only mild stenosis. Renals: Renal arteries demonstrate atherosclerotic calcification. The right renal arteries are diminutive. High-grade stenosis in the left renal artery is noted as well. IMA: Patent without evidence of aneurysm, dissection, vasculitis or significant stenosis. Inflow: Atherosclerotic calcifications are noted with mild areas of stenoses throughout system. The common femoral artery on right is heavily calcified with significant stenosis noted. Stenosis at the origin of the right SFA is seen as well. Veins: Venous structures show no specific abnormality. Review of the MIP images confirms the above findings. NON-VASCULAR Lower chest: Bilateral pleural effusions are noted right considerably larger than left. Mild basilar atelectasis is noted bilaterally. Hepatobiliary: Gallbladder is well distended with multiple gallstones. No definitive complicating factors are seen. Heterogeneity of the liver is noted without focal mass. This may be related to the timing of the contrast bolus. Pancreas: Unremarkable. No pancreatic ductal dilatation or surrounding inflammatory changes. Spleen: Normal in size without focal abnormality. Adrenals/Urinary Tract: Adrenal glands are within normal limits. Kidneys are  atrophic consistent with renal arterial disease. Bladder is decompressed. Stomach/Bowel: Scattered diverticular change of the colon is noted without evidence of diverticulitis. The appendix has been surgically removed. Small bowel and stomach appear within normal limits. No active extravasation or pooling of contrast on delayed images is identified to suggest acute GI hemorrhage. Lymphatic: No significant lymphadenopathy is noted. Reproductive: Uterus and bilateral adnexa are unremarkable. Other: Mild free fluid in the pelvis is noted of uncertain significance. No herniation is seen. Mild changes of anasarca are noted. Musculoskeletal: Bony structures are within normal limits. IMPRESSION: VASCULAR Extensive atherosclerotic calcifications are noted. Diminutive renal arteries with associated renal atrophy. No evidence of active extravasation or delayed pooling to suggest active GI hemorrhage. NON-VASCULAR Diverticulosis without diverticulitis. Large bilateral pleural effusions with associated atelectatic changes. Cholelithiasis without complicating factors. Mild free fluid in the pelvis with changes of anasarca. Electronically Signed   By: Oneil Devonshire M.D.   On: 04/17/2024 21:24     Signed: Drue Lisa Grow MD 04/19/2024, 12:07 PM

## 2024-04-19 NOTE — Discharge Planning (Signed)
 [facility] Patient Discharge Orders- [facility] CLINIC: [place]  Patient's name: Anna Carr Admit/DC Dates: 04/17/2024 - 04/19/2024  Discharge Diagnoses: Acute/chronic anemia/Recurrent GIB. S/p transfusion   Recent Labs  Lab 04/19/24 0447  HGB 8.4*  K 4.3  CALCIUM  8.9  PHOS 4.2  ALBUMIN  2.4*    Anemia Orders: Aranesp : Given: --     Date of last dose/amount: --   PRBC's Given: Y    Date/# of units: 6/23 1 unit  ESA dose for discharge:  Mircera 150 mcg IV q 2 wks    Outpatient Dialysis Orders:  -Heparin : None  -EDW 71 kg   -[place]: No change    Access intervention/Change: --    OTHER/APPTS/LABS     Completed by: Maisie Ronnald Acosta PA-C   D/C Meds to be reconciled by nurse after every discharge.    Reviewed by: MD:______ RN_______

## 2024-04-19 NOTE — Discharge Instructions (Signed)
 It was a pleasure taking care of you at [facility]! You were admitted for anemia from a GI bleed. You received one blood transfusion while hospitalized, which increased your blood counts to an acceptable level (hemoglobin 8.4). You were also seen by the GI team, who did not recommend any procedures since your heart stopped during the last endoscopy. You will be treated with Protonix  40 mg once daily and blood transfusions as needed at dialysis. Please schedule an appointment with your primary care doctor to see how you are doing after being discharged from the hospital.

## 2024-04-19 NOTE — TOC Transition Note (Signed)
 Transition of Care [facility]) - Discharge Note   Patient Details  Name: Anna Carr MRN: [id number] Date of Birth: [birth year]  [facility] CM/SW Contact:  Tom-Johnson, Harvest Muskrat, RN Phone Number: 04/19/2024, 3:12 PM   Clinical Narrative:     Patient is scheduled for discharge today.  Home health resumption of care info, hospital f/u and discharge instructions on AVS. Daughter at bedside and will transport at discharge.  No further TOC needs noted.        Final next level of care: Home w Home Health Services Barriers to Discharge: Barriers Resolved   Patient Goals and CMS Choice Patient states their goals for this hospitalization and ongoing recovery are:: To return home CMS Medicare.gov Compare Post Acute Care list provided to:: Patient Choice offered to / list presented to : Patient, Adult Children (Daughter, Verneita)      Discharge Placement                Patient to be transferred to facility by: Daughter Name of family member notified: [facility]    Discharge Plan and Services Additional resources added to the After Visit Summary for                  DME Arranged: N/A DME Agency: NA       HH Arranged: PT, OT, RN, Disease Management HH Agency: [facility] Date [facility] Agency Contacted: 04/19/24 Time HH Agency Contacted: 1500 Representative spoke with at [facility] Agency: Reena  Social Drivers of Health (SDOH) Interventions SDOH Screenings   Food Insecurity: No Food Insecurity (04/18/2024)  Housing: Low Risk  (04/18/2024)  Transportation Needs: No Transportation Needs (04/18/2024)  Utilities: Not At Risk (04/18/2024)  Financial Resource Strain: Low Risk  (10/27/2021)   Received from [facility]  Social Connections: Socially Isolated (04/18/2024)  Stress: No Stress Concern Present (10/27/2021)   Received from [facility]  Tobacco Use: Medium Risk (04/17/2024)     Readmission Risk Interventions    04/11/2024    2:26 PM   Readmission Risk Prevention Plan  Transportation Screening Complete  PCP or Specialist Appt within 5-7 Days Complete  Home Care Screening Complete  Medication Review (RN CM) Referral to Pharmacy

## 2024-04-19 NOTE — Evaluation (Signed)
 Physical Therapy Evaluation Patient Details Name: Anna Carr MRN: [id number] DOB: 04/18/1941 Today's Date: 04/19/2024  History of Present Illness  Patient is 83 y.o. female presenting with acute bloody bowel movements on 04/17/24 and admitted for recurrent GIB. Patient recently admitted 5/2-5/17 then again 6/16-6/20/2025 with a GIB, with hospital course complicated by bradycardic arrest in endoscopy suite on 03/02/24 requiring CPR and ICU stay. and 5/19. PMH significant for ESRD on HD TThS  (recent declot of AVG by IR 6/18), T2DM, nonrheumatic mitral valve regurgitation, HFpEF, advanced PVD, anemia of chronic disease, and hx of duodenal/jejunal angioectasias in 2022, and hx of falls.   Clinical Impression  Anna Carr is 83 y.o. female admitted with above HPI and diagnosis. Patient is currently limited by functional impairments below (see PT problem list). Patient lives with family and is mod ind with RW for short household distances with assist. Pt able to complete transfers and gait with RW and CGA for safety to amb ~60'. LE's fatiguing with distance and knee flexing but no buckling. Seated rest provided. Family present for entire session and questions addressed regarding HHPT f/u, safe technique with gait belt to transfer, amb, and shift weight for LOB to slow or prevent fall. Educated on safe strategies for positioning chair around home for seated rest breaks as needed. Patient will benefit from continued skilled PT interventions to address impairments and progress independence with mobility, recommending HHPT follow up with 24/7 assist/supervision from family. Acute PT will follow and progress as able.     Family expressed concerns regarding how to obtain OP follow up for monitoring H&H and for pt to receive transfusion if needed. Family also requesting additional re-check of H&H prior to discharge home due to increased dizziness/lightheadedness with mobility. MD notified via secure chat.        If plan is discharge home, recommend the following: A little help with walking and/or transfers;A little help with bathing/dressing/bathroom;Assistance with cooking/housework;Direct supervision/assist for medications management;Direct supervision/assist for financial management;Assist for transportation;Help with stairs or ramp for entrance   Can travel by private vehicle        Equipment Recommendations Hospital bed (Air mattress overlay for pressure injuries)  Recommendations for Other Services       Functional Status Assessment Patient has had a recent decline in their functional status and demonstrates the ability to make significant improvements in function in a reasonable and predictable amount of time.     Precautions / Restrictions Precautions Precautions: Fall Recall of Precautions/Restrictions: Intact Precaution/Restrictions Comments: BP, HR, SpO2 Restrictions Weight Bearing Restrictions Per Provider Order: No      Mobility  Bed Mobility               General bed mobility comments: OOB in recliner at start, seated EOB at end of session, NT arrived to assist pt to bathroom.    Transfers Overall transfer level: Needs assistance Equipment used: Rolling walker (2 wheels) Transfers: Sit to/from Stand Sit to Stand: Contact guard assist, Supervision           General transfer comment: pt reliant on use of UE's to assist with power up and reach back to sit.    Ambulation/Gait Ambulation/Gait assistance: Contact guard assist Gait Distance (Feet): 60 Feet Assistive device: Rolling walker (2 wheels) Gait Pattern/deviations: Step-through pattern, Decreased stride length, Trunk flexed Gait velocity: decr     General Gait Details: pt maintained safe proximity to RW until fatigued then walker drifting slightly anterior. pt fatigued with knees flexingbut  no full buckling. chair follow for safety  Stairs            Wheelchair Mobility     Tilt Bed     Modified Rankin (Stroke Patients Only)       Balance Overall balance assessment: Needs assistance Sitting-balance support: Feet supported Sitting balance-Leahy Scale: Fair Sitting balance - Comments: Pt sat EOB with supervision   Standing balance support: Bilateral upper extremity supported, During functional activity, Reliant on assistive device for balance Standing balance-Leahy Scale: Poor                               Pertinent Vitals/Pain Pain Assessment Pain Assessment: No/denies pain    Home Living Family/patient expects to be discharged to:: Private residence Living Arrangements: Children Available Help at Discharge: Family;Available 24 hours/day Type of Home: House Home Access: Ramped entrance       Home Layout: One level Home Equipment: Rolling Walker (2 wheels);Grab bars - tub/shower;Wheelchair - manual;Rollator (4 wheels);BSC/3in1;Cane - single point;Shower seat      Prior Function Prior Level of Function : Needs assist;History of Falls (last six months)             Mobility Comments: RW in the home, w/c to HD, RW does not fit into bathroom, holds onto walls in bathroom ADLs Comments: manages own meds, set up A for ADLs     Extremity/Trunk Assessment   Upper Extremity Assessment Upper Extremity Assessment: Generalized weakness    Lower Extremity Assessment Lower Extremity Assessment: Generalized weakness    Cervical / Trunk Assessment Cervical / Trunk Assessment: Kyphotic  Communication   Communication Communication: Impaired Factors Affecting Communication: Hearing impaired    Cognition Arousal: Alert Behavior During Therapy: WFL for tasks assessed/performed   PT - Cognitive impairments: No apparent impairments                         Following commands: Intact       Cueing Cueing Techniques: Verbal cues     General Comments      Exercises     Assessment/Plan    PT Assessment Patient needs  continued PT services  PT Problem List Decreased activity tolerance;Decreased mobility;Cardiopulmonary status limiting activity;Pain;Decreased skin integrity;Decreased strength       PT Treatment Interventions DME instruction;Gait training;Functional mobility training;Therapeutic exercise;Therapeutic activities;Balance training;Cognitive remediation;Patient/family education    PT Goals (Current goals can be found in the Care Plan section)  Acute Rehab PT Goals Patient Stated Goal: Feel stronger and move independently. PT Goal Formulation: With patient/family Time For Goal Achievement: 04/25/24 Potential to Achieve Goals: Fair    Frequency Min 2X/week     Co-evaluation               AM-PAC PT 6 Clicks Mobility  Outcome Measure Help needed turning from your back to your side while in a flat bed without using bedrails?: None Help needed moving from lying on your back to sitting on the side of a flat bed without using bedrails?: A Little Help needed moving to and from a bed to a chair (including a wheelchair)?: A Little Help needed standing up from a chair using your arms (e.g., wheelchair or bedside chair)?: A Little Help needed to walk in hospital room?: A Little Help needed climbing 3-5 steps with a railing? : A Lot 6 Click Score: 18    End of Session Equipment Utilized During Treatment:  Gait belt Activity Tolerance: Patient tolerated treatment well Patient left: in bed;with call bell/phone within reach;with nursing/sitter in room;with family/visitor present Nurse Communication: Mobility status PT Visit Diagnosis: Unsteadiness on feet (R26.81);Other abnormalities of gait and mobility (R26.89);Muscle weakness (generalized) (M62.81);Difficulty in walking, not elsewhere classified (R26.2);Pain    Time: 8597-8550 PT Time Calculation (min) (ACUTE ONLY): 47 min   Charges:   PT Evaluation $PT Eval Moderate Complexity: 1 Mod PT Treatments $Gait Training: 8-22  mins $Therapeutic Activity: 8-22 mins PT General Charges $$ ACUTE PT VISIT: 1 Visit         Vernell DONEEN KLEIN, DPT Acute Rehabilitation Services Office [phone]  04/19/24 3:09 PM

## 2024-04-19 NOTE — Progress Notes (Signed)
 [facility] Progress Note   Subjective: Seen in room. Daughter at bedside. Had dialysis yesterday - net UF 2.5L no issues reported. Sleepy this am. Hgb stable.   Objective Vitals:   04/19/24 0603 04/19/24 0724 04/19/24 0800 04/19/24 0900  BP: (!) 118/58 (!) 122/58    Pulse: 100 97    Resp: 18 19 20 20   Temp: 97.8 F (36.6 C) 98.1 F (36.7 C)    TempSrc: Oral     SpO2: 95% 98%    Weight:      Height:         Additional Objective Labs: Basic Metabolic Panel: Recent Labs  Lab 04/13/24 1258 04/14/24 1058 04/17/24 1631 04/18/24 0518 04/19/24 0447  NA 130*   < > 128* 129* 129*  K 4.3   < > 4.9 5.1 4.3  CL 90*   < > 89* 91* 92*  CO2 23   < > 23 20* 25  GLUCOSE 236*   < > 185* 211* 177*  BUN 44*   < > 57* 62* 33*  CREATININE 4.44*   < > 5.09* 5.40* 3.84*  CALCIUM  8.6*   < > 9.0 9.0 8.9  PHOS 3.7  --   --  5.2* 4.2   < > = values in this interval not displayed.   CBC: Recent Labs  Lab 04/12/24 2137 04/13/24 0941 04/13/24 1258 04/14/24 0354 04/17/24 1631 04/18/24 0518 04/18/24 1835 04/19/24 0447  WBC 7.6  --  6.2 5.4 7.4 7.3  --  6.9  NEUTROABS 6.4  --   --   --   --   --   --   --   HGB 11.5*   < > 9.8* 9.8* 6.9* 8.2* 8.9* 8.4*  HCT 35.4*   < > 30.9* 31.1* 22.2* 25.5* 27.6* 26.6*  MCV 96.2  --  97.2 100.6* 102.3* 98.1  --  100.0  PLT 156  --  163 139* 234 227  --  204   < > = values in this interval not displayed.   Blood Culture    Component Value Date/Time   SDES PLEURAL 04/11/2024 1101   SPECREQUEST NONE 04/11/2024 1101   CULT  04/11/2024 1101    NO GROWTH 3 DAYS Performed at [facility], [address], [place], [place] [address]    REPTSTATUS 04/14/2024 FINAL 04/11/2024 1101     Physical Exam General: Lying in bed, nad, room air  Heart: RRR  Lungs: Clear  Abdomen: soft non-tender Extremities: 1+ LE edema, has pitting hip edema  Dialysis Access: LUE AVF +bruit   Medications:   sodium chloride    Intravenous Once    carvedilol   6.25 mg Oral BID WC   folic acid   1 mg Oral Daily   furosemide   80 mg Oral Daily   insulin  aspart  0-5 Units Subcutaneous QHS   insulin  aspart  0-9 Units Subcutaneous TID WC   mupirocin  ointment   Topical BID   pantoprazole  (PROTONIX ) IV  40 mg Intravenous Q12H    Dialysis Orders:  TTS  [place] EDW 71 kg 3.5 hours 2K/2.5 Ca BF 400  DF auto 1.5 Last HD on 6/21 with post weight of 72.7 kg Last ESA on 04/14/24 - 40 mcg aranesp  and was to begin mircera 150 mcg every 2 weeks outpatient but hadn't gotten a dose Other meds: calcitriol  0.25 mcg three times a week   Assessment/Plan: Recurrent GIB/ABLA. Hx of AVMs. S/p 2 units prbcs. GI consulted. Of note  had intra procedure cardiac arrest during EGD in May. Hgb 8.4. Follow trends.  ESRD - HD TTS. Recent declot of AVG by IR 6/18. Next HD Thurs.  HTN. BP acceptable.  Volume. Optimize volume with HD as able.  Anemia. As above. Last ESA on 6/20.  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    Maisie Ronnald Acosta PA-C [name] 04/19/2024,12:03 PM

## 2024-04-19 NOTE — Care Management Obs Status (Signed)
 B  MEDICARE OBSERVATION STATUS NOTIFICATION   Patient Details  Name: Anna Carr MRN: [id number] Date of Birth: [birth year]   Medicare Observation Status Notification Given:  Yes    Tom-Johnson, Harvest Muskrat, RN 04/19/2024, 9:42 AM

## 2024-04-20 LAB — GLUCOSE, CAPILLARY: Glucose-Capillary: 101 mg/dL — ABNORMAL HIGH (ref 70–99)

## 2024-04-21 LAB — TYPE AND SCREEN
ABO/RH(D): A POS
Antibody Screen: NEGATIVE
Unit division: 0
Unit division: 0

## 2024-04-21 LAB — BPAM RBC
Blood Product Expiration Date: 202507172359
Blood Product Expiration Date: 202507182359
ISSUE DATE / TIME: 202506232018
Unit Type and Rh: 6200
Unit Type and Rh: 6200

## 2024-04-23 ENCOUNTER — Encounter (HOSPITAL_COMMUNITY): Payer: Self-pay

## 2024-04-24 ENCOUNTER — Inpatient Hospital Stay (HOSPITAL_COMMUNITY)
Admission: EM | Admit: 2024-04-24 | Discharge: 2024-04-29 | DRG: 299 | Disposition: A | Discharge status: Home / Self Care | Source: Other Acute Inpatient Hospital | Attending: Internal Medicine | Admitting: Internal Medicine

## 2024-04-24 ENCOUNTER — Other Ambulatory Visit: Payer: Self-pay

## 2024-04-24 ENCOUNTER — Encounter (HOSPITAL_COMMUNITY): Payer: Self-pay | Admitting: Internal Medicine

## 2024-04-24 DIAGNOSIS — E785 Hyperlipidemia, unspecified: Secondary | ICD-10-CM | POA: Diagnosis present

## 2024-04-24 DIAGNOSIS — Z8249 Family history of ischemic heart disease and other diseases of the circulatory system: Secondary | ICD-10-CM

## 2024-04-24 DIAGNOSIS — D62 Acute posthemorrhagic anemia: Secondary | ICD-10-CM | POA: Diagnosis not present

## 2024-04-24 DIAGNOSIS — Z992 Dependence on renal dialysis: Secondary | ICD-10-CM | POA: Diagnosis not present

## 2024-04-24 DIAGNOSIS — N186 End stage renal disease: Secondary | ICD-10-CM | POA: Diagnosis present

## 2024-04-24 DIAGNOSIS — E871 Hypo-osmolality and hyponatremia: Secondary | ICD-10-CM | POA: Diagnosis present

## 2024-04-24 DIAGNOSIS — I5030 Unspecified diastolic (congestive) heart failure: Secondary | ICD-10-CM | POA: Diagnosis not present

## 2024-04-24 DIAGNOSIS — E1122 Type 2 diabetes mellitus with diabetic chronic kidney disease: Secondary | ICD-10-CM | POA: Diagnosis present

## 2024-04-24 DIAGNOSIS — I272 Pulmonary hypertension, unspecified: Secondary | ICD-10-CM | POA: Diagnosis present

## 2024-04-24 DIAGNOSIS — R531 Weakness: Secondary | ICD-10-CM | POA: Diagnosis not present

## 2024-04-24 DIAGNOSIS — Z794 Long term (current) use of insulin: Secondary | ICD-10-CM

## 2024-04-24 DIAGNOSIS — Z8616 Personal history of COVID-19: Secondary | ICD-10-CM

## 2024-04-24 DIAGNOSIS — I10 Essential (primary) hypertension: Secondary | ICD-10-CM | POA: Diagnosis not present

## 2024-04-24 DIAGNOSIS — M8580 Other specified disorders of bone density and structure, unspecified site: Secondary | ICD-10-CM | POA: Diagnosis present

## 2024-04-24 DIAGNOSIS — S91301A Unspecified open wound, right foot, initial encounter: Secondary | ICD-10-CM | POA: Diagnosis not present

## 2024-04-24 DIAGNOSIS — Z7902 Long term (current) use of antithrombotics/antiplatelets: Secondary | ICD-10-CM

## 2024-04-24 DIAGNOSIS — Z743 Need for continuous supervision: Secondary | ICD-10-CM | POA: Diagnosis not present

## 2024-04-24 DIAGNOSIS — I70221 Atherosclerosis of native arteries of extremities with rest pain, right leg: Secondary | ICD-10-CM | POA: Diagnosis present

## 2024-04-24 DIAGNOSIS — L97519 Non-pressure chronic ulcer of other part of right foot with unspecified severity: Secondary | ICD-10-CM | POA: Diagnosis not present

## 2024-04-24 DIAGNOSIS — I132 Hypertensive heart and chronic kidney disease with heart failure and with stage 5 chronic kidney disease, or end stage renal disease: Secondary | ICD-10-CM | POA: Diagnosis present

## 2024-04-24 DIAGNOSIS — L03115 Cellulitis of right lower limb: Secondary | ICD-10-CM | POA: Diagnosis present

## 2024-04-24 DIAGNOSIS — E1151 Type 2 diabetes mellitus with diabetic peripheral angiopathy without gangrene: Secondary | ICD-10-CM | POA: Diagnosis present

## 2024-04-24 DIAGNOSIS — Y92230 Patient room in hospital as the place of occurrence of the external cause: Secondary | ICD-10-CM | POA: Diagnosis present

## 2024-04-24 DIAGNOSIS — Z860100 Personal history of colon polyps, unspecified: Secondary | ICD-10-CM

## 2024-04-24 DIAGNOSIS — I739 Peripheral vascular disease, unspecified: Secondary | ICD-10-CM | POA: Diagnosis present

## 2024-04-24 DIAGNOSIS — I5032 Chronic diastolic (congestive) heart failure: Secondary | ICD-10-CM | POA: Diagnosis present

## 2024-04-24 DIAGNOSIS — D500 Iron deficiency anemia secondary to blood loss (chronic): Secondary | ICD-10-CM | POA: Diagnosis present

## 2024-04-24 DIAGNOSIS — J449 Chronic obstructive pulmonary disease, unspecified: Secondary | ICD-10-CM | POA: Diagnosis present

## 2024-04-24 DIAGNOSIS — Z7982 Long term (current) use of aspirin: Secondary | ICD-10-CM

## 2024-04-24 DIAGNOSIS — N250 Renal osteodystrophy: Secondary | ICD-10-CM | POA: Diagnosis present

## 2024-04-24 DIAGNOSIS — I509 Heart failure, unspecified: Secondary | ICD-10-CM | POA: Diagnosis not present

## 2024-04-24 DIAGNOSIS — I953 Hypotension of hemodialysis: Secondary | ICD-10-CM | POA: Diagnosis not present

## 2024-04-24 DIAGNOSIS — E669 Obesity, unspecified: Secondary | ICD-10-CM | POA: Diagnosis present

## 2024-04-24 DIAGNOSIS — Z79899 Other long term (current) drug therapy: Secondary | ICD-10-CM

## 2024-04-24 DIAGNOSIS — R55 Syncope and collapse: Secondary | ICD-10-CM | POA: Diagnosis present

## 2024-04-24 DIAGNOSIS — K5900 Constipation, unspecified: Secondary | ICD-10-CM | POA: Diagnosis not present

## 2024-04-24 DIAGNOSIS — K31819 Angiodysplasia of stomach and duodenum without bleeding: Secondary | ICD-10-CM | POA: Diagnosis not present

## 2024-04-24 DIAGNOSIS — D631 Anemia in chronic kidney disease: Secondary | ICD-10-CM | POA: Diagnosis present

## 2024-04-24 DIAGNOSIS — Z888 Allergy status to other drugs, medicaments and biological substances status: Secondary | ICD-10-CM

## 2024-04-24 DIAGNOSIS — Z833 Family history of diabetes mellitus: Secondary | ICD-10-CM | POA: Diagnosis not present

## 2024-04-24 DIAGNOSIS — W06XXXA Fall from bed, initial encounter: Secondary | ICD-10-CM | POA: Diagnosis not present

## 2024-04-24 DIAGNOSIS — Z87891 Personal history of nicotine dependence: Secondary | ICD-10-CM | POA: Diagnosis not present

## 2024-04-24 DIAGNOSIS — I998 Other disorder of circulatory system: Secondary | ICD-10-CM | POA: Diagnosis not present

## 2024-04-24 DIAGNOSIS — N2581 Secondary hyperparathyroidism of renal origin: Secondary | ICD-10-CM | POA: Diagnosis present

## 2024-04-24 DIAGNOSIS — K5520 Angiodysplasia of colon without hemorrhage: Secondary | ICD-10-CM | POA: Diagnosis present

## 2024-04-24 DIAGNOSIS — L259 Unspecified contact dermatitis, unspecified cause: Secondary | ICD-10-CM | POA: Diagnosis not present

## 2024-04-24 DIAGNOSIS — M62262 Nontraumatic ischemic infarction of muscle, left lower leg: Secondary | ICD-10-CM | POA: Diagnosis not present

## 2024-04-24 DIAGNOSIS — D539 Nutritional anemia, unspecified: Secondary | ICD-10-CM | POA: Diagnosis present

## 2024-04-24 DIAGNOSIS — Z8674 Personal history of sudden cardiac arrest: Secondary | ICD-10-CM

## 2024-04-24 DIAGNOSIS — K922 Gastrointestinal hemorrhage, unspecified: Secondary | ICD-10-CM | POA: Diagnosis not present

## 2024-04-24 LAB — RENAL FUNCTION PANEL
Albumin: 2.7 g/dL — ABNORMAL LOW (ref 3.5–5.0)
Anion gap: 16 — ABNORMAL HIGH (ref 5–15)
BUN: 43 mg/dL — ABNORMAL HIGH (ref 8–23)
CO2: 25 mmol/L (ref 22–32)
Calcium: 8.8 mg/dL — ABNORMAL LOW (ref 8.9–10.3)
Chloride: 91 mmol/L — ABNORMAL LOW (ref 98–111)
Creatinine, Ser: 5.6 mg/dL — ABNORMAL HIGH (ref 0.44–1.00)
GFR, Estimated: 7 mL/min — ABNORMAL LOW (ref >60)
Glucose, Bld: 202 mg/dL — ABNORMAL HIGH (ref 70–99)
Phosphorus: 6.7 mg/dL — ABNORMAL HIGH (ref 2.5–4.6)
Potassium: 4.9 mmol/L (ref 3.5–5.1)
Sodium: 132 mmol/L — ABNORMAL LOW (ref 135–145)

## 2024-04-24 LAB — CBC WITH DIFFERENTIAL/PLATELET
Abs Immature Granulocytes: 0.02 10*3/uL (ref 0.00–0.07)
Basophils Absolute: 0.1 10*3/uL (ref 0.0–0.1)
Basophils Relative: 1 %
Eosinophils Absolute: 0.1 10*3/uL (ref 0.0–0.5)
Eosinophils Relative: 1 %
HCT: 28.4 % — ABNORMAL LOW (ref 36.0–46.0)
Hemoglobin: 8.7 g/dL — ABNORMAL LOW (ref 12.0–15.0)
Immature Granulocytes: 0 %
Lymphocytes Relative: 14 %
Lymphs Abs: 0.9 10*3/uL (ref 0.7–4.0)
MCH: 31.5 pg (ref 26.0–34.0)
MCHC: 30.6 g/dL (ref 30.0–36.0)
MCV: 102.9 fL — ABNORMAL HIGH (ref 80.0–100.0)
Monocytes Absolute: 0.4 10*3/uL (ref 0.1–1.0)
Monocytes Relative: 7 %
Neutro Abs: 5.1 10*3/uL (ref 1.7–7.7)
Neutrophils Relative %: 77 %
Platelets: 179 10*3/uL (ref 150–400)
RBC: 2.76 MIL/uL — ABNORMAL LOW (ref 3.87–5.11)
RDW: 19.5 % — ABNORMAL HIGH (ref 11.5–15.5)
WBC: 6.6 10*3/uL (ref 4.0–10.5)
nRBC: 0 % (ref 0.0–0.2)

## 2024-04-24 LAB — GLUCOSE, CAPILLARY: Glucose-Capillary: 176 mg/dL — ABNORMAL HIGH (ref 70–99)

## 2024-04-24 MED ORDER — FERRIC CITRATE 1 GM 210 MG(FE) PO TABS: 210.0000 mg | ORAL_TABLET | Freq: Three times a day (TID) | ORAL | Status: DC

## 2024-04-24 MED ORDER — BIOTIN 5000 MCG PO TABS: 5000.0000 ug | ORAL_TABLET | Freq: Every day | ORAL | Status: DC

## 2024-04-24 MED ORDER — ASPIRIN 81 MG PO TBEC
81.0000 mg | DELAYED_RELEASE_TABLET | Freq: Every day | ORAL | Status: DC
  Administered 2024-04-25 – 2024-04-29 (×4): 81 mg via ORAL
  Filled 2024-04-24 (×4): qty 1

## 2024-04-24 MED ORDER — SENNA 8.6 MG PO TABS
1.0000 | ORAL_TABLET | Freq: Every day | ORAL | Status: DC | PRN
  Administered 2024-04-26 – 2024-04-27 (×2): 8.6 mg via ORAL
  Filled 2024-04-24 (×2): qty 1

## 2024-04-24 MED ORDER — POLYVINYL ALCOHOL 1.4 % OP SOLN: 1.0000 [drp] | Freq: Three times a day (TID) | OPHTHALMIC | Status: DC | PRN

## 2024-04-24 MED ORDER — VITAMIN E 180 MG (400 UNIT) PO CAPS
400.0000 [IU] | ORAL_CAPSULE | Freq: Every morning | ORAL | Status: DC
  Administered 2024-04-25 – 2024-04-29 (×5): 400 [IU] via ORAL
  Filled 2024-04-24 (×5): qty 1

## 2024-04-24 MED ORDER — VITAMIN B-12 1000 MCG PO TABS
1000.0000 ug | ORAL_TABLET | Freq: Every evening | ORAL | Status: DC
  Administered 2024-04-24 – 2024-04-28 (×5): 1000 ug via ORAL
  Filled 2024-04-24 (×5): qty 1

## 2024-04-24 MED ORDER — ACETAMINOPHEN 500 MG PO TABS
1000.0000 mg | ORAL_TABLET | Freq: Three times a day (TID) | ORAL | Status: DC | PRN
  Administered 2024-04-25: 1000 mg via ORAL
  Filled 2024-04-24: qty 2

## 2024-04-24 MED ORDER — INSULIN ASPART 100 UNIT/ML IJ SOLN
0.0000 [IU] | Freq: Three times a day (TID) | INTRAMUSCULAR | Status: DC
  Administered 2024-04-25: 2 [IU] via SUBCUTANEOUS
  Administered 2024-04-25 – 2024-04-26 (×2): 1 [IU] via SUBCUTANEOUS

## 2024-04-24 MED ORDER — FUROSEMIDE 40 MG PO TABS
80.0000 mg | ORAL_TABLET | Freq: Every day | ORAL | Status: DC
  Administered 2024-04-24 – 2024-04-28 (×5): 80 mg via ORAL
  Filled 2024-04-24 (×5): qty 2

## 2024-04-24 MED ORDER — FLUTICASONE PROPIONATE 50 MCG/ACT NA SUSP: 1.0000 | Freq: Every day | NASAL | Status: DC | PRN

## 2024-04-24 MED ORDER — PANTOPRAZOLE SODIUM 40 MG PO TBEC
40.0000 mg | DELAYED_RELEASE_TABLET | Freq: Two times a day (BID) | ORAL | Status: DC
  Administered 2024-04-24 – 2024-04-29 (×10): 40 mg via ORAL
  Filled 2024-04-24 (×10): qty 1

## 2024-04-24 MED ORDER — HYDROCORTISONE 1 % EX OINT
1.0000 | TOPICAL_OINTMENT | Freq: Two times a day (BID) | CUTANEOUS | Status: DC
  Administered 2024-04-24 – 2024-04-29 (×10): 1 via TOPICAL
  Filled 2024-04-24: qty 28

## 2024-04-24 MED ORDER — HEPARIN SODIUM (PORCINE) 5000 UNIT/ML IJ SOLN
5000.0000 [IU] | Freq: Three times a day (TID) | INTRAMUSCULAR | Status: DC
  Administered 2024-04-24 – 2024-04-28 (×12): 5000 [IU] via SUBCUTANEOUS
  Filled 2024-04-24 (×13): qty 1

## 2024-04-24 MED ORDER — MUSCLE RUB 10-15 % EX CREA: 1.0000 | TOPICAL_CREAM | Freq: Two times a day (BID) | CUTANEOUS | Status: DC | PRN

## 2024-04-24 MED ORDER — POLYETHYLENE GLYCOL 3350 17 G PO PACK
17.0000 g | PACK | Freq: Two times a day (BID) | ORAL | Status: DC | PRN
  Administered 2024-04-26: 17 g via ORAL
  Filled 2024-04-24: qty 1

## 2024-04-24 MED ORDER — INSULIN ASPART 100 UNIT/ML IJ SOLN
2.0000 [IU] | Freq: Once | INTRAMUSCULAR | Status: AC
  Administered 2024-04-24: 2 [IU] via SUBCUTANEOUS

## 2024-04-24 MED ORDER — CARVEDILOL 6.25 MG PO TABS
6.2500 mg | ORAL_TABLET | Freq: Two times a day (BID) | ORAL | Status: DC
  Administered 2024-04-24 – 2024-04-25 (×2): 6.25 mg via ORAL
  Filled 2024-04-24 (×2): qty 1

## 2024-04-24 NOTE — H&P (Signed)
 Date: 04/24/2024               Patient Name:  Anna Carr MRN: [id number]  DOB: 1941/09/02 Age / Sex: 83 y.o., female   PCP: Thurmond Cathlyn LABOR., MD         Medical Service: Internal Medicine Teaching Service         Attending Physician: Dr. Ronnald Sergeant      First Contact: Remonia Romano, DO}    Second Contact: Dr. Ozell Nearing, DO          Pager Information: First Contact Pager: [phone]   Second Contact Pager: [phone]   SUBJECTIVE   Chief Complaint: generalized weakness, dyspnea, and right lower extremity wound  History of Present Illness: Anna Carr is a 83 y.o. female with PMH of ESRD on HD (TTS), HFpEF, and peripheral artery disease who was transferred from [facility] for generalized weakness, lightheadedness, and some shortness of breath. She was previously admitted here both from 5/8-5/19 and 6/23-6/25 for GI bleed and symptomatic anemia, whose hospital course was complicated by bradycardic arrest in the endoscopy room during her 02/2024 admission. She was discharged on 6/25 after her anemia was corrected and stable after 1u pRBCs. 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 She was discharged with Protonix  40mg  BID, Lasix  80mg  daily, and Carvedilol  6.25 BID and was told to hold aspirin . She followed up with her PCP and was started on Cephalexin 500mg  BID for 10 days for suspected right lower extremity cellulitis.   She then presented to [facility] on Saturday 6/29 after feeling weak throughout her body and lightheadedness. She denies any falls, but states she had a near syncopal episode in which her family members were able to seat her before a fall occurred. She endorsed shortness of breath when attempting to go to the restroom and noticed she was accumulating fluids in her legs despite going to HD. She states her RLE wound  seemed to visually improve on the Cephalexin, and as of today, she has taken 3 doses of it. However, she endorses pain with flexion or extension of her foot, and does not know if that is due to the swelling or infection. At [facility] her RLE was found to have no palpable DP or PT pulses, a delayed capillary refill of her right toes >5seconds, and a bilateral lower extremities were cool-to-touch below her knees. She was transferred back to [facility] on 6/30 for vascular evaluation with possible angiogram after the case was discussed with Dr. Pearline, [facility] (Dr. Shona) accepted transfer.    Outside Hospital Course: Labs significant for: NT pro BNP 111,000, Hgb 9.3, MCV 100, Na 128, Cl 88, Cr 3.60, BUN 33  Imaging:  CXR: cardiomegaly and vascular congestion with bilateral pleural effusions CT Abd Pelv WO Contrast: Small/moderate right-sided pleural effusion with small left pleural effusion. Suspected mild interstitial edema. Small amount of free abdominal fluid, may have arisen from chest. Otherwise negative for acute intra-abdominal pathology. 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  Past Surgical History Past Surgical History:  Procedure Laterality Date   A/V FISTULAGRAM Left 02/17/2021   Procedure: A/V FISTULAGRAM;  Surgeon: Sheree Penne Bruckner, MD;  Location: [facility] INVASIVE CV LAB;  Service: Cardiovascular;  Laterality: Left;   APPENDECTOMY     AV FISTULA PLACEMENT Left    AV FISTULA PLACEMENT Left 03/12/2021   Procedure: INSERTION OF LEFT UPPER ARM ARTERIOVENOUS (AV) GORE-TEX GRAFT;  Surgeon: Sheree Penne Bruckner, MD;  Location: [facility] OR;  Service: Vascular;  Laterality: Left;   BALLOON ENTEROSCOPY  03/02/2024   Procedure: ENTEROSCOPY, USING  BALLOON;  Surgeon: San Sandor GAILS, DO;  Location: [facility] ENDOSCOPY;  Service: Gastroenterology;;   COLONOSCOPY WITH PROPOFOL  N/A 03/18/2021   Procedure: COLONOSCOPY WITH PROPOFOL ;  Surgeon: Legrand Victory LITTIE DOUGLAS, MD;  Location: [facility] ENDOSCOPY;  Service: Gastroenterology;  Laterality: N/A;   CYSTECTOMY     ENTEROSCOPY N/A 03/21/2021   Procedure: ENTEROSCOPY;  Surgeon: Legrand Victory LITTIE DOUGLAS, MD;  Location: [facility] ENDOSCOPY;  Service: Gastroenterology;  Laterality: N/A;   ENTEROSCOPY N/A 12/20/2021   Procedure: ENTEROSCOPY;  Surgeon: Legrand Victory LITTIE DOUGLAS, MD;  Location: [facility] ENDOSCOPY;  Service: Gastroenterology;  Laterality: N/A;   ENTEROSCOPY N/A 02/26/2024   Procedure: ENTEROSCOPY;  Surgeon: Albertus Gordy HERO, MD;  Location: [facility] ENDOSCOPY;  Service: Gastroenterology;  Laterality: N/A;   ENTEROSCOPY N/A 02/28/2024   Procedure: ENTEROSCOPY;  Surgeon: San Sandor GAILS, DO;  Location: [facility] ENDOSCOPY;  Service: Gastroenterology;  Laterality: N/A;   ESOPHAGOGASTRODUODENOSCOPY (EGD) WITH PROPOFOL  N/A 03/18/2021   Procedure: ESOPHAGOGASTRODUODENOSCOPY (EGD) WITH PROPOFOL ;  Surgeon: Legrand Victory LITTIE DOUGLAS, MD;  Location: [facility] ENDOSCOPY;  Service: Gastroenterology;  Laterality: N/A;   GIVENS CAPSULE STUDY N/A 03/19/2021   Procedure: GIVENS CAPSULE STUDY;  Surgeon: Legrand Victory LITTIE DOUGLAS, MD;  Location: [facility] ENDOSCOPY;  Service: Gastroenterology;  Laterality: N/A;   GIVENS CAPSULE STUDY N/A 02/29/2024   Procedure: IMAGING PROCEDURE, GI TRACT, INTRALUMINAL, VIA CAPSULE;  Surgeon: San Sandor GAILS, DO;  Location: [facility] ENDOSCOPY;  Service: Gastroenterology;  Laterality: N/A;   HEMOSTASIS CLIP PLACEMENT  12/20/2021   Procedure: HEMOSTASIS CLIP PLACEMENT;  Surgeon: Legrand Victory LITTIE DOUGLAS, MD;  Location: [facility] ENDOSCOPY;  Service: Gastroenterology;;   HOT HEMOSTASIS N/A 03/21/2021   Procedure: HOT HEMOSTASIS (ARGON PLASMA COAGULATION/BICAP);  Surgeon: Legrand Victory LITTIE DOUGLAS, MD;  Location: [facility] ENDOSCOPY;  Service: Gastroenterology;  Laterality: N/A;   HOT  HEMOSTASIS N/A 12/20/2021   Procedure: HOT HEMOSTASIS (ARGON PLASMA COAGULATION/BICAP);  Surgeon: Legrand Victory LITTIE DOUGLAS, MD;  Location: [facility] ENDOSCOPY;  Service: Gastroenterology;  Laterality: N/A;   HOT HEMOSTASIS N/A 03/02/2024   Procedure: EGD, WITH ARGON PLASMA COAGULATION;  Surgeon: San Sandor GAILS, DO;  Location: [facility] ENDOSCOPY;  Service: Gastroenterology;  Laterality: N/A;   HYSTEROSCOPY     IR THROMBECTOMY AV FISTULA W/THROMBOLYSIS/PTA INC/SHUNT/IMG LEFT Left 04/12/2024   IR US  GUIDE VASC ACCESS LEFT  04/12/2024   LOWER EXTREMITY ANGIOGRAPHY N/A 02/03/2024   Procedure: Lower Extremity Angiography;  Surgeon: Court Dorn PARAS, MD;  Location: [facility] INVASIVE CV LAB;  Service: Cardiovascular;  Laterality: N/A;   POLYPECTOMY  03/18/2021   Procedure: POLYPECTOMY;  Surgeon: Legrand Victory LITTIE DOUGLAS, MD;  Location: [facility] ENDOSCOPY;  Service: Gastroenterology;;    Meds:  Carvedilol  6.25mg  BID Cephalexin 500mg  BID x 10days Furosemide  80mg , daily Pantoprazole  40mg , BID Insulin  Glargine 6u qAM Auryxia  210mg  TID Meloxicam 7.5mg  BID PRN Vitamin B12 1000mcg daily Current Meds  Medication Sig   acetaminophen  (TYLENOL ) 500 MG tablet Take 500 mg by mouth every 6 (six) hours as needed for mild pain (pain score 1-3) or moderate pain (pain score 4-6).   ascorbic acid (VITAMIN C) 1000 MG tablet  Take 1,000 mg by mouth at bedtime.   Biotin 5000 MCG TABS Take 5,000 mcg by mouth daily in the afternoon.   carboxymethylcellulose (REFRESH PLUS) 0.5 % SOLN Place 1 drop into both eyes 3 (three) times daily as needed (dry eyes).   carvedilol  (COREG ) 6.25 MG tablet Take 6.25 mg by mouth daily.   cyanocobalamin (VITAMIN B12) 1000 MCG tablet Take 1,000 mcg by mouth every evening.   ferric citrate  (AURYXIA ) 1 GM 210 MG(Fe) tablet Take 210 mg by mouth 3 (three) times daily with meals.   fluticasone  (FLONASE ) 50 MCG/ACT nasal spray Place 1 spray into both nostrils daily as needed (Congestion).   furosemide  (LASIX ) 80 MG tablet Take 80  mg by mouth daily in the afternoon.   hydrocortisone  1 % ointment Apply 1 Application topically 2 (two) times daily.   ibuprofen (ADVIL) 200 MG tablet Take 200-400 mg by mouth every 6 (six) hours as needed for mild pain (pain score 1-3).   Insulin  Glargine-Lixisenatide  (SOLIQUA ) 100-33 UNT-MCG/ML SOPN Inject 6 Units into the skin in the morning.   Lactobacillus (PROBIOTIC ACIDOPHILUS PO) Take 1 capsule by mouth in the morning.   lidocaine -prilocaine  (EMLA ) cream Apply 1 application topically as needed (prior to fisula access). Tuesday, Thursday, Saturday for dialysis   meloxicam (MOBIC) 7.5 MG tablet Take 7.5-15 mg by mouth daily as needed for pain.   Menthol -Methyl Salicylate  (MUSCLE RUB) 10-15 % CREA Apply 1 Application topically 2 (two) times daily. Apply to left shoulder. (Patient taking differently: Apply 1 Application topically 2 (two) times daily as needed for muscle pain. Apply to left shoulder.)   mupirocin  ointment (BACTROBAN ) 2 % Apply 1 Application topically 2 (two) times daily.   Omega-3 Fatty Acids (FISH OIL PO) Take 1 capsule by mouth in the morning and at bedtime.   pantoprazole  (PROTONIX ) 40 MG tablet Take 1 tablet (40 mg total) by mouth 2 (two) times daily.   polyethylene glycol (MIRALAX  / GLYCOLAX ) 17 g packet Take 17 g by mouth 2 (two) times daily as needed for moderate constipation.   senna (SENOKOT) 8.6 MG TABS tablet Take 1 tablet (8.6 mg total) by mouth daily as needed for mild constipation.   Vitamin E 670 MG (1000 UT) CAPS Take 1,000 Units by mouth in the morning.    Social:  Lives With: son but daughter helps as well  Occupation: retired  Support: family Level of Function: dependent on mostly all ADLs and IADLs PCP:  Thurmond Cathlyn LABOR., MD  Substances: no substance use  Family History:  Family History  Problem Relation Age of Onset   Diabetes Mother    Hypertension Mother    Diabetes Maternal Grandfather    Liver disease Other      Allergies: Allergies as of  04/23/2024 - Review Complete 04/17/2024  Allergen Reaction Noted   Atorvastatin  Other (See Comments) 04/10/2024    Review of Systems: A complete ROS was negative except as per HPI.   OBJECTIVE:   Physical Exam: Blood pressure 106/64, pulse 62, temperature 97.7 F (36.5 C), temperature source Oral, resp. rate 18, height 5' 1 (1.549 m), weight 72.5 kg, SpO2 99%.  Constitutional: well-appearing, lying in bed, in no acute distress HENT: normocephalic atraumatic, mucous membranes moist Eyes: conjunctiva non-erythematous Neck: supple Cardiovascular: regular rate and rhythm, bilateral 1-2+ lower extremity edema, weak left DP pulse, no palpable right DP/PT pulses on doppler results  Pulmonary/Chest: normal work of breathing on 2L [place], diminished lung sounds on bilateral bases Abdominal: soft, non-tender, distended  Neurological: alert & oriented x 3, 5/5 strength in bilateral upper and lower extremities Skin: right-sided lower extremity wounds and pustules with signs of cellulitis infection (healing), cool sensation to touch below the knee bilaterally, right-sided heel ulcer (covered)  Psych: normal mood and behavior  Labs: CBC    Component Value Date/Time   WBC 6.6 04/24/2024 2145   RBC 2.76 (L) 04/24/2024 2145   HGB 8.7 (L) 04/24/2024 2145   HGB 11.3 01/12/2024 1259   HCT 28.4 (L) 04/24/2024 2145   HCT 35.0 01/12/2024 1259   PLT 179 04/24/2024 2145   PLT 208 01/12/2024 1259   MCV 102.9 (H) 04/24/2024 2145   MCV 99 (H) 01/12/2024 1259   MCH 31.5 04/24/2024 2145   MCHC 30.6 04/24/2024 2145   RDW 19.5 (H) 04/24/2024 2145   RDW 15.7 (H) 01/12/2024 1259   LYMPHSABS 0.9 04/24/2024 2145   LYMPHSABS 0.9 01/12/2024 1259   MONOABS 0.4 04/24/2024 2145   EOSABS 0.1 04/24/2024 2145   EOSABS 0.1 01/12/2024 1259   BASOSABS 0.1 04/24/2024 2145   BASOSABS 0.0 01/12/2024 1259     CMP     Component Value Date/Time   NA 132 (L) 04/24/2024 2145   NA 134 01/12/2024 1259   K 4.9 04/24/2024  2145   CL 91 (L) 04/24/2024 2145   CO2 25 04/24/2024 2145   GLUCOSE 202 (H) 04/24/2024 2145   BUN 43 (H) 04/24/2024 2145   BUN 31 (H) 01/12/2024 1259   CREATININE 5.60 (H) 04/24/2024 2145   CALCIUM  8.8 (L) 04/24/2024 2145   PROT 5.5 (L) 04/17/2024 1631   ALBUMIN  2.7 (L) 04/24/2024 2145   AST 28 04/17/2024 1631   ALT 21 04/17/2024 1631   ALKPHOS 117 04/17/2024 1631   BILITOT 1.0 04/17/2024 1631   GFRNONAA 7 (L) 04/24/2024 2145    Imaging: No results found.    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 person living with a history of ESRD on HD, HFpEF, T2DM, and HTN who presented with concerns for weakness, lightheadedness, and diminished vascular flow to the bilateral lower extremities. She was transferred and admitted for further evaluation and management on hospital day 0.  Right Lower Extremity Critical Limb Ischemia RLE Wound Patient's DP/PT pulses on the right lower extremity were not palpable at [facility] ED. She was subsequently tranferred here for vascular surgery evaluation of the lower extremities. Doppler was done upon admission to assess for arterial flow. We appreciated flow on bilateral popliteal pulses and left posterior tibial pulse. Right sided DP and PT pulses and left sided DP pulse were not found. Bilateral lower extremities are cool to the touch. Vascular has been consulted to assess for potential arteriogram. Vascular will further decide date for surgery. Dr. Lanis is aware of the patient and plans to see in the morning. - NPO after midnight    - Wound care and PT/OT eval - hold anticoagulation due to recent GI bleed, SQ heparin  for DVT ppx and resume aspirin  81 mg daily  Presyncope Patient endorses generalized body weakness and lightheadedness since her discharge on 6/25. Denies loss of consciousness or chest pain but states that she has  trouble concentrating. Her lightheaded episodes are most noticeable when she stands from a seated position, and reports she nearly fell a few days ago but was caught and lowered to a seat by her family  members. Could be related to some fluid overload with signs of peripheral edema and high NT-proBNP. EKG at [facility] showed sinus rhythm with a marked sinus arrthymia. Will get EKG to reassess and evaluate for any new findings.  - EKG  -  orthostatics with PT if she is able to tolerate  Duodenal AVMs w/ recent GI bleed Patient presented to [facility] ED with generalized weakness and lightheadedness. Due to recent GI bleed and need for blood transfusion we were concerned her symptoms could be due to repeat bleed. However no hematochezia, tachycardia, or hypotension. Hgb on admission to [facility] was 9.3 and 8.7 here. She has been taking Protonix  40mg  BID since her most recent discharge. Aspirin  held on recent discharge.  - Monitor H/H and please page for any melena or hematochezia  ESRD on HD (TTS) Last HD 6/28 - Consult nephrology for HD in AM - CBC and renal function panel   HFpEF Patient has some volume overload. Went to hemodialysis on Saturday. Has been taking Lasix  80mg  since discharge on 6/25. She is due for HD tomorrow. On 2 L [place] on arrival to [facility] however now on room air. - Resume Lasix  80mg  daily  - Monitor I&Os  T2DM Home medications include Soliqua  6u each morning. Will be NPO at midnight until vascular surgery evaluates appropriate surgery date, so will hold long-acting insulin . Last A1c 5.8 on 5/21. - Start sliding scale insulin   Hypertension  Blood pressure stable 106/64. Takes home carvedilol  6.25mg .  - resumed carvedilol  6.25 mg BID  Best practice: Diet: NPO VTE: Heparin  Code: Full  Disposition planning: Prior to Admission Living Arrangement: Home, living with son, receiving help from daughter Anticipated Discharge Location: home vs rehab Barriers to Discharge: further  workup   Dispo: Admit patient to Observation with expected length of stay less than 2 midnights.  Signed: Keena Heesch, DO Internal Medicine Resident  04/24/2024, 11:33 PM  Please contact IM Residency On-Call Pager at: [phone] or [phone].

## 2024-04-24 NOTE — Progress Notes (Signed)
 Admitting notified of patient's arrival at 1600. Paged MD of patient's arrival.

## 2024-04-25 ENCOUNTER — Inpatient Hospital Stay (HOSPITAL_COMMUNITY)

## 2024-04-25 DIAGNOSIS — N186 End stage renal disease: Secondary | ICD-10-CM

## 2024-04-25 DIAGNOSIS — Z992 Dependence on renal dialysis: Secondary | ICD-10-CM

## 2024-04-25 DIAGNOSIS — D500 Iron deficiency anemia secondary to blood loss (chronic): Secondary | ICD-10-CM

## 2024-04-25 DIAGNOSIS — Z794 Long term (current) use of insulin: Secondary | ICD-10-CM

## 2024-04-25 DIAGNOSIS — S91301A Unspecified open wound, right foot, initial encounter: Secondary | ICD-10-CM

## 2024-04-25 DIAGNOSIS — I509 Heart failure, unspecified: Secondary | ICD-10-CM

## 2024-04-25 DIAGNOSIS — M62262 Nontraumatic ischemic infarction of muscle, left lower leg: Secondary | ICD-10-CM

## 2024-04-25 DIAGNOSIS — D62 Acute posthemorrhagic anemia: Secondary | ICD-10-CM

## 2024-04-25 DIAGNOSIS — I70221 Atherosclerosis of native arteries of extremities with rest pain, right leg: Principal | ICD-10-CM

## 2024-04-25 DIAGNOSIS — I998 Other disorder of circulatory system: Secondary | ICD-10-CM

## 2024-04-25 DIAGNOSIS — E1151 Type 2 diabetes mellitus with diabetic peripheral angiopathy without gangrene: Secondary | ICD-10-CM

## 2024-04-25 DIAGNOSIS — I10 Essential (primary) hypertension: Secondary | ICD-10-CM

## 2024-04-25 DIAGNOSIS — I5030 Unspecified diastolic (congestive) heart failure: Secondary | ICD-10-CM

## 2024-04-25 DIAGNOSIS — E1122 Type 2 diabetes mellitus with diabetic chronic kidney disease: Secondary | ICD-10-CM

## 2024-04-25 DIAGNOSIS — L03115 Cellulitis of right lower limb: Secondary | ICD-10-CM

## 2024-04-25 DIAGNOSIS — K922 Gastrointestinal hemorrhage, unspecified: Secondary | ICD-10-CM

## 2024-04-25 DIAGNOSIS — I132 Hypertensive heart and chronic kidney disease with heart failure and with stage 5 chronic kidney disease, or end stage renal disease: Secondary | ICD-10-CM

## 2024-04-25 LAB — RENAL FUNCTION PANEL
Albumin: 2.7 g/dL — ABNORMAL LOW (ref 3.5–5.0)
Anion gap: 17 — ABNORMAL HIGH (ref 5–15)
BUN: 48 mg/dL — ABNORMAL HIGH (ref 8–23)
CO2: 24 mmol/L (ref 22–32)
Calcium: 8.8 mg/dL — ABNORMAL LOW (ref 8.9–10.3)
Chloride: 90 mmol/L — ABNORMAL LOW (ref 98–111)
Creatinine, Ser: 6.03 mg/dL — ABNORMAL HIGH (ref 0.44–1.00)
GFR, Estimated: 7 mL/min — ABNORMAL LOW (ref >60)
Glucose, Bld: 88 mg/dL (ref 70–99)
Phosphorus: 7.1 mg/dL — ABNORMAL HIGH (ref 2.5–4.6)
Potassium: 5 mmol/L (ref 3.5–5.1)
Sodium: 131 mmol/L — ABNORMAL LOW (ref 135–145)

## 2024-04-25 LAB — CBC
HCT: 29.3 % — ABNORMAL LOW (ref 36.0–46.0)
Hemoglobin: 9.2 g/dL — ABNORMAL LOW (ref 12.0–15.0)
MCH: 31.9 pg (ref 26.0–34.0)
MCHC: 31.4 g/dL (ref 30.0–36.0)
MCV: 101.7 fL — ABNORMAL HIGH (ref 80.0–100.0)
Platelets: 187 10*3/uL (ref 150–400)
RBC: 2.88 MIL/uL — ABNORMAL LOW (ref 3.87–5.11)
RDW: 19.1 % — ABNORMAL HIGH (ref 11.5–15.5)
WBC: 6.5 10*3/uL (ref 4.0–10.5)
nRBC: 0 % (ref 0.0–0.2)

## 2024-04-25 LAB — GLUCOSE, CAPILLARY
Glucose-Capillary: 90 mg/dL (ref 70–99)
Glucose-Capillary: 163 mg/dL — ABNORMAL HIGH (ref 70–99)
Glucose-Capillary: 135 mg/dL — ABNORMAL HIGH (ref 70–99)

## 2024-04-25 MED ORDER — CHLORHEXIDINE GLUCONATE CLOTH 2 % EX PADS
6.0000 | MEDICATED_PAD | Freq: Every day | CUTANEOUS | Status: DC
  Administered 2024-04-26 – 2024-04-28 (×3): 6 via TOPICAL

## 2024-04-25 MED ORDER — CEPHALEXIN 500 MG PO CAPS
500.0000 mg | ORAL_CAPSULE | Freq: Two times a day (BID) | ORAL | Status: DC
  Administered 2024-04-26 – 2024-04-29 (×7): 500 mg via ORAL
  Filled 2024-04-25 (×7): qty 1

## 2024-04-25 MED ORDER — FERRIC CITRATE 1 GM 210 MG(FE) PO TABS
210.0000 mg | ORAL_TABLET | Freq: Three times a day (TID) | ORAL | Status: DC
  Administered 2024-04-25 – 2024-04-29 (×10): 210 mg via ORAL
  Filled 2024-04-25 (×10): qty 1

## 2024-04-25 MED ORDER — CLOPIDOGREL BISULFATE 75 MG PO TABS
75.0000 mg | ORAL_TABLET | Freq: Every day | ORAL | Status: DC
  Administered 2024-04-25 – 2024-04-29 (×4): 75 mg via ORAL
  Filled 2024-04-25 (×4): qty 1

## 2024-04-25 NOTE — Consult Note (Signed)
 [facility] Renal Consultation Note    Indication for Consultation:  Management of ESRD/hemodialysis, anemia, hypertension/volume, and secondary hyperparathyroidism. PCP:  HPI: Anna Carr is a 83 y.o. female with ESRD, HTN, HFpEF, Hx recent GIB, PAD, COPD, T2DM who was transferred from [facility] to [facility] this morning with ischemic R foot and dyspnea.  Recently discharged from [facility] after 6/23-6/25/25 admit with GIB, symptomatic anemia and RLE cellulitis. She was transfused and started on PO BID. Initially did ok at home. S/p outpatient HD 6/26 and 6/28 without issues. On Sunday 6/29, began to feel dyspneic and weak. They were concerned that she was anemic again and presented to [facility] ED. There, noted to have pulseless, cool R foot which prompted transfer to [facility] for vascular surgery evaluation.  Here, vitals are ok. Afebrile and not hypoxic. Labs with Na 131, K 5, CO2 24, BUN 48, Ca 8.8, Phos 7.1, Alb 2.7, WBC 6.5, Hgb 9.2, Plts 187. Vascular surgery consult pending.  Seen in room, daughter at bedside. They report worsened dyspnea and want to make sure that she will be dialyzed today. No CP, abd pain, N/V/D, no visible GI blood loss. R foot is painful.  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 difficile colitis  03/30/2021   Cardiac arrest ([name]) 03/02/2024   Cellulitis 03/16/2021   CHF with left ventricular diastolic dysfunction, NYHA class 1 ([name]) 02/02/2017   Cholelithiasis 03/30/2021   Chronic constipation 12/22/2015   Chronic laryngitis 02/17/2017   Last Assessment & Plan:   Formatting of this note might be different from the original.  Evaluation of larynx.  Was hospitalized for pneumonia and during that time she started noticing worsening hoarseness.  Denies any throat pain.  No obvious heartburn.  She did recently start PPI therapy.  She smoked in the past.  Denies any difficulty swallowing.  Hoarseness is improving.  EXAM shows mildly ras   Coagulation defect, unspecified ([name]) 12/07/2019   Colon polyp    Complication of vascular dialysis catheter 03/05/2020   Contact with and (suspected) exposure to covid-19 10/28/2020   COPD mixed type ([name])    Cough, unspecified 10/28/2020   COVID-19 10/29/2020   Depression    Diverticulosis of large intestine without perforation or abscess without bleeding 01/08/2020   Dyspnea on exertion    Dyspnea, unspecified 11/02/2019   Elevated troponin 03/16/2021   Encounter for removal of sutures 12/28/2019   Encounter for screening for respiratory tuberculosis 12/06/2019   ESRD (end stage renal disease) ([name]) 07/18/2019   ESRD on dialysis ([name]) 09/18/2019   ESRD on hemodialysis ([name]) 01/02/2020   Fever, unspecified 01/23/2020   GI bleed 12/19/2021   GIB (gastrointestinal bleeding) 01/02/2020   Heme positive stool    High risk medication use 12/22/2015   History of blood transfusion    History of colonic polyps 01/08/2020  HLD (hyperlipidemia) 12/22/2015   Hypertension    Hypervolemia 11/13/2019   Hypokalemia 03/11/2019   Hyponatremia 03/11/2019   Hypovolemia 10/27/2021   Hypoxia 03/11/2019   Insomnia    Insulin  long-term use ([name]) 12/22/2015   Iron  deficiency anemia, unspecified 12/06/2019   Major depressive disorder, recurrent ([name]) 12/22/2015    Malaise and fatigue 12/23/2015   Mild protein-calorie malnutrition ([name]) 03/26/2020   Mitral regurgitation moderate to severe based on echocardiogram from 2022 11/03/2021   Musculoskeletal chest pain 03/03/2024   Osteoarthritis    Osteopenia    Other disorders of phosphorus metabolism 01/08/2020   PAC (premature atrial contraction) 10/04/2020   Pain, unspecified 12/06/2019   PAT (paroxysmal atrial tachycardia) ([name]) 10/04/2020   Peripheral vascular disease, unspecified ([name]) critical stenosis lower extremity 12/01/2023   Peripheral arterial disease     Pneumonia    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  Chodri CT annually.     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 dialysis, with long-term current use of insulin  ([name]) 12/23/2015   Type 2 diabetes mellitus, with long-term current use of insulin  ([name]) 03/11/2019   Unspecified hemorrhoids 01/08/2020   Past Surgical History:  Procedure Laterality Date   A/V FISTULAGRAM Left 02/17/2021   Procedure: A/V FISTULAGRAM;  Surgeon: Sheree Penne Bruckner, MD;  Location: [facility] INVASIVE CV LAB;  Service: Cardiovascular;  Laterality: Left;   APPENDECTOMY     AV FISTULA PLACEMENT Left    AV FISTULA PLACEMENT Left 03/12/2021   Procedure: INSERTION OF LEFT UPPER ARM ARTERIOVENOUS (AV) GORE-TEX GRAFT;  Surgeon: Sheree Penne Bruckner, MD;  Location: [facility] OR;  Service: Vascular;  Laterality: Left;   BALLOON ENTEROSCOPY  03/02/2024   Procedure: ENTEROSCOPY, USING BALLOON;  Surgeon: San Sandor GAILS, DO;  Location: [facility] ENDOSCOPY;  Service: Gastroenterology;;   COLONOSCOPY WITH PROPOFOL  N/A 03/18/2021   Procedure:  COLONOSCOPY WITH PROPOFOL ;  Surgeon: Legrand Victory LITTIE DOUGLAS, MD;  Location: [facility] ENDOSCOPY;  Service: Gastroenterology;  Laterality: N/A;   CYSTECTOMY     ENTEROSCOPY N/A 03/21/2021   Procedure: ENTEROSCOPY;  Surgeon: Legrand Victory LITTIE DOUGLAS, MD;  Location: [facility] ENDOSCOPY;  Service: Gastroenterology;  Laterality: N/A;   ENTEROSCOPY N/A 12/20/2021   Procedure: ENTEROSCOPY;  Surgeon: Legrand Victory LITTIE DOUGLAS, MD;  Location: [facility] ENDOSCOPY;  Service: Gastroenterology;  Laterality: N/A;   ENTEROSCOPY N/A 02/26/2024   Procedure: ENTEROSCOPY;  Surgeon: Albertus Gordy HERO, MD;  Location: [facility] ENDOSCOPY;  Service: Gastroenterology;  Laterality: N/A;   ENTEROSCOPY N/A 02/28/2024   Procedure: ENTEROSCOPY;  Surgeon: San Sandor GAILS, DO;  Location: [facility] ENDOSCOPY;  Service: Gastroenterology;  Laterality: N/A;   ESOPHAGOGASTRODUODENOSCOPY (EGD) WITH PROPOFOL  N/A 03/18/2021   Procedure: ESOPHAGOGASTRODUODENOSCOPY (EGD) WITH PROPOFOL ;  Surgeon: Legrand Victory LITTIE DOUGLAS, MD;  Location: [facility] ENDOSCOPY;  Service: Gastroenterology;  Laterality: N/A;   GIVENS CAPSULE STUDY N/A 03/19/2021   Procedure: GIVENS CAPSULE STUDY;  Surgeon: Legrand Victory LITTIE DOUGLAS, MD;  Location: [facility] ENDOSCOPY;  Service: Gastroenterology;  Laterality: N/A;   GIVENS CAPSULE STUDY N/A 02/29/2024   Procedure: IMAGING PROCEDURE, GI TRACT, INTRALUMINAL, VIA CAPSULE;  Surgeon: San Sandor GAILS, DO;  Location: [facility] ENDOSCOPY;  Service: Gastroenterology;  Laterality: N/A;   HEMOSTASIS CLIP PLACEMENT  12/20/2021   Procedure: HEMOSTASIS CLIP PLACEMENT;  Surgeon: Legrand Victory LITTIE DOUGLAS, MD;  Location: [facility] ENDOSCOPY;  Service: Gastroenterology;;   HOT HEMOSTASIS N/A 03/21/2021   Procedure: HOT HEMOSTASIS (ARGON PLASMA COAGULATION/BICAP);  Surgeon: Legrand Victory LITTIE DOUGLAS, MD;  Location: [facility] ENDOSCOPY;  Service: Gastroenterology;  Laterality: N/A;   HOT HEMOSTASIS N/A 12/20/2021   Procedure: HOT HEMOSTASIS (ARGON PLASMA COAGULATION/BICAP);  Surgeon: Legrand Victory LITTIE DOUGLAS, MD;  Location: [facility] ENDOSCOPY;  Service:  Gastroenterology;  Laterality: N/A;   HOT HEMOSTASIS N/A 03/02/2024   Procedure: EGD, WITH ARGON PLASMA COAGULATION;  Surgeon: San Sandor GAILS, DO;  Location: [facility] ENDOSCOPY;  Service: Gastroenterology;  Laterality: N/A;   HYSTEROSCOPY     IR THROMBECTOMY AV FISTULA W/THROMBOLYSIS/PTA INC/SHUNT/IMG LEFT Left 04/12/2024   IR US  GUIDE VASC ACCESS LEFT  04/12/2024   LOWER EXTREMITY ANGIOGRAPHY N/A 02/03/2024   Procedure: Lower Extremity Angiography;  Surgeon: Court Dorn PARAS, MD;  Location: [facility] INVASIVE CV LAB;  Service: Cardiovascular;  Laterality: N/A;   POLYPECTOMY  03/18/2021   Procedure: POLYPECTOMY;  Surgeon: Legrand Victory LITTIE DOUGLAS, MD;  Location: [facility] ENDOSCOPY;  Service: Gastroenterology;;   Family History  Problem Relation Age of Onset   Diabetes Mother    Hypertension Mother    Diabetes Maternal Grandfather    Liver disease Other    Social History:  reports that she quit smoking about 42 years ago. Her smoking use included cigarettes. She has never used smokeless tobacco. She reports that she does not currently use alcohol . She reports that she does not use drugs.  ROS: As per HPI otherwise negative.  Physical Exam: Vitals:   04/25/24 0013 04/25/24 0432 04/25/24 0748 04/25/24 1155  BP: (!) 116/49 109/66 (!) 111/44 (!) 101/53  Pulse: 69 71 67 65  Resp: 18 18 18 18   Temp: (!) 97.5 F (36.4 C) (!) 97.5 F (36.4 C) 97.7 F (36.5 C)   TempSrc: Oral Oral    SpO2: 93% 90%  97%  Weight:  76.1 kg    Height:         General: Well developed, well nourished, in no acute distress. Head: Normocephalic, atraumatic, sclera non-icteric, mucus membranes are moist. Neck: Supple without lymphadenopathy/masses. JVD not elevated. Lungs: L base rales, dull R base. Clear in upper lobes Heart: RRR with normal S1, S2. No murmurs, rubs, or gallops appreciated. Abdomen: Soft, non-tender, non-distended with normoactive bowel sounds. No rebound/guarding. Musculoskeletal:  Strength and tone appear normal  for age. Lower extremities: 2+ BLE edema; R foot cooler than L. Scattered scabbed wounds. Neuro: Alert and oriented X 3. Moves all extremities spontaneously. Psych:  Responds to questions appropriately with a normal affect. Dialysis Access: LUE AVG +t/b  Allergies  Allergen Reactions   Atorvastatin  Other (See Comments)    Constipation   Wound Dressing Adhesive Rash   Prior to Admission medications   Medication Sig Start Date End Date Taking? Authorizing Provider  acetaminophen  (TYLENOL ) 500 MG tablet Take 500 mg by mouth every 6 (six) hours as needed for mild pain (pain score 1-3) or moderate pain (pain score 4-6).   Yes [provider]  ascorbic acid (VITAMIN C) 1000 MG tablet Take 1,000 mg by mouth at bedtime. 12/07/19  Yes [provider]  Biotin 5000 MCG TABS Take 5,000 mcg by mouth daily in the afternoon.   Yes [provider]  carboxymethylcellulose (REFRESH PLUS) 0.5 % SOLN Place 1 drop into both eyes 3 (three) times daily as needed (dry eyes).   Yes [provider]  carvedilol  (COREG ) 6.25 MG  tablet Take 6.25 mg by mouth daily. 12/07/19  Yes [provider]  cyanocobalamin (VITAMIN B12) 1000 MCG tablet Take 1,000 mcg by mouth every evening.   Yes [provider]  ferric citrate  (AURYXIA ) 1 GM 210 MG(Fe) tablet Take 210 mg by mouth 3 (three) times daily with meals.   Yes [provider]  fluticasone  (FLONASE ) 50 MCG/ACT nasal spray Place 1 spray into both nostrils daily as needed (Congestion).   Yes [provider]  furosemide  (LASIX ) 80 MG tablet Take 80 mg by mouth daily in the afternoon. 04/03/21  Yes [provider]  hydrocortisone  1 % ointment Apply 1 Application topically 2 (two) times daily. 04/14/24  Yes Amoako, Prince, MD  ibuprofen (ADVIL) 200 MG tablet Take 200-400 mg by mouth every 6 (six) hours as needed for mild pain (pain score 1-3).   Yes [provider]  Insulin  Glargine-Lixisenatide   (SOLIQUA ) 100-33 UNT-MCG/ML SOPN Inject 6 Units into the skin in the morning. 03/11/24  Yes Zheng, Michael, DO  Lactobacillus (PROBIOTIC ACIDOPHILUS PO) Take 1 capsule by mouth in the morning.   Yes [provider]  lidocaine -prilocaine  (EMLA ) cream Apply 1 application topically as needed (prior to fisula access). Tuesday, Thursday, Saturday for dialysis 07/04/20  Yes [provider]  meloxicam (MOBIC) 7.5 MG tablet Take 7.5-15 mg by mouth daily as needed for pain. 04/05/24  Yes [provider]  Menthol -Methyl Salicylate  (MUSCLE RUB) 10-15 % CREA Apply 1 Application topically 2 (two) times daily. Apply to left shoulder. Patient taking differently: Apply 1 Application topically 2 (two) times daily as needed for muscle pain. Apply to left shoulder. 03/11/24  Yes Zheng, Michael, DO  mupirocin  ointment (BACTROBAN ) 2 % Apply 1 Application topically 2 (two) times daily. 04/21/24 05/05/24 Yes [provider]  Omega-3 Fatty Acids (FISH OIL PO) Take 1 capsule by mouth in the morning and at bedtime.   Yes [provider]  pantoprazole  (PROTONIX ) 40 MG tablet Take 1 tablet (40 mg total) by mouth 2 (two) times daily. 03/11/24  Yes Zheng, Michael, DO  polyethylene glycol (MIRALAX  / GLYCOLAX ) 17 g packet Take 17 g by mouth 2 (two) times daily as needed for moderate constipation.   Yes [provider]  senna (SENOKOT) 8.6 MG TABS tablet Take 1 tablet (8.6 mg total) by mouth daily as needed for mild constipation. 04/14/24  Yes Amoako, Prince, MD  Vitamin E 670 MG (1000 UT) CAPS Take 1,000 Units by mouth in the morning. 01/09/20  Yes [provider]  aspirin  EC 81 MG tablet Take 1 tablet (81 mg total) by mouth daily. Swallow whole. Patient not taking: Reported on 04/24/2024 12/01/23   Krasowski, Robert J, MD  lidocaine  (LIDODERM ) 5 % Place 1 patch onto the skin daily. Remove & Discard patch within 12 hours or as directed by MD Patient not taking: Reported on 04/10/2024  03/11/24   Zheng, Michael, DO   Current Facility-Administered Medications  Medication Dose Route Frequency Provider Last Rate Last Admin   acetaminophen  (TYLENOL ) tablet 1,000 mg  1,000 mg Oral Q8H PRN Jolaine Pac, DO   1,000 mg at 04/25/24 1140   artificial tears ophthalmic solution 1 drop  1 drop Both Eyes TID PRN Jolaine Pac, DO       aspirin  EC tablet 81 mg  81 mg Oral Daily Jolaine Pac, DO   81 mg at 04/25/24 1140   carvedilol  (COREG ) tablet 6.25 mg  6.25 mg Oral BID Jolaine Pac, DO   6.25 mg  at 04/25/24 1140   [START ON 04/26/2024] Chlorhexidine  Gluconate Cloth 2 % PADS 6 each  6 each Topical Q0600 Isaias Dowson R, PA-C       cyanocobalamin (VITAMIN B12) tablet 1,000 mcg  1,000 mcg Oral QPM Jolaine Pac, DO   1,000 mcg at 04/24/24 2339   fluticasone  (FLONASE ) 50 MCG/ACT nasal spray 1 spray  1 spray Each Nare Daily PRN Jolaine Pac, DO       furosemide  (LASIX ) tablet 80 mg  80 mg Oral Q1500 Jolaine Pac, DO   80 mg at 04/24/24 2339   heparin  injection 5,000 Units  5,000 Units Subcutaneous Q8H Jolaine Pac, DO   5,000 Units at 04/25/24 9485   hydrocortisone  1 % ointment 1 Application  1 Application Topical BID Jolaine Pac, DO   1 Application at 04/25/24 1142   insulin  aspart (novoLOG ) injection 0-9 Units  0-9 Units Subcutaneous TID WC Jolaine Pac, DO       Muscle Rub CREA 1 Application  1 Application Topical BID PRN Jolaine Pac, DO       pantoprazole  (PROTONIX ) EC tablet 40 mg  40 mg Oral BID Jolaine Pac, DO   40 mg at 04/25/24 1140   polyethylene glycol (MIRALAX  / GLYCOLAX ) packet 17 g  17 g Oral BID PRN Jolaine Pac, DO       senna (SENOKOT) tablet 8.6 mg  1 tablet Oral Daily PRN Jolaine Pac, DO       Vitamin E CAPS 400 Units  400 Units Oral q AM Jolaine Pac, DO   400 Units at 04/25/24 1140   Labs: Basic Metabolic Panel: Recent Labs  Lab 04/19/24 0447 04/24/24 2145 04/25/24 0900  NA 129* 132* 131*  K 4.3 4.9 5.0  CL 92* 91*  90*  CO2 25 25 24   GLUCOSE 177* 202* 88  BUN 33* 43* 48*  CREATININE 3.84* 5.60* 6.03*  CALCIUM  8.9 8.8* 8.8*  PHOS 4.2 6.7* 7.1*   Liver Function Tests: Recent Labs  Lab 04/19/24 0447 04/24/24 2145 04/25/24 0900  ALBUMIN  2.4* 2.7* 2.7*   CBC: Recent Labs  Lab 04/19/24 0447 04/24/24 2145 04/25/24 0900  WBC 6.9 6.6 6.5  NEUTROABS  --  5.1  --   HGB 8.4* 8.7* 9.2*  HCT 26.6* 28.4* 29.3*  MCV 100.0 102.9* 101.7*  PLT 204 179 187   CBG: Recent Labs  Lab 04/18/24 2035 04/19/24 0727 04/19/24 1112 04/24/24 2155 04/25/24 0750  GLUCAP 256* 177* 259* 176* 90   Dialysis Orders:  TTS - [place] 3:30hr, 400/A1.5, EDW 71kg, 2K2/5Ca bath, UFP #2, AVG. no heparin   Assessment/Plan:  Ischemic R foot: VVS consulting, plan pending  ESRD: Continue HD on usual TTS schedule - for HD today.   Hypertension/volume: BP ok, but with edema. ^ UFG as tolerated.  Anemia, recent GIB: Hgb 9.2 - better than prior.   Metabolic bone disease: Ca ok, Phso high - resume home binders.  T2DM  HFpEF  Izetta Boehringer, PA-C 04/25/2024, 1:02 PM  [employer]

## 2024-04-25 NOTE — Consult Note (Signed)
 Hospital Consult    Reason for Consult: Right lower extremity wounds with nonpalpable pulse Requesting Physician: Hospital medicine MRN #:  [id number]  History of Present Illness: This is a 83 y.o. female who presented to [facility] as a transfer from [facility] with malaise, wounds to the right foot, nonpalpable pulse.  On exam, Anna Carr was resting comfortably, with her daughter by her side.  The last several months Anna Carr have been very challenging.  Anna Carr has had a chronic GI bleed for which Anna Carr underwent endoscopy.  During endoscopy, Anna Carr coded and required CPR.  Over the last month Anna Carr has also been hospitalized with pulmonary infection, pleural effusion which required drainage.  Anemia from GI bleed has required blood product administration.  From a lower extremity standpoint, Anna Carr is followed by my colleague, Dr. Court, and underwent angiography in April.  This demonstrated very little if not any revascularization options in the right lower extremity.  Since her last hospitalization, where Anna Carr became very fluid overloaded, Anna Carr had small wounds develop on the right foot.  These been present for roughly 2 weeks.  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 difficile colitis  03/30/2021   Cardiac arrest ([name]) 03/02/2024   Cellulitis 03/16/2021   CHF with left ventricular diastolic dysfunction, NYHA class 1 ([name]) 02/02/2017   Cholelithiasis 03/30/2021   Chronic constipation 12/22/2015   Chronic laryngitis 02/17/2017   Last Assessment & Plan:   Formatting of this note might be different from the original.  Evaluation of larynx.  Was hospitalized for pneumonia and during that time Anna Carr started noticing worsening hoarseness.  Denies any throat pain.  No obvious heartburn.  Anna Carr did recently start PPI therapy.  Anna Carr smoked in the past.  Denies any difficulty swallowing.  Hoarseness is improving.  EXAM shows mildly ras   Coagulation defect, unspecified ([name]) 12/07/2019   Colon polyp    Complication of vascular dialysis catheter 03/05/2020   Contact with and (suspected) exposure to covid-19 10/28/2020   COPD mixed type ([name])    Cough, unspecified 10/28/2020   COVID-19 10/29/2020   Depression    Diverticulosis of large intestine without perforation or abscess without bleeding 01/08/2020   Dyspnea on exertion    Dyspnea, unspecified 11/02/2019   Elevated troponin 03/16/2021   Encounter for removal of sutures 12/28/2019   Encounter for screening for respiratory tuberculosis 12/06/2019   ESRD (end stage renal disease) ([name]) 07/18/2019   ESRD on dialysis ([name]) 09/18/2019   ESRD on hemodialysis ([name]) 01/02/2020   Fever, unspecified 01/23/2020   GI bleed 12/19/2021   GIB (gastrointestinal bleeding) 01/02/2020   Heme positive stool    High risk medication use 12/22/2015   History of blood transfusion    History of colonic polyps 01/08/2020   HLD (hyperlipidemia) 12/22/2015   Hypertension  Hypervolemia 11/13/2019   Hypokalemia 03/11/2019   Hyponatremia 03/11/2019   Hypovolemia 10/27/2021   Hypoxia 03/11/2019   Insomnia    Insulin  long-term use ([name]) 12/22/2015   Iron  deficiency anemia, unspecified 12/06/2019   Major depressive disorder, recurrent ([name]) 12/22/2015    Malaise and fatigue 12/23/2015   Mild protein-calorie malnutrition ([name]) 03/26/2020   Mitral regurgitation moderate to severe based on echocardiogram from 2022 11/03/2021   Musculoskeletal chest pain 03/03/2024   Osteoarthritis    Osteopenia    Other disorders of phosphorus metabolism 01/08/2020   PAC (premature atrial contraction) 10/04/2020   Pain, unspecified 12/06/2019   PAT (paroxysmal atrial tachycardia) ([name]) 10/04/2020   Peripheral vascular disease, unspecified ([name]) critical stenosis lower extremity 12/01/2023   Peripheral arterial disease     Pneumonia    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  Chodri CT annually.     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 dialysis, with long-term current use of insulin  ([name]) 12/23/2015   Type 2 diabetes mellitus, with long-term current use of insulin  ([name]) 03/11/2019   Unspecified hemorrhoids 01/08/2020    Past Surgical History:  Procedure Laterality Date   A/V FISTULAGRAM Left 02/17/2021   Procedure: A/V FISTULAGRAM;  Surgeon: Sheree Penne Bruckner, MD;  Location[facility] INVASIVE CV LAB;  Service: Cardiovascular;  Laterality: Left;   APPENDECTOMY     AV FISTULA PLACEMENT Left    AV FISTULA PLACEMENT Left 03/12/2021   Procedure: INSERTION OF LEFT UPPER ARM ARTERIOVENOUS (AV) GORE-TEX GRAFT;  Surgeon: Sheree Penne Bruckner, MD;  Location: [facility] OR;  Service: Vascular;  Laterality: Left;   BALLOON ENTEROSCOPY  03/02/2024   Procedure: ENTEROSCOPY, USING BALLOON;  Surgeon: San Sandor GAILS, DO;  Location: [facility] ENDOSCOPY;  Service: Gastroenterology;;   COLONOSCOPY WITH PROPOFOL  N/A 03/18/2021   Procedure:  COLONOSCOPY WITH PROPOFOL ;  Surgeon: Legrand Victory LITTIE DOUGLAS, MD;  Location: [facility] ENDOSCOPY;  Service: Gastroenterology;  Laterality: N/A;   CYSTECTOMY     ENTEROSCOPY N/A 03/21/2021   Procedure: ENTEROSCOPY;  Surgeon: Legrand Victory LITTIE DOUGLAS, MD;  Location: [facility] ENDOSCOPY;  Service: Gastroenterology;  Laterality: N/A;   ENTEROSCOPY N/A 12/20/2021   Procedure: ENTEROSCOPY;  Surgeon: Legrand Victory LITTIE DOUGLAS, MD;  Location: [facility] ENDOSCOPY;  Service: Gastroenterology;  Laterality: N/A;   ENTEROSCOPY N/A 02/26/2024   Procedure: ENTEROSCOPY;  Surgeon: Albertus Gordy HERO, MD;  Location: [facility] ENDOSCOPY;  Service: Gastroenterology;  Laterality: N/A;   ENTEROSCOPY N/A 02/28/2024   Procedure: ENTEROSCOPY;  Surgeon: San Sandor GAILS, DO;  Location: [facility] ENDOSCOPY;  Service: Gastroenterology;  Laterality: N/A;   ESOPHAGOGASTRODUODENOSCOPY (EGD) WITH PROPOFOL  N/A 03/18/2021   Procedure: ESOPHAGOGASTRODUODENOSCOPY (EGD) WITH PROPOFOL ;  Surgeon: Legrand Victory LITTIE DOUGLAS, MD;  Location: [facility] ENDOSCOPY;  Service: Gastroenterology;  Laterality: N/A;   GIVENS CAPSULE STUDY N/A 03/19/2021   Procedure: GIVENS CAPSULE STUDY;  Surgeon: Legrand Victory LITTIE DOUGLAS, MD;  Location: [facility] ENDOSCOPY;  Service: Gastroenterology;  Laterality: N/A;   GIVENS CAPSULE STUDY N/A 02/29/2024   Procedure: IMAGING PROCEDURE, GI TRACT, INTRALUMINAL, VIA CAPSULE;  Surgeon: San Sandor GAILS, DO;  Location: [facility] ENDOSCOPY;  Service: Gastroenterology;  Laterality: N/A;   HEMOSTASIS CLIP PLACEMENT  12/20/2021   Procedure: HEMOSTASIS CLIP PLACEMENT;  Surgeon: Legrand Victory LITTIE DOUGLAS, MD;  Location:  [facility] ENDOSCOPY;  Service: Gastroenterology;;   HOT HEMOSTASIS N/A 03/21/2021   Procedure: HOT HEMOSTASIS (ARGON PLASMA COAGULATION/BICAP);  Surgeon: Legrand Victory LITTIE DOUGLAS, MD;  Location: [facility] ENDOSCOPY;  Service: Gastroenterology;  Laterality: N/A;   HOT HEMOSTASIS N/A 12/20/2021   Procedure: HOT HEMOSTASIS (ARGON PLASMA COAGULATION/BICAP);  Surgeon: Legrand Victory LITTIE DOUGLAS, MD;  Location: [facility] ENDOSCOPY;  Service:  Gastroenterology;  Laterality: N/A;   HOT HEMOSTASIS N/A 03/02/2024   Procedure: EGD, WITH ARGON PLASMA COAGULATION;  Surgeon: San Sandor GAILS, DO;  Location: [facility] ENDOSCOPY;  Service: Gastroenterology;  Laterality: N/A;   HYSTEROSCOPY     IR THROMBECTOMY AV FISTULA W/THROMBOLYSIS/PTA INC/SHUNT/IMG LEFT Left 04/12/2024   IR US  GUIDE VASC ACCESS LEFT  04/12/2024   LOWER EXTREMITY ANGIOGRAPHY N/A 02/03/2024   Procedure: Lower Extremity Angiography;  Surgeon: Court Dorn PARAS, MD;  Location: [facility] INVASIVE CV LAB;  Service: Cardiovascular;  Laterality: N/A;   POLYPECTOMY  03/18/2021   Procedure: POLYPECTOMY;  Surgeon: Legrand Victory LITTIE DOUGLAS, MD;  Location: [facility] ENDOSCOPY;  Service: Gastroenterology;;    Allergies  Allergen Reactions   Atorvastatin  Other (See Comments)    Constipation   Wound Dressing Adhesive Rash    Prior to Admission medications   Medication Sig Start Date End Date Taking? Authorizing Provider  acetaminophen  (TYLENOL ) 500 MG tablet Take 500 mg by mouth every 6 (six) hours as needed for mild pain (pain score 1-3) or moderate pain (pain score 4-6).   Yes [provider]  ascorbic acid (VITAMIN C) 1000 MG tablet Take 1,000 mg by mouth at bedtime. 12/07/19  Yes [provider]  Biotin 5000 MCG TABS Take 5,000 mcg by mouth daily in the afternoon.   Yes [provider]  carboxymethylcellulose (REFRESH PLUS) 0.5 % SOLN Place 1 drop into both eyes 3 (three) times daily as needed (dry eyes).   Yes [provider]  carvedilol  (COREG ) 6.25 MG tablet Take 6.25 mg by mouth daily. 12/07/19  Yes [provider]  cyanocobalamin (VITAMIN B12) 1000 MCG tablet Take 1,000 mcg by mouth every evening.   Yes [provider]  ferric citrate  (AURYXIA ) 1 GM 210 MG(Fe) tablet Take 210 mg by mouth 3 (three) times daily with meals.   Yes [provider]  fluticasone  (FLONASE ) 50 MCG/ACT nasal spray Place 1 spray into both nostrils daily as needed  (Congestion).   Yes [provider]  furosemide  (LASIX ) 80 MG tablet Take 80 mg by mouth daily in the afternoon. 04/03/21  Yes [provider]  hydrocortisone  1 % ointment Apply 1 Application topically 2 (two) times daily. 04/14/24  Yes Amoako, Prince, MD  ibuprofen (ADVIL) 200 MG tablet Take 200-400 mg by mouth every 6 (six) hours as needed for mild pain (pain score 1-3).   Yes [provider]  Insulin  Glargine-Lixisenatide  (SOLIQUA ) 100-33 UNT-MCG/ML SOPN Inject 6 Units into the skin in the morning. 03/11/24  Yes Zheng, Michael, DO  Lactobacillus (PROBIOTIC ACIDOPHILUS PO) Take 1 capsule by mouth in the morning.   Yes [provider]  lidocaine -prilocaine  (EMLA ) cream Apply 1 application topically as needed (prior to fisula access). Tuesday, Thursday, Saturday for dialysis 07/04/20  Yes [provider]  meloxicam (MOBIC) 7.5 MG tablet Take 7.5-15 mg by mouth daily as needed for pain. 04/05/24  Yes [provider]  Menthol -Methyl Salicylate  (MUSCLE RUB) 10-15 % CREA Apply 1 Application topically 2 (two) times daily. Apply to left shoulder. Patient taking differently: Apply 1 Application topically 2 (two) times daily as needed  for muscle pain. Apply to left shoulder. 03/11/24  Yes Zheng, Michael, DO  mupirocin  ointment (BACTROBAN ) 2 % Apply 1 Application topically 2 (two) times daily. 04/21/24 05/05/24 Yes [provider]  Omega-3 Fatty Acids (FISH OIL PO) Take 1 capsule by mouth in the morning and at bedtime.   Yes [provider]  pantoprazole  (PROTONIX ) 40 MG tablet Take 1 tablet (40 mg total) by mouth 2 (two) times daily. 03/11/24  Yes Zheng, Michael, DO  polyethylene glycol (MIRALAX  / GLYCOLAX ) 17 g packet Take 17 g by mouth 2 (two) times daily as needed for moderate constipation.   Yes [provider]  senna (SENOKOT) 8.6 MG TABS tablet Take 1 tablet (8.6 mg total) by mouth daily as needed for mild constipation. 04/14/24  Yes  Amoako, Prince, MD  Vitamin E 670 MG (1000 UT) CAPS Take 1,000 Units by mouth in the morning. 01/09/20  Yes [provider]  aspirin  EC 81 MG tablet Take 1 tablet (81 mg total) by mouth daily. Swallow whole. Patient not taking: Reported on 04/24/2024 12/01/23   Krasowski, Robert J, MD  lidocaine  (LIDODERM ) 5 % Place 1 patch onto the skin daily. Remove & Discard patch within 12 hours or as directed by MD Patient not taking: Reported on 04/10/2024 03/11/24   Elicia Sharper, DO    Social History   Socioeconomic History   Marital status: Widowed    Spouse name: Not on file   Number of children: 3   Years of education: Not on file   Highest education level: Not on file  Occupational History   Not on file  Tobacco Use   Smoking status: Former    Current packs/day: 0.00    Types: Cigarettes    Quit date: 54    Years since quitting: 42.5   Smokeless tobacco: Never   Tobacco comments:    quit in 1973  Vaping Use   Vaping status: Never Used  Substance and Sexual Activity   Alcohol  use: Not Currently   Drug use: Never   Sexual activity: Not Currently  Other Topics Concern   Not on file  Social History Narrative   Not on file   Social Drivers of Health   Financial Resource Strain: Low Risk  (10/27/2021)   Received from [facility]   Overall Financial Resource Strain (CARDIA)    Difficulty of Paying Living Expenses: Not hard at all  Food Insecurity: No Food Insecurity (04/24/2024)   Hunger Vital Sign    Worried About Running Out of Food in the Last Year: Never true    Ran Out of Food in the Last Year: Never true  Transportation Needs: No Transportation Needs (04/24/2024)   PRAPARE - Administrator, Civil Service (Medical): No    Lack of Transportation (Non-Medical): No  Physical Activity: Not on file  Stress: No Stress Concern Present (10/27/2021)   Received from [facility] - Occupational Stress Questionnaire     Feeling of Stress : Not at all  Social Connections: Unknown (04/24/2024)   Social Connection and Isolation Panel    Frequency of Communication with Friends and Family: Twice a week    Frequency of Social Gatherings with Friends and Family: Twice a week    Attends Religious Services: Never    Database administrator or Organizations: No    Attends Banker Meetings: Never    Marital Status: Not on file  Recent Concern: Social  Connections - Socially Isolated (04/18/2024)   Social Connection and Isolation Panel    Frequency of Communication with Friends and Family: Twice a week    Frequency of Social Gatherings with Friends and Family: Three times a week    Attends Religious Services: Never    Active Member of Clubs or Organizations: No    Attends Banker Meetings: Never    Marital Status: Widowed  Intimate Partner Violence: Not At Risk (04/24/2024)   Humiliation, Afraid, Rape, and Kick questionnaire    Fear of Current or Ex-Partner: No    Emotionally Abused: No    Physically Abused: No    Sexually Abused: No   Family History  Problem Relation Age of Onset   Diabetes Mother    Hypertension Mother    Diabetes Maternal Grandfather    Liver disease Other     ROS: Otherwise negative unless mentioned in HPI  Physical Examination  Vitals:   04/25/24 0748 04/25/24 1155  BP: (!) 111/44 (!) 101/53  Pulse: 67 65  Resp: 18 18  Temp: 97.7 F (36.5 C)   SpO2:  97%   Body mass index is 31.7 kg/m.  General:  WDWN in NAD Gait: Not observed HENT: WNL, normocephalic Pulmonary: normal non-labored breathing, without Rales, rhonchi,  wheezing Cardiac: regular Abdomen: soft, NT/ND, no masses Skin: without rashes Vascular Exam/Pulses: 1+ left femoral, nonpalpable right femoral pulse Extremities: with ischemic changes, without Gangrene , with cellulitis; with open wounds;  Musculoskeletal: no muscle wasting or atrophy  Neurologic: A&O X 3;  No focal weakness or  paresthesias are detected; speech is fluent/normal Psychiatric:  The pt has Normal affect. Lymph:  Unremarkable  CBC    Component Value Date/Time   WBC 6.5 04/25/2024 0900   RBC 2.88 (L) 04/25/2024 0900   HGB 9.2 (L) 04/25/2024 0900   HGB 11.3 01/12/2024 1259   HCT 29.3 (L) 04/25/2024 0900   HCT 35.0 01/12/2024 1259   PLT 187 04/25/2024 0900   PLT 208 01/12/2024 1259   MCV 101.7 (H) 04/25/2024 0900   MCV 99 (H) 01/12/2024 1259   MCH 31.9 04/25/2024 0900   MCHC 31.4 04/25/2024 0900   RDW 19.1 (H) 04/25/2024 0900   RDW 15.7 (H) 01/12/2024 1259   LYMPHSABS 0.9 04/24/2024 2145   LYMPHSABS 0.9 01/12/2024 1259   MONOABS 0.4 04/24/2024 2145   EOSABS 0.1 04/24/2024 2145   EOSABS 0.1 01/12/2024 1259   BASOSABS 0.1 04/24/2024 2145   BASOSABS 0.0 01/12/2024 1259    BMET    Component Value Date/Time   NA 131 (L) 04/25/2024 0900   NA 134 01/12/2024 1259   K 5.0 04/25/2024 0900   CL 90 (L) 04/25/2024 0900   CO2 24 04/25/2024 0900   GLUCOSE 88 04/25/2024 0900   BUN 48 (H) 04/25/2024 0900   BUN 31 (H) 01/12/2024 1259   CREATININE 6.03 (H) 04/25/2024 0900   CALCIUM  8.8 (L) 04/25/2024 0900   GFRNONAA 7 (L) 04/25/2024 0900    COAGS: Lab Results  Component Value Date   INR 1.3 (H) 04/10/2024   INR 1.4 (H) 03/02/2024   INR 1.0 03/29/2021     ASSESSMENT/PLAN: This is a 83 y.o. female presenting with malaise and wounds to the right lower extremity.  I have seen Anna Carr twice today in an effort to come up with a treatment plan for her.  The 3 of us -Anna Carr, her daughter, and myself, had a long discussion regarding her current comorbidities and medical problems.  In  short, Anna Carr is a 82 year old female with end-stage renal disease being dialyzed through a left arm graft.  Anna Carr has been in and out of the hospital over the last several months with GI bleed, pulmonary insufficiency, acute heart failure exacerbation.  Anna Carr is obese, and continues to have fluid on board.  Since her GI bleed,  Anna Carr has not been on baby aspirin .  When I evaluate her feet, I do think at some point there was a cellulitic component to this.  I would recommend antibiotics for the next 7 days to ensure there is no residual infection.  The erythema on the dorsal aspect of the foot could also be fungal.  The erythema at the tips of the toes is due to ischemia.  Critical limb ischemia as documented by Dr. Wadie is not new, and has been present for quite some time.  The question is what is the best course of treatment.  If Anna Carr cannot tolerate antiplatelet therapy, there is not a limb salvage intervention that I can perform I would like for her to be placed on aspirin  and Plavix in trial to see if bleeding occurs.  If bleeding occurs, the only surgery I would offer would be continued medical management, with subsequent amputation if necessary down the road.  My plan is to ensure Anna Carr can tolerate it over the next 2 weeks.  Currently I plan to see her in the outpatient setting in 2 weeks, to discuss diagnostic angiogram of the right lower extremity to further define flow into the anterior tibial artery.  Being the wounds have not been present for long, and occurred due to fluid overload, there is a chance that he will, albeit low.  If Anna Carr has vein - a study that I have ordered today, I will discuss iliofemoral endarterectomy with femoral to anterior tibial artery bypass.  I think with vein there is a possibility it would work.  With plastic, this would fail.  If there is no vein present, I would offer iliofemoral neurectomy with future endovascular intervention if it is necessary.  I appreciate Dr. Ranee input in her care, most notably her cardiac risk profile with open surgery.    My plan is to follow-up with her tomorrow regarding Dr. Sherry assessment, as well as vein mapping studies.     Fonda FORBES Rim MD MS [facility] [phone] 04/25/2024  2:30 PM

## 2024-04-25 NOTE — Progress Notes (Addendum)
 HD#1 SUBJECTIVE:  Patient Summary: Anna Carr is a 83 y.o. 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   Overnight Events: No overnight events.   Interim History: Patient was at bedside this morning. Patient reports that she feels that her right lower extremity wound from ischemia was improving on her antibiotics. It was discussed that it is most likely due to loss of blood flow to the area and less likely infection as patient does not have any current signs of infection. She notes that she had weakness and  fogginess that started on Sunday and was taken to the ER at [facility]. She states that the wound on her leg has been present for the last 2 hospitalizations. She has been using an ointment that relieves the pain over her wound somewhat. Her pain has improved somewhat over the last couple of days. She states that she started her antibiotics on Friday of last week. She also completed a dialysis session on Saturday and was scheduled to go today. Patient also denies any acute blood loss and denies hematochezia, hematemesis. Patient does endorse that she has exertional dyspnea that she has been struggling with for awhile. Patient was eager to know if procedure was going to be done today. Patient was told she would be able to eat for now while she waits to be evaluated. No other complaints at this time.   OBJECTIVE:  Vital Signs: Vitals:   04/25/24 0013 04/25/24 0432 04/25/24 0748 04/25/24 1155  BP: (!) 116/49 109/66 (!) 111/44 (!) 101/53  Pulse: 69 71 67 65  Resp: 18 18 18 18   Temp: (!) 97.5 F (36.4 C) (!) 97.5 F (36.4 C) 97.7 F  (36.5 C)   TempSrc: Oral Oral    SpO2: 93% 90%  97%  Weight:  76.1 kg    Height:       Supplemental O2: Room Air SpO2: 97 % O2 Flow Rate (L/min): 3 L/min  Filed Weights   04/24/24 1512 04/25/24 0432  Weight: 72.5 kg 76.1 kg    No intake or output data in the 24 hours ending 04/25/24 1421 Net IO Since Admission: No IO data has been entered for this period [04/25/24 1421]  Physical Exam: Physical Exam  Cardiovascular:     Rate and Rhythm: Normal rate and regular rhythm.     Heart sounds: Normal heart sounds.  Pulmonary:     Effort: Pulmonary effort is normal. No respiratory distress.     Breath sounds: Normal breath sounds.     Comments: Diminished lung sounds at the bases bilaterally  Musculoskeletal:     Right lower leg: Edema present.     Left lower leg: Edema present.     Comments: 2+ pitting edema in lower extremities and pedal edema as well. Patient has non oozing healing wound on her right heel. Also has healing wound on her left heel. Wound from ischemia is on right side of the right foot.  Skin was cool to the touch on both left and right lower extremities. TP and DP pulses were not palpable on right and left lower extremities by touch.    Neurological:     Mental Status: She is oriented to  person, place, and time.     Patient Lines/Drains/Airways Status     Active Line/Drains/Airways     Name Placement date Placement time Site Days   Peripheral IV 04/25/24 22 G Anterior;Right Antecubital 04/25/24  0358  Antecubital  less than 1   Fistula / Graft Left Upper arm Arteriovenous vein graft 03/12/21  1254  Upper arm  1140   Wound / Incision (Open or Dehisced) 02/26/24 Other (Comment) Knee Right;Left;Anterior bilateral knee abrasion 02/26/24  1900  Knee  59   Wound 04/10/24 Other (Comment) Leg Right 04/10/24  --  Leg  15   Wound 04/12/24 0534 Other (Comment) Arm Lower;Posterior;Proximal;Right 04/12/24  0534  Arm  13   Wound 04/12/24 0536 Other (Comment) Thigh  Posterior;Proximal;Right 04/12/24  0536  Thigh  13   Wound 04/12/24 0539 Other (Comment) Pretibial Distal;Right 04/12/24  0539  Pretibial  13   Wound 04/12/24 0541 Pressure Injury Heel Posterior;Right Stage 2 -  Partial thickness loss of dermis presenting as a shallow open injury with a red, pink wound bed without slough. 04/12/24  0541  Heel  13   Wound 04/12/24 0542 Pressure Injury Heel Left;Posterior Stage 1 -  Intact skin with non-blanchable redness of a localized area usually over a bony prominence. 04/12/24  0542  Heel  13   Wound 04/12/24 0543 Other (Comment) Toe (Comment  which one) Anterior;Right 04/12/24  0543  Toe (Comment  which one)  13   Wound 04/12/24 0544 Other (Comment) Toe (Comment  which one) Anterior;Right 04/12/24  0544  Toe (Comment  which one)  13   Wound 04/12/24 1322 Other (Comment) Arm Anterior;Left;Lower 04/12/24  1322  Arm  13            Pertinent labs and imaging:      Latest Ref Rng & Units 04/25/2024    9:00 AM 04/24/2024    9:45 PM 04/19/2024    4:47 AM  CBC  WBC 4.0 - 10.5 K/uL 6.5  6.6  6.9   Hemoglobin 12.0 - 15.0 g/dL 9.2  8.7  8.4   Hematocrit 36.0 - 46.0 % 29.3  28.4  26.6   Platelets 150 - 400 K/uL 187  179  204        Latest Ref Rng & Units 04/25/2024    9:00 AM 04/24/2024    9:45 PM 04/19/2024    4:47 AM  CMP  Glucose 70 - 99 mg/dL 88  797  822   BUN 8 - 23 mg/dL 48  43  33   Creatinine 0.44 - 1.00 mg/dL 3.96  4.39  6.15   Sodium 135 - 145 mmol/L 131  132  129   Potassium 3.5 - 5.1 mmol/L 5.0  4.9  4.3   Chloride 98 - 111 mmol/L 90  91  92   CO2 22 - 32 mmol/L 24  25  25    Calcium  8.9 - 10.3 mg/dL 8.8  8.8  8.9     No results found.  ASSESSMENT/PLAN:  Assessment: Principal Problem:   Critical limb ischemia of right lower extremity ([name]) Active Problems:   CHF with left ventricular diastolic dysfunction, NYHA class 1 ([name])   Type 2 diabetes mellitus with chronic kidney disease on chronic dialysis, with long-term current use of  insulin  ([name])   ESRD on hemodialysis ([name])   Hypertension   Anemia due to chronic blood loss   Peripheral vascular disease, unspecified ([name]) critical stenosis lower extremity   Angiodysplasia of small intestine  Anna Carr is a 83 y.o. 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   Plan: #Right Lower Extremity Critical Limb Ischemia  RLE wound  - Patient's DP/PT pulses on the right lower extremity were not palpable at [facility] ED. She was transferred to this facility for vascular surgery evaluation of the lower extremities. Doppler was done at admission and appreciated flow in bilateral popliteal pulses and left posterior tibial pulse. Right sided DP and PT pulses and left sided DP pulse were not found. Bilateral lower extremities below the knee are cool to the touch. Vascular surgery aware of patient and consulted. Will await their recommendations  - Wound care consulted  -PT and OT on board.  -Aspirin  81 mg daily   #Presyncope -Patient has endorsed dizziness and lightheadedness that has been happening for awhile. She notices these episodes with any exertion and when standing from a seated position.  -Patient also has signs of vascular congestion and bilateral pleural effusions as seen on CT and CXR. NT pro BNP was 111000 at [place] ed.  - EKG at [facility] showed sinus rhythm with marked sinus arrhythmia. Repeat EKG has been ordered  #Duodenal AVMs  w/ recent GI Bleed Patient presented with generalized weakness and lightheadedness. Hgb at [facility] ED was 9.3 and is currently at 8.7 today. Patient denies any signs of bleeding symptoms.  Patient has history of chronic GI bleeds and blood transfusions for this reason. Reports she has been taking Protonix  40 mg BID since her most recent discharge.  - Monitor H/H and notify MD/DO for signs of melena and hematochezia in the setting of heparin   #ESRD on HD (TThS)  - Last HD was 6/28. - Consulted nephrology this morning for HD and is scheduled for hemodialysis today.  - Resume home phosphorous binders per nephrology  -CBC and renal function panel  #HFpEF  - CXR and CT show signs of vascular congestion and bilateral pleural effusions.  -Patient last went to hemodialysis on Saturday and is scheduled to go today -Patient takes home dose of lasix  80 mg. Resumed today   -Monitor I&Os  #Type 2 Diabetes Mellitus  - Home medications include Soliqua  6 units each morning. - At this time patient is not NPO and has diet in place - Last A1c was 5.8 on 5/21 -SSI is in place   #Hypertension  - Blood pressure have been soft since admission.  - Patient's home med is carvedilol  6.25 mg BID.  - In the setting of softer blood pressures this is being held.     Best Practice: Diet: Renal diet IVF: Fluids:none VTE: heparin  injection 5,000 Units Start: 04/24/24 2200 Code: Full  Disposition planning: Therapy Recs: Pt will continue to follow  Family Contact: to be notified. DISPO: Anticipated discharge home vs rehab pending procedure or intervention.  Signature:  Kamla Skilton [facility]  2:21 PM, 04/25/2024  On Call pager [phone]

## 2024-04-25 NOTE — Progress Notes (Signed)
 Vascular to see this morning.  Patient can eat.

## 2024-04-25 NOTE — Progress Notes (Signed)
 Transition of Care [facility]) - Inpatient Brief Assessment   Patient Details  Name: Anna Carr MRN: [id number] Date of Birth: October 31, 1940  Transition of Care [facility]) CM/SW Contact:    Rosaline JONELLE Joe, RN Phone Number: 04/25/2024, 1:26 PM   Clinical Narrative: Patient admitted to the hospital for critical limb ischemia of right lower extremity.  Patient is active with [facility] for RN, PT.  HH orders placed for [facility] PT, RN to be co-signed by the MD.  CM will continue to follow the patient for TOC needs.  No other DME needed at this time.   Transition of Care Asessment: Insurance and Status: (P) Insurance coverage has been reviewed Patient has primary care physician: (P) Yes Home environment has been reviewed: (P) from home with family Prior level of function:: (P) RW, WC Prior/Current Home Services: (P) Current home services (Active with [facility]) Social Drivers of Health Review: (P) SDOH reviewed interventions complete Readmission risk has been reviewed: (P) Yes Transition of care needs: (P) transition of care needs identified, TOC will continue to follow

## 2024-04-25 NOTE — Plan of Care (Signed)
   Problem: Education: Goal: Knowledge of General Education information will improve Description: Including pain rating scale, medication(s)/side effects and non-pharmacologic comfort measures Outcome: Progressing   Problem: Activity: Goal: Risk for activity intolerance will decrease Outcome: Progressing   Problem: Coping: Goal: Level of anxiety will decrease Outcome: Progressing

## 2024-04-25 NOTE — Hospital Course (Signed)
 HPI She notes that she had weakness fogginess that started on Sunday and was taken to the ER. She states that the wound on her leg has been present for the last 2 hospitalizations. She has been using an ointment that relieves the pain over her wound somewhat. Her pain has improved somewhat over the last couple of days. She states that she started her antibiotics on Friday of last week. She completed a dialysis session on Saturday and was scheduled to go today.  Physical exam Calcaneal wound on the right side Healed calcaneal wound on the left Able to move both extremities Right toes  3+ pitting edema Lung exam *** Non-labored breathing

## 2024-04-25 NOTE — Progress Notes (Signed)
 Bilateral lower extremity vein mapping has been completed. Preliminary results can be found in CV Proc through chart review.   04/25/24 4:05 PM Cathlyn Collet RVT

## 2024-04-25 NOTE — Progress Notes (Signed)
 HD Note:  Some information was entered later than the data was gathered due to patient care needs. The stated time with the data is accurate.  Received patient in bed to unit.   Alert and oriented.   Informed consent signed and in chart.   Access used: Left arm AVG Access issues: None  Patient tolerated treatment fairly well.   BP soft  TX duration: 3.5 hours  Alert, without acute distress.  Total UF removed: 2.6L.  Goal not met due to hypotension. Asymptomatic  Hand-off given to patient's nurse.   Transported back to the room   Bobetta Lango RN [facility] Unit.

## 2024-04-25 NOTE — Evaluation (Signed)
 Physical Therapy Evaluation Patient Details Name: Anna Carr MRN: [id number] DOB: [birth year] Today's Date: 04/25/2024  History of Present Illness  Patient is an 83 y/o female admitted 04/24/24 due to generalized weakness, dyspnea and R LE wound.  Found to have elevated BNP, B pleural effusions, and concern for absent pulses R DP and PT and for vascular evaluation.  PMH positive for recent admissions 5/8-5/19 and 6/23-6/25 with GIB and symptomatic anemia, ESRD on HD TTS, PAD, HFpEF, DM, HTN, anemia and h/o falls.  Clinical Impression  Patient presents with decreased mobility due to limited activity tolerance and generalized weakness.  Previously mobilizing with RW at home unassisted.  She was home for a few days and felt progressing not using walker at home for bathroom trips, though had a weak episode and would have fallen if son had not had chair close.  She was able to walk to bathroom in her room with RW and S and around bed with CGA without device.  She demonstrated drop in BP 20 mmHg with initial standing but recovered in standing for 3 minutes.  Daughter present and educated in potential causes for drop in BP, for general weakness and for need to stand several seconds prior to ambulation.  PT will continue to follow while in the acute setting.  Appropriate for home at d/c to resume HHPT.   Orthostatic VS for the past 24 hrs (Last 3 readings):  BP- Sitting Pulse- Sitting BP- Standing at 0 minutes Pulse- Standing at 0 minutes BP- Standing at 3 minutes Pulse- Standing at 3 minutes  04/25/24 1100 120/49 77 100/54 76 116/48 76       If plan is discharge home, recommend the following: A little help with walking and/or transfers;A little help with bathing/dressing/bathroom;Assistance with cooking/housework;Direct supervision/assist for medications management;Direct supervision/assist for financial management;Assist for transportation;Help with stairs or ramp for entrance   Can travel by private  vehicle        Equipment Recommendations None recommended by PT  Recommendations for Other Services       Functional Status Assessment       Precautions / Restrictions Precautions Precautions: Fall Recall of Precautions/Restrictions: Intact Precaution/Restrictions Comments: BP, HR, SpO2 Restrictions Weight Bearing Restrictions Per Provider Order: No      Mobility  Bed Mobility               General bed mobility comments: in recliner    Transfers Overall transfer level: Modified independent Equipment used: None               General transfer comment: no assist for transfer from recliner; no RW as would not fit around bed to get to chair; used bed rail and armrests    Ambulation/Gait Ambulation/Gait assistance: Supervision, Contact guard assist Gait Distance (Feet): 20 Feet Assistive device: Rolling walker (2 wheels) Gait Pattern/deviations: Step-through pattern, Decreased stride length       General Gait Details: in the room to bathroom and back with RW and close S for safety; using bed rail to walk around bed without walker and CGA for safety  Stairs            Wheelchair Mobility     Tilt Bed    Modified Rankin (Stroke Patients Only)       Balance Overall balance assessment: Needs assistance   Sitting balance-Leahy Scale: Good     Standing balance support: No upper extremity supported, During functional activity Standing balance-Leahy Scale: Fair Standing balance comment: completing toilet  hygiene and pulling up briefs                             Pertinent Vitals/Pain Pain Assessment Pain Assessment: 0-10 Pain Score: 2  Pain Location: RLE Pain Descriptors / Indicators: Aching, Discomfort Pain Intervention(s): Monitored during session    Home Living Family/patient expects to be discharged to:: Private residence Living Arrangements: Children Available Help at Discharge: Family;Available 24 hours/day Type of  Home: House Home Access: Ramped entrance       Home Layout: One level Home Equipment: Rolling Walker (2 wheels);Grab bars - tub/shower;Wheelchair - manual;Rollator (4 wheels);BSC/3in1;Cane - single point;Shower seat Additional Comments: son is her caretaker, daughter on leave from work right now also helping    Prior Function Prior Level of Function : Needs assist;History of Falls (last six months)             Mobility Comments: RW since March, RW does not into bathroom, holds onto counter to toilet. ADLs Comments: manages own meds, set up A for ADLs     Extremity/Trunk Assessment   Upper Extremity Assessment Upper Extremity Assessment: Defer to OT evaluation    Lower Extremity Assessment Lower Extremity Assessment: RLE deficits/detail RLE Deficits / Details: errythema on foot and edema throughout LE; skin injury on shin and deep tissue injury on heel with dressing intact; strength grossly WFL RLE Sensation: decreased light touch LLE Deficits / Details: deep tissue injury to heel, ROM and strength WFL; wound on heel    Cervical / Trunk Assessment Cervical / Trunk Assessment: Kyphotic  Communication   Communication Communication: Impaired Factors Affecting Communication: Hearing impaired    Cognition Arousal: Alert Behavior During Therapy: WFL for tasks assessed/performed   PT - Cognitive impairments: No apparent impairments                         Following commands: Intact       Cueing Cueing Techniques: Verbal cues     General Comments General comments (skin integrity, edema, etc.): on RA difficult to obtain SpO2 with fingers cold, noted some dyspnea with standing for orthostatics though once warmed her hands noted SpO2 97-98% on RA; educated pt and daughter on use of O2 which she has at home when feeling SOB especially with trips to bathroom, etc; also discussed potential causes of orthostatic hypotension or even generalized weak feeling  (hyponatremia, post-dialysis, low hemoglobin, losses from diuresis, etc)    Exercises     Assessment/Plan    PT Assessment Patient needs continued PT services  PT Problem List Decreased activity tolerance;Decreased mobility;Cardiopulmonary status limiting activity;Pain;Decreased skin integrity;Decreased strength;Decreased balance       PT Treatment Interventions DME instruction;Gait training;Functional mobility training;Therapeutic exercise;Therapeutic activities;Balance training;Patient/family education    PT Goals (Current goals can be found in the Care Plan section)  Acute Rehab PT Goals Patient Stated Goal: get back to independent PT Goal Formulation: With patient/family Time For Goal Achievement: 05/09/24 Potential to Achieve Goals: Good    Frequency Min 2X/week     Co-evaluation               AM-PAC PT 6 Clicks Mobility  Outcome Measure Help needed turning from your back to your side while in a flat bed without using bedrails?: None Help needed moving from lying on your back to sitting on the side of a flat bed without using bedrails?: None Help needed moving to and from  a bed to a chair (including a wheelchair)?: A Little Help needed standing up from a chair using your arms (e.g., wheelchair or bedside chair)?: None Help needed to walk in hospital room?: A Little Help needed climbing 3-5 steps with a railing? : A Little 6 Click Score: 21    End of Session   Activity Tolerance: Patient tolerated treatment well Patient left: in chair;with call bell/phone within reach;with family/visitor present   PT Visit Diagnosis: Other abnormalities of gait and mobility (R26.89);Muscle weakness (generalized) (M62.81);Pain Pain - Right/Left: Right Pain - part of body: Ankle and joints of foot    Time: 1100-1127 PT Time Calculation (min) (ACUTE ONLY): 27 min   Charges:   PT Evaluation $PT Eval Moderate Complexity: 1 Mod PT Treatments $Self Care/Home Management:  8-22 PT General Charges $$ ACUTE PT VISIT: 1 Visit         Micheline Portal, PT Acute Rehabilitation Services Office:[phone] 04/25/2024   Montie Portal 04/25/2024, 1:31 PM

## 2024-04-25 NOTE — Evaluation (Signed)
 Occupational Therapy Evaluation Patient Details Name: Anna Carr MRN: [id number] DOB: 09/20/1941 Today's Date: 04/25/2024   History of Present Illness   Patient is an 83 y/o female admitted 04/24/24 due to generalized weakness, dyspnea and R LE wound.  Found to have elevated BNP, B pleural effusions, and concern for absent pulses R DP and PT and for vascular evaluation.  PMH positive for recent admissions 5/8-5/19 and 6/23-6/25 with GIB and symptomatic anemia, ESRD on HD TTS, PAD, HFpEF, DM, HTN, anemia and h/o falls.     Clinical Impressions Pt c/o mild pain to RLE, feels better after getting meds. Pt lives at home with son who is available 24/7, daughter lives next door. PLOF mod I with RW. Pt currently at PLOF, mod I for all mobility and ADLs in room, Pt ambulating without assistance in room to bathroom and back, able to don/doff socks and manage hygiene independently. Pt instructed on BUE exercises to maximize activity tolerance and strength. Pt was receiving HHPT/OT prior to admission, plan to continue HHOT, no further acute OT needs, mobility to follow to maximize strength and independence.      If plan is discharge home, recommend the following:   Assistance with cooking/housework;Assist for transportation;Help with stairs or ramp for entrance     Functional Status Assessment   Patient has had a recent decline in their functional status and demonstrates the ability to make significant improvements in function in a reasonable and predictable amount of time.     Equipment Recommendations   None recommended by OT     Recommendations for Other Services         Precautions/Restrictions   Precautions Precautions: Fall Recall of Precautions/Restrictions: Intact Precaution/Restrictions Comments: BP, HR, SpO2 Restrictions Weight Bearing Restrictions Per Provider Order: No     Mobility Bed Mobility Overal bed mobility: Modified Independent                   Transfers Overall transfer level: Modified independent Equipment used: Rolling walker (2 wheels)                      Balance Overall balance assessment: Mild deficits observed, not formally tested                                         ADL either performed or assessed with clinical judgement   ADL Overall ADL's : Modified independent                                             Vision Baseline Vision/History: 1 Wears glasses Ability to See in Adequate Light: 2 Moderately impaired Patient Visual Report: No change from baseline       Perception         Praxis         Pertinent Vitals/Pain Pain Assessment Pain Assessment: 0-10 Pain Score: 2  Pain Location: RLE Pain Descriptors / Indicators: Aching, Discomfort Pain Intervention(s): Monitored during session     Extremity/Trunk Assessment     Lower Extremity Assessment Lower Extremity Assessment: Defer to PT evaluation       Communication Communication Communication: Impaired Factors Affecting Communication: Hearing impaired   Cognition Arousal: Alert Behavior During Therapy: WFL for tasks assessed/performed Cognition: No apparent impairments  Following commands: Intact       Cueing  General Comments   Cueing Techniques: Verbal cues      Exercises     Shoulder Instructions      Home Living Family/patient expects to be discharged to:: Private residence Living Arrangements: Children Available Help at Discharge: Family;Available 24 hours/day Type of Home: House Home Access: Ramped entrance     Home Layout: One level     Bathroom Shower/Tub: Tub/shower unit;Walk-in shower   Bathroom Toilet: Handicapped height Bathroom Accessibility: No   Home Equipment: Agricultural consultant (2 wheels);Grab bars - tub/shower;Wheelchair - manual;Rollator (4 wheels);BSC/3in1;Cane - single point;Shower seat      Lives With:  Son    Prior Functioning/Environment Prior Level of Function : Needs assist;History of Falls (last six months)             Mobility Comments: RW since March, RW does not into bathroom, holds onto counter to toilet. ADLs Comments: manages own meds, set up A for ADLs    OT Problem List: Decreased strength;Decreased activity tolerance;Pain   OT Treatment/Interventions:        OT Goals(Current goals can be found in the care plan section)   Acute Rehab OT Goals Patient Stated Goal: to improve activity tolerance OT Goal Formulation: With patient/family Time For Goal Achievement: 05/09/24 Potential to Achieve Goals: Good   OT Frequency:       Co-evaluation              AM-PAC OT 6 Clicks Daily Activity     Outcome Measure Help from another person eating meals?: None Help from another person taking care of personal grooming?: None Help from another person toileting, which includes using toliet, bedpan, or urinal?: None Help from another person bathing (including washing, rinsing, drying)?: None Help from another person to put on and taking off regular upper body clothing?: None Help from another person to put on and taking off regular lower body clothing?: None 6 Click Score: 24   End of Session Equipment Utilized During Treatment: Rolling walker (2 wheels) Nurse Communication: Mobility status  Activity Tolerance: Patient tolerated treatment well Patient left: in bed;with call bell/phone within reach;with family/visitor present  OT Visit Diagnosis: Muscle weakness (generalized) (M62.81);Pain Pain - Right/Left: Right Pain - part of body: Leg                Time: 8790-8765 OT Time Calculation (min): 25 min Charges:  OT General Charges $OT Visit: 1 Visit OT Evaluation $OT Eval Low Complexity: 1 Low OT Treatments $Self Care/Home Management : 8-22 mins  [place], OTR/L   Elouise JONELLE Bott 04/25/2024, 12:41 PM

## 2024-04-25 NOTE — Consult Note (Addendum)
 WOC Nurse Consult Note: this patient is familiar to WOC team from previous admissions with scattered RLE wounds Reason for Consult: R arterial leg wounds  Wound type: full thickness r/t ischemia  Pressure Injury POA: NA  Measurement: see nursing flowsheet  Wound bed: scattered areas; some pink moist, some with yellow necrotic tissue  Drainage (amount, consistency, odor) see nursing flowsheet  Periwound: edema, erythema  Dressing procedure/placement/frequency: Cleanse scattered wounds to R lower leg, R foot/toes and R heel with Vashe wound cleanser Soila [phone]) do not rinse and allow to air dry.  Apply Hydrocortisone  cream as ordered by primary MD.  Cover any open areas and R heel with Xeroform gauze Soila [phone]) and secure with silicone foam or Kerlix roll gauze whichever patient prefers.   POC discussed with bedside nurse. Patient is pending evaluation by vascular surgery for concerns over need for re-vascularization to this leg.  WOC team will not follow. Re-consult if further needs arise.   Thank you,    Powell Bar MSN, RN-BC, [employer]

## 2024-04-26 DIAGNOSIS — L97519 Non-pressure chronic ulcer of other part of right foot with unspecified severity: Secondary | ICD-10-CM

## 2024-04-26 DIAGNOSIS — K5520 Angiodysplasia of colon without hemorrhage: Secondary | ICD-10-CM

## 2024-04-26 LAB — RENAL FUNCTION PANEL
Albumin: 2.8 g/dL — ABNORMAL LOW (ref 3.5–5.0)
Anion gap: 17 — ABNORMAL HIGH (ref 5–15)
BUN: 46 mg/dL — ABNORMAL HIGH (ref 8–23)
CO2: 24 mmol/L (ref 22–32)
Calcium: 9.1 mg/dL (ref 8.9–10.3)
Chloride: 89 mmol/L — ABNORMAL LOW (ref 98–111)
Creatinine, Ser: 5.35 mg/dL — ABNORMAL HIGH (ref 0.44–1.00)
GFR, Estimated: 8 mL/min — ABNORMAL LOW (ref >60)
Glucose, Bld: 69 mg/dL — ABNORMAL LOW (ref 70–99)
Phosphorus: 6.3 mg/dL — ABNORMAL HIGH (ref 2.5–4.6)
Potassium: 4.8 mmol/L (ref 3.5–5.1)
Sodium: 130 mmol/L — ABNORMAL LOW (ref 135–145)

## 2024-04-26 LAB — CBC
HCT: 28.9 % — ABNORMAL LOW (ref 36.0–46.0)
Hemoglobin: 9.2 g/dL — ABNORMAL LOW (ref 12.0–15.0)
MCH: 32.2 pg (ref 26.0–34.0)
MCHC: 31.8 g/dL (ref 30.0–36.0)
MCV: 101 fL — ABNORMAL HIGH (ref 80.0–100.0)
Platelets: 200 10*3/uL (ref 150–400)
RBC: 2.86 MIL/uL — ABNORMAL LOW (ref 3.87–5.11)
RDW: 19 % — ABNORMAL HIGH (ref 11.5–15.5)
WBC: 7.3 10*3/uL (ref 4.0–10.5)
nRBC: 0 % (ref 0.0–0.2)

## 2024-04-26 LAB — GLUCOSE, CAPILLARY
Glucose-Capillary: 198 mg/dL — ABNORMAL HIGH (ref 70–99)
Glucose-Capillary: 248 mg/dL — ABNORMAL HIGH (ref 70–99)
Glucose-Capillary: 259 mg/dL — ABNORMAL HIGH (ref 70–99)

## 2024-04-26 MED ORDER — INSULIN ASPART 100 UNIT/ML IJ SOLN
0.0000 [IU] | Freq: Three times a day (TID) | INTRAMUSCULAR | Status: DC
  Administered 2024-04-26: 3 [IU] via SUBCUTANEOUS

## 2024-04-26 MED ORDER — ROSUVASTATIN CALCIUM 5 MG PO TABS
10.0000 mg | ORAL_TABLET | Freq: Every day | ORAL | Status: DC
  Administered 2024-04-26 – 2024-04-29 (×3): 10 mg via ORAL
  Filled 2024-04-26 (×3): qty 2

## 2024-04-26 NOTE — Progress Notes (Addendum)
 HD#2 SUBJECTIVE:  Patient Summary: LELER BRION is a 83 y.o. 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   Overnight Events: No overnight events  Interim History: Patient notes burning, stinging, and itching around her anterior perineal and around the vulva. She notes that she has not been urinating, but she is on hemodialysis. She thinks that this could be due to the underwear that she was given at [facility]. She denies any bleeding symptoms. She had a bowel movement late last night that was non bloody and brown colored. She had hard stool followed by some loose stools as well. She had complained about right sided pain that she attributes to gas because it occurs after she eats. She takes fish oil twice a day for her hyperlipidemia.She was previously constipated when taking Lipitor. Cholesterol lowering agents were suggested due history of peripheral artery disease. Patient was informed we would try to add a new agent and see how she tolerated it. Patient also endorses some shortness of breath when getting out of the bed. She says this is a chronic issue but has been getting worse during this last month.No other complaints at this time.Her daughter [name] assisted with providing history.    OBJECTIVE:  Vital Signs: Vitals:   04/25/24 2358 04/26/24 0009 04/26/24 0049 04/26/24 0354  BP: (!) 111/42 (!) 111/52  (!) 112/42  Pulse: 70 66  60  Resp: 19 (!) 35 20 18  Temp:  97.9 F (36.6 C)  97.6 F (36.4 C)  TempSrc:  Oral    SpO2: 100% 98%  95%  Weight:      Height:       Supplemental O2: Room Air SpO2: 95 % O2  Flow Rate (L/min): 3 L/min  Filed Weights   04/24/24 1512 04/25/24 0432  Weight: 72.5 kg 76.1 kg     Intake/Output Summary (Last 24 hours) at 04/26/2024 0616 Last data filed at 04/25/2024 2355 Gross per 24 hour  Intake --  Output 2600 ml  Net -2600 ml   Net IO Since Admission: -2,600 mL [04/26/24 0616]  Physical Exam: Physical Exam Cardiovascular:     Rate and Rhythm: Normal rate and regular rhythm.     Heart sounds: Normal heart sounds.  Pulmonary:     Effort: Pulmonary effort is normal.     Breath sounds: Rales present.     Comments: Rales at lung bases but improved from yesterday Genitourinary:    Comments: Chaperone present during GU exam. Erythema of the labia, but no drainage, pustules or other lesions noted.  Musculoskeletal:     Right lower leg: Edema present.     Left lower leg: Edema present.     Comments: 1+ edema   Skin:    Comments: Skin was cool to the touch on the right foot. Patient's wound on right foot were not oozing. Pedal edema 2+ was present on the right foot.   Neurological:     Mental Status: She is alert.     Patient Lines/Drains/Airways Status     Active Line/Drains/Airways     Name Placement date Placement time Site Days   Peripheral IV 04/25/24 22 G Anterior;Right Antecubital 04/25/24  0358  Antecubital  1  Fistula / Graft Left Upper arm Arteriovenous vein graft 03/12/21  1254  Upper arm  1141   Wound / Incision (Open or Dehisced) 02/26/24 Other (Comment) Knee Right;Left;Anterior bilateral knee abrasion 02/26/24  1900  Knee  60   Wound 04/10/24 Other (Comment) Leg Right 04/10/24  --  Leg  16   Wound 04/12/24 0534 Other (Comment) Arm Lower;Posterior;Proximal;Right 04/12/24  0534  Arm  14   Wound 04/12/24 0536 Other (Comment) Thigh Posterior;Proximal;Right 04/12/24  0536  Thigh  14   Wound 04/12/24 0539 Other (Comment) Pretibial Distal;Right 04/12/24  0539  Pretibial  14   Wound 04/12/24 0541 Pressure Injury Heel Posterior;Right Stage 2 -   Partial thickness loss of dermis presenting as a shallow open injury with a red, pink wound bed without slough. 04/12/24  0541  Heel  14   Wound 04/12/24 0542 Pressure Injury Heel Left;Posterior Stage 1 -  Intact skin with non-blanchable redness of a localized area usually over a bony prominence. 04/12/24  0542  Heel  14   Wound 04/12/24 0543 Other (Comment) Toe (Comment  which one) Anterior;Right 04/12/24  0543  Toe (Comment  which one)  14   Wound 04/12/24 0544 Other (Comment) Toe (Comment  which one) Anterior;Right 04/12/24  0544  Toe (Comment  which one)  14   Wound 04/12/24 1322 Other (Comment) Arm Anterior;Left;Lower 04/12/24  1322  Arm  14            Pertinent labs and imaging:     Latest Ref Rng & Units 04/26/2024    8:20 AM 04/25/2024    9:00 AM 04/24/2024    9:45 PM  CBC  WBC 4.0 - 10.5 K/uL 7.3  6.5  6.6   Hemoglobin 12.0 - 15.0 g/dL 9.2  9.2  8.7   Hematocrit 36.0 - 46.0 % 28.9  29.3  28.4   Platelets 150 - 400 K/uL 200  187  179        Latest Ref Rng & Units 04/26/2024    8:20 AM 04/25/2024    9:00 AM 04/24/2024    9:45 PM  CMP  Glucose 70 - 99 mg/dL 69  88  797   BUN 8 - 23 mg/dL 46  48  43   Creatinine 0.44 - 1.00 mg/dL 4.64  3.96  4.39   Sodium 135 - 145 mmol/L 130  131  132   Potassium 3.5 - 5.1 mmol/L 4.8  5.0  4.9   Chloride 98 - 111 mmol/L 89  90  91   CO2 22 - 32 mmol/L 24  24  25    Calcium  8.9 - 10.3 mg/dL 9.1  8.8  8.8     VAS US  LOWER EXTREMITY SAPHENOUS VEIN MAPPING Result Date: 04/25/2024 LOWER EXTREMITY VEIN MAPPING Patient Name:  TAELOR WAYMIRE  Date of Exam:   04/25/2024 Medical Rec #: [id number]          Accession #:    [id number] Date of Birth: 08-23-1941          Patient Gender: F Patient Age:   61 years Exam Location:  [facility] Procedure:      VAS US  LOWER EXTREMITY SAPHENOUS VEIN MAPPING Referring Phys: FONDA ROBINS --------------------------------------------------------------------------------  Indications:  Ischemia Risk Factors:  Hypertension, Diabetes.  Comparison Study: No prior studies. Performing Technologist: Cordella Collet RVT  Examination Guidelines: A complete evaluation includes B-mode imaging, spectral Doppler, color Doppler, and power Doppler as needed of all accessible portions of each vessel.  Bilateral testing is considered an integral part of a complete examination. Limited examinations for reoccurring indications may be performed as noted. +---------------+-----------+----------------------+---------------+-----------+   RT Diameter  RT Findings         GSV            LT Diameter  LT Findings      (cm)                                            (cm)                  +---------------+-----------+----------------------+---------------+-----------+      0.72                     Saphenofemoral         0.78                                                   Junction                                  +---------------+-----------+----------------------+---------------+-----------+      0.30                     Proximal thigh         0.26       branching  +---------------+-----------+----------------------+---------------+-----------+      0.18       branching       Mid thigh            0.31                  +---------------+-----------+----------------------+---------------+-----------+      0.10                      Distal thigh          0.12                  +---------------+-----------+----------------------+---------------+-----------+      0.14                          Knee              0.06                  +---------------+-----------+----------------------+---------------+-----------+      0.10       branching       Prox calf            0.10                  +---------------+-----------+----------------------+---------------+-----------+      0.07       branching        Mid calf            0.09       branching   +---------------+-----------+----------------------+---------------+-----------+      0.08                      Distal calf           0.06                  +---------------+-----------+----------------------+---------------+-----------+  Diagnosing physician: Debby Robertson Electronically signed by Debby Robertson on 04/25/2024 at 9:09:27 PM.    Final     ASSESSMENT/PLAN:  Assessment: Principal Problem:   Critical limb ischemia of right lower extremity ([name]) Active Problems:   CHF with left ventricular diastolic dysfunction, NYHA class 1 ([name])   Type 2 diabetes mellitus with chronic kidney disease on chronic dialysis, with long-term current use of insulin  ([name])   ESRD on hemodialysis ([name])   Hypertension   Anemia due to chronic blood loss   Peripheral vascular disease, unspecified ([name]) critical stenosis lower extremity   Angiodysplasia of small intestine  JULIENE KIRSH is a 83 y.o. 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  Plan: #Right Lower Extremity Critical Limb Ischemia #RLE wound - Patient's DP/PT pulses on the right lower extremity were not palpable at [facility] ED. She was transferred to this facility for vascular surgery evaluation for ischemia. Doppler at admission w/ flow in bilateral popliteal pulses and left posterior tibial pulse. Right sided DP, PT, and left sided DP were not found. Bilateral lower extremities below the knee are cool to the touch - PT and OT on board  -Wound care consulted -Vascular surgery recommended DAPT therapy to optimize patient. Aspirin  and Plavix started yesterday . Will  follow up in 2-3 weeks outpatient with Dr. Lanis  -Cephalexin 500 mg BID for 7 days ( on day 2) was started for cellulitis component of foot wound  #Presyncope - Patient has endorsed dizziness and lightheadedness that has been happening for awhile. She has exertional dyspnea and lightheaded and dizziness when standing from a seated position - Patient had signs of vascular congestion and bilateral pleural effusions on CT and CXR. NT pro BNP was 111K  - EKG at [facility] showed sinus rhythm with marked sinus arhythmia. Repeat EKG ordered - orthostatics ordered for today   #Duodenal AVMs w/ recent GI bleed - Patient has history of chronic GI bleeds and blood transfusions  - Patient presented with weakness and lightheadedness - Hgb at [facility] ED was 9.3. Hgb is stable at 9.2  - Monitor H/H and notify MD/DO for signs of melena and hematochezia in the setting of DAPT  - continue protonix  40 mg BID   #ESRD on HD ( TThS)  - Last HD was 7/1. Patient's blood pressure dropped into the 80s for SBP during dialysis.  - continue phosphorous binders per nephrology  - Will continue regular dialysis per her TThS schedule -CBC and renal function panel  #Contact Dermatitis  - Patient reported burning and stinging around the vulva region into the perianal region. Patient reports that this started when she was in the [facility] ED and they gave her a certain underwear to wear.  - most likely contact dermatitis due to erythema, but no other lesions noted - hydrocortisone  cream that is being used for patient's foot wound can also be used for this region. Sitz baths. Patient's RN informed  #HFpEF - CXR and CT showed signs of vascular congestion and bilateral pleural effusions - Patient went to dialysis yesterday but was not able to tolerate the whole session due to hypotension - lasix  80 mg continue -Monitor I&Os  - Will get standing weight   #  Type 2 Diabetes Mellitus -Home medications include Soliqua  6  units each morning  - SSI is in place -last A1c was 5.8 on 5/21  #Hypertension  - Blood pressure has been soft since admission - Holding carvedilol  6.25 mg Bid ( home med) in the setting of lower blood pressure   Best Practice: Diet: Renal diet IVF: Fluids: none VTE: heparin  injection 5,000 Units Start: 04/24/24 2200 Code: Full  Disposition planning: Therapy Recs: PT following Family Contact: to be notified. DISPO: Anticipated discharge  to Home pending clinical improvement of volume status.  Signature:  Joffre Lucks D'Mello [facility]  6:16 AM, 04/26/2024  On Call pager [phone]

## 2024-04-26 NOTE — Progress Notes (Signed)
 Nephrology Follow-Up Consult note   Assessment/Recommendations: Anna Carr is a/an 83 y.o. female with a past medical history significant for ESRD, HTN, CHF, GI bleeding, PAD, COPD, DM 2, admitted for ischemic right foot.       Dialysis Orders:  TTS - [facility] 3:30hr, 400/A1.5, EDW 71kg, 2K2/5Ca bath, UFP #2, AVG. no heparin    Assessment/Plan:  Ischemic R foot: cardiology and vascular surgery involved.  Options are fairly limited due to the patient's difficulty tolerating antiplatelet therapy because of GI bleeding.  Follow team's recommendations  ESRD: Continue HD on usual TTS schedule   Hypertension/volume: BP ok, but with edema. ^ UFG as tolerated.  Anemia, recent GIB: Hgb 9.2 - better than prior. Continue to monitor for now  Metabolic bone disease: Ca ok, Phso high - resumed home binders.  T2DM  HFpEF   Recommendations conveyed to primary service.    [name] JINNY [facility] 04/26/2024 12:14 PM  ___________________________________________________________  CC: right lower extremity wound  Interval History/Subjective: patient states she is not sure how she is doing today because she just woke up.  Tolerated dialysis yesterday okay.  Was able to get 2.6 L off.   Medications:  Current Facility-Administered Medications  Medication Dose Route Frequency Provider Last Rate Last Admin   acetaminophen  (TYLENOL ) tablet 1,000 mg  1,000 mg Oral Q8H PRN Jolaine Pac, DO   1,000 mg at 04/25/24 1140   artificial tears ophthalmic solution 1 drop  1 drop Both Eyes TID PRN Jolaine Pac, DO       aspirin  EC tablet 81 mg  81 mg Oral Daily Jolaine Pac, DO   81 mg at 04/26/24 1014   cephALEXin (KEFLEX) capsule 500 mg  500 mg Oral Q12H D'Mello, Rosalyn, DO   500 mg at 04/26/24 1014   Chlorhexidine  Gluconate Cloth 2 % PADS 6 each  6 each Topical Q0600 Stovall, Kathryn R, PA-C   6 each at 04/26/24 9462   clopidogrel (PLAVIX) tablet 75 mg  75 mg Oral Daily  D'Mello, Rosalyn, DO   75 mg at 04/26/24 1013   cyanocobalamin (VITAMIN B12) tablet 1,000 mcg  1,000 mcg Oral QPM Jolaine Pac, DO   1,000 mcg at 04/25/24 1758   ferric citrate  (AURYXIA ) tablet 210 mg  210 mg Oral TID WC Zheng, Michael, DO   210 mg at 04/26/24 1014   fluticasone  (FLONASE ) 50 MCG/ACT nasal spray 1 spray  1 spray Each Nare Daily PRN Jolaine Pac, DO       furosemide  (LASIX ) tablet 80 mg  80 mg Oral Q1500 Jolaine Pac, DO   80 mg at 04/25/24 1614   heparin  injection 5,000 Units  5,000 Units Subcutaneous Q8H Jolaine Pac, DO   5,000 Units at 04/26/24 0536   hydrocortisone  1 % ointment 1 Application  1 Application Topical BID Jolaine Pac, DO   1 Application at 04/26/24 1014   insulin  aspart (novoLOG ) injection 0-6 Units  0-6 Units Subcutaneous TID WC Zheng, Michael, DO       Muscle Rub CREA 1 Application  1 Application Topical BID PRN Jolaine Pac, DO       pantoprazole  (PROTONIX ) EC tablet 40 mg  40 mg Oral BID Jolaine Pac, DO   40 mg at 04/26/24 1013   polyethylene glycol (MIRALAX  / GLYCOLAX ) packet 17 g  17 g Oral BID PRN Jolaine Pac, DO       rosuvastatin (CRESTOR) tablet 10 mg  10 mg Oral Daily D'Mello, Rosalyn, DO   10 mg  at 04/26/24 1013   senna (SENOKOT) tablet 8.6 mg  1 tablet Oral Daily PRN Jolaine Pac, DO       Vitamin E CAPS 400 Units  400 Units Oral q AM Jolaine Pac, DO   400 Units at 04/26/24 1013      Review of Systems: 10 systems reviewed and negative except per interval history/subjective  Physical Exam: Vitals:   04/26/24 0354 04/26/24 0803  BP: (!) 112/42 (!) 128/56  Pulse: 60 70  Resp: 18 19  Temp: 97.6 F (36.4 C)   SpO2: 95% 93%   Total I/O In: 360 [P.O.:360] Out: -   Intake/Output Summary (Last 24 hours) at 04/26/2024 1214 Last data filed at 04/26/2024 0946 Gross per 24 hour  Intake 360 ml  Output 2600 ml  Net -2240 ml   Constitutional: well-appearing, no acute distress ENMT: ears and nose without scars or  lesions, MMM CV: normal rate, bilateral lower extremity edema Respiratory: clear to auscultation, normal work of breathing Gastrointestinal: soft, non-tender, no palpable masses or hernias Skin: right lower extremity with wounds and erythema, otherwiseno visible lesions or rashes Psych: alert, judgement/insight appropriate, appropriate mood and affect   Test Results I personally reviewed new and old clinical labs and radiology tests Lab Results  Component Value Date   NA 130 (L) 04/26/2024   K 4.8 04/26/2024   CL 89 (L) 04/26/2024   CO2 24 04/26/2024   BUN 46 (H) 04/26/2024   CREATININE 5.35 (H) 04/26/2024   CALCIUM  9.1 04/26/2024   ALBUMIN  2.8 (L) 04/26/2024   PHOS 6.3 (H) 04/26/2024    CBC Recent Labs  Lab 04/24/24 2145 04/25/24 0900 04/26/24 0820  WBC 6.6 6.5 7.3  NEUTROABS 5.1  --   --   HGB 8.7* 9.2* 9.2*  HCT 28.4* 29.3* 28.9*  MCV 102.9* 101.7* 101.0*  PLT 179 187 200

## 2024-04-26 NOTE — Progress Notes (Signed)
 Pt receives out-pt HD at [facility] on TTS 11:00 am chair time. Will assist as needed.   Lauraine Polite Renal Navigator [phone]

## 2024-04-26 NOTE — Plan of Care (Signed)

## 2024-04-26 NOTE — Progress Notes (Addendum)
 Progress Note    04/26/2024 8:41 AM * No surgery found *  Subjective:  no complaints this morning   Vitals:   04/26/24 0354 04/26/24 0803  BP: (!) 112/42 (!) 128/56  Pulse: 60 70  Resp: 18 19  Temp: 97.6 F (36.4 C)   SpO2: 95% 93%   Physical Exam: Lungs:  non labored Extremities:  RLE edema; erythema toes R foot and ankle Neurologic: A&O  CBC    Component Value Date/Time   WBC 6.5 04/25/2024 0900   RBC 2.88 (L) 04/25/2024 0900   HGB 9.2 (L) 04/25/2024 0900   HGB 11.3 01/12/2024 1259   HCT 29.3 (L) 04/25/2024 0900   HCT 35.0 01/12/2024 1259   PLT 187 04/25/2024 0900   PLT 208 01/12/2024 1259   MCV 101.7 (H) 04/25/2024 0900   MCV 99 (H) 01/12/2024 1259   MCH 31.9 04/25/2024 0900   MCHC 31.4 04/25/2024 0900   RDW 19.1 (H) 04/25/2024 0900   RDW 15.7 (H) 01/12/2024 1259   LYMPHSABS 0.9 04/24/2024 2145   LYMPHSABS 0.9 01/12/2024 1259   MONOABS 0.4 04/24/2024 2145   EOSABS 0.1 04/24/2024 2145   EOSABS 0.1 01/12/2024 1259   BASOSABS 0.1 04/24/2024 2145   BASOSABS 0.0 01/12/2024 1259    BMET    Component Value Date/Time   NA 131 (L) 04/25/2024 0900   NA 134 01/12/2024 1259   K 5.0 04/25/2024 0900   CL 90 (L) 04/25/2024 0900   CO2 24 04/25/2024 0900   GLUCOSE 88 04/25/2024 0900   BUN 48 (H) 04/25/2024 0900   BUN 31 (H) 01/12/2024 1259   CREATININE 6.03 (H) 04/25/2024 0900   CALCIUM  8.8 (L) 04/25/2024 0900   GFRNONAA 7 (L) 04/25/2024 0900    INR    Component Value Date/Time   INR 1.3 (H) 04/10/2024 1106     Intake/Output Summary (Last 24 hours) at 04/26/2024 0841 Last data filed at 04/25/2024 2355 Gross per 24 hour  Intake --  Output 2600 ml  Net -2600 ml     Assessment/Plan:  83 y.o. female with RLE wounds  R foot is warm to touch however she has pain with light touch.  Vein mapping shows inadequate GSV for bypass conduit use.  Dr. Court believes the patient is very high risk for open surgery.  The patient and daughter again express their  concerns with surgery.  Continue antibiotics for cellulitic picture.  Elevate the legs when in bed to limit edema.  Continue aspirin  and plavix if able; CBC not collected this morning.  Dr. Lanis plans to re-assess her in the outpatient seeting in 2-3 weeks     Donnice Sender, PA-C [facility] [phone] 04/26/2024 8:41 AM   VASCULAR STAFF ADDENDUM:  I have independently interviewed and examined the patient. I agree with the above.  I have discussed her case with multiple partners.  Unfortunately, she does not have usable greater saphenous vein.  Furthermore, she is very high risk for any intervention. With her comorbidities, and inability to tolerate anticoagulation, I think the better part of valor is continuing medical management at this time with plans to have short order follow-up in clinic.  I had a long conversation with Callaway and her daughter regarding the above. I do not think that a plastic bypass would have any durability, and would likely leave her susceptible to infection.   I am concerned about the significant swelling in the bilateral lower extremities.  I would appreciate any and all  efforts to remove this either via dialysis, Lasix , or other.  I think some of the small ulcerations actually occurred due to the heart failure exacerbation and swelling which occurred several weeks ago.  We discussed the importance of elevation.  I do not want her wearing compression as this would further decrease perfusion to the feet.  I have scheduled for follow-up. Please place her on aspirin , Plavix with plans to see her primary in 1 week's time for CBC. Will continue our in-depth discussion regarding the risks and benefits of surgical intervention in the outpatient setting.  I appreciate the primary team's care.  Please call if questions or concerns arise.  Fonda FORBES Rim MD [facility] Phone Number: [phone] 04/26/2024  5:28 PM

## 2024-04-26 NOTE — Plan of Care (Signed)
   Problem: Education: Goal: Knowledge of General Education information will improve Description Including pain rating scale, medication(s)/side effects and non-pharmacologic comfort measures Outcome: Progressing

## 2024-04-26 NOTE — Progress Notes (Signed)
 Mobility Specialist: Progress Note   04/26/24 1444  Orthostatic Sitting  BP- Sitting 114/45  Pulse- Sitting 68  Orthostatic Standing at 0 minutes  BP- Standing at 0 minutes (!) 105/38  Pulse- Standing at 0 minutes 67  Orthostatic Standing at 3 minutes  BP- Standing at 3 minutes 116/63  Pulse- Standing at 3 minutes 74  Mobility  Activity Ambulated with assistance in room  Level of Assistance Contact guard assist, steadying assist  Assistive Device Front wheel walker  Distance Ambulated (ft) 15 ft  Activity Response Tolerated well  Mobility Referral Yes  Mobility visit 1 Mobility  Mobility Specialist Start Time (ACUTE ONLY) 1048  Mobility Specialist Stop Time (ACUTE ONLY) 1101  Mobility Specialist Time Calculation (min) (ACUTE ONLY) 13 min    Pt received in chair, agreeable to mobility session. CG throughout. Denies dizziness or lightheadedness, but c/o feeling weak and shaky. Ambulated a short distance in room and returned to the chair needing to sit down. Left in chair with all needs met, call bell in reach> Daughter present.   Ileana Lute Mobility Specialist Please contact via SecureChat or Rehab office at [phone]

## 2024-04-27 LAB — CBC
HCT: 28 % — ABNORMAL LOW (ref 36.0–46.0)
Hemoglobin: 8.9 g/dL — ABNORMAL LOW (ref 12.0–15.0)
MCH: 32.1 pg (ref 26.0–34.0)
MCHC: 31.8 g/dL (ref 30.0–36.0)
MCV: 101.1 fL — ABNORMAL HIGH (ref 80.0–100.0)
Platelets: 209 10*3/uL (ref 150–400)
RBC: 2.77 MIL/uL — ABNORMAL LOW (ref 3.87–5.11)
RDW: 18.8 % — ABNORMAL HIGH (ref 11.5–15.5)
WBC: 7.7 10*3/uL (ref 4.0–10.5)
nRBC: 0 % (ref 0.0–0.2)

## 2024-04-27 LAB — IRON AND TIBC
Iron: 73 ug/dL (ref 28–170)
Saturation Ratios: 26 % (ref 10.4–31.8)
TIBC: 283 ug/dL (ref 250–450)
UIBC: 210 ug/dL

## 2024-04-27 LAB — BASIC METABOLIC PANEL WITH GFR
Anion gap: 18 — ABNORMAL HIGH (ref 5–15)
Anion gap: 20 — ABNORMAL HIGH (ref 5–15)
BUN: 61 mg/dL — ABNORMAL HIGH (ref 8–23)
BUN: 28 mg/dL — ABNORMAL HIGH (ref 8–23)
CO2: 21 mmol/L — ABNORMAL LOW (ref 22–32)
CO2: 21 mmol/L — ABNORMAL LOW (ref 22–32)
Calcium: 8.8 mg/dL — ABNORMAL LOW (ref 8.9–10.3)
Calcium: 9 mg/dL (ref 8.9–10.3)
Chloride: 87 mmol/L — ABNORMAL LOW (ref 98–111)
Chloride: 91 mmol/L — ABNORMAL LOW (ref 98–111)
Creatinine, Ser: 6.12 mg/dL — ABNORMAL HIGH (ref 0.44–1.00)
Creatinine, Ser: 3.67 mg/dL — ABNORMAL HIGH (ref 0.44–1.00)
GFR, Estimated: 6 mL/min — ABNORMAL LOW (ref >60)
GFR, Estimated: 12 mL/min — ABNORMAL LOW (ref >60)
Glucose, Bld: 166 mg/dL — ABNORMAL HIGH (ref 70–99)
Glucose, Bld: 124 mg/dL — ABNORMAL HIGH (ref 70–99)
Potassium: 5.1 mmol/L (ref 3.5–5.1)
Potassium: 4.1 mmol/L (ref 3.5–5.1)
Sodium: 126 mmol/L — ABNORMAL LOW (ref 135–145)
Sodium: 132 mmol/L — ABNORMAL LOW (ref 135–145)

## 2024-04-27 LAB — GLUCOSE, CAPILLARY
Glucose-Capillary: 119 mg/dL — ABNORMAL HIGH (ref 70–99)
Glucose-Capillary: 226 mg/dL — ABNORMAL HIGH (ref 70–99)

## 2024-04-27 LAB — FERRITIN: Ferritin: 1188 ng/mL — ABNORMAL HIGH (ref 11–307)

## 2024-04-27 MED ORDER — ALBUMIN HUMAN 25 % IV SOLN
25.0000 g | Freq: Once | INTRAVENOUS | Status: AC
  Administered 2024-04-27: 25 g via INTRAVENOUS

## 2024-04-27 MED ORDER — LIDOCAINE-PRILOCAINE 2.5-2.5 % EX CREA: 1.0000 | TOPICAL_CREAM | CUTANEOUS | Status: DC | PRN

## 2024-04-27 MED ORDER — PENTAFLUOROPROP-TETRAFLUOROETH EX AERO: 1.0000 | INHALATION_SPRAY | CUTANEOUS | Status: DC | PRN

## 2024-04-27 MED ORDER — LIDOCAINE HCL (PF) 1 % IJ SOLN
5.0000 mL | INTRAMUSCULAR | Status: DC | PRN
Start: 2024-04-27 — End: 2024-04-27

## 2024-04-27 MED ORDER — BISACODYL 10 MG RE SUPP
10.0000 mg | Freq: Once | RECTAL | Status: AC
  Administered 2024-04-27: 10 mg via RECTAL
  Filled 2024-04-27 (×2): qty 1

## 2024-04-27 MED ORDER — POLYETHYLENE GLYCOL 3350 17 G PO PACK
17.0000 g | PACK | Freq: Every day | ORAL | Status: AC
  Administered 2024-04-27 – 2024-04-28 (×2): 17 g via ORAL
  Filled 2024-04-27 (×2): qty 1

## 2024-04-27 MED ORDER — ANTICOAGULANT SODIUM CITRATE 4% (200MG/5ML) IV SOLN: 5.0000 mL | Status: DC | PRN

## 2024-04-27 MED ORDER — SENNA 8.6 MG PO TABS
1.0000 | ORAL_TABLET | Freq: Every day | ORAL | Status: DC
  Administered 2024-04-28: 8.6 mg via ORAL
  Filled 2024-04-27 (×2): qty 1

## 2024-04-27 MED ORDER — INSULIN GLARGINE-YFGN 100 UNIT/ML ~~LOC~~ SOLN
4.0000 [IU] | Freq: Every day | SUBCUTANEOUS | Status: DC
  Filled 2024-04-27: qty 0.04

## 2024-04-27 MED ORDER — ALTEPLASE 2 MG IJ SOLR: 2.0000 mg | Freq: Once | INTRAMUSCULAR | Status: DC | PRN

## 2024-04-27 MED ORDER — HEPARIN SODIUM (PORCINE) 1000 UNIT/ML DIALYSIS: 1000.0000 [IU] | INTRAMUSCULAR | Status: DC | PRN

## 2024-04-27 MED ORDER — ALBUMIN HUMAN 25 % IV SOLN
INTRAVENOUS | Status: AC
  Filled 2024-04-27: qty 100

## 2024-04-27 NOTE — Plan of Care (Signed)

## 2024-04-27 NOTE — Progress Notes (Signed)
 Physical Therapy Treatment Patient Details Name: Anna Carr MRN: [id number] DOB: November 04, 1940 Today's Date: 04/27/2024   History of Present Illness Patient is an 83 y/o female admitted 04/24/24 due to generalized weakness, dyspnea and R LE wound.  Found to have elevated BNP, B pleural effusions, and concern for absent pulses R DP and PT and for vascular evaluation.  PMH positive for recent admissions 5/8-5/19 and 6/23-6/25 with GIB and symptomatic anemia, ESRD on HD TTS, PAD, HFpEF, DM, HTN, anemia and h/o falls.    PT Comments  The pt is making good functional progress, now ambulating an increased distance of up to ~60 ft with a RW and supervision before fatiguing. She fatigues quickly and needed several rest breaks between sets of lower extremity exercises as well. Educated pt on managing her CHF and edema in her legs by reducing sodium/processed foods intake, monitoring fluid intake, weighing self daily, elevating legs and performing ankle pumps when at rest. Educated pt to monitor her symptoms of lightheadedness to know when to stop and sit or lay down to maintain her safety and to change positions slowly. She verbalized understanding. Will continue to follow acutely.     If plan is discharge home, recommend the following: A little help with walking and/or transfers;A little help with bathing/dressing/bathroom;Assistance with cooking/housework;Direct supervision/assist for medications management;Direct supervision/assist for financial management;Assist for transportation;Help with stairs or ramp for entrance   Can travel by private vehicle        Equipment Recommendations  None recommended by PT    Recommendations for Other Services       Precautions / Restrictions Precautions Precautions: Fall Recall of Precautions/Restrictions: Intact Precaution/Restrictions Comments: BP, HR, SpO2 Restrictions Weight Bearing Restrictions Per Provider Order: No     Mobility  Bed Mobility                General bed mobility comments: Pt standing in bathroom with RN upon arrival, sitting in recliner end of session    Transfers Overall transfer level: Needs assistance Equipment used: None Transfers: Sit to/from Stand Sit to Stand: Supervision           General transfer comment: Pt performed x4 serial sit <> stand reps from recliner with hands on chest, extra time to power up to stand and gain balance, supervision for safety    Ambulation/Gait Ambulation/Gait assistance: Supervision Gait Distance (Feet): 60 Feet Assistive device: Rolling walker (2 wheels) Gait Pattern/deviations: Step-through pattern, Decreased stride length Gait velocity: reduced Gait velocity interpretation: <1.31 ft/sec, indicative of household ambulator   General Gait Details: Pt ambulated around the room and in the hall with slow, step-through gait pattern. No LOB noted, supervision for safety   Stairs             Wheelchair Mobility     Tilt Bed    Modified Rankin (Stroke Patients Only)       Balance Overall balance assessment: Needs assistance Sitting-balance support: Feet supported Sitting balance-Leahy Scale: Good     Standing balance support: No upper extremity supported, During functional activity, Bilateral upper extremity supported Standing balance-Leahy Scale: Fair Standing balance comment: able to stand statically without UE support, uses RW to ambulate                            Communication Communication Communication: Impaired Factors Affecting Communication: Hearing impaired  Cognition Arousal: Alert Behavior During Therapy: WFL for tasks assessed/performed   PT - Cognitive impairments:  No apparent impairments                         Following commands: Intact      Cueing Cueing Techniques: Verbal cues  Exercises Other Exercises Other Exercises: serial sit <> stand reps with arms crossed at chest, x4 reps, supervision for  safety Other Exercises: seated bil hip abudction/adduction x5 reps Other Exercises: seated marching bil x5 reps    General Comments General comments (skin integrity, edema, etc.): pt already mobilizing in room with RN upon arrival, thus unable to get BP measures with positional changes; educated pt on managing her CHF and edema in her legs by reducing sodium/processed foods intake, monitoring fluid intake, weighing self daily, elevating legs and performing ankle pumps when at rest; educated pt to monitor her symptoms of lightheadedness to know when to stop and sit or lay down to maintain her safety and to change positions slowly; she verbalized understanding      Pertinent Vitals/Pain Pain Assessment Pain Assessment: Faces Faces Pain Scale: Hurts little more Pain Location: R leg Pain Descriptors / Indicators: Discomfort Pain Intervention(s): Limited activity within patient's tolerance, Monitored during session, Repositioned    Home Living                          Prior Function            PT Goals (current goals can now be found in the care plan section) Acute Rehab PT Goals Patient Stated Goal: get back to independent PT Goal Formulation: With patient Time For Goal Achievement: 05/09/24 Potential to Achieve Goals: Good Progress towards PT goals: Progressing toward goals    Frequency    Min 2X/week      PT Plan      Co-evaluation              AM-PAC PT 6 Clicks Mobility   Outcome Measure  Help needed turning from your back to your side while in a flat bed without using bedrails?: None Help needed moving from lying on your back to sitting on the side of a flat bed without using bedrails?: None Help needed moving to and from a bed to a chair (including a wheelchair)?: A Little Help needed standing up from a chair using your arms (e.g., wheelchair or bedside chair)?: None Help needed to walk in hospital room?: A Little Help needed climbing 3-5 steps  with a railing? : A Little 6 Click Score: 21    End of Session   Activity Tolerance: Patient tolerated treatment well Patient left: in chair;with call bell/phone within reach Nurse Communication: Mobility status;Other (comment) (pt report of difficulty having BM) PT Visit Diagnosis: Other abnormalities of gait and mobility (R26.89);Muscle weakness (generalized) (M62.81);Pain Pain - Right/Left: Right Pain - part of body: Ankle and joints of foot     Time: 9253-9241 PT Time Calculation (min) (ACUTE ONLY): 12 min  Charges:    $Therapeutic Activity: 8-22 mins PT General Charges $$ ACUTE PT VISIT: 1 Visit                     Theo Ferretti, PT, DPT Acute Rehabilitation Services  Office: [phone]    Theo CHRISTELLA Ferretti 04/27/2024, 8:18 AM

## 2024-04-27 NOTE — Progress Notes (Addendum)
 HD#3 SUBJECTIVE:  Patient Summary: Anna Carr is a 83 y.o. 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   Overnight Events: No overnight events  Interim History: Patient was in dialysis today during rounds. We were able to see her at bedside. She reports that she feels like her breathing has stayed the same, but has chronically been getting worse over time. Patient also reports that she has not been able to have a bowel movement. When suggested that she use a suppository, patient said that she was open to this. Patient has not noticed any bleeding symptoms and her hemoglobin has been stable. Patient reports that burning and itching at her vulva are better when the hydrocortisone  cream is put on it. No other complaints at this time.    OBJECTIVE:  Vital Signs: Vitals:   04/27/24 1237 04/27/24 1240 04/27/24 1323 04/27/24 1551  BP: (!) 127/47 105/69 (!) 119/53 (!) 112/28  Pulse: 79 78 81 77  Resp: (!) 23 16 19 18   Temp:  97.9 F (36.6 C) 97.9 F (36.6 C) 97.8 F (36.6 C)  TempSrc:      SpO2: 98% 94% 93% 95%  Weight:  72.1 kg    Height:       Supplemental O2: Room Air SpO2: 95 % O2 Flow Rate (L/min): 3 L/min  Filed Weights   04/27/24 0451 04/27/24 0902 04/27/24 1240  Weight: 76.6 kg 74.1 kg 72.1 kg     Intake/Output Summary (Last 24 hours) at 04/27/2024 1556 Last data filed at 04/27/2024 1240 Gross per 24 hour  Intake --  Output 3500 ml  Net -3500 ml   Net IO Since Admission: -5,740 mL [04/27/24 1556]  Physical Exam: Physical Exam Cardiovascular:     Rate and Rhythm: Normal rate and regular  rhythm.     Heart sounds: Normal heart sounds.  Pulmonary:     Effort: Pulmonary effort is normal.     Breath sounds: Rales present.     Comments: Occasional rales at lung base on right side Musculoskeletal:     Right lower leg: Edema present.     Left lower leg: Edema present.     Comments: 1+ edema on bilateral lower extremities 2+ right pedal edema   Skin:    Comments: Skin was cool to the touch on the right foot. Patient's wound on right foot were not oozing. Pedal edema 2+ was present on the right foot. Increased erythema around right foot.   Neurological:     Mental Status: She is alert.     Patient Lines/Drains/Airways Status     Active Line/Drains/Airways     Name Placement date Placement time Site Days   Peripheral IV 04/25/24 22 G Anterior;Right Antecubital 04/25/24  0358  Antecubital  1   Fistula / Graft Left Upper arm Arteriovenous vein graft 03/12/21  1254  Upper arm  1141   Wound / Incision (Open or Dehisced) 02/26/24 Other (Comment) Knee Right;Left;Anterior bilateral knee abrasion 02/26/24  1900  Knee  60   Wound 04/10/24 Other (Comment) Leg Right 04/10/24  --  Leg  16   Wound 04/12/24 0534 Other (Comment) Arm Lower;Posterior;Proximal;Right 04/12/24  0534  Arm  14   Wound  04/12/24 0536 Other (Comment) Thigh Posterior;Proximal;Right 04/12/24  0536  Thigh  14   Wound 04/12/24 0539 Other (Comment) Pretibial Distal;Right 04/12/24  0539  Pretibial  14   Wound 04/12/24 0541 Pressure Injury Heel Posterior;Right Stage 2 -  Partial thickness loss of dermis presenting as a shallow open injury with a red, pink wound bed without slough. 04/12/24  0541  Heel  14   Wound 04/12/24 0542 Pressure Injury Heel Left;Posterior Stage 1 -  Intact skin with non-blanchable redness of a localized area usually over a bony prominence. 04/12/24  0542  Heel  14   Wound 04/12/24 0543 Other (Comment) Toe (Comment  which one) Anterior;Right 04/12/24  0543  Toe (Comment  which one)  14   Wound 04/12/24  0544 Other (Comment) Toe (Comment  which one) Anterior;Right 04/12/24  0544  Toe (Comment  which one)  14   Wound 04/12/24 1322 Other (Comment) Arm Anterior;Left;Lower 04/12/24  1322  Arm  14            Pertinent labs and imaging:     Latest Ref Rng & Units 04/27/2024    2:50 AM 04/26/2024    8:20 AM 04/25/2024    9:00 AM  CBC  WBC 4.0 - 10.5 K/uL 7.7  7.3  6.5   Hemoglobin 12.0 - 15.0 g/dL 8.9  9.2  9.2   Hematocrit 36.0 - 46.0 % 28.0  28.9  29.3   Platelets 150 - 400 K/uL 209  200  187        Latest Ref Rng & Units 04/27/2024    2:20 PM 04/27/2024    2:50 AM 04/26/2024    8:20 AM  CMP  Glucose 70 - 99 mg/dL 875  833  69   BUN 8 - 23 mg/dL 28  61  46   Creatinine 0.44 - 1.00 mg/dL 6.32  3.87  4.64   Sodium 135 - 145 mmol/L 132  126  130   Potassium 3.5 - 5.1 mmol/L 4.1  5.1  4.8   Chloride 98 - 111 mmol/L 91  87  89   CO2 22 - 32 mmol/L 21  21  24    Calcium  8.9 - 10.3 mg/dL 9.0  8.8  9.1     No results found.   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  Plan: #Right  Lower Extremity Critical Limb Ischemia #RLE wound #Volume overload - Patient's DP/PT pulses on the right lower extremity were not palpable at [facility] ED. She was transferred to this facility for vascular surgery evaluation for critical limb ischemia. Doppler at admission showed bilateral popliteal pulses and left posterior tibial pulse. Right sided DP, PT,  and left sided DP were not found.  - PT recommends home health -Wound care consulted -Vascular surgery recommends DAPT therapy to optimize patient. Continue DAPT therapy unless there are signs of bleeding . Will follow up in 2-3 weeks outpatient with Dr. Lanis  -Cephalexin 500 mg BID for cellulitis for 7 days  -Volume management per HD given minimal UOP; continuing oral furosemide   #Presyncope - Patient has endorsed dizziness and lightheadedness that has been happening for awhile. She has exertional dyspnea and lightheaded and dizziness when standing from a seated position. Could be due to deconditioning from multiple hospital stays - Patient had signs of vascular congestion and bilateral pleural effusions on CT and CXR.  - Orthostatic vitals were 114/45 sitting, 105/38 standing and 116/63 after standing for 3 minutes - EKG at [facility] showed sinus rhythm with marked sinus arhythmia. Repeat EKG ordered   #Duodenal AVMs w/ recent GI bleed - Patient has history of chronic GI bleeds and blood transfusions  -- Hgb at [facility] ED was 9.3. Hgb is stable at 8.9 - Monitor H/H and notify MD/DO for signs of melena and hematochezia in the setting of DAPT  -continue Protonix  40 mg BID   #ESRD on HD ( [facility]ThS)  - patient had hemodialysis today and 3.5 L was pulled - continue phosphorous binders per nephrology  - Will continue regular dialysis per her TThS schedule - monitor CBC and renal function panel  #Macrocytic Anemia -Iron  studies are iron  73, TIBC 283, Ferritin 1,188 - Patient could have mixed components of anemia of chronic disease and  anemia from chronic kidney disease -Watch for bleeding symptoms as patient has history of GI bleeds - Transfuse if <8, in the setting of heart failure   #Contact Dermatitis  - Patient reported burning and stinging around the vulva region into the perianal region.This started when patient was given underwear to wear in the [facility] ED - most likely contact dermatitis due to erythema, but no other lesions noted - Sitz baths and hydrocortisone  cream   #HFpEF - CXR and CT showed signs of vascular congestion and bilateral pleural effusions - Patient went to dialysis today and were able to pull off 3.5 L  and tolerated well - lasix  80 mg continue -Monitor I&Os   #Type 2 Diabetes Mellitus -Home medications include Soliqua  6 units each morning  - SSI is in place -last A1c was 5.8 on 5/21  #Hypertension  - Blood pressure has been soft since admission - Holding carvedilol  6.25 mg Bid ( home med) in the setting of lower blood pressure  #constipation  - Patient had a bowel movement yesterday but said it was hard to pass. Today she is in a lot of pain from not being able to defecate. She is passing gas - Scheduled senna, miralax , and enema for patient   Best Practice: Diet: Renal diet IVF: Fluids: none VTE: heparin  injection 5,000 Units Start: 04/24/24 2200 Code: Full  Disposition planning: Therapy Recs: PT following Family Contact: to be notified. DISPO: Anticipated discharge  to Home pending clinical improvement of volume status and bowel movement.  Signature:  Tenecia Ignasiak [facility]  3:56 PM, 04/27/2024  On Call pager [phone]

## 2024-04-27 NOTE — Progress Notes (Signed)
 Nephrology Follow-Up Consult note   Assessment/Recommendations: Anna Carr is a/an 83 y.o. female with a past medical history significant for ESRD, HTN, CHF, GI bleeding, PAD, COPD, DM 2, admitted for ischemic right foot.       Dialysis Orders:  TTS - [place] 3:30hr, 400/A1.5, EDW 71kg, 2K2/5Ca bath, UFP #2, AVG. no heparin    Assessment/Plan:  Ischemic R foot: cardiology and vascular surgery involved.  Options are fairly limited due to the patient's difficulty tolerating antiplatelet therapy because of GI bleeding.  Follow team's recommendations.  We are trying to get as much fluid off her body as possible with dialysis  ESRD: Continue HD on usual TTS schedule   Hypertension/volume: BP ok, but with edema. ^ UFG as tolerated.  Sometimes limited by low blood pressures  Anemia, recent GIB: Hgb 8-9.  Some dilution.  Ferritin high in the past.  Obtain iron  studies consider ESA  Metabolic bone disease: Ca ok, Phso high - resumed home binders.  T2DM  HFpEF   Recommendations conveyed to primary service.    [name] JINNY Player [facility] 04/27/2024 9:22 AM  ___________________________________________________________  CC: right lower extremity wound  Interval History/Subjective: States she is constipated.  Also wants to go home.   Medications:  Current Facility-Administered Medications  Medication Dose Route Frequency Provider Last Rate Last Admin   acetaminophen  (TYLENOL ) tablet 1,000 mg  1,000 mg Oral Q8H PRN Jolaine Pac, DO   1,000 mg at 04/25/24 1140   alteplase  (CATHFLO ACTIVASE ) injection 2 mg  2 mg Intracatheter Once PRN Stovall, Kathryn R, PA-C       anticoagulant sodium citrate  solution 5 mL  5 mL Intracatheter PRN Stovall, Kathryn R, PA-C       artificial tears ophthalmic solution 1 drop  1 drop Both Eyes TID PRN Jolaine Pac, DO       aspirin  EC tablet 81 mg  81 mg Oral Daily Jolaine Pac, DO   81 mg at 04/26/24 1014   cephALEXin (KEFLEX)  capsule 500 mg  500 mg Oral Q12H D'Mello, Rosalyn, DO   500 mg at 04/26/24 2130   Chlorhexidine  Gluconate Cloth 2 % PADS 6 each  6 each Topical Q0600 Stovall, Kathryn R, PA-C   6 each at 04/27/24 0531   clopidogrel (PLAVIX) tablet 75 mg  75 mg Oral Daily D'Mello, Rosalyn, DO   75 mg at 04/26/24 1013   cyanocobalamin (VITAMIN B12) tablet 1,000 mcg  1,000 mcg Oral QPM Jolaine Pac, DO   1,000 mcg at 04/26/24 1721   ferric citrate  (AURYXIA ) tablet 210 mg  210 mg Oral TID WC Zheng, Michael, DO   210 mg at 04/26/24 1721   fluticasone  (FLONASE ) 50 MCG/ACT nasal spray 1 spray  1 spray Each Nare Daily PRN Jolaine Pac, DO       furosemide  (LASIX ) tablet 80 mg  80 mg Oral Q1500 Jolaine Pac, DO   80 mg at 04/26/24 1621   heparin  injection 1,000 Units  1,000 Units Intracatheter PRN Stovall, Kathryn R, PA-C       heparin  injection 5,000 Units  5,000 Units Subcutaneous Q8H Jolaine Pac, DO   5,000 Units at 04/27/24 0532   hydrocortisone  1 % ointment 1 Application  1 Application Topical BID Jolaine Pac, DO   1 Application at 04/26/24 2130   insulin  aspart (novoLOG ) injection 0-6 Units  0-6 Units Subcutaneous TID WC Zheng, Michael, DO   3 Units at 04/26/24 1636   lidocaine  (PF) (XYLOCAINE ) 1 % injection 5 mL  5 mL Intradermal PRN Stovall, Kathryn R, PA-C       lidocaine -prilocaine  (EMLA ) cream 1 Application  1 Application Topical PRN Stovall, Kathryn R, PA-C       Muscle Rub CREA 1 Application  1 Application Topical BID PRN Jolaine Pac, DO       pantoprazole  (PROTONIX ) EC tablet 40 mg  40 mg Oral BID Jolaine Pac, DO   40 mg at 04/26/24 2130   pentafluoroprop-tetrafluoroeth (GEBAUERS) aerosol 1 Application  1 Application Topical PRN Stovall, Kathryn R, PA-C       polyethylene glycol (MIRALAX  / GLYCOLAX ) packet 17 g  17 g Oral BID PRN Jolaine Pac, DO   17 g at 04/26/24 1621   rosuvastatin (CRESTOR) tablet 10 mg  10 mg Oral Daily D'Mello, Rosalyn, DO   10 mg at 04/26/24 1013   senna  (SENOKOT) tablet 8.6 mg  1 tablet Oral Daily PRN Jolaine Pac, DO   8.6 mg at 04/26/24 1621   Vitamin E CAPS 400 Units  400 Units Oral q AM Jolaine Pac, DO   400 Units at 04/26/24 1013      Review of Systems: 10 systems reviewed and negative except per interval history/subjective  Physical Exam: Vitals:   04/27/24 0902 04/27/24 0905  BP: (!) 131/57 (!) 131/57  Pulse: 74 74  Resp:  (!) 21  Temp: 97.9 F (36.6 C) (!) 97.5 F (36.4 C)  SpO2:     No intake/output data recorded.  Intake/Output Summary (Last 24 hours) at 04/27/2024 9077 Last data filed at 04/26/2024 0946 Gross per 24 hour  Intake 360 ml  Output --  Net 360 ml   Constitutional: well-appearing, no acute distress ENMT: ears and nose without scars or lesions, MMM CV: normal rate, bilateral lower extremity edema Respiratory: Bilateral chest rise, normal work of breathing Gastrointestinal: soft, non-tender, no palpable masses or hernias Skin: right lower extremity with wounds and erythema, otherwise no visible lesions or rashes Psych: alert, judgement/insight appropriate, appropriate mood and affect   Test Results I personally reviewed new and old clinical labs and radiology tests Lab Results  Component Value Date   NA 126 (L) 04/27/2024   K 5.1 04/27/2024   CL 87 (L) 04/27/2024   CO2 21 (L) 04/27/2024   BUN 61 (H) 04/27/2024   CREATININE 6.12 (H) 04/27/2024   CALCIUM  8.8 (L) 04/27/2024   ALBUMIN  2.8 (L) 04/26/2024   PHOS 6.3 (H) 04/26/2024    CBC Recent Labs  Lab 04/24/24 2145 04/25/24 0900 04/26/24 0820 04/27/24 0250  WBC 6.6 6.5 7.3 7.7  NEUTROABS 5.1  --   --   --   HGB 8.7* 9.2* 9.2* 8.9*  HCT 28.4* 29.3* 28.9* 28.0*  MCV 102.9* 101.7* 101.0* 101.1*  PLT 179 187 200 209

## 2024-04-27 NOTE — Progress Notes (Signed)
 [facility] Population Health post TCM follow up ( 9 day follow up)  Has the patient seen their PCP or specialist?:  Yes Have any new problems been identified since hospitalization?:  No Does the patient have COPD and/or CHF?:  No Has the patient had any additional recent utilization (ED or IP in the 3 months prior to the current admission or ED/IP since discharge)?:  Yes Please explain the utilization:  admission 02/15/24 to 02/19/24 at [facility] for right sided low back pain with sciatica, unstable gait, generalized weakness admission 02/25/24 to 03/11/24 at [facility] for anemia admission 04/10/24 to 04/14/24 GI bleed  Does the patient require outreach?:  Yes  04/27/24 Day 9. HN called patient's daughter, [name], who is listed on hipaa # [phone].   She reports patient is back at [facility] since Monday. She had to let me go. HN will follow up once she gets out of the hospital.   Date of call: 04/27/24   Discharged from: [facility]   Updates/Changes since last encounter: HN called patient for 9 day follow up for most recent hospital stay for  Melena, hematochezia, symptomatic anemia. HN spoke with patient daughter, [name], who is listed on HIPAA form, briefly. She reports she is back at [facility] hospital since Monday.  HN will follow up with patient once discharged from hospital.   Jon Sharps, RN [facility] Navigator [phone]   Electronically signed by: Jon Earnie Sharps, RN 04/27/2024 1:36 PM

## 2024-04-27 NOTE — Progress Notes (Signed)
   04/27/24 1240  Vitals  Temp 97.9 F (36.6 C)  Pulse Rate 78  Resp 16  BP 105/69  SpO2 94 %  O2 Device Room Air  Weight 72.1 kg  Type of Weight Post-Dialysis  Oxygen Therapy  Patient Activity (if Appropriate) In bed  Pulse Oximetry Type Continuous  Post Treatment  Dialyzer Clearance Lightly streaked  Fluid Removed (mL) 3.5 mL  Tolerated HD Treatment Yes  Post-Hemodialysis Comments HD tolerated  AVG/AVF Arterial Site Held (minutes) 10 minutes  AVG/AVF Venous Site Held (minutes) 10 minutes   Received patient in bed to unit.  Alert and oriented.  Informed consent signed and in chart.    TX duration: 3.5hrs   Patient tolerated well.  Transported back to the room  Alert, without acute distress.  Hand-off given to patient's nurse.    Access used: L AVG Access issues: none   Total UF removed: 3.5L Medication(s) given: see eMAR  Woodfin LITTIE Dolores, RN  [facility]

## 2024-04-28 LAB — BASIC METABOLIC PANEL WITH GFR
Anion gap: 12 (ref 5–15)
BUN: 34 mg/dL — ABNORMAL HIGH (ref 8–23)
CO2: 24 mmol/L (ref 22–32)
Calcium: 8.7 mg/dL — ABNORMAL LOW (ref 8.9–10.3)
Chloride: 91 mmol/L — ABNORMAL LOW (ref 98–111)
Creatinine, Ser: 4.26 mg/dL — ABNORMAL HIGH (ref 0.44–1.00)
GFR, Estimated: 10 mL/min — ABNORMAL LOW (ref >60)
Glucose, Bld: 154 mg/dL — ABNORMAL HIGH (ref 70–99)
Potassium: 4 mmol/L (ref 3.5–5.1)
Sodium: 127 mmol/L — ABNORMAL LOW (ref 135–145)

## 2024-04-28 LAB — GLUCOSE, CAPILLARY
Glucose-Capillary: 214 mg/dL — ABNORMAL HIGH (ref 70–99)
Glucose-Capillary: 274 mg/dL — ABNORMAL HIGH (ref 70–99)
Glucose-Capillary: 250 mg/dL — ABNORMAL HIGH (ref 70–99)
Glucose-Capillary: 181 mg/dL — ABNORMAL HIGH (ref 70–99)

## 2024-04-28 LAB — HEMOGLOBIN AND HEMATOCRIT, BLOOD
HCT: 27.8 % — ABNORMAL LOW (ref 36.0–46.0)
Hemoglobin: 9.1 g/dL — ABNORMAL LOW (ref 12.0–15.0)

## 2024-04-28 LAB — CBC
HCT: 25.8 % — ABNORMAL LOW (ref 36.0–46.0)
Hemoglobin: 8.3 g/dL — ABNORMAL LOW (ref 12.0–15.0)
MCH: 32 pg (ref 26.0–34.0)
MCHC: 32.2 g/dL (ref 30.0–36.0)
MCV: 99.6 fL (ref 80.0–100.0)
Platelets: 204 K/uL (ref 150–400)
RBC: 2.59 MIL/uL — ABNORMAL LOW (ref 3.87–5.11)
RDW: 19.1 % — ABNORMAL HIGH (ref 11.5–15.5)
WBC: 8.3 K/uL (ref 4.0–10.5)
nRBC: 0 % (ref 0.0–0.2)

## 2024-04-28 MED ORDER — INSULIN ASPART 100 UNIT/ML IJ SOLN
0.0000 [IU] | Freq: Three times a day (TID) | INTRAMUSCULAR | Status: DC
  Administered 2024-04-28: 2 [IU] via SUBCUTANEOUS

## 2024-04-28 MED ORDER — INSULIN GLARGINE-YFGN 100 UNIT/ML ~~LOC~~ SOLN
5.0000 [IU] | Freq: Every day | SUBCUTANEOUS | Status: DC
  Administered 2024-04-28 – 2024-04-29 (×2): 5 [IU] via SUBCUTANEOUS
  Filled 2024-04-28 (×2): qty 0.05

## 2024-04-28 NOTE — Progress Notes (Signed)
 Nurse messaged about the patient slipping out of her bed when attempting to reach for a napkin on the floor to clean up after herself. Went to see patient and she states she slid against the bed's side rail. She states she fell onto her butt into a seated position. She denies hitting her head or any back/leg pain. On exam, no mild spinal tenderness or lesions. After assessment, patient does not need stat XR for back due to her being asymptomatic and the nature of the fall.   Jaber Dunlow, DO Internal Medicine Resident, PGY-1 Please contact the on call pager at [phone] for any urgent or emergent needs. 10:17 PM 04/28/2024

## 2024-04-28 NOTE — Plan of Care (Signed)

## 2024-04-28 NOTE — Plan of Care (Signed)
  Problem: Education: Goal: Knowledge of General Education information will improve Description: Including pain rating scale, medication(s)/side effects and non-pharmacologic comfort measures Outcome: Progressing   Problem: Health Behavior/Discharge Planning: Goal: Ability to manage health-related needs will improve Outcome: Progressing   Problem: Clinical Measurements: Goal: Ability to maintain clinical measurements within normal limits will improve Outcome: Progressing Goal: Will remain free from infection Outcome: Progressing Goal: Diagnostic test results will improve Outcome: Progressing Goal: Respiratory complications will improve Outcome: Progressing Goal: Cardiovascular complication will be avoided Outcome: Progressing   Problem: Activity: Goal: Risk for activity intolerance will decrease Outcome: Progressing   Problem: Nutrition: Goal: Adequate nutrition will be maintained Outcome: Progressing   Problem: Elimination: Goal: Will not experience complications related to bowel motility Outcome: Progressing Goal: Will not experience complications related to urinary retention Outcome: Progressing   Problem: Pain Managment: Goal: General experience of comfort will improve and/or be controlled Outcome: Progressing

## 2024-04-28 NOTE — Progress Notes (Addendum)
 Pt found sitting in the floor in her room by a nurse and a tech. Pt stated she was sitting on the edge of her bed and decided to reach for a napkin with her foot and slid out of the bed and onto her butt. Fall was unwitnessed. Pt has no complaints of pain or discomfort. Jaden Amilibia, DO notified. 5 attempts made to contact Norleen (son) and [name] (son). No answer to any of the numbers called. 2 of which went straight to voicemail. Pt hoping this does not interfere with discharge. Pt previously refusing alarms. Pt re-educated and alarms have been set. Pt is A&Ox4, no deficits noted.   Prior to this, pt was refusing bed alarm and was educated on that decision and told to still call when she needed help.

## 2024-04-28 NOTE — Progress Notes (Signed)
 Mobility Specialist: Progress Note   04/28/24 1248  Mobility  Activity Ambulated with assistance in hallway  Level of Assistance Contact guard assist, steadying assist  Assistive Device Front wheel walker  Distance Ambulated (ft) 90 ft  Activity Response Tolerated well  Mobility Referral Yes  Mobility visit 1 Mobility  Mobility Specialist Start Time (ACUTE ONLY) 1236  Mobility Specialist Stop Time (ACUTE ONLY) 1245  Mobility Specialist Time Calculation (min) (ACUTE ONLY) 9 min    Pt received in bed, agreeable to mobility session. CGA for ambulation. C/o fatigue from just waking up, weakness, and lightheadedness. Rated lightheadedness 5/10 and stated it was tolerable. Denies any dizziness. Ambulated down the hallway and back without fault. Left in chair with all needs met, call bell in reach. Daughter present.   Ileana Lute Mobility Specialist Please contact via SecureChat or Rehab office at [phone]

## 2024-04-28 NOTE — Progress Notes (Signed)
 Nephrology Follow-Up Consult note   Assessment/Recommendations: Anna Carr is a/an 83 y.o. female with a past medical history significant for ESRD, HTN, CHF, GI bleeding, PAD, COPD, DM 2, admitted for ischemic right foot.       Dialysis Orders:  TTS - [place] 3:30hr, 400/A1.5, EDW 71kg, 2K2/5Ca bath, UFP #2, AVG. no heparin    Assessment/Plan:  Ischemic R foot: cardiology and vascular surgery involved.  Options are fairly limited due to the patient's difficulty tolerating antiplatelet therapy because of GI bleeding.  Follow team's recommendations.  We are trying to get as much fluid off her body as possible with dialysis.  Did well on dialysis on 7/3  ESRD: Continue HD on usual TTS schedule   Hypertension/volume: BP ok, but with edema. ^ UFG as tolerated.  Sometimes limited by low blood pressures  Anemia, recent GIB: Hgb 8-9.  Some dilution.  Iron  replete.  Consider ESA in the outpatient setting  Metabolic bone disease: Ca ok, Phso high - resumed home binders.  T2DM  HFpEF   Recommendations conveyed to primary service.    [name] JINNY Player [facility] 04/28/2024 8:53 AM  ___________________________________________________________  CC: right lower extremity wound  Interval History/Subjective: Patient was able to have a bowel movement.  Tolerated dialysis yesterday with 3.5 L removed.  Hoping to go home today   Medications:  Current Facility-Administered Medications  Medication Dose Route Frequency Provider Last Rate Last Admin   acetaminophen  (TYLENOL ) tablet 1,000 mg  1,000 mg Oral Q8H PRN Jolaine Pac, DO   1,000 mg at 04/25/24 1140   artificial tears ophthalmic solution 1 drop  1 drop Both Eyes TID PRN Jolaine Pac, DO       aspirin  EC tablet 81 mg  81 mg Oral Daily Jolaine Pac, DO   81 mg at 04/26/24 1014   cephALEXin  (KEFLEX ) capsule 500 mg  500 mg Oral Q12H D'Mello, Rosalyn, DO   500 mg at 04/27/24 2123   Chlorhexidine  Gluconate Cloth 2 %  PADS 6 each  6 each Topical Q0600 Stovall, Kathryn R, PA-C   6 each at 04/28/24 9381   clopidogrel  (PLAVIX ) tablet 75 mg  75 mg Oral Daily D'Mello, Rosalyn, DO   75 mg at 04/26/24 1013   cyanocobalamin  (VITAMIN B12) tablet 1,000 mcg  1,000 mcg Oral QPM Jolaine Pac, DO   1,000 mcg at 04/27/24 1701   ferric citrate  (AURYXIA ) tablet 210 mg  210 mg Oral TID WC Zheng, Michael, DO   210 mg at 04/27/24 1600   fluticasone  (FLONASE ) 50 MCG/ACT nasal spray 1 spray  1 spray Each Nare Daily PRN Jolaine Pac, DO       furosemide  (LASIX ) tablet 80 mg  80 mg Oral Q1500 Jolaine Pac, DO   80 mg at 04/27/24 1549   heparin  injection 5,000 Units  5,000 Units Subcutaneous Q8H Jolaine Pac, DO   5,000 Units at 04/28/24 9381   hydrocortisone  1 % ointment 1 Application  1 Application Topical BID Jolaine Pac, DO   1 Application at 04/27/24 2123   Muscle Rub CREA 1 Application  1 Application Topical BID PRN Jolaine Pac, DO       pantoprazole  (PROTONIX ) EC tablet 40 mg  40 mg Oral BID Jolaine Pac, DO   40 mg at 04/27/24 2122   polyethylene glycol (MIRALAX  / GLYCOLAX ) packet 17 g  17 g Oral Daily D'Mello, Rosalyn, DO   17 g at 04/27/24 1702   rosuvastatin  (CRESTOR ) tablet 10 mg  10 mg Oral  Daily D'Mello, Rosalyn, DO   10 mg at 04/26/24 1013   senna (SENOKOT) tablet 8.6 mg  1 tablet Oral Daily D'Mello, Rosalyn, DO       Vitamin E  CAPS 400 Units  400 Units Oral q AM Jolaine Pac, DO   400 Units at 04/27/24 1408      Review of Systems: 10 systems reviewed and negative except per interval history/subjective  Physical Exam: Vitals:   04/28/24 0556 04/28/24 0814  BP: (!) 114/45 (!) 103/43  Pulse: 66 76  Resp: 16 17  Temp: 97.9 F (36.6 C) (!) 97.5 F (36.4 C)  SpO2: 99% 97%   No intake/output data recorded.  Intake/Output Summary (Last 24 hours) at 04/28/2024 0853 Last data filed at 04/27/2024 1240 Gross per 24 hour  Intake --  Output 3500 ml  Net -3500 ml   Constitutional:  well-appearing, no acute distress ENMT: ears and nose without scars or lesions, MMM CV: normal rate, bilateral lower extremity edema Respiratory: Bilateral chest rise, normal work of breathing Gastrointestinal: soft, non-tender, no palpable masses or hernias Skin: right lower extremity with wounds and erythema, otherwise no visible lesions or rashes Psych: alert, judgement/insight appropriate, appropriate mood and affect   Test Results I personally reviewed new and old clinical labs and radiology tests Lab Results  Component Value Date   NA 127 (L) 04/28/2024   K 4.0 04/28/2024   CL 91 (L) 04/28/2024   CO2 24 04/28/2024   BUN 34 (H) 04/28/2024   CREATININE 4.26 (H) 04/28/2024   CALCIUM  8.7 (L) 04/28/2024   ALBUMIN  2.8 (L) 04/26/2024   PHOS 6.3 (H) 04/26/2024    CBC Recent Labs  Lab 04/24/24 2145 04/25/24 0900 04/26/24 0820 04/27/24 0250 04/28/24 0222  WBC 6.6   < > 7.3 7.7 8.3  NEUTROABS 5.1  --   --   --   --   HGB 8.7*   < > 9.2* 8.9* 8.3*  HCT 28.4*   < > 28.9* 28.0* 25.8*  MCV 102.9*   < > 101.0* 101.1* 99.6  PLT 179   < > 200 209 204   < > = values in this interval not displayed.

## 2024-04-28 NOTE — Progress Notes (Signed)
 Daughter called back and I spoke with her and explained the situation with her mother.

## 2024-04-28 NOTE — Progress Notes (Signed)
 Mobility Specialist: Progress Note   04/28/24 1528  Mobility  Activity Ambulated with assistance in hallway;Ambulated with assistance to bathroom  Level of Assistance Standby assist, set-up cues, supervision of patient - no hands on  Assistive Device Front wheel walker  Distance Ambulated (ft) 90 ft  Activity Response Tolerated well  Mobility Referral Yes  Mobility visit 1 Mobility  Mobility Specialist Start Time (ACUTE ONLY) 1514  Mobility Specialist Stop Time (ACUTE ONLY) 1525  Mobility Specialist Time Calculation (min) (ACUTE ONLY) 11 min    Pt received in chair, agreeable to mobility session. SBA throughout. Ambulated to the BR and back to the chair, then ambulated down the hallway and back. Pt c/o fatigue stating she's been moving around all day going back and forth to the BR. Distance limited by bil leg weakness and fatigue. Returned to room without fault. Left in chair with all needs met, call bell in reach. Daughter present.   Ileana Lute Mobility Specialist Please contact via SecureChat or Rehab office at [phone]

## 2024-04-28 NOTE — Care Management Important Message (Signed)
 Important Message  Patient Details  Name: Anna Carr MRN: [id number] Date of Birth: 01-Feb-1941   Important Message Given:  Yes - Medicare IM     [name] 04/28/2024, 12:58 PM

## 2024-04-28 NOTE — Progress Notes (Addendum)
 HD#4 SUBJECTIVE:  Patient Summary: Anna Carr is a 83 y.o. 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   Overnight Events: No overnight events  Interim History: Patient was sitting up in her chair this morning. Patient expressed interest in wanting to go home today. We did explain to her and her daughter at bedside that due to her history of duodenal AVMs that are still there, she will likely continue to have intermittent/slow bleeding that may require transfusions. Patient has been tolerating DAPT therapy well in this setting and has not had any big drops in hemoglobin or signs of bleeding. Patient was informed that there is still the risk that patient could bleed on DAPT therapy and have to come back to hospital for transfusion as well.  Patient's daughter was updated on progress at bedside.Patient did not notice blood in stool after bowel movement. Nurse did note that patient had 3 liquid bowel movements. Patient has had some relief from itching of her vulva as well. Patient's questions answered. No other complaints.    OBJECTIVE:  Vital Signs: Vitals:   04/27/24 1240 04/27/24 1323 04/27/24 1551 04/27/24 2004  BP: 105/69 (!) 119/53 (!) 112/28 115/86  Pulse: 78 81 77 75  Resp: 16 19 18 18   Temp: 97.9 F (36.6 C) 97.9 F (36.6 C) 97.8 F (36.6 C) 97.7 F (36.5 C)  TempSrc:    Oral  SpO2: 94% 93% 95% 97%  Weight: 72.1 kg     Height:       Supplemental O2: Room Air SpO2: 97 % O2 Flow Rate (L/min): 3 L/min  Filed Weights   04/27/24 0451 04/27/24 0902 04/27/24 1240  Weight: 76.6 kg 74.1 kg 72.1 kg      Intake/Output Summary (Last 24 hours) at 04/28/2024 0542 Last data filed at 04/27/2024 1240 Gross per 24 hour  Intake --  Output 3500 ml  Net -3500 ml   Net IO Since Admission: -5,740 mL [04/28/24 0542]  Physical Exam: Physical Exam Cardiovascular:     Rate and Rhythm: Normal rate and regular rhythm.     Heart sounds: Normal heart sounds.  Pulmonary:     Effort: Pulmonary effort is normal.     Breath sounds: Rales present.     Comments: Occasional rales at lung base on right side Musculoskeletal:     Right lower leg: Edema present.     Left lower leg: Edema present.     Comments: 1+ edema on bilateral lower extremities 2+ right pedal edema   Skin:    Comments: Skin was cool to the touch on the right foot. Patient's sores on right foot were not oozing. Pedal edema 2+ was present on the right foot. Increased erythema around right foot compared to yesterday.   Neurological:     Mental Status: She is alert.     Patient Lines/Drains/Airways Status     Active Line/Drains/Airways     Name Placement date Placement time Site Days   Peripheral IV 04/25/24 22 G Anterior;Right Antecubital 04/25/24  0358  Antecubital  1   Fistula / Graft Left Upper arm Arteriovenous vein graft 03/12/21  1254  Upper arm  1141   Wound / Incision (Open or  Dehisced) 02/26/24 Other (Comment) Knee Right;Left;Anterior bilateral knee abrasion 02/26/24  1900  Knee  60   Wound 04/10/24 Other (Comment) Leg Right 04/10/24  --  Leg  16   Wound 04/12/24 0534 Other (Comment) Arm Lower;Posterior;Proximal;Right 04/12/24  0534  Arm  14   Wound 04/12/24 0536 Other (Comment) Thigh Posterior;Proximal;Right 04/12/24  0536  Thigh  14   Wound 04/12/24 0539 Other (Comment) Pretibial Distal;Right 04/12/24  0539  Pretibial  14   Wound 04/12/24 0541 Pressure Injury Heel Posterior;Right Stage 2 -  Partial thickness loss of dermis presenting as a shallow open injury with a red, pink wound bed without slough. 04/12/24  0541  Heel   14   Wound 04/12/24 0542 Pressure Injury Heel Left;Posterior Stage 1 -  Intact skin with non-blanchable redness of a localized area usually over a bony prominence. 04/12/24  0542  Heel  14   Wound 04/12/24 0543 Other (Comment) Toe (Comment  which one) Anterior;Right 04/12/24  0543  Toe (Comment  which one)  14   Wound 04/12/24 0544 Other (Comment) Toe (Comment  which one) Anterior;Right 04/12/24  0544  Toe (Comment  which one)  14   Wound 04/12/24 1322 Other (Comment) Arm Anterior;Left;Lower 04/12/24  1322  Arm  14            Pertinent labs and imaging:     Latest Ref Rng & Units 04/28/2024    2:22 AM 04/27/2024    2:50 AM 04/26/2024    8:20 AM  CBC  WBC 4.0 - 10.5 K/uL 8.3  7.7  7.3   Hemoglobin 12.0 - 15.0 g/dL 8.3  8.9  9.2   Hematocrit 36.0 - 46.0 % 25.8  28.0  28.9   Platelets 150 - 400 K/uL 204  209  200        Latest Ref Rng & Units 04/28/2024    2:22 AM 04/27/2024    2:20 PM 04/27/2024    2:50 AM  CMP  Glucose 70 - 99 mg/dL 845  875  833   BUN 8 - 23 mg/dL 34  28  61   Creatinine 0.44 - 1.00 mg/dL 5.73  6.32  3.87   Sodium 135 - 145 mmol/L 127  132  126   Potassium 3.5 - 5.1 mmol/L 4.0  4.1  5.1   Chloride 98 - 111 mmol/L 91  91  87   CO2 22 - 32 mmol/L 24  21  21    Calcium  8.9 - 10.3 mg/dL 8.7  9.0  8.8     No results found.   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  Plan: #Right Lower Extremity Critical Limb  Ischemia #RLE wound #Volume overload - Patient's DP/PT pulses on the right lower extremity were not palpable at [facility] ED. She was transferred here for vascular surgery evaluation for critical limb ischemia. Doppler at admission showed bilateral popliteal pulses and left posterior tibial pulse but no right and left DP or right TP.  -Started on  DAPT therapy per vascular surgery to optimize patient and will f/u in 2-3 weeks outpatient.  -Continue DAPT therapy unless there are signs of bleeding .  -Continue cephalexin  500 mg BID for 7 days for cellulitis  -Volume management per HD given minimal UOP; continuing oral furosemide   #Presyncope - Patient endorses chronic dizziness, lightheadedness, and exertional dyspnea. Could be due to deconditioning from multiple hospital stays. - Patient had signs of vascular congestion and bilateral pleural effusions on CT and CXR.  - Orthostatic vitals were 114/45 sitting, 105/38 standing and 116/63 after standing for 3 minutes on 7/2 - EKG at [facility] showed sinus rhythm with marked sinus arhythmia.  - Continue to work with PT/OT/ mobility specialists.   #Duodenal AVMs w/ recent GI bleed - Patient has history of chronic GI bleeds and blood transfusions  -- Hgb at [facility] ED was 9.3. Hgb is 8.3 today - Monitor H/H and notify physician for signs of melena and hematochezia in the setting of DAPT  - Will repeat H&H this afternoon and reassess -continue Protonix  40 mg BID   #ESRD on HD ( [facility])  - continue phosphorous binders per nephrology  -Will continue dialysis schedule  [facility]Th[facility]  #Macrocytic Anemia -Iron  studies are iron  73, TIBC 283, Ferritin 1,188 - etiology is due to anemia of chronic disease and chronic kidney disease -Watch for bleeding symptoms as patient has history of GI bleeds    #Contact Dermatitis  - Patient reported burning and stinging around the vulva region into the perianal region. Erythema but no lesions noted on exams - most likely contact dermatitis, improving - Sitz baths and hydrocortisone  cream   #HFpEF - CXR and CT showed signs of vascular congestion and bilateral pleural effusions - Volume management primarily through HD - continue lasix  80 mg  -Monitor I&Os   #Type 2 Diabetes Mellitus -Home medications include Soliqua  6 units each morning  - semeglee 5 units daily  -last A1c was 5.8 on 5/21  #Hypertension  - Blood pressure has been soft since admission - Holding carvedilol  6.25 mg Bid ( home med) in the setting of lower blood pressure  #constipation  - Patient had a bowel movement had bowel movement 2 days ago, but was complaining of abdominal pain yesterday. Was given enema and had 3 liquidy bowel movements reported - Scheduled senna, miralax   #hyponatremia - patient's baseline is high 120s and low 130s - has stayed in this range during her hospitalization -likely due to fluid shifts and hypervolemia  Best Practice: Diet: Renal diet IVF: Fluids: none VTE: heparin  injection 5,000 Units Start: 04/24/24 2200 Code: Full  Disposition planning: Therapy Recs: PT following Family Contact: to be notified. DISPO: Anticipated discharge  to [facility] pending clinical improvement of volume status .  Signature:  Ramzi Brathwaite D'Mello [facility]  5:42 AM, 04/28/2024  On Call pager [phone]

## 2024-04-29 ENCOUNTER — Other Ambulatory Visit (HOSPITAL_COMMUNITY): Payer: Self-pay

## 2024-04-29 DIAGNOSIS — K31819 Angiodysplasia of stomach and duodenum without bleeding: Secondary | ICD-10-CM

## 2024-04-29 DIAGNOSIS — I5032 Chronic diastolic (congestive) heart failure: Secondary | ICD-10-CM

## 2024-04-29 LAB — RENAL FUNCTION PANEL
Albumin: 3 g/dL — ABNORMAL LOW (ref 3.5–5.0)
Anion gap: 15 (ref 5–15)
BUN: 42 mg/dL — ABNORMAL HIGH (ref 8–23)
CO2: 24 mmol/L (ref 22–32)
Calcium: 9.2 mg/dL (ref 8.9–10.3)
Chloride: 87 mmol/L — ABNORMAL LOW (ref 98–111)
Creatinine, Ser: 5.38 mg/dL — ABNORMAL HIGH (ref 0.44–1.00)
GFR, Estimated: 7 mL/min — ABNORMAL LOW (ref >60)
Glucose, Bld: 129 mg/dL — ABNORMAL HIGH (ref 70–99)
Phosphorus: 5.4 mg/dL — ABNORMAL HIGH (ref 2.5–4.6)
Potassium: 4 mmol/L (ref 3.5–5.1)
Sodium: 126 mmol/L — ABNORMAL LOW (ref 135–145)

## 2024-04-29 LAB — CBC
HCT: 28.4 % — ABNORMAL LOW (ref 36.0–46.0)
Hemoglobin: 9.3 g/dL — ABNORMAL LOW (ref 12.0–15.0)
MCH: 33.2 pg (ref 26.0–34.0)
MCHC: 32.7 g/dL (ref 30.0–36.0)
MCV: 101.4 fL — ABNORMAL HIGH (ref 80.0–100.0)
Platelets: 226 K/uL (ref 150–400)
RBC: 2.8 MIL/uL — ABNORMAL LOW (ref 3.87–5.11)
RDW: 19 % — ABNORMAL HIGH (ref 11.5–15.5)
WBC: 7.2 K/uL (ref 4.0–10.5)
nRBC: 0 % (ref 0.0–0.2)

## 2024-04-29 LAB — GLUCOSE, CAPILLARY: Glucose-Capillary: 131 mg/dL — ABNORMAL HIGH (ref 70–99)

## 2024-04-29 MED ORDER — CAMPHOR-MENTHOL 0.5-0.5 % EX LOTN
1.0000 | TOPICAL_LOTION | CUTANEOUS | 0 refills | Status: DC | PRN
  Filled 2024-04-29: qty 222, fill #0

## 2024-04-29 MED ORDER — CEPHALEXIN 500 MG PO CAPS
500.0000 mg | ORAL_CAPSULE | Freq: Two times a day (BID) | ORAL | 0 refills | Status: AC
  Filled 2024-04-29: qty 6, 3d supply, fill #0

## 2024-04-29 MED ORDER — HYDROCORTISONE 1 % EX OINT
1.0000 | TOPICAL_OINTMENT | Freq: Two times a day (BID) | CUTANEOUS | 0 refills | Status: DC
  Filled 2024-04-29: qty 21, 7d supply, fill #0

## 2024-04-29 MED ORDER — ROSUVASTATIN CALCIUM 10 MG PO TABS
10.0000 mg | ORAL_TABLET | Freq: Every day | ORAL | 0 refills | Status: DC
  Filled 2024-04-29: qty 30, 30d supply, fill #0

## 2024-04-29 MED ORDER — CLOPIDOGREL BISULFATE 75 MG PO TABS
75.0000 mg | ORAL_TABLET | Freq: Every day | ORAL | 0 refills | Status: DC
  Filled 2024-04-29: qty 30, 30d supply, fill #0

## 2024-04-29 NOTE — Discharge Planning (Signed)
 [facility] Patient Discharge Orders- [facility] CLINIC: [place]  Patient's name: Anna Carr Admit/DC Dates: 04/24/2024 - 04/29/2024  Discharge Diagnoses: Critical limb ischemia of right lower extremity      Aranesp : Given: No   Date and amount of last dose: NA   PRBC's Given: No Date/# of units: NA Last Hgb: 9.3 ESA dose for discharge: mircera 200 mcg IV q 2 weeks  IV Iron  dose at discharge: Continue weekly venofer  per protocol  Heparin  change: no  EDW Change: yes New EDW: 70 kg  Bath Change: No  Access intervention/Change: No Details:  Hectorol/Calcitriol  change: No  Discharge Labs: Calcium  9.2 Phosphorus 5.4 Albumin  3.0 K+ 4.0  IV Antibiotics: No Details:  On Coumadin?: No Last INR: Next INR: Managed By:   OTHER/APPTS/LAB ORDERS:    D/C Meds to be reconciled by nurse after every discharge.  Completed By: Ricka Daring [facility] [phone]    Reviewed by: MD:______ RN_______

## 2024-04-29 NOTE — Progress Notes (Signed)
 Nephrology Follow-Up Consult note   Assessment/Recommendations: Anna Carr is a/an 83 y.o. female with a past medical history significant for ESRD, HTN, CHF, GI bleeding, PAD, COPD, DM 2, admitted for ischemic right foot.       Dialysis Orders:  TTS - [place] 3:30hr, 400/A1.5, EDW 71kg, 2K2/5Ca bath, UFP #2, AVG. no heparin    Assessment/Plan:  Ischemic R foot: cardiology and vascular surgery involved.  Options are fairly limited due to the patient's difficulty tolerating antiplatelet therapy because of GI bleeding.  Follow team's recommendations.  We are trying to get as much fluid off her body as possible with dialysis.  ESRD: Continue HD on usual TTS schedule   Hypertension/volume: BP ok, but with edema. ^ UFG as tolerated.  Sometimes limited by low blood pressures  Anemia, recent GIB: Hgb 8-9.  Some dilution.  Iron  replete.  Consider ESA in the outpatient setting  Metabolic bone disease: Calcium  okay.  Continue binders  T2DM  HFpEF Hyponatremia: Sodium slightly low.  Some volume overload.  Recommend fluid restriction of about 1.5 L and aggressive ultrafiltration on dialysis as tolerated   Recommendations conveyed to primary service.    [name] JINNY Player [facility] 04/29/2024 8:24 AM  ___________________________________________________________  CC: right lower extremity wound  Interval History/Subjective: Patient states she slipped out of bed overnight but feels okay today.  Hoping to go home   Medications:  Current Facility-Administered Medications  Medication Dose Route Frequency Provider Last Rate Last Admin   acetaminophen  (TYLENOL ) tablet 1,000 mg  1,000 mg Oral Q8H PRN Jolaine Pac, DO   1,000 mg at 04/25/24 1140   artificial tears ophthalmic solution 1 drop  1 drop Both Eyes TID PRN Jolaine Pac, DO       aspirin  EC tablet 81 mg  81 mg Oral Daily Jolaine Pac, DO   81 mg at 04/28/24 1056   cephALEXin  (KEFLEX ) capsule 500 mg  500 mg  Oral Q12H D'Mello, Rosalyn, DO   500 mg at 04/28/24 2121   Chlorhexidine  Gluconate Cloth 2 % PADS 6 each  6 each Topical Q0600 Stovall, Kathryn R, PA-C   6 each at 04/28/24 9381   clopidogrel  (PLAVIX ) tablet 75 mg  75 mg Oral Daily D'Mello, Rosalyn, DO   75 mg at 04/28/24 1056   cyanocobalamin  (VITAMIN B12) tablet 1,000 mcg  1,000 mcg Oral QPM Jolaine Pac, DO   1,000 mcg at 04/28/24 1854   ferric citrate  (AURYXIA ) tablet 210 mg  210 mg Oral TID WC Zheng, Michael, DO   210 mg at 04/28/24 1649   fluticasone  (FLONASE ) 50 MCG/ACT nasal spray 1 spray  1 spray Each Nare Daily PRN Jolaine Pac, DO       furosemide  (LASIX ) tablet 80 mg  80 mg Oral Q1500 Jolaine Pac, DO   80 mg at 04/28/24 1649   heparin  injection 5,000 Units  5,000 Units Subcutaneous Q8H Jolaine Pac, DO   5,000 Units at 04/28/24 2121   hydrocortisone  1 % ointment 1 Application  1 Application Topical BID Jolaine Pac, DO   1 Application at 04/28/24 2111   insulin  aspart (novoLOG ) injection 0-6 Units  0-6 Units Subcutaneous TID WC Zheng, Michael, DO   2 Units at 04/28/24 1650   insulin  glargine-yfgn (SEMGLEE ) injection 5 Units  5 Units Subcutaneous Daily D'Mello, Rosalyn, DO   5 Units at 04/28/24 1234   Muscle Rub CREA 1 Application  1 Application Topical BID PRN Jolaine Pac, DO       pantoprazole  (PROTONIX )  EC tablet 40 mg  40 mg Oral BID Jolaine Pac, DO   40 mg at 04/28/24 2121   rosuvastatin  (CRESTOR ) tablet 10 mg  10 mg Oral Daily D'Mello, Rosalyn, DO   10 mg at 04/28/24 0937   senna (SENOKOT) tablet 8.6 mg  1 tablet Oral Daily D'Mello, Rosalyn, DO   8.6 mg at 04/28/24 9062   Vitamin E  CAPS 400 Units  400 Units Oral q AM Jolaine Pac, DO   400 Units at 04/28/24 9062      Review of Systems: 10 systems reviewed and negative except per interval history/subjective  Physical Exam: Vitals:   04/29/24 0758 04/29/24 0759  BP: (!) 126/48 (!) 113/47  Pulse: 73 72  Resp: 19 15  Temp:    SpO2: 99% 100%   No  intake/output data recorded. No intake or output data in the 24 hours ending 04/29/24 0824  Constitutional: well-appearing, no acute distress ENMT: ears and nose without scars or lesions, MMM CV: normal rate, bilateral lower extremity edema Respiratory: Bilateral chest rise, normal work of breathing Gastrointestinal: soft, non-tender, no palpable masses or hernias Skin: right lower extremity with wounds and erythema, otherwise no visible lesions or rashes Psych: alert, judgement/insight appropriate, appropriate mood and affect   Test Results I personally reviewed new and old clinical labs and radiology tests Lab Results  Component Value Date   NA 126 (L) 04/29/2024   K 4.0 04/29/2024   CL 87 (L) 04/29/2024   CO2 24 04/29/2024   BUN 42 (H) 04/29/2024   CREATININE 5.38 (H) 04/29/2024   CALCIUM  9.2 04/29/2024   ALBUMIN  3.0 (L) 04/29/2024   PHOS 5.4 (H) 04/29/2024    CBC Recent Labs  Lab 04/24/24 2145 04/25/24 0900 04/27/24 0250 04/28/24 0222 04/28/24 1420 04/29/24 0525  WBC 6.6   < > 7.7 8.3  --  7.2  NEUTROABS 5.1  --   --   --   --   --   HGB 8.7*   < > 8.9* 8.3* 9.1* 9.3*  HCT 28.4*   < > 28.0* 25.8* 27.8* 28.4*  MCV 102.9*   < > 101.1* 99.6  --  101.4*  PLT 179   < > 209 204  --  226   < > = values in this interval not displayed.

## 2024-04-29 NOTE — Discharge Instructions (Addendum)
 You were hospitalized for  poor blood flow into your right leg and you were seen by the vascular surgeons. You were started on aspirin  and plavix , which you tolerated well and your hemoglobin stayed stable. We are still concerned that you are at an increased risk for bleeding and needing a transfusion. This is why it is important that you follow up with your primary care provider.    If you notice any new signs of bleeding, please stop your aspirin  and plavix  and get in touch with your primary care physician.   Thank you for allowing us  to be part of your care.   We arranged for you to follow up at:   Dr.Robins - Vascular Surgery  Dr.Grisso or any other associates- PCP   Please note these changes made to your medications:   *Please START taking:  Aspirin  81 mg daily Plavix  75 mg daily Rosuvastatin  10 mg daily  Sarna lotion for itching once daily as needed Cephalexin  500 mg two times daily for 3 days. Please start taking tonight 7/5  *Please STOP taking:  STOP Meloxicam 7.5 mg daily  (Please do no take advil, ibuprofen, meloxicam or any other  medicines in that class that could cause bleeding) STOP Carvedilol  6.25 mg until you see your primary care provider

## 2024-04-29 NOTE — Progress Notes (Signed)
 Received patient in bed to unit.  Alert and oriented.  Informed consent signed and in chart.   TX duration:3.5 hours  Patient tolerated well.  Transported back to the room  Alert, without acute distress.  Hand-off given to patient's nurse.   Access used: Left AV Graft Upper arm Access issues: none  Total UF removed: 3.5L Medication(s) given: none   04/29/24 1140  Vitals  Temp 97.7 F (36.5 C)  Temp Source Oral  BP (!) 121/99  Pulse Rate 85  Resp 19  MEWS COLOR  MEWS Score Color Green  Oxygen Therapy  SpO2 94 %  O2 Device Room Air  Patient Activity (if Appropriate) In bed  Pulse Oximetry Type Continuous  Oximetry Probe Site Changed No  MEWS Score  MEWS Temp 0  MEWS Systolic 0  MEWS Pulse 0  MEWS RR 0  MEWS LOC 0  MEWS Score 0     Camellia Brasil LPN [facility]

## 2024-04-29 NOTE — Progress Notes (Signed)
   04/29/24 1140  Vitals  Temp 97.7 F (36.5 C)  Temp Source Oral  BP (!) 121/99  Pulse Rate 85  Resp 19  Oxygen Therapy  SpO2 94 %  O2 Device Room Air  Patient Activity (if Appropriate) In bed  Pulse Oximetry Type Continuous  Oximetry Probe Site Changed No  During Treatment Monitoring  Blood Flow Rate (mL/min) 400 mL/min  Arterial Pressure (mmHg) -164.43 mmHg  Venous Pressure (mmHg) 192.72 mmHg  TMP (mmHg) 14.14 mmHg  Ultrafiltration Rate (mL/min) 1232 mL/min  Dialysate Flow Rate (mL/min) 299 ml/min  Duration of HD Treatment -hour(s) 3.47 hour(s)  Cumulative Fluid Removed (mL) per Treatment  3463.53  HD Safety Checks Performed Yes  Intra-Hemodialysis Comments Tolerated well;Tx completed  Dialysis Fluid Bolus Normal Saline  Bolus Amount (mL) 300 mL  Post Treatment  Dialyzer Clearance Clear  Liters Processed 84  Fluid Removed (mL) 3500 mL  Tolerated HD Treatment Yes  AVG/AVF Arterial Site Held (minutes) 7 minutes  AVG/AVF Venous Site Held (minutes) 7 minutes  Fistula / Graft Left Upper arm Arteriovenous vein graft  Placement Date/Time: 03/12/21 1254   Orientation: Left  Access Location: Upper arm  Access Type: Arteriovenous vein graft  Status Deaccessed

## 2024-04-29 NOTE — Discharge Summary (Addendum)
 Name: Anna Carr MRN: [id number] DOB: 06-30-1941 83 y.o. PCP: Thurmond Cathlyn LABOR., MD  Date of Admission: 04/24/2024  2:54 PM Date of Discharge: 04/29/2024 Attending Physician: Dr. MICAEL Riis Winfrey  Discharge Diagnosis: 1. Principal Problem:   Critical limb ischemia of right lower extremity ([name]) Active Problems:   CHF with left ventricular diastolic dysfunction, NYHA class 1 ([name])   Type 2 diabetes mellitus with chronic kidney disease on chronic dialysis, with long-term current use of insulin  ([name])   ESRD on hemodialysis ([name])   Hypertension   Anemia due to chronic blood loss   Peripheral vascular disease, unspecified ([name]) critical stenosis lower extremity   Angiodysplasia of small intestine   Discharge Medications: Allergies as of 04/29/2024       Reactions   Atorvastatin  Other (See Comments)   Constipation   Wound Dressing Adhesive Rash        Medication List     PAUSE taking these medications    carvedilol  6.25 MG tablet Wait to take this until your doctor or other care provider tells you to start again. Commonly known as: COREG  Take 6.25 mg by mouth daily.       STOP taking these medications    ibuprofen 200 MG tablet Commonly known as: ADVIL   lidocaine  5 % Commonly known as: LIDODERM    meloxicam 7.5 MG tablet Commonly known as: MOBIC       TAKE these medications    acetaminophen  500 MG tablet Commonly known as: TYLENOL  Take 500 mg by mouth every 6 (six) hours as needed for mild pain (pain score 1-3) or moderate pain (pain score 4-6).   ascorbic acid  1000 MG tablet Commonly known as: VITAMIN C  Take 1,000 mg by mouth at bedtime.   aspirin  EC 81 MG tablet Take 1 tablet (81 mg total) by mouth daily. Swallow whole.   Biotin  5000 MCG Tabs Take 5,000 mcg by mouth daily in the afternoon.   camphor-menthol  lotion Commonly known as: SARNA Apply 1 Application topically as needed for itching.   carboxymethylcellulose 0.5 % Soln Commonly  known as: REFRESH PLUS Place 1 drop into both eyes 3 (three) times daily as needed (dry eyes).   cephALEXin  500 MG capsule Commonly known as: KEFLEX  Take 1 capsule (500 mg total) by mouth 2 (two) times daily for 3 days. Start taking this medication tonight on 7/5   clopidogrel  75 MG tablet Commonly known as: PLAVIX  Take 1 tablet (75 mg total) by mouth daily.   cyanocobalamin  1000 MCG tablet Commonly known as: VITAMIN B12 Take 1,000 mcg by mouth every evening.   ferric citrate  1 GM 210 MG(Fe) tablet Commonly known as: AURYXIA  Take 210 mg by mouth 3 (three) times daily with meals.   FISH OIL PO Take 1 capsule by mouth in the morning and at bedtime.   fluticasone  50 MCG/ACT nasal spray Commonly known as: FLONASE  Place 1 spray into both nostrils daily as needed (Congestion).   furosemide  80 MG tablet Commonly known as: LASIX  Take 80 mg by mouth daily in the afternoon.   hydrocortisone  1 % ointment Apply 1 Application topically 2 (two) times daily for 7 days.   lidocaine -prilocaine  cream Commonly known as: EMLA  Apply 1 application topically as needed (prior to fisula access). Tuesday, Thursday, Saturday for dialysis   mupirocin  ointment 2 % Commonly known as: BACTROBAN  Apply 1 Application topically 2 (two) times daily.   Muscle Rub 10-15 % Crea Apply 1 Application topically 2 (two) times daily. Apply to  left shoulder. What changed:  when to take this reasons to take this   pantoprazole  40 MG tablet Commonly known as: PROTONIX  Take 1 tablet (40 mg total) by mouth 2 (two) times daily.   polyethylene glycol 17 g packet Commonly known as: MIRALAX  / GLYCOLAX  Take 17 g by mouth 2 (two) times daily as needed for moderate constipation.   PROBIOTIC ACIDOPHILUS PO Take 1 capsule by mouth in the morning.   rosuvastatin  10 MG tablet Commonly known as: CRESTOR  Take 1 tablet (10 mg total) by mouth daily.   senna 8.6 MG Tabs tablet Commonly known as: SENOKOT Take 1 tablet  (8.6 mg total) by mouth daily as needed for mild constipation.   Soliqua  100-33 UNT-MCG/ML Sopn Generic drug: Insulin  Glargine-Lixisenatide  Inject 6 Units into the skin in the morning.   Vitamin E  670 MG (1000 UT) Caps Take 1,000 Units by mouth in the morning.               Discharge Care Instructions  (From admission, onward)           Start     Ordered   04/29/24 0000  Discharge wound care:       Comments: Cleanse scattered wounds to R lower leg, R foot/toes and R heel with Vashe wound cleanser Anna Carr [phone]) do not rinse and allow to air dry.  Apply Hydrocortisone  cream as ordered by primary MD.  Cover any open areas and R heel with Xeroform gauze Anna Carr [phone]) and secure with silicone foam or Kerlix roll gauze whichever patient prefers   04/29/24 1041            Disposition and follow-up:   [name] was discharged from [facility] in [place] condition.  At the hospital follow up visit please address:  1.  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 During this hospital visit hemoglobin remained stable and patient did not require transfusion. -DAPT therapy was started by vascular surgery for right critical limb ischemia. Patient is not a candidate for intervention at this time, so medical management through DAPT was started. Patient and daughter informed of risks of bleeding while on this therapy and know if there are signs to stop DAPT therapy.  Presyncope - Patient's orthostatics were not positive for orthostatic hypotension  - Patient does endorse exertional dyspnea and is usually in a hypervolemic state. Deconditioning from multiple hospitalizations also plays apart. Mobility specialists and physical therapy helped to optimize patient during hospital stay  Hypertension - Patient's blood pressure has the tendency to run on the hypotensive side  while in the hospital. To be able to finish dialysis sessions, patient's carvedilol  6.25 mg BID was held. Patient was able to tolerate her dialysis sessions well with this change. Assess for the need to restart in outpatient setting  Cellulitis of Right Foot - Patient has been sent home on cephalexin  500 mg BID for three days.   2.  Labs / imaging needed at time of follow-up: CBC and BMP   3.  Pending labs/ test needing follow-up: none  Follow-up Appointments:  Follow-up Information     [facility] Follow up.   Specialty: Home Health Services Why: [facility] will continue to provide home health services for RN, PT. Contact information: [address] [place] [address] [phone]         Thurmond Cathlyn LABOR., MD. Schedule  an appointment as soon as possible for a visit in 1 week(s).   Specialty: Internal Medicine Contact information: [address] [place] [address] [phone]         Lanis Fonda BRAVO, MD. Schedule an appointment as soon as possible for a visit in 2 week(s).   Specialty: Vascular Surgery Contact information: [address] [place] [address] [phone]                  Hospital Course by problem list:  AISHA GREENBERGER is a 83 y.o. 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  # Critical limb ischemia of right lower extremity #RLE wound  - Patient presented to [facility] with concerns of generalized weakness after a near syncopal fall. She was concerned about her hemoglobin. Patient had also been started on keflex ( on 6/27) by PCP  for concern of right lower extremity cellulitis. She was noted to changes to her skin and swelling at that visit on right lower extremity. At [facility] ED, patient was found to have absent pulses in right DP and PT ( confirmed by doppler at [facility]) and transferred here for vascular evaluation. Vein mapping was performed as part of evaluation. Patient was evaluated by vascular surgery and found to be at increased risk of complications if proceeding with intervention. Medical management with DAPT therapy and other risk modification was opted for. Cephalexin  was also restarted to treat cellulitis component of foot. Patient will follow up with wound care and vascular surgery outpatient for management.   #Presyncope  - Patient has chronic dizziness, lightheadedness, and exertional dyspnea. Hypervolemia and deconditioning from sequential hospital stays contribute to this. Patient worked with physical and occupational therapy, as well as mobility specialists during hospital stay to treat this. Orthostatic vials were also negative.   #ESRD on Hemodialysis( [facility]THS)  - Patient was able to continue her weekly dialysis schedule. Usually two- three liters were taken each session. Patient was also given phosphorous binders which are her home medication.   #Macrocytic Anemia  #History of duodenal AVMs w/ recent GI bleed  - Hemoglobin ranged from 8.3 to 9.3 during this hospitalization. DAPT therapy was started by vascular surgery for right critical limb ischemia, as patient was not candidate for intervention. Patient and daughter informed of risks of bleeding while on this therapy and know if there are any signs of bleeding patient should stop DAPT therapy. Patient and daughter understand the risks of this therapy.Patient tolerated this therapy well while in the hospital and did not require any transfusions.   #Hypertension  - Patient takes carvedilol  6.25 mg BID at home. This medication was held as patient's blood  pressures have been on the softer side during hospitalization. These put patient at risk for not being able to withstand full dialysis session. So patient's home medication as well.   #Contact Dermatitis  - Patient reported burning and stinging around the vulva region into the perianal region. Erythema but no lesions noted on exams. Most likely this was contact dermatitis and was improving with hydrocortisone  cream and sitz baths.  #hyponatremia  - Patient's sodium usually ranges from high 120s -low 130s. She stayed within this range during this hospitalization and  did not have any mental status changes. Likely secondary to fluid shifts as patient is usually hypervolemic with history of ESRD and HFpEF  Stable chronic medical conditions: Type 2 Diabetes Mellitus - Continue home medication soliqua  6 units  HFpEF - Continue lasix  80 mg  Discharge Subjective: Patient was seen this morning in dialysis. She was doing well and ready to go home. Patient did have a fall last night after slipping off her bed onto her buttocks trying to grab a napkin that was on the floor. Patient does not report any head strike or loss of consciousness during this event. Patient reported that she was not in any pain. No other complaints at this time.    Discharge Exam:   BP 107/62 (BP Location: Right Arm)   Pulse 80   Temp 97.8 F (36.6 C) (Oral)   Resp 18   Ht 5' 1 (1.549 m)   Wt 69.4 kg   SpO2 100%   BMI 28.91 kg/m  Discharge exam: Physical Exam Cardiovascular:     Rate and Rhythm: Normal rate and regular rhythm.     Heart sounds: Normal heart sounds.  Pulmonary:     Effort: Pulmonary effort is normal.     Breath sounds: Normal breath sounds.  Musculoskeletal:     Right lower leg: Edema present.     Left lower leg: Edema present.     Comments: 2+ edema in right foot. 1+ edema in right and left lower extremities and left foot. Patient has erythema on right foot with bandage on heel wound. No discharge  noted. Right foot is cool to the touch and DP and PT pulses are not palpable   Neurological:     Mental Status: She is alert.  Psychiatric:        Mood and Affect: Mood normal.      Pertinent Labs, Studies, and Procedures:     Latest Ref Rng & Units 04/29/2024    5:25 AM 04/28/2024    2:20 PM 04/28/2024    2:22 AM  CBC  WBC 4.0 - 10.5 K/uL 7.2   8.3   Hemoglobin 12.0 - 15.0 g/dL 9.3  9.1  8.3   Hematocrit 36.0 - 46.0 % 28.4  27.8  25.8   Platelets 150 - 400 K/uL 226   204        Latest Ref Rng & Units 04/29/2024    5:25 AM 04/28/2024    2:22 AM 04/27/2024    2:20 PM  CMP  Glucose 70 - 99 mg/dL 870  845  875   BUN 8 - 23 mg/dL 42  34  28   Creatinine 0.44 - 1.00 mg/dL 4.61  5.73  6.32   Sodium 135 - 145 mmol/L 126  127  132   Potassium 3.5 - 5.1 mmol/L 4.0  4.0  4.1   Chloride 98 - 111 mmol/L 87  91  91   CO2 22 - 32 mmol/L 24  24  21    Calcium  8.9 - 10.3 mg/dL 9.2  8.7  9.0     VAS US  LOWER EXTREMITY SAPHENOUS VEIN MAPPING Result Date: 04/25/2024 LOWER EXTREMITY VEIN MAPPING Patient Name:  ALAZAY LEICHT  Date of Exam:   04/25/2024 Medical Rec #: [id number]          Accession #:    [id number] Date of Birth: [birth year]          Patient Gender: F Patient Age:   73 years Exam Location:  [facility] Procedure:      VAS US  LOWER EXTREMITY SAPHENOUS VEIN MAPPING Referring Phys: JOSHUA ROBINS --------------------------------------------------------------------------------  Indications:  Ischemia Risk Factors: Hypertension, Diabetes.  Comparison Study: No prior studies. Performing Technologist: Cordella Collet RVT  Examination Guidelines: A complete evaluation includes B-mode imaging, spectral Doppler, color Doppler, and power Doppler as needed of all accessible portions of each vessel. Bilateral testing is considered an integral part of a complete examination. Limited examinations for reoccurring indications may be performed as noted.  +---------------+-----------+----------------------+---------------+-----------+   RT Diameter  RT Findings         GSV            LT Diameter  LT Findings      (cm)                                            (cm)                  +---------------+-----------+----------------------+---------------+-----------+      0.72                     Saphenofemoral         0.78                                                   Junction                                  +---------------+-----------+----------------------+---------------+-----------+      0.30                     Proximal thigh         0.26       branching  +---------------+-----------+----------------------+---------------+-----------+      0.18       branching       Mid thigh            0.31                  +---------------+-----------+----------------------+---------------+-----------+      0.10                      Distal thigh          0.12                  +---------------+-----------+----------------------+---------------+-----------+      0.14                          Knee              0.06                  +---------------+-----------+----------------------+---------------+-----------+      0.10       branching       Prox calf            0.10                  +---------------+-----------+----------------------+---------------+-----------+      0.07       branching        Mid calf  0.09       branching  +---------------+-----------+----------------------+---------------+-----------+      0.08                      Distal calf           0.06                  +---------------+-----------+----------------------+---------------+-----------+ Diagnosing physician: Debby Robertson Electronically signed by Debby Robertson on 04/25/2024 at 9:09:27 PM.    Final      Discharge Instructions: Discharge Instructions     Diet - low sodium heart healthy   Complete by: As directed    Discharge  wound care:   Complete by: As directed    Cleanse scattered wounds to R lower leg, R foot/toes and R heel with Vashe wound cleanser Anna Carr [phone]) do not rinse and allow to air dry.  Apply Hydrocortisone  cream as ordered by primary MD.  Cover any open areas and R heel with Xeroform gauze Anna Carr [phone]) and secure with silicone foam or Kerlix roll gauze whichever patient prefers   Increase activity slowly   Complete by: As directed        Signed: D'Mello, Virna Livengood, DO 04/29/2024, 1:47 PM

## 2024-05-01 ENCOUNTER — Telehealth: Payer: Self-pay | Admitting: Nephrology

## 2024-05-01 NOTE — Progress Notes (Signed)
 Late Note Entry- May 01, 2024  Contacted [facility] this morning to be advised of pt's d/c date and that pt should resume care tomorrow.   Randine Mungo Renal Navigator [phone]

## 2024-05-01 NOTE — Telephone Encounter (Signed)
 Transition of Care - Initial Contact after Hospitalization  Date of discharge:  04/29/24 Date of contact: 05/01/24  Method: Phone Spoke to: Patient's daughter   Patient contacted to discuss transition of care from recent inpatient hospitalization. Patient was admitted to [facility] from 6/30-04/29/24 with discharge diagnosis of critical limb ischemia.   The discharge medication list was reviewed. To stop NSAIDs   Patient will return to his/her outpatient HD unit on: Tuesday   No other concerns at this time.

## 2024-05-03 ENCOUNTER — Telehealth: Payer: Self-pay

## 2024-05-03 NOTE — Telephone Encounter (Signed)
 Pt's daughter called to report pt is stopping her Plavix  and Aspirin  due to blood in her stool.  Dr. Lanis notified.

## 2024-05-04 ENCOUNTER — Encounter (HOSPITAL_COMMUNITY): Payer: Self-pay

## 2024-05-04 ENCOUNTER — Observation Stay (HOSPITAL_COMMUNITY)
Admission: EM | Admit: 2024-05-04 | Discharge: 2024-05-09 | Disposition: A | Discharge status: Home Health | Attending: Internal Medicine | Admitting: Internal Medicine

## 2024-05-04 ENCOUNTER — Other Ambulatory Visit: Payer: Self-pay

## 2024-05-04 DIAGNOSIS — R1310 Dysphagia, unspecified: Secondary | ICD-10-CM | POA: Insufficient documentation

## 2024-05-04 DIAGNOSIS — E1122 Type 2 diabetes mellitus with diabetic chronic kidney disease: Secondary | ICD-10-CM | POA: Diagnosis not present

## 2024-05-04 DIAGNOSIS — L97918 Non-pressure chronic ulcer of unspecified part of right lower leg with other specified severity: Secondary | ICD-10-CM | POA: Insufficient documentation

## 2024-05-04 DIAGNOSIS — I739 Peripheral vascular disease, unspecified: Secondary | ICD-10-CM | POA: Diagnosis present

## 2024-05-04 DIAGNOSIS — Z8616 Personal history of COVID-19: Secondary | ICD-10-CM | POA: Diagnosis not present

## 2024-05-04 DIAGNOSIS — I70233 Atherosclerosis of native arteries of right leg with ulceration of ankle: Secondary | ICD-10-CM | POA: Insufficient documentation

## 2024-05-04 DIAGNOSIS — Z992 Dependence on renal dialysis: Secondary | ICD-10-CM | POA: Insufficient documentation

## 2024-05-04 DIAGNOSIS — I9581 Postprocedural hypotension: Secondary | ICD-10-CM | POA: Insufficient documentation

## 2024-05-04 DIAGNOSIS — Z87891 Personal history of nicotine dependence: Secondary | ICD-10-CM | POA: Diagnosis not present

## 2024-05-04 DIAGNOSIS — I480 Paroxysmal atrial fibrillation: Secondary | ICD-10-CM | POA: Diagnosis not present

## 2024-05-04 DIAGNOSIS — I132 Hypertensive heart and chronic kidney disease with heart failure and with stage 5 chronic kidney disease, or end stage renal disease: Secondary | ICD-10-CM | POA: Insufficient documentation

## 2024-05-04 DIAGNOSIS — K625 Hemorrhage of anus and rectum: Secondary | ICD-10-CM | POA: Insufficient documentation

## 2024-05-04 DIAGNOSIS — Z79899 Other long term (current) drug therapy: Secondary | ICD-10-CM | POA: Diagnosis not present

## 2024-05-04 DIAGNOSIS — I5031 Acute diastolic (congestive) heart failure: Secondary | ICD-10-CM | POA: Diagnosis not present

## 2024-05-04 DIAGNOSIS — D649 Anemia, unspecified: Principal | ICD-10-CM

## 2024-05-04 DIAGNOSIS — D631 Anemia in chronic kidney disease: Secondary | ICD-10-CM | POA: Diagnosis not present

## 2024-05-04 DIAGNOSIS — K5521 Angiodysplasia of colon with hemorrhage: Secondary | ICD-10-CM | POA: Insufficient documentation

## 2024-05-04 DIAGNOSIS — N186 End stage renal disease: Secondary | ICD-10-CM | POA: Insufficient documentation

## 2024-05-04 DIAGNOSIS — D62 Acute posthemorrhagic anemia: Principal | ICD-10-CM | POA: Insufficient documentation

## 2024-05-04 DIAGNOSIS — R531 Weakness: Secondary | ICD-10-CM | POA: Diagnosis present

## 2024-05-04 DIAGNOSIS — I953 Hypotension of hemodialysis: Secondary | ICD-10-CM | POA: Insufficient documentation

## 2024-05-04 DIAGNOSIS — J449 Chronic obstructive pulmonary disease, unspecified: Secondary | ICD-10-CM | POA: Insufficient documentation

## 2024-05-04 DIAGNOSIS — I340 Nonrheumatic mitral (valve) insufficiency: Secondary | ICD-10-CM | POA: Diagnosis present

## 2024-05-04 DIAGNOSIS — I10 Essential (primary) hypertension: Secondary | ICD-10-CM | POA: Diagnosis present

## 2024-05-04 DIAGNOSIS — K5520 Angiodysplasia of colon without hemorrhage: Secondary | ICD-10-CM | POA: Diagnosis present

## 2024-05-04 DIAGNOSIS — Z794 Long term (current) use of insulin: Secondary | ICD-10-CM | POA: Diagnosis not present

## 2024-05-04 DIAGNOSIS — I5030 Unspecified diastolic (congestive) heart failure: Secondary | ICD-10-CM | POA: Diagnosis present

## 2024-05-04 LAB — PREPARE RBC (CROSSMATCH)

## 2024-05-04 LAB — COMPREHENSIVE METABOLIC PANEL WITH GFR
ALT: 31 U/L (ref 0–44)
AST: 48 U/L — ABNORMAL HIGH (ref 15–41)
Albumin: 3 g/dL — ABNORMAL LOW (ref 3.5–5.0)
Alkaline Phosphatase: 108 U/L (ref 38–126)
Anion gap: 15 (ref 5–15)
BUN: 27 mg/dL — ABNORMAL HIGH (ref 8–23)
CO2: 27 mmol/L (ref 22–32)
Calcium: 9 mg/dL (ref 8.9–10.3)
Chloride: 93 mmol/L — ABNORMAL LOW (ref 98–111)
Creatinine, Ser: 3.14 mg/dL — ABNORMAL HIGH (ref 0.44–1.00)
GFR, Estimated: 14 mL/min — ABNORMAL LOW (ref >60)
Glucose, Bld: 163 mg/dL — ABNORMAL HIGH (ref 70–99)
Potassium: 3.9 mmol/L (ref 3.5–5.1)
Sodium: 135 mmol/L (ref 135–145)
Total Bilirubin: 1.2 mg/dL (ref 0.0–1.2)
Total Protein: 6 g/dL — ABNORMAL LOW (ref 6.5–8.1)

## 2024-05-04 LAB — CBC
HCT: 23 % — ABNORMAL LOW (ref 36.0–46.0)
Hemoglobin: 7.2 g/dL — ABNORMAL LOW (ref 12.0–15.0)
MCH: 32.1 pg (ref 26.0–34.0)
MCHC: 31.3 g/dL (ref 30.0–36.0)
MCV: 102.7 fL — ABNORMAL HIGH (ref 80.0–100.0)
Platelets: 202 K/uL (ref 150–400)
RBC: 2.24 MIL/uL — ABNORMAL LOW (ref 3.87–5.11)
RDW: 18.3 % — ABNORMAL HIGH (ref 11.5–15.5)
WBC: 4.9 K/uL (ref 4.0–10.5)
nRBC: 0 % (ref 0.0–0.2)

## 2024-05-04 LAB — POC OCCULT BLOOD, ED: Fecal Occult Bld: POSITIVE — AB

## 2024-05-04 MED ORDER — VITAMIN B-12 1000 MCG PO TABS
1000.0000 ug | ORAL_TABLET | Freq: Every evening | ORAL | Status: DC
  Administered 2024-05-05 – 2024-05-08 (×4): 1000 ug via ORAL
  Filled 2024-05-04 (×4): qty 1

## 2024-05-04 MED ORDER — SENNA 8.6 MG PO TABS
1.0000 | ORAL_TABLET | Freq: Every day | ORAL | Status: DC | PRN
  Administered 2024-05-05 – 2024-05-06 (×2): 8.6 mg via ORAL
  Filled 2024-05-04 (×2): qty 1

## 2024-05-04 MED ORDER — FLUTICASONE PROPIONATE 50 MCG/ACT NA SUSP
1.0000 | Freq: Every day | NASAL | Status: DC | PRN
  Filled 2024-05-04: qty 16

## 2024-05-04 MED ORDER — SODIUM CHLORIDE 0.9% IV SOLUTION: Freq: Once | INTRAVENOUS | Status: AC

## 2024-05-04 MED ORDER — CLOTRIMAZOLE 1 % EX CREA
TOPICAL_CREAM | Freq: Two times a day (BID) | CUTANEOUS | Status: DC
  Filled 2024-05-04: qty 15

## 2024-05-04 MED ORDER — MUSCLE RUB 10-15 % EX CREA
1.0000 | TOPICAL_CREAM | Freq: Two times a day (BID) | CUTANEOUS | Status: DC | PRN
  Filled 2024-05-04: qty 85

## 2024-05-04 MED ORDER — PANTOPRAZOLE SODIUM 40 MG IV SOLR
40.0000 mg | Freq: Once | INTRAVENOUS | Status: AC
  Administered 2024-05-04: 40 mg via INTRAVENOUS
  Filled 2024-05-04: qty 10

## 2024-05-04 MED ORDER — ACETAMINOPHEN 500 MG PO TABS
1000.0000 mg | ORAL_TABLET | Freq: Three times a day (TID) | ORAL | Status: DC | PRN
  Administered 2024-05-05 – 2024-05-08 (×5): 1000 mg via ORAL
  Filled 2024-05-04 (×5): qty 2

## 2024-05-04 MED ORDER — VITAMIN C 500 MG PO TABS
1000.0000 mg | ORAL_TABLET | Freq: Every day | ORAL | Status: DC
  Administered 2024-05-04 – 2024-05-08 (×5): 1000 mg via ORAL
  Filled 2024-05-04 (×5): qty 2

## 2024-05-04 MED ORDER — OXYCODONE HCL 5 MG PO TABS
5.0000 mg | ORAL_TABLET | Freq: Four times a day (QID) | ORAL | Status: DC | PRN
  Administered 2024-05-05 – 2024-05-08 (×2): 5 mg via ORAL
  Filled 2024-05-04 (×3): qty 1

## 2024-05-04 MED ORDER — FERRIC CITRATE 1 GM 210 MG(FE) PO TABS
210.0000 mg | ORAL_TABLET | Freq: Three times a day (TID) | ORAL | Status: DC
  Administered 2024-05-05 – 2024-05-09 (×13): 210 mg via ORAL
  Filled 2024-05-04 (×13): qty 1

## 2024-05-04 MED ORDER — POLYVINYL ALCOHOL 1.4 % OP SOLN
1.0000 [drp] | Freq: Three times a day (TID) | OPHTHALMIC | Status: DC | PRN
  Filled 2024-05-04 (×2): qty 15

## 2024-05-04 MED ORDER — ROSUVASTATIN CALCIUM 5 MG PO TABS
10.0000 mg | ORAL_TABLET | Freq: Every day | ORAL | Status: DC
  Administered 2024-05-05 – 2024-05-09 (×5): 10 mg via ORAL
  Filled 2024-05-04 (×5): qty 2

## 2024-05-04 MED ORDER — CAMPHOR-MENTHOL 0.5-0.5 % EX LOTN
1.0000 | TOPICAL_LOTION | CUTANEOUS | Status: DC | PRN
  Filled 2024-05-04: qty 222

## 2024-05-04 MED ORDER — PANTOPRAZOLE SODIUM 40 MG IV SOLR
40.0000 mg | Freq: Two times a day (BID) | INTRAVENOUS | Status: DC
  Administered 2024-05-05 (×2): 40 mg via INTRAVENOUS
  Filled 2024-05-04 (×2): qty 10

## 2024-05-04 MED ORDER — VITAMIN E 180 MG (400 UNIT) PO CAPS
800.0000 [IU] | ORAL_CAPSULE | Freq: Every morning | ORAL | Status: DC
  Administered 2024-05-06 – 2024-05-09 (×4): 800 [IU] via ORAL
  Filled 2024-05-04 (×5): qty 2

## 2024-05-04 MED ORDER — POLYETHYLENE GLYCOL 3350 17 G PO PACK: 17.0000 g | PACK | Freq: Two times a day (BID) | ORAL | Status: DC | PRN

## 2024-05-04 NOTE — H&P (Addendum)
 Date: 05/05/2024               Patient Name:  Anna Carr MRN: [id number]  DOB: Jul 06, 1941 Age / Sex: 83 y.o., female   PCP: Thurmond Cathlyn LABOR., MD         Medical Service: Internal Medicine Teaching Service         Attending Physician: Dr. Ronnald Sergeant      First Contact: Remonia Romano, DO}    Second Contact: Dr. Ozell Nearing, DO          Pager Information: First Contact Pager: [phone]   Second Contact Pager: [phone]   SUBJECTIVE   Chief Complaint: bloody stools  History of Present Illness: Anna Carr is a 83 y.o. female with PMH of recurrent GI bleeds with duodenal AVMs, ESRD on HD (TTS), HFpEF, PAD with RLE critical limb ischemia without revascularization option, who presents to the ED for bright red blood per rectum. She was previously admitted here on 5/8-5/19 and 6/23-6/25 for GI bleeds and symptomatic anemia, whose hospital course was complicated by bradycardic arrest during EGD in May admission. She was discharged on 6/25 after her anemia was corrected and stable after 1u pRBCs. Her anemia was attributed to duodenal AVMs seen during EGD in early May, which was treated with APC. Due to her significant reaction to anesthesia, no further intervention was pursued at that time. She was discharged with protonix  40mg  BID, Lasix  80mg  daily, and Carvedilol  6.25mg  BID.  During her 6/30-7/5 admission, the patient was seen for presyncope secondary to her chronic anemia and critical limb ischemia of the right lower extremity, where she was followed by vascular surgery for revascularization vs medical management. She was discharged on DAPT of ASA 81mg  and Plavix  75mg  after shared decision making, discussing risks and benefits of medical management.   On Monday 7/7, the patient reports feeling weak post-hospitalization, which continued into Tuesday 7/8 after she went to outpatient dialysis. Her daughter, who is bedside, reports the patient had a bloody stool at dialysis. Due to her  weakness, the patient went to [facility] and was discharged. The patient had multiple bloody stools subsequent to that, with the last one being last night on 7/9. The patient stopped taking her ASA 81mg  and Plavix  75mg  on 7/9, and informed Dr. Silver with [facility]. Patient received dialysis today and reports continued weakness, which prompted her to come to the ED at [facility]. FOBT in ED was positive.    ED Course: Labs significant for Hgb 7.2, MCV 102.7, FOBT positive Imaging: EKG showing sinus arrhythmia, similar to her previous EKG Received 1u pRBC   Meds:  Patient reported:  Acetaminophen  500mg  q6h PRN Ascorbic acid  1000mg  nightly Carvedilol  6.25 BID Furosemide  80mg , daily Pantoprazole  40mg  BID Insulin  Glargine 6u qAM Auryxia  210mg  TID Meloxicam 7.5mg  BID PRN Polyethylene Glycol 17g packet BID PRN Rosuvastatin  10mg  Vitamin B12 1000mc daily  Recently stopped ASA 81mg  and Plavix  75mg    Past Medical History Symptomatic anemia HFpEF T2DM ESRD on HD (TTS) HTN Duodenal AVMs with recent upper GI bleed PAD with right lower extremity critical limb ischemia  Past Surgical History Past Surgical History:  Procedure Laterality Date   A/V FISTULAGRAM Left 02/17/2021   Procedure: A/V FISTULAGRAM;  Surgeon: Sheree Penne Bruckner, MD;  Location: [facility] INVASIVE CV LAB;  Service: Cardiovascular;  Laterality: Left;   APPENDECTOMY     AV FISTULA PLACEMENT Left    AV FISTULA PLACEMENT Left 03/12/2021   Procedure: INSERTION OF LEFT UPPER ARM  ARTERIOVENOUS (AV) GORE-TEX GRAFT;  Surgeon: Sheree Penne Bruckner, MD;  Location: [facility] OR;  Service: Vascular;  Laterality: Left;   BALLOON ENTEROSCOPY  03/02/2024   Procedure: ENTEROSCOPY, USING BALLOON;  Surgeon: San Sandor GAILS, DO;  Location: [facility] ENDOSCOPY;  Service: Gastroenterology;;   COLONOSCOPY WITH PROPOFOL  N/A 03/18/2021   Procedure: COLONOSCOPY WITH PROPOFOL ;  Surgeon: Legrand Victory LITTIE DOUGLAS, MD;  Location: [facility] ENDOSCOPY;  Service:  Gastroenterology;  Laterality: N/A;   CYSTECTOMY     ENTEROSCOPY N/A 03/21/2021   Procedure: ENTEROSCOPY;  Surgeon: Legrand Victory LITTIE DOUGLAS, MD;  Location: [facility] ENDOSCOPY;  Service: Gastroenterology;  Laterality: N/A;   ENTEROSCOPY N/A 12/20/2021   Procedure: ENTEROSCOPY;  Surgeon: Legrand Victory LITTIE DOUGLAS, MD;  Location: [facility] ENDOSCOPY;  Service: Gastroenterology;  Laterality: N/A;   ENTEROSCOPY N/A 02/26/2024   Procedure: ENTEROSCOPY;  Surgeon: Albertus Gordy HERO, MD;  Location: [facility] ENDOSCOPY;  Service: Gastroenterology;  Laterality: N/A;   ENTEROSCOPY N/A 02/28/2024   Procedure: ENTEROSCOPY;  Surgeon: San Sandor GAILS, DO;  Location: [facility] ENDOSCOPY;  Service: Gastroenterology;  Laterality: N/A;   ESOPHAGOGASTRODUODENOSCOPY (EGD) WITH PROPOFOL  N/A 03/18/2021   Procedure: ESOPHAGOGASTRODUODENOSCOPY (EGD) WITH PROPOFOL ;  Surgeon: Legrand Victory LITTIE DOUGLAS, MD;  Location: [facility] ENDOSCOPY;  Service: Gastroenterology;  Laterality: N/A;   GIVENS CAPSULE STUDY N/A 03/19/2021   Procedure: GIVENS CAPSULE STUDY;  Surgeon: Legrand Victory LITTIE DOUGLAS, MD;  Location: [facility] ENDOSCOPY;  Service: Gastroenterology;  Laterality: N/A;   GIVENS CAPSULE STUDY N/A 02/29/2024   Procedure: IMAGING PROCEDURE, GI TRACT, INTRALUMINAL, VIA CAPSULE;  Surgeon: San Sandor GAILS, DO;  Location: [facility] ENDOSCOPY;  Service: Gastroenterology;  Laterality: N/A;   HEMOSTASIS CLIP PLACEMENT  12/20/2021   Procedure: HEMOSTASIS CLIP PLACEMENT;  Surgeon: Legrand Victory LITTIE DOUGLAS, MD;  Location: [facility] ENDOSCOPY;  Service: Gastroenterology;;   HOT HEMOSTASIS N/A 03/21/2021   Procedure: HOT HEMOSTASIS (ARGON PLASMA COAGULATION/BICAP);  Surgeon: Legrand Victory LITTIE DOUGLAS, MD;  Location: [facility] ENDOSCOPY;  Service: Gastroenterology;  Laterality: N/A;   HOT HEMOSTASIS N/A 12/20/2021   Procedure: HOT HEMOSTASIS (ARGON PLASMA COAGULATION/BICAP);  Surgeon: Legrand Victory LITTIE DOUGLAS, MD;  Location: [facility] ENDOSCOPY;  Service: Gastroenterology;  Laterality: N/A;   HOT HEMOSTASIS N/A 03/02/2024   Procedure: EGD, WITH ARGON  PLASMA COAGULATION;  Surgeon: San Sandor GAILS, DO;  Location: [facility] ENDOSCOPY;  Service: Gastroenterology;  Laterality: N/A;   HYSTEROSCOPY     IR THROMBECTOMY AV FISTULA W/THROMBOLYSIS/PTA INC/SHUNT/IMG LEFT Left 04/12/2024   IR US  GUIDE VASC ACCESS LEFT  04/12/2024   LOWER EXTREMITY ANGIOGRAPHY N/A 02/03/2024   Procedure: Lower Extremity Angiography;  Surgeon: Court Dorn PARAS, MD;  Location: [facility] INVASIVE CV LAB;  Service: Cardiovascular;  Laterality: N/A;   POLYPECTOMY  03/18/2021   Procedure: POLYPECTOMY;  Surgeon: Legrand Victory LITTIE DOUGLAS, MD;  Location: [facility] ENDOSCOPY;  Service: Gastroenterology;;     Social:  Lives With: son but daughter helps as well Occupation: retired Support: family Level of Function: dependent on mostly all ADLs and IADLs PCP:  Thurmond Cathlyn LABOR., MD  Substances: denies   Family History:  Family History  Problem Relation Age of Onset   Diabetes Mother    Hypertension Mother    Diabetes Maternal Grandfather    Liver disease Other      Allergies: Allergies as of 05/04/2024 - Review Complete 05/04/2024  Allergen Reaction Noted   Atorvastatin  Other (See Comments) 04/10/2024   Wound dressing adhesive Rash 04/24/2024    Review of Systems: A complete ROS was negative except as per HPI.   OBJECTIVE:  Physical Exam: Blood pressure (!) 106/57, pulse 90, temperature 97.8 F (36.6 C), temperature source Temporal, resp. rate (!) 25, height 5' 1 (1.549 m), weight 69.4 kg, SpO2 99%.  Constitutional: well-appearing, laying in bed, in no acute distress HENT: normocephalic atraumatic, mucous membranes moist Eyes: conjunctiva non-erythematous Neck: supple Cardiovascular: regular rate and rhythm, no m/r/g, bilateral 2+ lower extremity edema, weak left DP pulse and no palpable right DP/PT pulses Pulmonary/Chest: normal work of breathing on room air, diminished lungs sounds on bilateral bases  Abdominal: soft, non-tender, non-distended Neurological: alert & oriented x  3 Skin: right-sided lower extremity wound on anterior shin, with right toes appearing red and well-demarcated. Cool to touch below the knee bilaterally, right sided heel ulcer (covered) Psych: appropriate  Labs: CBC    Component Value Date/Time   WBC 4.9 05/04/2024 1750   RBC 2.24 (L) 05/04/2024 1750   HGB 7.2 (L) 05/04/2024 1750   HGB 11.3 01/12/2024 1259   HCT 23.0 (L) 05/04/2024 1750   HCT 35.0 01/12/2024 1259   PLT 202 05/04/2024 1750   PLT 208 01/12/2024 1259   MCV 102.7 (H) 05/04/2024 1750   MCV 99 (H) 01/12/2024 1259   MCH 32.1 05/04/2024 1750   MCHC 31.3 05/04/2024 1750   RDW 18.3 (H) 05/04/2024 1750   RDW 15.7 (H) 01/12/2024 1259   LYMPHSABS 0.9 04/24/2024 2145   LYMPHSABS 0.9 01/12/2024 1259   MONOABS 0.4 04/24/2024 2145   EOSABS 0.1 04/24/2024 2145   EOSABS 0.1 01/12/2024 1259   BASOSABS 0.1 04/24/2024 2145   BASOSABS 0.0 01/12/2024 1259     CMP     Component Value Date/Time   NA 135 05/04/2024 1750   NA 134 01/12/2024 1259   K 3.9 05/04/2024 1750   CL 93 (L) 05/04/2024 1750   CO2 27 05/04/2024 1750   GLUCOSE 163 (H) 05/04/2024 1750   BUN 27 (H) 05/04/2024 1750   BUN 31 (H) 01/12/2024 1259   CREATININE 3.14 (H) 05/04/2024 1750   CALCIUM  9.0 05/04/2024 1750   PROT 6.0 (L) 05/04/2024 1750   ALBUMIN  3.0 (L) 05/04/2024 1750   AST 48 (H) 05/04/2024 1750   ALT 31 05/04/2024 1750   ALKPHOS 108 05/04/2024 1750   BILITOT 1.2 05/04/2024 1750   GFRNONAA 14 (L) 05/04/2024 1750    Imaging: No results found.   EKG: personally reviewed my interpretation is sinus arrhythmia. Prior EKG similar on 6/30.  ASSESSMENT & PLAN:   Assessment & Plan by Problem: Principal Problem:   Acute blood loss anemia   Anna Carr is a 83 y.o. person living with a history of recurrent GI bleeds with duodenal AVMs, ESRD on HD (TTS), HFpEF, PAD with RLE critical limb ischemia without revascularization option, who presents to the ED for bright red blood per rectum and  admitted for acute blood loss anemia  on 7/10.   Acute Blood Loss Anemia Hematochezia Duodenal AVMs Patient has a history of GI bleed complicated by duodenal AVMs, which has not been treated due to bradycardic arrest during EGD. At discharge on 7/5, Hgb was 9.3 Hgb in ED today was 7.2. Daughter showed picture of patient's recent stool, which was dark in color with bright red streaks. Patient required 1u pRBC today. Anemia likely worsened by bleeding 2/2 DAPT therapy use. Stopped DAPT on 7/9. Pt has history of macrocytic anemia, likely due to chronic disease and CKD.  Currently receiving 1 unit PRBCs. She is in a tricky situation with need for DAPT, recent bradycardic  arrest during EGD, and recurrent need for blood transfusions likely due to bleeding AVMs.  Discussed this with the patient and her daughter who understand that they will need to make a decision about if they would like to take the risk of repeat EGD/colonoscopy.  After this we will also need to make the decision whether or not to continue DAPT.  She would like to have this discussion with an additional family member who was not here. - Follow-up posttransfusion H&H (may be timed similar to morning CBC, if so we will just get the morning CBC) - Hold aspirin  and Plavix  - Clear liquid diet, can change to regular if no plan for EGD/colonoscopy - Telemetry - CBC, BMP, PT/INR - GI consult tomorrow pending risks/benefits discussion - I&Os - Transfuse if Hgb < 7 or in setting of acute blood loss - Page for any acute hemodynamic changes, hematochezia, hematemesis, or large-volume melena  Right Lower Extremity Critical Limb Ischemia PAD Patient's DP/PT pulses on RLE were not palpable on prior admission or during this one. Dr. Lanis with vascular surgery previously saw pt and started her on DAPT with plans for outpatient follow-up since she at high risk for complications with revascularization without amenable vein graft.  She has continued  antibiotics since last admission for suspected cellulitis.  Patient's cellulitis improved since prior admission and we are still unsure if this is due to poor blood flow more so than infection. Patient is complaining of some pain in the RLE.  - Wound care - Hold DAPT due to GI bleed - Tylenol  and Oxycodone  5mg  q6h PRN  Hypertension Patient's blood pressure has tendency to run on the hypotensive side during previous hospital admission. BP in ED 106/57. Pt went to HD today, will monitor - Hold home carvedilol    ESRD on HD TTS Patient had HD today. Will continue HD TTS schedule.  - Monitor CBC and Renal Function Panel - Consult nephrology in the morning  Type 2 Diabetes Home medication includes Soliqua  6u each morning. Last A1c 5.8 on 03/15/2024. - Sensitive SSI while on clear liquid diet  HFpEF Patient has some volume overload with lower extremity edema but no dyspnea. Had HD today. Has been taking Lasix  80mg  since discharge. She is due for HD on Saturday 7/12. Echocardiogram on 04/10/2024 showed LVEF of 50-55% with LVH. Bilateral atria dilated. Moderate mitral and triscuspid regurgitation. Calcification of aortic valve. - Resume Lasix  80mg  - Monitor I&Os  Best practice: Diet: CLD VTE: SCDs Code: Full  Disposition planning: Prior to Admission Living Arrangement: Home, living with son Anticipated Discharge Location: Home  Dispo: Admit patient to Inpatient with expected length of stay greater than 2 midnights.  Signed: Treshawn Allen, DO Internal Medicine Resident, PGY-1 Please contact the on call pager at [phone] for any urgent or emergent needs. 12:45 AM 05/05/2024

## 2024-05-04 NOTE — ED Triage Notes (Signed)
 Pt arrives [facility] from home with complaints of bloody stools since Tuesday. Pt seen at [facility] Tuesday and was discharged. Pt received dialysis today and is complaining of increased weakness.   EMS Vitals BP: 104/42 SPO2: 94 Room Air  HR: 70 afib  RR: 14 CBG: 178

## 2024-05-04 NOTE — ED Notes (Signed)
 CCMD contacted

## 2024-05-05 DIAGNOSIS — K921 Melena: Secondary | ICD-10-CM

## 2024-05-05 DIAGNOSIS — I5030 Unspecified diastolic (congestive) heart failure: Secondary | ICD-10-CM | POA: Diagnosis not present

## 2024-05-05 DIAGNOSIS — D62 Acute posthemorrhagic anemia: Secondary | ICD-10-CM | POA: Diagnosis not present

## 2024-05-05 DIAGNOSIS — K5520 Angiodysplasia of colon without hemorrhage: Secondary | ICD-10-CM | POA: Diagnosis not present

## 2024-05-05 DIAGNOSIS — D631 Anemia in chronic kidney disease: Secondary | ICD-10-CM

## 2024-05-05 DIAGNOSIS — K922 Gastrointestinal hemorrhage, unspecified: Secondary | ICD-10-CM | POA: Diagnosis not present

## 2024-05-05 DIAGNOSIS — L97419 Non-pressure chronic ulcer of right heel and midfoot with unspecified severity: Secondary | ICD-10-CM | POA: Diagnosis not present

## 2024-05-05 DIAGNOSIS — N186 End stage renal disease: Secondary | ICD-10-CM | POA: Diagnosis not present

## 2024-05-05 DIAGNOSIS — K31819 Angiodysplasia of stomach and duodenum without bleeding: Secondary | ICD-10-CM

## 2024-05-05 DIAGNOSIS — I70234 Atherosclerosis of native arteries of right leg with ulceration of heel and midfoot: Secondary | ICD-10-CM | POA: Diagnosis not present

## 2024-05-05 LAB — TYPE AND SCREEN
ABO/RH(D): A POS
Antibody Screen: NEGATIVE
Unit division: 0

## 2024-05-05 LAB — BPAM RBC
Blood Product Expiration Date: 202508092359
ISSUE DATE / TIME: 202507102200
Unit Type and Rh: 6200

## 2024-05-05 LAB — CBC WITH DIFFERENTIAL/PLATELET
Abs Immature Granulocytes: 0.01 K/uL (ref 0.00–0.07)
Basophils Absolute: 0.1 K/uL (ref 0.0–0.1)
Basophils Relative: 1 %
Eosinophils Absolute: 0 K/uL (ref 0.0–0.5)
Eosinophils Relative: 1 %
HCT: 25.1 % — ABNORMAL LOW (ref 36.0–46.0)
Hemoglobin: 7.9 g/dL — ABNORMAL LOW (ref 12.0–15.0)
Immature Granulocytes: 0 %
Lymphocytes Relative: 22 %
Lymphs Abs: 1.2 K/uL (ref 0.7–4.0)
MCH: 31.1 pg (ref 26.0–34.0)
MCHC: 31.5 g/dL (ref 30.0–36.0)
MCV: 98.8 fL (ref 80.0–100.0)
Monocytes Absolute: 0.7 K/uL (ref 0.1–1.0)
Monocytes Relative: 13 %
Neutro Abs: 3.6 K/uL (ref 1.7–7.7)
Neutrophils Relative %: 63 %
Platelets: 182 K/uL (ref 150–400)
RBC: 2.54 MIL/uL — ABNORMAL LOW (ref 3.87–5.11)
RDW: 19.9 % — ABNORMAL HIGH (ref 11.5–15.5)
WBC: 5.6 K/uL (ref 4.0–10.5)
nRBC: 0.4 % — ABNORMAL HIGH (ref 0.0–0.2)

## 2024-05-05 LAB — RENAL FUNCTION PANEL
Albumin: 2.7 g/dL — ABNORMAL LOW (ref 3.5–5.0)
Anion gap: 15 (ref 5–15)
BUN: 34 mg/dL — ABNORMAL HIGH (ref 8–23)
CO2: 27 mmol/L (ref 22–32)
Calcium: 9.1 mg/dL (ref 8.9–10.3)
Chloride: 94 mmol/L — ABNORMAL LOW (ref 98–111)
Creatinine, Ser: 3.8 mg/dL — ABNORMAL HIGH (ref 0.44–1.00)
GFR, Estimated: 11 mL/min — ABNORMAL LOW (ref >60)
Glucose, Bld: 120 mg/dL — ABNORMAL HIGH (ref 70–99)
Phosphorus: 5.1 mg/dL — ABNORMAL HIGH (ref 2.5–4.6)
Potassium: 4.3 mmol/L (ref 3.5–5.1)
Sodium: 136 mmol/L (ref 135–145)

## 2024-05-05 LAB — PROTIME-INR
INR: 1.5 — ABNORMAL HIGH (ref 0.8–1.2)
Prothrombin Time: 18.5 s — ABNORMAL HIGH (ref 11.4–15.2)

## 2024-05-05 LAB — HEMOGLOBIN AND HEMATOCRIT, BLOOD
HCT: 25.5 % — ABNORMAL LOW (ref 36.0–46.0)
Hemoglobin: 8.1 g/dL — ABNORMAL LOW (ref 12.0–15.0)

## 2024-05-05 LAB — CBG MONITORING, ED: Glucose-Capillary: 105 mg/dL — ABNORMAL HIGH (ref 70–99)

## 2024-05-05 LAB — GLUCOSE, CAPILLARY
Glucose-Capillary: 143 mg/dL — ABNORMAL HIGH (ref 70–99)
Glucose-Capillary: 131 mg/dL — ABNORMAL HIGH (ref 70–99)
Glucose-Capillary: 135 mg/dL — ABNORMAL HIGH (ref 70–99)

## 2024-05-05 MED ORDER — ALBUMIN HUMAN 25 % IV SOLN: 25.0000 g | INTRAVENOUS | Status: DC | PRN

## 2024-05-05 MED ORDER — ALBUMIN HUMAN 25 % IV SOLN: 25.0000 g | INTRAVENOUS | Status: AC | PRN

## 2024-05-05 MED ORDER — CHLORHEXIDINE GLUCONATE CLOTH 2 % EX PADS: 6.0000 | MEDICATED_PAD | Freq: Every day | CUTANEOUS | Status: DC

## 2024-05-05 MED ORDER — INSULIN ASPART 100 UNIT/ML IJ SOLN
0.0000 [IU] | Freq: Three times a day (TID) | INTRAMUSCULAR | Status: DC
  Administered 2024-05-05 (×2): 1 [IU] via SUBCUTANEOUS
  Administered 2024-05-06 – 2024-05-08 (×6): 2 [IU] via SUBCUTANEOUS
  Administered 2024-05-08: 3 [IU] via SUBCUTANEOUS
  Administered 2024-05-09: 2 [IU] via SUBCUTANEOUS

## 2024-05-05 MED ORDER — ZINC OXIDE 40 % EX OINT
TOPICAL_OINTMENT | Freq: Two times a day (BID) | CUTANEOUS | Status: DC
  Filled 2024-05-05: qty 57

## 2024-05-05 MED ORDER — MEDIHONEY WOUND/BURN DRESSING EX PSTE
1.0000 | PASTE | Freq: Every day | CUTANEOUS | Status: DC
  Administered 2024-05-05 – 2024-05-09 (×4): 1 via TOPICAL
  Filled 2024-05-05: qty 44

## 2024-05-05 MED ORDER — DARBEPOETIN ALFA 200 MCG/0.4ML IJ SOSY
200.0000 ug | PREFILLED_SYRINGE | INTRAMUSCULAR | Status: DC
  Administered 2024-05-05: 200 ug via SUBCUTANEOUS
  Filled 2024-05-05: qty 0.4

## 2024-05-05 NOTE — Consult Note (Cosign Needed)
 Hospital Consult    Reason for Consult:  R foot wounds Requesting Physician:  Internal Medicine MRN #:  [id number]  History of Present Illness: This is a 83 y.o. female being seen in consultation for evaluation of wounds of the right heel and foot.  She was evaluated by our service during her admission last week.  She was noted to have PAD with right foot redness and heel wounds at the time.  Plan was to see her in the outpatient setting if she was able to tolerate aspirin  and Plavix  given her history of GI bleed.  Unfortunately she presented to the emergency department again last night with hematochezia.  Her daughter is present during exam today.  They state that they believe the wounds of the right foot and heel have improved after taking antibiotics and diuresis.  She denies any significant pain in her right foot.  Vein mapping demonstrates an inadequate GSV for conduit use in the right leg.  She was also deemed a high risk for open surgery by the cardiology service during last admission.  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 difficile colitis 03/30/2021   Cardiac arrest ([name]) 03/02/2024   Cellulitis 03/16/2021   CHF with left ventricular diastolic dysfunction, NYHA class 1 ([name]) 02/02/2017   Cholelithiasis 03/30/2021   Chronic  constipation 12/22/2015   Chronic laryngitis 02/17/2017   Last Assessment & Plan:   Formatting of this note might be different from the original.  Evaluation of larynx.  Was hospitalized for pneumonia and during that time she started noticing worsening hoarseness.  Denies any throat pain.  No obvious heartburn.  She did recently start PPI therapy.  She smoked in the past.  Denies any difficulty swallowing.  Hoarseness is improving.  EXAM shows mildly ras   Coagulation defect, unspecified ([name]) 12/07/2019   Colon polyp    Complication of vascular dialysis catheter 03/05/2020   Contact with and (suspected) exposure to covid-19 10/28/2020   COPD mixed type ([name])    Cough, unspecified 10/28/2020   COVID-19 10/29/2020   Depression    Diverticulosis of large intestine without perforation or abscess without bleeding 01/08/2020   Dyspnea on exertion    Dyspnea, unspecified 11/02/2019   Elevated troponin 03/16/2021   Encounter for removal of sutures 12/28/2019   Encounter for screening for respiratory tuberculosis 12/06/2019   ESRD (end stage renal disease) ([name]) 07/18/2019   ESRD on dialysis ([name]) 09/18/2019   ESRD on hemodialysis ([name]) 01/02/2020   Fever, unspecified 01/23/2020   GI bleed 12/19/2021   GIB (gastrointestinal bleeding) 01/02/2020   Heme positive stool    High risk medication use 12/22/2015   History of blood transfusion    History of colonic polyps 01/08/2020   HLD (hyperlipidemia) 12/22/2015   Hypertension    Hypervolemia 11/13/2019   Hypokalemia 03/11/2019   Hyponatremia 03/11/2019  Hypovolemia 10/27/2021   Hypoxia 03/11/2019   Insomnia    Insulin  long-term use ([name]) 12/22/2015   Iron  deficiency anemia, unspecified 12/06/2019   Major depressive disorder, recurrent ([name]) 12/22/2015   Malaise and fatigue 12/23/2015   Mild protein-calorie malnutrition ([name]) 03/26/2020   Mitral regurgitation moderate to severe based on echocardiogram from 2022 11/03/2021    Musculoskeletal chest pain 03/03/2024   Osteoarthritis    Osteopenia    Other disorders of phosphorus metabolism 01/08/2020   PAC (premature atrial contraction) 10/04/2020   Pain, unspecified 12/06/2019   PAT (paroxysmal atrial tachycardia) ([name]) 10/04/2020   Peripheral vascular disease, unspecified ([name]) critical stenosis lower extremity 12/01/2023   Peripheral arterial disease     Pneumonia    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  Chodri CT annually.     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 dialysis, with long-term current use of insulin  ([name]) 12/23/2015   Type 2 diabetes mellitus, with long-term current use of insulin  ([name]) 03/11/2019   Unspecified hemorrhoids 01/08/2020    Past Surgical History:  Procedure Laterality Date   A/V FISTULAGRAM Left 02/17/2021   Procedure: A/V FISTULAGRAM;  Surgeon: Sheree Penne Bruckner, MD;  Location: [facility] INVASIVE CV LAB;  Service: Cardiovascular;  Laterality: Left;   APPENDECTOMY     AV FISTULA PLACEMENT Left    AV FISTULA PLACEMENT Left 03/12/2021   Procedure: INSERTION OF LEFT UPPER ARM ARTERIOVENOUS (AV) GORE-TEX GRAFT;  Surgeon: Sheree Penne Bruckner, MD;  Location: [facility] OR;  Service: Vascular;  Laterality: Left;   BALLOON ENTEROSCOPY  03/02/2024   Procedure: ENTEROSCOPY, USING BALLOON;  Surgeon: San Sandor GAILS, DO;  Location: [facility] ENDOSCOPY;  Service: Gastroenterology;;   COLONOSCOPY WITH PROPOFOL  N/A 03/18/2021   Procedure: COLONOSCOPY WITH PROPOFOL ;  Surgeon: Legrand Victory LITTIE DOUGLAS, MD;  Location: [facility] ENDOSCOPY;  Service: Gastroenterology;  Laterality: N/A;   CYSTECTOMY     ENTEROSCOPY N/A 03/21/2021    Procedure: ENTEROSCOPY;  Surgeon: Legrand Victory LITTIE DOUGLAS, MD;  Location: [facility] ENDOSCOPY;  Service: Gastroenterology;  Laterality: N/A;   ENTEROSCOPY N/A 12/20/2021   Procedure: ENTEROSCOPY;  Surgeon: Legrand Victory LITTIE DOUGLAS, MD;  Location: [facility] ENDOSCOPY;  Service: Gastroenterology;  Laterality: N/A;   ENTEROSCOPY N/A 02/26/2024   Procedure: ENTEROSCOPY;  Surgeon: Albertus Gordy HERO, MD;  Location: [facility] ENDOSCOPY;  Service: Gastroenterology;  Laterality: N/A;   ENTEROSCOPY N/A 02/28/2024   Procedure: ENTEROSCOPY;  Surgeon: San Sandor GAILS, DO;  Location: [facility] ENDOSCOPY;  Service: Gastroenterology;  Laterality: N/A;   ESOPHAGOGASTRODUODENOSCOPY (EGD) WITH PROPOFOL  N/A 03/18/2021   Procedure: ESOPHAGOGASTRODUODENOSCOPY (EGD) WITH PROPOFOL ;  Surgeon: Legrand Victory LITTIE DOUGLAS, MD;  Location: [facility] ENDOSCOPY;  Service: Gastroenterology;  Laterality: N/A;   GIVENS CAPSULE STUDY N/A 03/19/2021   Procedure: GIVENS CAPSULE STUDY;  Surgeon: Legrand Victory LITTIE DOUGLAS, MD;  Location: [facility] ENDOSCOPY;  Service: Gastroenterology;  Laterality: N/A;   GIVENS CAPSULE STUDY N/A 02/29/2024   Procedure: IMAGING PROCEDURE, GI TRACT, INTRALUMINAL, VIA CAPSULE;  Surgeon: San Sandor GAILS, DO;  Location: [facility] ENDOSCOPY;  Service: Gastroenterology;  Laterality: N/A;   HEMOSTASIS CLIP PLACEMENT  12/20/2021   Procedure: HEMOSTASIS CLIP PLACEMENT;  Surgeon: Legrand Victory LITTIE DOUGLAS, MD;  Location: [facility] ENDOSCOPY;  Service: Gastroenterology;;   HOT HEMOSTASIS N/A 03/21/2021  Procedure: HOT HEMOSTASIS (ARGON PLASMA COAGULATION/BICAP);  Surgeon: Legrand Victory LITTIE DOUGLAS, MD;  Location: [facility] ENDOSCOPY;  Service: Gastroenterology;  Laterality: N/A;   HOT HEMOSTASIS N/A 12/20/2021   Procedure: HOT HEMOSTASIS (ARGON PLASMA COAGULATION/BICAP);  Surgeon: Legrand Victory LITTIE DOUGLAS, MD;  Location: [facility] ENDOSCOPY;  Service: Gastroenterology;  Laterality: N/A;   HOT HEMOSTASIS N/A 03/02/2024   Procedure: EGD, WITH ARGON PLASMA COAGULATION;  Surgeon: San Sandor GAILS, DO;  Location: [facility] ENDOSCOPY;  Service:  Gastroenterology;  Laterality: N/A;   HYSTEROSCOPY     IR THROMBECTOMY AV FISTULA W/THROMBOLYSIS/PTA INC/SHUNT/IMG LEFT Left 04/12/2024   IR US  GUIDE VASC ACCESS LEFT  04/12/2024   LOWER EXTREMITY ANGIOGRAPHY N/A 02/03/2024   Procedure: Lower Extremity Angiography;  Surgeon: Court Dorn PARAS, MD;  Location: [facility] INVASIVE CV LAB;  Service: Cardiovascular;  Laterality: N/A;   POLYPECTOMY  03/18/2021   Procedure: POLYPECTOMY;  Surgeon: Legrand Victory LITTIE DOUGLAS, MD;  Location: [facility] ENDOSCOPY;  Service: Gastroenterology;;    Allergies  Allergen Reactions   Atorvastatin  Other (See Comments)    Constipation   Wound Dressing Adhesive Rash    Prior to Admission medications   Medication Sig Start Date End Date Taking? Authorizing Provider  acetaminophen  (TYLENOL ) 500 MG tablet Take 500 mg by mouth every 4 (four) hours as needed for mild pain (pain score 1-3) or moderate pain (pain score 4-6).   Yes [provider]  ascorbic acid  (VITAMIN C ) 1000 MG tablet Take 1,000 mg by mouth at bedtime. 12/07/19  Yes [provider]  Biotin  5000 MCG TABS Take 5,000 mcg by mouth daily in the afternoon.   Yes [provider]  camphor-menthol  VIKKI) lotion Apply 1 Application topically as needed for itching. 04/29/24  Yes D'Mello, Rosalyn, DO  carboxymethylcellulose (REFRESH PLUS) 0.5 % SOLN Place 1 drop into both eyes 3 (three) times daily as needed (dry eyes).   Yes [provider]  cyanocobalamin  (VITAMIN B12) 1000 MCG tablet Take 1,000 mcg by mouth every evening.   Yes [provider]  ferric citrate  (AURYXIA ) 1 GM 210 MG(Fe) tablet Take 210 mg by mouth 3 (three) times daily with meals.   Yes [provider]  furosemide  (LASIX ) 80 MG tablet Take 80 mg by mouth daily in the afternoon. 04/03/21  Yes [provider]  hydrocortisone  1 % ointment Apply 1 Application topically 2 (two) times daily for 7 days. 04/29/24 05/06/24 Yes D'Mello, Rosalyn, DO  Insulin   Glargine-Lixisenatide  (SOLIQUA ) 100-33 UNT-MCG/ML SOPN Inject 6 Units into the skin in the morning. 03/11/24  Yes Zheng, Michael, DO  Lactobacillus (PROBIOTIC ACIDOPHILUS PO) Take 1 capsule by mouth in the morning.   Yes [provider]  lidocaine -prilocaine  (EMLA ) cream Apply 1 application topically as needed (prior to fisula access). Tuesday, Thursday, Saturday for dialysis 07/04/20  Yes [provider]  midodrine  (PROAMATINE ) 10 MG tablet Take 10 mg by mouth 3 (three) times daily. 05/04/24  Yes [provider]  mupirocin  ointment (BACTROBAN ) 2 % Apply 1 Application topically 2 (two) times daily. 04/21/24 05/05/24 Yes [provider]  Omega-3 Fatty Acids (FISH OIL PO) Take 1 capsule by mouth in the morning and at bedtime.   Yes [provider]  ondansetron  (ZOFRAN -ODT) 4 MG disintegrating tablet Take 4 mg by mouth every 8 (eight) hours as needed. 05/03/24  Yes [provider]  pantoprazole  (PROTONIX ) 40 MG tablet Take 1 tablet (40 mg total) by mouth 2 (two) times daily. 03/11/24  Yes Zheng, Michael, DO  polyethylene glycol (MIRALAX  /  GLYCOLAX ) 17 g packet Take 17 g by mouth 2 (two) times daily as needed for moderate constipation.   Yes [provider]  rosuvastatin  (CRESTOR ) 10 MG tablet Take 1 tablet (10 mg total) by mouth daily. 04/29/24  Yes D'Mello, Rosalyn, DO  senna (SENOKOT) 8.6 MG TABS tablet Take 1 tablet (8.6 mg total) by mouth daily as needed for mild constipation. 04/14/24  Yes Renne Homans, MD  Vitamin E  670 MG (1000 UT) CAPS Take 1,000 Units by mouth in the morning. 01/09/20  Yes [provider]  Zinc  50 MG TABS Take 50 mg by mouth daily.   Yes [provider]    Social History   Socioeconomic History   Marital status: Widowed    Spouse name: Not on file   Number of children: 3   Years of education: Not on file   Highest education level: Not on file  Occupational History   Not on file  Tobacco Use    Smoking status: Former    Current packs/day: 0.00    Types: Cigarettes    Quit date: 44    Years since quitting: 42.5   Smokeless tobacco: Never   Tobacco comments:    quit in 1973  Vaping Use   Vaping status: Never Used  Substance and Sexual Activity   Alcohol  use: Not Currently   Drug use: Never   Sexual activity: Not Currently  Other Topics Concern   Not on file  Social History Narrative   Not on file   Social Drivers of Health   Financial Resource Strain: Low Risk  (10/27/2021)   Received from [facility]   Overall Financial Resource Strain (CARDIA)    Difficulty of Paying Living Expenses: Not hard at all  Food Insecurity: No Food Insecurity (04/24/2024)   Hunger Vital Sign    Worried About Running Out of Food in the Last Year: Never true    Ran Out of Food in the Last Year: Never true  Transportation Needs: No Transportation Needs (04/24/2024)   PRAPARE - Administrator, Civil Service (Medical): No    Lack of Transportation (Non-Medical): No  Physical Activity: Not on file  Stress: No Stress Concern Present (10/27/2021)   Received from [facility] - Occupational Stress Questionnaire    Feeling of Stress : Not at all  Social Connections: Unknown (04/24/2024)   Social Connection and Isolation Panel    Frequency of Communication with Friends and Family: Twice a week    Frequency of Social Gatherings with Friends and Family: Twice a week    Attends Religious Services: Never    Database administrator or Organizations: No    Attends Banker Meetings: Never    Marital Status: Not on file  Recent Concern: Social Connections - Socially Isolated (04/18/2024)   Social Connection and Isolation Panel    Frequency of Communication with Friends and Family: Twice a week    Frequency of Social Gatherings with Friends and Family: Three times a week    Attends Religious Services: Never    Active Member of Clubs or  Organizations: No    Attends Banker Meetings: Never    Marital Status: Widowed  Intimate Partner Violence: Not At Risk (04/24/2024)   Humiliation, Afraid, Rape, and Kick questionnaire    Fear of Current or Ex-Partner: No    Emotionally Abused: No    Physically Abused: No    Sexually Abused:  No     Family History  Problem Relation Age of Onset   Diabetes Mother    Hypertension Mother    Diabetes Maternal Grandfather    Liver disease Other     ROS: Otherwise negative unless mentioned in HPI  Physical Examination  Vitals:   05/05/24 0934 05/05/24 1701  BP: (!) 98/53 (!) 90/43  Pulse: 93 (!) 54  Resp: 18 18  Temp: (!) 97.3 F (36.3 C) 97.6 F (36.4 C)  SpO2: 92% (!) 83%   Body mass index is 28.91 kg/m.  General:  WDWN in NAD Gait: Not observed HENT: WNL, normocephalic Pulmonary: normal non-labored breathing Cardiac: regular Abdomen:  soft, NT/ND, no masses Skin: without rashes Vascular Exam/Pulses: absent pedal pulses Extremities: Minimal erythema on the toes and right dorsal foot.  Small wound to right heel Musculoskeletal: no muscle wasting or atrophy  Neurologic: A&O X 3;  No focal weakness or paresthesias are detected; speech is fluent/normal Psychiatric:  The pt has Normal affect. Lymph:  Unremarkable  CBC    Component Value Date/Time   WBC 5.6 05/05/2024 0435   RBC 2.54 (L) 05/05/2024 0435   HGB 8.1 (L) 05/05/2024 1606   HGB 11.3 01/12/2024 1259   HCT 25.5 (L) 05/05/2024 1606   HCT 35.0 01/12/2024 1259   PLT 182 05/05/2024 0435   PLT 208 01/12/2024 1259   MCV 98.8 05/05/2024 0435   MCV 99 (H) 01/12/2024 1259   MCH 31.1 05/05/2024 0435   MCHC 31.5 05/05/2024 0435   RDW 19.9 (H) 05/05/2024 0435   RDW 15.7 (H) 01/12/2024 1259   LYMPHSABS 1.2 05/05/2024 0435   LYMPHSABS 0.9 01/12/2024 1259   MONOABS 0.7 05/05/2024 0435   EOSABS 0.0 05/05/2024 0435   EOSABS 0.1 01/12/2024 1259   BASOSABS 0.1 05/05/2024 0435   BASOSABS 0.0 01/12/2024  1259    BMET    Component Value Date/Time   NA 136 05/05/2024 0435   NA 134 01/12/2024 1259   K 4.3 05/05/2024 0435   CL 94 (L) 05/05/2024 0435   CO2 27 05/05/2024 0435   GLUCOSE 120 (H) 05/05/2024 0435   BUN 34 (H) 05/05/2024 0435   BUN 31 (H) 01/12/2024 1259   CREATININE 3.80 (H) 05/05/2024 0435   CALCIUM  9.1 05/05/2024 0435   GFRNONAA 11 (L) 05/05/2024 0435    COAGS: Lab Results  Component Value Date   INR 1.5 (H) 05/05/2024   INR 1.3 (H) 04/10/2024   INR 1.4 (H) 03/02/2024     Non-Invasive Vascular Imaging:   Multilevel PAD noted on angiography by Dr. Wadie in April of this year    ASSESSMENT/PLAN: This is a 83 y.o. female with PAD and right leg wounds  Ms. Newborn is an 83 year old female who returned to the ED last night and was admitted due to hematochezia.  She was evaluated by the vascular service during her admission last week.  Plan was to start her on aspirin  and Plavix  at discharge and follow-up in the outpatient setting to evaluate wounds and see if she could tolerate dual antiplatelet therapy.  Unfortunately she had bloody stools last night and aspirin  and Plavix  have been discontinued.  Fortunately for her the tissue changes to her right foot have drastically improved since last admission with the help of antibiotics and diuresis.  She is currently being worked up by GI and is considering endoscopy.  Dr. Lanis discussed at great length with the patient and her daughter that if she is unable to tolerate antiplatelets  then she is not a revascularization candidate.  She also had vein mapping during her prior admission which showed an inadequate GSV for conduit use.  Bypass using PTFE would not be durable given her inability to be maintained on a blood thinner.  She was also deemed high risk from a cardiac standpoint for bypass surgery.  The patient and daughter are also aware that if wounds were to worsen she would require an amputation.  On-call vascular surgeon  Dr. Serene will evaluate the patient later today and provide further treatment plans.   Donnice Sender PA-C [facility] [phone]   I agree with the above.  Continue to monitor her foot.  Currently being evaluated for a GI bleed Dr. Lanis will plan on reevaluation Monday.  Malvina Serene

## 2024-05-05 NOTE — Progress Notes (Addendum)
 HD#0 SUBJECTIVE:  Patient Summary: Anna Carr is a 83 y.o. person living with a history of recurrent GI bleeds with duodenal AVMs, ESRD on HD (TTS), HFpEF, PAD with RLE critical limb ischemia without revascularization option, who presents to the ED for bright red blood per rectum and admitted for acute blood loss anemia. Overnight Events: No overnight events   Interim History: Patient and daughter and son were all at bedside this morning. Daughter stated that patient started noting black tarry stools and bright red blood in the toilet bowl since Monday. Patient noted that her and her family decided this morning that they wanted to proceed with procedural intervention. It was discussed with patient and family that procedures would be high risk for patient given history due to bradycardic arrest. Patient and family would still like to have a conversation with GI. No other complaints at this time.   OBJECTIVE:  Vital Signs: Vitals:   05/05/24 0700 05/05/24 0730 05/05/24 0830 05/05/24 0934  BP: 96/60 111/63 102/65 (!) 98/53  Pulse: 94 88 82 93  Resp: 20 18 17 18   Temp:    (!) 97.3 F (36.3 C)  TempSrc:    Oral  SpO2:  100% 100% 92%  Weight:      Height:       Supplemental O2: Room Air SpO2: 92 %  Filed Weights   05/04/24 1744  Weight: 69.4 kg    No intake or output data in the 24 hours ending 05/05/24 1440 Net IO Since Admission: No IO data has been entered for this period [05/05/24 1440]  Physical Exam: Physical Exam Cardiovascular:     Rate and Rhythm: Normal rate and regular rhythm.     Heart sounds: Normal heart sounds.  Pulmonary:     Effort: Pulmonary effort is normal.     Breath sounds: Normal breath sounds.  Abdominal:     General: Bowel sounds are normal.     Palpations: Abdomen is soft.     Tenderness: There is no abdominal tenderness. There is no guarding or rebound.  Musculoskeletal:     Right lower leg: Edema present.     Left lower leg: Edema  present.     Comments: Patient's foot is red and erythematous. Patient has pitting pedal edema in right foot. No TP or DP palpated.   Neurological:     Mental Status: She is alert.     Patient Lines/Drains/Airways Status     Active Line/Drains/Airways     Name Placement date Placement time Site Days   Peripheral IV 05/04/24 22 G 1.75 Right;Anterior Forearm 05/04/24  1825  Forearm  1   Peripheral IV 05/04/24 22 G Posterior;Right Forearm 05/04/24  2240  Forearm  1   Fistula / Graft Left Upper arm Arteriovenous vein graft 03/12/21  1254  Upper arm  1150   Wound / Incision (Open or Dehisced) 02/26/24 Other (Comment) Knee Right;Left;Anterior bilateral knee abrasion 02/26/24  1900  Knee  69   Wound 04/10/24 Other (Comment) Leg Right 04/10/24  --  Leg  25   Wound 04/12/24 0534 Other (Comment) Arm Lower;Posterior;Proximal;Right 04/12/24  0534  Arm  23   Wound 04/12/24 0536 Other (Comment) Thigh Posterior;Proximal;Right 04/12/24  0536  Thigh  23   Wound 04/12/24 0539 Other (Comment) Pretibial Distal;Right 04/12/24  0539  Pretibial  23   Wound 04/12/24 0541 Pressure Injury Heel Posterior;Right Stage 2 -  Partial thickness loss of dermis presenting as a shallow open injury with a  red, pink wound bed without slough. 04/12/24  0541  Heel  23   Wound 04/12/24 0542 Pressure Injury Heel Left;Posterior Stage 1 -  Intact skin with non-blanchable redness of a localized area usually over a bony prominence. 04/12/24  0542  Heel  23   Wound 04/12/24 0543 Other (Comment) Toe (Comment  which one) Anterior;Right 04/12/24  0543  Toe (Comment  which one)  23   Wound 04/12/24 0544 Other (Comment) Toe (Comment  which one) Anterior;Right 04/12/24  0544  Toe (Comment  which one)  23   Wound 04/12/24 1322 Other (Comment) Arm Anterior;Left;Lower 04/12/24  1322  Arm  23   Wound 05/05/24 0930 Foot Anterior;Right 05/05/24  0930  Foot  less than 1            Pertinent labs and imaging:      Latest Ref Rng & Units  05/05/2024    4:35 AM 05/04/2024    5:50 PM 04/29/2024    5:25 AM  CBC  WBC 4.0 - 10.5 K/uL 5.6  4.9  7.2   Hemoglobin 12.0 - 15.0 g/dL 7.9  7.2  9.3   Hematocrit 36.0 - 46.0 % 25.1  23.0  28.4   Platelets 150 - 400 K/uL 182  202  226        Latest Ref Rng & Units 05/05/2024    4:35 AM 05/04/2024    5:50 PM 04/29/2024    5:25 AM  CMP  Glucose 70 - 99 mg/dL 879  836  870   BUN 8 - 23 mg/dL 34  27  42   Creatinine 0.44 - 1.00 mg/dL 6.19  6.85  4.61   Sodium 135 - 145 mmol/L 136  135  126   Potassium 3.5 - 5.1 mmol/L 4.3  3.9  4.0   Chloride 98 - 111 mmol/L 94  93  87   CO2 22 - 32 mmol/L 27  27  24    Calcium  8.9 - 10.3 mg/dL 9.1  9.0  9.2   Total Protein 6.5 - 8.1 g/dL  6.0    Total Bilirubin 0.0 - 1.2 mg/dL  1.2    Alkaline Phos 38 - 126 U/L  108    AST 15 - 41 U/L  48    ALT 0 - 44 U/L  31      No results found.  ASSESSMENT/PLAN:  Assessment: Principal Problem:   Acute blood loss anemia Active Problems:   Anemia in chronic kidney disease   CHF with left ventricular diastolic dysfunction, NYHA class 1 ([name])   Type 2 diabetes mellitus with chronic kidney disease on chronic dialysis, with long-term current use of insulin  ([name])   ESRD on hemodialysis ([name])   Hypertension   Mitral regurgitation moderate to severe based on echocardiogram from 2022   Peripheral vascular disease, unspecified ([name]) critical stenosis lower extremity   Angiodysplasia of small intestine  Anna Carr is a 83 y.o. person living with a history of recurrent GI bleeds with duodenal AVMs, ESRD on HD (TTS), HFpEF, PAD with RLE critical limb ischemia without revascularization option, who presents to the ED for bright red blood per rectum and admitted for acute blood loss anemia  on 7/10.   Plan: Acute Blood Loss Anemia Hematochezia Duodenal AVMs Patient has a history of GI bleed complicated by duodenal AVMs, which has not been treated due to bradycardic arrest during EGD. At discharge on 7/5, Hgb was  9.3 and on this admission was 7.2 Daughter  showed picture of dark stool with bright red blood in the toilet bowl. Patient has received 1u PRBC. Family and patient counseled on risks of procedure. At previous admission family was counseled on the fact that AVMs would likely create slow continuous or recurrent bleed. Risk of procedure is high with this patient due to bradycardic arrest in the past. Family wants to have risks/benefits discussion with GI about procedure. This patient would likely benefit from GOC discussion.  - Post transfusion H&H is 7.9  - Holding DAPT in the setting of GI bleed - GI consulted. Will await their recommendations  - Monitor CBC -Transfuse if Hgb <7 or in setting of acute blood loss  - Page for any acute hemodynamic changes, hematochezia, hematemesis, or large-volume melena   Right Lower Extremity Critical Limb Ischemia PAD Patient had presented to [facility] ED on prior admission for weakness and was found to have right critical limb ischemia. Dr. Lanis with vascular surgery was seeing patient at last hospitalization. Was recommended for patient to start DAPT, which patient tolerated well while in the hospital. Patient is at high risk for complications with revascularization and does not have amenable option for vein graft. Patient finished antibiotic course for suspected cellulitis of right foot, she reports.   - wound care -Called vascular surgery to make them aware of situation   -Holding DAPT due to GI bleed  -tylenol  and oxycodone  5 mg q6h PRN  Hypertension Patient's blood pressure has tendency to run on the hypotensive side.  -Will continue to monitor  - Hold home carvedilol    ESRD on HD TTS Patient had HD yesterday. Will continue HD TTS schedule.  - Monitor CBC and Renal Function Panel - Nephrology consulted and aware of patient  HFpEF Patient has some volume overload with lower extremity edema but no dyspnea. Had HD yesterday, which is the best way to  be able to take volume off of her. She is due for HD on Saturday 7/12.  Echocardiogram on 04/10/2024 showed LVEF of 50-55% with LVH. Bilateral atria dilated. Moderate mitral and triscuspid regurgitation. Calcification of aortic valve. - Resume Lasix  80mg  - Monitor I&Os  Type 2 Diabetes Home medication includes Soliqua  6u each morning. Last A1c 5.8 on 03/15/2024. - Sensitive SSI while on clear liquid diet Best Practice: Diet: Clear liquid diet IVF: Fluids:  VTE: held for bleeding, SCDs c/i with severe PAD Code: Full  Disposition planning: Therapy Recs:  Family Contact: to be notified. DISPO: Anticipated discharge pending clinical improvement  Signature:  Jevaughn Degollado [facility]  2:40 PM, 05/05/2024  On Call pager [phone]

## 2024-05-05 NOTE — Evaluation (Signed)
 Occupational Therapy Evaluation Patient Details Name: Anna Carr MRN: [id number] DOB: 12/25/1940 Today's Date: 05/05/2024   History of Present Illness   Anna Carr is a 83 yo female who presented with bright red blood in stool and increased weakness. Admitted for acute blood loss anemia. PMH positive for recent admissions 5/8-5/19 and 6/23-6/25 and 6/30-7/5 with GIB and symptomatic anemia, Right Lower Extremity Critical Limb Ischemia, ESRD on HD TTS, PAD, HFpEF, DM, HTN, anemia and h/o falls.     Clinical Impressions Anna Carr was evaluated s/p the above admission list. She has had multiple hospital admissions but most recently has been walking in the house with a RW, WC for community and needed set up A for ADLs. Upon evaluation the pt was limited by weakness, RLE pain and decreased activity tolerance. Overall she is mod I for bed mobility but declined progressing OOB due to RLE pain and unwrapped wounds. Due to the deficits listed below the pt also needs up to min A for LB ADLs at the EOB. Pt did share that she has been ambulating to/from the bathroom with RW acutely. Anticipate good progress once R foot is wrapped and pain is managed. Pt will benefit from continued acute OT services and HHOT.      If plan is discharge home, recommend the following:   Assistance with cooking/housework;Assist for transportation;Help with stairs or ramp for entrance     Functional Status Assessment   Patient has had a recent decline in their functional status and demonstrates the ability to make significant improvements in function in a reasonable and predictable amount of time.     Equipment Recommendations   None recommended by OT      Precautions/Restrictions   Precautions Precautions: Fall Restrictions Weight Bearing Restrictions Per Provider Order: No     Mobility Bed Mobility Overal bed mobility: Modified Independent          Transfers       General transfer  comment: pt declined          ADL either performed or assessed with clinical judgement   ADL Overall ADL's : Needs assistance/impaired Eating/Feeding: Independent   Grooming: Set up;Sitting   Upper Body Bathing: Set up;Sitting   Lower Body Bathing: Minimal assistance;Sitting/lateral leans   Upper Body Dressing : Set up;Sitting   Lower Body Dressing: Minimal assistance;Sit to/from stand   Toilet Transfer: Contact guard assist;Ambulation;Rolling walker (2 wheels)   Toileting- Clothing Manipulation and Hygiene: Supervision/safety;Sitting/lateral lean       Functional mobility during ADLs: Contact guard assist;Rolling walker (2 wheels) General ADL Comments: pt declined getting OOB on evaluation due to RLE pain. however she states she has been ambulating to/from bathroom acutely.     Vision Baseline Vision/History: 1 Wears glasses Ability to See in Adequate Light: 2 Moderately impaired Vision Assessment?: Vision impaired- to be further tested in functional context Additional Comments: baseline diabetic retinopathy     Perception Perception: Not tested       Praxis Praxis: Not tested       Pertinent Vitals/Pain Pain Assessment Pain Assessment: Faces Faces Pain Scale: Hurts whole lot Pain Location: RLE with WBing Pain Descriptors / Indicators: Discomfort Pain Intervention(s): Limited activity within patient's tolerance, Monitored during session     Extremity/Trunk Assessment Upper Extremity Assessment Upper Extremity Assessment: Overall WFL for tasks assessed;Generalized weakness   Lower Extremity Assessment Lower Extremity Assessment: Defer to PT evaluation   Cervical / Trunk Assessment Cervical / Trunk Assessment: Kyphotic   Communication Communication Communication:  Impaired   Cognition Arousal: Alert Behavior During Therapy: WFL for tasks assessed/performed Cognition: No apparent impairments             OT - Cognition Comments: good recall of  PLOF and extensive hospital admissions                 Following commands: Intact       Cueing  General Comments   Cueing Techniques: Verbal cues  VSS, dtr in room   Exercises Exercises: Other exercises        Home Living Family/patient expects to be discharged to:: Private residence Living Arrangements: Children Available Help at Discharge: Family;Available 24 hours/day Type of Home: House Home Access: Ramped entrance     Home Layout: One level     Bathroom Shower/Tub: Tub/shower unit;Walk-in shower   Bathroom Toilet: Handicapped height Bathroom Accessibility: No   Home Equipment: Agricultural consultant (2 wheels);Grab bars - tub/shower;Wheelchair - manual;Rollator (4 wheels);BSC/3in1;Cane - single point;Shower seat   Additional Comments: son is her caretaker, daughter on leave from work right now also helping  Lives With: Son;Daughter    Prior Functioning/Environment Prior Level of Function : Needs assist;History of Falls (last six months)             Mobility Comments: RW in the home, intermittent use of WC when weak. Uses WC in community and to go to HD ADLs Comments: Set up A for ADLs    OT Problem List: Decreased strength;Decreased range of motion;Decreased activity tolerance;Impaired balance (sitting and/or standing);Pain   OT Treatment/Interventions: Self-care/ADL training;Therapeutic exercise;Energy conservation;DME and/or AE instruction;Therapeutic activities;Patient/family education;Balance training      OT Goals(Current goals can be found in the care plan section)   Acute Rehab OT Goals Patient Stated Goal: less pain OT Goal Formulation: With patient/family Time For Goal Achievement: 05/19/24 Potential to Achieve Goals: Good ADL Goals Additional ADL Goal #1: pt will complete ADLs with Mod I and LRAD Additional ADL Goal #2: Pt will complete at least 8 minutes of OOB activity to demonstrate improved tolerance for ADLs   OT Frequency:  Min  2X/week       AM-PAC OT 6 Clicks Daily Activity     Outcome Measure Help from another person eating meals?: None Help from another person taking care of personal grooming?: A Little Help from another person toileting, which includes using toliet, bedpan, or urinal?: A Little Help from another person bathing (including washing, rinsing, drying)?: A Little Help from another person to put on and taking off regular upper body clothing?: A Little Help from another person to put on and taking off regular lower body clothing?: A Little 6 Click Score: 19   End of Session Nurse Communication: Mobility status  Activity Tolerance: Patient limited by pain Patient left: in bed;with call bell/phone within reach;with family/visitor present  OT Visit Diagnosis: Muscle weakness (generalized) (M62.81);Pain                Time: 8746-8690 OT Time Calculation (min): 16 min Charges:  OT General Charges $OT Visit: 1 Visit OT Evaluation $OT Eval Moderate Complexity: 1 Mod  Lucie Kendall, OTR/L Acute Rehabilitation Services Office [phone] Secure Chat Communication Preferred   Lucie JONETTA Kendall 05/05/2024, 2:07 PM

## 2024-05-05 NOTE — Consult Note (Cosign Needed Addendum)
 Referring Provider: Dr. Remonia Romano Primary Care Physician:  Thurmond Cathlyn LABOR., MD Primary Gastroenterologist: Dr. Legrand.   Reason for Consultation:  Recurrent hematochezia   HPI: LIAN POUNDS is a 83 y.o. 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 Diff colitis and colon polyps.   She is well-known by our GI practice with numerous hospital admissions with recurrent hematochezia secondary to recurrent GI bleeding likely from small bowel AVMs. Summary of extensive GI evaluation below.  She was recently admitted to the hospital and seen by our inpatient GI service 04/18/2024 with hematochezia and anemia. Hemoglobin 6.9. She was transfused with 2 units of PRBCs, posttransfusion hemoglobin 8.2. CTAP was negative for GI bleed. She recently had a single balloon enteroscopy done in 02/2024 (balloon was never inflated during the procedure) that showed 2 nonbleeding duodenal AVMs that were treated APC. During the procedure the patient developed bradycardia and went into cardiac arrest, requiring CPR. Due to a significant risk of complications from an endoscopic procedure (including the risk of death), no further endoscopic evaluation/procedures were pursued. Patient was advised to have routine blood checks as an outpatient with her PCP with IV iron  and PRBC transfusions as needed.  She presented to the ED 05/04/2024 with recurrent hematochezia.  Admission hemoglobin 7.2 down from 9.3 at time of recent hospital discharge 7/5.  She was transfused 1 unit of PRBCs and posttransfusion hemoglobin level 7.9. Patient was recently placed on DAPT therapy this recent admission 6/30-7/5 due to right critical limb ischemia by vascular surgery. DAPT (Plavix  and ASA) was stopped Monday 7/7 per family. Patient and her family request consideration for  GI procedure, despite being aware of high risk that has been expressed to them by our GI service during her prior hospital admission.  Her daughter stated that her mother became constipated on 7/4 and she received an enema.  Patient started passing bright red blood on 7/7, her daughter stated this was the first time she noticed bright red blood as in the past her mother typically passed dark red or maroon blood.  Stools are chronically black on oral iron .  She passed a few small formed stools earlier this morning which oozed a slight amount of red blood in the toilet as per her RN. She denies having any nausea or vomiting.  No dysphagia or heartburn.  She typically does not have any abdominal pain but is having mild right mid abdominal discomfort at this time.  Last dialysis session was 7/10.  GI PROCEDURES:  Small bowel enteroscopy 03/02/2024: - Normal esophagus.  - A medium amount of food (residue) in the stomach.  - Two non-bleeding angioectasias in the duodenum. Treated with argon plasma coagulation (APC).  - The examined portion of the jejunum was normal.  - No specimens collected.  VCE (blood in the mid small bowel) 02/2024: Evidence of bleeding in her mid small bowel/low-grade again likely secondary to AVMs but beyond the reach of enteroscopy   Small bowel enteroscopy 02/28/2024 by Dr. San: - Normal esophagus.  - A large amount of food (residue) in the stomach.  - Normal examined duodenum.  - The examined portion of the jejunum was normal.  - No specimens collected.  - Able to advance into the proximal jejunum, with limited time spent in the small bowel due to retained gastric contents. No  active bleeding, stigmata of recent bleeding, nor heme noted on this study. Do suspect that she has intermittent small bowel AVM bleeds given her history.  Small bowel enteroscopy 02/26/2024 by Dr. Albertus: - Normal esophagus.  - Normal stomach. -  Blood (coffee-ground and fresh) in the distal  duodenum and proximal jejunum.  - Procedure stopped on advice from anesthesia team due to bradycardia and transient hypotension (responsive to vasopressor). Thus the small bowel was incompletely examined today during withdrawal.  - No small bowel source was identified, but very likely to be angioectasia. - No specimens collected.   Small bowel enteroscopy by Dr. Legrand during hospital admission with GI  bleed 12/18/2021: Normal esophagus. - Normal stomach. - Normal examined duodenum - Normal examined duodenum. 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 Treated with argon plasma coagulation (APC). Clips (MR conditional) were placed. - No specimens collected.   Small bowel capsule endoscopy 12/17/2021: 1) Complete capsule endoscopy with adequate prep 2) There is active bleeding starting in the proximal small bowel at 49 minute mark which is 5 minutes beyond the first duodenal image. There is intermittent blood in lumen extending through to 1 hr and 27 minutes. Unable to visualize a culprit lesion.   Small bowel endoscopy 03/21/2021: - Normal esophagus. - Normal stomach. - A single recently bleeding angioectasia in the duodenum. Treated with argon plasma coagulation (APC). 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 Treated with argon plasma coagulation (APC). - No specimens collected   Colonoscopy 02/16/2021:  - Preparation of the colon was fair. - Two diminutive polyps in the transverse colon and in the cecum, removed with a cold snare. Resected and retrieved. - Diverticulosis in the left colon. - Redundant colon. - The examination was otherwise normal on direct and retroflexion views. A. COLON, CECAL AND TRANSVERSE, POLYPECTOMY:  - Tubular adenoma without high-grade dysplasia or malignancy  - Small serrated polyp, cannot distinguish between a sessile serrated  polyp and hyperplastic polyp    EGD 03/18/2021: Normal larynx. - Normal esophagus. - Normal stomach. - Normal  examined duodenum.   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 difficile colitis 03/30/2021   Cardiac arrest ([name]) 03/02/2024   Cellulitis 03/16/2021   CHF with left ventricular diastolic dysfunction, NYHA class 1 ([name]) 02/02/2017   Cholelithiasis 03/30/2021   Chronic constipation 12/22/2015   Chronic laryngitis 02/17/2017   Last Assessment & Plan:   Formatting of this note might be different from the original.  Evaluation of larynx.  Was hospitalized for pneumonia and during that time she started noticing worsening hoarseness.  Denies any throat pain.  No obvious heartburn.  She did recently start PPI therapy.  She smoked in the past.  Denies any difficulty swallowing.  Hoarseness is improving.  EXAM shows mildly ras   Coagulation defect, unspecified ([name]) 12/07/2019   Colon polyp  Complication of vascular dialysis catheter 03/05/2020   Contact with and (suspected) exposure to covid-19 10/28/2020   COPD mixed type ([name])    Cough, unspecified 10/28/2020   COVID-19 10/29/2020   Depression    Diverticulosis of large intestine without perforation or abscess without bleeding 01/08/2020   Dyspnea on exertion    Dyspnea, unspecified 11/02/2019   Elevated troponin 03/16/2021   Encounter for removal of sutures 12/28/2019   Encounter for  screening for respiratory tuberculosis 12/06/2019   ESRD (end stage renal disease) ([name]) 07/18/2019   ESRD on dialysis ([name]) 09/18/2019   ESRD on hemodialysis ([name]) 01/02/2020   Fever, unspecified 01/23/2020   GI bleed 12/19/2021   GIB (gastrointestinal bleeding) 01/02/2020   Heme positive stool    High risk medication use 12/22/2015   History of blood transfusion    History of colonic polyps 01/08/2020   HLD (hyperlipidemia) 12/22/2015   Hypertension    Hypervolemia 11/13/2019   Hypokalemia 03/11/2019   Hyponatremia 03/11/2019   Hypovolemia 10/27/2021   Hypoxia 03/11/2019   Insomnia    Insulin  long-term use ([name]) 12/22/2015   Iron  deficiency anemia, unspecified 12/06/2019   Major depressive disorder, recurrent ([name]) 12/22/2015   Malaise and fatigue 12/23/2015   Mild protein-calorie malnutrition ([name]) 03/26/2020   Mitral regurgitation moderate to severe based on echocardiogram from 2022 11/03/2021   Musculoskeletal chest pain 03/03/2024   Osteoarthritis    Osteopenia    Other disorders of phosphorus metabolism 01/08/2020   PAC (premature atrial contraction) 10/04/2020   Pain, unspecified 12/06/2019   PAT (paroxysmal atrial tachycardia) ([name]) 10/04/2020   Peripheral vascular disease, unspecified ([name]) critical stenosis lower extremity 12/01/2023   Peripheral arterial disease     Pneumonia    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  Chodri CT annually.     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 dialysis, with long-term current use of insulin  ([name]) 12/23/2015   Type 2 diabetes mellitus, with long-term current use of insulin  ([name]) 03/11/2019    Unspecified hemorrhoids 01/08/2020    Past Surgical History:  Procedure Laterality Date   A/V FISTULAGRAM Left 02/17/2021   Procedure: A/V FISTULAGRAM;  Surgeon: Sheree Penne Bruckner, MD;  Location: [facility] INVASIVE CV LAB;  Service: Cardiovascular;  Laterality: Left;   APPENDECTOMY     AV FISTULA PLACEMENT Left    AV FISTULA PLACEMENT Left 03/12/2021   Procedure: INSERTION OF LEFT UPPER ARM ARTERIOVENOUS (AV) GORE-TEX GRAFT;  Surgeon: Sheree Penne Bruckner, MD;  Location: [facility] OR;  Service: Vascular;  Laterality: Left;   BALLOON ENTEROSCOPY  03/02/2024   Procedure: ENTEROSCOPY, USING BALLOON;  Surgeon: San Sandor GAILS, DO;  Location: [facility] ENDOSCOPY;  Service: Gastroenterology;;   COLONOSCOPY WITH PROPOFOL  N/A 03/18/2021   Procedure: COLONOSCOPY WITH PROPOFOL ;  Surgeon: Legrand Victory LITTIE DOUGLAS, MD;  Location: [facility] ENDOSCOPY;  Service: Gastroenterology;  Laterality: N/A;   CYSTECTOMY     ENTEROSCOPY N/A 03/21/2021   Procedure: ENTEROSCOPY;  Surgeon: Legrand Victory LITTIE DOUGLAS, MD;  Location: [facility] ENDOSCOPY;  Service: Gastroenterology;  Laterality: N/A;   ENTEROSCOPY N/A 12/20/2021   Procedure: ENTEROSCOPY;  Surgeon: Legrand Victory LITTIE DOUGLAS, MD;  Location: [facility] ENDOSCOPY;  Service: Gastroenterology;  Laterality: N/A;   ENTEROSCOPY N/A 02/26/2024   Procedure:  ENTEROSCOPY;  Surgeon: Albertus Gordy HERO, MD;  Location: [facility] ENDOSCOPY;  Service: Gastroenterology;  Laterality: N/A;   ENTEROSCOPY N/A 02/28/2024   Procedure: ENTEROSCOPY;  Surgeon: San Sandor GAILS, DO;  Location: [facility] ENDOSCOPY;  Service: Gastroenterology;  Laterality: N/A;   ESOPHAGOGASTRODUODENOSCOPY (EGD) WITH PROPOFOL  N/A 03/18/2021   Procedure: ESOPHAGOGASTRODUODENOSCOPY (EGD) WITH PROPOFOL ;  Surgeon: Legrand Victory LITTIE DOUGLAS, MD;  Location: [facility] ENDOSCOPY;  Service: Gastroenterology;  Laterality: N/A;   GIVENS CAPSULE STUDY N/A 03/19/2021   Procedure: GIVENS CAPSULE STUDY;  Surgeon: Legrand Victory LITTIE DOUGLAS, MD;  Location: [facility] ENDOSCOPY;  Service: Gastroenterology;   Laterality: N/A;   GIVENS CAPSULE STUDY N/A 02/29/2024   Procedure: IMAGING PROCEDURE, GI TRACT, INTRALUMINAL, VIA CAPSULE;  Surgeon: San Sandor GAILS, DO;  Location: [facility] ENDOSCOPY;  Service: Gastroenterology;  Laterality: N/A;   HEMOSTASIS CLIP PLACEMENT  12/20/2021   Procedure: HEMOSTASIS CLIP PLACEMENT;  Surgeon: Legrand Victory LITTIE DOUGLAS, MD;  Location: [facility] ENDOSCOPY;  Service: Gastroenterology;;   HOT HEMOSTASIS N/A 03/21/2021   Procedure: HOT HEMOSTASIS (ARGON PLASMA COAGULATION/BICAP);  Surgeon: Legrand Victory LITTIE DOUGLAS, MD;  Location: [facility] ENDOSCOPY;  Service: Gastroenterology;  Laterality: N/A;   HOT HEMOSTASIS N/A 12/20/2021   Procedure: HOT HEMOSTASIS (ARGON PLASMA COAGULATION/BICAP);  Surgeon: Legrand Victory LITTIE DOUGLAS, MD;  Location: [facility] ENDOSCOPY;  Service: Gastroenterology;  Laterality: N/A;   HOT HEMOSTASIS N/A 03/02/2024   Procedure: EGD, WITH ARGON PLASMA COAGULATION;  Surgeon: San Sandor GAILS, DO;  Location: [facility] ENDOSCOPY;  Service: Gastroenterology;  Laterality: N/A;   HYSTEROSCOPY     IR THROMBECTOMY AV FISTULA W/THROMBOLYSIS/PTA INC/SHUNT/IMG LEFT Left 04/12/2024   IR US  GUIDE VASC ACCESS LEFT  04/12/2024   LOWER EXTREMITY ANGIOGRAPHY N/A 02/03/2024   Procedure: Lower Extremity Angiography;  Surgeon: Court Dorn PARAS, MD;  Location: [facility] INVASIVE CV LAB;  Service: Cardiovascular;  Laterality: N/A;   POLYPECTOMY  03/18/2021   Procedure: POLYPECTOMY;  Surgeon: Legrand Victory LITTIE DOUGLAS, MD;  Location: [facility] ENDOSCOPY;  Service: Gastroenterology;;    Prior to Admission medications   Medication Sig Start Date End Date Taking? Authorizing Provider  acetaminophen  (TYLENOL ) 500 MG tablet Take 500 mg by mouth every 4 (four) hours as needed for mild pain (pain score 1-3) or moderate pain (pain score 4-6).   Yes [provider]  ascorbic acid  (VITAMIN C ) 1000 MG tablet Take 1,000 mg by mouth at bedtime. 12/07/19  Yes [provider]  Biotin  5000 MCG TABS Take 5,000 mcg by mouth daily in the afternoon.    Yes [provider]  camphor-menthol  (SARNA) lotion Apply 1 Application topically as needed for itching. 04/29/24  Yes D'Mello, Rosalyn, DO  carboxymethylcellulose (REFRESH PLUS) 0.5 % SOLN Place 1 drop into both eyes 3 (three) times daily as needed (dry eyes).   Yes [provider]  cyanocobalamin  (VITAMIN B12) 1000 MCG tablet Take 1,000 mcg by mouth every evening.   Yes [provider]  ferric citrate  (AURYXIA ) 1 GM 210 MG(Fe) tablet Take 210 mg by mouth 3 (three) times daily with meals.   Yes [provider]  furosemide  (LASIX ) 80 MG tablet Take 80 mg by mouth daily in the afternoon. 04/03/21  Yes [provider]  hydrocortisone  1 % ointment Apply 1 Application topically 2 (two) times daily for 7 days. 04/29/24 05/06/24 Yes D'Mello, Rosalyn, DO  Insulin  Glargine-Lixisenatide  (SOLIQUA ) 100-33 UNT-MCG/ML SOPN Inject 6 Units into the skin in the morning. 03/11/24  Yes Zheng, Michael, DO  Lactobacillus (PROBIOTIC ACIDOPHILUS PO) Take 1 capsule by mouth in the  morning.   Yes [provider]  lidocaine -prilocaine  (EMLA ) cream Apply 1 application topically as needed (prior to fisula access). Tuesday, Thursday, Saturday for dialysis 07/04/20  Yes [provider]  midodrine  (PROAMATINE ) 10 MG tablet Take 10 mg by mouth 3 (three) times daily. 05/04/24  Yes [provider]  mupirocin  ointment (BACTROBAN ) 2 % Apply 1 Application topically 2 (two) times daily. 04/21/24 05/05/24 Yes [provider]  Omega-3 Fatty Acids (FISH OIL PO) Take 1 capsule by mouth in the morning and at bedtime.   Yes [provider]  ondansetron  (ZOFRAN -ODT) 4 MG disintegrating tablet Take 4 mg by mouth every 8 (eight) hours as needed. 05/03/24  Yes [provider]  pantoprazole  (PROTONIX ) 40 MG tablet Take 1 tablet (40 mg total) by mouth 2 (two) times daily. 03/11/24  Yes Zheng, Michael, DO  polyethylene glycol (MIRALAX  / GLYCOLAX ) 17 g packet Take 17 g  by mouth 2 (two) times daily as needed for moderate constipation.   Yes [provider]  rosuvastatin  (CRESTOR ) 10 MG tablet Take 1 tablet (10 mg total) by mouth daily. 04/29/24  Yes D'Mello, Rosalyn, DO  senna (SENOKOT) 8.6 MG TABS tablet Take 1 tablet (8.6 mg total) by mouth daily as needed for mild constipation. 04/14/24  Yes Renne Homans, MD  Vitamin E  670 MG (1000 UT) CAPS Take 1,000 Units by mouth in the morning. 01/09/20  Yes [provider]  Zinc  50 MG TABS Take 50 mg by mouth daily.   Yes [provider]    Current Facility-Administered Medications  Medication Dose Route Frequency Provider Last Rate Last Admin   acetaminophen  (TYLENOL ) tablet 1,000 mg  1,000 mg Oral Q8H PRN Jolaine Pac, DO   1,000 mg at 05/05/24 0429   albumin  human 25 % solution 25 g  25 g Intravenous Q1H PRN Geralynn Charleston, MD       artificial tears ophthalmic solution 1 drop  1 drop Both Eyes TID PRN Jolaine Pac, DO       ascorbic acid  (VITAMIN C ) tablet 1,000 mg  1,000 mg Oral QHS Jolaine Pac, DO   1,000 mg at 05/04/24 2248   camphor-menthol  (SARNA) lotion 1 Application  1 Application Topical PRN Jolaine Pac, DO       [START ON 05/06/2024] Chlorhexidine  Gluconate Cloth 2 % PADS 6 each  6 each Topical Q0600 Geralynn Charleston, MD       clotrimazole  (LOTRIMIN ) 1 % cream   Topical BID Horton, Kristie M, DO   Given at 05/05/24 0947   cyanocobalamin  (VITAMIN B12) tablet 1,000 mcg  1,000 mcg Oral QPM Jolaine Pac, DO       Darbepoetin Alfa  (ARANESP ) injection 200 mcg  200 mcg Subcutaneous Q Fri-1800 Schertz, Robert, MD       ferric citrate  (AURYXIA ) tablet 210 mg  210 mg Oral TID WC Goodwin, Joseph, DO   210 mg at 05/05/24 1213   fluticasone  (FLONASE ) 50 MCG/ACT nasal spray 1 spray  1 spray Each Nare Daily PRN Jolaine Pac, DO       insulin  aspart (novoLOG ) injection 0-9 Units  0-9 Units Subcutaneous TID WC Jolaine Pac, DO   1 Units at 05/05/24 1213   leptospermum manuka  honey (MEDIHONEY) paste 1 Application  1 Application Topical Daily [name] Fellows, MD   1 Application at 05/05/24 1332   Muscle Rub CREA 1 Application  1 Application Topical BID PRN Jolaine Pac, DO       oxyCODONE  (Oxy IR/ROXICODONE ) immediate release tablet  5 mg  5 mg Oral Q6H PRN Amilibia, Jaden, DO   5 mg at 05/05/24 0015   pantoprazole  (PROTONIX ) injection 40 mg  40 mg Intravenous Q12H Jolaine Pac, DO   40 mg at 05/05/24 0946   polyethylene glycol (MIRALAX  / GLYCOLAX ) packet 17 g  17 g Oral BID PRN Jolaine Pac, DO       rosuvastatin  (CRESTOR ) tablet 10 mg  10 mg Oral Daily Jolaine Pac, DO   10 mg at 05/05/24 0946   senna (SENOKOT) tablet 8.6 mg  1 tablet Oral Daily PRN Jolaine Pac, DO       Vitamin E  CAPS 800 Units  800 Units Oral q AM Jolaine Pac, DO        Allergies as of 05/04/2024 - Review Complete 05/04/2024  Allergen Reaction Noted   Atorvastatin  Other (See Comments) 04/10/2024   Wound dressing adhesive Rash 04/24/2024    Family History  Problem Relation Age of Onset   Diabetes Mother    Hypertension Mother    Diabetes Maternal Grandfather    Liver disease Other     Social History   Socioeconomic History   Marital status: Widowed    Spouse name: Not on file   Number of children: 3   Years of education: Not on file   Highest education level: Not on file  Occupational History   Not on file  Tobacco Use   Smoking status: Former    Current packs/day: 0.00    Types: Cigarettes    Quit date: 51    Years since quitting: 42.5   Smokeless tobacco: Never   Tobacco comments:    quit in 1973  Vaping Use   Vaping status: Never Used  Substance and Sexual Activity   Alcohol  use: Not Currently   Drug use: Never   Sexual activity: Not Currently  Other Topics Concern   Not on file  Social History Narrative   Not on file   Social Drivers of Health   Financial Resource Strain: Low Risk  (10/27/2021)   Received from [facility]   Overall Financial  Resource Strain (CARDIA)    Difficulty of Paying Living Expenses: Not hard at all  Food Insecurity: No Food Insecurity (04/24/2024)   Hunger Vital Sign    Worried About Running Out of Food in the Last Year: Never true    Ran Out of Food in the Last Year: Never true  Transportation Needs: No Transportation Needs (04/24/2024)   PRAPARE - Administrator, Civil Service (Medical): No    Lack of Transportation (Non-Medical): No  Physical Activity: Not on file  Stress: No Stress Concern Present (10/27/2021)   Received from [facility] - Occupational Stress Questionnaire    Feeling of Stress : Not at all  Social Connections: Unknown (04/24/2024)   Social Connection and Isolation Panel    Frequency of Communication with Friends and Family: Twice a week    Frequency of Social Gatherings with Friends and Family: Twice a week    Attends Religious Services: Never    Database administrator or Organizations: No    Attends Banker Meetings: Never    Marital Status: Not on file  Recent Concern: Social Connections - Socially Isolated (04/18/2024)   Social Connection and Isolation Panel    Frequency of Communication with Friends and Family: Twice a week    Frequency of Social Gatherings with Friends and Family: Three times a  week    Attends Religious Services: Never    Active Member of Clubs or Organizations: No    Attends Banker Meetings: Never    Marital Status: Widowed  Intimate Partner Violence: Not At Risk (04/24/2024)   Humiliation, Afraid, Rape, and Kick questionnaire    Fear of Current or Ex-Partner: No    Emotionally Abused: No    Physically Abused: No    Sexually Abused: No    Review of Systems: See HPI, all other systems reviewed and were negative  Physical Exam: Vital signs in last 24 hours: Temp:  [97.3 F (36.3 C)-99 F (37.2 C)] 97.3 F (36.3 C) (07/11 0934) Pulse Rate:  [25-99] 93 (07/11  0934) Resp:  [16-25] 18 (07/11 0934) BP: (96-117)/(44-72) 98/53 (07/11 0934) SpO2:  [57 %-100 %] 92 % (07/11 0934) Weight:  [69.4 kg] 69.4 kg (07/10 1744) Last BM Date : 05/05/24  General: Chronically ill appearing 83 year old female in no acute distress.  Sitting up in the chair. Head:  Normocephalic and atraumatic. Eyes:  No scleral icterus. Conjunctiva pink. Ears:  Normal auditory acuity. Nose:  No deformity, discharge or lesions. Mouth: Absent dentition.  No ulcers or lesions.  Neck:  Supple. No lymphadenopathy or thyromegaly.  Lungs: Breath sounds clear throughout. No wheezes, rhonchi or crackles.  Heart: Irregular rhythm, soft systolic murmur. Abdomen: Soft, mild distention.  Nontender.  Positive bowel sounds all 4 quadrants. Rectal: External hemorrhoids noninflamed without active bleeding.  Soft black stool in the rectal vault.  Internal hemorrhoids without prolapse.  No palpable mass within reach.  RN and daughter at the bedside at time of exam. Musculoskeletal:  Symmetrical without gross deformities.  Pulses:  Normal pulses noted. Extremities: RLE vascular disease/deformity.  LUE fistula with positive bruit and thrill. Neurologic:  Alert and oriented x 4. No focal deficits.  Skin:  Intact without significant lesions or rashes. Psych:  Alert and cooperative. Normal mood and affect.  Intake/Output from previous day: No intake/output data recorded. Intake/Output this shift: No intake/output data recorded.  Lab Results: Recent Labs    05/04/24 1750 05/05/24 0435  WBC 4.9 5.6  HGB 7.2* 7.9*  HCT 23.0* 25.1*  PLT 202 182   BMET Recent Labs    05/04/24 1750 05/05/24 0435  NA 135 136  K 3.9 4.3  CL 93* 94*  CO2 27 27  GLUCOSE 163* 120*  BUN 27* 34*  CREATININE 3.14* 3.80*  CALCIUM  9.0 9.1   LFT Recent Labs    05/04/24 1750 05/05/24 0435  PROT 6.0*  --   ALBUMIN  3.0* 2.7*  AST 48*  --   ALT 31  --   ALKPHOS 108  --   BILITOT 1.2  --    PT/INR No  results for input(s): LABPROT, INR in the last 72 hours. Hepatitis Panel No results for input(s): HEPBSAG, HCVAB, HEPAIGM, HEPBIGM in the last 72 hours.  Studies/Results: No results found.  IMPRESSION/PLAN:  83 year old female with admitted with recurrent acute on chronic anemia with hematochezia. History of small bowel AVMs, likely source of chronic GI bleed and anemia. Patient had bradycardic arrest immediately after endoscopic procedure 02/2024 which required CPR, and also had hemodynamically significant bradycardia with EGD done earlier 02/2024.  Hemodynamically stable. -Endoscopic evaluation deferred as the patient is too high risk as noted above -Recommend establishing care with a hematologist in [place] to monitor her blood counts on a regular basis and to transfuse IV iron  and PRBCs as needed -MiraLAX  nightly to avoid straining,  possible component of hemorrhoidal bleeding as patient had recent bright red blood per the rectum and with past admissions endorsed having dark red/maroon hematochezia. -Desitin bid for hemorrhoids  -CBC/BMP in a.m. -Await further recommendations per Dr. Legrand  Peripheral arterial disease, RLE with critical limb ischemia.  Last dose of Plavix  and ASA was on 7/7. - Recommend consulting with vascular surgery, difficult situation as she will continue to actively GI bleed if DAPT restarted  ESRD on HD  DM type II     Elida CHRISTELLA Shawl  05/05/2024, 5:23 PM  I have taken an interval history, thoroughly reviewed the chart and examined the patient. I agree with the Advanced Practitioner's note, impression and recommendations, and have recorded additional findings, impressions and recommendations below. I performed a substantive portion of this encounter (>50% time spent), including a complete performance of the medical decision making.  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 At this time had some self-limited bright red blood per rectum (small-volume from 1 her daughter showing me a photograph of), probably precipitated by constipation based on her report.  Vascular surgery is seeing this patient, I read their consult note, they seem pleased with improvement in  lower extremity ulcers after recent antibiotics and diuresis and seem to have agreed with the discontinuation of Plavix .  I spent some time talking with her daughter at the bedside today.  Sharla is in very difficult situation with her more complex medical issues. She is unquestionably not a candidate for further endoscopic procedures because of her respiratory and cardiac arrest during the May enteroscopy with Dr. San. If she is on antiplatelet agents, this will likely cause more significant chronic GI blood loss from her AVMs, but I understand those medicines also help her peripheral circulation.    I agree entirely with obtaining outpatient hematologic clinic evaluation for close monitoring of her hemoglobin, periodic use of IV iron  (which can then allow discontinuation of oral iron  which seems to cause digestive upset and constipation as well as on this occasion some rectal bleeding), and additional consideration of using LAR octreotide to hopefully decrease the small bowel AVM blood loss.  Beyond that, I am afraid we cannot be of much more help to Ms. Mccart.  Call as needed arises.  Victory LITTIE Legrand III [phone]

## 2024-05-05 NOTE — Consult Note (Signed)
 Renal Service Consult Note [facility]  Anna Carr 05/05/2024 Anna JONETTA Fret, MD Requesting Physician: Dr. [name]  Reason for Consult: ESRD pt w/ hematochezia HPI: The patient is a 83 y.o. year-old w/ PMH as below who presented to ED for hematochezia.  She had some black tarry stools and also bright red blood in the toilet bowl.   History of duodenal AVMs with recent upper GI bleed.  Also has right lower leg critical limb ischemia. In the ED vital signs were stable, Hgb 7.2, FOBT positive, EKG sinus arrhythmia.  Patient received 1 unit PRBCs.  Patient was admitted.  We are asked to see for dialysis.  Pt seen in hospital room.  Patient is in no distress.  No acute complaints other than as above.  No recent dialysis issues.   ROS - denies CP, no joint pain, no HA, no blurry vision, no rash, no diarrhea, no nausea/ vomiting   Past Medical History  Past Medical History:  Diagnosis Date   Acute on chronic anemia 02/25/2024   Acute pain of left shoulder 03/05/2024   Acute upper GI bleed 03/03/2024   Anemia due to chronic blood loss    Anemia in chronic kidney disease 01/23/2017   Anemia, chronic disease 01/23/2017   Angiodysplasia of small intestine 02/25/2024   Aortic stenosis moderate 2024 06/21/2023   Arteriovenous fistula, acquired ([name]) 11/02/2019   AV fistula ([name])    AVM (arteriovenous malformation)    AVM (arteriovenous malformation) of duodenum, acquired 03/03/2024   AVM (arteriovenous malformation) of duodenum, acquired with hemorrhage 10/30/2021   AVM (arteriovenous malformation) of small bowel, acquired with hemorrhage    C. difficile colitis 03/30/2021   Cardiac arrest ([name]) 03/02/2024   Cellulitis 03/16/2021   CHF with left ventricular diastolic dysfunction, NYHA class 1 ([name]) 02/02/2017   Cholelithiasis 03/30/2021   Chronic constipation 12/22/2015   Chronic laryngitis 02/17/2017   Last Assessment & Plan:   Formatting of this note might be  different from the original.  Evaluation of larynx.  Was hospitalized for pneumonia and during that time she started noticing worsening hoarseness.  Denies any throat pain.  No obvious heartburn.  She did recently start PPI therapy.  She smoked in the past.  Denies any difficulty swallowing.  Hoarseness is improving.  EXAM shows mildly ras   Coagulation defect, unspecified ([name]) 12/07/2019   Colon polyp    Complication of vascular dialysis catheter 03/05/2020   Contact with and (suspected) exposure to covid-19 10/28/2020   COPD mixed type ([name])    Cough, unspecified 10/28/2020   COVID-19 10/29/2020   Depression    Diverticulosis of large intestine without perforation or abscess without bleeding 01/08/2020   Dyspnea on exertion    Dyspnea, unspecified 11/02/2019   Elevated troponin 03/16/2021   Encounter for removal of sutures 12/28/2019   Encounter for screening for respiratory tuberculosis 12/06/2019   ESRD (end stage renal disease) ([name]) 07/18/2019   ESRD on dialysis ([name]) 09/18/2019   ESRD on hemodialysis ([name]) 01/02/2020   Fever, unspecified 01/23/2020   GI bleed 12/19/2021   GIB (gastrointestinal bleeding) 01/02/2020   Heme positive stool    High risk medication use 12/22/2015   History of blood transfusion    History of colonic polyps 01/08/2020   HLD (hyperlipidemia) 12/22/2015   Hypertension    Hypervolemia 11/13/2019   Hypokalemia 03/11/2019   Hyponatremia 03/11/2019   Hypovolemia 10/27/2021   Hypoxia 03/11/2019   Insomnia    Insulin  long-term use ([name]) 12/22/2015  Iron  deficiency anemia, unspecified 12/06/2019   Major depressive disorder, recurrent ([name]) 12/22/2015   Malaise and fatigue 12/23/2015   Mild protein-calorie malnutrition ([name]) 03/26/2020   Mitral regurgitation moderate to severe based on echocardiogram from 2022 11/03/2021   Musculoskeletal chest pain 03/03/2024   Osteoarthritis    Osteopenia    Other disorders of phosphorus metabolism 01/08/2020    PAC (premature atrial contraction) 10/04/2020   Pain, unspecified 12/06/2019   PAT (paroxysmal atrial tachycardia) ([name]) 10/04/2020   Peripheral vascular disease, unspecified ([name]) critical stenosis lower extremity 12/01/2023   Peripheral arterial disease     Pneumonia    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  Chodri CT annually.     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 dialysis, with long-term current use of insulin  ([name]) 12/23/2015   Type 2 diabetes mellitus, with long-term current use of insulin  ([name]) 03/11/2019   Unspecified hemorrhoids 01/08/2020   Past Surgical History  Past Surgical History:  Procedure Laterality Date   A/V FISTULAGRAM Left 02/17/2021   Procedure: A/V FISTULAGRAM;  Surgeon: Sheree Penne Bruckner, MD;  Location: [facility] INVASIVE CV LAB;  Service: Cardiovascular;  Laterality: Left;   APPENDECTOMY     AV FISTULA PLACEMENT Left    AV FISTULA PLACEMENT Left 03/12/2021   Procedure: INSERTION OF LEFT UPPER ARM ARTERIOVENOUS (AV) GORE-TEX GRAFT;  Surgeon: Sheree Penne Bruckner, MD;  Location: [facility] OR;  Service: Vascular;  Laterality: Left;   BALLOON ENTEROSCOPY  03/02/2024   Procedure: ENTEROSCOPY, USING BALLOON;  Surgeon: San Sandor GAILS, DO;  Location: [facility] ENDOSCOPY;  Service: Gastroenterology;;   COLONOSCOPY WITH PROPOFOL  N/A 03/18/2021   Procedure: COLONOSCOPY WITH PROPOFOL ;  Surgeon: Legrand Victory LITTIE DOUGLAS, MD;  Location: [facility] ENDOSCOPY;  Service: Gastroenterology;  Laterality: N/A;   CYSTECTOMY     ENTEROSCOPY N/A 03/21/2021   Procedure: ENTEROSCOPY;  Surgeon: Legrand Victory LITTIE DOUGLAS, MD;  Location: [facility] ENDOSCOPY;  Service: Gastroenterology;   Laterality: N/A;   ENTEROSCOPY N/A 12/20/2021   Procedure: ENTEROSCOPY;  Surgeon: Legrand Victory LITTIE DOUGLAS, MD;  Location: [facility] ENDOSCOPY;  Service: Gastroenterology;  Laterality: N/A;   ENTEROSCOPY N/A 02/26/2024   Procedure: ENTEROSCOPY;  Surgeon: Albertus Gordy HERO, MD;  Location: [facility] ENDOSCOPY;  Service: Gastroenterology;  Laterality: N/A;   ENTEROSCOPY N/A 02/28/2024   Procedure: ENTEROSCOPY;  Surgeon: San Sandor GAILS, DO;  Location: [facility] ENDOSCOPY;  Service: Gastroenterology;  Laterality: N/A;   ESOPHAGOGASTRODUODENOSCOPY (EGD) WITH PROPOFOL  N/A 03/18/2021   Procedure: ESOPHAGOGASTRODUODENOSCOPY (EGD) WITH PROPOFOL ;  Surgeon: Legrand Victory LITTIE DOUGLAS, MD;  Location: [facility] ENDOSCOPY;  Service: Gastroenterology;  Laterality: N/A;   GIVENS CAPSULE STUDY N/A 03/19/2021   Procedure: GIVENS CAPSULE STUDY;  Surgeon: Legrand Victory LITTIE DOUGLAS, MD;  Location: [facility] ENDOSCOPY;  Service: Gastroenterology;  Laterality: N/A;   GIVENS CAPSULE STUDY N/A 02/29/2024   Procedure: IMAGING PROCEDURE, GI TRACT, INTRALUMINAL, VIA CAPSULE;  Surgeon: San Sandor GAILS, DO;  Location: [facility] ENDOSCOPY;  Service: Gastroenterology;  Laterality: N/A;   HEMOSTASIS CLIP PLACEMENT  12/20/2021   Procedure: HEMOSTASIS CLIP PLACEMENT;  Surgeon: Legrand Victory LITTIE DOUGLAS, MD;  Location: [facility] ENDOSCOPY;  Service: Gastroenterology;;   HOT HEMOSTASIS N/A 03/21/2021   Procedure: HOT HEMOSTASIS (ARGON PLASMA COAGULATION/BICAP);  Surgeon: Legrand Victory LITTIE DOUGLAS, MD;  Location:  [facility] ENDOSCOPY;  Service: Gastroenterology;  Laterality: N/A;   HOT HEMOSTASIS N/A 12/20/2021   Procedure: HOT HEMOSTASIS (ARGON PLASMA COAGULATION/BICAP);  Surgeon: Legrand Victory LITTIE DOUGLAS, MD;  Location: [facility] ENDOSCOPY;  Service: Gastroenterology;  Laterality: N/A;   HOT HEMOSTASIS N/A 03/02/2024   Procedure: EGD, WITH ARGON PLASMA COAGULATION;  Surgeon: San Sandor GAILS, DO;  Location: [facility] ENDOSCOPY;  Service: Gastroenterology;  Laterality: N/A;   HYSTEROSCOPY     IR THROMBECTOMY AV FISTULA W/THROMBOLYSIS/PTA  INC/SHUNT/IMG LEFT Left 04/12/2024   IR US  GUIDE VASC ACCESS LEFT  04/12/2024   LOWER EXTREMITY ANGIOGRAPHY N/A 02/03/2024   Procedure: Lower Extremity Angiography;  Surgeon: Court Dorn PARAS, MD;  Location: [facility] INVASIVE CV LAB;  Service: Cardiovascular;  Laterality: N/A;   POLYPECTOMY  03/18/2021   Procedure: POLYPECTOMY;  Surgeon: Legrand Victory LITTIE DOUGLAS, MD;  Location: [facility] ENDOSCOPY;  Service: Gastroenterology;;   Family History  Family History  Problem Relation Age of Onset   Diabetes Mother    Hypertension Mother    Diabetes Maternal Grandfather    Liver disease Other    Social History  reports that she quit smoking about 42 years ago. Her smoking use included cigarettes. She has never used smokeless tobacco. She reports that she does not currently use alcohol . She reports that she does not use drugs. Allergies  Allergies  Allergen Reactions   Atorvastatin  Other (See Comments)    Constipation   Wound Dressing Adhesive Rash   Home medications Prior to Admission medications   Medication Sig Start Date End Date Taking? Authorizing Provider  acetaminophen  (TYLENOL ) 500 MG tablet Take 500 mg by mouth every 4 (four) hours as needed for mild pain (pain score 1-3) or moderate pain (pain score 4-6).   Yes [provider]  ascorbic acid  (VITAMIN C ) 1000 MG tablet Take 1,000 mg by mouth at bedtime. 12/07/19  Yes [provider]  Biotin  5000 MCG TABS Take 5,000 mcg by mouth daily in the afternoon.   Yes [provider]  camphor-menthol  VIKKI) lotion Apply 1 Application topically as needed for itching. 04/29/24  Yes D'Mello, Rosalyn, DO  carboxymethylcellulose (REFRESH PLUS) 0.5 % SOLN Place 1 drop into both eyes 3 (three) times daily as needed (dry eyes).   Yes [provider]  cyanocobalamin  (VITAMIN B12) 1000 MCG tablet Take 1,000 mcg by mouth every evening.   Yes [provider]  ferric citrate  (AURYXIA ) 1 GM 210 MG(Fe) tablet Take 210 mg by mouth 3  (three) times daily with meals.   Yes [provider]  furosemide  (LASIX ) 80 MG tablet Take 80 mg by mouth daily in the afternoon. 04/03/21  Yes [provider]  hydrocortisone  1 % ointment Apply 1 Application topically 2 (two) times daily for 7 days. 04/29/24 05/06/24 Yes D'Mello, Rosalyn, DO  Insulin  Glargine-Lixisenatide  (SOLIQUA ) 100-33 UNT-MCG/ML SOPN Inject 6 Units into the skin in the morning. 03/11/24  Yes Zheng, Michael, DO  Lactobacillus (PROBIOTIC ACIDOPHILUS PO) Take 1 capsule by mouth in the morning.   Yes [provider]  lidocaine -prilocaine  (EMLA ) cream Apply 1 application topically as needed (prior to fisula access). Tuesday, Thursday, Saturday for dialysis 07/04/20  Yes [provider]  midodrine  (PROAMATINE ) 10 MG tablet Take 10 mg by mouth 3 (three) times daily. 05/04/24  Yes [provider]  mupirocin  ointment (BACTROBAN ) 2 % Apply 1 Application topically 2 (two) times daily. 04/21/24 05/05/24 Yes [provider]  Omega-3 Fatty Acids (FISH OIL PO) Take 1 capsule by mouth in the  morning and at bedtime.   Yes [provider]  ondansetron  (ZOFRAN -ODT) 4 MG disintegrating tablet Take 4 mg by mouth every 8 (eight) hours as needed. 05/03/24  Yes [provider]  pantoprazole  (PROTONIX ) 40 MG tablet Take 1 tablet (40 mg total) by mouth 2 (two) times daily. 03/11/24  Yes Zheng, Michael, DO  polyethylene glycol (MIRALAX  / GLYCOLAX ) 17 g packet Take 17 g by mouth 2 (two) times daily as needed for moderate constipation.   Yes [provider]  rosuvastatin  (CRESTOR ) 10 MG tablet Take 1 tablet (10 mg total) by mouth daily. 04/29/24  Yes D'Mello, Rosalyn, DO  senna (SENOKOT) 8.6 MG TABS tablet Take 1 tablet (8.6 mg total) by mouth daily as needed for mild constipation. 04/14/24  Yes Renne Homans, MD  Vitamin E  670 MG (1000 UT) CAPS Take 1,000 Units by mouth in the morning. 01/09/20  Yes [provider]  Zinc  50 MG TABS  Take 50 mg by mouth daily.   Yes [provider]     Vitals:   05/05/24 0700 05/05/24 0730 05/05/24 0830 05/05/24 0934  BP: 96/60 111/63 102/65 (!) 98/53  Pulse: 94 88 82 93  Resp: 20 18 17 18   Temp:    (!) 97.3 F (36.3 C)  TempSrc:    Oral  SpO2:  100% 100% 92%  Weight:      Height:       Exam Gen alert, no distress, elderly female somewhat frail No rash, cyanosis or gangrene Sclera anicteric, throat clear  No jvd or bruits Chest clear bilat to bases, no rales/ wheezing RRR no MRG Abd soft ntnd no mass or ascites +bs GU deferred MS no joint effusions or deformity Ext 2+ L pretibial edema, 1+ right PT edema w/ mild erythema, no hip edema or other edema Neuro is alert, Ox 3 , nf    LUE AVF + bruit  Relevant home meds: Lasix  80mg  every day Midodrine  10 tid    OP HD: TTS [place] 3.5h   B400   70kg  2K bath   LUA AVG  Heparin  none Last OP HD 7/10, post wt 71.1kg Usually comes off 1-2 kg over Good compliance Mircera 225 q 2 , last 6/03    Assessment/ Plan: Anemia of ABL: Hb 7.2 yest and 7.9 today, sp prbc's. Per pmd.  GI bleed: per pmd / GI ESRD: on HD TTS. Next HD Sat.  BP: chronic hypotension w/ BP's 90s- 100s, on midodrine  10 tid at home. Cont here.  Volume: +LE edema, no resp issus, on room air and lungs clear. Pull 3 L w/ next HD as tol.  Anemia of esrd: last OP HD 7.5 on 7/8, was 8.6 on 6/26. Last mircera 225 mcg was 6/03 so was due on 6.17. Only esa given here was 40 mcg on 6/20. Today is 7/11. Needs esa, will start darbe 200 mcg sq weekly while here. Last tsat 26% on 7/3, ferritin 1188.        Myer Fret  MD [facility] 05/05/2024, 3:02 PM  Recent Labs  Lab 04/29/24 0525 05/04/24 1750 05/05/24 0435  HGB 9.3* 7.2* 7.9*  ALBUMIN  3.0* 3.0* 2.7*  CALCIUM  9.2 9.0 9.1  PHOS 5.4*  --  5.1*  CREATININE 5.38* 3.14* 3.80*  K 4.0 3.9 4.3   Inpatient medications:  ascorbic acid   1,000 mg Oral QHS   clotrimazole    Topical BID   cyanocobalamin   1,000  mcg Oral QPM   ferric citrate   210  mg Oral TID WC   insulin  aspart  0-9 Units Subcutaneous TID WC   leptospermum manuka honey  1 Application Topical Daily   pantoprazole  (PROTONIX ) IV  40 mg Intravenous Q12H   rosuvastatin   10 mg Oral Daily   Vitamin E   800 Units Oral q AM    acetaminophen , artificial tears, camphor-menthol , fluticasone , Muscle Rub, oxyCODONE , polyethylene glycol, senna

## 2024-05-05 NOTE — Consult Note (Addendum)
 WOC Nurse Consult Note: Reason for Consult: requested to assess a RLE wounds and R heel. Wound type: critical ischemia on the right lower extremity. Previously revascularization. Caracteristics of arterial wounds. Erythema on the top of her foot, same kind of wound on lateral R foot. Pressure Injury POA: NA Measurement: multiple wounds with 0.3 x 0.3 x 0.1 cm,  Wound bed: 100% yellow slough. Drainage (amount, consistency, odor)  Periwound: edema 3+/4+, non palpable pulses, cold foot comparing to the other. Red toes extremities, Onychomycosis on the L toe. Dressing procedure/placement/frequency: Cleanse with vashe [id number], not rinse. Apply Medihoney daily to the wounds bed (RLE and Heel). Cover with foam dressing. Change every 3 days or if is saturated.  Note: The patient's extremities are very painful to the touch. I explained to the family the importance of covering the wounds. They thought it best to keep the air open. I explained that the wound needs more moisture to debride the yellow tissue and allow the body to develop healthy tissue (red granulated tissue).  Keep the leg above the heart level to relieve the edema. The pt cannot be wrapped due the PAD (peripheral artery disease). If need to hold the foam on the heel. Wrap with Kerlix (gauze bandage) with no pressure, since the base of the toes, wrapping the heel.  WOC team will not plan to follow further.  Please reconsult if further assistance is needed. Thank-you,  Lela Holm BSN, RN, [employer], WOC  (Pager: [phone])

## 2024-05-05 NOTE — Evaluation (Signed)
 Physical Therapy Evaluation Patient Details Name: Anna Carr MRN: [id number] DOB: December 17, 1940 Today's Date: 05/05/2024  History of Present Illness  Anna Carr is a 83 yo female who presented with bright red blood in stool and increased weakness. Admitted for acute blood loss anemia. PMH positive for recent admissions 5/8-5/19 and 6/23-6/25 and 6/30-7/5 with GIB and symptomatic anemia, Right Lower Extremity Critical Limb Ischemia, ESRD on HD TTS, PAD, HFpEF, DM, HTN, anemia and h/o falls.  Clinical Impression  Pt with multiple recent hospitalizations and isn't too far from recent baseline with mobility. From PT standpoint can return home whenever medically ready. Recommend resume HHPT.       If plan is discharge home, recommend the following: A little help with walking and/or transfers;A little help with bathing/dressing/bathroom;Assistance with cooking/housework;Direct supervision/assist for medications management;Direct supervision/assist for financial management;Assist for transportation;Help with stairs or ramp for entrance   Can travel by private vehicle        Equipment Recommendations None recommended by PT  Recommendations for Other Services       Functional Status Assessment       Precautions / Restrictions Precautions Precautions: Fall Recall of Precautions/Restrictions: Intact Restrictions Weight Bearing Restrictions Per Provider Order: No      Mobility  Bed Mobility Overal bed mobility: Modified Independent                  Transfers Overall transfer level: Needs assistance Equipment used: Rolling walker (2 wheels) Transfers: Sit to/from Stand Sit to Stand: Supervision           General transfer comment: supervision for safety    Ambulation/Gait Ambulation/Gait assistance: Supervision Gait Distance (Feet): 15 Feet Assistive device: Rolling walker (2 wheels) Gait Pattern/deviations: Step-through pattern, Decreased stride length Gait  velocity: decr Gait velocity interpretation: <1.31 ft/sec, indicative of household ambulator   General Gait Details: Assist for safety. Distance limited per pt request due to pain and fatigue  Stairs            Wheelchair Mobility     Tilt Bed    Modified Rankin (Stroke Patients Only)       Balance Overall balance assessment: Needs assistance Sitting-balance support: Feet supported Sitting balance-Leahy Scale: Good     Standing balance support: No upper extremity supported, During functional activity Standing balance-Leahy Scale: Fair                               Pertinent Vitals/Pain Pain Assessment Pain Assessment: Faces Faces Pain Scale: Hurts even more Pain Location: RLE with WBing Pain Descriptors / Indicators: Discomfort Pain Intervention(s): Limited activity within patient's tolerance, Repositioned, Monitored during session    Home Living Family/patient expects to be discharged to:: Private residence Living Arrangements: Children Available Help at Discharge: Family;Available 24 hours/day Type of Home: House Home Access: Ramped entrance       Home Layout: One level Home Equipment: Rolling Walker (2 wheels);Grab bars - tub/shower;Wheelchair - manual;Rollator (4 wheels);BSC/3in1;Cane - single point;Shower seat Additional Comments: son is her caretaker, daughter on leave from work right now also helping    Prior Function Prior Level of Function : Needs assist;History of Falls (last six months)             Mobility Comments: RW in the home, intermittent use of WC when weak. Uses WC in community and to go to HD ADLs Comments: Set up A for ADLs     Extremity/Trunk Assessment  Upper Extremity Assessment Upper Extremity Assessment: Defer to OT evaluation    Lower Extremity Assessment Lower Extremity Assessment: Generalized weakness    Cervical / Trunk Assessment Cervical / Trunk Assessment: Kyphotic  Communication    Communication Communication: Impaired Factors Affecting Communication: Hearing impaired    Cognition Arousal: Alert Behavior During Therapy: WFL for tasks assessed/performed   PT - Cognitive impairments: No apparent impairments                         Following commands: Intact       Cueing Cueing Techniques: Verbal cues     General Comments General comments (skin integrity, edema, etc.): VSS on RA    Exercises     Assessment/Plan    PT Assessment    PT Problem List         PT Treatment Interventions      PT Goals (Current goals can be found in the Care Plan section)  Acute Rehab PT Goals Patient Stated Goal: get back to independent PT Goal Formulation: With patient Time For Goal Achievement: 05/19/24 Potential to Achieve Goals: Good    Frequency Min 2X/week     Co-evaluation               AM-PAC PT 6 Clicks Mobility  Outcome Measure Help needed turning from your back to your side while in a flat bed without using bedrails?: None Help needed moving from lying on your back to sitting on the side of a flat bed without using bedrails?: None Help needed moving to and from a bed to a chair (including a wheelchair)?: A Little Help needed standing up from a chair using your arms (e.g., wheelchair or bedside chair)?: A Little Help needed to walk in hospital room?: A Little Help needed climbing 3-5 steps with a railing? : A Little 6 Click Score: 20    End of Session   Activity Tolerance: Patient limited by fatigue Patient left: in chair;with call bell/phone within reach;with family/visitor present Nurse Communication: Mobility status PT Visit Diagnosis: Other abnormalities of gait and mobility (R26.89);Muscle weakness (generalized) (M62.81);Pain Pain - Right/Left: Right Pain - part of body: Ankle and joints of foot    Time: 1349-1357 PT Time Calculation (min) (ACUTE ONLY): 8 min   Charges:   PT Evaluation $PT Eval Low Complexity: 1 Low    PT General Charges $$ ACUTE PT VISIT: 1 Visit         [facility] PT Acute Rehabilitation Services Office [phone]   Rodgers ORN [facility] 05/05/2024, 3:23 PM

## 2024-05-05 NOTE — TOC CM/SW Note (Signed)
 Transition of Care [facility]) - Inpatient Brief Assessment   Patient Details  Name: Anna Carr MRN: [id number] Date of Birth: 1940-12-04  Transition of Care [facility]) CM/SW Contact:    Tom-Johnson, Harvest Muskrat, RN Phone Number: 05/05/2024, 1:14 PM   Clinical Narrative:  Patient presented to the ED with bright red blood per Rectum with hgb at 7.2. 1U PRBC given, hgb today at 7.9.  Patient was recently admissions for GI Bleeds and Symptomatic Anemia.  Has hx of ESRD, on a TTS outpatient schedule.  From home with her son, has two supportive children. Daughter, Verneita lives next door. Children transports to and from appointments. Has a cane, walker, w/c, neb machine, home O2 and bsc at home.  PCP is  Thurmond Cathlyn LABOR, MD and uses [facility] in [place].   Patient is active with [facility].   Patient not Medically ready for discharge.  CM will continue to follow as patient progresses with care towards discharge.            Transition of Care Asessment: Insurance and Status: Insurance coverage has been reviewed Patient has primary care physician: Yes Home environment has been reviewed: Yes Prior level of function:: Modified Independent Prior/Current Home Services: Current home services Social Drivers of Health Review: SDOH reviewed no interventions necessary Readmission risk has been reviewed: Yes Transition of care needs: transition of care needs identified, TOC will continue to follow

## 2024-05-06 LAB — CBC
HCT: 27.9 % — ABNORMAL LOW (ref 36.0–46.0)
Hemoglobin: 9 g/dL — ABNORMAL LOW (ref 12.0–15.0)
MCH: 32.1 pg (ref 26.0–34.0)
MCHC: 32.3 g/dL (ref 30.0–36.0)
MCV: 99.6 fL (ref 80.0–100.0)
Platelets: 199 K/uL (ref 150–400)
RBC: 2.8 MIL/uL — ABNORMAL LOW (ref 3.87–5.11)
RDW: 20 % — ABNORMAL HIGH (ref 11.5–15.5)
WBC: 7.4 K/uL (ref 4.0–10.5)
nRBC: 0.5 % — ABNORMAL HIGH (ref 0.0–0.2)

## 2024-05-06 LAB — GLUCOSE, CAPILLARY
Glucose-Capillary: 95 mg/dL (ref 70–99)
Glucose-Capillary: 159 mg/dL — ABNORMAL HIGH (ref 70–99)
Glucose-Capillary: 235 mg/dL — ABNORMAL HIGH (ref 70–99)

## 2024-05-06 LAB — RENAL FUNCTION PANEL
Albumin: 2.8 g/dL — ABNORMAL LOW (ref 3.5–5.0)
Anion gap: 19 — ABNORMAL HIGH (ref 5–15)
BUN: 44 mg/dL — ABNORMAL HIGH (ref 8–23)
CO2: 24 mmol/L (ref 22–32)
Calcium: 9.2 mg/dL (ref 8.9–10.3)
Chloride: 86 mmol/L — ABNORMAL LOW (ref 98–111)
Creatinine, Ser: 4.67 mg/dL — ABNORMAL HIGH (ref 0.44–1.00)
GFR, Estimated: 9 mL/min — ABNORMAL LOW (ref >60)
Glucose, Bld: 110 mg/dL — ABNORMAL HIGH (ref 70–99)
Phosphorus: 5.7 mg/dL — ABNORMAL HIGH (ref 2.5–4.6)
Potassium: 4.2 mmol/L (ref 3.5–5.1)
Sodium: 129 mmol/L — ABNORMAL LOW (ref 135–145)

## 2024-05-06 MED ORDER — MIDODRINE HCL 5 MG PO TABS
10.0000 mg | ORAL_TABLET | Freq: Three times a day (TID) | ORAL | Status: DC
  Administered 2024-05-06 – 2024-05-09 (×10): 10 mg via ORAL
  Filled 2024-05-06 (×10): qty 2

## 2024-05-06 MED ORDER — POLYETHYLENE GLYCOL 3350 17 G PO PACK
17.0000 g | PACK | Freq: Every day | ORAL | Status: DC
  Administered 2024-05-06 – 2024-05-09 (×4): 17 g via ORAL
  Filled 2024-05-06 (×4): qty 1

## 2024-05-06 MED ORDER — PANTOPRAZOLE SODIUM 40 MG PO TBEC
40.0000 mg | DELAYED_RELEASE_TABLET | Freq: Two times a day (BID) | ORAL | Status: DC
  Administered 2024-05-06 – 2024-05-09 (×7): 40 mg via ORAL
  Filled 2024-05-06 (×7): qty 1

## 2024-05-06 MED ORDER — ORAL CARE MOUTH RINSE: 15.0000 mL | OROMUCOSAL | Status: DC | PRN

## 2024-05-06 NOTE — Plan of Care (Signed)

## 2024-05-06 NOTE — Progress Notes (Addendum)
  [facility] Progress Note   Subjective: Seen in [facility]. UF goal 3L. Feeling ok. She had some gas last night, but better this am. Denies further bleeding.   Objective Vitals:   05/05/24 2113 05/06/24 0618 05/06/24 0828 05/06/24 0842  BP: (!) 103/45 (!) 118/49 (!) 126/57 118/82  Pulse: 81 87 88 87  Resp: 14  20 (!) 25  Temp: (!) 97.5 F (36.4 C) (!) 97.5 F (36.4 C) 97.8 F (36.6 C)   TempSrc: Oral Oral    SpO2: 100% 97% 100%   Weight:   73.9 kg   Height:         Additional Objective Labs: Basic Metabolic Panel: Recent Labs  Lab 05/04/24 1750 05/05/24 0435 05/06/24 0531  NA 135 136 129*  K 3.9 4.3 4.2  CL 93* 94* 86*  CO2 27 27 24   GLUCOSE 163* 120* 110*  BUN 27* 34* 44*  CREATININE 3.14* 3.80* 4.67*  CALCIUM  9.0 9.1 9.2  PHOS  --  5.1* 5.7*   CBC: Recent Labs  Lab 05/04/24 1750 05/05/24 0435 05/05/24 1606 05/06/24 0531  WBC 4.9 5.6  --  7.4  NEUTROABS  --  3.6  --   --   HGB 7.2* 7.9* 8.1* 9.0*  HCT 23.0* 25.1* 25.5* 27.9*  MCV 102.7* 98.8  --  99.6  PLT 202 182  --  199   Blood Culture    Component Value Date/Time   SDES PLEURAL 04/11/2024 1101   SPECREQUEST NONE 04/11/2024 1101   CULT  04/11/2024 1101    NO GROWTH 3 DAYS Performed at [facility], [address], [place], [place] [address]    REPTSTATUS 04/14/2024 FINAL 04/11/2024 1101     Physical Exam General: Alert, nad Heart: RRR Lungs: Clear, normal wob Abdomen: nontender  Extremities: 2+ pitting LE edema  Dialysis Access: AVG  Medications:  albumin  human      ascorbic acid   1,000 mg Oral QHS   Chlorhexidine  Gluconate Cloth  6 each Topical Q0600   clotrimazole    Topical BID   cyanocobalamin   1,000 mcg Oral QPM   darbepoetin (ARANESP ) injection - DIALYSIS  200 mcg Subcutaneous Q Fri-1800   ferric citrate   210 mg Oral TID WC   insulin  aspart  0-9 Units Subcutaneous TID WC   leptospermum manuka honey  1 Application Topical Daily   liver oil-zinc  oxide    Topical BID   midodrine   10 mg Oral TID   pantoprazole   40 mg Oral BID   polyethylene glycol  17 g Oral Daily   rosuvastatin   10 mg Oral Daily   Vitamin E   800 Units Oral q AM    Dialysis Orders:  TTS [facility] 3.5h   B400   70kg  2K bath   LUA AVG  Heparin  none Last OP HD 7/10, post wt 71.1kg Mircera 225 q 2 , last 6/03   Assessment/Plan: ABLA/Acute/chronic GIB. Known AVMs. Received 1U prbcs so far. High risk for GI procedure. Had bradycardic arrest during last EGD. Per primary team. GI consulted.  PAD/RLE ischemia. Unable to tolerate DAPT. VVS consulted.  ESRD. HD TTS. Continue on schedule.  Hypotension. On midodrine .  HFpEF. Volume overload on admit. HD for volume removal.  Anemia. Hgb 8-9. Tsat 26% on 7/3, ferritin 1188. Received Aranesp  200 on 7/11.  MBD. Continue Auryxia  binder.  T2DM. Insulin  per primary  GOC. Health appears to be in decline. Frequent hospital admissions of late. High risk for any procedural interventions. Agree  that she would benefit from GOC discussion.   Maisie Ronnald Acosta PA-C [facility] 05/06/2024,9:06 AM

## 2024-05-06 NOTE — Progress Notes (Cosign Needed Addendum)
 HD#0 SUBJECTIVE:  Patient Summary: Anna Carr is a 83 y.o. person living with a history of recurrent GI bleeds with duodenal AVMs, ESRD on HD (TTS), HFpEF, PAD with RLE critical limb ischemia without revascularization option, who presents to the ED for bright red blood per rectum and admitted for acute blood loss anemia. Overnight Events: No overnight events   Interim History: Patient states this morning that she was able to talk to the GI and vascular surgery team yesterday. Daughter was not at bedside but patient reports that it would be better to have conversation about a plan moving forward while daughter is also there. Patient reports that she has been having some epigastric pain that she believes comes at times when she eats. She reports belching. She also reports that she is unsure of her last bowel movement but has not had one today. No other complaints at this time.  OBJECTIVE:  Vital Signs: Vitals:   05/05/24 0934 05/05/24 1701 05/05/24 2113 05/06/24 0618  BP: (!) 98/53 (!) 90/43 (!) 103/45 (!) 118/49  Pulse: 93 (!) 54 81 87  Resp: 18 18 14    Temp: (!) 97.3 F (36.3 C) 97.6 F (36.4 C) (!) 97.5 F (36.4 C) (!) 97.5 F (36.4 C)  TempSrc: Oral Oral Oral Oral  SpO2: 92% (!) 83% 100% 97%  Weight:      Height:       Supplemental O2: Room Air SpO2: 97 %  Filed Weights   05/04/24 1744  Weight: 69.4 kg     Intake/Output Summary (Last 24 hours) at 05/06/2024 0631 Last data filed at 05/06/2024 0200 Gross per 24 hour  Intake 240 ml  Output 0 ml  Net 240 ml   Net IO Since Admission: 240 mL [05/06/24 0631]  Physical Exam: Physical Exam Cardiovascular:     Rate and Rhythm: Normal rate and regular rhythm.     Heart sounds: Normal heart sounds.  Pulmonary:     Effort: Pulmonary effort is normal.     Breath sounds: Normal breath sounds.  Abdominal:     General: Bowel sounds are normal.     Palpations: Abdomen is soft.     Tenderness: There is no abdominal  tenderness. There is no guarding or rebound.  Musculoskeletal:     Right lower leg: Edema present.     Left lower leg: Edema present.     Comments: Patient's improved erythema from prior admission, less pronounced. Patient has pitting pedal edema in right foot. No TP or DP palpated. Left and Right lower extremities are cool to the touch in the anterior shin area bilaterally   Neurological:     Mental Status: She is alert.     Patient Lines/Drains/Airways Status     Active Line/Drains/Airways     Name Placement date Placement time Site Days   Peripheral IV 05/04/24 22 G 1.75 Right;Anterior Forearm 05/04/24  1825  Forearm  1   Peripheral IV 05/04/24 22 G Posterior;Right Forearm 05/04/24  2240  Forearm  1   Fistula / Graft Left Upper arm Arteriovenous vein graft 03/12/21  1254  Upper arm  1150   Wound / Incision (Open or Dehisced) 02/26/24 Other (Comment) Knee Right;Left;Anterior bilateral knee abrasion 02/26/24  1900  Knee  69   Wound 04/10/24 Other (Comment) Leg Right 04/10/24  --  Leg  25   Wound 04/12/24 0534 Other (Comment) Arm Lower;Posterior;Proximal;Right 04/12/24  0534  Arm  23   Wound 04/12/24 0536 Other (Comment) Thigh  Posterior;Proximal;Right 04/12/24  0536  Thigh  23   Wound 04/12/24 0539 Other (Comment) Pretibial Distal;Right 04/12/24  0539  Pretibial  23   Wound 04/12/24 0541 Pressure Injury Heel Posterior;Right Stage 2 -  Partial thickness loss of dermis presenting as a shallow open injury with a red, pink wound bed without slough. 04/12/24  0541  Heel  23   Wound 04/12/24 0542 Pressure Injury Heel Left;Posterior Stage 1 -  Intact skin with non-blanchable redness of a localized area usually over a bony prominence. 04/12/24  0542  Heel  23   Wound 04/12/24 0543 Other (Comment) Toe (Comment  which one) Anterior;Right 04/12/24  0543  Toe (Comment  which one)  23   Wound 04/12/24 0544 Other (Comment) Toe (Comment  which one) Anterior;Right 04/12/24  0544  Toe (Comment  which one)   23   Wound 04/12/24 1322 Other (Comment) Arm Anterior;Left;Lower 04/12/24  1322  Arm  23   Wound 05/05/24 0930 Foot Anterior;Right 05/05/24  0930  Foot  less than 1            Pertinent labs and imaging:      Latest Ref Rng & Units 05/05/2024    4:06 PM 05/05/2024    4:35 AM 05/04/2024    5:50 PM  CBC  WBC 4.0 - 10.5 K/uL  5.6  4.9   Hemoglobin 12.0 - 15.0 g/dL 8.1  7.9  7.2   Hematocrit 36.0 - 46.0 % 25.5  25.1  23.0   Platelets 150 - 400 K/uL  182  202        Latest Ref Rng & Units 05/05/2024    4:35 AM 05/04/2024    5:50 PM 04/29/2024    5:25 AM  CMP  Glucose 70 - 99 mg/dL 879  836  870   BUN 8 - 23 mg/dL 34  27  42   Creatinine 0.44 - 1.00 mg/dL 6.19  6.85  4.61   Sodium 135 - 145 mmol/L 136  135  126   Potassium 3.5 - 5.1 mmol/L 4.3  3.9  4.0   Chloride 98 - 111 mmol/L 94  93  87   CO2 22 - 32 mmol/L 27  27  24    Calcium  8.9 - 10.3 mg/dL 9.1  9.0  9.2   Total Protein 6.5 - 8.1 g/dL  6.0    Total Bilirubin 0.0 - 1.2 mg/dL  1.2    Alkaline Phos 38 - 126 U/L  108    AST 15 - 41 U/L  48    ALT 0 - 44 U/L  31      No results found.  ASSESSMENT/PLAN:  Assessment: Principal Problem:   Acute blood loss anemia Active Problems:   Anemia in chronic kidney disease   CHF with left ventricular diastolic dysfunction, NYHA class 1 ([name])   Type 2 diabetes mellitus with chronic kidney disease on chronic dialysis, with long-term current use of insulin  ([name])   ESRD on hemodialysis ([name])   Hypertension   Mitral regurgitation moderate to severe based on echocardiogram from 2022   Peripheral vascular disease, unspecified ([name]) critical stenosis lower extremity   Angiodysplasia of small intestine  Anna Carr is a 83 y.o. person living with a history of recurrent GI bleeds with duodenal AVMs, ESRD on HD (TTS), HFpEF, PAD with RLE critical limb ischemia without revascularization option, who presents to the ED for bright red blood per rectum and admitted for acute blood loss  anemia  on 7/10.   Plan: Acute Blood Loss Anemia Hematochezia Duodenal AVMs Patient has a history of GI bleed complicated by duodenal AVMs, which has not been treated due to bradycardic arrest during EGD. At discharge on 7/5, Hgb was 9.3 and on this admission was 7.2 Daughter showed picture of dark stool with bright red blood in the toilet bowl. Patient has received 1u PRBC. Family and patient counseled on risks of procedure. At previous admission family was counseled on the fact that AVMs would likely create slow continuous or recurrent bleed. Risk of procedure is high with this patient due to bradycardic arrest in the past. Family wanted to have risks/benefits discussion with GI about procedure. This patient would likely benefit from GOC discussion.  - GI was consulted and says that endoscopic evaluation is too high risk. Also recommends establishing care with a hematologist outpatient for transfusions of IV iron  and PRBCs - Iron  studies from previous admission showed iron  as 73, Sat Ratio of 26 and ferritin 1,188.Anemia likely secondary to Anemia of chronic disease and chronic kidney disease.  - Hgb is 9 today. Afternoon H&H ordered  - Holding DAPT in the setting of GI bleed - Monitor CBC -Transfuse if Hgb <7 or in setting of acute blood loss  - Page for any acute hemodynamic changes, hematochezia, hematemesis, or large-volume melena   Right Lower Extremity Critical Limb Ischemia PAD Patient had presented to [facility] ED on prior admission for weakness and was found to have right critical limb ischemia. Dr. Lanis with vascular surgery was seeing patient at last hospitalization. Was recommended for patient to start DAPT, which patient tolerated well while in the hospital. Patient is at high risk for complications with revascularization and does not have amenable option for vein graft. Patient finished antibiotic course for suspected cellulitis of right foot, she reports.   - wound  care -Vascular surgery does not plan any interventions during hospitalization. Patient ha been told that if she is not able to tolerate DAPT, then she will not be a revascularization candidate.  -Holding DAPT due to GI bleed  -tylenol  and oxycodone  5 mg q6h PRN  Hypertension Patient's blood pressure has tendency to run on the hypotensive side.  -Will continue to monitor  - Hold home carvedilol    ESRD on HD TTS Patient will have HD today. Will continue HD TTS schedule.  - Monitor CBC and Renal Function Panel - Nephrology consulted and aware of patient  HFpEF 50-55% Patient has some volume overload with lower extremity edema but no dyspnea. Had HD two days ago, which is the best way to be able to take volume off of her. She is due for HD on today  Echocardiogram on 04/10/2024 showed LVEF of 50-55% with LVH. Bilateral atria dilated. Moderate mitral and triscuspid regurgitation. Calcification of aortic valve. - Resume Lasix  80mg  - Monitor I&Os  Type 2 Diabetes Home medication includes Soliqua  6u each morning. Last A1c 5.8 on 03/15/2024. - Sensitive SSI while on clear liquid diet  Best Practice: Diet: Clear liquid diet IVF: Fluids:  VTE: held for bleeding, SCDs c/i with severe PAD Code: Full  Disposition planning: Therapy Recs:  Family Contact: to be notified. DISPO: Anticipated discharge pending clinical improvement  Signature:  Kare Dado D'Mello [facility]  6:31 AM, 05/06/2024  On Call pager [phone]

## 2024-05-06 NOTE — Hospital Course (Signed)
 Pt states she does not like broth, has drank some ginger ale and coffee but has not had much to eat;  but has an appetite, asked for food this AM. Diet was changed.  She spoke to GI doctor She states vascular said they wouldn't do procedure She said she had abd pain, related to gas or not yet having BM, miralax . No pain at this moment. She took cinacon last night Last BM on wed  Discussed dialysis, unsure of time but likely PM  Asked if she could go home tomorrow, explained that it depends on blood counts. She states someone told her she could get set up with hematologist, understands that she'll likely need transfusion. Explained blood doctor would manage blood counts. States she is taking iron  binder with meals.  Dr reiterated that it was important to have a conversation later regarding goals of care. Explained that we will monitor blood counts and ensure she doesn't need transfusion  No further questions or concerns.  Physical exam: No abd tenderness No LLE edema  Acute Blood Loss Anemia Hematochezia Duodenal AVMs Patient has a history of GI bleed complicated by duodenal AVMs, which has not been treated due to bradycardic arrest during EGD. At discharge on 7/5, Hgb was 9.3 and on this admission was 7.2  Patient was discharged on 7/5, and was sent home on DAPT due to outpatient evaluation with vascular surgery for revascularization.On 7/7 patient reported feeling weak. She had reported weakness at previous hospital admission as well. Due to her weakness, patient presented at [facility] and was sent home in stable condition. Patient then started having multiple bloody stools and called Vascular Surgery office who told her to discontinue DAPT. Patient was  Daughter showed picture of dark stool with bright red blood in the toilet bowl.Patient has received 1u PRBC.

## 2024-05-06 NOTE — Progress Notes (Addendum)
 Daughter wants team aware pt can barely walk today due to pain while bearing weight/ambulating. Per daughter she had pain ambulating yesterday but not like today. Pt declined PRN Oxycodone . Rosalyn D'Mello, D.O. informed.

## 2024-05-07 DIAGNOSIS — K921 Melena: Secondary | ICD-10-CM | POA: Diagnosis not present

## 2024-05-07 DIAGNOSIS — D62 Acute posthemorrhagic anemia: Secondary | ICD-10-CM | POA: Diagnosis not present

## 2024-05-07 DIAGNOSIS — K31819 Angiodysplasia of stomach and duodenum without bleeding: Secondary | ICD-10-CM | POA: Diagnosis not present

## 2024-05-07 LAB — RENAL FUNCTION PANEL
Albumin: 2.6 g/dL — ABNORMAL LOW (ref 3.5–5.0)
Anion gap: 16 — ABNORMAL HIGH (ref 5–15)
BUN: 36 mg/dL — ABNORMAL HIGH (ref 8–23)
CO2: 22 mmol/L (ref 22–32)
Calcium: 8.7 mg/dL — ABNORMAL LOW (ref 8.9–10.3)
Chloride: 91 mmol/L — ABNORMAL LOW (ref 98–111)
Creatinine, Ser: 4.64 mg/dL — ABNORMAL HIGH (ref 0.44–1.00)
GFR, Estimated: 9 mL/min — ABNORMAL LOW (ref >60)
Glucose, Bld: 196 mg/dL — ABNORMAL HIGH (ref 70–99)
Phosphorus: 4.6 mg/dL (ref 2.5–4.6)
Potassium: 4.2 mmol/L (ref 3.5–5.1)
Sodium: 129 mmol/L — ABNORMAL LOW (ref 135–145)

## 2024-05-07 LAB — CBC
HCT: 28.7 % — ABNORMAL LOW (ref 36.0–46.0)
Hemoglobin: 9.1 g/dL — ABNORMAL LOW (ref 12.0–15.0)
MCH: 32.3 pg (ref 26.0–34.0)
MCHC: 31.7 g/dL (ref 30.0–36.0)
MCV: 101.8 fL — ABNORMAL HIGH (ref 80.0–100.0)
Platelets: 185 K/uL (ref 150–400)
RBC: 2.82 MIL/uL — ABNORMAL LOW (ref 3.87–5.11)
RDW: 20 % — ABNORMAL HIGH (ref 11.5–15.5)
WBC: 8.6 K/uL (ref 4.0–10.5)
nRBC: 0.5 % — ABNORMAL HIGH (ref 0.0–0.2)

## 2024-05-07 LAB — GLUCOSE, CAPILLARY
Glucose-Capillary: 180 mg/dL — ABNORMAL HIGH (ref 70–99)
Glucose-Capillary: 164 mg/dL — ABNORMAL HIGH (ref 70–99)
Glucose-Capillary: 170 mg/dL — ABNORMAL HIGH (ref 70–99)
Glucose-Capillary: 123 mg/dL — ABNORMAL HIGH (ref 70–99)

## 2024-05-07 MED ORDER — SENNA 8.6 MG PO TABS
2.0000 | ORAL_TABLET | Freq: Every day | ORAL | Status: DC
  Administered 2024-05-08 – 2024-05-09 (×2): 17.2 mg via ORAL
  Filled 2024-05-07 (×2): qty 2

## 2024-05-07 MED ORDER — ONDANSETRON 4 MG PO TBDP
4.0000 mg | ORAL_TABLET | Freq: Three times a day (TID) | ORAL | Status: DC | PRN
  Administered 2024-05-07: 4 mg via ORAL
  Filled 2024-05-07: qty 1

## 2024-05-07 MED ORDER — SENNA 8.6 MG PO TABS
1.0000 | ORAL_TABLET | Freq: Every day | ORAL | Status: DC
  Administered 2024-05-07: 8.6 mg via ORAL
  Filled 2024-05-07: qty 1

## 2024-05-07 NOTE — Plan of Care (Signed)

## 2024-05-07 NOTE — Progress Notes (Signed)
 Spoke with patient's daughter at bedside who assists with decision making for patient. Patient was sleeping at this time but had told primary team to discuss patient's care with daughter earlier today. Discussed current care plan and reviewed consult notes from GI and VVS with daughter. Reiterated previous discussions and discussion patient and family had with GI team yesterday regarding she is not a candidate for further endoscopic procedures. Discussed limitations with intervention such as revascularization with VVS due to not tolerating DAPT. Patient's daughter voiced understanding of this and we discussed that patient will likely have chronic GI blood loss and likely need further blood transfusions. This was discussed at prior admission when initiating DAPT with patient and daughter. Daughter is aware of this difficult situation and is hopeful that patient to remain outside of the hospital longer than the past few months, hopeful to establish with outpatient hematology in [place]. We briefly discussed code status and my concerns for outcomes, will need to further discuss with patient and daughter. Daughter is agreeable to further discussions with palliative medicine team for patient's GOC.

## 2024-05-07 NOTE — Progress Notes (Signed)
 [facility] Progress Note   Subjective:  Completed dialysis yesterday. Tolerated 3L UF. Seen in room, up in recliner. Feels weak, uncomfortable. Stomach feels bloated. Denies further bloody BMs.  Objective Vitals:   05/06/24 1723 05/06/24 2120 05/07/24 0407 05/07/24 0711  BP: 116/80 108/65 (!) 113/47 (!) 90/37  Pulse: 73 83 84 84  Resp:  19 16   Temp:  98.1 F (36.7 C) 98 F (36.7 C) 98 F (36.7 C)  TempSrc:  Oral  Oral  SpO2: 92% 90% 96% 92%  Weight:      Height:         Additional Objective Labs: Basic Metabolic Panel: Recent Labs  Lab 05/04/24 1750 05/05/24 0435 05/06/24 0531  NA 135 136 129*  K 3.9 4.3 4.2  CL 93* 94* 86*  CO2 27 27 24   GLUCOSE 163* 120* 110*  BUN 27* 34* 44*  CREATININE 3.14* 3.80* 4.67*  CALCIUM  9.0 9.1 9.2  PHOS  --  5.1* 5.7*   CBC: Recent Labs  Lab 05/04/24 1750 05/05/24 0435 05/05/24 1606 05/06/24 0531  WBC 4.9 5.6  --  7.4  NEUTROABS  --  3.6  --   --   HGB 7.2* 7.9* 8.1* 9.0*  HCT 23.0* 25.1* 25.5* 27.9*  MCV 102.7* 98.8  --  99.6  PLT 202 182  --  199   Blood Culture    Component Value Date/Time   SDES PLEURAL 04/11/2024 1101   SPECREQUEST NONE 04/11/2024 1101   CULT  04/11/2024 1101    NO GROWTH 3 DAYS Performed at [facility], [address] [place], [place] [address]    REPTSTATUS 04/14/2024 FINAL 04/11/2024 1101     Physical Exam General: Alert, nad Heart: RRR Lungs: Clear, normal wob Abdomen: nontender  Extremities: 2+ pitting LE edema  Dialysis Access: AVG  Medications:  albumin  human      ascorbic acid   1,000 mg Oral QHS   Chlorhexidine  Gluconate Cloth  6 each Topical Q0600   clotrimazole    Topical BID   cyanocobalamin   1,000 mcg Oral QPM   darbepoetin (ARANESP ) injection - DIALYSIS  200 mcg Subcutaneous Q Fri-1800   ferric citrate   210 mg Oral TID WC   insulin  aspart  0-9 Units Subcutaneous TID WC   leptospermum manuka honey  1 Application Topical Daily   liver oil-zinc   oxide   Topical BID   midodrine   10 mg Oral TID   pantoprazole   40 mg Oral BID   polyethylene glycol  17 g Oral Daily   rosuvastatin   10 mg Oral Daily   senna  1 tablet Oral Daily   Vitamin E   800 Units Oral q AM    Dialysis Orders:  TTS [place] 3.5h   B400   70kg  2K bath   LUA AVG  Heparin  none Last OP HD 7/10, post wt 71.1kg Mircera 225 q 2 , last 6/03   Assessment/Plan: ABLA/Acute/chronic GIB. Known AVMs. Received 1U prbcs so far. High risk for GI procedure. Had bradycardic arrest during last EGD. Per primary team. GI consulted.  PAD/RLE ischemia. Unable to tolerate DAPT. VVS consulted.  ESRD. HD TTS. Continue on schedule. Next HD Tues.  Hypotension. On midodrine  TID.  HFpEF. Volume overload on admit. Continue HD for volume removal.  Anemia. Hgb 9.0. Tsat 26% on 7/3, ferritin 1188. Received Aranesp  200 on 7/11.  MBD. Continue Auryxia  binder.  T2DM. Insulin  per primary  GOC. Health appears to be in decline. Frequent hospital admissions  of late. High risk for any procedural interventions. Agree that she would benefit from GOC discussion.   Anna Ronnald Acosta PA-C [facility] 05/07/2024,10:42 AM

## 2024-05-07 NOTE — Progress Notes (Signed)
 HD#0 SUBJECTIVE:  Patient Summary: Anna Carr is a 83 y.o. person living with a history of recurrent GI bleeds with duodenal AVMs, ESRD on HD (TTS), HFpEF, PAD with RLE critical limb ischemia without revascularization option, who presents to the ED for bright red blood per rectum and admitted for acute blood loss anemia. Overnight Events: Nausea without vomiting at 4 am, was given Zofran    Interim History: Patient reports that she has been having increased pain with foot movement, especially with bearing weight. This is in her right foot where she has critical limb ischemia. Patient had prn medications on file that she was offered yesterday for pain including oxycodone , but RN reported yesterday that she declined this medication as it does not make her feel well. She does believe that the swelling in her legs has improved. The patient reports that she had a small hard bowel movement today. Patient was informed that she has scheduled bowel regimen. No other complaints at this time.  OBJECTIVE:  Vital Signs: Vitals:   05/06/24 1255 05/06/24 1723 05/06/24 2120 05/07/24 0407  BP: (!) 133/59 116/80 108/65 (!) 113/47  Pulse: 85 73 83 84  Resp: 18  19 16   Temp: 97.7 F (36.5 C)  98.1 F (36.7 C) 98 F (36.7 C)  TempSrc: Oral  Oral   SpO2: 100% 92% 90% 96%  Weight:      Height:       Supplemental O2: Room Air SpO2: 96 % O2 Flow Rate (L/min): 2 L/min  Filed Weights   05/06/24 0828 05/06/24 1148 05/06/24 1153  Weight: 73.9 kg 70.3 kg 70.3 kg     Intake/Output Summary (Last 24 hours) at 05/07/2024 9394 Last data filed at 05/07/2024 0200 Gross per 24 hour  Intake 480 ml  Output 3000 ml  Net -2520 ml   Net IO Since Admission: -2,280 mL [05/07/24 0605]  Physical Exam: Physical Exam Cardiovascular:     Rate and Rhythm: Normal rate and regular rhythm.     Heart sounds: Normal heart sounds.  Pulmonary:     Effort: Pulmonary effort is normal.     Breath sounds: Normal breath  sounds.  Abdominal:     General: Bowel sounds are normal.     Palpations: Abdomen is soft.     Tenderness: There is no abdominal tenderness. There is no guarding or rebound.  Musculoskeletal:     Right lower leg: Edema present.     Left lower leg: Edema present.     Comments: Patient's right foot is ruborous with punched out ulcers scattered around right foot and medial lower extremity.  Patient has pedal edema in right foot. No TP or DP palpated. Left and Right lower extremities are cool to the touch in the anterior shin area bilaterally. Right foot and toes are cool to the touch and sensitive as well.    Neurological:     Mental Status: She is alert.     Patient Lines/Drains/Airways Status     Active Line/Drains/Airways     Name Placement date Placement time Site Days   Peripheral IV 05/04/24 22 G 1.75 Right;Anterior Forearm 05/04/24  1825  Forearm  1   Peripheral IV 05/04/24 22 G Posterior;Right Forearm 05/04/24  2240  Forearm  1   Fistula / Graft Left Upper arm Arteriovenous vein graft 03/12/21  1254  Upper arm  1150   Wound / Incision (Open or Dehisced) 02/26/24 Other (Comment) Knee Right;Left;Anterior bilateral knee abrasion 02/26/24  1900  Knee  69   Wound 04/10/24 Other (Comment) Leg Right 04/10/24  --  Leg  25   Wound 04/12/24 0534 Other (Comment) Arm Lower;Posterior;Proximal;Right 04/12/24  0534  Arm  23   Wound 04/12/24 0536 Other (Comment) Thigh Posterior;Proximal;Right 04/12/24  0536  Thigh  23   Wound 04/12/24 0539 Other (Comment) Pretibial Distal;Right 04/12/24  0539  Pretibial  23   Wound 04/12/24 0541 Pressure Injury Heel Posterior;Right Stage 2 -  Partial thickness loss of dermis presenting as a shallow open injury with a red, pink wound bed without slough. 04/12/24  0541  Heel  23   Wound 04/12/24 0542 Pressure Injury Heel Left;Posterior Stage 1 -  Intact skin with non-blanchable redness of a localized area usually over a bony prominence. 04/12/24  0542  Heel  23    Wound 04/12/24 0543 Other (Comment) Toe (Comment  which one) Anterior;Right 04/12/24  0543  Toe (Comment  which one)  23   Wound 04/12/24 0544 Other (Comment) Toe (Comment  which one) Anterior;Right 04/12/24  0544  Toe (Comment  which one)  23   Wound 04/12/24 1322 Other (Comment) Arm Anterior;Left;Lower 04/12/24  1322  Arm  23   Wound 05/05/24 0930 Foot Anterior;Right 05/05/24  0930  Foot  less than 1            Pertinent labs and imaging:      Latest Ref Rng & Units 05/06/2024    5:31 AM 05/05/2024    4:06 PM 05/05/2024    4:35 AM  CBC  WBC 4.0 - 10.5 K/uL 7.4   5.6   Hemoglobin 12.0 - 15.0 g/dL 9.0  8.1  7.9   Hematocrit 36.0 - 46.0 % 27.9  25.5  25.1   Platelets 150 - 400 K/uL 199   182        Latest Ref Rng & Units 05/06/2024    5:31 AM 05/05/2024    4:35 AM 05/04/2024    5:50 PM  CMP  Glucose 70 - 99 mg/dL 889  879  836   BUN 8 - 23 mg/dL 44  34  27   Creatinine 0.44 - 1.00 mg/dL 5.32  6.19  6.85   Sodium 135 - 145 mmol/L 129  136  135   Potassium 3.5 - 5.1 mmol/L 4.2  4.3  3.9   Chloride 98 - 111 mmol/L 86  94  93   CO2 22 - 32 mmol/L 24  27  27    Calcium  8.9 - 10.3 mg/dL 9.2  9.1  9.0   Total Protein 6.5 - 8.1 g/dL   6.0   Total Bilirubin 0.0 - 1.2 mg/dL   1.2   Alkaline Phos 38 - 126 U/L   108   AST 15 - 41 U/L   48   ALT 0 - 44 U/L   31     No results found.  ASSESSMENT/PLAN:  Assessment: Principal Problem:   Acute blood loss anemia Active Problems:   Anemia in chronic kidney disease   CHF with left ventricular diastolic dysfunction, NYHA class 1 ([name])   Type 2 diabetes mellitus with chronic kidney disease on chronic dialysis, with long-term current use of insulin  ([name])   ESRD on hemodialysis ([name])   Hypertension   Mitral regurgitation moderate to severe based on echocardiogram from 2022   Peripheral vascular disease, unspecified ([name]) critical stenosis lower extremity   Angiodysplasia of small intestine  Anna Carr is a 83 y.o. person living  with a history  of recurrent GI bleeds with duodenal AVMs, ESRD on HD (TTS), HFpEF, PAD with RLE critical limb ischemia without revascularization option, who presents to the ED for bright red blood per rectum and admitted for acute blood loss anemia  on 7/10.   Plan: Acute Blood Loss Anemia Hematochezia Duodenal AVMs Patient has a history of GI bleed complicated by duodenal AVMs, which has not been treated due to bradycardic arrest during EGD. At discharge on 7/5, Hgb was 9.3 and on this admission was 7.2 Daughter showed picture of dark stool with bright red blood in the toilet bowl. Patient has received 1u PRBC. At previous admission family was counseled on the fact that AVMs would likely create slow continuous or recurrent bleed. This patient will not be candidate for endoscopic interventions. Patient was supposed to be evaluated for revascularizing outpatient but was not able to tolerate DAPT. Vascular will re-evaluate tomorrow.  - GI recommends  care with a hematologist outpatient for transfusions of IV iron  and PRBCs.Also suggests that patient could consider LAR octreotide - Iron  studies from previous admission showed iron  as 73, Sat Ratio of 26 and ferritin 1,188.Anemia likely secondary to Anemia of chronic disease and chronic kidney disease.  - Hgb is 9.1. Holding stable  - Holding DAPT in the setting of GI bleed. Patient will likely not be able to tolerate it and will have to be discontinued  - Monitor CBC -Transfuse if Hgb <7 or in setting of acute blood loss  - Page for any acute hemodynamic changes, hematochezia, hematemesis, or large-volume melena   Right Lower Extremity Critical Limb Ischemia PAD Patient had presented to [facility] ED on prior admission for weakness and was found to have right critical limb ischemia. Dr. Lanis with vascular surgery was seeing patient at last hospitalization. Was recommended for patient to start DAPT, which patient tolerated well while in the hospital.  Patient is at high risk for complications with revascularization and does not have amenable option for vein graft. Patient finished antibiotic course for suspected cellulitis of right foot, she reports.   - wound care -Vascular surgery will reevaluate tomorrow. Patient has been told that if she is not able to tolerate DAPT, then she will not be a revascularization candidate.  -Holding DAPT due to GI bleed  -tylenol  and oxycodone  5 mg q6h PRN  GOC - Conversation was had with patient's daughter yesterday about patient not being able to tolerate DAPT, having history of GI bleeds and no further endoscopic intervention per GI. Was also discussed with daughter that patient is high risk for procedures. Daughter was understanding of the situation and when offered to have palliative consult, daughter agreed -palliative consult   Hypertension Patient's blood pressure has tendency to run on the hypotensive side.  -Will continue to monitor  - Hold home carvedilol    ESRD on HD TTS Patient will have HD today. Will continue HD TTS schedule.  - Monitor CBC and Renal Function Panel - Nephrology consulted and aware of patient  HFpEF 50-55% Patient has some volume overload with lower extremity edema but no dyspnea. Had HD yesterday and got 3L off Echocardiogram on 04/10/2024 showed LVEF of 50-55% with LVH. Bilateral atria dilated. Moderate mitral and triscuspid regurgitation. Calcification of aortic valve. - Can consider discontinuing lasix  as patient does not have any urine output due to history of ESRD  - Monitor I&Os  Type 2 Diabetes Home medication includes Soliqua  6u each morning. Last A1c 5.8 on 03/15/2024. - Sensitive SSI while on clear liquid  diet  Best Practice: Diet: Clear liquid diet IVF: Fluids:  VTE: held for bleeding, SCDs c/i with severe PAD Code: Full  Disposition planning: Therapy Recs:  Family Contact: to be notified. DISPO: Anticipated discharge pending clinical  improvement  Signature:  Shyane Fossum D'[facility]ello [facility]  6:05 AM, 05/07/2024  On Call pager [phone]

## 2024-05-08 DIAGNOSIS — R531 Weakness: Secondary | ICD-10-CM

## 2024-05-08 DIAGNOSIS — Z515 Encounter for palliative care: Secondary | ICD-10-CM

## 2024-05-08 DIAGNOSIS — I739 Peripheral vascular disease, unspecified: Secondary | ICD-10-CM | POA: Diagnosis not present

## 2024-05-08 DIAGNOSIS — K31819 Angiodysplasia of stomach and duodenum without bleeding: Secondary | ICD-10-CM | POA: Diagnosis not present

## 2024-05-08 DIAGNOSIS — M79604 Pain in right leg: Secondary | ICD-10-CM

## 2024-05-08 DIAGNOSIS — M7989 Other specified soft tissue disorders: Secondary | ICD-10-CM

## 2024-05-08 DIAGNOSIS — K921 Melena: Secondary | ICD-10-CM | POA: Diagnosis not present

## 2024-05-08 DIAGNOSIS — D62 Acute posthemorrhagic anemia: Principal | ICD-10-CM

## 2024-05-08 DIAGNOSIS — Z992 Dependence on renal dialysis: Secondary | ICD-10-CM

## 2024-05-08 DIAGNOSIS — S91301A Unspecified open wound, right foot, initial encounter: Secondary | ICD-10-CM | POA: Diagnosis not present

## 2024-05-08 DIAGNOSIS — Z7189 Other specified counseling: Secondary | ICD-10-CM

## 2024-05-08 DIAGNOSIS — K922 Gastrointestinal hemorrhage, unspecified: Secondary | ICD-10-CM | POA: Diagnosis not present

## 2024-05-08 DIAGNOSIS — N186 End stage renal disease: Secondary | ICD-10-CM

## 2024-05-08 LAB — RENAL FUNCTION PANEL
Albumin: 2.6 g/dL — ABNORMAL LOW (ref 3.5–5.0)
Anion gap: 17 — ABNORMAL HIGH (ref 5–15)
BUN: 38 mg/dL — ABNORMAL HIGH (ref 8–23)
CO2: 21 mmol/L — ABNORMAL LOW (ref 22–32)
Calcium: 8.9 mg/dL (ref 8.9–10.3)
Chloride: 90 mmol/L — ABNORMAL LOW (ref 98–111)
Creatinine, Ser: 5.43 mg/dL — ABNORMAL HIGH (ref 0.44–1.00)
GFR, Estimated: 7 mL/min — ABNORMAL LOW (ref >60)
Glucose, Bld: 116 mg/dL — ABNORMAL HIGH (ref 70–99)
Phosphorus: 4.9 mg/dL — ABNORMAL HIGH (ref 2.5–4.6)
Potassium: 5.1 mmol/L (ref 3.5–5.1)
Sodium: 128 mmol/L — ABNORMAL LOW (ref 135–145)

## 2024-05-08 LAB — GLUCOSE, CAPILLARY
Glucose-Capillary: 250 mg/dL — ABNORMAL HIGH (ref 70–99)
Glucose-Capillary: 191 mg/dL — ABNORMAL HIGH (ref 70–99)
Glucose-Capillary: 177 mg/dL — ABNORMAL HIGH (ref 70–99)
Glucose-Capillary: 253 mg/dL — ABNORMAL HIGH (ref 70–99)

## 2024-05-08 LAB — CBC
HCT: 31.2 % — ABNORMAL LOW (ref 36.0–46.0)
Hemoglobin: 9.8 g/dL — ABNORMAL LOW (ref 12.0–15.0)
MCH: 31.7 pg (ref 26.0–34.0)
MCHC: 31.4 g/dL (ref 30.0–36.0)
MCV: 101 fL — ABNORMAL HIGH (ref 80.0–100.0)
Platelets: 190 K/uL (ref 150–400)
RBC: 3.09 MIL/uL — ABNORMAL LOW (ref 3.87–5.11)
RDW: 19.9 % — ABNORMAL HIGH (ref 11.5–15.5)
WBC: 10 K/uL (ref 4.0–10.5)
nRBC: 0.3 % — ABNORMAL HIGH (ref 0.0–0.2)

## 2024-05-08 LAB — HEPATITIS B SURFACE ANTIGEN: Hepatitis B Surface Ag: NONREACTIVE

## 2024-05-08 MED ORDER — CEPHALEXIN 500 MG PO CAPS
500.0000 mg | ORAL_CAPSULE | Freq: Two times a day (BID) | ORAL | Status: DC
  Administered 2024-05-08 – 2024-05-09 (×2): 500 mg via ORAL
  Filled 2024-05-08 (×2): qty 1

## 2024-05-08 MED ORDER — ACETAMINOPHEN 500 MG PO TABS
1000.0000 mg | ORAL_TABLET | Freq: Three times a day (TID) | ORAL | Status: DC
  Administered 2024-05-08 – 2024-05-09 (×2): 1000 mg via ORAL
  Filled 2024-05-08 (×3): qty 2

## 2024-05-08 NOTE — Care Management Obs Status (Signed)
 MEDICARE OBSERVATION STATUS NOTIFICATION   Patient Details  Name: [name] MRN: [id number] Date of Birth: October 13, 1941   Medicare Observation Status Notification Given:       [name] 05/08/2024, 10:22 AM

## 2024-05-08 NOTE — Care Management Obs Status (Signed)
 MEDICARE OBSERVATION STATUS NOTIFICATION   Patient Details  Name: Anna Carr MRN: [id number] Date of Birth: [birth year]   Medicare Observation Status Notification Given:  Yes  Letter signed and copy given  [name] 05/08/2024, 10:36 AM

## 2024-05-08 NOTE — Progress Notes (Signed)
 Pt receives out-pt HD at [facility] on TTS 11:00 am chair time. Will assist as needed.   Lauraine Polite Renal Navigator [phone]

## 2024-05-08 NOTE — Consult Note (Signed)
 Consultation Note Date: 05/08/2024   Patient Name: Anna Carr  DOB: Mar 18, 1941  MRN: [id number]  Age / Sex: 83 y.o., female  PCP: Anna Cathlyn LABOR., MD Referring Physician: Trudy Mliss Dragon, MD  Reason for Consultation: Establishing goals of care  HPI/Patient Profile: 83 y.o. female  with past medical history of  of depression, hypertension, hyperlipidemia, CHF, COPD, SIADH, DM II, ESRD on HD, chronic anemia, GI bleed secondary to small bowel AVMs, C. Diff colitis and colon polyps.  admitted on 05/04/2024 with PAD with concern for RLE critical lim ischemia. She also presented for hematochezia (Hgb of 7.2 from 9.3 on recent discharge) accompanied by weakness.  On presentation, Anna Carr had normal, although lower, blood pressures. Hemoglobin 7.2 (9.3 on recent discharge). She received one unit of pRBC with improvement of hemoglobin to 7.9. This morning, she reports feeling stronger after blood transfusion. She has had multiple hospitalization over the last two months with decline in functional status overall.   PMT has been consulted to assist with goals of care conversation.  Clinical Assessment and Goals of Care:  We have reviewed medical records including EPIC notes, labs and imaging, assessed the patient and then had a patient and family discussion with diagnosis prognosis, GOC, EOL wishes, disposition and options.  Today, we conducted a bedside visit with the patient. She appears to be in good condition overall, though she expressed experiencing acute and ongoing pain in her right lower extremity (RLE). She is not exhibiting any significant distress. The patient reported that she was able to sleep for approximately 4 hours last night. Today she also reports new onset swallowing difficulties.  She expressed a desire to improve and get more pain control and be discharged from the hospital.  The patient has clearly expressed that she is not giving up  and desires full medical interventions as needed. However, she has stated that if she were to pass away, she wishes to do so in her home environment.  We introduced Palliative Medicine as specialized medical care for people living with serious illness. It focuses on providing relief from the symptoms and stress of a serious illness. The goal is to improve quality of life for both the patient and the family.  We attempted to elicit values and goals of care important to the patient.     Medical History Review and Family/Patient Understanding:   Patient has multiple medical conditions including depression, hypertension, hyperlipidemia, CHF, COPD, SIADH, DM II, ESRD on HD, chronic anemia, GI bleed secondary to recurrent small bowel AVMs, C. Diff colitis and colon polyps.  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 Additionally, she has peripheral artery disease (PAD) and there is concern for critical limb ischemia in her right lower extremity (RLE). The patient has a clear understanding of her current medical condition and the associated disease burden, and she is aware that medical interventions may be limited.  Social History:The patient seems to have a strong support network. She resides in [place], [place] with her son, and her daughter lives nearby. In her earlier years, she was employed by a [employer] She also enjoys gardening.   Functional and Nutritional State:The patient has observed a gradual decrease in her functional mobility, particularly due to her limb ischemia. Pain is a contributing factor to her restricted mobility. She is able to walk with the assistance of a front-wheel walker.   Palliative Symptoms: Significant pain to RLE, fatigue/weakness  and new-onset difficulty of swallowing.  Advance Directives: Patient has an advance directive in place however she wishes to remain full code  and to employ all medical interventions to prolong life.    Code Status: She is full code. We had an extensive discussion about code status and its implications during significant cardiovascular events.  Discussion:  Today, we engaged in a comprehensive discussion with the patient, her son Anna Carr, and her daughter Anna Carr. The patient demonstrates an understanding of her current medical conditions, including the severity of her right lower extremity critical limb ischemia and duodenal AVMs. She is aware of the treatment limitations, such as not being a candidate for revascularization due to her inability to tolerate dual antiplatelet therapy (DAPT), and the inability to undergo endoscopic procedures for the AVMs due to a history of bradycardic arrest. Despite these limitations including high risk for any procedures, she remains committed to exploring all other possible interventions. Additionally, she expressed her wish to remain full code in the event of major cardiovascular events. She and her family is open to discharging home with palliative in place for added layer of support.    The difference between aggressive medical intervention and comfort care was considered in light of the patient's goals of care. Hospice and Palliative Care services outpatient were explained and offered.   Both the son and daughter are aware of the patient's current treatment preferences and goals of care.Discussed the importance of continued conversation with family and the medical providers regarding overall plan of care and treatment options, ensuring decisions are within the context of the patient's values and GOCs.   Questions and concerns were addressed.  Hard Choices booklet left for review. The family was encouraged to call with questions or concerns.  PMT will continue to support holistically.   HCPOA: Anna Carr (son) [phone]    SUMMARY OF RECOMMENDATIONS    Code Status/Advance  Care Planning: Full code   Symptom Management:  Continue with holistic palliative support.  For pain management:  1.)Start high-dose tylenol . Discontinue Q8 PRN and make it Tylenol  1000mg  PO three times a day. 2.)Continue with Oxycodone  5mg  IR every 6 hours PRN for severe pain. Consider 3.)Continue with cold compress/application For new-onset swallowing difficulties, advised to observe aspiration precautions. Follow up with attending regarding interventions, may benefit from swallowing evaluation.  At discharge, recommends home and referral to community-based palliative care per patient's GOC.    Palliative Prophylaxis:  Aspiration, Bowel Regimen, Frequent Pain Assessment, and Oral Care  Additional Recommendations (Limitations, Scope, Preferences): Full Scope Treatment  Psycho-social/Spiritual:   Additional Recommendations: Education on Hospice  Prognosis:  The patient's prognosis is poor due to recurrent duodenal AVMs, critical limb ischemia, the chronic nature of her hemodialysis due to ESRD, and her high risk for procedures to mitigate complications.  Discharge Planning: To Be Determined      Primary Diagnoses: Present on Admission:  Acute blood loss anemia  CHF with left ventricular diastolic dysfunction, NYHA class 1 ([name])  Hypertension  Mitral regurgitation moderate to severe based on echocardiogram from 2022  Anemia in chronic Carr disease  Peripheral vascular disease, unspecified ([name]) critical stenosis lower extremity  Angiodysplasia of small intestine    Physical Exam HENT:     Mouth/Throat:     Mouth: Mucous membranes are moist.  Cardiovascular:     Rate and Rhythm: Normal rate.  Musculoskeletal:        General: Swelling and tenderness present.     Right lower leg: Edema  present.  Skin:    General: Skin is warm.     Comments: Scattered lesions to RLE.   Neurological:     General: No focal deficit present.     Mental Status: She is alert and  oriented to person, place, and time.     Vital Signs: BP 122/65 (BP Location: Right Arm)   Pulse 83   Temp 98 F (36.7 C) (Oral)   Resp 18   Ht 5' 1 (1.549 m)   Wt 70.3 kg   SpO2 92%   BMI 29.28 kg/m  Pain Scale: 0-10   Pain Score: 0-No pain   SpO2: SpO2: 92 % O2 Device:SpO2: 92 % O2 Flow Rate: .O2 Flow Rate (L/min): 2 L/min   Palliative Assessment/Data: 40%    Total time:  I spent 75 minutes in the care of the patient today in the above activities and documenting the encounter.  Discussed with attending team regarding treatment plan.   Kathlyne JULIANNA Tracie Mickey, NP  Palliative Medicine Team Team phone # [phone]  Thank you for allowing the Palliative Medicine Team to assist in the care of this patient. Please utilize secure chat with additional questions, if there is no response within 30 minutes please call the above phone number.  Palliative Medicine Team providers are available by phone from 7am to 7pm daily and can be reached through the team cell phone.  Should this patient require assistance outside of these hours, please call the patient's attending physician.

## 2024-05-08 NOTE — Progress Notes (Signed)
 Occupational Therapy Treatment Patient Details Name: Anna Carr MRN: [id number] DOB: 1941/07/26 Today's Date: 05/08/2024   History of present illness Anna Carr is a 83 yo female who presented with bright red blood in stool and increased weakness. Admitted for acute blood loss anemia. PMH positive for recent admissions 5/8-5/19 and 6/23-6/25 and 6/30-7/5 with GIB and symptomatic anemia, Right Lower Extremity Critical Limb Ischemia, ESRD on HD TTS, PAD, HFpEF, DM, HTN, anemia and h/o falls.   OT comments  Patient received in supine with complaints of RLE pain but willing to participate with OT. Patient asked to not perform mobility or transfers due to pain. Patient able to get to EOB with increased time and supervision. Patient able to donn socks with increased time with extra large sock for right foot to avoid pain. Patient performed standing from EOB with supervision with reaching tasks and simulated grooming tasks. Patient returned to supine and positioned for comfort with ice to RLE. Discharge recommendations continue to be appropriate. Acute OT to continue to follow to address established goals.       If plan is discharge home, recommend the following:  Assistance with cooking/housework;Assist for transportation;Help with stairs or ramp for entrance   Equipment Recommendations  None recommended by OT    Recommendations for Other Services      Precautions / Restrictions Precautions Precautions: Fall Recall of Precautions/Restrictions: Intact Precaution/Restrictions Comments: BP, HR, SpO2 Restrictions Weight Bearing Restrictions Per Provider Order: No       Mobility Bed Mobility Overal bed mobility: Needs Assistance Bed Mobility: Supine to Sit, Sit to Supine     Supine to sit: Supervision Sit to supine: Supervision   General bed mobility comments: increased time due to pain    Transfers Overall transfer level: Needs assistance Equipment used: Rolling walker (2  wheels) Transfers: Sit to/from Stand Sit to Stand: Supervision           General transfer comment: Patient performed sit to stands from EOB with supervision, declined OOB     Balance Overall balance assessment: Needs assistance Sitting-balance support: Feet supported Sitting balance-Leahy Scale: Good Sitting balance - Comments: EOB   Standing balance support: Single extremity supported, Bilateral upper extremity supported, No upper extremity supported, During functional activity Standing balance-Leahy Scale: Fair Standing balance comment: performed reaching tasks and simulated standing ADLs from EOB                           ADL either performed or assessed with clinical judgement   ADL Overall ADL's : Needs assistance/impaired     Grooming: Set up;Sitting Grooming Details (indicate cue type and reason): on EOB             Lower Body Dressing: Minimal assistance;Sitting/lateral leans Lower Body Dressing Details (indicate cue type and reason): socks               General ADL Comments: patient declining OOB due to pain but participated on EOB and simulated standing grooming tasks    Extremity/Trunk Assessment              Vision       Perception     Praxis     Communication Communication Communication: Impaired Factors Affecting Communication: Hearing impaired   Cognition Arousal: Alert Behavior During Therapy: WFL for tasks assessed/performed Cognition: No apparent impairments             OT - Cognition Comments: focused on pain  Following commands: Intact        Cueing   Cueing Techniques: Verbal cues  Exercises      Shoulder Instructions       General Comments VSS on RA    Pertinent Vitals/ Pain       Pain Assessment Pain Assessment: Faces Faces Pain Scale: Hurts whole lot Pain Location: RLE Pain Descriptors / Indicators: Discomfort, Grimacing, Guarding, Moaning Pain Intervention(s):  Limited activity within patient's tolerance, Monitored during session, Premedicated before session, Repositioned  Home Living                                          Prior Functioning/Environment              Frequency  Min 2X/week        Progress Toward Goals  OT Goals(current goals can now be found in the care plan section)  Progress towards OT goals: Progressing toward goals (slow progress due to pain)  Acute Rehab OT Goals Patient Stated Goal: less pain OT Goal Formulation: With patient/family Time For Goal Achievement: 05/19/24 Potential to Achieve Goals: Good ADL Goals Pt Will Perform Upper Body Dressing: with supervision;sitting Pt Will Perform Lower Body Dressing: with min assist;sitting/lateral leans;sit to/from stand Pt Will Transfer to Toilet: with min assist;ambulating;regular height toilet Additional ADL Goal #1: pt will complete ADLs with Mod I and LRAD Additional ADL Goal #2: Pt will complete at least 8 minutes of OOB activity to demonstrate improved tolerance for ADLs  Plan      Co-evaluation                 AM-PAC OT 6 Clicks Daily Activity     Outcome Measure   Help from another person eating meals?: None Help from another person taking care of personal grooming?: A Little Help from another person toileting, which includes using toliet, bedpan, or urinal?: A Little Help from another person bathing (including washing, rinsing, drying)?: A Little Help from another person to put on and taking off regular upper body clothing?: A Little Help from another person to put on and taking off regular lower body clothing?: A Little 6 Click Score: 19    End of Session Equipment Utilized During Treatment: Rolling walker (2 wheels)  OT Visit Diagnosis: Muscle weakness (generalized) (M62.81);Pain Pain - Right/Left: Right Pain - part of body: Leg   Activity Tolerance Patient limited by pain   Patient Left in bed;with call  bell/phone within reach;with family/visitor present   Nurse Communication Mobility status        Time: 9099-9073 OT Time Calculation (min): 26 min  Charges: OT General Charges $OT Visit: 1 Visit OT Treatments $Self Care/Home Management : 8-22 mins $Therapeutic Activity: 8-22 mins  Dick Laine, OTA Acute Rehabilitation Services  Office [phone]   Jeb LITTIE Laine 05/08/2024, 12:01 PM

## 2024-05-08 NOTE — Plan of Care (Signed)

## 2024-05-08 NOTE — Evaluation (Signed)
 Clinical/Bedside Swallow Evaluation Patient Details  Name: Anna Carr MRN: [id number] Date of Birth: 17-Jul-1941  Today's Date: 05/08/2024 Time: SLP Start Time (ACUTE ONLY): 1542 SLP Stop Time (ACUTE ONLY): 1555 SLP Time Calculation (min) (ACUTE ONLY): 13 min  Past Medical History:  Past Medical History:  Diagnosis Date   Acute on chronic anemia 02/25/2024   Acute pain of left shoulder 03/05/2024   Acute upper GI bleed 03/03/2024   Anemia due to chronic blood loss    Anemia in chronic kidney disease 01/23/2017   Anemia, chronic disease 01/23/2017   Angiodysplasia of small intestine 02/25/2024   Aortic stenosis moderate 2024 06/21/2023   Arteriovenous fistula, acquired ([name]) 11/02/2019   AV fistula ([name])    AVM (arteriovenous malformation)    AVM (arteriovenous malformation) of duodenum, acquired 03/03/2024   AVM (arteriovenous malformation) of duodenum, acquired with hemorrhage 10/30/2021   AVM (arteriovenous malformation) of small bowel, acquired with hemorrhage    C. difficile colitis 03/30/2021   Cardiac arrest ([name]) 03/02/2024   Cellulitis 03/16/2021   CHF with left ventricular diastolic dysfunction, NYHA class 1 ([name]) 02/02/2017   Cholelithiasis 03/30/2021   Chronic constipation 12/22/2015   Chronic laryngitis 02/17/2017   Last Assessment & Plan:   Formatting of this note might be different from the original.  Evaluation of larynx.  Was hospitalized for pneumonia and during that time she started noticing worsening hoarseness.  Denies any throat pain.  No obvious heartburn.  She did recently start PPI therapy.  She smoked in the past.  Denies any difficulty swallowing.  Hoarseness is improving.  EXAM shows mildly ras   Coagulation defect, unspecified ([name]) 12/07/2019   Colon polyp    Complication of vascular dialysis catheter 03/05/2020   Contact with and (suspected) exposure to covid-19 10/28/2020   COPD mixed type ([name])    Cough, unspecified 10/28/2020   COVID-19  10/29/2020   Depression    Diverticulosis of large intestine without perforation or abscess without bleeding 01/08/2020   Dyspnea on exertion    Dyspnea, unspecified 11/02/2019   Elevated troponin 03/16/2021   Encounter for removal of sutures 12/28/2019   Encounter for screening for respiratory tuberculosis 12/06/2019   ESRD (end stage renal disease) ([name]) 07/18/2019   ESRD on dialysis ([name]) 09/18/2019   ESRD on hemodialysis ([name]) 01/02/2020   Fever, unspecified 01/23/2020   GI bleed 12/19/2021   GIB (gastrointestinal bleeding) 01/02/2020   Heme positive stool    High risk medication use 12/22/2015   History of blood transfusion    History of colonic polyps 01/08/2020   HLD (hyperlipidemia) 12/22/2015   Hypertension    Hypervolemia 11/13/2019   Hypokalemia 03/11/2019   Hyponatremia 03/11/2019   Hypovolemia 10/27/2021   Hypoxia 03/11/2019   Insomnia    Insulin  long-term use ([name]) 12/22/2015   Iron  deficiency anemia, unspecified 12/06/2019   Major depressive disorder, recurrent ([name]) 12/22/2015   Malaise and fatigue 12/23/2015   Mild protein-calorie malnutrition ([name]) 03/26/2020   Mitral regurgitation moderate to severe based on echocardiogram from 2022 11/03/2021   Musculoskeletal chest pain 03/03/2024   Osteoarthritis    Osteopenia    Other disorders of phosphorus metabolism 01/08/2020   PAC (premature atrial contraction) 10/04/2020   Pain, unspecified 12/06/2019   PAT (paroxysmal atrial tachycardia) ([name]) 10/04/2020   Peripheral vascular disease, unspecified ([name]) critical stenosis lower extremity 12/01/2023   Peripheral arterial disease     Pneumonia    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   Pruritus, unspecified 12/06/2019   Pulmonary hypertension,  unspecified ([name]) 11/03/2021   Pulmonary nodule 06/09/2023   Appears very slow growing.  Chodri CT annually.     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   Symptomatic anemia 02/28/2024   Type 2 diabetes mellitus with chronic kidney disease on chronic dialysis, with long-term current use of insulin  ([name]) 12/23/2015   Type 2 diabetes mellitus, with long-term current use of insulin  ([name]) 03/11/2019   Unspecified hemorrhoids 01/08/2020   Past Surgical History:  Past Surgical History:  Procedure Laterality Date   A/V FISTULAGRAM Left 02/17/2021   Procedure: A/V FISTULAGRAM;  Surgeon: Sheree Penne Bruckner, MD;  Location: [facility] INVASIVE CV LAB;  Service: Cardiovascular;  Laterality: Left;   APPENDECTOMY     AV FISTULA PLACEMENT Left    AV FISTULA PLACEMENT Left 03/12/2021   Procedure: INSERTION OF LEFT UPPER ARM ARTERIOVENOUS (AV) GORE-TEX GRAFT;  Surgeon: Sheree Penne Bruckner, MD;  Location: [facility] OR;  Service: Vascular;  Laterality: Left;   BALLOON ENTEROSCOPY  03/02/2024   Procedure: ENTEROSCOPY, USING BALLOON;  Surgeon: San Sandor GAILS, DO;  Location: [facility] ENDOSCOPY;  Service: Gastroenterology;;   COLONOSCOPY WITH PROPOFOL  N/A 03/18/2021   Procedure: COLONOSCOPY WITH PROPOFOL ;  Surgeon: Legrand Victory LITTIE DOUGLAS, MD;  Location: [facility] ENDOSCOPY;  Service: Gastroenterology;  Laterality: N/A;   CYSTECTOMY     ENTEROSCOPY N/A 03/21/2021   Procedure: ENTEROSCOPY;  Surgeon: Legrand Victory LITTIE DOUGLAS, MD;  Location: [facility] ENDOSCOPY;  Service: Gastroenterology;  Laterality: N/A;   ENTEROSCOPY N/A 12/20/2021   Procedure: ENTEROSCOPY;  Surgeon: Legrand Victory LITTIE DOUGLAS, MD;  Location: [facility] ENDOSCOPY;  Service: Gastroenterology;  Laterality: N/A;   ENTEROSCOPY N/A 02/26/2024   Procedure: ENTEROSCOPY;  Surgeon: Albertus Gordy HERO, MD;  Location: [facility] ENDOSCOPY;  Service: Gastroenterology;  Laterality: N/A;   ENTEROSCOPY N/A 02/28/2024   Procedure: ENTEROSCOPY;  Surgeon: San Sandor GAILS, DO;  Location: [facility] ENDOSCOPY;  Service: Gastroenterology;  Laterality: N/A;   ESOPHAGOGASTRODUODENOSCOPY (EGD) WITH PROPOFOL  N/A 03/18/2021   Procedure:  ESOPHAGOGASTRODUODENOSCOPY (EGD) WITH PROPOFOL ;  Surgeon: Legrand Victory LITTIE DOUGLAS, MD;  Location: [facility] ENDOSCOPY;  Service: Gastroenterology;  Laterality: N/A;   GIVENS CAPSULE STUDY N/A 03/19/2021   Procedure: GIVENS CAPSULE STUDY;  Surgeon: Legrand Victory LITTIE DOUGLAS, MD;  Location: [facility] ENDOSCOPY;  Service: Gastroenterology;  Laterality: N/A;   GIVENS CAPSULE STUDY N/A 02/29/2024   Procedure: IMAGING PROCEDURE, GI TRACT, INTRALUMINAL, VIA CAPSULE;  Surgeon: San Sandor GAILS, DO;  Location: [facility] ENDOSCOPY;  Service: Gastroenterology;  Laterality: N/A;   HEMOSTASIS CLIP PLACEMENT  12/20/2021   Procedure: HEMOSTASIS CLIP PLACEMENT;  Surgeon: Legrand Victory LITTIE DOUGLAS, MD;  Location: [facility] ENDOSCOPY;  Service: Gastroenterology;;   HOT HEMOSTASIS N/A 03/21/2021   Procedure: HOT HEMOSTASIS (ARGON PLASMA COAGULATION/BICAP);  Surgeon: Legrand Victory LITTIE DOUGLAS, MD;  Location: [facility] ENDOSCOPY;  Service: Gastroenterology;  Laterality: N/A;   HOT HEMOSTASIS N/A 12/20/2021   Procedure: HOT HEMOSTASIS (ARGON PLASMA COAGULATION/BICAP);  Surgeon: Legrand Victory LITTIE DOUGLAS, MD;  Location: [facility] ENDOSCOPY;  Service: Gastroenterology;  Laterality: N/A;   HOT HEMOSTASIS N/A 03/02/2024   Procedure: EGD, WITH ARGON PLASMA COAGULATION;  Surgeon: San Sandor GAILS, DO;  Location: [facility] ENDOSCOPY;  Service: Gastroenterology;  Laterality: N/A;   HYSTEROSCOPY     IR THROMBECTOMY AV FISTULA W/THROMBOLYSIS/PTA INC/SHUNT/IMG LEFT Left 04/12/2024   IR US  GUIDE VASC ACCESS LEFT  04/12/2024   LOWER EXTREMITY ANGIOGRAPHY N/A 02/03/2024   Procedure: Lower Extremity Angiography;  Surgeon: Court Dorn PARAS, MD;  Location: [facility] INVASIVE  CV LAB;  Service: Cardiovascular;  Laterality: N/A;   POLYPECTOMY  03/18/2021   Procedure: POLYPECTOMY;  Surgeon: Legrand Victory LITTIE DOUGLAS, MD;  Location: [facility] ENDOSCOPY;  Service: Gastroenterology;;   HPI:  Giabella Duhart is a 83 yo female who presented with bright red blood in stool and increased weakness. Admitted for acute blood loss anemia. MBS 03/05/24  showed transient penetration of thin liquids (considered WFL) and esophageal retention of the barium tablet. PMH positive for recent admissions 5/8-5/19 and 6/23-6/25 and 6/30-7/5 with GIB and symptomatic anemia, Right Lower Extremity Critical Limb Ischemia, ESRD on HD TTS, PAD, HFpEF, DM, HTN, anemia and h/o falls    Assessment / Plan / Recommendation  Clinical Impression  Pt reports difficulty swallowing characterized by a persistent globus sensation and isolated coughing with solids. She does not recall previous MBS completed during admission in May so SLP reviewed images with pt and her daughter, which show a generally functional oropharyngeal swallow. Pt fed herself purees and solids followed by coughing and complaints of globus. Coughing did not recur with consecutive sips of thin liquids. The symptoms that pt describes are suspected to be representative of an esophageal component especially in light of esophageal retention noted on MBS 03/05/24 (may consider more dedicated assessment of the esophagus, reached out to MD). Discussed esophageal precautions and diet modification with pt, who agrees to continue regular diet and cut it as needed since she does not have her dentures. No further SLP f/u is needed, will sign off. SLP Visit Diagnosis: Dysphagia, unspecified (R13.10)    Aspiration Risk  Mild aspiration risk    Diet Recommendation Thin liquid;Regular    Liquid Administration via: Cup;Straw Medication Administration: Whole meds with puree Supervision: Patient able to self feed Compensations: Minimize environmental distractions;Slow rate;Small sips/bites Postural Changes: Seated upright at 90 degrees;Remain upright for at least 30 minutes after po intake    Other  Recommendations Recommended Consults: Consider esophageal assessment Oral Care Recommendations: Oral care BID     Assistance Recommended at Discharge    Functional Status Assessment Patient has not had a recent decline in  their functional status  Frequency and Duration            Prognosis Prognosis for improved oropharyngeal function: Good Barriers to Reach Goals: Cognitive deficits      Swallow Study   General HPI: Guelda Batson is a 83 yo female who presented with bright red blood in stool and increased weakness. Admitted for acute blood loss anemia. MBS 03/05/24 showed transient penetration of thin liquids (considered WFL) and esophageal retention of the barium tablet. PMH positive for recent admissions 5/8-5/19 and 6/23-6/25 and 6/30-7/5 with GIB and symptomatic anemia, Right Lower Extremity Critical Limb Ischemia, ESRD on HD TTS, PAD, HFpEF, DM, HTN, anemia and h/o falls Type of Study: Bedside Swallow Evaluation Previous Swallow Assessment: see HPI Diet Prior to this Study: Regular;Thin liquids (Level 0) Temperature Spikes Noted: No Respiratory Status: Nasal cannula History of Recent Intubation: No Behavior/Cognition: Alert;Cooperative Oral Cavity Assessment: Within Functional Limits Oral Care Completed by SLP: No Oral Cavity - Dentition: Poor condition;Missing dentition;Dentures, not available Vision: Functional for self-feeding Self-Feeding Abilities: Able to feed self Patient Positioning: Upright in bed Baseline Vocal Quality: Normal Volitional Cough: Strong Volitional Swallow: Able to elicit    Oral/Motor/Sensory Function Overall Oral Motor/Sensory Function: Within functional limits   Ice Chips Ice chips: Not tested   Thin Liquid Thin Liquid: Within functional limits Presentation: Straw;Self Fed    Nectar Thick Nectar Thick Liquid:  Not tested   Honey Thick Honey Thick Liquid: Not tested   Puree Puree: Within functional limits Presentation: Spoon;Self Fed   Solid     Solid: Impaired Presentation: Self Fed Pharyngeal Phase Impairments: Cough - Immediate      Anna Carr, M.A., CCC-SLP Speech Language Pathology, Acute Rehabilitation Services  Secure Chat  preferred [phone]  05/08/2024,4:05 PM

## 2024-05-08 NOTE — Progress Notes (Addendum)
 Progress Note    05/08/2024 6:57 AM Hospital Day 4  Subjective:  wants to go home.  Says she is having trouble swallowing. Having pain in right foot.   Afebrile   Vitals:   05/07/24 1642 05/07/24 2026  BP: 116/71 131/60  Pulse: 74 73  Resp:  17  Temp: (!) 97.5 F (36.4 C) (!) 97.5 F (36.4 C)  SpO2: 94% 91%    Physical Exam: General:  sitting in chair in no distress Lungs:  non labored   CBC    Component Value Date/Time   WBC 10.0 05/08/2024 0529   RBC 3.09 (L) 05/08/2024 0529   HGB 9.8 (L) 05/08/2024 0529   HGB 11.3 01/12/2024 1259   HCT 31.2 (L) 05/08/2024 0529   HCT 35.0 01/12/2024 1259   PLT 190 05/08/2024 0529   PLT 208 01/12/2024 1259   MCV 101.0 (H) 05/08/2024 0529   MCV 99 (H) 01/12/2024 1259   MCH 31.7 05/08/2024 0529   MCHC 31.4 05/08/2024 0529   RDW 19.9 (H) 05/08/2024 0529   RDW 15.7 (H) 01/12/2024 1259   LYMPHSABS 1.2 05/05/2024 0435   LYMPHSABS 0.9 01/12/2024 1259   MONOABS 0.7 05/05/2024 0435   EOSABS 0.0 05/05/2024 0435   EOSABS 0.1 01/12/2024 1259   BASOSABS 0.1 05/05/2024 0435   BASOSABS 0.0 01/12/2024 1259    BMET    Component Value Date/Time   NA 128 (L) 05/08/2024 0529   NA 134 01/12/2024 1259   K 5.1 05/08/2024 0529   CL 90 (L) 05/08/2024 0529   CO2 21 (L) 05/08/2024 0529   GLUCOSE 116 (H) 05/08/2024 0529   BUN 38 (H) 05/08/2024 0529   BUN 31 (H) 01/12/2024 1259   CREATININE 5.43 (H) 05/08/2024 0529   CALCIUM  8.9 05/08/2024 0529   GFRNONAA 7 (L) 05/08/2024 0529    INR    Component Value Date/Time   INR 1.5 (H) 05/05/2024 1606     Intake/Output Summary (Last 24 hours) at 05/08/2024 0657 Last data filed at 05/08/2024 0200 Gross per 24 hour  Intake 720 ml  Output 0 ml  Net 720 ml     Assessment/Plan:  83 y.o. 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  If pt is not able to tolerate DAPT, would not be a candidate for angiogram with intervention.  Dr. Lanis will be by to see pt later and provide further recommendations. -of note, pt having trouble swallowing-care per primary team.    Lucie Apt, PA-C [facility] [phone] 05/08/2024 6:57 AM  VASCULAR STAFF ADDENDUM: I have independently interviewed and examined the patient. I agree with the above.  Patient was seen and examined this afternoon.  Specifically, we discussed recent failure of antiplatelet with known right lower extremity critical limb ischemia.  As stated in previous notes, if unable to tolerate an antiplatelet, there is no role for vascular surgery as any endovascular or open operation would fail.  Both Doneisha and her daughter are aware of this.  In her current state, I would recommend oral antibiotics, elevation, and keeping the wounds dry. Both her and her daughter are aware that I am happy to help should they choose to proceed with above-knee amputation.  I do not think it is urgent at this time.  I asked  them to call my office whenever they feel ready.  Fonda FORBES Rim MD [facility] Phone Number: [phone] 05/08/2024 5:08 PM

## 2024-05-08 NOTE — Progress Notes (Addendum)
 [facility] Progress Note   Subjective:  Seen in room. C/o severe R leg pain, hurts to stand. C/o dysphagia to solid foods, liquid ok. No CP/dyspnea. She's frustrated - says I want to go home. I'd rather die there. Looks like palliative care has already been consulted.  Objective Vitals:   05/07/24 0711 05/07/24 1642 05/07/24 2026 05/08/24 0714  BP: (!) 90/37 116/71 131/60 122/65  Pulse: 84 74 73 83  Resp:   17 18  Temp: 98 F (36.7 C) (!) 97.5 F (36.4 C) (!) 97.5 F (36.4 C) 98 F (36.7 C)  TempSrc: Oral Oral Oral Oral  SpO2: 92% 94% 91% 92%  Weight:      Height:       Physical Exam General: Chronically ill appearing, NAD. Room air Heart: RRR; 2/6 murmur Lungs: CTAB  Abdomen: soft Extremities: RLE with scattered ulcers and erythema, 1+ pitting edema Dialysis Access: AVG +t/b  Additional Objective Labs: Basic Metabolic Panel: Recent Labs  Lab 05/06/24 0531 05/07/24 1245 05/08/24 0529  NA 129* 129* 128*  K 4.2 4.2 5.1  CL 86* 91* 90*  CO2 24 22 21*  GLUCOSE 110* 196* 116*  BUN 44* 36* 38*  CREATININE 4.67* 4.64* 5.43*  CALCIUM  9.2 8.7* 8.9  PHOS 5.7* 4.6 4.9*   Liver Function Tests: Recent Labs  Lab 05/04/24 1750 05/05/24 0435 05/06/24 0531 05/07/24 1245 05/08/24 0529  AST 48*  --   --   --   --   ALT 31  --   --   --   --   ALKPHOS 108  --   --   --   --   BILITOT 1.2  --   --   --   --   PROT 6.0*  --   --   --   --   ALBUMIN  3.0*   < > 2.8* 2.6* 2.6*   < > = values in this interval not displayed.   CBC: Recent Labs  Lab 05/04/24 1750 05/05/24 0435 05/05/24 1606 05/06/24 0531 05/07/24 1245 05/08/24 0529  WBC 4.9 5.6  --  7.4 8.6 10.0  NEUTROABS  --  3.6  --   --   --   --   HGB 7.2* 7.9*   < > 9.0* 9.1* 9.8*  HCT 23.0* 25.1*   < > 27.9* 28.7* 31.2*  MCV 102.7* 98.8  --  99.6 101.8* 101.0*  PLT 202 182  --  199 185 190   < > = values in this interval not displayed.   Medications:   ascorbic acid   1,000 mg Oral QHS    Chlorhexidine  Gluconate Cloth  6 each Topical Q0600   clotrimazole    Topical BID   cyanocobalamin   1,000 mcg Oral QPM   darbepoetin (ARANESP ) injection - DIALYSIS  200 mcg Subcutaneous Q Fri-1800   ferric citrate   210 mg Oral TID WC   insulin  aspart  0-9 Units Subcutaneous TID WC   leptospermum manuka honey  1 Application Topical Daily   liver oil-zinc  oxide   Topical BID   midodrine   10 mg Oral TID   pantoprazole   40 mg Oral BID   polyethylene glycol  17 g Oral Daily   rosuvastatin   10 mg Oral Daily   senna  2 tablet Oral Daily   Vitamin E   800 Units Oral q AM    Dialysis Orders TTS [facility]  3.5hr, 400/A1.5, EDW 70kg, 2K/2.5Ca bath, LUE AVG, no heparin  - Mircera 200mcg IV  q 2 weeks (ordered, last given early June) - Calcitriol  0.25mcg PO q HD   Assessment/Plan: Symptomatic anemia: Chronic GIB, known AVMs. Hx bradycardic arrest during last EGD. S/p 1U PRBCs 7/10, s/p Aranesp  200mcg on 7/11 - continue weekly. PAD/RLE ischemia: Severe RLE pain. VVS consulted. High risk for any procedure and unable to tolerate DAPT.  ESRD: Continue HD on TTS schedule - next tomorrow. Hypotension/volume: On mido TID, does have LE edema. Secondary HPTH: Ca/Phos ok - continue Auryxia  binder.  T2DM. Insulin  per primary  Dysphagia: Defer to primary. On PPI. GOC: Declining health. She is not interested in SNF. Palliative care already consulted.  Izetta Boehringer, PA-C 05/08/2024, 9:58 AM   [facility]   Seen and examined independently.  Agree with note and exam as documented above by physician extender and as noted here.  Spoke with daughter at bedside.  Patient saw pall care today.  States that she was hurting earlier  General adult female in no acute distress HEENT normocephalic atraumatic extraocular movements intact sclera anicteric Neck supple trachea midline Lungs clear to auscultation bilaterally normal work of breathing at rest  Heart S1S2 no rub Abdomen soft nontender  nondistended Extremities 1-2+ edema and erythema of right leg Psych normal mood and affect Neuro alert and conversant;provides hx and follows commands   ESRD on HD - she wishes to continue with HD.  HD per TTS schedule.    Chronic GIB and symptomatic anemia - PRBC's per primary and note arrest during last EGD per charting   Right leg ischemia - vascular consulted. Per VVS the only offered operation is an AKA of right leg.    Katheryn [name] Saba, MD 05/08/2024  5:50 PM

## 2024-05-08 NOTE — Progress Notes (Signed)
 HD#0 SUBJECTIVE:  Patient Summary: Anna Carr is a 83 y.o. person living with a history of recurrent GI bleeds with duodenal AVMs, ESRD on HD (TTS), HFpEF, PAD with RLE critical limb ischemia without revascularization option, who presents to the ED for bright red blood per rectum and admitted for acute blood loss anemia. Overnight Events: Nausea without vomiting at 4 am, was given Zofran    Interim History: Patient was sitting in bed. Daughter at bedside. Patient is ready to go home. Unfortunately she has not been getting adequate pain control from her ischemic leg. She says the pain now is extending up to her knee. Patient does report since yesterday that she has been having some trouble swallowing. Patient denies any pain with swallowing, any burning or reflux symptoms. Patient says she does not have trouble swallowing liquids but does have trouble with foods like eggs. Daughter did want this to be evaluated. No other complaints at this time.  OBJECTIVE:  Vital Signs: Vitals:   05/07/24 0711 05/07/24 1642 05/07/24 2026 05/08/24 0714  BP: (!) 90/37 116/71 131/60 122/65  Pulse: 84 74 73 83  Resp:   17 18  Temp: 98 F (36.7 C) (!) 97.5 F (36.4 C) (!) 97.5 F (36.4 C) 98 F (36.7 C)  TempSrc: Oral Oral Oral Oral  SpO2: 92% 94% 91% 92%  Weight:      Height:       Supplemental O2: Room Air SpO2: 92 % O2 Flow Rate (L/min): 2 L/min  Filed Weights   05/06/24 0828 05/06/24 1148 05/06/24 1153  Weight: 73.9 kg 70.3 kg 70.3 kg     Intake/Output Summary (Last 24 hours) at 05/08/2024 1546 Last data filed at 05/08/2024 0600 Gross per 24 hour  Intake 480 ml  Output 0 ml  Net 480 ml   Net IO Since Admission: -1,560 mL [05/08/24 1546]  Physical Exam: Physical Exam Cardiovascular:     Rate and Rhythm: Normal rate and regular rhythm.     Heart sounds: Normal heart sounds.  Pulmonary:     Effort: Pulmonary effort is normal.     Breath sounds: Normal breath sounds.  Abdominal:      General: Bowel sounds are normal.     Palpations: Abdomen is soft.     Tenderness: There is no abdominal tenderness. There is no guarding or rebound.  Musculoskeletal:     Right lower leg: Edema present.     Left lower leg: Edema present.     Comments: Patient's right foot is ruborous with punched out ulcers scattered around right foot and medial lower extremity.  Patient has pedal edema in right foot and ischemic changes in the form of early necrotic skin around DP distribution.  No TP or DP palpated. Left and Right lower extremities are cool to the touch in the anterior shin area bilaterally. Right foot and toes are cool to the touch and sensitive as well.    Neurological:     Mental Status: She is alert.     Patient Lines/Drains/Airways Status     Active Line/Drains/Airways     Name Placement date Placement time Site Days   Peripheral IV 05/04/24 22 G 1.75 Right;Anterior Forearm 05/04/24  1825  Forearm  1   Peripheral IV 05/04/24 22 G Posterior;Right Forearm 05/04/24  2240  Forearm  1   Fistula / Graft Left Upper arm Arteriovenous vein graft 03/12/21  1254  Upper arm  1150   Wound / Incision (Open or Dehisced) 02/26/24  Other (Comment) Knee Right;Left;Anterior bilateral knee abrasion 02/26/24  1900  Knee  69   Wound 04/10/24 Other (Comment) Leg Right 04/10/24  --  Leg  25   Wound 04/12/24 0534 Other (Comment) Arm Lower;Posterior;Proximal;Right 04/12/24  0534  Arm  23   Wound 04/12/24 0536 Other (Comment) Thigh Posterior;Proximal;Right 04/12/24  0536  Thigh  23   Wound 04/12/24 0539 Other (Comment) Pretibial Distal;Right 04/12/24  0539  Pretibial  23   Wound 04/12/24 0541 Pressure Injury Heel Posterior;Right Stage 2 -  Partial thickness loss of dermis presenting as a shallow open injury with a red, pink wound bed without slough. 04/12/24  0541  Heel  23   Wound 04/12/24 0542 Pressure Injury Heel Left;Posterior Stage 1 -  Intact skin with non-blanchable redness of a localized area  usually over a bony prominence. 04/12/24  0542  Heel  23   Wound 04/12/24 0543 Other (Comment) Toe (Comment  which one) Anterior;Right 04/12/24  0543  Toe (Comment  which one)  23   Wound 04/12/24 0544 Other (Comment) Toe (Comment  which one) Anterior;Right 04/12/24  0544  Toe (Comment  which one)  23   Wound 04/12/24 1322 Other (Comment) Arm Anterior;Left;Lower 04/12/24  1322  Arm  23   Wound 05/05/24 0930 Foot Anterior;Right 05/05/24  0930  Foot  less than 1            Pertinent labs and imaging:      Latest Ref Rng & Units 05/08/2024    5:29 AM 05/07/2024   12:45 PM 05/06/2024    5:31 AM  CBC  WBC 4.0 - 10.5 K/uL 10.0  8.6  7.4   Hemoglobin 12.0 - 15.0 g/dL 9.8  9.1  9.0   Hematocrit 36.0 - 46.0 % 31.2  28.7  27.9   Platelets 150 - 400 K/uL 190  185  199        Latest Ref Rng & Units 05/08/2024    5:29 AM 05/07/2024   12:45 PM 05/06/2024    5:31 AM  CMP  Glucose 70 - 99 mg/dL 883  803  889   BUN 8 - 23 mg/dL 38  36  44   Creatinine 0.44 - 1.00 mg/dL 4.56  5.35  5.32   Sodium 135 - 145 mmol/L 128  129  129   Potassium 3.5 - 5.1 mmol/L 5.1  4.2  4.2   Chloride 98 - 111 mmol/L 90  91  86   CO2 22 - 32 mmol/L 21  22  24    Calcium  8.9 - 10.3 mg/dL 8.9  8.7  9.2     No results found.  ASSESSMENT/PLAN:  Assessment: Principal Problem:   Acute blood loss anemia Active Problems:   Anemia in chronic kidney disease   CHF with left ventricular diastolic dysfunction, NYHA class 1 ([name])   Type 2 diabetes mellitus with chronic kidney disease on chronic dialysis, with long-term current use of insulin  ([name])   ESRD on hemodialysis ([name])   Hypertension   Mitral regurgitation moderate to severe based on echocardiogram from 2022   Peripheral vascular disease, unspecified ([name]) critical stenosis lower extremity   Angiodysplasia of small intestine  Anna Carr is a 83 y.o. person living with a history of recurrent GI bleeds with duodenal AVMs, ESRD on HD (TTS), HFpEF, PAD with  RLE critical limb ischemia without revascularization option, who presents to the ED for bright red blood per rectum and admitted for acute blood loss anemia  on 7/10.   Plan: Acute Blood Loss Anemia Hematochezia Duodenal AVMs Patient has a history of GI bleed complicated by duodenal AVMs, which has not been treated due to bradycardic arrest during EGD. At discharge on 7/5, Hgb was 9.3 and on this admission was 7.2 Endorsed episode of melena and bright red blood.Patient has received 1u PRBC. At previous admission family was counseled on the fact that AVMs would likely create slow continuous or recurrent bleed. This patient will not be candidate for endoscopic interventions. Patient was supposed to be evaluated for revascularizing outpatient but was not able to tolerate DAPT. Vascular re-evaluating today but options limited with DAPT not tolerated.  - GI recommends  care with a hematologist outpatient for transfusions of IV iron  and PRBCs.Also suggests that patient could consider LAR octreotide - Iron  studies from previous admission showed iron  as 73, Sat Ratio of 26 and ferritin 1,188.Anemia likely secondary to Anemia of chronic disease and chronic kidney disease.  - Hgb is 9.1. Holding stable  - Monitor CBC -Transfuse if Hgb <7 or in setting of acute blood loss  - Page for any acute hemodynamic changes, hematochezia, hematemesis, or large-volume melena   Right Lower Extremity Critical Limb Ischemia PAD Patient had presented to [facility] ED on prior admission for weakness and was found to have right critical limb ischemia. Dr. Lanis with vascular surgery was seeing patient at last hospitalization. Was recommended for patient to start DAPT, which patient tolerated well while in the hospital. Patient is at high risk for complications with revascularization and does not have amenable option for vein graft. Patient finished antibiotic course for suspected cellulitis of right foot, she reports.   - wound  care -Vascular surgery will reevaluate tomorrow. Patient has been told that if she is not able to tolerate DAPT, then she will not be a revascularization candidate.  -Holding DAPT due to GI bleed  -tylenol  and oxycodone  5 mg q6h PRN  GOC - Conversation was had with patient's daughter 7/12 about patient not being able to tolerate DAPT, having history of GI bleeds and no further endoscopic intervention per GI. Was also discussed with daughter that patient is high risk for procedures. Daughter was understanding of the situation and when offered to have palliative consult, daughter agreed. Patient is reporting today that she is frustrated and that she wants to go home. Patient says that she wants to die at home, but is not ready to die yet. Unfortunate situation considering acute blood loss anemia and inability to tolerate DAPT  -palliative consult   Swallow Function Patient denies any pain with swallowing. Patient endorses trouble swallowing foods like eggs but not liquids. RN notified me that patient was having some coughing while trying to swallow. Patient denies any reflux like symptoms  - Pt had MBS with SLP in May 2025. Showed esophageal retention of barium tablet at that time but minimal penetration of thin liquids  - SLP recommends esophagram but patient would not be a candidate for any endoscopic interventions.  - Patient is on protonix  40 mg BID   Hypertension Patient's blood pressure has tendency to run on the hypotensive side, although they have been running normotensive during this admission with occasional lows.   -Will continue to monitor  - Hold home carvedilol , especially in the setting of patient on HD  -Patient is midodrine  10 mg TID   ESRD on HD TTS Patient will have HD tomorrow. Will continue HD TTS schedule.  - Monitor CBC and Renal Function Panel -  Nephrology consulted and aware of patient  HFpEF 50-55% Patient has some volume overload with lower extremity edema but no  dyspnea. Dialysis due tomorrow  Echocardiogram on 04/10/2024 showed LVEF of 50-55% with LVH. Bilateral atria dilated. Moderate mitral and triscuspid regurgitation. Calcification of aortic valve. - Can consider discontinuing lasix  as patient does not have any urine output due to history of ESRD  - Monitor I&Os  Type 2 Diabetes Home medication includes Soliqua  6u each morning. Last A1c 5.8 on 03/15/2024. - Sensitive SSI while on clear liquid diet  HLD  -rosuvastatin  10 mg   Best Practice: Diet: Clear liquid diet IVF: Fluids:  VTE: held for bleeding, SCDs c/i with severe PAD Code: Full  Disposition planning: Therapy Recs:  Family Contact: to be notified. DISPO: Anticipated discharge pending clinical improvement  Signature:  Tallulah Hosman [facility]  3:46 PM, 05/08/2024  On Call pager [phone]

## 2024-05-09 ENCOUNTER — Other Ambulatory Visit (HOSPITAL_COMMUNITY): Payer: Self-pay

## 2024-05-09 ENCOUNTER — Telehealth (HOSPITAL_COMMUNITY): Payer: Self-pay | Admitting: Pharmacy Technician

## 2024-05-09 DIAGNOSIS — D62 Acute posthemorrhagic anemia: Secondary | ICD-10-CM | POA: Diagnosis not present

## 2024-05-09 DIAGNOSIS — N186 End stage renal disease: Secondary | ICD-10-CM | POA: Diagnosis not present

## 2024-05-09 DIAGNOSIS — I739 Peripheral vascular disease, unspecified: Secondary | ICD-10-CM | POA: Diagnosis not present

## 2024-05-09 DIAGNOSIS — I70233 Atherosclerosis of native arteries of right leg with ulceration of ankle: Secondary | ICD-10-CM | POA: Diagnosis not present

## 2024-05-09 DIAGNOSIS — K921 Melena: Secondary | ICD-10-CM | POA: Diagnosis not present

## 2024-05-09 DIAGNOSIS — K31819 Angiodysplasia of stomach and duodenum without bleeding: Secondary | ICD-10-CM | POA: Diagnosis not present

## 2024-05-09 DIAGNOSIS — K5521 Angiodysplasia of colon with hemorrhage: Secondary | ICD-10-CM | POA: Diagnosis not present

## 2024-05-09 DIAGNOSIS — Z515 Encounter for palliative care: Secondary | ICD-10-CM | POA: Diagnosis not present

## 2024-05-09 DIAGNOSIS — R531 Weakness: Secondary | ICD-10-CM | POA: Diagnosis not present

## 2024-05-09 LAB — RENAL FUNCTION PANEL
Albumin: 2.6 g/dL — ABNORMAL LOW (ref 3.5–5.0)
Anion gap: 20 — ABNORMAL HIGH (ref 5–15)
BUN: 49 mg/dL — ABNORMAL HIGH (ref 8–23)
CO2: 18 mmol/L — ABNORMAL LOW (ref 22–32)
Calcium: 9 mg/dL (ref 8.9–10.3)
Chloride: 87 mmol/L — ABNORMAL LOW (ref 98–111)
Creatinine, Ser: 6.31 mg/dL — ABNORMAL HIGH (ref 0.44–1.00)
GFR, Estimated: 6 mL/min — ABNORMAL LOW (ref >60)
Glucose, Bld: 171 mg/dL — ABNORMAL HIGH (ref 70–99)
Phosphorus: 5.1 mg/dL — ABNORMAL HIGH (ref 2.5–4.6)
Potassium: 5.1 mmol/L (ref 3.5–5.1)
Sodium: 125 mmol/L — ABNORMAL LOW (ref 135–145)

## 2024-05-09 LAB — CBC
HCT: 32.1 % — ABNORMAL LOW (ref 36.0–46.0)
Hemoglobin: 9.9 g/dL — ABNORMAL LOW (ref 12.0–15.0)
MCH: 32 pg (ref 26.0–34.0)
MCHC: 30.8 g/dL (ref 30.0–36.0)
MCV: 103.9 fL — ABNORMAL HIGH (ref 80.0–100.0)
Platelets: 162 K/uL (ref 150–400)
RBC: 3.09 MIL/uL — ABNORMAL LOW (ref 3.87–5.11)
RDW: 20 % — ABNORMAL HIGH (ref 11.5–15.5)
WBC: 9.4 K/uL (ref 4.0–10.5)
nRBC: 0.3 % — ABNORMAL HIGH (ref 0.0–0.2)

## 2024-05-09 LAB — HEPATITIS B SURFACE ANTIBODY, QUANTITATIVE: Hep B S AB Quant (Post): <3.5 m[IU]/mL — ABNORMAL LOW

## 2024-05-09 LAB — GLUCOSE, CAPILLARY
Glucose-Capillary: 172 mg/dL — ABNORMAL HIGH (ref 70–99)
Glucose-Capillary: 110 mg/dL — ABNORMAL HIGH (ref 70–99)

## 2024-05-09 MED ORDER — CEPHALEXIN 500 MG PO CAPS
500.0000 mg | ORAL_CAPSULE | Freq: Two times a day (BID) | ORAL | 0 refills | Status: AC
  Filled 2024-05-09: qty 8, 4d supply, fill #0

## 2024-05-09 MED ORDER — POLYETHYLENE GLYCOL 3350 17 GM/SCOOP PO POWD
17.0000 g | Freq: Every day | ORAL | 0 refills | Status: DC
  Filled 2024-05-09: qty 238, 14d supply, fill #0

## 2024-05-09 MED ORDER — ALBUMIN HUMAN 25 % IV SOLN: 25.0000 g | Freq: Once | INTRAVENOUS | Status: DC | PRN

## 2024-05-09 MED ORDER — LIDOCAINE HCL (PF) 1 % IJ SOLN: 5.0000 mL | INTRAMUSCULAR | Status: DC | PRN

## 2024-05-09 MED ORDER — LIDOCAINE-PRILOCAINE 2.5-2.5 % EX CREA: 1.0000 | TOPICAL_CREAM | CUTANEOUS | Status: DC | PRN

## 2024-05-09 MED ORDER — ANTICOAGULANT SODIUM CITRATE 4% (200MG/5ML) IV SOLN: 5.0000 mL | Status: DC | PRN

## 2024-05-09 MED ORDER — ALBUMIN HUMAN 25 % IV SOLN
INTRAVENOUS | Status: AC
  Filled 2024-05-09: qty 100

## 2024-05-09 MED ORDER — PENTAFLUOROPROP-TETRAFLUOROETH EX AERO: 1.0000 | INHALATION_SPRAY | CUTANEOUS | Status: DC | PRN

## 2024-05-09 MED ORDER — HEPARIN SODIUM (PORCINE) 1000 UNIT/ML DIALYSIS
1000.0000 [IU] | INTRAMUSCULAR | Status: DC | PRN
Start: 2024-05-09 — End: 2024-05-09

## 2024-05-09 MED ORDER — ACETAMINOPHEN 500 MG PO TABS: 1000.0000 mg | ORAL_TABLET | Freq: Three times a day (TID) | ORAL | Status: DC | PRN

## 2024-05-09 MED ORDER — MIDODRINE HCL 5 MG PO TABS
ORAL_TABLET | ORAL | Status: AC
  Filled 2024-05-09: qty 2

## 2024-05-09 MED ORDER — OXYCODONE HCL 5 MG PO TABS
5.0000 mg | ORAL_TABLET | Freq: Three times a day (TID) | ORAL | 0 refills | Status: AC | PRN
  Filled 2024-05-09: qty 15, 5d supply, fill #0

## 2024-05-09 MED ORDER — SENNA 8.6 MG PO TABS
1.0000 | ORAL_TABLET | Freq: Every day | ORAL | 0 refills | Status: DC
  Filled 2024-05-09: qty 30, 30d supply, fill #0

## 2024-05-09 MED ORDER — ALTEPLASE 2 MG IJ SOLR: 2.0000 mg | Freq: Once | INTRAMUSCULAR | Status: DC | PRN

## 2024-05-09 NOTE — Progress Notes (Signed)
 PT Cancellation Note  Patient Details Name: Anna Carr MRN: [id number] DOB: July 31, 1941   Cancelled Treatment:    Reason Eval/Treat Not Completed: Patient at procedure or test/unavailable  Currently in HD;  Will follow up later today as time allows;  Otherwise, will follow up for PT tomorrow;   Thank you,  Silvano Currier, PT  Acute Rehabilitation Services Office [phone]    Silvano VEAR Currier 05/09/2024, 8:11 AM

## 2024-05-09 NOTE — Progress Notes (Signed)
 Daily Progress Note   Patient Name: Anna Carr       Date: 05/09/2024 DOB: 1941/09/16  Age: 83 y.o. MRN#: [id number] Attending Physician: Trudy Mliss Dragon, MD Primary Care Physician: Thurmond Cathlyn LABOR., MD Admit Date: 05/04/2024  Reason for Consultation/Follow-up: Establishing goals of care  Subjective:  I visited the patient at her bedside today. She is generally doing well and is not experiencing any distress. She had just returned from a hemodialysis session and was preparing for lunch. She reported that the pain in her right foot persists, but she is tolerating it better than in previous days. She also feels that the swelling  to her right foot has improved. She expressed excitement about the possibility of being discharged today and inquired about outpatient palliative services, believing she could benefit from them.   Length of Stay: 0  Current Medications: Scheduled Meds:   acetaminophen   1,000 mg Oral TID   ascorbic acid   1,000 mg Oral QHS   cephALEXin   500 mg Oral BID   Chlorhexidine  Gluconate Cloth  6 each Topical Q0600   clotrimazole    Topical BID   cyanocobalamin   1,000 mcg Oral QPM   darbepoetin (ARANESP ) injection - DIALYSIS  200 mcg Subcutaneous Q Fri-1800   ferric citrate   210 mg Oral TID WC   insulin  aspart  0-9 Units Subcutaneous TID WC   leptospermum manuka honey  1 Application Topical Daily   liver oil-zinc  oxide   Topical BID   midodrine   10 mg Oral TID   pantoprazole   40 mg Oral BID   polyethylene glycol  17 g Oral Daily   rosuvastatin   10 mg Oral Daily   senna  2 tablet Oral Daily   Vitamin E   800 Units Oral q AM    Continuous Infusions:  albumin  human      PRN Meds: albumin  human, artificial tears, camphor-menthol , fluticasone , Muscle Rub,  ondansetron , mouth rinse, oxyCODONE   Physical Exam Constitutional:      Appearance: Normal appearance.  Cardiovascular:     Rate and Rhythm: Normal rate.  Pulmonary:     Effort: Pulmonary effort is normal.  Musculoskeletal:        General: Swelling present.     Right lower leg: Edema present.     Left lower leg: Edema present.  Skin:  General: Skin is warm.     Comments: Noted for ulcers scattered around right foot and medial lower extremity. Pedal edema in right foot with early necrotic skin around DP distribution.  Neurological:     General: No focal deficit present.     Mental Status: She is alert and oriented to person, place, and time. Mental status is at baseline.             Vital Signs: BP (!) 116/55 (BP Location: Right Arm)   Pulse 73   Temp 97.6 F (36.4 C) (Oral)   Resp 18   Ht 5' 1 (1.549 m)   Wt 68.2 kg   SpO2 100%   BMI 28.41 kg/m  SpO2: SpO2: 100 % O2 Device: O2 Device: Room Air O2 Flow Rate: O2 Flow Rate (L/min): 2 L/min  Intake/output summary:  Intake/Output Summary (Last 24 hours) at 05/09/2024 1403 Last data filed at 05/09/2024 1159 Gross per 24 hour  Intake 630 ml  Output 3000 ml  Net -2370 ml   LBM: Last BM Date : 05/07/24 Baseline Weight: Weight: 69.4 kg Most recent weight: Weight: 68.2 kg       Palliative Assessment/Data: 40%      Patient Active Problem List   Diagnosis Date Noted   Acute blood loss anemia 05/04/2024   Critical limb ischemia of right lower extremity ([name]) 04/24/2024   Upper GI bleed 04/18/2024   Hematochezia 04/18/2024   ESRD (end stage renal disease) on dialysis ([name]) 04/18/2024   Pressure injury of skin 04/12/2024   Shock, unspecified ([name]) 04/10/2024   AVM (arteriovenous malformation)    History of blood transfusion    Community acquired pneumonia    Acute pain of left shoulder 03/05/2024   Musculoskeletal chest pain 03/03/2024   AVM (arteriovenous malformation) of duodenum, acquired 03/03/2024   Acute  upper GI bleed 03/03/2024   Cardiac arrest ([name]) 03/02/2024   Symptomatic anemia 02/28/2024   Acute on chronic anemia 02/25/2024   Angiodysplasia of small intestine 02/25/2024   Peripheral vascular disease, unspecified ([name]) critical stenosis lower extremity 12/01/2023   Aortic stenosis moderate 2024 06/21/2023   Pulmonary nodule 06/09/2023   AVM (arteriovenous malformation) of small bowel, acquired with hemorrhage    Anemia due to chronic blood loss    GI bleed 12/19/2021   Mitral regurgitation moderate to severe based on echocardiogram from 2022 11/03/2021   Pulmonary hypertension, unspecified ([name]) 11/03/2021   AVM (arteriovenous malformation) of duodenum, acquired with hemorrhage 10/30/2021   Cholelithiasis 03/30/2021   Heme positive stool    Elevated troponin 03/16/2021   Cellulitis 03/16/2021   COVID-19 10/29/2020   Contact with and (suspected) exposure to covid-19 10/28/2020   Cough, unspecified 10/28/2020   PAT (paroxysmal atrial tachycardia) ([name]) 10/04/2020   AV fistula ([name])    COPD mixed type ([name])    Depression    Dyspnea on exertion    Hypertension    Osteoarthritis    Moderate protein-calorie malnutrition ([name]) 03/26/2020   Complication of vascular dialysis catheter 03/05/2020   Fever, unspecified 01/23/2020   Diverticulosis of large intestine without perforation or abscess without bleeding 01/08/2020   History of colonic polyps 01/08/2020   Other disorders of phosphorus metabolism 01/08/2020   Unspecified hemorrhoids 01/08/2020   ESRD on hemodialysis ([name]) 01/02/2020   GIB (gastrointestinal bleeding) 01/02/2020   Encounter for removal of sutures 12/28/2019   Coagulation defect, unspecified ([name]) 12/07/2019   Encounter for screening for respiratory tuberculosis 12/06/2019   Iron  deficiency anemia, unspecified 12/06/2019  Pruritus, unspecified 12/06/2019   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/2019   Dyspnea, unspecified 11/02/2019   ESRD  on dialysis ([name]) 09/18/2019   ESRD (end stage renal disease) ([name]) 07/18/2019   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   Hyponatremia 03/11/2019   Hypoxia 03/11/2019   Hypokalemia 03/11/2019   Type 2 diabetes mellitus, with long-term current use of insulin  ([name]) 03/11/2019   Chronic laryngitis 02/17/2017   CHF with left ventricular diastolic dysfunction, NYHA class 1 ([name]) 02/02/2017   Anemia in chronic kidney disease 01/23/2017   Anemia, chronic disease 01/23/2017   Type 2 diabetes mellitus with chronic kidney disease on chronic dialysis, with long-term current use of insulin  ([name]) 12/23/2015   Chronic constipation 12/22/2015   Colon polyp 12/22/2015   High risk medication use 12/22/2015   HLD (hyperlipidemia) 12/22/2015   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   Osteopenia 12/22/2015   Insulin  long-term use ([name]) 12/22/2015   Insomnia 12/22/2015   Major depressive disorder, recurrent ([name]) 12/22/2015    Palliative Care Assessment & Plan   Patient Profile: Anna Carr is a  83 y.o. female  with past medical history of  of depression, hypertension, hyperlipidemia, CHF, COPD, SIADH, DM II, ESRD on HD, chronic anemia, GI bleed secondary to small bowel AVMs, C. Diff colitis and colon polyps.  admitted on 05/04/2024 with PAD with concern for RLE critical lim ischemia. She also presented for hematochezia (Hgb of 7.2 from 9.3 on recent discharge) accompanied by weakness.   On presentation, Ms. Nebergall had normal, although lower, blood pressures. Hemoglobin 7.2 (9.3 on recent discharge). She received one unit of pRBC with improvement of hemoglobin to 7.9. This morning, she reports feeling stronger after blood transfusion. She has had multiple hospitalization over the last two months with decline in functional status overall.   Assessment: Chart reviewed inclusive of vital signs, progress notes, laboratory results, and diagnostic images.   I visited the patient at  her bedside today, daughter was also present. Overall, she is doing well. She had just returned from a hemodialysis session and was preparing to have lunch. She mentioned that the pain in her right foot is still there, but she is tolerating it better than in previous days. She also feels that the swelling has improved. She expressed excitement about the possibility of being discharged today. Additionally, she inquired about outpatient palliative services, believing she could benefit from them.   Created space and opportunity for patient to explore thoughts feelings and fears regarding current medical situation.  Discussed the importance of continued conversation with family and the medical providers regarding overall plan of care and treatment options, ensuring decisions are within the context of the patient's values and GOCs.    Recommendations/Plan: Code Status: Full Code Continue with holistic palliative support.  For pain management:  1.)Continue with high-dose tylenol . Discontinue Q8 PRN and make it Tylenol  1000mg  PO three times a day. 2.)Continue with Oxycodone  5mg  IR every 6 hours PRN for severe pain. Consider 3.)Continue with cold compress/application For new-onset swallowing difficulties, advised to observe aspiration precautions. Treatment per attending.  At discharge, appreciate's TOC help to set-up referral for outpatient palliative services per family's request. TOC order placed.   Goals of Care and Additional Recommendations: Limitations on Scope of Treatment: Full Scope Treatment  Code Status:    Code Status Orders  (From admission, onward)           Start     Ordered   05/04/24 2148  Full code  Continuous       Question:  By:  Answer:  Consent: discussion documented in EHR   05/04/24 2147           Code Status History     Date Active Date Inactive Code Status Order ID Comments User Context   04/24/2024 2047 04/29/2024 1910 Full Code [id number]  Jolaine Pac,  DO Inpatient   04/17/2024 2336 04/19/2024 2114 Full Code [id number]  Marylu Gee, DO ED   04/10/2024 1419 04/14/2024 2320 Full Code [id number]  Fernand Prost, MD ED   03/02/2024 1136 03/12/2024 0227 Full Code [id number]  Minor, Elsie RAMAN, NP Inpatient   02/25/2024 1332 03/02/2024 1136 Full Code [id number]  Jimmy Anna SAILOR, DO ED   02/03/2024 0944 02/03/2024 1815 Full Code [id number]  Court Dorn PARAS, MD Inpatient   12/19/2021 1929 12/21/2021 1812 Full Code [id number]  Seena Marsa NOVAK, MD ED   03/30/2021 0901 04/01/2021 2304 Full Code [id number]  Barbarann Nest, MD ED   03/15/2021 2253 03/23/2021 2210 Full Code [id number]  Ricky Alfrieda DASEN, DO Inpatient      Advance Directive Documentation    Flowsheet Row Most Recent Value  Type of Advance Directive Healthcare Power of Attorney  Pre-existing out of facility DNR order (yellow form or pink MOST form) --  MOST Form in Place? --    Prognosis:  The patient's outlook is unfavorable because of recurrent duodenal arteriovenous malformations (AVMs), critical limb ischemia, the ongoing need for hemodialysis due to end-stage renal disease (ESRD), and her elevated risk associated with procedures aimed at reducing complications.  Discharge Planning: Home with Palliative Services  Care plan was discussed with patient, patient's daughter, RN and the medical team.  Total time: I spent 35 minutes in the care of the patient today in the above activities and documenting the encounter.  Thank you for allowing the Palliative Medicine Team to assist in the care of this patient.   Kathlyne JULIANNA Tracie Mickey, NP  Please contact Palliative Medicine Team phone at [phone] for questions and concerns.

## 2024-05-09 NOTE — Discharge Planning (Signed)
 [facility] Patient Dialysis Discharge Orders - [facility] CLINIC: [place]  Patient's name: KAELEIGH WESTENDORF Admit/DC Dates: 05/04/2024 - 05/09/24  DISCHARGE DIAGNOSES: Symptomatic anemia -> chronic GIB/gastric AVMs  R critical limb ischemia -> no options for revascularization R foot/leg ulcerations with cellulitis -> s/p abx course Dysphagia -> not a candidate for repeat EGD due to prior cardiac arrest  HD ORDER CHANGES: Heparin  change: no EDW Change: YES New EDW: 68 kg Bath Change: no  ANEMIA MANAGEMENT: Aranesp : Given: YES   Amount/Date of last dose: 200mcg on 7/11 ESA dose for discharge: mircera 225 mcg IV q 2 weeks, to start on 05/12/24 IV Iron  dose at discharge: per protocol Transfusion: Given: YES - 1U PRBCs on 7/10  BONE/MINERAL MEDICATIONS: Hectorol/Calcitriol  change: no Sensipar/Parsabiv change: no  ACCESS INTERVENTION/CHANGE: no Details:  RECENT LABS: Recent Labs  Lab 05/09/24 0536  HGB 9.9*  NA 125*  K 5.1  CALCIUM  9.0  PHOS 5.1*  ALBUMIN  2.6*   IV ANTIBIOTICS: no Details:  OTHER ANTICOAGULATION: On Coumadin?: no  OTHER/APPTS/LAB ORDERS:  - VVS did not pursue LE angiography because she would not be able to tolerate DAPT due to GIBs  - RLE wounds are high risk for infection, she is high risk for amputation  - Palliative care consulted - she wants to continue full scope of care at this time. Keep conversations going - at least continue to discuss moving to DNR.  D/C Meds to be reconciled by nurse after every discharge.  Completed By: Izetta Boehringer, PA-C [facility] Pager [phone]   Reviewed by: MD:______ RN_______

## 2024-05-09 NOTE — Discharge Instructions (Addendum)
 You were hospitalized because you had some bleeding from your digestive tract. We were able to give you some blood while you were in the hospital to help stabilize your blood levels. Your blood levels did stay stable during this hospitalizations and you did not require any more transfusions. We also made sure that you were able to dialysis on your normal schedule. We gave you some pain medications to help manage the pain in your foot. We also had the physical therapists work with you to try to help you get your strength back. Thank you for allowing us  to be part of your care.   You should call to make an appointment to follow up with your primary care provider Dr.Grisso.   You should also go to your appointment with the Vascular Surgeons on August 1   Please note these changes made to your medications:   *Please START taking:  Cephalexin  500 mg twice daily for 4 more days( Please start taking tonight) Acetaminophen ( Tylenol ) 500 mg 3 times a day Oxycodone  5 mg up to 3 times a day   *Please STOP taking:  Hydrocortisone  1 % ointment Mupirocin  ointment

## 2024-05-09 NOTE — Progress Notes (Signed)
 D/C order noted. Contacted [facility] to be advised of pt's d/c today and that pt should resume care on Thursday.   Randine Mungo Renal Navigator [phone]

## 2024-05-09 NOTE — TOC Transition Note (Addendum)
 Transition of Care [facility]) - Discharge Note   Patient Details  Name: Anna Carr MRN: [id number] Date of Birth: 03/17/1941  Transition of [facility]) CM/SW Contact:  Tom-Johnson, Harvest Muskrat, RN Phone Number: 05/09/2024, 3:34 PM   Clinical Narrative:     Patient is scheduled for discharge today.  Home health info, Outpatient Palliative referral with [facility], hospital f/u and discharge instructions on AVS. Patient will continue outpatient wound care at [facility], has upcoming appointment scheduled.  Prescriptions sent to [facility] pharmacy and patient will receive meds prior discharge. Daughter, Verneita to transport at discharge.  No further TOC needs noted.        Final next level of care: Home w Home Health Services Barriers to Discharge: Barriers Resolved   Patient Goals and CMS Choice Patient states their goals for this hospitalization and ongoing recovery are:: To return home CMS Medicare.gov Compare Post Acute Care list provided to:: Patient Choice offered to / list presented to : Patient, Adult Children (Daughter, Verneita)      Discharge Placement                Patient to be transferred to facility by: Daughter Name of family member notified: [facility]    Discharge Plan and Services Additional resources added to the After Visit Summary for                  DME Arranged: N/A DME Agency: NA       HH Arranged: PT, OT HH Agency: [facility] Date [facility] Agency Contacted: 05/09/24 Time HH Agency Contacted: 1427 Representative spoke with at [facility] Agency: [place]  Social Drivers of Health (SDOH) Interventions SDOH Screenings   Food Insecurity: No Food Insecurity (05/06/2024)  Housing: Low Risk  (05/06/2024)  Transportation Needs: No Transportation Needs (05/06/2024)  Utilities: Not At Risk (05/06/2024)  Financial Resource Strain: Low Risk  (10/27/2021)   Received from [facility]  Social Connections: Socially Isolated (05/06/2024)  Stress: No  Stress Concern Present (10/27/2021)   Received from [facility]  Tobacco Use: Medium Risk (05/04/2024)     Readmission Risk Interventions    04/11/2024    2:26 PM  Readmission Risk Prevention Plan  Transportation Screening Complete  PCP or Specialist Appt within 5-7 Days Complete  Home Care Screening Complete  Medication Review (RN CM) Referral to Pharmacy

## 2024-05-09 NOTE — Progress Notes (Signed)
 Received patient in bed to unit.  Alert and oriented.  Informed consent signed and in chart.   TX duration:3 hours  Patient cartridge clotted off, and we had to change cartridge.  Overall patient tolerated well.  Transported back to the room  Alert, without acute distress.  Hand-off given to patient's nurse.   Access used: Left AV Fistula Upper ARm Access issues: High arterial pressures, BFR to 350  Total UF removed: 3L Medication(s) given: midodrine    05/09/24 1159  Vitals  Temp (!) 96.2 F (35.7 C)  Temp Source Axillary  BP (!) 108/46  MAP (mmHg) (!) 64  Pulse Rate (!) 45  ECG Heart Rate 76  Resp 12  Oxygen Therapy  SpO2 100 %  O2 Device Room Air  During Treatment Monitoring  Duration of HD Treatment -hour(s) 3 hour(s)  HD Safety Checks Performed Yes  Intra-Hemodialysis Comments Tx completed  Dialysis Fluid Bolus Normal Saline  Bolus Amount (mL) 300 mL  Post Treatment  Dialyzer Clearance Clear  Liters Processed 66.3  Fluid Removed (mL) 3000 mL  Tolerated HD Treatment Yes  Post-Hemodialysis Comments Patient cartridge clotted off with 1 more hour, had to put new set up  AVG/AVF Arterial Site Held (minutes) 7 minutes  AVG/AVF Venous Site Held (minutes) 7 minutes  Fistula / Graft Left Upper arm Arteriovenous vein graft  Placement Date/Time: 03/12/21 1254   Orientation: Left  Access Location: Upper arm  Access Type: Arteriovenous vein graft  Status Deaccessed     Camellia Brasil LPN Kidney Dialysis Unit

## 2024-05-09 NOTE — Progress Notes (Signed)
 Cardiology Office Note:  .   Date:  05/09/2024  ID:  Anna Carr, DOB [birth year], MRN [id number] PCP: Thurmond Cathlyn LABOR., MD  [facility] Providers Cardiologist:  Lamar Fitch, MD Cardiology APP:  Carlin Delon BROCKS, NP    History of Present Illness: .   Anna Carr is a 83 y.o. female with a past medical history of moderate to severe aortic stenosis, moderate to severe mitral regurgitation, ESRD on HD, hypertension, dyslipidemia  04/10/2024 echo EF 50 to 55%, mild concentric LVH, grade 1 DD, moderate to severe MR 12/29/2023 echo EF of 60 to 65%, moderately elevated PASP, moderate to severe mitral regurgitation and mild to moderate stenosis, mild to moderate tricuspid regurgitation, no evidence of aortic valve stenosis. 06/04/2023 echo EF 55 to 60%, moderate mitral regurgitation with mild to moderate mitral stenosis, moderate aortic stenosis, mild to moderate tricuspid regurgitation 01/31/2020 Lexiscan  normal, low risk study  [facility] with [facility] 2021 for management evaluation of heart failure.  At that time she had a Lexiscan  which was abnormal low risk study. Evaluated by Dr. Fitch on 12/01/2023, concerned with exhaustion and lower extremity numbness.  Echocardiogram was arranged, she was advised to follow-up with Dr. Court regarding her PVD.  Echocardiogram was completed on 12/29/2023 revealing an EF of 60 to 65%, moderately elevated PASP, moderate to severe mitral regurgitation and mild to moderate stenosis, mild to moderate tricuspid regurgitation, no evidence of aortic valve stenosis.  Most recently she was evaluated by Dr. Fitch on 02/23/2024, she was feeling poorly, and recently started on Lyrica, plans were made to follow back up in 1 week.  She has had several hospitalizations since she was last evaluated in our office for anemia, hematochezia, GI bleed, critical limb ischemia.  Most recent hospitalization was on 05/04/2024 for anemia, received 1 unit  PRBC, plans to set up with a hematologist outpatient. Outpatient palliative care was set up for her.  During one of her prior hospitalization showed critical limb ischemia and was recommended started on DAPT by vascular however she unable to tolerate secondary to acute blood loss, no amenable option for vein graft.  She presents today accompanied by both of her children for follow-up after hospital discharge yesterday.  Her children are concerned about her foot, they apparently saw her PCP this morning, home health was arranged to begin treatment.  They question whether or not she is going to have her valve replaced, did not appear they have a good insight to her overall prognosis-- states it was mentioned at some point and they would like clarification.  She has a persistent cough it has been going on for approximately a year however her breathing is overall at her baseline, she does have pedal edema, will have hemodialysis tomorrow. She denies chest pain, palpitations, pnd, orthopnea, n, v, dizziness, syncope,weight gain, or early satiety.     ROS: Review of Systems  Constitutional:  Positive for malaise/fatigue.  Respiratory:  Positive for cough.   Neurological:  Positive for sensory change.  Endo/Heme/Allergies:  Bruises/bleeds easily.     Studies Reviewed: .         Risk Assessment/Calculations:             Physical Exam:   VS:  There were no vitals taken for this visit.   Wt Readings from Last 3 Encounters:  05/09/24 150 lb 5.7 oz (68.2 kg)  04/29/24 153 lb (69.4 kg)  04/18/24 159 lb 13.3 oz (72.5 kg)  GEN: Well nourished, well developed in no acute distress NECK: No JVD; No carotid bruits CARDIAC: RRR, 3/6 systolic murmurs, rubs, gallops RESPIRATORY:  RLL diminished ABDOMEN: Soft, non-tender, non-distended EXTREMITIES:  +2 pitting edema to knee; right foot with several wounds in various stages of healing  ASSESSMENT AND PLAN: .   PAD - following with VVS, will see them on  05/26/2024, recommendations had been for DAPT however she cannot tolerate secondary to GI bleed. They are hoping to avoid amputation. Outpatient palliative care has been appropriately arranged at hospital discharge.   Moderate to severe mitral regurgitation-seems to be ongoing confusion surrounding replacing her valve, at some point someone mentioned it and they want to know what the plan is to replace her valve today. I do not think she is a good surgical candidate but I will discuss with Dr. Wendel with our Structural Heart Team.   ESRD-on HD Tuesday, Thursday, Saturday  Hypertension-now requiring midodrine  for blood pressure support.  Palliative care -family met with palliative care during hospitalization, I inquired if they planned to continue follow-up as outpatient and they advised yes  is that go to be a good thing for her, I attempted to explain what palliative care would entail, I believe they were hoping for more curative options, and seem to have some deficits in her overall health.  Anemia - referred to hematology.      Dispo: 6 weeks with Dr. Krasowski.   Signed, Delon [name] Hoover, NP

## 2024-05-09 NOTE — Plan of Care (Signed)
  Problem: Coping: Goal: Ability to adjust to condition or change in health will improve Outcome: Adequate for Discharge   Problem: Fluid Volume: Goal: Ability to maintain a balanced intake and output will improve Outcome: Adequate for Discharge   Problem: Health Behavior/Discharge Planning: Goal: Ability to identify and utilize available resources and services will improve Outcome: Adequate for Discharge Goal: Ability to manage health-related needs will improve Outcome: Adequate for Discharge   Problem: Metabolic: Goal: Ability to maintain appropriate glucose levels will improve Outcome: Adequate for Discharge   Problem: Nutritional: Goal: Maintenance of adequate nutrition will improve Outcome: Adequate for Discharge Goal: Progress toward achieving an optimal weight will improve Outcome: Adequate for Discharge   Problem: Skin Integrity: Goal: Risk for impaired skin integrity will decrease Outcome: Adequate for Discharge   Problem: Tissue Perfusion: Goal: Adequacy of tissue perfusion will improve Outcome: Adequate for Discharge   Problem: Tissue Perfusion: Goal: Adequacy of tissue perfusion will improve Outcome: Adequate for Discharge   Problem: Health Behavior/Discharge Planning: Goal: Ability to manage health-related needs will improve Outcome: Adequate for Discharge   Problem: Education: Goal: Knowledge of General Education information will improve Description: Including pain rating scale, medication(s)/side effects and non-pharmacologic comfort measures Outcome: Adequate for Discharge   Problem: Clinical Measurements: Goal: Ability to maintain clinical measurements within normal limits will improve Outcome: Adequate for Discharge Goal: Will remain free from infection Outcome: Adequate for Discharge Goal: Diagnostic test results will improve Outcome: Adequate for Discharge Goal: Respiratory complications will improve Outcome: Adequate for Discharge Goal:  Cardiovascular complication will be avoided Outcome: Adequate for Discharge

## 2024-05-09 NOTE — Discharge Summary (Cosign Needed)
 Name: Anna Carr MRN: [id number] DOB: [birth year] 83 y.o. PCP: Thurmond Cathlyn LABOR., MD  Date of Admission: 05/04/2024  5:35 PM Date of Discharge:05/09/2024 Attending Physician: Dr. Mliss Pouch  Discharge Diagnosis: 1. Principal Problem:   Acute blood loss anemia Active Problems:   Anemia in chronic kidney disease   CHF with left ventricular diastolic dysfunction, NYHA class 1 ([name])   Type 2 diabetes mellitus with chronic kidney disease on chronic dialysis, with long-term current use of insulin  ([name])   ESRD on hemodialysis ([name])   Hypertension   Mitral regurgitation moderate to severe based on echocardiogram from 2022   Peripheral vascular disease, unspecified ([name]) critical stenosis lower extremity   Angiodysplasia of small intestine    Discharge Medications: Allergies as of 05/09/2024       Reactions   Atorvastatin  Other (See Comments)   Constipation   Wound Dressing Adhesive Rash        Medication List     STOP taking these medications    hydrocortisone  1 % ointment   mupirocin  ointment 2 % Commonly known as: BACTROBAN    polyethylene glycol 17 g packet Commonly known as: MIRALAX  / GLYCOLAX  Replaced by: polyethylene glycol powder 17 GM/SCOOP powder       TAKE these medications    acetaminophen  500 MG tablet Commonly known as: TYLENOL  Take 2 tablets (1,000 mg total) by mouth every 8 (eight) hours as needed for mild pain (pain score 1-3) or moderate pain (pain score 4-6). What changed:  how much to take when to take this   ascorbic acid  1000 MG tablet Commonly known as: VITAMIN C  Take 1,000 mg by mouth at bedtime.   Biotin  5000 MCG Tabs Take 5,000 mcg by mouth daily in the afternoon.   camphor-menthol  lotion Commonly known as: SARNA Apply 1 Application topically as needed for itching.   carboxymethylcellulose 0.5 % Soln Commonly known as: REFRESH PLUS Place 1 drop into both eyes 3 (three) times daily as needed (dry eyes).   cephALEXin   500 MG capsule Commonly known as: KEFLEX  Take 1 capsule (500 mg total) by mouth 2 (two) times daily for 4 days. Start taking tonight on 7/15.   cyanocobalamin  1000 MCG tablet Commonly known as: VITAMIN B12 Take 1,000 mcg by mouth every evening.   ferric citrate  1 GM 210 MG(Fe) tablet Commonly known as: AURYXIA  Take 210 mg by mouth 3 (three) times daily with meals.   FISH OIL PO Take 1 capsule by mouth in the morning and at bedtime.   furosemide  80 MG tablet Commonly known as: LASIX  Take 80 mg by mouth daily in the afternoon.   lidocaine -prilocaine  cream Commonly known as: EMLA  Apply 1 application topically as needed (prior to fisula access). Tuesday, Thursday, Saturday for dialysis   midodrine  10 MG tablet Commonly known as: PROAMATINE  Take 10 mg by mouth 3 (three) times daily.   ondansetron  4 MG disintegrating tablet Commonly known as: ZOFRAN -ODT Take 4 mg by mouth every 8 (eight) hours as needed.   oxyCODONE  5 MG immediate release tablet Commonly known as: Oxy IR/ROXICODONE  Take 1 tablet (5 mg total) by mouth every 8 (eight) hours as needed for up to 5 days for severe pain (pain score 7-10).   pantoprazole  40 MG tablet Commonly known as: PROTONIX  Take 1 tablet (40 mg total) by mouth 2 (two) times daily.   polyethylene glycol powder 17 GM/SCOOP powder Commonly known as: GLYCOLAX /MIRALAX  Take 17 g by mouth daily. Replaces: polyethylene glycol 17 g packet  PROBIOTIC ACIDOPHILUS PO Take 1 capsule by mouth in the morning.   rosuvastatin  10 MG tablet Commonly known as: CRESTOR  Take 1 tablet (10 mg total) by mouth daily.   senna 8.6 MG Tabs tablet Commonly known as: SENOKOT Take 1 tablet (8.6 mg total) by mouth daily. What changed:  when to take this reasons to take this   Soliqua  100-33 UNT-MCG/ML Sopn Generic drug: Insulin  Glargine-Lixisenatide  Inject 6 Units into the skin in the morning.   Vitamin E  670 MG (1000 UT) Caps Take 1,000 Units by mouth in the  morning.   Zinc  50 MG Tabs Take 50 mg by mouth daily.               Discharge Care Instructions  (From admission, onward)           Start     Ordered   05/09/24 0000  Discharge wound care:       Comments: RLE ans R heel: Cleanse with vashe #[id number], not rinse. Apply Medihoney daily to the wounds bed (RLE and Heel). Cover with foam dressing changing every 3 days or if is saturated. Keep the leg above the heart level to relieve the edema. The pt cannot be wrapped due the PAD (peripheral artery disease). If need to hold the foam on the heel. Wrap with Kerlix (gauze bandage) with no pressure, since the base of the toes, wrapping the heel.   05/09/24 1457            Disposition and follow-up:   Ms.Denelda EMILYROSE DARRAH was discharged from [facility] in [place] condition.  At the hospital follow up visit please address:  1.  Anemia- Please follow up patient's hemoglobin. Patient's hemoglobin at the time of discharge was 9.9 and has been stable. Patient only required 1 unit PRBC while in the hospital.   - Patient will need to establish outpatient follow with hematologist to set up Hgb checks, transfusions, IV iron . Please make sure referral is set up at hospital F/U appointment  - GI doctors suggested starting LAR octreotide. Patient will need prior authorization for this. This is due to her history of duodenal AVMs. Recommendation for medical management of bleeds vs intervention  Cellulitis- Patient was started on antibiotics for signs of infection in her right foot and lower extremity. Please make sure that she has completed this course of antibiotics.    2.  Labs / imaging needed at time of follow-up: CBC  3.  Pending labs/ test needing follow-up: none  Follow-up Appointments:  Follow-up Information     Lanis Fonda BRAVO, MD. Go on 05/26/2024.   Specialty: Vascular Surgery Contact information: [address] [place] [address] [phone]          [facility] Follow up.   Why: Someone will call you to schedule first home visit. Contact information: [address] [place]  [address] [phone]                 Hospital Course by problem list: SHAWANA KNOCH is a 83 y.o. person living with a history of recurrent GI bleeds with duodenal AVMs, ESRD on HD (TTS), HFpEF, PAD with RLE critical limb ischemia without revascularization option, who presents to the ED for bright red blood per rectum and admitted for acute blood loss anemia.   Acute Blood Loss Anemia Hematochezia Duodenal AVMs Patient has a history of GI bleed complicated by duodenal AVMs, which has not been treated due to bradycardic arrest during EGD.  At discharge on 7/5, Hgb was 9.3 and on this admission was 7.2  Patient was discharged on 7/5, and was sent home on DAPT due to outpatient evaluation with vascular surgery for revascularization.On 7/7 patient reported feeling weak. She had reported weakness at previous hospital admission as well. Due to her weakness, patient presented at [facility] and was sent home in stable condition. Patient then started having multiple bloody stools and called Vascular Surgery office who told her to discontinue DAPT. Patient was  Daughter showed picture of dark stool with bright red blood in the toilet bowl.Patient has received 1u PRBC. Hemoglobin was 9.9 at the time of discharge. Patient did not require any more transfusions while in the hospital. Will need to follow up with hematologist outpatient.   Right Lower Extremity Critical Limb Ischemia PAD Patient had presented to [facility] ED on prior admission for weakness and was found to have right critical limb ischemia. Dr. Lanis with vascular surgery was seeing patient at last hospitalization. Was recommended for patient to start DAPT, which patient tolerated well while in the hospital. Patient was unfortunately not able to tolerate DAPT outside the  hospital and had to stop due acute blood loss from GI source of bleeding. Patient is at high risk for complications with revascularization and does not have amenable option for vein graft.Patient does not have any other option except amputation at this time due to inability to tolerate DAPT. This was discussed with patient and daughter. Patient was restarted on antibiotics for possible signs of cellulitis at the time of discharge. Will need to follow up with vascular surgery outpatient.   Swallow Function Patient denies any pain with swallowing. Patient endorses trouble swallowing foods like eggs but not liquids.Patient was having some coughing while trying to swallow.  Pt had MBS with SLP in May 2025. Showed esophageal retention of barium tablet at that time but minimal penetration of thin liquids. Esophagram was recommended by SLP but patient would not be a candidate for any endoscopic interventions. Continue protonix  40 mg BID at home.  ESRD on HD TTS  - Patient continued on Tuesday, Thursday, Saturday schedule for dialysis   Hypertension Patient's blood pressure has tendency to run on the hypotensive side, although they have been running normotensive during this admission with occasional lows. Patient is on midodrine  10 mg TID to keep blood pressures within acceptable range and so that she can tolerate dialysis.    Discharge Subjective: Patient was doing well and sitting in reclining chair. Says that she was excited to eat her hamburger. Daughter was with her at bedside. Patient reports that she only gets pain in her foot usually when she is walking on it. Patient reports that she is tired after dialysis today. Explained to patient and daughter discharge instructions and instructions for wound care. No other complaints at this time.   Discharge Exam:   BP (!) 116/55 (BP Location: Right Arm)   Pulse 73   Temp 97.6 F (36.4 C) (Oral)   Resp 18   Ht 5' 1 (1.549 m)   Wt 68.2 kg   SpO2 100%    BMI 28.41 kg/m  Discharge exam:  Cardiovascular:     Rate and Rhythm: Normal rate and regular rhythm.     Heart sounds: Normal heart sounds.  Pulmonary:     Effort: Pulmonary effort is normal.     Breath sounds: Normal breath sounds.  Musculoskeletal:     Right lower leg: Edema present.  Left lower leg: Edema present.     Comments: Patient's right foot is ruborous with punched out ulcers scattered around right foot and medial lower extremity.  Patient has pedal edema in right foot and ischemic changes in the form of early necrotic skin around DP distribution.  No TP or DP palpated. Left and Right lower extremities are cool to the touch in the anterior shin area bilaterally. Right foot and toes are cool to the touch and sensitive as well.  There is also a white lesion below her lateral malleolus and lesion on the back of her right heel.  Neurological:     Mental Status: She is alert.          Pertinent Labs, Studies, and Procedures:     Latest Ref Rng & Units 05/09/2024    5:36 AM 05/08/2024    5:29 AM 05/07/2024   12:45 PM  CBC  WBC 4.0 - 10.5 K/uL 9.4  10.0  8.6   Hemoglobin 12.0 - 15.0 g/dL 9.9  9.8  9.1   Hematocrit 36.0 - 46.0 % 32.1  31.2  28.7   Platelets 150 - 400 K/uL 162  190  185        Latest Ref Rng & Units 05/09/2024    5:36 AM 05/08/2024    5:29 AM 05/07/2024   12:45 PM  CMP  Glucose 70 - 99 mg/dL 828  883  803   BUN 8 - 23 mg/dL 49  38  36   Creatinine 0.44 - 1.00 mg/dL 3.68  4.56  5.35   Sodium 135 - 145 mmol/L 125  128  129   Potassium 3.5 - 5.1 mmol/L 5.1  5.1  4.2   Chloride 98 - 111 mmol/L 87  90  91   CO2 22 - 32 mmol/L 18  21  22    Calcium  8.9 - 10.3 mg/dL 9.0  8.9  8.7     No results found.   Discharge Instructions: Discharge Instructions     Diet - low sodium heart healthy   Complete by: As directed    Discharge wound care:   Complete by: As directed    RLE ans R heel: Cleanse with vashe #[id number], not rinse. Apply Medihoney daily to the  wounds bed (RLE and Heel). Cover with foam dressing changing every 3 days or if is saturated. Keep the leg above the heart level to relieve the edema. The pt cannot be wrapped due the PAD (peripheral artery disease). If need to hold the foam on the heel. Wrap with Kerlix (gauze bandage) with no pressure, since the base of the toes, wrapping the heel.   Increase activity slowly   Complete by: As directed        Signed: D'Mello, Darvell Monteforte, DO 05/09/2024, 3:31 PM

## 2024-05-09 NOTE — Progress Notes (Signed)
 Physical Therapy Treatment Patient Details Name: Anna Carr MRN: [id number] DOB: Apr 20, 1941 Today's Date: 05/09/2024   History of Present Illness Anna Carr is a 83 yo female who presented with bright red blood in stool and increased weakness. Admitted for acute blood loss anemia. PMH positive for recent admissions 5/8-5/19 and 6/23-6/25 and 6/30-7/5 with GIB and symptomatic anemia, Right Lower Extremity Critical Limb Ischemia, ESRD on HD TTS, PAD, HFpEF, DM, HTN, anemia and h/o falls.    PT Comments  Continuing work on functional mobility and activity tolerance;  session focused on functional transfers post HD, with particular attention to discernment re: getting home today; in order to safely get home, pt must at least be able to perform basic bed to chair transfers, that can be generalized to the ability to perform wheelchair transfers -- which she must be able to do to get to wheelchair>car>wheelchair>HD recliner to get to outpt HD (and then reverse on the was home, of course); good step pivot transfer with RW today; and pt reports confidence in her ability to perform a car transfer, too; OK for dc home from PT standpoint, rec resuming HHPT   If plan is discharge home, recommend the following: A little help with walking and/or transfers;A little help with bathing/dressing/bathroom;Assistance with cooking/housework;Direct supervision/assist for medications management;Direct supervision/assist for financial management;Assist for transportation;Help with stairs or ramp for entrance   Can travel by private vehicle        Equipment Recommendations  None recommended by PT    Recommendations for Other Services       Precautions / Restrictions Precautions Precautions: Fall Recall of Precautions/Restrictions: Intact Precaution/Restrictions Comments: BP, HR, SpO2     Mobility  Bed Mobility Overal bed mobility: Needs Assistance Bed Mobility: Supine to Sit     Supine to sit:  Supervision     General bed mobility comments: Sat up to long sit smoothly, then turned to sitting EOB    Transfers Overall transfer level: Needs assistance Equipment used: Rolling walker (2 wheels) Transfers: Sit to/from Stand, Bed to chair/wheelchair/BSC Sit to Stand: Supervision   Step pivot transfers: Contact guard assist (with and without physical contact)       General transfer comment: Good use of RW for support; painful RLE, but tolerated a few steps to recliner    Ambulation/Gait               General Gait Details: Politely declined progressive amb   Stairs             Wheelchair Mobility     Tilt Bed    Modified Rankin (Stroke Patients Only)       Balance     Sitting balance-Leahy Scale: Good       Standing balance-Leahy Scale: Fair                              Hotel manager: Impaired Factors Affecting Communication: Hearing impaired  Cognition Arousal: Alert Behavior During Therapy: WFL for tasks assessed/performed   PT - Cognitive impairments: No apparent impairments                         Following commands: Intact      Cueing    Exercises      General Comments General comments (skin integrity, edema, etc.): NAD on RA; RN in teh room and to change dressings R foot and ankle  Pertinent Vitals/Pain Pain Assessment Pain Assessment: Faces Faces Pain Scale: Hurts even more Pain Location: RLE, and notable pain L heel with palpation Pain Descriptors / Indicators: Discomfort, Grimacing, Guarding, Moaning Pain Intervention(s): Monitored during session, Limited activity within patient's tolerance, Other (comment) (RN to change dressings; Pt used RW to unweight painful RLE in stance)    Home Living                          Prior Function            PT Goals (current goals can now be found in the care plan section) Acute Rehab PT Goals Patient Stated Goal:  get back to independent; hopes to dc home today PT Goal Formulation: With patient Time For Goal Achievement: 05/19/24 Potential to Achieve Goals: Good Progress towards PT goals: Progressing toward goals    Frequency    Min 2X/week      PT Plan      Co-evaluation              AM-PAC PT 6 Clicks Mobility   Outcome Measure  Help needed turning from your back to your side while in a flat bed without using bedrails?: None Help needed moving from lying on your back to sitting on the side of a flat bed without using bedrails?: None Help needed moving to and from a bed to a chair (including a wheelchair)?: A Little Help needed standing up from a chair using your arms (e.g., wheelchair or bedside chair)?: A Little Help needed to walk in hospital room?: A Little Help needed climbing 3-5 steps with a railing? : A Little 6 Click Score: 20    End of Session   Activity Tolerance: Patient tolerated treatment well (despite pain) Patient left: in chair;with call bell/phone within reach;with nursing/sitter in room Nurse Communication: Mobility status PT Visit Diagnosis: Other abnormalities of gait and mobility (R26.89);Muscle weakness (generalized) (M62.81);Pain Pain - Right/Left: Right Pain - part of body: Ankle and joints of foot     Time: 1252-1310 PT Time Calculation (min) (ACUTE ONLY): 18 min  Charges:    $Therapeutic Activity: 8-22 mins PT General Charges $$ ACUTE PT VISIT: 1 Visit                     Silvano Currier, PT  Acute Rehabilitation Services Office [phone] Secure Chat welcomed    Silvano VEAR Currier 05/09/2024, 1:22 PM

## 2024-05-09 NOTE — Progress Notes (Addendum)
  [facility] Progress Note   Subjective:  Seen on HD - 3L UFG and tolerating. Mood has improved - palliative care met with her, she now wants to continue aggressive measures/remain full code. I discussed with her to let us  know if she changes her mind. Her R leg pain is better today with current meds. Denies CP/dyspnea.  Objective Vitals:   05/09/24 0830 05/09/24 0900 05/09/24 0930 05/09/24 0945  BP: 130/67 (!) 118/42 (!) 139/50 125/67  Pulse: 92 75 (!) 55 (!) 38  Resp: 15 12 18 13   Temp:      TempSrc:      SpO2: 100% 100% 100% 100%  Weight:      Height:       Physical Exam General: Chronically ill appearing, NAD. Room air Heart: RRR; 2/6 murmur Lungs: CTAB  Abdomen: soft Extremities: RLE with scattered ulcers and erythema, necrotic area to dorsal R foot Dialysis Access: AVG +t/b  Additional Objective Labs: Basic Metabolic Panel: Recent Labs  Lab 05/07/24 1245 05/08/24 0529 05/09/24 0536  NA 129* 128* 125*  K 4.2 5.1 5.1  CL 91* 90* 87*  CO2 22 21* 18*  GLUCOSE 196* 116* 171*  BUN 36* 38* 49*  CREATININE 4.64* 5.43* 6.31*  CALCIUM  8.7* 8.9 9.0  PHOS 4.6 4.9* 5.1*   Liver Function Tests: Recent Labs  Lab 05/04/24 1750 05/05/24 0435 05/07/24 1245 05/08/24 0529 05/09/24 0536  AST 48*  --   --   --   --   ALT 31  --   --   --   --   ALKPHOS 108  --   --   --   --   BILITOT 1.2  --   --   --   --   PROT 6.0*  --   --   --   --   ALBUMIN  3.0*   < > 2.6* 2.6* 2.6*   < > = values in this interval not displayed.   CBC: Recent Labs  Lab 05/05/24 0435 05/05/24 1606 05/06/24 0531 05/07/24 1245 05/08/24 0529 05/09/24 0536  WBC 5.6  --  7.4 8.6 10.0 9.4  NEUTROABS 3.6  --   --   --   --   --   HGB 7.9*   < > 9.0* 9.1* 9.8* 9.9*  HCT 25.1*   < > 27.9* 28.7* 31.2* 32.1*  MCV 98.8  --  99.6 101.8* 101.0* 103.9*  PLT 182  --  199 185 190 162   < > = values in this interval not displayed.   Medications:  albumin  human     anticoagulant sodium  citrate      acetaminophen   1,000 mg Oral TID   ascorbic acid   1,000 mg Oral QHS   cephALEXin   500 mg Oral BID   Chlorhexidine  Gluconate Cloth  6 each Topical Q0600   clotrimazole    Topical BID   cyanocobalamin   1,000 mcg Oral QPM   darbepoetin (ARANESP ) injection - DIALYSIS  200 mcg Subcutaneous Q Fri-1800   ferric citrate   210 mg Oral TID WC   insulin  aspart  0-9 Units Subcutaneous TID WC   leptospermum manuka honey  1 Application Topical Daily   liver oil-zinc  oxide   Topical BID   midodrine   10 mg Oral TID   pantoprazole   40 mg Oral BID   polyethylene glycol  17 g Oral Daily   rosuvastatin   10 mg Oral Daily   senna  2 tablet Oral Daily  Vitamin E   800 Units Oral q AM    Dialysis Orders TTS [facility]  3.5hr, 400/A1.5, EDW 70kg, 2K/2.5Ca bath, LUE AVG, no heparin  - Mircera 200mcg IV q 2 weeks (ordered, last given early June) - Calcitriol  0.25mcg PO q HD   Assessment/Plan: Symptomatic anemia: Chronic GIB, known AVMs. Hx bradycardic arrest during last EGD. S/p 1U PRBCs 7/10, s/p Aranesp  200mcg on 7/11 - continue weekly. PAD/RLE ischemia: Severe RLE pain. VVS consulted. High risk for any procedure and unable to tolerate DAPT. R foot with necrotic dorsal wound and punched out ulcerations on lower leg. ESRD: Continue HD on TTS schedule - HD now, 3L UFG. Hypotension/volume: On mido TID, does have LE edema. HypoNa reflects volume excess. Secondary HPTH: Ca/Phos ok - continue Auryxia  binder.  T2DM. Insulin  per primary  Dysphagia: Defer to primary. On PPI. GOC: Declining health. She is not interested in SNF. Palliative care met with her - she wants to continue full aggressive measures at this time. Will keep the discussions going.   Izetta Boehringer, PA-C 05/09/2024, 10:01 AM  [facility]   Seen and examined independently.  Agree with note and exam as documented above by physician extender and as noted here.  Seen and examined on dialysis.  Blood pressure 140/54 and  HR 77.  Tolerating goal.  Procedure supervised. Left AVG in use.   Hyponatremia - optimize volume with HD  Katheryn [name] Saba, MD 05/09/2024  10:24 AM

## 2024-05-09 NOTE — Progress Notes (Signed)
 DISCHARGE NOTE HOME DREAMER CARILLO to be discharged Home per MD order. Discussed prescriptions and follow up appointments with the patient. Prescriptions given to patient; medication list explained in detail. Patient verbalized understanding.  Skin clean, dry and intact without evidence of skin break down, no evidence of skin tears noted. IV catheter discontinued intact. Site without signs and symptoms of complications. Dressing and pressure applied. Pt denies pain at the site currently. No complaints noted.  Home health set up to assist in care of RLE wounds and pt also taught during dressing change today.   An After Visit Summary (AVS) was printed and given to the patient. Patient escorted via wheelchair, and discharged home via private auto.  Cyndra Feinberg A Proctor-Gann, RN

## 2024-05-09 NOTE — Telephone Encounter (Signed)
 Patient Product/process development scientist completed.    The patient is insured through [facility]. Patient has Medicare and is not eligible for a copay card, but may be able to apply for patient assistance or Medicare RX Payment Plan (Patient Must reach out to their plan, if eligible for payment plan), if available.    Ran test claim for octreotide 10 mg Inj Kit and Requires Prior Authorization   This test claim was processed through [employer]- copay amounts may vary at other pharmacies due to [employer], or as the patient moves through the different stages of their insurance plan.     Reyes Sharps, CPHT Pharmacy Technician III Certified Patient Advocate [facility] Patient Advocate Team Direct Number: [phone]  Fax: [phone]

## 2024-05-10 ENCOUNTER — Encounter: Payer: Self-pay | Admitting: Cardiology

## 2024-05-10 ENCOUNTER — Telehealth (HOSPITAL_COMMUNITY): Payer: Self-pay | Admitting: Nephrology

## 2024-05-10 ENCOUNTER — Ambulatory Visit: Attending: Cardiology | Admitting: Cardiology

## 2024-05-10 VITALS — BP 120/70 | HR 78 | Ht 61.0 in | Wt 158.0 lb

## 2024-05-10 DIAGNOSIS — N186 End stage renal disease: Secondary | ICD-10-CM

## 2024-05-10 DIAGNOSIS — I340 Nonrheumatic mitral (valve) insufficiency: Secondary | ICD-10-CM

## 2024-05-10 DIAGNOSIS — Z992 Dependence on renal dialysis: Secondary | ICD-10-CM

## 2024-05-10 DIAGNOSIS — I739 Peripheral vascular disease, unspecified: Secondary | ICD-10-CM | POA: Diagnosis not present

## 2024-05-10 DIAGNOSIS — Z09 Encounter for follow-up examination after completed treatment for conditions other than malignant neoplasm: Secondary | ICD-10-CM

## 2024-05-10 DIAGNOSIS — I953 Hypotension of hemodialysis: Secondary | ICD-10-CM | POA: Insufficient documentation

## 2024-05-10 DIAGNOSIS — Z515 Encounter for palliative care: Secondary | ICD-10-CM

## 2024-05-10 NOTE — Telephone Encounter (Signed)
 Thank you :)

## 2024-05-10 NOTE — Telephone Encounter (Signed)
 Transition of Care - Initial Contact after Hospitalization  Date of discharge:  05/09/24 Date of contact: 05/10/24  Method: Phone Spoke to: Patient's daughter  Patient contacted to discuss transition of care from recent inpatient hospitalization. Patient was admitted to [facility] from 7/10-7/15/25 with symptomatic anemia, RLE critical limb ischemia, R foot cellulitis, dysphagia.  The discharge medication list was reviewed. Patient understands the changes and has no concerns.   Patient will return to his/her outpatient HD unit on: tomorrow - anticipate being there.  They are trying to arrange f/u appts with PCP and cardiology.  Some pain today, but she is managing. No other concerns at this time.  Izetta Boehringer, PA-C [employer] Pager [phone]

## 2024-05-10 NOTE — Patient Instructions (Signed)
 Medication Instructions:  Your physician recommends that you continue on your current medications as directed. Please refer to the Current Medication list given to you today.   *If you need a refill on your cardiac medications before your next appointment, please call your pharmacy*  Lab Work: None If you have labs (blood work) drawn today and your tests are completely normal, you will receive your results only by: MyChart Message (if you have MyChart) OR A paper copy in the mail If you have any lab test that is abnormal or we need to change your treatment, we will call you to review the results.  Testing/Procedures: None  Follow-Up: At [facility], you and your health needs are our priority.  As part of our continuing mission to provide you with exceptional heart care, our providers are all part of one team.  This team includes your primary Cardiologist (physician) and Advanced Practice Providers or APPs (Physician Assistants and Nurse Practitioners) who all work together to provide you with the care you need, when you need it.  Your next appointment:   6 week(s)  Provider:   Lamar Fitch, MD    We recommend signing up for the patient portal called MyChart.  Sign up information is provided on this After Visit Summary.  MyChart is used to connect with patients for Virtual Visits (Telemedicine).  Patients are able to view lab/test results, encounter notes, upcoming appointments, etc.  Non-urgent messages can be sent to your provider as well.   To learn more about what you can do with MyChart, go to [email or web address].   Other Instructions None

## 2024-05-12 ENCOUNTER — Telehealth: Payer: Self-pay | Admitting: Cardiology

## 2024-05-12 NOTE — Telephone Encounter (Signed)
 A lot of confusion from this family regarding valve replacement.  I reached out to Dr. 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 We will just manage what we can, fluid status managed by HD.   Continue with palliative care, that was recommended in the hospital.

## 2024-05-13 NOTE — ED Provider Notes (Signed)
 [facility] Provider Note   CSN: [id number] Arrival date & time: 05/04/24  1735     Patient presents with: GI Problem and Weakness   Anna Carr is a 83 y.o. female.   HPI   83 year old female presents emergency department with concern for bloody stools.  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  Currently on aspirin  and Plavix  due to critical limb ischemia the lower extremities as not amendable to surgery.  Patient stopped these medications yesterday when she noted bleeding.  Is accompanied by family member, both of which have noted bright red/maroon bowel movements.  Now complaining of weakness and fatigue.  Prior to Admission medications   Medication Sig Start Date End Date Taking? Authorizing Provider  ascorbic acid  (VITAMIN C ) 1000 MG tablet Take 1,000 mg by mouth at bedtime. 12/07/19  Yes [provider]  Biotin  5000 MCG TABS Take 5,000 mcg by mouth daily in the afternoon.   Yes [provider]  camphor-menthol  VIKKI) lotion Apply 1 Application topically as needed for itching. 04/29/24  Yes D'Mello, Rosalyn, DO  carboxymethylcellulose (REFRESH PLUS) 0.5 % SOLN Place 1 drop into both eyes 3 (three) times daily as needed (dry eyes).   Yes [provider]  cyanocobalamin  (VITAMIN B12) 1000 MCG tablet Take 1,000 mcg by mouth every evening.   Yes [provider]  ferric citrate  (AURYXIA ) 1 GM 210 MG(Fe) tablet Take 210 mg by mouth 3 (three) times daily with meals.   Yes [provider]  furosemide  (LASIX ) 80 MG tablet Take 80 mg by mouth daily in the afternoon. 04/03/21  Yes [provider]  Insulin  Glargine-Lixisenatide  (SOLIQUA ) 100-33 UNT-MCG/ML SOPN Inject 6 Units into the skin in the morning. 03/11/24  Yes Zheng, Michael, DO  Lactobacillus (PROBIOTIC ACIDOPHILUS PO) Take 1 capsule by mouth in the morning.   Yes [provider]   lidocaine -prilocaine  (EMLA ) cream Apply 1 application topically as needed (prior to fisula access). Tuesday, Thursday, Saturday for dialysis 07/04/20  Yes [provider]  midodrine  (PROAMATINE ) 10 MG tablet Take 10 mg by mouth 3 (three) times daily. 05/04/24  Yes [provider]  Omega-3 Fatty Acids (FISH OIL PO) Take 1 capsule by mouth in the morning and at bedtime.   Yes [provider]  ondansetron  (ZOFRAN -ODT) 4 MG disintegrating tablet Take 4 mg by mouth every 8 (eight) hours as needed. 05/03/24  Yes [provider]  pantoprazole  (PROTONIX ) 40 MG tablet Take 1 tablet (40 mg total) by mouth 2 (two) times daily. 03/11/24  Yes Zheng, Michael, DO  rosuvastatin  (CRESTOR ) 10 MG tablet Take 1 tablet (10 mg total) by mouth daily. 04/29/24  Yes D'Mello, Rosalyn, DO  Vitamin E  670 MG (1000 UT) CAPS Take 1,000 Units by mouth in the morning. 01/09/20  Yes [provider]  Zinc  50 MG TABS Take 50 mg by mouth daily.   Yes [provider]  acetaminophen  (TYLENOL ) 500 MG tablet Take 2 tablets (1,000 mg total) by mouth every 8 (eight) hours as needed for mild pain (pain score 1-3) or moderate pain (pain score 4-6). 05/09/24   Zheng, Michael, DO  cephALEXin  (KEFLEX ) 500 MG capsule Take 1 capsule (500 mg total) by mouth 2 (two) times daily for 4 days. Start taking tonight on 7/15. 05/09/24 05/13/24  Elicia Sharper, DO  oxyCODONE  (OXY IR/ROXICODONE ) 5 MG immediate release tablet Take 1 tablet (5 mg total) by mouth every 8 (  eight) hours as needed for up to 5 days for severe pain (pain score 7-10). 05/09/24 05/14/24  Elicia Sharper, DO  polyethylene glycol powder (GLYCOLAX /MIRALAX ) 17 GM/SCOOP powder Take 17 g by mouth daily. 05/09/24   Elicia Sharper, DO  senna (SENOKOT) 8.6 MG TABS tablet Take 1 tablet (8.6 mg total) by mouth daily. 05/09/24   Zheng, Michael, DO    Allergies: Atorvastatin  and Wound dressing adhesive    Review of Systems  Constitutional:  Positive for  appetite change and fatigue. Negative for fever.  Respiratory:  Negative for shortness of breath.   Cardiovascular:  Negative for chest pain.  Gastrointestinal:  Positive for blood in stool. Negative for abdominal distention, abdominal pain, anal bleeding, diarrhea and vomiting.  Skin:  Negative for rash.  Neurological:  Negative for headaches.    Updated Vital Signs BP (!) 116/55 (BP Location: Right Arm)   Pulse 73   Temp 97.6 F (36.4 C) (Oral)   Resp 18   Ht 5' 1 (1.549 m)   Wt 68.2 kg   SpO2 100%   BMI 28.41 kg/m   Physical Exam Vitals and nursing note reviewed.  Constitutional:      Comments: Fatigued appearing  HENT:     Head: Normocephalic.     Mouth/Throat:     Mouth: Mucous membranes are moist.  Cardiovascular:     Rate and Rhythm: Normal rate.  Pulmonary:     Effort: Pulmonary effort is normal. No respiratory distress.  Abdominal:     Palpations: Abdomen is soft.     Tenderness: There is no abdominal tenderness.  Genitourinary:    Comments: Hematochezia noted on exam Skin:    General: Skin is warm.  Neurological:     Mental Status: She is alert and oriented to person, place, and time. Mental status is at baseline.  Psychiatric:        Mood and Affect: Mood normal.     (all labs ordered are listed, but only abnormal results are displayed) Labs Reviewed  COMPREHENSIVE METABOLIC PANEL WITH GFR - Abnormal; Notable for the following components:      Result Value   Chloride 93 (*)    Glucose, Bld 163 (*)    BUN 27 (*)    Creatinine, Ser 3.14 (*)    Total Protein 6.0 (*)    Albumin  3.0 (*)    AST 48 (*)    GFR, Estimated 14 (*)    All other components within normal limits  CBC - Abnormal; Notable for the following components:   RBC 2.24 (*)    Hemoglobin 7.2 (*)    HCT 23.0 (*)    MCV 102.7 (*)    RDW 18.3 (*)    All other components within normal limits  PROTIME-INR - Abnormal; Notable for the following components:   Prothrombin Time 18.5 (*)     INR 1.5 (*)    All other components within normal limits  CBC WITH DIFFERENTIAL/PLATELET - Abnormal; Notable for the following components:   RBC 2.54 (*)    Hemoglobin 7.9 (*)    HCT 25.1 (*)    RDW 19.9 (*)    nRBC 0.4 (*)    All other components within normal limits  RENAL FUNCTION PANEL - Abnormal; Notable for the following components:   Chloride 94 (*)    Glucose, Bld 120 (*)    BUN 34 (*)    Creatinine, Ser 3.80 (*)    Phosphorus 5.1 (*)    Albumin  2.7 (*)  GFR, Estimated 11 (*)    All other components within normal limits  HEMOGLOBIN AND HEMATOCRIT, BLOOD - Abnormal; Notable for the following components:   Hemoglobin 8.1 (*)    HCT 25.5 (*)    All other components within normal limits  GLUCOSE, CAPILLARY - Abnormal; Notable for the following components:   Glucose-Capillary 143 (*)    All other components within normal limits  GLUCOSE, CAPILLARY - Abnormal; Notable for the following components:   Glucose-Capillary 131 (*)    All other components within normal limits  CBC - Abnormal; Notable for the following components:   RBC 2.80 (*)    Hemoglobin 9.0 (*)    HCT 27.9 (*)    RDW 20.0 (*)    nRBC 0.5 (*)    All other components within normal limits  RENAL FUNCTION PANEL - Abnormal; Notable for the following components:   Sodium 129 (*)    Chloride 86 (*)    Glucose, Bld 110 (*)    BUN 44 (*)    Creatinine, Ser 4.67 (*)    Phosphorus 5.7 (*)    Albumin  2.8 (*)    GFR, Estimated 9 (*)    Anion gap 19 (*)    All other components within normal limits  GLUCOSE, CAPILLARY - Abnormal; Notable for the following components:   Glucose-Capillary 135 (*)    All other components within normal limits  RENAL FUNCTION PANEL - Abnormal; Notable for the following components:   Sodium 129 (*)    Chloride 91 (*)    Glucose, Bld 196 (*)    BUN 36 (*)    Creatinine, Ser 4.64 (*)    Calcium  8.7 (*)    Albumin  2.6 (*)    GFR, Estimated 9 (*)    Anion gap 16 (*)    All  other components within normal limits  CBC - Abnormal; Notable for the following components:   RBC 2.82 (*)    Hemoglobin 9.1 (*)    HCT 28.7 (*)    MCV 101.8 (*)    RDW 20.0 (*)    nRBC 0.5 (*)    All other components within normal limits  GLUCOSE, CAPILLARY - Abnormal; Notable for the following components:   Glucose-Capillary 159 (*)    All other components within normal limits  GLUCOSE, CAPILLARY - Abnormal; Notable for the following components:   Glucose-Capillary 235 (*)    All other components within normal limits  GLUCOSE, CAPILLARY - Abnormal; Notable for the following components:   Glucose-Capillary 180 (*)    All other components within normal limits  GLUCOSE, CAPILLARY - Abnormal; Notable for the following components:   Glucose-Capillary 164 (*)    All other components within normal limits  GLUCOSE, CAPILLARY - Abnormal; Notable for the following components:   Glucose-Capillary 170 (*)    All other components within normal limits  RENAL FUNCTION PANEL - Abnormal; Notable for the following components:   Sodium 128 (*)    Chloride 90 (*)    CO2 21 (*)    Glucose, Bld 116 (*)    BUN 38 (*)    Creatinine, Ser 5.43 (*)    Phosphorus 4.9 (*)    Albumin  2.6 (*)    GFR, Estimated 7 (*)    Anion gap 17 (*)    All other components within normal limits  CBC - Abnormal; Notable for the following components:   RBC 3.09 (*)    Hemoglobin 9.8 (*)    HCT 31.2 (*)  MCV 101.0 (*)    RDW 19.9 (*)    nRBC 0.3 (*)    All other components within normal limits  GLUCOSE, CAPILLARY - Abnormal; Notable for the following components:   Glucose-Capillary 123 (*)    All other components within normal limits  GLUCOSE, CAPILLARY - Abnormal; Notable for the following components:   Glucose-Capillary 250 (*)    All other components within normal limits  HEPATITIS B SURFACE ANTIBODY, QUANTITATIVE - Abnormal; Notable for the following components:   Hep B S AB Quant (Post) <3.5 (*)    All  other components within normal limits  GLUCOSE, CAPILLARY - Abnormal; Notable for the following components:   Glucose-Capillary 191 (*)    All other components within normal limits  GLUCOSE, CAPILLARY - Abnormal; Notable for the following components:   Glucose-Capillary 177 (*)    All other components within normal limits  RENAL FUNCTION PANEL - Abnormal; Notable for the following components:   Sodium 125 (*)    Chloride 87 (*)    CO2 18 (*)    Glucose, Bld 171 (*)    BUN 49 (*)    Creatinine, Ser 6.31 (*)    Phosphorus 5.1 (*)    Albumin  2.6 (*)    GFR, Estimated 6 (*)    Anion gap 20 (*)    All other components within normal limits  CBC - Abnormal; Notable for the following components:   RBC 3.09 (*)    Hemoglobin 9.9 (*)    HCT 32.1 (*)    MCV 103.9 (*)    RDW 20.0 (*)    nRBC 0.3 (*)    All other components within normal limits  GLUCOSE, CAPILLARY - Abnormal; Notable for the following components:   Glucose-Capillary 253 (*)    All other components within normal limits  GLUCOSE, CAPILLARY - Abnormal; Notable for the following components:   Glucose-Capillary 172 (*)    All other components within normal limits  GLUCOSE, CAPILLARY - Abnormal; Notable for the following components:   Glucose-Capillary 110 (*)    All other components within normal limits  POC OCCULT BLOOD, ED - Abnormal; Notable for the following components:   Fecal Occult Bld POSITIVE (*)    All other components within normal limits  CBG MONITORING, ED - Abnormal; Notable for the following components:   Glucose-Capillary 105 (*)    All other components within normal limits  GLUCOSE, CAPILLARY  HEPATITIS B SURFACE ANTIGEN  TYPE AND SCREEN  PREPARE RBC (CROSSMATCH)    EKG: EKG Interpretation Date/Time:  Thursday May 04 2024 17:41:22 EDT Ventricular Rate:  91 PR Interval:  131 QRS Duration:  104 QT Interval:  414 QTC Calculation: 496 R Axis:   118  Text Interpretation: Sinus arrhythmia Low  voltage, extremity leads Nonspecific repol abnormality, lateral leads similar to previous Confirmed by Bari Flank [phone]) on 05/04/2024 9:20:02 PM  Radiology: No results found.   .Critical Care  Performed by: Bari Flank HERO, DO Authorized by: Bari Flank HERO, DO   Critical care provider statement:    Critical care time (minutes):  30   Critical care time was exclusive of:  Separately billable procedures and treating other patients   Critical care was necessary to treat or prevent imminent or life-threatening deterioration of the following conditions:  Metabolic crisis   Critical care was time spent personally by me on the following activities:  Development of treatment plan with patient or surrogate, discussions with consultants, evaluation of patient's response to  treatment, examination of patient, ordering and review of laboratory studies, ordering and review of radiographic studies, ordering and performing treatments and interventions, pulse oximetry, re-evaluation of patient's condition and review of old charts   I assumed direction of critical care for this patient from another provider in my specialty: no     Care discussed with: admitting provider      Medications Ordered in the ED  albumin  human 25 % solution 25 g (has no administration in time range)  pantoprazole  (PROTONIX ) injection 40 mg (40 mg Intravenous Given 05/04/24 2248)  0.9 %  sodium chloride  infusion (Manually program via Guardrails IV Fluids) (0 mLs Intravenous Stopped 05/05/24 0200)                                    Medical Decision Making Amount and/or Complexity of Data Reviewed Labs: ordered.  Risk Prescription drug management. Decision regarding hospitalization.   83 year old female presents emergency department with concern for bloody bowel movements.  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  Patient at this time not a candidate for scoping with  gastroenterology.  Was on DAPT for critical limb ischemia, however the patient stopped this yesterday after noting the bloody stools.  Now with weakness and fatigue.  Physical exam shows a fatigued female, benign abdomen.  Rectal exam shows frank hematochezia, occult positive.  Blood work is showing anemia, down from her baseline.  Given this downtrending anemia, recently on dual antiplatelet therapy with ongoing hematochezia and age combined with comorbidities patient will require admission for transfusion and further care.  Kidney function baseline, currently receiving hemodialysis as an outpatient and compliant.  Patients evaluation and results requires admission for further treatment and care.  Spoke with hospitalist, reviewed patient's ED course and they accept admission.  Patient agrees with admission plan, offers no new complaints and is stable/unchanged at time of admit.     Final diagnoses:  Anemia, unspecified type  Weakness    ED Discharge Orders          Ordered    acetaminophen  (TYLENOL ) 500 MG tablet  Every 8 hours PRN        05/09/24 1447    cephALEXin  (KEFLEX ) 500 MG capsule  2 times daily        05/09/24 1447    oxyCODONE  (OXY IR/ROXICODONE ) 5 MG immediate release tablet  Every 8 hours PRN        05/09/24 1447    polyethylene glycol powder (GLYCOLAX /MIRALAX ) 17 GM/SCOOP powder  Daily        05/09/24 1447    senna (SENOKOT) 8.6 MG TABS tablet  Daily        05/09/24 1447    Increase activity slowly        05/09/24 1457    Diet - low sodium heart healthy        05/09/24 1457    Discharge wound care:       Comments: RLE ans R heel: Cleanse with vashe #[id number], not rinse. Apply Medihoney daily to the wounds bed (RLE and Heel). Cover with foam dressing changing every 3 days or if is saturated. Keep the leg above the heart level to relieve the edema. The pt cannot be wrapped due the PAD (peripheral artery disease). If need to hold the foam on the heel. Wrap with Kerlix  (gauze bandage) with no pressure, since the base of the toes,  wrapping the heel.   05/09/24 1457               Janyia Guion, Roxie HERO, DO 05/13/24 1516

## 2024-05-15 ENCOUNTER — Inpatient Hospital Stay: Attending: Hematology and Oncology | Admitting: Hematology and Oncology

## 2024-05-15 ENCOUNTER — Inpatient Hospital Stay

## 2024-05-15 ENCOUNTER — Encounter: Payer: Self-pay | Admitting: Hematology and Oncology

## 2024-05-15 ENCOUNTER — Other Ambulatory Visit: Payer: Self-pay | Admitting: Hematology and Oncology

## 2024-05-15 VITALS — BP 123/59 | HR 80 | Temp 97.7°F | Resp 18 | Ht 61.0 in | Wt 156.1 lb

## 2024-05-15 DIAGNOSIS — Z87891 Personal history of nicotine dependence: Secondary | ICD-10-CM | POA: Insufficient documentation

## 2024-05-15 DIAGNOSIS — D500 Iron deficiency anemia secondary to blood loss (chronic): Secondary | ICD-10-CM | POA: Diagnosis present

## 2024-05-15 DIAGNOSIS — D509 Iron deficiency anemia, unspecified: Secondary | ICD-10-CM

## 2024-05-15 DIAGNOSIS — D631 Anemia in chronic kidney disease: Secondary | ICD-10-CM | POA: Insufficient documentation

## 2024-05-15 DIAGNOSIS — Z79899 Other long term (current) drug therapy: Secondary | ICD-10-CM | POA: Insufficient documentation

## 2024-05-15 DIAGNOSIS — D649 Anemia, unspecified: Secondary | ICD-10-CM

## 2024-05-15 DIAGNOSIS — N186 End stage renal disease: Secondary | ICD-10-CM | POA: Diagnosis not present

## 2024-05-15 LAB — CMP (CANCER CENTER ONLY)
ALT: 34 U/L (ref 0–44)
AST: 34 U/L (ref 15–41)
Albumin: 3.4 g/dL — ABNORMAL LOW (ref 3.5–5.0)
Alkaline Phosphatase: 231 U/L — ABNORMAL HIGH (ref 38–126)
Anion gap: 14 (ref 5–15)
BUN: 30 mg/dL — ABNORMAL HIGH (ref 8–23)
CO2: 28 mmol/L (ref 22–32)
Calcium: 9.8 mg/dL (ref 8.9–10.3)
Chloride: 89 mmol/L — ABNORMAL LOW (ref 98–111)
Creatinine: 4.95 mg/dL — ABNORMAL HIGH (ref 0.44–1.00)
GFR, Estimated: 8 mL/min — ABNORMAL LOW (ref >60)
Glucose, Bld: 119 mg/dL — ABNORMAL HIGH (ref 70–99)
Potassium: 4.5 mmol/L (ref 3.5–5.1)
Sodium: 131 mmol/L — ABNORMAL LOW (ref 135–145)
Total Bilirubin: 0.8 mg/dL (ref 0.0–1.2)
Total Protein: 6.7 g/dL (ref 6.5–8.1)

## 2024-05-15 LAB — CBC WITH DIFFERENTIAL (CANCER CENTER ONLY)
Abs Immature Granulocytes: 0 K/uL (ref 0.00–0.07)
Basophils Absolute: 0.1 K/uL (ref 0.0–0.1)
Basophils Relative: 1 %
Eosinophils Absolute: 0.1 K/uL (ref 0.0–0.5)
Eosinophils Relative: 1 %
HCT: 32.7 % — ABNORMAL LOW (ref 36.0–46.0)
Hemoglobin: 10.3 g/dL — ABNORMAL LOW (ref 12.0–15.0)
Immature Granulocytes: 0 %
Lymphocytes Relative: 14 %
Lymphs Abs: 1.1 K/uL (ref 0.7–4.0)
MCH: 31.8 pg (ref 26.0–34.0)
MCHC: 31.5 g/dL (ref 30.0–36.0)
MCV: 100.9 fL — ABNORMAL HIGH (ref 80.0–100.0)
Monocytes Absolute: 0.8 K/uL (ref 0.1–1.0)
Monocytes Relative: 11 %
Neutro Abs: 5.4 K/uL (ref 1.7–7.7)
Neutrophils Relative %: 73 %
Platelet Count: 138 K/uL — ABNORMAL LOW (ref 150–400)
RBC: 3.24 MIL/uL — ABNORMAL LOW (ref 3.87–5.11)
RDW: 20.2 % — ABNORMAL HIGH (ref 11.5–15.5)
Smear Review: NORMAL
WBC Count: 7.5 K/uL (ref 4.0–10.5)
nRBC: 0 % (ref 0.0–0.2)

## 2024-05-15 LAB — FOLATE: Folate: 7.2 ng/mL (ref >5.9)

## 2024-05-15 LAB — IRON AND TIBC
Iron: 33 ug/dL (ref 28–170)
Saturation Ratios: 12 % (ref 10.4–31.8)
TIBC: 287 ug/dL (ref 250–450)
UIBC: 254 ug/dL

## 2024-05-15 LAB — TSH: TSH: 22.316 u[IU]/mL — ABNORMAL HIGH (ref 0.350–4.500)

## 2024-05-15 LAB — VITAMIN B12: Vitamin B-12: 5873 pg/mL — ABNORMAL HIGH (ref 180–914)

## 2024-05-15 LAB — FERRITIN: Ferritin: 1205 ng/mL — ABNORMAL HIGH (ref 11–307)

## 2024-05-15 MED ORDER — ONDANSETRON HCL 8 MG PO TABS: 8.0000 mg | ORAL_TABLET | Freq: Three times a day (TID) | ORAL | 3 refills | Status: DC | PRN

## 2024-05-15 NOTE — Progress Notes (Signed)
 [facility] [address] [place],  [place]  [address] [phone]  Clinic Day:  06/01/2024   Referring physician: Thurmond Cathlyn LABOR., MD  Patient Care Team: Patient Care Team: Thurmond Cathlyn LABOR., MD as PCP - General (Internal Medicine) Bernie Lamar PARAS, MD as PCP - Cardiology (Cardiology) Center, [facility] Dorn PARAS, MD as Consulting Physician (Cardiology) Carlin Delon BROCKS, NP as Nurse Practitioner (Cardiology)   REASON FOR CONSULTATION:  Anemia  HISTORY OF PRESENT ILLNESS:   Anna Carr is a 83 y.o. female with a history of anemia who is referred in consultation by Cathlyn Bread, MD for assessment and management. She has been admitted on several occasions for GI bleed resulting in anemia. She presents today for complete workup. She reports mainly fatigue and weakness. Her medical history is significant and includes anemia, GI bleed, CKD, angiodysplasia, aortic stenosis, AVM of the duodenum, cardiac arrest, CHF, gallstones, depression, hypertension, osteoarthritis and Type 2 DM. Surgical history includes AV fistulogram, appendectomy, balloon enteroscopy, colonoscopy, enteroscopy, EGD, peripheral vascular thrombectomy and polypectomy. Family history includes diabetes, hypertension, and liver disease. She denies fever, chills, nausea or vomiting. She notes shortness of breath, but denies chest pain or cough. She denies issue with bowel or bladder. She lives in a nursing facility. She is a widow with 3 children. She is a former smoker, having quit in 1983. She denies use of alcohol  or other drugs.    REVIEW OF SYSTEMS:   Review of Systems  Constitutional:  Positive for fatigue.  HENT:  Negative.    Eyes: Negative.   Respiratory:  Positive for shortness of breath.   Cardiovascular: Negative.   Gastrointestinal:  Positive for constipation and diarrhea.  Endocrine: Negative.   Genitourinary: Negative.    Skin: Negative.   Neurological:  Positive for  extremity weakness.  Hematological: Negative.   Psychiatric/Behavioral: Negative.       VITALS:   Blood pressure (!) 123/59, pulse 80, temperature 97.7 F (36.5 C), temperature source Oral, resp. rate 18, height 5' 1 (1.549 m), weight 156 lb 1.6 oz (70.8 kg), SpO2 96%.  Wt Readings from Last 3 Encounters:  05/18/24 159 lb (72.1 kg)  05/15/24 156 lb 1.6 oz (70.8 kg)  05/10/24 158 lb (71.7 kg)    Body mass index is 29.49 kg/m.  Performance status (ECOG): 2 - Symptomatic, <50% confined to bed  PHYSICAL EXAM:   Physical Exam Constitutional:      Appearance: Normal appearance. She is normal weight.  HENT:     Head: Normocephalic and atraumatic.     Mouth/Throat:     Mouth: Mucous membranes are moist.  Cardiovascular:     Rate and Rhythm: Normal rate and regular rhythm.     Pulses: Normal pulses.     Heart sounds: Normal heart sounds.  Pulmonary:     Effort: Pulmonary effort is normal.     Breath sounds: Normal breath sounds.  Abdominal:     Palpations: Abdomen is soft.  Musculoskeletal:        General: Normal range of motion.     Cervical back: Normal range of motion.  Skin:    General: Skin is warm and dry.  Neurological:     General: No focal deficit present.     Mental Status: She is alert and oriented to person, place, and time. Mental status is at baseline.  Psychiatric:        Mood and Affect: Mood normal.  Behavior: Behavior normal.        Thought Content: Thought content normal.        Judgment: Judgment normal.      LABS:      Latest Ref Rng & Units 05/18/2024   11:51 PM 05/15/2024    8:23 AM 05/09/2024    5:36 AM  CBC  WBC 4.0 - 10.5 K/uL 7.9  7.5  9.4   Hemoglobin 12.0 - 15.0 g/dL 9.3  89.6  9.9   Hematocrit 36.0 - 46.0 % 29.9  32.7  32.1   Platelets 150 - 400 K/uL 156  138  162       Latest Ref Rng & Units 05/15/2024    8:23 AM 05/09/2024    5:36 AM 05/08/2024    5:29 AM  CMP  Glucose 70 - 99 mg/dL 880  828  883   BUN 8 - 23 mg/dL 30   49  38   Creatinine 0.44 - 1.00 mg/dL 5.04  3.68  4.56   Sodium 135 - 145 mmol/L 131  125  128   Potassium 3.5 - 5.1 mmol/L 4.5  5.1  5.1   Chloride 98 - 111 mmol/L 89  87  90   CO2 22 - 32 mmol/L 28  18  21    Calcium  8.9 - 10.3 mg/dL 9.8  9.0  8.9   Total Protein 6.5 - 8.1 g/dL 6.7     Total Bilirubin 0.0 - 1.2 mg/dL 0.8     Alkaline Phos 38 - 126 U/L 231     AST 15 - 41 U/L 34     ALT 0 - 44 U/L 34        No results found for: CEA1, CEA / No results found for: CEA1, CEA No results found for: PSA1 No results found for: CAN199 No results found for: RJW874  Lab Results  Component Value Date   TOTALPROTELP 5.8 (L) 05/15/2024   Lab Results  Component Value Date   TIBC 287 05/15/2024   TIBC 283 04/27/2024   TIBC 220 (L) 04/10/2024   FERRITIN 1,205 (H) 05/15/2024   FERRITIN 1,188 (H) 04/27/2024   FERRITIN 1,300 (H) 04/10/2024   IRONPCTSAT 12 05/15/2024   IRONPCTSAT 26 04/27/2024   IRONPCTSAT 13 04/10/2024   Lab Results  Component Value Date   LDH 241 (H) 04/11/2024    STUDIES:   DG Chest Portable 1 View Result Date: 05/18/2024 CLINICAL DATA:  Line placement. EXAM: PORTABLE CHEST 1 VIEW COMPARISON:  April 23, 2024 FINDINGS: A right-sided venous catheter is seen with its distal tip approximally 1.8 cm distal to the junction of the superior vena cava and right atrium. This may be, in part, related to technique. The cardiac silhouette is enlarged and unchanged in size. There is marked severity calcification of the thoracic aorta. Prominence of the pulmonary vasculature is present. There is mild bibasilar atelectasis and/or infiltrate. Small bilateral pleural effusions are noted, right greater than left. No pneumothorax is identified. The visualized skeletal structures are unremarkable. IMPRESSION: 1. Right-sided venous catheter positioning, as described above. 2. Cardiomegaly with mild pulmonary vascular congestion. 3. Mild bibasilar atelectasis and/or infiltrate. 4.  Small bilateral pleural effusions, right greater than left. Electronically Signed   By: Suzen Dials M.D.   On: 05/18/2024 20:31   PERIPHERAL VASCULAR CATHETERIZATION Result Date: 05/18/2024 Images from the original result were not included. 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  She underwent thrombectomy of her left arm AV graft yesterday.  On completion pictures the graft was widely patent although there was sluggish flow in the central system.  This was thought to be due to moderate to severe tricuspid regurgitation as multiple views were obtained which did not demonstrate any central stenosis or clot.  We got called today that her graft was rethrombosed and she presents to the access center today for placement of a tunneled access catheter.  There are some benefits were reviewed and she elected to proceed. Findings: Widely patent and compressible right internal jugular vein Successful placement of tunneled dialysis catheter at atriocaval junction  Procedure:  The patient was identified in the holding area and taken to the cath lab  The patient was then placed supine on the table and prepped and draped in the usual sterile fashion.  A time out was called.  Using ultrasound guidance the right internal jugular vein was accessed with micropuncture technique.  Through the micropuncture sheath, the guidewire was advanced into the superior vena cava.  A small incision was made around the skin access point.  The access point was serially dilated under direct fluoroscopic guidance.  A peel-away sheath was  introduced into the superior vena cava under fluoroscopic guidance.  A counterincision was made in the chest under the clavicle.  A 19 cm tunnel dialysis catheter was then tunneled under the skin, over the clavicle into the incision in the neck.  The tunneling device was removed and the catheter fed through the peel-away sheath into the superior vena cava.  The peel-away sheath was removed and the catheter gently pulled back.  Adequate position was confirmed with x-ray.  The catheter was tested and found to aspirate and flush with ease.    The catheter was sutured to the skin and the neck incision was closed with 4-0 Monocryl.  The catheter was then capped and heparin  locked. Impression: Successful placement of 19 cm dialysis catheter at atriocaval junction Norman GORMAN Serve MD [facility] [place] Office: [phone]   PERIPHERAL VASCULAR CATHETERIZATION Result Date: 05/17/2024 Images from the original result were not included. 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 Chalupa is a 83 year old female with ESRD who had a brachiocephalic fistula revision with interposition 4-7 AVG in 2022.  She presented to the HD access center today due to a clotted graft.  She last underwent dialysis on Saturday.  Risks and benefits of thrombectomy and possible catheter placement were reviewed and she elected to  proceed. Findings: Widely patent central venous system although stagnant flow likely due to her severe tricuspid regurg. Complete thrombosis of the left arm AVG with a severe 80% stenosis  in the mid graft. After mechanical thrombectomy and balloon angioplasty there was widely patent brisk flow through the AVG with pulsatile inflow, the flow in the chest was somewhat stagnant until flow likely due to her valvular issues.  Procedure:  The patient was identified in the holding area and taken to the cath lab  The patient was then placed supine on the table and prepped and draped in the usual sterile fashion.  A time out was called.  Ultrasound was used to evaluate the left arm AV access. This was accessed under u/s guidance and an antegrade fashion. An 018 wire was advanced without resistance, a micropuncture sheath was placed and a Bentson wire was placed through the thrombosed graft and into the central system.  The access was then upsized over this wire to a 7 [place] sheath and a 7 [place] guide cath was placed over this wire and into the central system.  A central venogram was then obtained which demonstrated wide patency although some stagnant flow in the subclavian and innominate veins.  I then performed 2 passes of a 7 French glide cath under aspiration to aspirate any acute thrombus.  The wire was then replaced into the central system and the entire graft was ballooned with a 7 mm Mustang balloon.  A fistulogram was then obtained which demonstrated a significant stenosis in the mid graft and this was reballooned with the 7 mm Mustang at a higher pressure.  Ultrasound was then used to evaluate the graft in the proximal upper arm.  The skin was anesthetized and this was accessed under ultrasound guidance in a retrograde fashion.  The 018 wire was advanced without resistance and a micropuncture sheath was placed.  A Glidewire was then placed through the micropuncture sheath and this access was upsized to a 6 [place]  sheath.  Using the Glidewire was able to cross the arterial anastomosis and the wire was passed down into the radial artery.  Over this wire a 5 mm Mustang balloon was tracked into the brachial artery, inflated to about 4 atm and gently pulled  back to pull the arterial plug.  This was performed twice which restored pulsatile inflow into the graft.  An angiogram demonstrated excellent inflow and preserved flow into the radial and ulnar arteries.  A fistulogram demonstrated brisk flow through the graft although stagnant to and fro flow in the central system.  It was unclear whether there was a stenosis in the subclavian vein at the first rib and therefore I elected to treat this area with a 12 mm x 40 mm Mustang balloon.  I then obtained multiple central venograms through the catheter which demonstrated wide patency of the central veins although there was some stagnant flow which was likely due to her severe valvular issues.  Both wires and sheaths were removed and 4-0 Monocryl figure-of-eight sutures were placed for hemostasis. Contrast: 60 cc Impression: Successful recanalization of the left arm AV graft.  A severe mid graft stenosis was treated to less than 30% residual stenosis.  There is stagnant, to and fro flow in the central system likely due to her severe tricuspid regurgitation. Norman GORMAN Serve MD [facility] [place] Office: [phone]      HISTORY:   Past Medical History:  Diagnosis Date   Acute on chronic anemia 02/25/2024   Acute pain of left shoulder 03/05/2024   Acute upper GI bleed 03/03/2024   Anemia due to chronic blood loss    Anemia in chronic  kidney disease 01/23/2017   Anemia, chronic disease 01/23/2017   Angiodysplasia of small intestine 02/25/2024   Aortic stenosis moderate 2024 06/21/2023   Arteriovenous fistula, acquired ([name]) 11/02/2019   AV fistula ([name])    AVM (arteriovenous malformation)    AVM (arteriovenous malformation) of duodenum,  acquired 03/03/2024   AVM (arteriovenous malformation) of duodenum, acquired with hemorrhage 10/30/2021   AVM (arteriovenous malformation) of small bowel, acquired with hemorrhage    C. difficile colitis 03/30/2021   Cardiac arrest ([name]) 03/02/2024   Cellulitis 03/16/2021   CHF with left ventricular diastolic dysfunction, NYHA class 1 ([name]) 02/02/2017   Cholelithiasis 03/30/2021   Chronic constipation 12/22/2015   Chronic laryngitis 02/17/2017   Last Assessment & Plan:   Formatting of this note might be different from the original.  Evaluation of larynx.  Was hospitalized for pneumonia and during that time she started noticing worsening hoarseness.  Denies any throat pain.  No obvious heartburn.  She did recently start PPI therapy.  She smoked in the past.  Denies any difficulty swallowing.  Hoarseness is improving.  EXAM shows mildly ras   Coagulation defect, unspecified ([name]) 12/07/2019   Colon polyp    Complication of vascular dialysis catheter 03/05/2020   Contact with and (suspected) exposure to covid-19 10/28/2020   COPD mixed type ([name])    Cough, unspecified 10/28/2020   COVID-19 10/29/2020   Depression    Diverticulosis of large intestine without perforation or abscess without bleeding 01/08/2020   Dyspnea on exertion    Dyspnea, unspecified 11/02/2019   Elevated troponin 03/16/2021   Encounter for removal of sutures 12/28/2019   Encounter for screening for respiratory tuberculosis 12/06/2019   ESRD (end stage renal disease) ([name]) 07/18/2019   ESRD on dialysis ([name]) 09/18/2019   ESRD on hemodialysis ([name]) 01/02/2020   Fever, unspecified 01/23/2020   GI bleed 12/19/2021   GIB (gastrointestinal bleeding) 01/02/2020   Heme positive stool    High risk medication use 12/22/2015   History of blood transfusion    History of colonic polyps 01/08/2020   HLD (hyperlipidemia) 12/22/2015   Hypertension    Hypervolemia 11/13/2019   Hypokalemia 03/11/2019   Hyponatremia 03/11/2019    Hypovolemia 10/27/2021   Hypoxia 03/11/2019   Insomnia    Insulin  long-term use ([name]) 12/22/2015   Iron  deficiency anemia, unspecified 12/06/2019   Major depressive disorder, recurrent ([name]) 12/22/2015   Malaise and fatigue 12/23/2015   Mild protein-calorie malnutrition ([name]) 03/26/2020   Mitral regurgitation moderate to severe based on echocardiogram from 2022 11/03/2021   Musculoskeletal chest pain 03/03/2024   Osteoarthritis    Osteopenia    Other disorders of phosphorus metabolism 01/08/2020   PAC (premature atrial contraction) 10/04/2020   Pain, unspecified 12/06/2019   PAT (paroxysmal atrial tachycardia) ([name]) 10/04/2020   Peripheral vascular disease, unspecified ([name]) critical stenosis lower extremity 12/01/2023   Peripheral arterial disease     Pneumonia    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  Chodri CT annually.     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 dialysis, with long-term current use of insulin  ([name]) 12/23/2015   Type 2 diabetes mellitus, with long-term current use of insulin  ([name]) 03/11/2019   Unspecified hemorrhoids 01/08/2020  Past Surgical History:  Procedure Laterality Date   A/V FISTULAGRAM Left 02/17/2021   Procedure: A/V FISTULAGRAM;  Surgeon: Sheree Penne Bruckner, MD;  Location[facility] INVASIVE CV LAB;  Service: Cardiovascular;  Laterality: Left;   APPENDECTOMY     AV FISTULA PLACEMENT Left    AV FISTULA PLACEMENT Left 03/12/2021   Procedure: INSERTION OF LEFT UPPER ARM ARTERIOVENOUS (AV) GORE-TEX GRAFT;  Surgeon: Sheree Penne Bruckner, MD;  Location: [facility] OR;  Service: Vascular;  Laterality: Left;   BALLOON  ENTEROSCOPY  03/02/2024   Procedure: ENTEROSCOPY, USING BALLOON;  Surgeon: San Sandor GAILS, DO;  Location: [facility] ENDOSCOPY;  Service: Gastroenterology;;   COLONOSCOPY WITH PROPOFOL  N/A 03/18/2021   Procedure: COLONOSCOPY WITH PROPOFOL ;  Surgeon: Legrand Victory LITTIE DOUGLAS, MD;  Location: [facility] ENDOSCOPY;  Service: Gastroenterology;  Laterality: N/A;   CYSTECTOMY     DIALYSIS/PERMA CATHETER INSERTION N/A 05/18/2024   Procedure: DIALYSIS/PERMA CATHETER INSERTION;  Surgeon: Pearline Norman RAMAN, MD;  Location: [facility] PV LAB;  Service: Cardiovascular;  Laterality: N/A;   ENTEROSCOPY N/A 03/21/2021   Procedure: ENTEROSCOPY;  Surgeon: Legrand Victory LITTIE DOUGLAS, MD;  Location: [facility] ENDOSCOPY;  Service: Gastroenterology;  Laterality: N/A;   ENTEROSCOPY N/A 12/20/2021   Procedure: ENTEROSCOPY;  Surgeon: Legrand Victory LITTIE DOUGLAS, MD;  Location: [facility] ENDOSCOPY;  Service: Gastroenterology;  Laterality: N/A;   ENTEROSCOPY N/A 02/26/2024   Procedure: ENTEROSCOPY;  Surgeon: Albertus Gordy HERO, MD;  Location: [facility] ENDOSCOPY;  Service: Gastroenterology;  Laterality: N/A;   ENTEROSCOPY N/A 02/28/2024   Procedure: ENTEROSCOPY;  Surgeon: San Sandor GAILS, DO;  Location: [facility] ENDOSCOPY;  Service: Gastroenterology;  Laterality: N/A;   ESOPHAGOGASTRODUODENOSCOPY (EGD) WITH PROPOFOL  N/A 03/18/2021   Procedure: ESOPHAGOGASTRODUODENOSCOPY (EGD) WITH PROPOFOL ;  Surgeon: Legrand Victory LITTIE DOUGLAS, MD;  Location: [facility] ENDOSCOPY;  Service: Gastroenterology;  Laterality: N/A;   GIVENS CAPSULE STUDY N/A 03/19/2021   Procedure: GIVENS CAPSULE STUDY;  Surgeon: Legrand Victory LITTIE DOUGLAS, MD;  Location: [facility] ENDOSCOPY;  Service: Gastroenterology;  Laterality: N/A;   GIVENS CAPSULE STUDY N/A 02/29/2024   Procedure: IMAGING PROCEDURE, GI TRACT, INTRALUMINAL, VIA CAPSULE;  Surgeon: San Sandor GAILS, DO;  Location: [facility] ENDOSCOPY;  Service: Gastroenterology;  Laterality: N/A;   HEMOSTASIS CLIP PLACEMENT  12/20/2021   Procedure: HEMOSTASIS CLIP PLACEMENT;  Surgeon: Legrand Victory LITTIE DOUGLAS, MD;  Location: [facility]  ENDOSCOPY;  Service: Gastroenterology;;   HOT HEMOSTASIS N/A 03/21/2021   Procedure: HOT HEMOSTASIS (ARGON PLASMA COAGULATION/BICAP);  Surgeon: Legrand Victory LITTIE DOUGLAS, MD;  Location: [facility] ENDOSCOPY;  Service: Gastroenterology;  Laterality: N/A;   HOT HEMOSTASIS N/A 12/20/2021   Procedure: HOT HEMOSTASIS (ARGON PLASMA COAGULATION/BICAP);  Surgeon: Legrand Victory LITTIE DOUGLAS, MD;  Location: [facility] ENDOSCOPY;  Service: Gastroenterology;  Laterality: N/A;   HOT HEMOSTASIS N/A 03/02/2024   Procedure: EGD, WITH ARGON PLASMA COAGULATION;  Surgeon: San Sandor GAILS, DO;  Location: [facility] ENDOSCOPY;  Service: Gastroenterology;  Laterality: N/A;   HYSTEROSCOPY     IR THROMBECTOMY AV FISTULA W/THROMBOLYSIS/PTA INC/SHUNT/IMG LEFT Left 04/12/2024   IR US  GUIDE VASC ACCESS LEFT  04/12/2024   LOWER EXTREMITY ANGIOGRAPHY N/A 02/03/2024   Procedure: Lower Extremity Angiography;  Surgeon: Court Dorn PARAS, MD;  Location: [facility] INVASIVE CV LAB;  Service: Cardiovascular;  Laterality: N/A;   PERIPHERAL VASCULAR THROMBECTOMY Left 05/17/2024   Procedure: PERIPHERAL VASCULAR THROMBECTOMY;  Surgeon: Pearline Norman RAMAN, MD;  Location: [facility] PV LAB;  Service: Cardiovascular;  Laterality: Left;   POLYPECTOMY  03/18/2021   Procedure: POLYPECTOMY;  Surgeon: Legrand Victory LITTIE DOUGLAS, MD;  Location: [facility] ENDOSCOPY;  Service:  Gastroenterology;;    Family History  Problem Relation Age of Onset   Diabetes Mother    Hypertension Mother    Diabetes Maternal Grandfather    Liver disease Other     Social History:  reports that she quit smoking about 42 years ago. Her smoking use included cigarettes. She has never used smokeless tobacco. She reports that she does not currently use alcohol . She reports that she does not use drugs.The patient is accompanied by daughter today.  Allergies:  Allergies  Allergen Reactions   Atorvastatin  Other (See Comments)    Constipation   Wound Dressing Adhesive Rash    Current Medications: Current Outpatient Medications   Medication Sig Dispense Refill   Methoxy PEG-Epoetin Beta (MIRCERA IJ) 225 mcg.     ondansetron  (ZOFRAN ) 8 MG tablet Take 1 tablet (8 mg total) by mouth every 8 (eight) hours as needed for nausea. 30 tablet 3   acetaminophen  (TYLENOL ) 500 MG tablet Take 2 tablets (1,000 mg total) by mouth every 8 (eight) hours as needed for mild pain (pain score 1-3) or moderate pain (pain score 4-6).     ascorbic acid  (VITAMIN C ) 1000 MG tablet Take 1,000 mg by mouth at bedtime.     Biotin  5000 MCG TABS Take 5,000 mcg by mouth daily in the afternoon.     camphor-menthol  (SARNA) lotion Apply 1 Application topically as needed for itching. 222 mL 0   carboxymethylcellulose (REFRESH PLUS) 0.5 % SOLN Place 1 drop into both eyes 3 (three) times daily as needed (dry eyes).     cyanocobalamin  (VITAMIN B12) 1000 MCG tablet Take 1,000 mcg by mouth every evening.     ferric citrate  (AURYXIA ) 1 GM 210 MG(Fe) tablet Take 210 mg by mouth 3 (three) times daily with meals.     furosemide  (LASIX ) 80 MG tablet Take 80 mg by mouth daily in the afternoon.     Insulin  Glargine-Lixisenatide  (SOLIQUA ) 100-33 UNT-MCG/ML SOPN Inject 6 Units into the skin in the morning.     Lactobacillus (PROBIOTIC ACIDOPHILUS PO) Take 1 capsule by mouth in the morning.     lidocaine -prilocaine  (EMLA ) cream Apply 1 application topically as needed (prior to fisula access). Tuesday, Thursday, Saturday for dialysis     midodrine  (PROAMATINE ) 10 MG tablet Take 10 mg by mouth 3 (three) times daily.     Omega-3 Fatty Acids (FISH OIL PO) Take 1 capsule by mouth in the morning and at bedtime.     ondansetron  (ZOFRAN -ODT) 4 MG disintegrating tablet Take 4 mg by mouth every 8 (eight) hours as needed.     pantoprazole  (PROTONIX ) 40 MG tablet Take 1 tablet (40 mg total) by mouth 2 (two) times daily. 60 tablet 0   polyethylene glycol powder (GLYCOLAX /MIRALAX ) 17 GM/SCOOP powder Take 17 g by mouth daily. 238 g 0   rosuvastatin  (CRESTOR ) 10 MG tablet Take 1 tablet  (10 mg total) by mouth daily. 30 tablet 0   senna (SENOKOT) 8.6 MG TABS tablet Take 1 tablet (8.6 mg total) by mouth daily. 30 tablet 0   traMADol  (ULTRAM ) 50 MG tablet Take 1 tablet (50 mg total) by mouth every 6 (six) hours as needed. 90 tablet 0   Vitamin E  670 MG (1000 UT) CAPS Take 1,000 Units by mouth in the morning.     Zinc  50 MG TABS Take 50 mg by mouth daily.     No current facility-administered medications for this visit.     ASSESSMENT & PLAN:   Assessment:  Anna  SUZZANNE Carr is a 83 y.o. female with long standing history of anemia due to chronic blood loss in the form of GI bleed. She has been hospitalized several times for this. Today, her most concerning symptom is fatigue and weakness. She does have shortness of breath upon exertion. We discussed the reason for her anemia is most likely related to her chronic blood loss. She denies any active bleeding. CBC today reveals hemoglobin 9.3. Iron  studies reveal saturation of 12. We discussed IV iron  and the pros and cons including possible infusion reactions. She is agreeable to the plan.   Plan: 1.  We will plan for IV Feraheme . She will return to clinic 4 weeks after her last infusion for repeat evaluation.   I discussed the assessment and treatment plan with the patient.  The patient was provided an opportunity to ask questions and all were answered.  The patient agreed with the plan and demonstrated an understanding of the instructions.    Thank you for the referral    45 minutes was spent in patient care.  This included time spent preparing to see the patient (e.g., review of tests), obtaining and/or reviewing separately obtained history, counseling and educating the patient/family/caregiver, ordering medications, tests, or procedures; documenting clinical information in the electronic or other health record, independently interpreting results and communicating results to the patient/family/caregiver as well as coordination of  care.      Eleanor DELENA Bach, NP   Family Nurse Practitioner - Board Certified [facility] [phone]

## 2024-05-15 NOTE — Telephone Encounter (Signed)
 Recommendations reviewed with Verneita per DPR as per Delon Sexton note. Verneita verbalized understanding and had no additional questions.

## 2024-05-17 ENCOUNTER — Encounter (HOSPITAL_COMMUNITY): Payer: Self-pay | Admitting: Vascular Surgery

## 2024-05-17 ENCOUNTER — Ambulatory Visit (HOSPITAL_COMMUNITY)
Admission: RE | Admit: 2024-05-17 | Discharge: 2024-05-17 | Disposition: A | Discharge status: Home / Self Care | Attending: Vascular Surgery | Admitting: Vascular Surgery

## 2024-05-17 ENCOUNTER — Other Ambulatory Visit: Payer: Self-pay

## 2024-05-17 ENCOUNTER — Encounter (HOSPITAL_COMMUNITY)
Admission: RE | Disposition: A | Discharge status: Home / Self Care | Payer: Self-pay | Source: Home / Self Care | Attending: Vascular Surgery

## 2024-05-17 DIAGNOSIS — E1122 Type 2 diabetes mellitus with diabetic chronic kidney disease: Secondary | ICD-10-CM | POA: Diagnosis not present

## 2024-05-17 DIAGNOSIS — E1151 Type 2 diabetes mellitus with diabetic peripheral angiopathy without gangrene: Secondary | ICD-10-CM | POA: Diagnosis not present

## 2024-05-17 DIAGNOSIS — T82868A Thrombosis of vascular prosthetic devices, implants and grafts, initial encounter: Secondary | ICD-10-CM | POA: Diagnosis not present

## 2024-05-17 DIAGNOSIS — I361 Nonrheumatic tricuspid (valve) insufficiency: Secondary | ICD-10-CM

## 2024-05-17 DIAGNOSIS — Y832 Surgical operation with anastomosis, bypass or graft as the cause of abnormal reaction of the patient, or of later complication, without mention of misadventure at the time of the procedure: Secondary | ICD-10-CM | POA: Insufficient documentation

## 2024-05-17 DIAGNOSIS — N186 End stage renal disease: Secondary | ICD-10-CM | POA: Diagnosis not present

## 2024-05-17 DIAGNOSIS — I132 Hypertensive heart and chronic kidney disease with heart failure and with stage 5 chronic kidney disease, or end stage renal disease: Secondary | ICD-10-CM | POA: Insufficient documentation

## 2024-05-17 DIAGNOSIS — T82858A Stenosis of vascular prosthetic devices, implants and grafts, initial encounter: Secondary | ICD-10-CM

## 2024-05-17 DIAGNOSIS — I509 Heart failure, unspecified: Secondary | ICD-10-CM | POA: Diagnosis not present

## 2024-05-17 DIAGNOSIS — Z992 Dependence on renal dialysis: Secondary | ICD-10-CM

## 2024-05-17 HISTORY — PX: PERIPHERAL VASCULAR THROMBECTOMY: CATH118306

## 2024-05-17 LAB — MULTIPLE MYELOMA PANEL, SERUM
Albumin SerPl Elph-Mcnc: 3.2 g/dL (ref 2.9–4.4)
Albumin/Glob SerPl: 1.3 (ref 0.7–1.7)
Alpha 1: 0.4 g/dL (ref 0.0–0.4)
Alpha2 Glob SerPl Elph-Mcnc: 0.6 g/dL (ref 0.4–1.0)
B-Globulin SerPl Elph-Mcnc: 0.7 g/dL (ref 0.7–1.3)
Gamma Glob SerPl Elph-Mcnc: 0.9 g/dL (ref 0.4–1.8)
Globulin, Total: 2.6 g/dL (ref 2.2–3.9)
IgA: 188 mg/dL (ref 64–422)
IgG (Immunoglobin G), Serum: 1021 mg/dL (ref 586–1602)
IgM (Immunoglobulin M), Srm: 88 mg/dL (ref 26–217)
Total Protein ELP: 5.8 g/dL — ABNORMAL LOW (ref 6.0–8.5)

## 2024-05-17 LAB — GLUCOSE, CAPILLARY: Glucose-Capillary: 116 mg/dL — ABNORMAL HIGH (ref 70–99)

## 2024-05-17 SURGERY — PERIPHERAL VASCULAR THROMBECTOMY
Anesthesia: LOCAL | Site: Arm Upper | Laterality: Left

## 2024-05-17 MED ORDER — LIDOCAINE HCL (PF) 1 % IJ SOLN
INTRAMUSCULAR | Status: DC | PRN
  Administered 2024-05-17 (×2): 2 mL via INTRADERMAL

## 2024-05-17 MED ORDER — MIDAZOLAM HCL 2 MG/2ML IJ SOLN
INTRAMUSCULAR | Status: AC
Start: 2024-05-17 — End: 2024-05-17
  Filled 2024-05-17: qty 2

## 2024-05-17 MED ORDER — LIDOCAINE HCL (PF) 1 % IJ SOLN
INTRAMUSCULAR | Status: AC
  Filled 2024-05-17: qty 30

## 2024-05-17 MED ORDER — IODIXANOL 320 MG/ML IV SOLN
INTRAVENOUS | Status: DC | PRN
Start: 2024-05-17 — End: 2024-05-17
  Administered 2024-05-17: 60 mL

## 2024-05-17 MED ORDER — HEPARIN (PORCINE) IN NACL 1000-0.9 UT/500ML-% IV SOLN
INTRAVENOUS | Status: DC | PRN
  Administered 2024-05-17 (×2): 500 mL

## 2024-05-17 MED ORDER — FENTANYL CITRATE (PF) 100 MCG/2ML IJ SOLN
INTRAMUSCULAR | Status: AC
  Filled 2024-05-17: qty 2

## 2024-05-17 MED ORDER — HEPARIN SODIUM (PORCINE) 1000 UNIT/ML IJ SOLN
INTRAMUSCULAR | Status: DC | PRN
  Administered 2024-05-17: 7000 [IU] via INTRAVENOUS

## 2024-05-17 MED ORDER — HEPARIN SODIUM (PORCINE) 1000 UNIT/ML IJ SOLN
INTRAMUSCULAR | Status: AC
  Filled 2024-05-17: qty 10

## 2024-05-17 SURGICAL SUPPLY — 13 items
BALLOON MUSTANG 12.0X40 75 (BALLOONS) IMPLANT
BALLOON MUSTANG 5.0X40 75 (BALLOONS) IMPLANT
BALLOON MUSTANG 7X100X75 (BALLOONS) IMPLANT
CATH STR 7FR 55 BRITE (CATHETERS) IMPLANT
DEVICE TORQUE SEADRAGON GRN (MISCELLANEOUS) IMPLANT
GUIDEWIRE ANGLED .035 180CM (WIRE) IMPLANT
KIT MICROPUNCTURE NIT STIFF (SHEATH) IMPLANT
MAT PREVALON FULL STRYKER (MISCELLANEOUS) IMPLANT
SHEATH PINNACLE R/O II 6F 4CM (SHEATH) IMPLANT
SHEATH PINNACLE R/O II 7F 4CM (SHEATH) IMPLANT
SHEATH PROBE COVER 6X72 (BAG) IMPLANT
TUBING CIL FLEX 10 FLL-RA (TUBING) IMPLANT
WIRE BENTSON .035X145CM (WIRE) IMPLANT

## 2024-05-17 NOTE — H&P (Signed)
 HD ACCESS CENTER H&P   Patient ID: DENIAH SAIA, female   DOB: 03/28/1941, 83 y.o.   MRN: [id number]  Subjective:     HPI LEVORA WERDEN is a 83 y.o. female with ESRD presenting to the HD access center for intervention.  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 difficile colitis 03/30/2021   Cardiac arrest ([name]) 03/02/2024   Cellulitis 03/16/2021   CHF with left ventricular diastolic dysfunction, NYHA class 1 ([name]) 02/02/2017   Cholelithiasis 03/30/2021   Chronic constipation 12/22/2015   Chronic laryngitis 02/17/2017   Last Assessment & Plan:   Formatting of this note might be different from the original.  Evaluation of larynx.  Was hospitalized for pneumonia and during that time she started noticing worsening hoarseness.  Denies any throat pain.  No obvious heartburn.  She did recently start PPI therapy.  She smoked in the past.  Denies any difficulty swallowing.  Hoarseness is improving.  EXAM shows mildly ras   Coagulation defect, unspecified ([name]) 12/07/2019   Colon polyp    Complication of vascular dialysis catheter 03/05/2020   Contact with and (suspected) exposure to covid-19 10/28/2020   COPD mixed type ([name])    Cough, unspecified 10/28/2020   COVID-19 10/29/2020   Depression    Diverticulosis of large  intestine without perforation or abscess without bleeding 01/08/2020   Dyspnea on exertion    Dyspnea, unspecified 11/02/2019   Elevated troponin 03/16/2021   Encounter for removal of sutures 12/28/2019   Encounter for screening for respiratory tuberculosis 12/06/2019   ESRD (end stage renal disease) ([name]) 07/18/2019   ESRD on dialysis ([name]) 09/18/2019   ESRD on hemodialysis ([name]) 01/02/2020   Fever, unspecified 01/23/2020   GI bleed 12/19/2021   GIB (gastrointestinal bleeding) 01/02/2020   Heme positive stool    High risk medication use 12/22/2015   History of blood transfusion    History of colonic polyps 01/08/2020   HLD (hyperlipidemia) 12/22/2015   Hypertension    Hypervolemia 11/13/2019   Hypokalemia 03/11/2019   Hyponatremia 03/11/2019   Hypovolemia 10/27/2021   Hypoxia 03/11/2019   Insomnia    Insulin  long-term use ([name]) 12/22/2015   Iron  deficiency anemia, unspecified 12/06/2019   Major depressive disorder, recurrent ([name]) 12/22/2015   Malaise and fatigue 12/23/2015   Mild protein-calorie malnutrition ([name]) 03/26/2020   Mitral regurgitation moderate to severe based on echocardiogram from 2022 11/03/2021   Musculoskeletal chest pain 03/03/2024   Osteoarthritis    Osteopenia    Other disorders of phosphorus metabolism 01/08/2020   PAC (premature atrial contraction) 10/04/2020   Pain, unspecified 12/06/2019   PAT (paroxysmal atrial tachycardia) ([name]) 10/04/2020   Peripheral vascular disease, unspecified ([name]) critical stenosis lower extremity 12/01/2023   Peripheral arterial disease     Pneumonia    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   Pruritus, unspecified 12/06/2019  Pulmonary hypertension, unspecified ([name]) 11/03/2021   Pulmonary nodule 06/09/2023   Appears very slow growing.  Chodri CT annually.     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   Symptomatic anemia 02/28/2024    Type 2 diabetes mellitus with chronic kidney disease on chronic dialysis, with long-term current use of insulin  ([name]) 12/23/2015   Type 2 diabetes mellitus, with long-term current use of insulin  ([name]) 03/11/2019   Unspecified hemorrhoids 01/08/2020   Family History  Problem Relation Age of Onset   Diabetes Mother    Hypertension Mother    Diabetes Maternal Grandfather    Liver disease Other    Past Surgical History:  Procedure Laterality Date   A/V FISTULAGRAM Left 02/17/2021   Procedure: A/V FISTULAGRAM;  Surgeon: Sheree Penne Bruckner, MD;  Location: [facility] INVASIVE CV LAB;  Service: Cardiovascular;  Laterality: Left;   APPENDECTOMY     AV FISTULA PLACEMENT Left    AV FISTULA PLACEMENT Left 03/12/2021   Procedure: INSERTION OF LEFT UPPER ARM ARTERIOVENOUS (AV) GORE-TEX GRAFT;  Surgeon: Sheree Penne Bruckner, MD;  Location: [facility] OR;  Service: Vascular;  Laterality: Left;   BALLOON ENTEROSCOPY  03/02/2024   Procedure: ENTEROSCOPY, USING BALLOON;  Surgeon: San Sandor GAILS, DO;  Location: [facility] ENDOSCOPY;  Service: Gastroenterology;;   COLONOSCOPY WITH PROPOFOL  N/A 03/18/2021   Procedure: COLONOSCOPY WITH PROPOFOL ;  Surgeon: Legrand Victory LITTIE DOUGLAS, MD;  Location: [facility] ENDOSCOPY;  Service: Gastroenterology;  Laterality: N/A;   CYSTECTOMY     ENTEROSCOPY N/A 03/21/2021   Procedure: ENTEROSCOPY;  Surgeon: Legrand Victory LITTIE DOUGLAS, MD;  Location: [facility] ENDOSCOPY;  Service: Gastroenterology;  Laterality: N/A;   ENTEROSCOPY N/A 12/20/2021   Procedure: ENTEROSCOPY;  Surgeon: Legrand Victory LITTIE DOUGLAS, MD;  Location: [facility] ENDOSCOPY;  Service: Gastroenterology;  Laterality: N/A;   ENTEROSCOPY N/A 02/26/2024   Procedure: ENTEROSCOPY;  Surgeon: Albertus Gordy HERO, MD;  Location: [facility] ENDOSCOPY;  Service: Gastroenterology;  Laterality: N/A;   ENTEROSCOPY N/A 02/28/2024   Procedure: ENTEROSCOPY;  Surgeon: San Sandor GAILS, DO;  Location: [facility] ENDOSCOPY;  Service: Gastroenterology;  Laterality: N/A;   ESOPHAGOGASTRODUODENOSCOPY  (EGD) WITH PROPOFOL  N/A 03/18/2021   Procedure: ESOPHAGOGASTRODUODENOSCOPY (EGD) WITH PROPOFOL ;  Surgeon: Legrand Victory LITTIE DOUGLAS, MD;  Location: [facility] ENDOSCOPY;  Service: Gastroenterology;  Laterality: N/A;   GIVENS CAPSULE STUDY N/A 03/19/2021   Procedure: GIVENS CAPSULE STUDY;  Surgeon: Legrand Victory LITTIE DOUGLAS, MD;  Location: [facility] ENDOSCOPY;  Service: Gastroenterology;  Laterality: N/A;   GIVENS CAPSULE STUDY N/A 02/29/2024   Procedure: IMAGING PROCEDURE, GI TRACT, INTRALUMINAL, VIA CAPSULE;  Surgeon: San Sandor GAILS, DO;  Location: [facility] ENDOSCOPY;  Service: Gastroenterology;  Laterality: N/A;   HEMOSTASIS CLIP PLACEMENT  12/20/2021   Procedure: HEMOSTASIS CLIP PLACEMENT;  Surgeon: Legrand Victory LITTIE DOUGLAS, MD;  Location: [facility] ENDOSCOPY;  Service: Gastroenterology;;   HOT HEMOSTASIS N/A 03/21/2021   Procedure: HOT HEMOSTASIS (ARGON PLASMA COAGULATION/BICAP);  Surgeon: Legrand Victory LITTIE DOUGLAS, MD;  Location: [facility] ENDOSCOPY;  Service: Gastroenterology;  Laterality: N/A;   HOT HEMOSTASIS N/A 12/20/2021   Procedure: HOT HEMOSTASIS (ARGON PLASMA COAGULATION/BICAP);  Surgeon: Legrand Victory LITTIE DOUGLAS, MD;  Location: [facility] ENDOSCOPY;  Service: Gastroenterology;  Laterality: N/A;   HOT HEMOSTASIS N/A 03/02/2024   Procedure: EGD, WITH ARGON PLASMA COAGULATION;  Surgeon: San Sandor GAILS, DO;  Location: [facility] ENDOSCOPY;  Service: Gastroenterology;  Laterality: N/A;   HYSTEROSCOPY     IR THROMBECTOMY AV FISTULA W/THROMBOLYSIS/PTA INC/SHUNT/IMG LEFT Left 04/12/2024   IR  US  GUIDE VASC ACCESS LEFT  04/12/2024   LOWER EXTREMITY ANGIOGRAPHY N/A 02/03/2024   Procedure: Lower Extremity Angiography;  Surgeon: Court Dorn PARAS, MD;  Location: [facility] INVASIVE CV LAB;  Service: Cardiovascular;  Laterality: N/A;   POLYPECTOMY  03/18/2021   Procedure: POLYPECTOMY;  Surgeon: Legrand Victory LITTIE DOUGLAS, MD;  Location: [facility] ENDOSCOPY;  Service: Gastroenterology;;    Short Social History:  Social History   Tobacco Use   Smoking status: Former    Current packs/day: 0.00     Types: Cigarettes    Quit date: 1983    Years since quitting: 42.5   Smokeless tobacco: Never   Tobacco comments:    quit in 1973  Substance Use Topics   Alcohol  use: Not Currently    Allergies  Allergen Reactions   Atorvastatin  Other (See Comments)    Constipation   Wound Dressing Adhesive Rash    No current facility-administered medications for this encounter.    REVIEW OF SYSTEMS All other systems were reviewed and are negative     Objective:   Objective   Vitals:   05/17/24 0819 05/17/24 0829  BP: (!) 140/74 (!) 140/74  Pulse: 85 94  Resp: 12 12  Temp: 97.6 F (36.4 C)   TempSrc: Oral   SpO2: 93% 97%   There is no height or weight on file to calculate BMI.  Physical Exam General: no acute distress Cardiac: hemodynamically stable Extremities: Left arm AVG without palpable thrill or pulse.      Assessment/Plan:   SHELISA FERN is a 83 y.o. female with ESRD presenting for AVG thrombectomy.  Having issues with clotted AVG. Last HD session Saturday. Reviewed risks and benefits of AVG thrombectomy, possible TDC placement and patient agreed to proceed.   Norman Serve, MD [facility]

## 2024-05-17 NOTE — Op Note (Signed)
 Patient name: DANITRA PAYANO MRN: [id number] DOB: 1941/07/23 Sex: female  05/17/2024 Pre-operative Diagnosis: ESRD on HD Post-operative diagnosis:  Same Surgeon:  Norman GORMAN Serve, MD Procedure Performed:  Ultrasound-guided access of the left arm AVG in an antegrade fashion Ultrasound-guided access of the left arm AVG in a retrograde fashion Mechanical thrombectomy of the left arm AVG, aspiration via guide cath and over-the-wire thrombectomy with 5 mm Mustang balloon Balloon angioplasty of the entirety of the graft and cephalic arch, 7 mm 100 mm Mustang Balloon angioplasty of the left subclavian vein, 12 mm x 40 mm Mustang  Indications: Ms. Seyer is a 83 year old female with ESRD who had a brachiocephalic fistula revision with interposition 4-7 AVG in 2022.  She presented to the HD access center today due to a clotted graft.  She last underwent dialysis on Saturday.  Risks and benefits of thrombectomy and possible catheter placement were reviewed and she elected to proceed.  Findings:  Widely patent central venous system although stagnant flow likely due to her severe tricuspid regurg. 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  After mechanical thrombectomy and balloon angioplasty there was widely patent brisk flow through the AVG with pulsatile inflow, the flow in the chest was somewhat stagnant until flow likely due to her valvular issues.   Procedure:  The patient was identified in the holding area and taken to the cath lab  The patient was then placed supine on the table and prepped and draped in the usual sterile fashion.  A time out was called.  Ultrasound was used to evaluate the left arm AV access. This was accessed under u/s guidance and an antegrade fashion. An 018 wire was advanced without resistance, a micropuncture sheath was placed and a Bentson wire was placed through the thrombosed graft and into the central system.  The access  was then upsized over this wire to a 7 [place] sheath and a 7 [place] guide cath was placed over this wire and into the central system.  A central venogram was then obtained which demonstrated wide patency although some stagnant flow in the subclavian and innominate veins.  I then performed 2 passes of a 7 French glide cath under aspiration to aspirate any acute thrombus.  The wire was then replaced into the central system and the entire graft was ballooned with a 7 mm Mustang balloon.  A fistulogram was then obtained which demonstrated a significant stenosis in the mid graft and this was reballooned with the 7 mm Mustang at a higher pressure.  Ultrasound was then used to evaluate the graft in the proximal upper arm.  The skin was anesthetized and this was accessed under ultrasound guidance in a retrograde fashion.  The 018 wire was advanced without resistance and a micropuncture sheath was placed.  A Glidewire was then placed through the micropuncture sheath and this access was upsized to a 6 [place] sheath.  Using the Glidewire was able to cross the arterial anastomosis and the wire was passed down into the radial artery.  Over this wire a 5 mm Mustang balloon was tracked into the brachial artery, inflated to about 4 atm and gently pulled  back to pull the arterial plug.  This was performed twice which restored pulsatile inflow into the graft.  An angiogram demonstrated excellent inflow and preserved flow into the radial and ulnar arteries.  A fistulogram demonstrated brisk flow through the graft although stagnant to and fro  flow in the central system.  It was unclear whether there was a stenosis in the subclavian vein at the first rib and therefore I elected to treat this area with a 12 mm x 40 mm Mustang balloon.  I then obtained multiple central venograms through the catheter which demonstrated wide patency of the central veins although there was some stagnant flow which was likely due to her severe valvular  issues.  Both wires and sheaths were removed and 4-0 Monocryl figure-of-eight sutures were placed for hemostasis.  Contrast: 60 cc   Impression: Successful recanalization of the left arm AV graft.  A severe mid graft stenosis was treated to less than 30% residual stenosis.  There is stagnant, to and fro flow in the central system likely due to her severe tricuspid regurgitation.   Norman GORMAN Serve MD [facility] [place] Office: [phone]

## 2024-05-18 ENCOUNTER — Emergency Department (HOSPITAL_COMMUNITY)

## 2024-05-18 ENCOUNTER — Telehealth: Payer: Self-pay

## 2024-05-18 ENCOUNTER — Emergency Department (HOSPITAL_COMMUNITY)
Admission: EM | Admit: 2024-05-18 | Discharge: 2024-05-19 | Disposition: A | Discharge status: Home / Self Care | Source: Home / Self Care | Attending: Emergency Medicine | Admitting: Emergency Medicine

## 2024-05-18 ENCOUNTER — Other Ambulatory Visit: Payer: Self-pay | Admitting: Hematology and Oncology

## 2024-05-18 ENCOUNTER — Ambulatory Visit (HOSPITAL_COMMUNITY)
Admit: 2024-05-18 | Discharge: 2024-05-18 | Disposition: A | Discharge status: Home / Self Care | Source: Ambulatory Visit | Attending: Vascular Surgery | Admitting: Vascular Surgery

## 2024-05-18 ENCOUNTER — Encounter (HOSPITAL_COMMUNITY)
Disposition: A | Discharge status: Home / Self Care | Payer: Self-pay | Source: Ambulatory Visit | Attending: Vascular Surgery

## 2024-05-18 ENCOUNTER — Other Ambulatory Visit: Payer: Self-pay

## 2024-05-18 DIAGNOSIS — I071 Rheumatic tricuspid insufficiency: Secondary | ICD-10-CM | POA: Diagnosis not present

## 2024-05-18 DIAGNOSIS — N186 End stage renal disease: Secondary | ICD-10-CM

## 2024-05-18 DIAGNOSIS — E1151 Type 2 diabetes mellitus with diabetic peripheral angiopathy without gangrene: Secondary | ICD-10-CM | POA: Diagnosis not present

## 2024-05-18 DIAGNOSIS — E1122 Type 2 diabetes mellitus with diabetic chronic kidney disease: Secondary | ICD-10-CM | POA: Insufficient documentation

## 2024-05-18 DIAGNOSIS — Z79899 Other long term (current) drug therapy: Secondary | ICD-10-CM | POA: Insufficient documentation

## 2024-05-18 DIAGNOSIS — Z794 Long term (current) use of insulin: Secondary | ICD-10-CM | POA: Insufficient documentation

## 2024-05-18 DIAGNOSIS — I132 Hypertensive heart and chronic kidney disease with heart failure and with stage 5 chronic kidney disease, or end stage renal disease: Secondary | ICD-10-CM | POA: Insufficient documentation

## 2024-05-18 DIAGNOSIS — T82868A Thrombosis of vascular prosthetic devices, implants and grafts, initial encounter: Secondary | ICD-10-CM | POA: Diagnosis not present

## 2024-05-18 DIAGNOSIS — Z992 Dependence on renal dialysis: Secondary | ICD-10-CM | POA: Diagnosis not present

## 2024-05-18 DIAGNOSIS — I361 Nonrheumatic tricuspid (valve) insufficiency: Secondary | ICD-10-CM

## 2024-05-18 DIAGNOSIS — Z87891 Personal history of nicotine dependence: Secondary | ICD-10-CM | POA: Diagnosis not present

## 2024-05-18 DIAGNOSIS — I5032 Chronic diastolic (congestive) heart failure: Secondary | ICD-10-CM | POA: Insufficient documentation

## 2024-05-18 DIAGNOSIS — J449 Chronic obstructive pulmonary disease, unspecified: Secondary | ICD-10-CM | POA: Insufficient documentation

## 2024-05-18 DIAGNOSIS — T82838A Hemorrhage of vascular prosthetic devices, implants and grafts, initial encounter: Secondary | ICD-10-CM | POA: Insufficient documentation

## 2024-05-18 DIAGNOSIS — I509 Heart failure, unspecified: Secondary | ICD-10-CM | POA: Insufficient documentation

## 2024-05-18 DIAGNOSIS — R58 Hemorrhage, not elsewhere classified: Secondary | ICD-10-CM

## 2024-05-18 HISTORY — PX: DIALYSIS/PERMA CATHETER INSERTION: CATH118288

## 2024-05-18 LAB — GLUCOSE, CAPILLARY: Glucose-Capillary: 105 mg/dL — ABNORMAL HIGH (ref 70–99)

## 2024-05-18 SURGERY — DIALYSIS/PERMA CATHETER INSERTION
Anesthesia: LOCAL

## 2024-05-18 MED ORDER — LIDOCAINE HCL (PF) 1 % IJ SOLN
INTRAMUSCULAR | Status: DC | PRN
  Administered 2024-05-18: 10 mL

## 2024-05-18 MED ORDER — HEPARIN (PORCINE) IN NACL 1000-0.9 UT/500ML-% IV SOLN
INTRAVENOUS | Status: DC | PRN
  Administered 2024-05-18: 500 mL

## 2024-05-18 MED ORDER — LIDOCAINE HCL (PF) 1 % IJ SOLN
5.0000 mL | Freq: Once | INTRAMUSCULAR | Status: DC
  Filled 2024-05-18: qty 5

## 2024-05-18 MED ORDER — HEPARIN SODIUM (PORCINE) 1000 UNIT/ML IJ SOLN
INTRAMUSCULAR | Status: AC
  Filled 2024-05-18: qty 10

## 2024-05-18 MED ORDER — LIDOCAINE HCL (PF) 1 % IJ SOLN
INTRAMUSCULAR | Status: AC
Start: 2024-05-18 — End: 2024-05-18
  Filled 2024-05-18: qty 30

## 2024-05-18 MED ORDER — HEPARIN SODIUM (PORCINE) 1000 UNIT/ML IJ SOLN
INTRAMUSCULAR | Status: DC | PRN
  Administered 2024-05-18: 3200 [IU] via INTRAVENOUS

## 2024-05-18 MED ORDER — LIDOCAINE HCL (PF) 1 % IJ SOLN
5.0000 mL | Freq: Once | INTRAMUSCULAR | Status: AC
  Administered 2024-05-18: 5 mL
  Filled 2024-05-18: qty 5

## 2024-05-18 MED ORDER — LIDOCAINE HCL (PF) 1 % IJ SOLN
INTRAMUSCULAR | Status: DC | PRN
  Administered 2024-05-18: 10 mL via INTRADERMAL

## 2024-05-18 SURGICAL SUPPLY — 5 items
CATH PALINDROME-P 19CM W/VT (CATHETERS) IMPLANT
KIT MICROPUNCTURE NIT STIFF (SHEATH) IMPLANT
KIT PV (KITS) ×1 IMPLANT
MAT PREVALON FULL STRYKER (MISCELLANEOUS) IMPLANT
TRAY PV CATH (CUSTOM PROCEDURE TRAY) ×1 IMPLANT

## 2024-05-18 NOTE — ED Notes (Signed)
 Phlebotomy attempted to collect blood unable to collect RN aware

## 2024-05-18 NOTE — Consult Note (Signed)
 Hospital Consult    Reason for Consult:  Bleeding around [facility] Referring Physician:  ED MRN #:  [id number]  History of Present Illness: This is a 83 y.o. female with ESRD that vascular surgery has been consulted for bleeding around [facility].  Patient underwent TDC placement earlier today by Dr. Pearline in the right IJ with tunnel catheter placement.  Oozing around catheter since getting home.  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 difficile colitis 03/30/2021   Cardiac arrest ([name]) 03/02/2024   Cellulitis 03/16/2021   CHF with left ventricular diastolic dysfunction, NYHA class 1 ([name]) 02/02/2017   Cholelithiasis 03/30/2021   Chronic constipation 12/22/2015   Chronic laryngitis 02/17/2017   Last Assessment & Plan:   Formatting of this note might be different from the original.  Evaluation of larynx.  Was hospitalized for pneumonia and during that time she started noticing worsening hoarseness.  Denies any throat pain.  No obvious heartburn.  She did recently start PPI therapy.  She smoked in the past.  Denies any difficulty swallowing.  Hoarseness is improving.  EXAM shows mildly ras   Coagulation defect, unspecified ([name]) 12/07/2019   Colon polyp    Complication of vascular dialysis catheter 03/05/2020   Contact with and (suspected)  exposure to covid-19 10/28/2020   COPD mixed type ([name])    Cough, unspecified 10/28/2020   COVID-19 10/29/2020   Depression    Diverticulosis of large intestine without perforation or abscess without bleeding 01/08/2020   Dyspnea on exertion    Dyspnea, unspecified 11/02/2019   Elevated troponin 03/16/2021   Encounter for removal of sutures 12/28/2019   Encounter for screening for respiratory tuberculosis 12/06/2019   ESRD (end stage renal disease) ([name]) 07/18/2019   ESRD on dialysis ([name]) 09/18/2019   ESRD on hemodialysis ([name]) 01/02/2020   Fever, unspecified 01/23/2020   GI bleed 12/19/2021   GIB (gastrointestinal bleeding) 01/02/2020   Heme positive stool    High risk medication use 12/22/2015   History of blood transfusion    History of colonic polyps 01/08/2020   HLD (hyperlipidemia) 12/22/2015   Hypertension    Hypervolemia 11/13/2019   Hypokalemia 03/11/2019   Hyponatremia 03/11/2019   Hypovolemia 10/27/2021   Hypoxia 03/11/2019   Insomnia    Insulin  long-term use ([name]) 12/22/2015   Iron  deficiency anemia, unspecified 12/06/2019   Major depressive disorder, recurrent ([name]) 12/22/2015   Malaise and fatigue 12/23/2015   Mild protein-calorie malnutrition ([name]) 03/26/2020   Mitral regurgitation moderate to severe based on echocardiogram from 2022 11/03/2021   Musculoskeletal chest pain 03/03/2024   Osteoarthritis    Osteopenia    Other disorders of phosphorus metabolism 01/08/2020   PAC (premature atrial contraction) 10/04/2020   Pain, unspecified 12/06/2019   PAT (paroxysmal atrial tachycardia) ([name]) 10/04/2020   Peripheral vascular disease, unspecified ([name]) critical stenosis lower extremity 12/01/2023  Peripheral arterial disease     Pneumonia    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  Chodri CT annually.     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   Symptomatic anemia 02/28/2024   Type 2 diabetes mellitus with chronic kidney disease on chronic dialysis, with long-term current use of insulin  ([name]) 12/23/2015   Type 2 diabetes mellitus, with long-term current use of insulin  ([name]) 03/11/2019   Unspecified hemorrhoids 01/08/2020    Past Surgical History:  Procedure Laterality Date   A/V FISTULAGRAM Left 02/17/2021   Procedure: A/V FISTULAGRAM;  Surgeon: Sheree Penne Bruckner, MD;  Location: [facility] INVASIVE CV LAB;  Service: Cardiovascular;  Laterality: Left;   APPENDECTOMY     AV FISTULA PLACEMENT Left    AV FISTULA PLACEMENT Left 03/12/2021   Procedure: INSERTION OF LEFT UPPER ARM ARTERIOVENOUS (AV) GORE-TEX GRAFT;  Surgeon: Sheree Penne Bruckner, MD;  Location: [facility] OR;  Service: Vascular;  Laterality: Left;   BALLOON ENTEROSCOPY  03/02/2024   Procedure: ENTEROSCOPY, USING BALLOON;  Surgeon: San Sandor GAILS, DO;  Location: [facility] ENDOSCOPY;  Service: Gastroenterology;;   COLONOSCOPY WITH PROPOFOL  N/A 03/18/2021   Procedure: COLONOSCOPY WITH PROPOFOL ;  Surgeon: Legrand Victory LITTIE DOUGLAS, MD;  Location: [facility] ENDOSCOPY;  Service: Gastroenterology;  Laterality: N/A;   CYSTECTOMY     ENTEROSCOPY N/A 03/21/2021   Procedure: ENTEROSCOPY;  Surgeon: Legrand Victory LITTIE DOUGLAS, MD;  Location: [facility] ENDOSCOPY;  Service: Gastroenterology;  Laterality: N/A;   ENTEROSCOPY N/A 12/20/2021   Procedure: ENTEROSCOPY;  Surgeon: Legrand Victory LITTIE DOUGLAS, MD;  Location: [facility] ENDOSCOPY;  Service: Gastroenterology;  Laterality: N/A;   ENTEROSCOPY N/A 02/26/2024   Procedure: ENTEROSCOPY;  Surgeon: Albertus Gordy HERO, MD;  Location: [facility] ENDOSCOPY;  Service: Gastroenterology;  Laterality: N/A;   ENTEROSCOPY N/A 02/28/2024   Procedure: ENTEROSCOPY;  Surgeon: San Sandor GAILS, DO;  Location: [facility] ENDOSCOPY;  Service: Gastroenterology;  Laterality: N/A;   ESOPHAGOGASTRODUODENOSCOPY  (EGD) WITH PROPOFOL  N/A 03/18/2021   Procedure: ESOPHAGOGASTRODUODENOSCOPY (EGD) WITH PROPOFOL ;  Surgeon: Legrand Victory LITTIE DOUGLAS, MD;  Location: [facility] ENDOSCOPY;  Service: Gastroenterology;  Laterality: N/A;   GIVENS CAPSULE STUDY N/A 03/19/2021   Procedure: GIVENS CAPSULE STUDY;  Surgeon: Legrand Victory LITTIE DOUGLAS, MD;  Location: [facility] ENDOSCOPY;  Service: Gastroenterology;  Laterality: N/A;   GIVENS CAPSULE STUDY N/A 02/29/2024   Procedure: IMAGING PROCEDURE, GI TRACT, INTRALUMINAL, VIA CAPSULE;  Surgeon: San Sandor GAILS, DO;  Location: [facility] ENDOSCOPY;  Service: Gastroenterology;  Laterality: N/A;   HEMOSTASIS CLIP PLACEMENT  12/20/2021   Procedure: HEMOSTASIS CLIP PLACEMENT;  Surgeon: Legrand Victory LITTIE DOUGLAS, MD;  Location: [facility] ENDOSCOPY;  Service: Gastroenterology;;   HOT HEMOSTASIS N/A 03/21/2021   Procedure: HOT HEMOSTASIS (ARGON PLASMA COAGULATION/BICAP);  Surgeon: Legrand Victory LITTIE DOUGLAS, MD;  Location: [facility] ENDOSCOPY;  Service: Gastroenterology;  Laterality: N/A;   HOT HEMOSTASIS N/A 12/20/2021   Procedure: HOT HEMOSTASIS (ARGON PLASMA COAGULATION/BICAP);  Surgeon: Legrand Victory LITTIE DOUGLAS, MD;  Location: [facility] ENDOSCOPY;  Service: Gastroenterology;  Laterality: N/A;   HOT HEMOSTASIS N/A 03/02/2024   Procedure: EGD, WITH ARGON PLASMA COAGULATION;  Surgeon: San Sandor GAILS, DO;  Location: [facility] ENDOSCOPY;  Service: Gastroenterology;  Laterality: N/A;   HYSTEROSCOPY     IR THROMBECTOMY AV FISTULA W/THROMBOLYSIS/PTA INC/SHUNT/IMG LEFT Left 04/12/2024   IR US  GUIDE VASC ACCESS LEFT  04/12/2024   LOWER  EXTREMITY ANGIOGRAPHY N/A 02/03/2024   Procedure: Lower Extremity Angiography;  Surgeon: Court Dorn PARAS, MD;  Location: [facility] INVASIVE CV LAB;  Service: Cardiovascular;  Laterality: N/A;   PERIPHERAL VASCULAR THROMBECTOMY Left 05/17/2024   Procedure: PERIPHERAL VASCULAR THROMBECTOMY;  Surgeon: Pearline Norman RAMAN, MD;  Location: [facility] LAB;  Service: Cardiovascular;  Laterality: Left;   POLYPECTOMY  03/18/2021   Procedure: POLYPECTOMY;   Surgeon: Legrand Victory LITTIE DOUGLAS, MD;  Location: [facility] ENDOSCOPY;  Service: Gastroenterology;;    Allergies  Allergen Reactions   Atorvastatin  Other (See Comments)    Constipation   Wound Dressing Adhesive Rash    Prior to Admission medications   Medication Sig Start Date End Date Taking? Authorizing Provider  acetaminophen  (TYLENOL ) 500 MG tablet Take 2 tablets (1,000 mg total) by mouth every 8 (eight) hours as needed for mild pain (pain score 1-3) or moderate pain (pain score 4-6). 05/09/24   Zheng, Michael, DO  ascorbic acid  (VITAMIN C ) 1000 MG tablet Take 1,000 mg by mouth at bedtime. 12/07/19   [provider]  Biotin  5000 MCG TABS Take 5,000 mcg by mouth daily in the afternoon.    [provider]  camphor-menthol  VIKKI) lotion Apply 1 Application topically as needed for itching. 04/29/24   D'Mello, Rosalyn, DO  carboxymethylcellulose (REFRESH PLUS) 0.5 % SOLN Place 1 drop into both eyes 3 (three) times daily as needed (dry eyes).    [provider]  cyanocobalamin  (VITAMIN B12) 1000 MCG tablet Take 1,000 mcg by mouth every evening.    [provider]  ferric citrate  (AURYXIA ) 1 GM 210 MG(Fe) tablet Take 210 mg by mouth 3 (three) times daily with meals.    [provider]  furosemide  (LASIX ) 80 MG tablet Take 80 mg by mouth daily in the afternoon. 04/03/21   [provider]  Insulin  Glargine-Lixisenatide  (SOLIQUA ) 100-33 UNT-MCG/ML SOPN Inject 6 Units into the skin in the morning. 03/11/24   Zheng, Michael, DO  Lactobacillus (PROBIOTIC ACIDOPHILUS PO) Take 1 capsule by mouth in the morning.    [provider]  lidocaine -prilocaine  (EMLA ) cream Apply 1 application topically as needed (prior to fisula access). Tuesday, Thursday, Saturday for dialysis 07/04/20   [provider]  Methoxy PEG-Epoetin Beta (MIRCERA IJ) 225 mcg. 05/13/24 05/12/25  [provider]  midodrine  (PROAMATINE ) 10 MG tablet Take 10 mg by mouth 3 (three)  times daily. 05/04/24   [provider]  Omega-3 Fatty Acids (FISH OIL PO) Take 1 capsule by mouth in the morning and at bedtime.    [provider]  ondansetron  (ZOFRAN ) 8 MG tablet Take 1 tablet (8 mg total) by mouth every 8 (eight) hours as needed for nausea. 05/15/24   Harl Setter A, NP  ondansetron  (ZOFRAN -ODT) 4 MG disintegrating tablet Take 4 mg by mouth every 8 (eight) hours as needed. 05/03/24   [provider]  oxyCODONE  (ROXICODONE ) 5 MG/5ML solution Take 5 mg by mouth.    [provider]  pantoprazole  (PROTONIX ) 40 MG tablet Take 1 tablet (40 mg total) by mouth 2 (two) times daily. 03/11/24   Zheng, Michael, DO  polyethylene glycol powder (GLYCOLAX /MIRALAX ) 17 GM/SCOOP powder Take 17 g by mouth daily. 05/09/24   Zheng, Michael, DO  rosuvastatin  (CRESTOR ) 10 MG tablet Take 1 tablet (10 mg total) by mouth daily. 04/29/24   D'Mello, Rosalyn, DO  senna (SENOKOT) 8.6 MG TABS tablet Take 1 tablet (8.6 mg total) by mouth daily. 05/09/24   Zheng, Michael, DO  Vitamin E   670 MG (1000 UT) CAPS Take 1,000 Units by mouth in the morning. 01/09/20   [provider]  Zinc  50 MG TABS Take 50 mg by mouth daily.    [provider]    Social History   Socioeconomic History   Marital status: Widowed    Spouse name: Not on file   Number of children: 3   Years of education: Not on file   Highest education level: Not on file  Occupational History   Not on file  Tobacco Use   Smoking status: Former    Current packs/day: 0.00    Types: Cigarettes    Quit date: 65    Years since quitting: 42.5   Smokeless tobacco: Never   Tobacco comments:    quit in 1973  Vaping Use   Vaping status: Never Used  Substance and Sexual Activity   Alcohol  use: Not Currently   Drug use: Never   Sexual activity: Not Currently  Other Topics Concern   Not on file  Social History Narrative   Not on file   Social Drivers of Health   Financial Resource Strain: Low  Risk  (10/27/2021)   Received from [facility]   Overall Financial Resource Strain (CARDIA)    Difficulty of Paying Living Expenses: Not hard at all  Food Insecurity: No Food Insecurity (05/06/2024)   Hunger Vital Sign    Worried About Running Out of Food in the Last Year: Never true    Ran Out of Food in the Last Year: Never true  Transportation Needs: No Transportation Needs (05/06/2024)   PRAPARE - Administrator, Civil Service (Medical): No    Lack of Transportation (Non-Medical): No  Physical Activity: Not on file  Stress: No Stress Concern Present (10/27/2021)   Received from [facility] - Occupational Stress Questionnaire    Feeling of Stress : Not at all  Social Connections: Socially Isolated (05/06/2024)   Social Connection and Isolation Panel    Frequency of Communication with Friends and Family: Twice a week    Frequency of Social Gatherings with Friends and Family: Twice a week    Attends Religious Services: Never    Database administrator or Organizations: No    Attends Banker Meetings: Never    Marital Status: Widowed  Intimate Partner Violence: Not At Risk (05/06/2024)   Humiliation, Afraid, Rape, and Kick questionnaire    Fear of Current or Ex-Partner: No    Emotionally Abused: No    Physically Abused: No    Sexually Abused: No     Family History  Problem Relation Age of Onset   Diabetes Mother    Hypertension Mother    Diabetes Maternal Grandfather    Liver disease Other     ROS: [x]  Positive   [ ]  Negative   [ ]  All sytems reviewed and are negative  Cardiovascular: []  chest pain/pressure []  palpitations []  SOB lying flat []  DOE []  pain in legs while walking []  pain in legs at rest []  pain in legs at night []  non-healing ulcers []  hx of DVT []  swelling in legs  Pulmonary: []  productive cough []  asthma/wheezing []  home O2  Neurologic: []  weakness in []  arms []  legs []   numbness in []  arms []  legs []  hx of CVA []  mini stroke [] difficulty speaking or slurred speech []  temporary loss of vision in one eye []  dizziness  Hematologic: []  hx of cancer []   bleeding problems []  problems with blood clotting easily  Endocrine:   []  diabetes []  thyroid disease  GI []  vomiting blood []  blood in stool  GU: []  CKD/renal failure []  HD--[]  M/W/F or []  T/T/S []  burning with urination []  blood in urine  Psychiatric: []  anxiety []  depression  Musculoskeletal: []  arthritis []  joint pain  Integumentary: []  rashes []  ulcers  Constitutional: []  fever []  chills   Physical Examination  Vitals:   05/18/24 2100 05/18/24 2115  BP: (!) 151/60 137/60  Pulse: 91 91  Resp: (!) 22 19  Temp:    SpO2: 98% 97%   Body mass index is 30.04 kg/m.  General:  WDWN in NAD Gait: Not observed HENT: WNL, normocephalic Pulmonary: normal non-labored breathing Cardiac: regular, without  Murmurs, rubs or gallops Abdomen:  soft, NT/ND Vascular Exam/Pulses: Right IJ TDC with oozing, no chest wall or neck hematoma    CBC    Component Value Date/Time   WBC 7.5 05/15/2024 0823   WBC 9.4 05/09/2024 0536   RBC 3.24 (L) 05/15/2024 0823   HGB 10.3 (L) 05/15/2024 0823   HGB 11.3 01/12/2024 1259   HCT 32.7 (L) 05/15/2024 0823   HCT 35.0 01/12/2024 1259   PLT 138 (L) 05/15/2024 0823   PLT 208 01/12/2024 1259   MCV 100.9 (H) 05/15/2024 0823   MCV 99 (H) 01/12/2024 1259   MCH 31.8 05/15/2024 0823   MCHC 31.5 05/15/2024 0823   RDW 20.2 (H) 05/15/2024 0823   RDW 15.7 (H) 01/12/2024 1259   LYMPHSABS 1.1 05/15/2024 0823   LYMPHSABS 0.9 01/12/2024 1259   MONOABS 0.8 05/15/2024 0823   EOSABS 0.1 05/15/2024 0823   EOSABS 0.1 01/12/2024 1259   BASOSABS 0.1 05/15/2024 0823   BASOSABS 0.0 01/12/2024 1259    BMET    Component Value Date/Time   NA 131 (L) 05/15/2024 0823   NA 134 01/12/2024 1259   K 4.5 05/15/2024 0823   CL 89 (L) 05/15/2024 0823   CO2 28  05/15/2024 0823   GLUCOSE 119 (H) 05/15/2024 0823   BUN 30 (H) 05/15/2024 0823   BUN 31 (H) 01/12/2024 1259   CREATININE 4.95 (H) 05/15/2024 0823   CALCIUM  9.8 05/15/2024 0823   GFRNONAA 8 (L) 05/15/2024 0823    COAGS: Lab Results  Component Value Date   INR 1.5 (H) 05/05/2024   INR 1.3 (H) 04/10/2024   INR 1.4 (H) 03/02/2024     Non-Invasive Vascular Imaging:    Chest x-ray reviewed and catheter in good position  ASSESSMENT/PLAN: This is a 83 y.o. female with ESRD that vascular surgery was consulted for bleeding around [facility] exit site on chest wall.  Patient underwent TDC placement today by Dr. Pearline in the right IJ with tunnel catheter.  Ultimately the catheter exit site was sutured with 3-0 nylon and Dermabond with good hemostasis after this was cleaned and this was performed sterilely.  Ok for discharge from my standpoint and can use catheter.     Lonni DOROTHA Gaskins, MD [facility] [place] Office: [phone]  Lonni JINNY Gaskins

## 2024-05-18 NOTE — Op Note (Signed)
    Patient name: Anna Carr MRN: [id number] DOB: [birth year] Sex: female  05/18/2024 Pre-operative Diagnosis: ESRD on HD Post-operative diagnosis:  Same Surgeon:  Norman GORMAN Serve, MD Procedure Performed:  Ultrasound and fluoroscopic guided right internal jugular tunneled dialysis catheter placement  Indications: Ms. Holquin is an 83 year old female with ESRD on HD who presents to the HD access center today for tunneled dialysis catheter placement.  She underwent thrombectomy of her left arm AV graft yesterday.  On completion pictures the graft was widely patent although there was sluggish flow in the central system.  This was thought to be due to moderate to severe tricuspid regurgitation as multiple views were obtained which did not demonstrate any central stenosis or clot.  We got called today that her graft was rethrombosed and she presents to the access center today for placement of a tunneled access catheter.  There are some benefits were reviewed and she elected to proceed.  Findings:  Widely patent and compressible right internal jugular vein Successful placement of tunneled dialysis catheter at atriocaval junction   Procedure:  The patient was identified in the holding area and taken to the cath lab  The patient was then placed supine on the table and prepped and draped in the usual sterile fashion.  A time out was called.  Using ultrasound guidance the right internal jugular vein was accessed with micropuncture technique.  Through the micropuncture sheath, the guidewire was advanced into the superior vena cava.  A small incision was made around the skin access point.  The access point was serially dilated under direct fluoroscopic guidance.  A peel-away sheath was introduced into the superior vena cava under fluoroscopic guidance.  A counterincision was made in the chest under the clavicle.  A 19 cm tunnel dialysis catheter was then tunneled under the skin, over the clavicle into the  incision in the neck.  The tunneling device was removed and the catheter fed through the peel-away sheath into the superior vena cava.  The peel-away sheath was removed and the catheter gently pulled back.  Adequate position was confirmed with x-ray.  The catheter was tested and found to aspirate and flush with ease.    The catheter was sutured to the skin and the neck incision was closed with 4-0 Monocryl.  The catheter was then capped and heparin  locked.  Impression: Successful placement of 19 cm dialysis catheter at atriocaval junction    Norman GORMAN Serve MD [facility] [place] Office: [phone]

## 2024-05-18 NOTE — Telephone Encounter (Signed)
 Daughther wanting labs results and upcomming appts . If infusion is needed and follow-up thanks.

## 2024-05-18 NOTE — ED Provider Notes (Signed)
  Provider Note MRN:  [id number]  Arrival date & time: 05/19/24    ED Course and Medical Decision Making  Assumed care of patient at sign-out or upon transfer.  Recently placed tunneled HD line presenting with some oozing at the site.  Hemostasis achieved by vascular surgery at bedside.  Awaiting CBC given the amount of estimated blood loss.  Doing well hemodynamically.  12:20 AM update: On my reassessment patient is sitting up in the bed looks well, no complaints, vitals reassuring.  Continues to exhibit hemostasis with regard to the tunneled catheter.  H&H with a slight downtrend but not concerning, no need for transfusion, no need for further testing or admission.  Appropriate for discharge.  Procedures  Final Clinical Impressions(s) / ED Diagnoses     ICD-10-CM   1. Bleeding  R58       ED Discharge Orders     None         Discharge Instructions      You were evaluated in the Emergency Department and after careful evaluation, we did not find any emergent condition requiring admission or further testing in the hospital.  Your exam/testing today is overall reassuring.  With the help of the vascular surgeons we were able to stop your bleeding.  Recommend taking it easy over the next few days and following up with your primary care doctor.  Please return to the Emergency Department if you experience any worsening of your condition.   Thank you for allowing us  to be a part of your care.      Ozell HERO. Theadore, MD [facility] Emergency Medicine [facility] [email or web address]    Theadore Ozell HERO, MD 05/19/24 BEATRIS

## 2024-05-18 NOTE — ED Provider Notes (Signed)
 Emergency Department Provider Note   I have reviewed the triage vital signs and the nursing notes.   HISTORY  Chief Complaint Vascular Access Problem   HPI Anna Carr is a 83 y.o. female with past history reviewed below presents to the emergency department with bleeding from her tunneled HD cath placed today.  Catheter placement is in the right chest.  It was placed by Dr. Pearline without apparent complication.  She states she went home and was laying down in her stomach when she woke up it was covered in blood.  Denies any pain.  Due for dialysis tomorrow.     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 difficile colitis 03/30/2021   Cardiac arrest ([name]) 03/02/2024   Cellulitis 03/16/2021   CHF with left ventricular diastolic dysfunction, NYHA class 1 ([name]) 02/02/2017   Cholelithiasis 03/30/2021   Chronic constipation 12/22/2015   Chronic laryngitis 02/17/2017   Last Assessment & Plan:   Formatting of this note might be different from the original.  Evaluation of larynx.  Was hospitalized for pneumonia and during that time she started noticing worsening hoarseness.  Denies any throat pain.  No obvious heartburn.  She did recently start PPI therapy.  She smoked in the past.  Denies any difficulty swallowing.  Hoarseness is  improving.  EXAM shows mildly ras   Coagulation defect, unspecified ([name]) 12/07/2019   Colon polyp    Complication of vascular dialysis catheter 03/05/2020   Contact with and (suspected) exposure to covid-19 10/28/2020   COPD mixed type ([name])    Cough, unspecified 10/28/2020   COVID-19 10/29/2020   Depression    Diverticulosis of large intestine without perforation or abscess without bleeding 01/08/2020   Dyspnea on exertion    Dyspnea, unspecified 11/02/2019   Elevated troponin 03/16/2021   Encounter for removal of sutures 12/28/2019   Encounter for screening for respiratory tuberculosis 12/06/2019   ESRD (end stage renal disease) ([name]) 07/18/2019   ESRD on dialysis ([name]) 09/18/2019   ESRD on hemodialysis ([name]) 01/02/2020   Fever, unspecified 01/23/2020   GI bleed 12/19/2021   GIB (gastrointestinal bleeding) 01/02/2020   Heme positive stool    High risk medication use 12/22/2015   History of blood transfusion    History of colonic polyps 01/08/2020   HLD (hyperlipidemia) 12/22/2015   Hypertension    Hypervolemia 11/13/2019   Hypokalemia 03/11/2019   Hyponatremia 03/11/2019   Hypovolemia 10/27/2021   Hypoxia 03/11/2019   Insomnia    Insulin  Karaline Buresh-term use ([name]) 12/22/2015   Iron  deficiency anemia, unspecified 12/06/2019   Major depressive disorder, recurrent ([name]) 12/22/2015   Malaise and fatigue 12/23/2015   Mild protein-calorie malnutrition ([name]) 03/26/2020   Mitral regurgitation moderate to severe based on echocardiogram from 2022 11/03/2021   Musculoskeletal chest pain 03/03/2024   Osteoarthritis    Osteopenia  Other disorders of phosphorus metabolism 01/08/2020   PAC (premature atrial contraction) 10/04/2020   Pain, unspecified 12/06/2019   PAT (paroxysmal atrial tachycardia) ([name]) 10/04/2020   Peripheral vascular disease, unspecified ([name]) critical stenosis lower extremity 12/01/2023   Peripheral arterial disease     Pneumonia    Primary osteoarthritis  12/22/2015   Pruritus, unspecified 12/06/2019   Pulmonary hypertension, unspecified ([name]) 11/03/2021   Pulmonary nodule 06/09/2023   Appears very slow growing.  Chodri CT annually.     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 Respiratory: Denies shortness of breath. Gastrointestinal: No abdominal pain.  No nausea, no vomiting.   Skin: Positive bleeding form HD cath.  Neurological: Negative for headaches.  ____________________________________________   PHYSICAL EXAM:  VITAL SIGNS: ED Triage Vitals  Encounter Vitals Group     BP 05/18/24 1928 (!) 142/70     Pulse Rate 05/18/24 1928 91     Resp 05/18/24 1928 19     Temp 05/18/24 1928 (!) 97.3 F (36.3 C)     Temp Source 05/18/24 1928 Oral     SpO2 05/18/24 1928 96 %     Weight 05/18/24 1929 159 lb (72.1 kg)     Height 05/18/24 1929 5' 1 (1.549 m)   Constitutional: Alert and oriented. Well appearing and in no acute distress. Eyes: Conjunctivae are normal.  Head: Atraumatic. Nose: No congestion/rhinnorhea. Mouth/Throat: Mucous membranes are moist.  Neck: No stridor.   Cardiovascular: Normal rate, regular rhythm. Good peripheral circulation. Grossly normal heart sounds.   Respiratory: Normal respiratory effort.  No retractions. Lungs CTAB. Gastrointestinal: Soft and nontender. No distention.  Musculoskeletal: No lower extremity tenderness nor edema. No gross deformities of extremities. Neurologic:  Normal speech and language. No gross focal neurologic deficits are  appreciated.  Skin:  Skin is warm, dry and intact.  No apparent bleeding from the insertion site.  Question some bleeding from the suture site.  The line was wrapped and bloodsoaked gauze by EMS.  I have replaced this and will monitor in the ED.  ____________________________________________  RADIOLOGY  PERIPHERAL VASCULAR CATHETERIZATION Result Date: 05/18/2024 Images from the original result were not included. Patient name: Anna Carr MRN: [id number] DOB: [birth year] Sex: female 05/18/2024 Pre-operative Diagnosis: ESRD on HD Post-operative diagnosis:  Same Surgeon:  Norman GORMAN Serve, MD Procedure Performed: Ultrasound and fluoroscopic guided right internal jugular tunneled dialysis catheter placement Indications: Ms. Kesselman is an 83 year old female with ESRD on HD who presents to the HD access center today for tunneled dialysis catheter placement.  She underwent thrombectomy of her left arm AV graft yesterday.  On completion pictures the graft was widely patent although there was sluggish flow in the central system.  This was thought to be due to moderate to severe tricuspid regurgitation as multiple views were obtained which did not demonstrate any central stenosis or clot.  We got called today that her graft was rethrombosed and she presents to the access center today for placement of a tunneled access catheter.  There are some benefits were reviewed and she elected to proceed. Findings: Widely patent and compressible  right internal jugular vein Successful placement of tunneled dialysis catheter at atriocaval junction  Procedure:  The patient was identified in the holding area and taken to the cath lab  The patient was then placed supine on the table and prepped and draped in the usual sterile fashion.  A time out was called.  Using ultrasound guidance the right internal jugular vein was accessed with micropuncture technique.  Through the micropuncture sheath, the guidewire was advanced into the  superior vena cava.  A small incision was made around the skin access point.  The access point was serially dilated under direct fluoroscopic guidance.  A peel-away sheath was introduced into the superior vena cava under fluoroscopic guidance.  A counterincision was made in the chest under the clavicle.  A 19 cm tunnel dialysis catheter was then tunneled under the skin, over the clavicle into the incision in the neck.  The tunneling device was removed and the catheter fed through the peel-away sheath into the superior vena cava.  The peel-away sheath was removed and the catheter gently pulled back.  Adequate position was confirmed with x-ray.  The catheter was tested and found to aspirate and flush with ease.    The catheter was sutured to the skin and the neck incision was closed with 4-0 Monocryl.  The catheter was then capped and heparin  locked. Impression: Successful placement of 19 cm dialysis catheter at atriocaval junction Norman GORMAN Serve MD [facility] Office: [phone]    ____________________________________________   PROCEDURES  Procedure(s) performed:   Procedures  None  ____________________________________________   INITIAL IMPRESSION / ASSESSMENT AND PLAN / ED COURSE  Pertinent labs & imaging results that were available during my care of the patient were reviewed by me and considered in my medical decision making (see chart for details).   This patient is Presenting for Evaluation of HC cath bleeding, which does require a range of treatment options, and is a complaint that involves a high risk of morbidity and mortality.  The Differential Diagnoses include bleeding from access site, dislodgment of line, symptomatic anemia, etc.  I *** Additional Historical Information from ***, as the patient is ***.  I decided to review pertinent External Data, and in summary ***.   Clinical Laboratory Tests Ordered, included   Radiologic Tests Ordered,  included ***. I independently interpreted the images and agree with radiology interpretation.   Cardiac Monitor Tracing which shows ***   Social Determinants of Health Risk ***  Consult complete with  Medical Decision Making: Summary:  I have changed out the bloody dressings placed by EMS around the line.  It is sutured in place and appears to not be dislodged.  I will obtain a chest x-ray and monitor for continued bleeding in the ED.   Reevaluation with update and discussion with   ***Considered admission***  Patient's presentation is most consistent with acute presentation with potential threat to life or bodily function.   Disposition:   ____________________________________________  FINAL CLINICAL IMPRESSION(S) / ED DIAGNOSES  Final diagnoses:  None     NEW OUTPATIENT MEDICATIONS STARTED DURING THIS VISIT:  New Prescriptions   No medications on file    Note:  This document was prepared using Dragon voice recognition software and may include unintentional dictation errors.  Fonda Law, MD, [facility] Emergency [facility]

## 2024-05-18 NOTE — H&P (Signed)
 HD ACCESS CENTER H&P   Patient ID: Anna Carr, female   DOB: [birth year], 83 y.o.   MRN: [id number]  Subjective:     HPI Anna Carr is a 83 y.o. female with ESRD presenting to the HD access center for intervention.  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 difficile colitis 03/30/2021   Cardiac arrest ([name]) 03/02/2024   Cellulitis 03/16/2021   CHF with left ventricular diastolic dysfunction, NYHA class 1 ([name]) 02/02/2017   Cholelithiasis 03/30/2021   Chronic constipation 12/22/2015   Chronic laryngitis 02/17/2017   Last Assessment & Plan:   Formatting of this note might be different from the original.  Evaluation of larynx.  Was hospitalized for pneumonia and during that time she started noticing worsening hoarseness.  Denies any throat pain.  No obvious heartburn.  She did recently start PPI therapy.  She smoked in the past.  Denies any difficulty swallowing.  Hoarseness is improving.  EXAM shows mildly ras   Coagulation defect, unspecified ([name]) 12/07/2019   Colon polyp    Complication of vascular dialysis catheter 03/05/2020   Contact with and (suspected) exposure to covid-19 10/28/2020   COPD mixed type ([name])    Cough, unspecified 10/28/2020   COVID-19 10/29/2020   Depression    Diverticulosis of large  intestine without perforation or abscess without bleeding 01/08/2020   Dyspnea on exertion    Dyspnea, unspecified 11/02/2019   Elevated troponin 03/16/2021   Encounter for removal of sutures 12/28/2019   Encounter for screening for respiratory tuberculosis 12/06/2019   ESRD (end stage renal disease) ([name]) 07/18/2019   ESRD on dialysis ([name]) 09/18/2019   ESRD on hemodialysis ([name]) 01/02/2020   Fever, unspecified 01/23/2020   GI bleed 12/19/2021   GIB (gastrointestinal bleeding) 01/02/2020   Heme positive stool    High risk medication use 12/22/2015   History of blood transfusion    History of colonic polyps 01/08/2020   HLD (hyperlipidemia) 12/22/2015   Hypertension    Hypervolemia 11/13/2019   Hypokalemia 03/11/2019   Hyponatremia 03/11/2019   Hypovolemia 10/27/2021   Hypoxia 03/11/2019   Insomnia    Insulin  long-term use ([name]) 12/22/2015   Iron  deficiency anemia, unspecified 12/06/2019   Major depressive disorder, recurrent ([name]) 12/22/2015   Malaise and fatigue 12/23/2015   Mild protein-calorie malnutrition ([name]) 03/26/2020   Mitral regurgitation moderate to severe based on echocardiogram from 2022 11/03/2021   Musculoskeletal chest pain 03/03/2024   Osteoarthritis    Osteopenia    Other disorders of phosphorus metabolism 01/08/2020   PAC (premature atrial contraction) 10/04/2020   Pain, unspecified 12/06/2019   PAT (paroxysmal atrial tachycardia) ([name]) 10/04/2020   Peripheral vascular disease, unspecified ([name]) critical stenosis lower extremity 12/01/2023   Peripheral arterial disease     Pneumonia    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   Pruritus, unspecified 12/06/2019  Pulmonary hypertension, unspecified ([name]) 11/03/2021   Pulmonary nodule 06/09/2023   Appears very slow growing.  Chodri CT annually.     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   Symptomatic anemia 02/28/2024    Type 2 diabetes mellitus with chronic kidney disease on chronic dialysis, with long-term current use of insulin  ([name]) 12/23/2015   Type 2 diabetes mellitus, with long-term current use of insulin  ([name]) 03/11/2019   Unspecified hemorrhoids 01/08/2020   Family History  Problem Relation Age of Onset   Diabetes Mother    Hypertension Mother    Diabetes Maternal Grandfather    Liver disease Other    Past Surgical History:  Procedure Laterality Date   A/V FISTULAGRAM Left 02/17/2021   Procedure: A/V FISTULAGRAM;  Surgeon: Sheree Penne Bruckner, MD;  Location: [facility] INVASIVE CV LAB;  Service: Cardiovascular;  Laterality: Left;   APPENDECTOMY     AV FISTULA PLACEMENT Left    AV FISTULA PLACEMENT Left 03/12/2021   Procedure: INSERTION OF LEFT UPPER ARM ARTERIOVENOUS (AV) GORE-TEX GRAFT;  Surgeon: Sheree Penne Bruckner, MD;  Location: [facility] OR;  Service: Vascular;  Laterality: Left;   BALLOON ENTEROSCOPY  03/02/2024   Procedure: ENTEROSCOPY, USING BALLOON;  Surgeon: San Sandor GAILS, DO;  Location: [facility] ENDOSCOPY;  Service: Gastroenterology;;   COLONOSCOPY WITH PROPOFOL  N/A 03/18/2021   Procedure: COLONOSCOPY WITH PROPOFOL ;  Surgeon: Legrand Victory LITTIE DOUGLAS, MD;  Location: [facility] ENDOSCOPY;  Service: Gastroenterology;  Laterality: N/A;   CYSTECTOMY     ENTEROSCOPY N/A 03/21/2021   Procedure: ENTEROSCOPY;  Surgeon: Legrand Victory LITTIE DOUGLAS, MD;  Location: [facility] ENDOSCOPY;  Service: Gastroenterology;  Laterality: N/A;   ENTEROSCOPY N/A 12/20/2021   Procedure: ENTEROSCOPY;  Surgeon: Legrand Victory LITTIE DOUGLAS, MD;  Location: [facility] ENDOSCOPY;  Service: Gastroenterology;  Laterality: N/A;   ENTEROSCOPY N/A 02/26/2024   Procedure: ENTEROSCOPY;  Surgeon: Albertus Gordy HERO, MD;  Location: [facility] ENDOSCOPY;  Service: Gastroenterology;  Laterality: N/A;   ENTEROSCOPY N/A 02/28/2024   Procedure: ENTEROSCOPY;  Surgeon: San Sandor GAILS, DO;  Location: [facility] ENDOSCOPY;  Service: Gastroenterology;  Laterality: N/A;   ESOPHAGOGASTRODUODENOSCOPY  (EGD) WITH PROPOFOL  N/A 03/18/2021   Procedure: ESOPHAGOGASTRODUODENOSCOPY (EGD) WITH PROPOFOL ;  Surgeon: Legrand Victory LITTIE DOUGLAS, MD;  Location: [facility] ENDOSCOPY;  Service: Gastroenterology;  Laterality: N/A;   GIVENS CAPSULE STUDY N/A 03/19/2021   Procedure: GIVENS CAPSULE STUDY;  Surgeon: Legrand Victory LITTIE DOUGLAS, MD;  [facility];  Service: Gastroenterology;  Laterality: N/A;   GIVENS CAPSULE STUDY N/A 02/29/2024   Procedure: IMAGING PROCEDURE, GI TRACT, INTRALUMINAL, VIA CAPSULE;  Surgeon: San Sandor GAILS, DO;  Location: [facility] ENDOSCOPY;  Service: Gastroenterology;  Laterality: N/A;   HEMOSTASIS CLIP PLACEMENT  12/20/2021   Procedure: HEMOSTASIS CLIP PLACEMENT;  Surgeon: Legrand Victory LITTIE DOUGLAS, MD;  Location: [facility] ENDOSCOPY;  Service: Gastroenterology;;   HOT HEMOSTASIS N/A 03/21/2021   Procedure: HOT HEMOSTASIS (ARGON PLASMA COAGULATION/BICAP);  Surgeon: Legrand Victory LITTIE DOUGLAS, MD;  Location: [facility] ENDOSCOPY;  Service: Gastroenterology;  Laterality: N/A;   HOT HEMOSTASIS N/A 12/20/2021   Procedure: HOT HEMOSTASIS (ARGON PLASMA COAGULATION/BICAP);  Surgeon: Legrand Victory LITTIE DOUGLAS, MD;  Location: [facility] ENDOSCOPY;  Service: Gastroenterology;  Laterality: N/A;   HOT HEMOSTASIS N/A 03/02/2024   Procedure: EGD, WITH ARGON PLASMA COAGULATION;  Surgeon: San Sandor GAILS, DO;  Location: [facility] ENDOSCOPY;  Service: Gastroenterology;  Laterality: N/A;   HYSTEROSCOPY     IR THROMBECTOMY AV FISTULA W/THROMBOLYSIS/PTA INC/SHUNT/IMG LEFT Left 04/12/2024   IR  US  GUIDE VASC ACCESS LEFT  04/12/2024   LOWER EXTREMITY ANGIOGRAPHY N/A 02/03/2024   Procedure: Lower Extremity Angiography;  Surgeon: Court Dorn PARAS, MD;  Location: [facility] INVASIVE CV LAB;  Service: Cardiovascular;  Laterality: N/A;   PERIPHERAL VASCULAR THROMBECTOMY Left 05/17/2024   Procedure: PERIPHERAL VASCULAR THROMBECTOMY;  Surgeon: Pearline Norman RAMAN, MD;  Location: [facility] LAB;  Service: Cardiovascular;  Laterality: Left;   POLYPECTOMY  03/18/2021   Procedure: POLYPECTOMY;   Surgeon: Legrand Victory LITTIE DOUGLAS, MD;  Location: [facility] ENDOSCOPY;  Service: Gastroenterology;;    Short Social History:  Social History   Tobacco Use   Smoking status: Former    Current packs/day: 0.00    Types: Cigarettes    Quit date: 1983    Years since quitting: 42.5   Smokeless tobacco: Never   Tobacco comments:    quit in 1973  Substance Use Topics   Alcohol  use: Not Currently    Allergies  Allergen Reactions   Atorvastatin  Other (See Comments)    Constipation   Wound Dressing Adhesive Rash    No current facility-administered medications for this encounter.    REVIEW OF SYSTEMS All other systems were reviewed and are negative     Objective:   Objective   There were no vitals filed for this visit. There is no height or weight on file to calculate BMI.  Physical Exam General: no acute distress Cardiac: hemodynamically stable Extremities: Left arm AVG without thrill      Assessment/Plan:   Anna Carr is a 83 y.o. female with ESRD presenting for tunneled dialysis catheter placement. She a thrombectomy of her left arm AV graft yesterday.  The graft was widely patent on completion although there was sluggish flow in the central system.  Multiple dedicated venograms of the central system were obtained which did not demonstrate any stenosis or clot but there was stagnant to and fro flow which was presumed to be from her moderate to severe tricuspid regurg..   Reviewed risks and benefits of tunneled dialysis catheter placement and patient agreed to proceed.   Norman Pearline, MD [facility]

## 2024-05-18 NOTE — ED Notes (Signed)
 Dialysis port noted to be bleeding again, Dr. Darra made aware.

## 2024-05-18 NOTE — ED Triage Notes (Signed)
 Patient brought in by [facility] from home with c/o dialysis port bleeding. Patient had it placed today and it has a steady slow flow. Patient denies any pain at this time to that area. Per EMS patient was hypotensive on arrival they started 18g ro RAC and infused 150ml of NS. BP increased to 140/78. Patient stated she stopped her blood thinner to have the port placed however she don't remember when.

## 2024-05-18 NOTE — Telephone Encounter (Signed)
 Daughter called regarding follow-up appts with possible infusion and lab results.

## 2024-05-18 NOTE — ED Notes (Signed)
 Pt ambulatory to restroom

## 2024-05-19 ENCOUNTER — Telehealth: Payer: Self-pay

## 2024-05-19 ENCOUNTER — Telehealth: Payer: Self-pay | Admitting: *Deleted

## 2024-05-19 ENCOUNTER — Encounter (HOSPITAL_COMMUNITY): Payer: Self-pay | Admitting: Vascular Surgery

## 2024-05-19 LAB — CBC WITH DIFFERENTIAL/PLATELET
Abs Immature Granulocytes: 0.03 K/uL (ref 0.00–0.07)
Basophils Absolute: 0.1 K/uL (ref 0.0–0.1)
Basophils Relative: 1 %
Eosinophils Absolute: 0.1 K/uL (ref 0.0–0.5)
Eosinophils Relative: 1 %
HCT: 29.9 % — ABNORMAL LOW (ref 36.0–46.0)
Hemoglobin: 9.3 g/dL — ABNORMAL LOW (ref 12.0–15.0)
Immature Granulocytes: 0 %
Lymphocytes Relative: 15 %
Lymphs Abs: 1.2 K/uL (ref 0.7–4.0)
MCH: 32.2 pg (ref 26.0–34.0)
MCHC: 31.1 g/dL (ref 30.0–36.0)
MCV: 103.5 fL — ABNORMAL HIGH (ref 80.0–100.0)
Monocytes Absolute: 0.6 K/uL (ref 0.1–1.0)
Monocytes Relative: 7 %
Neutro Abs: 6 K/uL (ref 1.7–7.7)
Neutrophils Relative %: 76 %
Platelets: 156 K/uL (ref 150–400)
RBC: 2.89 MIL/uL — ABNORMAL LOW (ref 3.87–5.11)
RDW: 20 % — ABNORMAL HIGH (ref 11.5–15.5)
WBC: 7.9 K/uL (ref 4.0–10.5)
nRBC: 0 % (ref 0.0–0.2)

## 2024-05-19 NOTE — Telephone Encounter (Signed)
 Copied from CRM [id number]. Topic: Clinical - Home Health Verbal Orders >> May 19, 2024 12:11 PM Susanna ORN wrote: Caller/Agency: Oneil Biles, Physical Therapist Callback Number: [phone]; secured line & stated a voicemail can be left with orders. Service Requested: Physical Therapy Frequency: Order to move PT evaluation to start the week of 05/21/24. Any new concerns about the patient? No

## 2024-05-19 NOTE — Telephone Encounter (Signed)
 TSH results have been sent to Dr. Thurmond.

## 2024-05-19 NOTE — Telephone Encounter (Signed)
 Patient has been scheduled. Aware of appt date and time.

## 2024-05-19 NOTE — Telephone Encounter (Signed)
-----   Message from Andrez DELENA Foy sent at 05/19/2024  4:18 PM EDT ----- Please fax TSH to Dr. Thurmond with note to see if he will manage. Found during evaluation for anemia. Thanks

## 2024-05-19 NOTE — Telephone Encounter (Signed)
 Contacted pt to schedule an appt. Unable to reach via phone, voicemail was left.

## 2024-05-19 NOTE — Telephone Encounter (Signed)
 Verneita patients daughter called to make us  aware , patient was seen in the ER at [facility]. Her recently placed tunneled HD line was oozing and stitches were placed. She wanted to let us  know and make us  aware her blood count had dropped some from the blood loss. Also wanting to schedule iron  infusions. Arland here got approval and Damaris aware.

## 2024-05-19 NOTE — Discharge Instructions (Signed)
 You were evaluated in the Emergency Department and after careful evaluation, we did not find any emergent condition requiring admission or further testing in the hospital.  Your exam/testing today is overall reassuring.  With the help of the vascular surgeons we were able to stop your bleeding.  Recommend taking it easy over the next few days and following up with your primary care doctor.  Please return to the Emergency Department if you experience any worsening of your condition.   Thank you for allowing us  to be a part of your care.

## 2024-05-23 NOTE — Telephone Encounter (Signed)
 Scheduled for next Friday at 11.

## 2024-05-24 ENCOUNTER — Inpatient Hospital Stay

## 2024-05-24 VITALS — BP 115/60 | HR 88 | Temp 97.7°F | Resp 18

## 2024-05-24 DIAGNOSIS — D500 Iron deficiency anemia secondary to blood loss (chronic): Secondary | ICD-10-CM | POA: Diagnosis not present

## 2024-05-24 DIAGNOSIS — N189 Chronic kidney disease, unspecified: Secondary | ICD-10-CM

## 2024-05-24 MED ORDER — SODIUM CHLORIDE 0.9 % IV SOLN
510.0000 mg | Freq: Once | INTRAVENOUS | Status: AC
  Administered 2024-05-24: 510 mg via INTRAVENOUS
  Filled 2024-05-24: qty 510

## 2024-05-24 MED ORDER — LORATADINE 10 MG PO TABS
10.0000 mg | ORAL_TABLET | Freq: Once | ORAL | Status: AC
Start: 2024-05-24 — End: 2024-05-24
  Administered 2024-05-24: 10 mg via ORAL
  Filled 2024-05-24: qty 1

## 2024-05-24 MED ORDER — ACETAMINOPHEN 325 MG PO TABS
650.0000 mg | ORAL_TABLET | Freq: Once | ORAL | Status: AC
Start: 2024-05-24 — End: 2024-05-24
  Administered 2024-05-24: 650 mg via ORAL
  Filled 2024-05-24: qty 2

## 2024-05-24 MED ORDER — SODIUM CHLORIDE 0.9 % IV SOLN: INTRAVENOUS | Status: DC

## 2024-05-24 NOTE — Patient Instructions (Signed)

## 2024-05-25 NOTE — Telephone Encounter (Signed)
 Called Kate with [facility] and gave verbal orders for PT 1 x a week x 8 weeks as approved by PCP.

## 2024-05-25 NOTE — Telephone Encounter (Signed)
 These are always okay if they are our patient and have a follow up planned ideally within a month.  As this patient is okay on both of these you may approve without forwarding.

## 2024-05-25 NOTE — Progress Notes (Unsigned)
 Patient ID: Anna Carr, female   DOB: [birth year], 83 y.o.   MRN: [id number]  Reason for Consult: No chief complaint on file.   Referred by Thurmond Cathlyn LABOR., MD  Subjective:     HPI DELOISE MARCHANT is a 83 y.o. female presenting for follow-up.  She was seen in consult by Dr. Lanis for a right foot wound.  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  She was offered above-knee amputation but was explained that it was not urgent with plan to rediscuss in follow-up. 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 difficile colitis 03/30/2021   Cardiac arrest ([name]) 03/02/2024   Cellulitis 03/16/2021   CHF with left ventricular diastolic dysfunction, NYHA class 1 ([name]) 02/02/2017   Cholelithiasis 03/30/2021   Chronic constipation 12/22/2015   Chronic laryngitis 02/17/2017   Last Assessment & Plan:   Formatting of this note might be different from the original.  Evaluation of larynx.  Was hospitalized for pneumonia and during that time she started noticing worsening hoarseness.  Denies any throat pain.  No obvious heartburn.  She did recently start PPI therapy.  She smoked in the past.  Denies any difficulty  swallowing.  Hoarseness is improving.  EXAM shows mildly ras   Coagulation defect, unspecified ([name]) 12/07/2019   Colon polyp    Complication of vascular dialysis catheter 03/05/2020   Contact with and (suspected) exposure to covid-19 10/28/2020   COPD mixed type ([name])    Cough, unspecified 10/28/2020   COVID-19 10/29/2020   Depression    Diverticulosis of large intestine without perforation or abscess without bleeding 01/08/2020   Dyspnea on exertion    Dyspnea, unspecified 11/02/2019   Elevated troponin 03/16/2021   Encounter for removal of sutures 12/28/2019   Encounter for screening for respiratory tuberculosis 12/06/2019   ESRD (end stage renal disease) ([name]) 07/18/2019   ESRD on dialysis ([name]) 09/18/2019   ESRD on hemodialysis ([name]) 01/02/2020   Fever, unspecified 01/23/2020   GI bleed 12/19/2021   GIB (gastrointestinal bleeding) 01/02/2020   Heme positive stool    High risk medication use 12/22/2015   History of blood transfusion    History of colonic polyps 01/08/2020   HLD (hyperlipidemia) 12/22/2015   Hypertension    Hypervolemia 11/13/2019   Hypokalemia 03/11/2019   Hyponatremia 03/11/2019   Hypovolemia 10/27/2021   Hypoxia 03/11/2019   Insomnia    Insulin  long-term use ([name]) 12/22/2015   Iron  deficiency anemia, unspecified 12/06/2019   Major depressive disorder, recurrent ([name]) 12/22/2015   Malaise and fatigue 12/23/2015   Mild protein-calorie malnutrition ([name]) 03/26/2020   Mitral regurgitation moderate to severe based on echocardiogram from 2022 11/03/2021   Musculoskeletal chest pain 03/03/2024  Osteoarthritis    Osteopenia    Other disorders of phosphorus metabolism 01/08/2020   PAC (premature atrial contraction) 10/04/2020   Pain, unspecified 12/06/2019   PAT (paroxysmal atrial tachycardia) ([name]) 10/04/2020   Peripheral vascular disease, unspecified ([name]) critical stenosis lower extremity 12/01/2023   Peripheral arterial disease     Pneumonia     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  Chodri CT annually.     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 dialysis, with long-term current use of insulin  ([name]) 12/23/2015   Type 2 diabetes mellitus, with long-term current use of insulin  ([name]) 03/11/2019   Unspecified hemorrhoids 01/08/2020   Family History  Problem Relation Age of Onset   Diabetes Mother    Hypertension Mother    Diabetes Maternal Grandfather    Liver disease Other    Past Surgical History:  Procedure Laterality Date   A/V FISTULAGRAM Left 02/17/2021   Procedure: A/V FISTULAGRAM;  Surgeon: Sheree Penne Bruckner, MD;  Location: [facility] INVASIVE CV LAB;  Service: Cardiovascular;  Laterality: Left;   APPENDECTOMY     AV FISTULA PLACEMENT Left    AV FISTULA PLACEMENT Left 03/12/2021   Procedure: INSERTION OF LEFT UPPER ARM ARTERIOVENOUS (AV) GORE-TEX GRAFT;  Surgeon: Sheree Penne Bruckner, MD;  Location: [facility] OR;  Service: Vascular;  Laterality: Left;   BALLOON ENTEROSCOPY  03/02/2024   Procedure: ENTEROSCOPY, USING BALLOON;  Surgeon: San Sandor GAILS, DO;  Location: [facility] ENDOSCOPY;  Service: Gastroenterology;;   COLONOSCOPY WITH PROPOFOL  N/A 03/18/2021   Procedure: COLONOSCOPY WITH PROPOFOL ;  Surgeon: Legrand Victory LITTIE DOUGLAS, MD;  Location: [facility] ENDOSCOPY;  Service: Gastroenterology;  Laterality: N/A;   CYSTECTOMY     DIALYSIS/PERMA CATHETER INSERTION N/A 05/18/2024   Procedure: DIALYSIS/PERMA CATHETER INSERTION;  Surgeon: Pearline Norman RAMAN, MD;  Location: [facility] PV LAB;  Service: Cardiovascular;  Laterality: N/A;   ENTEROSCOPY N/A 03/21/2021   Procedure: ENTEROSCOPY;  Surgeon: Legrand Victory LITTIE DOUGLAS, MD;   Location: [facility] ENDOSCOPY;  Service: Gastroenterology;  Laterality: N/A;   ENTEROSCOPY N/A 12/20/2021   Procedure: ENTEROSCOPY;  Surgeon: Legrand Victory LITTIE DOUGLAS, MD;  Location: [facility] ENDOSCOPY;  Service: Gastroenterology;  Laterality: N/A;   ENTEROSCOPY N/A 02/26/2024   Procedure: ENTEROSCOPY;  Surgeon: Albertus Gordy HERO, MD;  Location: [facility] ENDOSCOPY;  Service: Gastroenterology;  Laterality: N/A;   ENTEROSCOPY N/A 02/28/2024   Procedure: ENTEROSCOPY;  Surgeon: San Sandor GAILS, DO;  Location: [facility] ENDOSCOPY;  Service: Gastroenterology;  Laterality: N/A;   ESOPHAGOGASTRODUODENOSCOPY (EGD) WITH PROPOFOL  N/A 03/18/2021   Procedure: ESOPHAGOGASTRODUODENOSCOPY (EGD) WITH PROPOFOL ;  Surgeon: Legrand Victory LITTIE DOUGLAS, MD;  Location: [facility] ENDOSCOPY;  Service: Gastroenterology;  Laterality: N/A;   GIVENS CAPSULE STUDY N/A 03/19/2021   Procedure: GIVENS CAPSULE STUDY;  Surgeon: Legrand Victory LITTIE DOUGLAS, MD;  Location: [facility] ENDOSCOPY;  Service: Gastroenterology;  Laterality: N/A;   GIVENS CAPSULE STUDY N/A 02/29/2024   Procedure: IMAGING PROCEDURE, GI TRACT, INTRALUMINAL, VIA CAPSULE;  Surgeon: San Sandor GAILS, DO;  Location: [facility] ENDOSCOPY;  Service: Gastroenterology;  Laterality: N/A;   HEMOSTASIS CLIP PLACEMENT  12/20/2021   Procedure: HEMOSTASIS CLIP PLACEMENT;  Surgeon: Legrand Victory LITTIE DOUGLAS, MD;  Location: [facility] ENDOSCOPY;  Service: Gastroenterology;;   HOT HEMOSTASIS N/A 03/21/2021   Procedure: HOT HEMOSTASIS (ARGON  PLASMA COAGULATION/BICAP);  Surgeon: Legrand Victory LITTIE DOUGLAS, MD;  Location: [facility] ENDOSCOPY;  Service: Gastroenterology;  Laterality: N/A;   HOT HEMOSTASIS N/A 12/20/2021   Procedure: HOT HEMOSTASIS (ARGON PLASMA COAGULATION/BICAP);  Surgeon: Legrand Victory LITTIE DOUGLAS, MD;  Location: [facility] ENDOSCOPY;  Service: Gastroenterology;  Laterality: N/A;   HOT HEMOSTASIS N/A 03/02/2024   Procedure: EGD, WITH ARGON PLASMA COAGULATION;  Surgeon: San Sandor GAILS, DO;  Location: [facility] ENDOSCOPY;  Service: Gastroenterology;  Laterality: N/A;   HYSTEROSCOPY     IR  THROMBECTOMY AV FISTULA W/THROMBOLYSIS/PTA INC/SHUNT/IMG LEFT Left 04/12/2024   IR US  GUIDE VASC ACCESS LEFT  04/12/2024   LOWER EXTREMITY ANGIOGRAPHY N/A 02/03/2024   Procedure: Lower Extremity Angiography;  Surgeon: Court Dorn PARAS, MD;  Location: [facility] INVASIVE CV LAB;  Service: Cardiovascular;  Laterality: N/A;   PERIPHERAL VASCULAR THROMBECTOMY Left 05/17/2024   Procedure: PERIPHERAL VASCULAR THROMBECTOMY;  Surgeon: Pearline Norman RAMAN, MD;  Location: [facility];  Service: Cardiovascular;  Laterality: Left;   POLYPECTOMY  03/18/2021   Procedure: POLYPECTOMY;  Surgeon: Legrand Victory LITTIE DOUGLAS, MD;  Location: [facility] ENDOSCOPY;  Service: Gastroenterology;;    Short Social History:  Social History   Tobacco Use   Smoking status: Former    Current packs/day: 0.00    Types: Cigarettes    Quit date: 1983    Years since quitting: 42.6   Smokeless tobacco: Never   Tobacco comments:    quit in 1973  Substance Use Topics   Alcohol  use: Not Currently    Allergies  Allergen Reactions   Atorvastatin  Other (See Comments)    Constipation   Wound Dressing Adhesive Rash    Current Outpatient Medications  Medication Sig Dispense Refill   acetaminophen  (TYLENOL ) 500 MG tablet Take 2 tablets (1,000 mg total) by mouth every 8 (eight) hours as needed for mild pain (pain score 1-3) or moderate pain (pain score 4-6).     ascorbic acid  (VITAMIN C ) 1000 MG tablet Take 1,000 mg by mouth at bedtime.     Biotin  5000 MCG TABS Take 5,000 mcg by mouth daily in the afternoon.     camphor-menthol  (SARNA) lotion Apply 1 Application topically as needed for itching. 222 mL 0   carboxymethylcellulose (REFRESH PLUS) 0.5 % SOLN Place 1 drop into both eyes 3 (three) times daily as needed (dry eyes).     cyanocobalamin  (VITAMIN B12) 1000 MCG tablet Take 1,000 mcg by mouth every evening.     ferric citrate  (AURYXIA ) 1 GM 210 MG(Fe) tablet Take 210 mg by mouth 3 (three) times daily with meals.     furosemide  (LASIX ) 80 MG tablet  Take 80 mg by mouth daily in the afternoon.     Insulin  Glargine-Lixisenatide  (SOLIQUA ) 100-33 UNT-MCG/ML SOPN Inject 6 Units into the skin in the morning.     Lactobacillus (PROBIOTIC ACIDOPHILUS PO) Take 1 capsule by mouth in the morning.     lidocaine -prilocaine  (EMLA ) cream Apply 1 application topically as needed (prior to fisula access). Tuesday, Thursday, Saturday for dialysis     Methoxy PEG-Epoetin Beta (MIRCERA IJ) 225 mcg.     midodrine  (PROAMATINE ) 10 MG tablet Take 10 mg by mouth 3 (three) times daily.     Omega-3 Fatty Acids (FISH OIL PO) Take 1 capsule by mouth in the morning and at bedtime.     ondansetron  (ZOFRAN ) 8 MG tablet Take 1 tablet (8 mg total) by mouth every 8 (eight) hours as needed for nausea. 30 tablet 3   ondansetron  (ZOFRAN -ODT) 4 MG  disintegrating tablet Take 4 mg by mouth every 8 (eight) hours as needed.     oxyCODONE  (ROXICODONE ) 5 MG/5ML solution Take 5 mg by mouth.     pantoprazole  (PROTONIX ) 40 MG tablet Take 1 tablet (40 mg total) by mouth 2 (two) times daily. 60 tablet 0   polyethylene glycol powder (GLYCOLAX /MIRALAX ) 17 GM/SCOOP powder Take 17 g by mouth daily. 238 g 0   rosuvastatin  (CRESTOR ) 10 MG tablet Take 1 tablet (10 mg total) by mouth daily. 30 tablet 0   senna (SENOKOT) 8.6 MG TABS tablet Take 1 tablet (8.6 mg total) by mouth daily. 30 tablet 0   Vitamin E  670 MG (1000 UT) CAPS Take 1,000 Units by mouth in the morning.     Zinc  50 MG TABS Take 50 mg by mouth daily.     No current facility-administered medications for this visit.    REVIEW OF SYSTEMS  All other systems were reviewed and are negative     Objective:  Objective   There were no vitals filed for this visit. There is no height or weight on file to calculate BMI.  Physical Exam General: no acute distress Cardiac: hemodynamically stable Pulm: normal work of breathing Abdomen: non-tender, no pulsatile mass*** Neuro: alert, no focal deficit Extremities: no edema, cyanosis or  wounds*** Vascular:   Right: ***  Left: ***  Data: ABI from Janurary 2025 +---------+------------------+-----+-------------------+--------+  Right   Rt Pressure (mmHg)IndexWaveform           Comment   +---------+------------------+-----+-------------------+--------+  Brachial 131                                                 +---------+------------------+-----+-------------------+--------+  PTA                            absent             occluded  +---------+------------------+-----+-------------------+--------+  DP      38                0.29 dampened monophasic          +---------+------------------+-----+-------------------+--------+  Great Toe                       Absent                       +---------+------------------+-----+-------------------+--------+   +---------+------------------+-----+----------+----------+  Left    Lt Pressure (mmHg)IndexWaveform  Comment     +---------+------------------+-----+----------+----------+  Brachial                                  AV fistula  +---------+------------------+-----+----------+----------+  PTA     75                0.57 monophasic            +---------+------------------+-----+----------+----------+  DP      69                0.53 monophasic            +---------+------------------+-----+----------+----------+  Great Toe                       Abnormal              +---------+------------------+-----+----------+----------+  Angiogram from April 2025 reviewed 1: Abdominal aorta-fluoroscopically calcified, small in caliber but no significant atherosclerotic changes 2: Left lower extremity-50% left common femoral artery stenosis, diffuse 60 to 70% segmental proximal and mid left SFA stenosis.  Tandem 95% stenoses in the distal left SFA and proximal P1 segment.  Two-vessel runoff via the anterior tibial and peroneal.  Peroneal had a 75% segmental stenosis.  The  anterior tibial was a dominant vessel to the foot and was diffusely diseased 3: Right lower extremity-60 to 70% eccentric ostial right common iliac artery stenosis.  There was no gradient noted across this.  50 to 60% calcified right common femoral artery stenosis.  60 to 70% segmental proximal and mid right SFA stenosis.  95% focal mid right SFA stenosis, 60 to 70% segmental distal right SFA stenosis with 0 vessel runoff.     Assessment/Plan:   DORISMAR CHAY is a 83 y.o. 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 ***     Norman GORMAN Serve MD [facility]

## 2024-05-26 ENCOUNTER — Encounter: Payer: Self-pay | Admitting: Vascular Surgery

## 2024-05-26 ENCOUNTER — Ambulatory Visit: Attending: Vascular Surgery | Admitting: Vascular Surgery

## 2024-05-26 VITALS — BP 113/70 | HR 90

## 2024-05-26 DIAGNOSIS — I5020 Unspecified systolic (congestive) heart failure: Secondary | ICD-10-CM

## 2024-05-26 DIAGNOSIS — I70221 Atherosclerosis of native arteries of extremities with rest pain, right leg: Secondary | ICD-10-CM

## 2024-05-26 DIAGNOSIS — N186 End stage renal disease: Secondary | ICD-10-CM | POA: Diagnosis not present

## 2024-05-26 DIAGNOSIS — I739 Peripheral vascular disease, unspecified: Secondary | ICD-10-CM

## 2024-05-26 MED ORDER — TRAMADOL HCL 50 MG PO TABS: 50.0000 mg | ORAL_TABLET | Freq: Four times a day (QID) | ORAL | 0 refills | Status: DC | PRN

## 2024-05-31 ENCOUNTER — Inpatient Hospital Stay: Attending: Hematology and Oncology

## 2024-05-31 VITALS — BP 136/54 | HR 81 | Temp 97.9°F | Resp 20

## 2024-05-31 DIAGNOSIS — D500 Iron deficiency anemia secondary to blood loss (chronic): Secondary | ICD-10-CM | POA: Insufficient documentation

## 2024-05-31 DIAGNOSIS — D631 Anemia in chronic kidney disease: Secondary | ICD-10-CM

## 2024-05-31 MED ORDER — LORATADINE 10 MG PO TABS: 10.0000 mg | ORAL_TABLET | Freq: Once | ORAL | Status: DC

## 2024-05-31 MED ORDER — SODIUM CHLORIDE 0.9 % IV SOLN: INTRAVENOUS | Status: DC

## 2024-05-31 MED ORDER — SODIUM CHLORIDE 0.9 % IV SOLN
510.0000 mg | Freq: Once | INTRAVENOUS | Status: AC
  Administered 2024-05-31: 510 mg via INTRAVENOUS
  Filled 2024-05-31: qty 510

## 2024-05-31 MED ORDER — ACETAMINOPHEN 325 MG PO TABS: 650.0000 mg | ORAL_TABLET | Freq: Once | ORAL | Status: DC

## 2024-05-31 NOTE — Patient Instructions (Signed)

## 2024-06-01 ENCOUNTER — Encounter: Payer: Self-pay | Admitting: Hematology and Oncology

## 2024-06-05 ENCOUNTER — Telehealth: Payer: Self-pay | Admitting: Cardiology

## 2024-06-05 NOTE — Progress Notes (Signed)
 06/05/24 HN received notification that patient went to [facility] ED on 2024/06/09 for cardiac arrest, ESRD. HN reviewed ED note and patient expired. No HN services warranted at this time per CHESS protocol.   Jon Sharps, RN [facility] [phone]

## 2024-06-05 NOTE — Telephone Encounter (Signed)
 Dr. Krasowski and Delon Hoover.  Patients daughter called to let us  know her mom passed away on 05-Jun-2024. She was at dialysis and started to not feel good, then she passed out. They tried to revive her but could not. Daughter wanted both of you and the staff to know how much she appreciated the care that you gave her mother.

## 2024-06-05 NOTE — Telephone Encounter (Signed)
 FYI...  Patient name  Anna Carr  Patient MRN  [id number] Patient DOB  12-Mar-1941 Patient DOD  06-08-24  Notified by [facility]. Please mark chart accordingly.  Thank you,  Arland Hoit [address] [place], [place] [address] [email or web address]  (link: [email or web address] [phone]

## 2024-06-05 NOTE — Progress Notes (Signed)
 Thanks

## 2024-06-05 NOTE — Telephone Encounter (Signed)
 Copied from CRM #[id number]. Topic: Deceased Patient - Deceased Patient [place] >> 2024-06-06  7:53 AM Alisa DEL wrote: Patients daughter states the patient passed away on Saturday, 06-04-24, all appointments cancelled

## 2024-06-21 ENCOUNTER — Ambulatory Visit: Admitting: Cardiology

## 2024-06-26 DEATH — deceased

## 2024-06-28 ENCOUNTER — Other Ambulatory Visit

## 2024-06-28 ENCOUNTER — Ambulatory Visit: Admitting: Hematology and Oncology

## 2024-07-13 ENCOUNTER — Other Ambulatory Visit: Payer: Self-pay
